# Patient Record
Sex: Male | Born: 1945
Health system: Southern US, Community
[De-identification: ages and names within clinical notes are randomized; demographics above are authoritative.]

## PROBLEM LIST (undated history)

## (undated) DIAGNOSIS — I10 Essential (primary) hypertension: Secondary | ICD-10-CM

## (undated) DIAGNOSIS — S32010A Wedge compression fracture of first lumbar vertebra, initial encounter for closed fracture: Secondary | ICD-10-CM

## (undated) DIAGNOSIS — L814 Other melanin hyperpigmentation: Secondary | ICD-10-CM

## (undated) DIAGNOSIS — T7840XA Allergy, unspecified, initial encounter: Secondary | ICD-10-CM

## (undated) DIAGNOSIS — L570 Actinic keratosis: Secondary | ICD-10-CM

## (undated) DIAGNOSIS — R569 Unspecified convulsions: Secondary | ICD-10-CM

## (undated) DIAGNOSIS — B019 Varicella without complication: Secondary | ICD-10-CM

## (undated) DIAGNOSIS — S22000A Wedge compression fracture of unspecified thoracic vertebra, initial encounter for closed fracture: Secondary | ICD-10-CM

## (undated) HISTORY — DX: Actinic keratosis: L57.0

## (undated) HISTORY — PX: COLONOSCOPY: SHX174

## (undated) HISTORY — DX: Unspecified convulsions: R56.9

## (undated) HISTORY — DX: Allergy, unspecified, initial encounter: T78.40XA

## (undated) HISTORY — DX: Other melanin hyperpigmentation: L81.4

## (undated) HISTORY — DX: Essential (primary) hypertension: I10

## (undated) HISTORY — DX: Wedge compression fracture of first lumbar vertebra, initial encounter for closed fracture: S32.010A

## (undated) HISTORY — PX: OTHER SURGICAL HISTORY: SHX169

## (undated) HISTORY — DX: Wedge compression fracture of unspecified thoracic vertebra, initial encounter for closed fracture: S22.000A

## (undated) HISTORY — DX: Varicella without complication: B01.9

---

## 2009-11-28 ENCOUNTER — Ambulatory Visit: Payer: Self-pay | Admitting: Gastroenterology

## 2012-02-03 ENCOUNTER — Emergency Department: Payer: Self-pay | Admitting: Emergency Medicine

## 2012-02-03 LAB — URINALYSIS, COMPLETE
Glucose,UR: NEGATIVE mg/dL (ref 0–75)
Ketone: NEGATIVE
Leukocyte Esterase: NEGATIVE
Nitrite: NEGATIVE
Ph: 5 (ref 4.5–8.0)
Protein: NEGATIVE
Specific Gravity: 1.015 (ref 1.003–1.030)
WBC UR: <1 /HPF (ref 0–5)

## 2012-02-03 LAB — COMPREHENSIVE METABOLIC PANEL
Albumin: 3.9 g/dL (ref 3.4–5.0)
Anion Gap: 7 (ref 7–16)
BUN: 18 mg/dL (ref 7–18)
Bilirubin,Total: 0.5 mg/dL (ref 0.2–1.0)
Chloride: 102 mmol/L (ref 98–107)
Co2: 27 mmol/L (ref 21–32)
Creatinine: 0.9 mg/dL (ref 0.60–1.30)
EGFR (African American): >60
Potassium: 3.8 mmol/L (ref 3.5–5.1)
SGPT (ALT): 31 U/L (ref 12–78)
Sodium: 136 mmol/L (ref 136–145)
Total Protein: 6.7 g/dL (ref 6.4–8.2)

## 2012-02-03 LAB — CBC
Platelet: 211 10*3/uL (ref 150–440)
RBC: 4.42 10*6/uL (ref 4.40–5.90)
RDW: 12.8 % (ref 11.5–14.5)
WBC: 5.7 10*3/uL (ref 3.8–10.6)

## 2015-09-16 ENCOUNTER — Observation Stay (HOSPITAL_COMMUNITY): Payer: Medicare Other

## 2015-09-16 ENCOUNTER — Observation Stay (HOSPITAL_COMMUNITY)
Admission: EM | Admit: 2015-09-16 | Discharge: 2015-09-16 | Disposition: A | Discharge status: Home / Self Care | Payer: Medicare Other | Attending: Emergency Medicine | Admitting: Emergency Medicine

## 2015-09-16 ENCOUNTER — Encounter (HOSPITAL_COMMUNITY): Payer: Self-pay | Admitting: *Deleted

## 2015-09-16 ENCOUNTER — Emergency Department (HOSPITAL_COMMUNITY): Payer: Medicare Other

## 2015-09-16 DIAGNOSIS — IMO0002 Reserved for concepts with insufficient information to code with codable children: Secondary | ICD-10-CM

## 2015-09-16 DIAGNOSIS — Z79899 Other long term (current) drug therapy: Secondary | ICD-10-CM | POA: Diagnosis not present

## 2015-09-16 DIAGNOSIS — R569 Unspecified convulsions: Principal | ICD-10-CM | POA: Insufficient documentation

## 2015-09-16 DIAGNOSIS — Z7982 Long term (current) use of aspirin: Secondary | ICD-10-CM | POA: Insufficient documentation

## 2015-09-16 DIAGNOSIS — G40901 Epilepsy, unspecified, not intractable, with status epilepticus: Secondary | ICD-10-CM | POA: Diagnosis present

## 2015-09-16 LAB — I-STAT CHEM 8, ED
BUN: 16 mg/dL (ref 6–20)
Calcium, Ion: 1.14 mmol/L (ref 1.12–1.23)
Chloride: 103 mmol/L (ref 101–111)
Creatinine, Ser: 1.2 mg/dL (ref 0.61–1.24)
GLUCOSE: 172 mg/dL — AB (ref 65–99)
HEMATOCRIT: 48 % (ref 39.0–52.0)
HEMOGLOBIN: 16.3 g/dL (ref 13.0–17.0)
POTASSIUM: 3.9 mmol/L (ref 3.5–5.1)
SODIUM: 139 mmol/L (ref 135–145)
TCO2: 11 mmol/L (ref 0–100)

## 2015-09-16 LAB — COMPREHENSIVE METABOLIC PANEL
ALT: 35 U/L (ref 17–63)
ANION GAP: 23 — AB (ref 5–15)
AST: 39 U/L (ref 15–41)
Albumin: 4.2 g/dL (ref 3.5–5.0)
Alkaline Phosphatase: 85 U/L (ref 38–126)
BUN: 16 mg/dL (ref 6–20)
CHLORIDE: 102 mmol/L (ref 101–111)
CO2: 9 mmol/L — AB (ref 22–32)
CREATININE: 1.34 mg/dL — AB (ref 0.61–1.24)
Calcium: 9 mg/dL (ref 8.9–10.3)
GFR calc non Af Amer: 52 mL/min — ABNORMAL LOW (ref >60)
GFR, EST AFRICAN AMERICAN: 60 mL/min — AB (ref >60)
Glucose, Bld: 174 mg/dL — ABNORMAL HIGH (ref 65–99)
Potassium: 3.8 mmol/L (ref 3.5–5.1)
SODIUM: 134 mmol/L — AB (ref 135–145)
Total Bilirubin: 0.9 mg/dL (ref 0.3–1.2)
Total Protein: 7 g/dL (ref 6.5–8.1)

## 2015-09-16 LAB — URINE MICROSCOPIC-ADD ON: Squamous Epithelial / LPF: NONE SEEN

## 2015-09-16 LAB — CBC WITH DIFFERENTIAL/PLATELET
Basophils Absolute: 0.1 10*3/uL (ref 0.0–0.1)
Basophils Relative: 1 %
Eosinophils Absolute: 0.4 10*3/uL (ref 0.0–0.7)
Eosinophils Relative: 4 %
HEMATOCRIT: 46.2 % (ref 39.0–52.0)
HEMOGLOBIN: 15.5 g/dL (ref 13.0–17.0)
LYMPHS ABS: 4.2 10*3/uL — AB (ref 0.7–4.0)
Lymphocytes Relative: 41 %
MCH: 32.8 pg (ref 26.0–34.0)
MCHC: 33.5 g/dL (ref 30.0–36.0)
MCV: 97.9 fL (ref 78.0–100.0)
MONOS PCT: 9 %
Monocytes Absolute: 0.9 10*3/uL (ref 0.1–1.0)
NEUTROS ABS: 4.8 10*3/uL (ref 1.7–7.7)
NEUTROS PCT: 45 %
Platelets: 266 10*3/uL (ref 150–400)
RBC: 4.72 MIL/uL (ref 4.22–5.81)
RDW: 12.8 % (ref 11.5–15.5)
WBC: 10.4 10*3/uL (ref 4.0–10.5)

## 2015-09-16 LAB — URINALYSIS, ROUTINE W REFLEX MICROSCOPIC
BILIRUBIN URINE: NEGATIVE
Glucose, UA: NEGATIVE mg/dL
Ketones, ur: NEGATIVE mg/dL
Leukocytes, UA: NEGATIVE
Nitrite: NEGATIVE
PROTEIN: 100 mg/dL — AB
Specific Gravity, Urine: 1.025 (ref 1.005–1.030)
pH: 5 (ref 5.0–8.0)

## 2015-09-16 LAB — RAPID URINE DRUG SCREEN, HOSP PERFORMED
Amphetamines: NOT DETECTED
BARBITURATES: NOT DETECTED
Benzodiazepines: POSITIVE — AB
Cocaine: NOT DETECTED
Opiates: NOT DETECTED
TETRAHYDROCANNABINOL: NOT DETECTED

## 2015-09-16 LAB — PROTIME-INR
INR: 1.2 (ref 0.00–1.49)
Prothrombin Time: 15.4 seconds — ABNORMAL HIGH (ref 11.6–15.2)

## 2015-09-16 LAB — CBG MONITORING, ED: GLUCOSE-CAPILLARY: 186 mg/dL — AB (ref 65–99)

## 2015-09-16 MED ORDER — NAPROXEN 500 MG PO TABS: 500.0000 mg | ORAL_TABLET | Freq: Two times a day (BID) | ORAL | Status: DC

## 2015-09-16 MED ORDER — HYDROCODONE-ACETAMINOPHEN 5-325 MG PO TABS: 1.0000 | ORAL_TABLET | Freq: Four times a day (QID) | ORAL | Status: DC | PRN

## 2015-09-16 MED ORDER — IOPAMIDOL (ISOVUE-370) INJECTION 76%
INTRAVENOUS | Status: AC
  Administered 2015-09-16: 50 mL
  Filled 2015-09-16: qty 50

## 2015-09-16 MED ORDER — ETOMIDATE 2 MG/ML IV SOLN
10.0000 mg | Freq: Once | INTRAVENOUS | Status: AC
Start: 2015-09-16 — End: 2015-09-16
  Administered 2015-09-16: 10 mg via INTRAVENOUS

## 2015-09-16 MED ORDER — LEVETIRACETAM 500 MG PO TABS: 500.0000 mg | ORAL_TABLET | Freq: Two times a day (BID) | ORAL | Status: DC

## 2015-09-16 MED ORDER — LORAZEPAM 2 MG/ML IJ SOLN
1.0000 mg | Freq: Once | INTRAMUSCULAR | Status: AC
  Administered 2015-09-16: 1 mg via INTRAVENOUS

## 2015-09-16 MED ORDER — FENTANYL CITRATE (PF) 100 MCG/2ML IJ SOLN
50.0000 ug | Freq: Once | INTRAMUSCULAR | Status: AC
  Administered 2015-09-16: 50 ug via INTRAVENOUS
  Filled 2015-09-16: qty 2

## 2015-09-16 MED ORDER — GADOBENATE DIMEGLUMINE 529 MG/ML IV SOLN
19.0000 mL | Freq: Once | INTRAVENOUS | Status: AC | PRN
  Administered 2015-09-16: 19 mL via INTRAVENOUS

## 2015-09-16 MED ORDER — SODIUM CHLORIDE 0.9 % IV SOLN
1000.0000 mg | Freq: Two times a day (BID) | INTRAVENOUS | Status: DC
  Administered 2015-09-16: 1000 mg via INTRAVENOUS
  Filled 2015-09-16 (×2): qty 10

## 2015-09-16 MED ORDER — LORAZEPAM 2 MG/ML IJ SOLN
INTRAMUSCULAR | Status: AC
  Filled 2015-09-16: qty 1

## 2015-09-16 NOTE — ED Provider Notes (Addendum)
CSN: [id number]     Arrival date & time 09/16/15  0906 History   First MD Initiated Contact with Patient 09/16/15 [phone]     Chief Complaint  Patient presents with  . Seizures     (Consider location/radiation/quality/duration/timing/severity/associated sxs/prior Treatment) HPI Comments: LEVEL 5 CAVEAT FOR ALTERED MENTAL STATUS / UNRESPONSIVE  Pt brought to the ER with cc of unresponsive. Per EMS, they were called to the scene after patient fell down and had a seizure like episode. When they arrived pt was verbally responsive, but altered. Per EMS, there was 1 min episode of generalized tonic clonic seizure which stopped spontaneously. Pt was given 2.5 mg versed.  Care everywhere shows primary dx of brain AN at [facility] as a diagnosis - but there is no note in regards to that diagnosis or an image we can confirm the diagnosis with.   Patient is a 70 y.o. male presenting with seizures. The history is limited by the condition of the patient.  Seizures Seizure activity on arrival: no     History reviewed. No pertinent past medical history. No past surgical history on file. No family history on file. Social History  Substance Use Topics  . Smoking status: None  . Smokeless tobacco: None  . Alcohol Use: None    Review of Systems  Unable to perform ROS: Patient unresponsive  Neurological: Positive for seizures.      Allergies  Other  Home Medications   Prior to Admission medications   Medication Sig Start Date End Date Taking? Authorizing Provider  amLODipine (NORVASC) 5 MG tablet Take 5 mg by mouth daily. 09/16/15  Yes Historical Provider, MD  aspirin EC 81 MG tablet Take 81 mg by mouth daily.   Yes Historical Provider, MD  Multiple Vitamin (MULTIVITAMIN WITH MINERALS) TABS tablet Take 1 tablet by mouth See admin instructions. Take 1 tablet by mouth every 2-3 days   Yes Historical Provider, MD   BP 142/91 mmHg  Pulse 97  Temp(Src) 97.9 F (36.6 C) (Rectal)  Resp 26  Wt 190  lb (86.183 kg)  SpO2 95% Physical Exam  Constitutional: He appears well-developed.  HENT:  Head: Atraumatic.  Eyes: Conjunctivae are normal.  Neck: Neck supple.  Cardiovascular:  tachycardia  Pulmonary/Chest: Effort normal.  Abdominal: Soft.  Neurological:  Not following commands GCS - (e/v/m = 1/2/5) = 7. Protecting airway - mumbling   Skin: Skin is warm.  Nursing note and vitals reviewed.   ED Course  .Critical Care Performed by: Varney Biles Authorized by: Varney Biles Total critical care time: 40 minutes Critical care time was exclusive of separately billable procedures and treating other patients. Critical care was necessary to treat or prevent imminent or life-threatening deterioration of the following conditions: CNS failure or compromise. Critical care was time spent personally by me on the following activities: discussions with consultants, development of treatment plan with patient or surrogate, evaluation of patient's response to treatment, examination of patient, obtaining history from patient or surrogate, ordering and performing treatments and interventions, ordering and review of laboratory studies, ordering and review of radiographic studies, pulse oximetry, re-evaluation of patient's condition and review of old charts.   (including critical care time) Labs Review Labs Reviewed  COMPREHENSIVE METABOLIC PANEL - Abnormal; Notable for the following:    Sodium 134 (*)    CO2 9 (*)    Glucose, Bld 174 (*)    Creatinine, Ser 1.34 (*)    GFR calc non Af Amer 52 (*)  GFR calc Af Amer 60 (*)    Anion gap 23 (*)    All other components within normal limits  CBC WITH DIFFERENTIAL/PLATELET - Abnormal; Notable for the following:    Lymphs Abs 4.2 (*)    All other components within normal limits  PROTIME-INR - Abnormal; Notable for the following:    Prothrombin Time 15.4 (*)    All other components within normal limits  I-STAT CHEM 8, ED - Abnormal; Notable  for the following:    Glucose, Bld 172 (*)    All other components within normal limits  CBG MONITORING, ED - Abnormal; Notable for the following:    Glucose-Capillary 186 (*)    All other components within normal limits  URINE RAPID DRUG SCREEN, HOSP PERFORMED  URINALYSIS, ROUTINE W REFLEX MICROSCOPIC (NOT AT [facility])    Imaging Review Ct Angio Head W/cm &/or Wo Cm  09/16/2015  CLINICAL DATA:  New onset seizure.  Altered mental status. EXAM: CT ANGIOGRAPHY HEAD TECHNIQUE: Multidetector CT imaging of the head was performed using the standard protocol during bolus administration of intravenous contrast. Multiplanar CT image reconstructions and MIPs were obtained to evaluate the vascular anatomy. CONTRAST:  50 mL Isovue 370 COMPARISON:  CT head without contrast from the same day. FINDINGS: CT HEAD Brain: An age indeterminate lacunar infarct is present in the right thalamus. No other acute cortical infarct is present. Mild periventricular white matter hypoattenuation is present bilaterally. Calvarium and skull base: The calvarium is within normal limits. Skullbase is unremarkable. Paranasal sinuses: Mild mucosal thickening is present in the maxillary sinuses, worse on the right. The remaining paranasal sinuses and the mastoid air cells are clear. Orbits: The globes and orbits are intact. CTA HEAD Anterior circulation: Minimal atherosclerotic calcifications are present within the cavernous internal carotid arteries bilaterally. There is no significant stenosis. No focal aneurysm is present. The ICA termini are within normal limits bilaterally. The A1 and M1 segments are normal. The anterior communicating artery is patent. The MCA bifurcations are within normal limits bilaterally. ACA and MCA branch vessels are unremarkable. Posterior circulation: The vertebral arteries are codominant. The PICA origins are visualized and normal. The basilar artery is within normal limits. Both posterior cerebral arteries  originate from the basilar tip. A prominent right posterior communicating artery is present as well. PCA branch vessels are within normal limits bilaterally. Venous sinuses: The dural sinuses are patent. The right transverse sinus is dominant. Straight sinus and deep cerebral veins are intact. Cortical veins are unremarkable. Anatomic variants: A prominent right posterior communicating artery contributes to the right PCA. Delayed phase:The postcontrast images demonstrate no pathologic enhancement. IMPRESSION: 1. Age indeterminate lacunar infarct of the right thalamus. 2. Stable mild age advanced white matter disease likely reflects the sequela of chronic microvascular ischemia. 3. Normal variant CTA circle of Willis without significant proximal stenosis, aneurysm, or branch vessel occlusion. Electronically Signed   By: San Morelle M.D.   On: 09/16/2015 11:30   Ct Head Wo Contrast  09/16/2015  CLINICAL DATA:  New onset seizure. EXAM: CT HEAD WITHOUT CONTRAST TECHNIQUE: Contiguous axial images were obtained from the base of the skull through the vertex without intravenous contrast. COMPARISON:  CT head without contrast 02/03/2012 and CTA head from the same day. FINDINGS: An age indeterminate lacunar infarct is present in the right thalamus. Mild periventricular white matter hypoattenuation is otherwise stable. The basal ganglia are otherwise intact. The insular ribbon is within normal limits bilaterally. No other focal cortical lesions are present.  No acute hemorrhage or mass lesion is present. Mild atrophy is within normal limits for age. The ventricles are proportionate to the degree of atrophy. No significant extra-axial fluid collection is present. A right nasal airway is in place. The calvarium is intact. The skullbase is otherwise normal. No significant extracranial soft tissue lesions are present. The globes and orbits are within normal limits. IMPRESSION: 1. Age indeterminate nonhemorrhagic infarct  of the right thalamus. 2. Stable mild age advanced white matter disease likely reflects the sequela of chronic microvascular ischemia. 3. No other acute intracranial abnormality. 4. Mild atrophy is within normal limits for age. Electronically Signed   By: San Morelle M.D.   On: 09/16/2015 11:32   I have personally reviewed and evaluated these images and lab results as part of my medical decision-making.   EKG Interpretation   Date/Time:  Saturday September 16 2015 09:11:41 EDT Ventricular Rate:  112 PR Interval:    QRS Duration: 109 QT Interval:  336 QTC Calculation: 459 R Axis:   -29 Text Interpretation:  Sinus tachycardia Multiform ventricular premature  complexes Borderline left axis deviation No acute changes No old tracing  to compare Confirmed by Glenroy Crossen, MD, Thelma Comp [phone]) on 09/16/2015 9:21:08  AM      MDM   Final diagnoses:  Status epilepticus ([place])    Pt unresponsive, witnessed seizure x 2 that happened post fall. Pt is status. ? Bleed or stroke. Brain AN listed in family hx for sure. We will get Ct angio head and reassess. Close monitoring for now.  @12 :00: CT -A is neg. Son and bedside and pt more awake. Looks like pt has had some partial seizure like symptoms in the past. Neurology recommends admission, and they will see the patient before making any recommendations.     Varney Biles, MD 09/16/15 [place], MD 09/16/15 1306

## 2015-09-16 NOTE — Discharge Instructions (Signed)
See the Neurologist as requested. EEG still needs to be completed. Take the keppra as prescribed. No driving for 6 months.  You also have compression fracture of the back. Please wear the brace. Please take the meds prescribed for pain. See your doctor, or back specialist (see referral) for follow up - contact your doctors office this Monday to discuss follow up plan with them.   MRI IMPRESSION: No acute intracranial process.  Mild to moderate white matter changes compatible chronic small vessel ischemic disease. 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        Epilepsy Epilepsy is a disorder in which a person has repeated seizures over time. A seizure is a release of abnormal electrical activity in the brain. Seizures can cause a change in attention, behavior, or the ability to remain awake and alert (altered mental status). Seizures often involve uncontrollable shaking (convulsions).  Most people with epilepsy lead normal lives. However, people with epilepsy are at an increased risk of falls, accidents, and injuries. Therefore, it is important to begin treatment right away. CAUSES  Epilepsy has many possible causes. Anything that disturbs the normal pattern of brain cell activity can lead to seizures. This may include:   Head injury.  Birth trauma.  High fever as a child.  Stroke.  Bleeding into or around the brain.  Certain drugs.  Prolonged low oxygen, such as what occurs after CPR efforts.  Abnormal brain development.  Certain illnesses, such as meningitis, encephalitis (brain infection), malaria, and other infections.  An imbalance of nerve signaling chemicals (neurotransmitters).  SIGNS AND SYMPTOMS  The symptoms of a seizure can vary greatly from one person to another. Right before a seizure, you may have a warning (aura) that a seizure is about to occur. An aura may include the following symptoms:  Fear or anxiety.  Nausea.  Feeling like  the room is spinning (vertigo).  Vision changes, such as seeing flashing lights or spots. Common symptoms during a seizure include:  Abnormal sensations, such as an abnormal smell or a bitter taste in the mouth.   Sudden, general body stiffness.   Convulsions that involve rhythmic jerking of the face, arm, or leg on one or both sides.   Sudden change in consciousness.   Appearing to be awake but not responding.   Appearing to be asleep but cannot be awakened.   Grimacing, chewing, lip smacking, drooling, tongue biting, or loss of bowel or bladder control. After a seizure, you may feel sleepy for a while. DIAGNOSIS  Your health care provider will ask about your symptoms and take a medical history. Descriptions from any witnesses to your seizures will be very helpful in the diagnosis. A physical exam, including a detailed neurological exam, is necessary. Various tests may be done, such as:   An electroencephalogram (EEG). This is a painless test of your brain waves. In this test, a diagram is created of your brain waves. These diagrams can be interpreted by a specialist.  An MRI of the brain.   A CT scan of the brain.   A spinal tap (lumbar puncture, LP).  Blood tests to check for signs of infection or abnormal blood chemistry. TREATMENT  There is no cure for epilepsy, but it is generally treatable. Once epilepsy is diagnosed, it is important to begin treatment as soon as possible. For most people with epilepsy, seizures can be controlled with medicines. The following may also be used:  A pacemaker for the brain (vagus nerve  stimulator) can be used for people with seizures that are not well controlled by medicine.  Surgery on the brain. For some people, epilepsy eventually goes away. HOME CARE INSTRUCTIONS   Follow your health care provider's recommendations on driving and safety in normal activities.  Get enough rest. Lack of sleep can cause seizures.  Only take  over-the-counter or prescription medicines as directed by your health care provider. Take any prescribed medicine exactly as directed.  Avoid any known triggers of your seizures.  Keep a seizure diary. Record what you recall about any seizure, especially any possible trigger.   Make sure the people you live and work with know that you are prone to seizures. They should receive instructions on how to help you. In general, a witness to a seizure should:   Cushion your head and body.   Turn you on your side.   Avoid unnecessarily restraining you.   Not place anything inside your mouth.   Call for emergency medical help if there is any question about what has occurred.   Follow up with your health care provider as directed. You may need regular blood tests to monitor the levels of your medicine.  SEEK MEDICAL CARE IF:   You develop signs of infection or other illness. This might increase the risk of a seizure.   You seem to be having more frequent seizures.   Your seizure pattern is changing.  SEEK IMMEDIATE MEDICAL CARE IF:   You have a seizure that does not stop after a few moments.   You have a seizure that causes any difficulty in breathing.   You have a seizure that results in a very severe headache.   You have a seizure that leaves you with the inability to speak or use a part of your body.    This information is not intended to replace advice given to you by your health care provider. Make sure you discuss any questions you have with your health care provider.   Document Released: 02/18/2005 Document Revised: 12/09/2012 Document Reviewed: 09/30/2012 Elsevier Interactive Patient Education 2016 [place]. Spinal Compression Fracture A spinal compression fracture is a collapse of the bones that form the spine (vertebrae). With this type of fracture, the vertebrae become squashed (compressed) into a wedge shape. Most compression fractures happen in the  middle or lower part of the spine. CAUSES This condition may be caused by:  Thinning and loss of density in the bones (osteoporosis). This is the most common cause.  A fall.  A car or motorcycle accident.  Cancer.  Trauma, such as a heavy, direct hit to the head. RISK FACTORS You may be at greater risk for a spinal compression fracture if you:  Are 32 years old or older.  Have osteoporosis.  Have certain types of cancer, including:  Multiple myeloma.  Lymphoma.  Prostate cancer.  Lung cancer.  Breast cancer. SYMPTOMS Symptoms of this condition include:  Severe pain.  Pain that gets worse over time.  Pain that is worse when you stand, walk, sit, or bend.  Sudden pain that is so bad that it is hard for you to move.  Bending or humping of the spine.  Gradual loss of height.  Numbness, tingling, or weakness in the back and legs.  Trouble walking. Your symptoms will depend on the cause of the fracture and how quickly it develops. For example, fractures that are caused by osteoporosis can cause few symptoms, no symptoms, or symptoms that develop slowly over  time. DIAGNOSIS This condition may be diagnosed based on symptoms, medical history, and a physical exam. During the physical exam, your health care provider may tap along the length of your spine to check for tenderness. Tests may be done to confirm the diagnosis. They may include:  A bone density test to check for osteoporosis.  Imaging tests, such as a spine X-ray, a CT scan, or MRI. TREATMENT Treatment for this condition depends on the cause and severity of the condition.Some fractures, such as those that are caused by osteoporosis, may heal on their own with supportive care. This may include:  Pain medicine.  Rest.  A back brace.  Physical therapy exercises.  Medicine that reduces bone pain.  Calcium and vitamin D supplements. Fractures that cause the back to become misshapen, cause nerve pain  or weakness, or do not respond to other treatment may be treated with a surgical procedure, such as:  Vertebroplasty. In this procedure, bone cement is injected into the collapsed vertebrae to stabilize them.  Balloon kyphoplasty. In this procedure, the collapsed vertebrae are expanded with a balloon and then bone cement is injected into them.  Spinal fusion. In this procedure, the collapsed vertebrae are connected (fused) to normal vertebrae. HOME CARE INSTRUCTIONS General Instructions  Take medicines only as directed by your health care provider.  Do not drive or operate heavy machinery while taking pain medicine.  If directed, apply ice to the injured area:  Put ice in a plastic bag.  Place a towel between your skin and the bag.  Leave the ice on for 30 minutes every two hours at first. Then apply the ice as needed.  Wear your neck brace or back brace as directed by your health care provider.  Do not drink alcohol. Alcohol can interfere with your treatment.  Keep all follow-up visits as directed by your health care provider. This is important. It can help to prevent permanent injury, disability, and long-lasting (chronic) pain. Activity  Stay in bed (on bed rest) only as directed by your health care provider. Being on bed rest for too long can make your condition worse.  Return to your normal activities as directed by your health care provider. Ask what activities are safe for you.  Do exercises to improve motion and strength in your back (physical therapy), as recommended by your health care provider.  Exercise regularly as directed by your health care provider. SEEK MEDICAL CARE IF:  You have a fever.  You develop a cough that makes your pain worse.  Your pain medicine is not helping.  Your pain does not get better over time.  You cannot return to your normal activities as planned or expected. SEEK IMMEDIATE MEDICAL CARE IF:  Your pain is very bad and it suddenly  gets worse.  You are unable to move any body part (paralysis) that is below the level of your injury.  You have numbness, tingling, or weakness in any body part that is below the level of your injury.  You cannot control your bladder or bowels.   This information is not intended to replace advice given to you by your health care provider. Make sure you discuss any questions you have with your health care provider.   Document Released: 02/18/2005 Document Revised: 07/05/2014 Document Reviewed: 02/22/2014 Elsevier Interactive Patient Education [employer].
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
## 2015-09-16 NOTE — ED Notes (Signed)
Pt assisted to side of bed, became nauseous and diaphoretic. Symptoms subsided after couple minutes. Declines need for nausea medication. Pt and family also report missing shoes and a shirt, security contacted for lost items report
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
## 2015-09-16 NOTE — ED Notes (Signed)
Family requesting an orthopedic or neurosugery referral for compression fractures; Spoke to Dr.Steinl who gave referral

## 2015-09-16 NOTE — ED Notes (Signed)
Neuro rounding in pt's room.

## 2015-09-20 ENCOUNTER — Telehealth: Payer: Self-pay | Admitting: Neurology

## 2015-09-20 NOTE — Telephone Encounter (Signed)
LFt vm for patients son that per Dr. Leonie Man he would have to contact his PCP for pain management. Pt was given 8 pills of norco on 09/16/2015. Pt has appt tomorrow at 0400pm for new pt appt.

## 2015-09-20 NOTE — Telephone Encounter (Signed)
Pt's son, Collene Leyden requesting refill on HYDROcodone-acetaminophen (NORCO/VICODIN) 5-325 MG tablet in order to make the drive in for appt tomorrow. Please call and advise

## 2015-09-21 ENCOUNTER — Encounter: Payer: Self-pay | Admitting: Neurology

## 2015-09-21 ENCOUNTER — Ambulatory Visit (INDEPENDENT_AMBULATORY_CARE_PROVIDER_SITE_OTHER): Payer: Medicare Other | Admitting: Neurology

## 2015-09-21 VITALS — BP 128/95 | HR 92 | Ht 73.0 in | Wt 205.6 lb

## 2015-09-21 DIAGNOSIS — G40209 Localization-related (focal) (partial) symptomatic epilepsy and epileptic syndromes with complex partial seizures, not intractable, without status epilepticus: Secondary | ICD-10-CM | POA: Diagnosis not present

## 2015-09-21 NOTE — Patient Instructions (Signed)
I had a long discussion with the patient and his son regarding his recent episodes of 2 generalized seizures, discussed risk for seizure recurrence and answered questions. I recommend he stay on Keppra 500 mg twice daily. If he has breakthrough complex partial seizures may need to consider increasing the dose. I have discussed possible side effects and advised him to call me if needed. We will follow EEG results from study done today. Patient was counseled not to drive for the next 3-6 months as per [facility] and to avoid seizure provoking stimuli like sleep deprivation, extremes of physical activity and changes in diet. He was advised to follow-up with the pain management doctor for his pain and return for follow-up in 3 months  Epilepsy Epilepsy is a disorder in which a person has repeated seizures over time. A seizure is a release of abnormal electrical activity in the brain. Seizures can cause a change in attention, behavior, or the ability to remain awake and alert (altered mental status). Seizures often involve uncontrollable shaking (convulsions).  Most people with epilepsy lead normal lives. However, people with epilepsy are at an increased risk of falls, accidents, and injuries. Therefore, it is important to begin treatment right away. CAUSES  Epilepsy has many possible causes. Anything that disturbs the normal pattern of brain cell activity can lead to seizures. This may include:   Head injury.  Birth trauma.  High fever as a child.  Stroke.  Bleeding into or around the brain.  Certain drugs.  Prolonged low oxygen, such as what occurs after CPR efforts.  Abnormal brain development.  Certain illnesses, such as meningitis, encephalitis (brain infection), malaria, and other infections.  An imbalance of nerve signaling chemicals (neurotransmitters).  SIGNS AND SYMPTOMS  The symptoms of a seizure can vary greatly from one person to another. Right before a seizure, you may  have a warning (aura) that a seizure is about to occur. An aura may include the following symptoms:  Fear or anxiety.  Nausea.  Feeling like the room is spinning (vertigo).  Vision changes, such as seeing flashing lights or spots. Common symptoms during a seizure include:  Abnormal sensations, such as an abnormal smell or a bitter taste in the mouth.   Sudden, general body stiffness.   Convulsions that involve rhythmic jerking of the face, arm, or leg on one or both sides.   Sudden change in consciousness.   Appearing to be awake but not responding.   Appearing to be asleep but cannot be awakened.   Grimacing, chewing, lip smacking, drooling, tongue biting, or loss of bowel or bladder control. After a seizure, you may feel sleepy for a while. DIAGNOSIS  Your health care provider will ask about your symptoms and take a medical history. Descriptions from any witnesses to your seizures will be very helpful in the diagnosis. A physical exam, including a detailed neurological exam, is necessary. Various tests may be done, such as:   An electroencephalogram (EEG). This is a painless test of your brain waves. In this test, a diagram is created of your brain waves. These diagrams can be interpreted by a specialist.  An MRI of the brain.   A CT scan of the brain.   A spinal tap (lumbar puncture, LP).  Blood tests to check for signs of infection or abnormal blood chemistry. TREATMENT  There is no cure for epilepsy, but it is generally treatable. Once epilepsy is diagnosed, it is important to begin treatment as soon as possible.  For most people with epilepsy, seizures can be controlled with medicines. The following may also be used:  A pacemaker for the brain (vagus nerve stimulator) can be used for people with seizures that are not well controlled by medicine.  Surgery on the brain. For some people, epilepsy eventually goes away. HOME CARE INSTRUCTIONS   Follow your  health care provider's recommendations on driving and safety in normal activities.  Get enough rest. Lack of sleep can cause seizures.  Only take over-the-counter or prescription medicines as directed by your health care provider. Take any prescribed medicine exactly as directed.  Avoid any known triggers of your seizures.  Keep a seizure diary. Record what you recall about any seizure, especially any possible trigger.   Make sure the people you live and work with know that you are prone to seizures. They should receive instructions on how to help you. In general, a witness to a seizure should:   Cushion your head and body.   Turn you on your side.   Avoid unnecessarily restraining you.   Not place anything inside your mouth.   Call for emergency medical help if there is any question about what has occurred.   Follow up with your health care provider as directed. You may need regular blood tests to monitor the levels of your medicine.  SEEK MEDICAL CARE IF:   You develop signs of infection or other illness. This might increase the risk of a seizure.   You seem to be having more frequent seizures.   Your seizure pattern is changing.  SEEK IMMEDIATE MEDICAL CARE IF:   You have a seizure that does not stop after a few moments.   You have a seizure that causes any difficulty in breathing.   You have a seizure that results in a very severe headache.   You have a seizure that leaves you with the inability to speak or use a part of your body.    This information is not intended to replace advice given to you by your health care provider. Make sure you discuss any questions you have with your health care provider.   Document Released: 02/18/2005 Document Revised: 12/09/2012 Document Reviewed: 09/30/2012 Elsevier Interactive Patient Education [employer].

## 2015-09-21 NOTE — Progress Notes (Signed)
[facility] [address] [place]. [place] [address] [phone]       OFFICE CONSULT NOTE  [name] Date of Birth:  November 05, 1945 Medical Record Number:  [id number]   Referring MD:  Varney Biles  Reason for Referral:  Seizures  HPI: Mr Samuel Mayo is a 70 year old Caucasian male who had 2 witnessed episodes of generalized tonic-clonic seizures on 09/16/15. Lives in [place] and was visiting his girlfriend in [place] when apparently he made a guttural sound and girlfriend who was cooking turnaround and found him to be slumping down in the chair. She tried to help him but he fell on the floor. He was unresponsive for several minutes. She called 911 and by the time that arrived he initially was confused and disoriented but had a second witnessed generalized tonic-clonic seizure in the ambulance. He was treated with medications and taken to [facility] emergency room where he gradually regained consciousness and returned back to his baseline over a couple of hours. Patient does not have a good recollection of the events of that day. He had CT scan of the head which was unremarkable. He also had MRI scan of the brain subsequently which showed no acute abnormality but old small right basal ganglia and left thalamic remote age lacunar infarcts. I personally reviewed the films. He was seen in consultation with neurologist Dr. Elson Clan who l started him on Keppra 500 mg twice daily. The patient's son who is accompanying him today states that he had noticed a previous records episode when he was staring briefly unresponsive for less than a minute but yet able to function. One of these episodes occurred while he was driving. Patient was never evaluated for these episodes in the past. He had an outpatient EEG performed in [place] today  at [facility] but I do not have the results. Patient has been able to tolerate Keppra well without significant side effects. He is  bothered by back pain from compression fracture is sustained with a seizure. He is currently wearing a back brace and plans to see her pain specialist for this soon. He denies any prior history of known seizures, significant head injury with loss of consciousness, febrile seizures, there is no family history of epilepsy. He has no known prior history of strokes TIAs seizures or head injury.  ROS:   14 system review of systems is positive for seizure, loss of consciousness, back pain, compression fracture and all other systems negative  PMH:  Past Medical History  Diagnosis Date  . Seizures ([place])     Social History:  Social History   Social History  . Marital Status: Married    Spouse Name: N/A  . Number of Children: N/A  . Years of Education: N/A   Occupational History  . Not on file.   Social History Main Topics  . Smoking status: Never Smoker   . Smokeless tobacco: Not on file  . Alcohol Use: 0.6 oz/week    1 Cans of beer per week     Comment: socially  . Drug Use: Not on file  . Sexual Activity: Not on file   Other Topics Concern  . Not on file   Social History Narrative    Medications:   Current Outpatient Prescriptions on File Prior to Visit  Medication Sig Dispense Refill  . amLODipine (NORVASC) 5 MG tablet Take 5 mg by mouth daily.    Marland Kitchen aspirin EC 81 MG tablet Take 81 mg by mouth daily.    Marland Kitchen  HYDROcodone-acetaminophen (NORCO/VICODIN) 5-325 MG tablet Take 1 tablet by mouth every 6 (six) hours as needed. 8 tablet 0  . levETIRAcetam (KEPPRA) 500 MG tablet Take 1 tablet (500 mg total) by mouth 2 (two) times daily. 60 tablet 0  . Multiple Vitamin (MULTIVITAMIN WITH MINERALS) TABS tablet Take 1 tablet by mouth See admin instructions. Take 1 tablet by mouth every 2-3 days    . naproxen (NAPROSYN) 500 MG tablet Take 1 tablet (500 mg total) by mouth 2 (two) times daily. 30 tablet 0   No current facility-administered medications on file prior to visit.    Allergies:     Allergies  Allergen Reactions  . Other Other (See Comments)    Neosporin Ophthalmic causes eyes to turn red    Physical Exam General: well developed, well nourished, seated, in no evident distress Head: head normocephalic and atraumatic.   Neck: supple with no carotid or supraclavicular bruits Cardiovascular: regular rate and rhythm, no murmurs Musculoskeletal: no deformity. Wearing back brace for compression fracture Skin:  no rash/petichiae Vascular:  Normal pulses all extremities  Neurologic Exam Mental Status: Awake and fully alert. Oriented to place and time. Recent and remote memory intact. Attention span, concentration and fund of knowledge appropriate. Mood and affect appropriate.  Cranial Nerves: Fundoscopic exam reveals sharp disc margins. Pupils equal, briskly reactive to light. Extraocular movements full without nystagmus. Visual fields full to confrontation. Hearing intact. Facial sensation intact. Face, tongue, palate moves normally and symmetrically.  Motor: Normal bulk and tone. Normal strength in all tested extremity muscles. Sensory.: intact to touch , pinprick , position and vibratory sensation.  Coordination: Rapid alternating movements normal in all extremities. Finger-to-nose and heel-to-shin performed accurately bilaterally. Gait and Station: Arises from chair without difficulty. Stance is normal. Gait demonstrates normal stride length and balance . Able to heel, toe and tandem walk without difficulty.  Reflexes: 1+ and symmetric. Toes downgoing.       ASSESSMENT: 25 year Caucasian male with 2 episodes of witnessed generalized tonic-clonic seizure on 09/16/15 likely due to complex partial seizures with secondary generalization given prior history of brief episodes of staring and unresponsiveness. Chronic compression fracture of the spine following fall related to seizure    PLAN: I had a long discussion with the patient and his son regarding his recent  episodes of 2 generalized seizures, discussed risk for seizure recurrence and answered questions. I recommend he stay on Keppra 500 mg twice daily. If he has breakthrough complex partial seizures may need to consider increasing the dose. I have discussed possible side effects and advised him to call me if needed. We will follow EEG results from study done today. Patient was counseled not to drive for the next 3-6 months as per [facility] and to avoid seizure provoking stimuli like sleep deprivation, extremes of physical activity and changes in diet. Greater than 50% time during this 45 minute consultation visit was spent on counseling and coordination of care about his seizures recurrence risk and treatment options He was advised to follow-up with the pain management doctor for his pain and return for follow-up in 3 months Antony Contras, MD  [facility] [address] [place], [place] [address]  Phone [phone] Fax [phone] Note: This document was prepared with digital dictation and possible smart phrase technology. Any transcriptional errors that result from this process are unintentional.

## 2015-11-27 ENCOUNTER — Other Ambulatory Visit: Payer: Self-pay | Admitting: Student

## 2015-11-27 DIAGNOSIS — S32010D Wedge compression fracture of first lumbar vertebra, subsequent encounter for fracture with routine healing: Secondary | ICD-10-CM

## 2015-11-27 DIAGNOSIS — S22080D Wedge compression fracture of T11-T12 vertebra, subsequent encounter for fracture with routine healing: Secondary | ICD-10-CM

## 2015-12-08 ENCOUNTER — Ambulatory Visit
Admission: RE | Admit: 2015-12-08 | Discharge: 2015-12-08 | Disposition: A | Discharge status: Home / Self Care | Payer: Medicare Other | Source: Ambulatory Visit | Attending: Student | Admitting: Student

## 2015-12-08 ENCOUNTER — Ambulatory Visit: Admission: RE | Admit: 2015-12-08 | Payer: Medicare Other | Source: Ambulatory Visit

## 2015-12-08 DIAGNOSIS — M47896 Other spondylosis, lumbar region: Secondary | ICD-10-CM | POA: Insufficient documentation

## 2015-12-08 DIAGNOSIS — M4854XD Collapsed vertebra, not elsewhere classified, thoracic region, subsequent encounter for fracture with routine healing: Secondary | ICD-10-CM | POA: Diagnosis not present

## 2015-12-08 DIAGNOSIS — S22080D Wedge compression fracture of T11-T12 vertebra, subsequent encounter for fracture with routine healing: Secondary | ICD-10-CM

## 2015-12-08 DIAGNOSIS — M5126 Other intervertebral disc displacement, lumbar region: Secondary | ICD-10-CM | POA: Insufficient documentation

## 2015-12-08 DIAGNOSIS — S32010D Wedge compression fracture of first lumbar vertebra, subsequent encounter for fracture with routine healing: Secondary | ICD-10-CM

## 2015-12-08 DIAGNOSIS — M4856XD Collapsed vertebra, not elsewhere classified, lumbar region, subsequent encounter for fracture with routine healing: Secondary | ICD-10-CM | POA: Diagnosis not present

## 2016-10-22 NOTE — Progress Notes (Signed)
HPI:   [name] is a 71 y.o. male, who is here today to establish care.  Former PCP: Samuel Samuel Mayo. Last preventive routine visit: 11/2015.  Chronic medical problems: Osteoporosis, HTN, HLD, "spells", and seizure disorder among some. He follows with ortho, closed compression vertebral fracture T11 and L1 (fall 09/16/15).  Tonic-clonic seizure first episode in 09/2015. He is currently on Keppra, recently increased because he was having some spells when he was "not there 100% ."These episodes resolved after Keppra was adjusted, 08/2016.    He is not driving. He lives with his girlfriend. He exercises regularly but has not been consistent with a healthy diet.  He follows with neuro, Samuel Samuel Mayo.  Hypertension:   Dx 2-3 years ago.  Currently on Amlodipine 5 mg daily.  S/P repair mitral valve, reporting "gentic" valve abnormality. One of his son also had same problem and corrected surgically.   He is taking medications daily, no side effects reported.  He has not noted unusual headache, visual changes, exertional chest pain, dyspnea,  focal weakness, or edema.  Osteoporosis:  Hx of closed compression fractures T11 and L1 (12/28/15) He takes Boniva 150 mg monthly. DEXA 11/2015, left hip. Tolerating medication well. No recent falls.  He also follows with dermatologists annually, Samuel Samuel Mayo. Hx of lentigines,AK, and inflamed SK.  Review of Systems  Constitutional: Negative for activity change, appetite change, fatigue, fever and unexpected weight change.  HENT: Negative for nosebleeds, sore throat and trouble swallowing.   Eyes: Negative for redness and visual disturbance.  Respiratory: Negative for cough, shortness of breath and wheezing.   Cardiovascular: Negative for chest pain, palpitations and leg swelling.  Gastrointestinal: Negative for abdominal pain, nausea and vomiting.  Genitourinary: Negative for decreased urine volume, dysuria and hematuria.    Musculoskeletal: Negative for gait problem and myalgias.  Skin: Negative for pallor and rash.  Neurological: Negative for seizures, syncope, weakness and headaches.  Psychiatric/Behavioral: Negative for confusion. The patient is not nervous/anxious.       Current Outpatient Prescriptions on File Prior to Visit  Medication Sig Dispense Refill  . aspirin EC 81 MG tablet Take 81 mg by mouth daily.    . Multiple Vitamin (MULTIVITAMIN WITH MINERALS) TABS tablet Take 1 tablet by mouth See admin instructions. Take 1 tablet by mouth every 2-3 days     No current facility-administered medications on file prior to visit.      Past Medical History:  Diagnosis Date  . AK (actinic keratosis)   . Allergy   . Chicken pox   . Compression fracture of L1 lumbar vertebra (HCC)   . Compression fracture of thoracic vertebra (HCC)   . Hypertension   . Lentigines    Follows with Samuel Mayo (derma)  . Seizures (HCC)    Allergies  Allergen Reactions  . Other Other (See Comments)    Neosporin Ophthalmic causes eyes to turn red    Family History  Problem Relation Age of Onset  . Heart disease Mother   . Cancer Father   . Heart disease Father   . Aneurysm Paternal Grandfather   . Heart disease Son        congenital mitral valve dz    Social History   Social History  . Marital status: Widowed    Spouse name: N/A  . Number of children: N/A  . Years of education: N/A   Social History Main Topics  . Smoking status: Never Smoker  . Smokeless tobacco: Never  Used  . Alcohol use 0.6 oz/week    1 Cans of beer per week     Comment: socially  . Drug use: No  . Sexual activity: Not Asked   Other Topics Concern  . None   Social History Narrative  . None    Vitals:   10/23/16 0950  BP: 122/80  Pulse: 95  Resp: 12  SpO2: 97%    Body mass index is 27.18 kg/m.   Physical Exam  Nursing note and vitals reviewed. Constitutional: He is oriented to person, place, and time. He  appears well-developed. No distress.  HENT:  Head: Normocephalic and atraumatic.  Mouth/Throat: Oropharynx is clear and moist and mucous membranes are normal.  Eyes: Pupils are equal, round, and reactive to light. Conjunctivae and EOM are normal.  Neck: No tracheal deviation present. No thyroid mass and no thyromegaly present.  Cardiovascular: Normal rate and regular rhythm.   No murmur heard. Pulses:      Dorsalis pedis pulses are 2+ on the right side, and 2+ on the left side.  Respiratory: Effort normal and breath sounds normal. No respiratory distress.  GI: Soft. He exhibits no mass. There is no hepatomegaly. There is no tenderness.  Musculoskeletal: He exhibits no edema.  Lymphadenopathy:    He has no cervical adenopathy.  Neurological: He is alert and oriented to person, place, and time. He has normal strength. Gait normal.  Skin: Skin is warm. No erythema.  Psychiatric: He has a normal mood and affect.  Well groomed, good eye contact.     ASSESSMENT AND PLAN:   [name] was seen today for establish care.  Diagnoses and all orders for this visit:  Localized osteoporosis without current pathological fracture  No changes in current management. Fall precautions. Continue Ca++ and Vit D supplementation. DEXA in 2019.  -     ibandronate (BONIVA) 150 MG tablet; Take 1 tablet (150 mg total) by mouth every 30 (thirty) days. Take in the morning with a full glass of water, on an empty stomach, and do not take anything else by mouth or lie down for the next 30 min.  Benign essential hypertension  Adequately controlled. No changes in current management. DASH diet recommended. Eye exam recommended annually. F/U in 6 months, before if needed.  -     amLODipine (NORVASC) 5 MG tablet; Take 1 tablet (5 mg total) by mouth daily.   Partial symptomatic epilepsy with complex partial seizures, not intractable, without status epilepticus ([place])  Well controlled with current dose  of Keppra. He Samuel continue following with Samuel Samuel Mayo. He should not drive until 6 months from last spell episode.    He is due for his CPE in 11/2016, so we Samuel hold on labs until next OV. He agrees with CPE in 01/2017.     Samuel Halladay G. [place], MD  [facility]. [place] office.

## 2016-10-23 ENCOUNTER — Ambulatory Visit (INDEPENDENT_AMBULATORY_CARE_PROVIDER_SITE_OTHER): Payer: Medicare Other | Admitting: Family Medicine

## 2016-10-23 ENCOUNTER — Encounter: Payer: Self-pay | Admitting: Family Medicine

## 2016-10-23 VITALS — BP 122/80 | HR 95 | Resp 12 | Ht 73.0 in | Wt 206.0 lb

## 2016-10-23 DIAGNOSIS — G40209 Localization-related (focal) (partial) symptomatic epilepsy and epileptic syndromes with complex partial seizures, not intractable, without status epilepticus: Secondary | ICD-10-CM | POA: Diagnosis not present

## 2016-10-23 DIAGNOSIS — M816 Localized osteoporosis [Lequesne]: Secondary | ICD-10-CM

## 2016-10-23 DIAGNOSIS — I10 Essential (primary) hypertension: Secondary | ICD-10-CM

## 2016-10-23 DIAGNOSIS — G40909 Epilepsy, unspecified, not intractable, without status epilepticus: Secondary | ICD-10-CM | POA: Diagnosis not present

## 2016-10-23 DIAGNOSIS — M810 Age-related osteoporosis without current pathological fracture: Secondary | ICD-10-CM | POA: Insufficient documentation

## 2016-10-23 MED ORDER — AMLODIPINE BESYLATE 5 MG PO TABS: 5.0000 mg | ORAL_TABLET | Freq: Every day | ORAL | 2 refills | Status: DC

## 2016-10-23 MED ORDER — IBANDRONATE SODIUM 150 MG PO TABS: 150.0000 mg | ORAL_TABLET | ORAL | 3 refills | Status: DC

## 2016-10-23 NOTE — Patient Instructions (Signed)
A few things to remember from today's visit:   Localized osteoporosis without current pathological fracture - Plan: ibandronate (BONIVA) 150 MG tablet  Benign essential hypertension - Plan: amLODipine (NORVASC) 5 MG tablet  Blood pressure goal for most people is less than 140/90.   Most recent cardiologists' recommendations recommend blood pressure at or less than 130/80.   Elevated blood pressure increases the risk of strokes, heart and kidney disease, and eye problems. Regular physical activity and a healthy diet (DASH diet) usually help. Low salt diet. Take medications as instructed.  Caution with some over the counter medications as cold medications, dietary products (for weight loss), and Ibuprofen or Aleve (frequent use);all these medications could cause elevation of blood pressure.   DASH Eating Plan DASH stands for "Dietary Approaches to Stop Hypertension." The DASH eating plan is a healthy eating plan that has been shown to reduce high blood pressure (hypertension). It may also reduce your risk for type 2 diabetes, heart disease, and stroke. The DASH eating plan may also help with weight loss. What are tips for following this plan? General guidelines  Avoid eating more than 2,300 mg (milligrams) of salt (sodium) a day. If you have hypertension, you may need to reduce your sodium intake to 1,500 mg a day.  Limit alcohol intake to no more than 1 drink a day for nonpregnant women and 2 drinks a day for men. One drink equals 12 oz of beer, 5 oz of wine, or 1 oz of hard liquor.  Work with your health care provider to maintain a healthy body weight or to lose weight. Ask what an ideal weight is for you.  Get at least 30 minutes of exercise that causes your heart to beat faster (aerobic exercise) most days of the week. Activities may include walking, swimming, or biking.  Work with your health care provider or diet and nutrition specialist (dietitian) to adjust your eating plan  to your individual calorie needs. Reading food labels  Check food labels for the amount of sodium per serving. Choose foods with less than 5 percent of the Daily Value of sodium. Generally, foods with less than 300 mg of sodium per serving fit into this eating plan.  To find whole grains, look for the word "whole" as the first word in the ingredient list. Shopping  Buy products labeled as "low-sodium" or "no salt added."  Buy fresh foods. Avoid canned foods and premade or frozen meals. Cooking  Avoid adding salt when cooking. Use salt-free seasonings or herbs instead of table salt or sea salt. Check with your health care provider or pharmacist before using salt substitutes.  Do not fry foods. Cook foods using healthy methods such as baking, boiling, grilling, and broiling instead.  Cook with heart-healthy oils, such as olive, canola, soybean, or sunflower oil. Meal planning   Eat a balanced diet that includes: ? 5 or more servings of fruits and vegetables each day. At each meal, try to fill half of your plate with fruits and vegetables. ? Up to 6-8 servings of whole grains each day. ? Less than 6 oz of lean meat, poultry, or fish each day. A 3-oz serving of meat is about the same size as a deck of cards. One egg equals 1 oz. ? 2 servings of low-fat dairy each day. ? A serving of nuts, seeds, or beans 5 times each week. ? Heart-healthy fats. Healthy fats called Omega-3 fatty acids are found in foods such as flaxseeds and coldwater fish, like  sardines, salmon, and mackerel.  Limit how much you eat of the following: ? Canned or prepackaged foods. ? Food that is high in trans fat, such as fried foods. ? Food that is high in saturated fat, such as fatty meat. ? Sweets, desserts, sugary drinks, and other foods with added sugar. ? Full-fat dairy products.  Do not salt foods before eating.  Try to eat at least 2 vegetarian meals each week.  Eat more home-cooked food and less  restaurant, buffet, and fast food.  When eating at a restaurant, ask that your food be prepared with less salt or no salt, if possible. What foods are recommended? The items listed may not be a complete list. Talk with your dietitian about what dietary choices are best for you. Grains Whole-grain or whole-wheat bread. Whole-grain or whole-wheat pasta. Brown rice. [place]ena Morrow. Bulgur. Whole-grain and low-sodium cereals. Pita bread. Low-fat, low-sodium crackers. Whole-wheat flour tortillas. Vegetables Fresh or frozen vegetables (raw, steamed, roasted, or grilled). Low-sodium or reduced-sodium tomato and vegetable juice. Low-sodium or reduced-sodium tomato sauce and tomato paste. Low-sodium or reduced-sodium canned vegetables. Fruits All fresh, dried, or frozen fruit. Canned fruit in natural juice (without added sugar). Meat and other protein foods Skinless chicken or [place]. Ground chicken or [place]. Pork with fat trimmed off. Fish and seafood. Egg whites. Dried beans, peas, or lentils. Unsalted nuts, nut butters, and seeds. Unsalted canned beans. Lean cuts of beef with fat trimmed off. Low-sodium, lean deli meat. Dairy Low-fat (1%) or fat-free (skim) milk. Fat-free, low-fat, or reduced-fat cheeses. Nonfat, low-sodium ricotta or cottage cheese. Low-fat or nonfat yogurt. Low-fat, low-sodium cheese. Fats and oils Soft margarine without trans fats. Vegetable oil. Low-fat, reduced-fat, or light mayonnaise and salad dressings (reduced-sodium). Canola, safflower, olive, soybean, and sunflower oils. Avocado. Seasoning and other foods Herbs. Spices. Seasoning mixes without salt. Unsalted popcorn and pretzels. Fat-free sweets. What foods are not recommended? The items listed may not be a complete list. Talk with your dietitian about what dietary choices are best for you. Grains Baked goods made with fat, such as croissants, muffins, or some breads. Dry pasta or rice meal packs. Vegetables Creamed or  fried vegetables. Vegetables in a cheese sauce. Regular canned vegetables (not low-sodium or reduced-sodium). Regular canned tomato sauce and paste (not low-sodium or reduced-sodium). Regular tomato and vegetable juice (not low-sodium or reduced-sodium). Angie Fava. Olives. Fruits Canned fruit in a light or heavy syrup. Fried fruit. Fruit in cream or butter sauce. Meat and other protein foods Fatty cuts of meat. Ribs. Fried meat. Berniece Salines. Sausage. Bologna and other processed lunch meats. Salami. Fatback. Hotdogs. Bratwurst. Salted nuts and seeds. Canned beans with added salt. Canned or smoked fish. Whole eggs or egg yolks. Chicken or [place] with skin. Dairy Whole or 2% milk, cream, and half-and-half. Whole or full-fat cream cheese. Whole-fat or sweetened yogurt. Full-fat cheese. Nondairy creamers. Whipped toppings. Processed cheese and cheese spreads. Fats and oils Butter. Stick margarine. Lard. Shortening. Ghee. Bacon fat. Tropical oils, such as coconut, palm kernel, or palm oil. Seasoning and other foods Salted popcorn and pretzels. Onion salt, garlic salt, seasoned salt, table salt, and sea salt. Worcestershire sauce. Tartar sauce. Barbecue sauce. Teriyaki sauce. Soy sauce, including reduced-sodium. Steak sauce. Canned and packaged gravies. Fish sauce. Oyster sauce. Cocktail sauce. Horseradish that you find on the shelf. Ketchup. Mustard. Meat flavorings and tenderizers. Bouillon cubes. Hot sauce and Tabasco sauce. Premade or packaged marinades. Premade or packaged taco seasonings. Relishes. Regular salad dressings. Where to find more information:  National Heart, Lung, and Blood Institute: https://wilson-eaton.com/  American Heart Association: www.heart.org Summary  The DASH eating plan is a healthy eating plan that has been shown to reduce high blood pressure (hypertension). It may also reduce your risk for type 2 diabetes, heart disease, and stroke.  With the DASH eating plan, you should limit salt  (sodium) intake to 2,300 mg a day. If you have hypertension, you may need to reduce your sodium intake to 1,500 mg a day.  When on the DASH eating plan, aim to eat more fresh fruits and vegetables, whole grains, lean proteins, low-fat dairy, and heart-healthy fats.  Work with your health care provider or diet and nutrition specialist (dietitian) to adjust your eating plan to your individual calorie needs. This information is not intended to replace advice given to you by your health care provider. Make sure you discuss any questions you have with your health care provider. Document Released: 02/07/2011 Document Revised: 02/12/2016 Document Reviewed: 02/12/2016 Elsevier Interactive Patient Education  2017 [employer].  Please be sure medication list is accurate. If a new problem present, please set up appointment sooner than planned today.

## 2016-10-26 ENCOUNTER — Encounter: Payer: Self-pay | Admitting: Family Medicine

## 2016-11-12 ENCOUNTER — Encounter: Payer: Medicare Other | Admitting: Family Medicine

## 2017-01-30 DIAGNOSIS — E559 Vitamin D deficiency, unspecified: Secondary | ICD-10-CM | POA: Insufficient documentation

## 2017-01-30 NOTE — Progress Notes (Signed)
HPI:  Samuel Mayo is a 71 y.o.male here today for his routine physical examination. He is also due for Medicare preventive visit, he doe snot recall having one before.  Last CPE: 11/08/15  Regular exercise 3 or more times per week: Not consistently. Following a healthy diet: Yes.   Chronic medical problems: HTN,osteoporosis,seizure disorder  He follows with neurologist annually. DEXA 11/2015 osteoporosis, he is on Boniva. He takes Vit D, not on Ca++ supplementation.  HTN: On Amlodipine 5 mg daily.   He lives with girlfriend. Independent ADL's and IADL's. No falls in the past year and denies depression symptoms.  Functional Status Survey: Is the patient deaf or have difficulty hearing?: No Does the patient have difficulty seeing, even when wearing glasses/contacts?: No Does the patient have difficulty concentrating, remembering, or making decisions?: No Does the patient have difficulty walking or climbing stairs?: No Does the patient have difficulty dressing or bathing?: No Does the patient have difficulty doing errands alone such as visiting a doctor's office or shopping?: No  Fall Risk  01/31/2017 09/21/2015  Falls in the past year? Yes Yes  Number falls in past yr: - 1  Injury with Fall? - Yes  Comment - compression fracture     Providers he sees regularly:  Eye care provider: Dr Matilde Sprang. Dr Melrose Nakayama (neuro) Dr Will Bonnet (derma)  Depression screen [facility] 2/9 01/31/2017  Decreased Interest 0  Down, Depressed, Hopeless 0  PHQ - 2 Score 0    Mini-Cog - 01/31/17 0820    Normal clock drawing test?  yes    How many words correct?  3         Hearing Screening   125Hz  250Hz  500Hz  1000Hz  2000Hz  3000Hz  4000Hz  6000Hz  8000Hz   Right ear:   Pass Pass Pass  Pass    Left ear:   Pass Pass Pass  Pass      Visual Acuity Screening   Right eye Left eye Both eyes  Without correction:     With correction: 20/13 20/13 20/13      Immunization History  Administered  Date(s) Administered  . Influenza-Unspecified 12/18/2016  . Pneumococcal Conjugate-13 01/31/2017      -Hep C screening: 11/10/2014 negative. Lipid panel 11/2015  Cholesterol, Total 147 100 - 200 mg/dL [facility]  Triglyceride 53 35 - 199 mg/dL [facility]  HDL (High Density Lipoprotein) Cholesterol 70.1 29.0 - 71.0 mg/dL [place] [facility]  LDL (Low Density Lipoprotien), Calculated 66       Last colon cancer screening: Per pt report, he had colonoscopy 3-4 years ago and it was "fine." Last prostate ca screening: PSA 11/2014 was 1.84. Denies dysuria,increased urinary frequency, gross hematuria,or decreased urine output. Nocturia if he drinks fluids before bedtime, 2-3 times.  No urine dribbling or incontinence.  Planning on updating his POA and living will.  -Denies high alcohol intake, hx of tobacco use, or Hx of illicit drug use.     Review of Systems  Constitutional: Negative for activity change, appetite change, fatigue and fever.  HENT: Negative for dental problem, nosebleeds, sore throat, trouble swallowing and voice change.   Eyes: Negative for redness and visual disturbance.  Respiratory: Negative for cough, shortness of breath and wheezing.   Cardiovascular: Negative for chest pain, palpitations and leg swelling.  Gastrointestinal: Negative for abdominal pain, blood in stool, nausea and vomiting.  Endocrine: Negative for cold intolerance, heat intolerance, polydipsia, polyphagia and polyuria.  Genitourinary: Negative for  decreased urine volume, dysuria, genital sores, hematuria and testicular pain.  Musculoskeletal: Negative for arthralgias, back pain, joint swelling and myalgias.  Skin: Negative for color change and rash.  Neurological: Negative for dizziness, seizures, syncope, weakness and headaches.  Hematological: Negative for adenopathy. Does not bruise/bleed easily.  Psychiatric/Behavioral: Negative for confusion and sleep  disturbance. The patient is not nervous/anxious.      Current Outpatient Medications on File Prior to Visit  Medication Sig Dispense Refill  . amLODipine (NORVASC) 5 MG tablet Take 1 tablet (5 mg total) by mouth daily. 90 tablet 2  . aspirin EC 81 MG tablet Take 81 mg by mouth daily.    . Cholecalciferol (VITAMIN D3) 10000 units TABS Take 1 tablet by mouth daily.    Marland Kitchen ibandronate (BONIVA) 150 MG tablet Take 1 tablet (150 mg total) by mouth every 30 (thirty) days. Take in the morning with a full glass of water, on an empty stomach, and do not take anything else by mouth or lie down for the next 30 min. 4 tablet 3  . levETIRAcetam (KEPPRA) 750 MG tablet Take 750 mg by mouth 2 (two) times daily.    . Multiple Vitamin (MULTIVITAMIN WITH MINERALS) TABS tablet Take 1 tablet by mouth See admin instructions. Take 1 tablet by mouth every 2-3 days     No current facility-administered medications on file prior to visit.      Past Medical History:  Diagnosis Date  . AK (actinic keratosis)   . Allergy   . Chicken pox   . Compression fracture of L1 lumbar vertebra (HCC)   . Compression fracture of thoracic vertebra (HCC)   . Hypertension   . Lentigines    Follows with DR Culton (derma)  . Seizures ([place])     Past Surgical History:  Procedure Laterality Date  . mitral vavle     Congetinal mitral valve disease.    Allergies  Allergen Reactions  . Other Other (See Comments)    Neosporin Ophthalmic causes eyes to turn red    Family History  Problem Relation Age of Onset  . Heart disease Mother   . Cancer Father   . Heart disease Father   . Aneurysm Paternal Grandfather   . Heart disease Son        congenital mitral valve dz    Social History   Socioeconomic History  . Marital status: Widowed    Spouse name: None  . Number of children: None  . Years of education: None  . Highest education level: None  Social Needs  . Financial resource strain: None  . Food insecurity -  worry: None  . Food insecurity - inability: None  . Transportation needs - medical: None  . Transportation needs - non-medical: None  Occupational History  . None  Tobacco Use  . Smoking status: Never Smoker  . Smokeless tobacco: Never Used  Substance and Sexual Activity  . Alcohol use: Yes    Alcohol/week: 0.6 oz    Types: 1 Cans of beer per week    Comment: socially  . Drug use: No  . Sexual activity: None  Other Topics Concern  . None  Social History Narrative  . None     Vitals:   01/31/17 0751  BP: 126/77  Pulse: 79  Resp: 12  Temp: 97.6 F (36.4 C)  SpO2: 98%   Body mass index is 26.68 kg/m.   Wt Readings from Last 3 Encounters:  01/31/17 202 lb 4 oz (91.7  kg)  10/23/16 206 lb (93.4 kg)  09/21/15 205 lb 9.6 oz (93.3 kg)    Physical Exam  Nursing note and vitals reviewed. Constitutional: He is oriented to person, place, and time. He appears well-developed and well-nourished. No distress.  HENT:  Head: Normocephalic and atraumatic.  Right Ear: Hearing, tympanic membrane, external ear and ear canal normal.  Left Ear: Hearing, tympanic membrane, external ear and ear canal normal.  Mouth/Throat: Oropharynx is clear and moist and mucous membranes are normal.  Eyes: Conjunctivae and EOM are normal. Pupils are equal, round, and reactive to light.  Neck: Normal range of motion. No tracheal deviation present. No thyromegaly present.  Cardiovascular: Normal rate and regular rhythm.  No murmur heard. Pulses:      Dorsalis pedis pulses are 2+ on the right side, and 2+ on the left side.  Respiratory: Effort normal and breath sounds normal. No respiratory distress.  GI: Soft. He exhibits no mass. There is no tenderness.  Genitourinary:  Genitourinary Comments: Refused,no concerns.  Musculoskeletal: He exhibits no edema or tenderness.  No major deformities appreciated and no signs of synovitis.  Lymphadenopathy:    He has no cervical adenopathy.       Right: No  supraclavicular adenopathy present.       Left: No supraclavicular adenopathy present.  Neurological: He is alert and oriented to person, place, and time. He has normal strength. No cranial nerve deficit or sensory deficit. Coordination and gait normal.  Reflex Scores:      Bicep reflexes are 2+ on the right side and 2+ on the left side.      Patellar reflexes are 2+ on the right side and 2+ on the left side. Skin: Skin is warm. No rash noted. No erythema.  Psychiatric: He has a normal mood and affect.     ASSESSMENT AND PLAN:   Samuel Mayo was seen today for annual exam.  Diagnoses and all orders for this visit:  Lab Results  Component Value Date   ALT 18 01/31/2017   AST 21 01/31/2017   ALKPHOS 45 01/31/2017   BILITOT 0.9 01/31/2017   Lab Results  Component Value Date   CREATININE 1.03 01/31/2017   BUN 19 01/31/2017   NA 135 01/31/2017   K 4.3 01/31/2017   CL 100 01/31/2017   CO2 26 01/31/2017    Routine general medical examination at a health care facility  Preventive guidelines reviewed. Vaccination updated. Aspirin to continue, side effects discussed. Ca++ and vit D supplementation recommended. Next CPE in a year.  He will continue following with neurologist.  Initial Medicare annual wellness visit  We discussed the importance of staying active, physically and mentally, as well as the benefits of a healthy/balnace diet. Low impact exercise that involve stretching and strengthing are ideal.  We discussed preventive screening for the next 5-10 years, summery of recommendations given in AVS: Recto sigmoidoscopy in 2006, colonoscopy in 2011(due in 2021). Pneumovax 07/2010. Tdap 10/2013. Zoster in 07/2009.  Eye exam and glaucoma screening annually. Diabetes and lipid screening. Fall prevention.  Advance directives and end of life discussed, planning on updating POA and living will.    Vitamin D deficiency, unspecified  No changes in current management,  will follow labs done today and will give further recommendations accordingly.  -     VITAMIN D 25 Hydroxy (Vit-D Deficiency, Fractures) -     Comprehensive metabolic panel  BPH associated with nocturia  Mild symptoms. Limit fluid intake within 3 hours before  bedtime.  -     Urinalysis, Routine w reflex microscopic -     PSA(Must document that pt has been informed of limitations of PSA testing.)  Benign essential hypertension  Adequately controlled. No changes in current management. DASH-low diet recommended. Eye exam recommended annually. Monitor BP at home. F/U in 12 months, before if needed.  Need for pneumococcal vaccination -     Pneumococcal conjugate vaccine 13-valent IM      Return in 1 year (on 01/31/2018) for cpe.    [name]. Martinique, MD  [facility]. [place] office.

## 2017-01-31 ENCOUNTER — Encounter: Payer: Self-pay | Admitting: Family Medicine

## 2017-01-31 ENCOUNTER — Ambulatory Visit (INDEPENDENT_AMBULATORY_CARE_PROVIDER_SITE_OTHER): Payer: Medicare Other | Admitting: Family Medicine

## 2017-01-31 VITALS — BP 126/77 | HR 79 | Temp 97.6°F | Resp 12 | Ht 73.0 in | Wt 202.2 lb

## 2017-01-31 DIAGNOSIS — I10 Essential (primary) hypertension: Secondary | ICD-10-CM

## 2017-01-31 DIAGNOSIS — N401 Enlarged prostate with lower urinary tract symptoms: Secondary | ICD-10-CM | POA: Diagnosis not present

## 2017-01-31 DIAGNOSIS — Z23 Encounter for immunization: Secondary | ICD-10-CM | POA: Diagnosis not present

## 2017-01-31 DIAGNOSIS — R351 Nocturia: Secondary | ICD-10-CM

## 2017-01-31 DIAGNOSIS — Z0000 Encounter for general adult medical examination without abnormal findings: Secondary | ICD-10-CM | POA: Diagnosis not present

## 2017-01-31 DIAGNOSIS — E559 Vitamin D deficiency, unspecified: Secondary | ICD-10-CM

## 2017-01-31 LAB — PSA: PSA: 1.94 ng/mL (ref 0.10–4.00)

## 2017-01-31 LAB — URINALYSIS, ROUTINE W REFLEX MICROSCOPIC
BILIRUBIN URINE: NEGATIVE
HGB URINE DIPSTICK: NEGATIVE
LEUKOCYTES UA: NEGATIVE
NITRITE: NEGATIVE
RBC / HPF: NONE SEEN (ref 0–?)
Specific Gravity, Urine: 1.015 (ref 1.000–1.030)
TOTAL PROTEIN, URINE-UPE24: NEGATIVE
UROBILINOGEN UA: 0.2 (ref 0.0–1.0)
Urine Glucose: NEGATIVE
WBC UA: NONE SEEN (ref 0–?)
pH: 7 (ref 5.0–8.0)

## 2017-01-31 LAB — COMPREHENSIVE METABOLIC PANEL
ALT: 18 U/L (ref 0–53)
AST: 21 U/L (ref 0–37)
Albumin: 4.3 g/dL (ref 3.5–5.2)
Alkaline Phosphatase: 45 U/L (ref 39–117)
BILIRUBIN TOTAL: 0.9 mg/dL (ref 0.2–1.2)
BUN: 19 mg/dL (ref 6–23)
CO2: 26 meq/L (ref 19–32)
Calcium: 9 mg/dL (ref 8.4–10.5)
Chloride: 100 mEq/L (ref 96–112)
Creatinine, Ser: 1.03 mg/dL (ref 0.40–1.50)
GFR: 75.25 mL/min (ref >60.00)
GLUCOSE: 98 mg/dL (ref 70–99)
Potassium: 4.3 mEq/L (ref 3.5–5.1)
SODIUM: 135 meq/L (ref 135–145)
Total Protein: 6.7 g/dL (ref 6.0–8.3)

## 2017-01-31 LAB — VITAMIN D 25 HYDROXY (VIT D DEFICIENCY, FRACTURES): VITD: 42.81 ng/mL (ref 30.00–100.00)

## 2017-01-31 NOTE — Patient Instructions (Addendum)
A few things to remember from today's visit:   BPH associated with nocturia - Plan: Urinalysis, Routine w reflex microscopic, PSA(Must document that pt has been informed of limitations of PSA testing.)  Vitamin D deficiency, unspecified - Plan: VITAMIN D 25 Hydroxy (Vit-D Deficiency, Fractures), Comprehensive metabolic panel  Initial Medicare annual wellness visit  Routine general medical examination at a health care facility   A few tips:  -As we age balance is not as good as it was, so there is a higher risks for falls. Please remove small rugs and furniture that is "in your way" and could increase the risk of falls. Stretching exercises may help with fall prevention: Yoga and Tai Chi are some examples. Low impact exercise is better, so you are not very achy the next day.  -Sun screen and avoidance of direct sun light recommended. Caution with dehydration, if working outdoors be sure to drink enough fluids.  - Some medications are not safe as we age, increases the risk of side effects and can potentially interact with other medication you are also taken;  including some of over the counter medications. Be sure to let me know when you start a new medication even if it is a dietary/vitamin supplement.   -Healthy diet low in red meet/animal fat and sugar + regular physical activity is recommended.       Screening schedule for the next 5-10 years:  Colonoscopy: We need copy of last one. Theoretically you are due in 6-7 years.  Glaucoma screening/eye exam every 1-2 years.  Flu vaccine annually.  Diabetes and cholesterol screening   Fall prevention  Calcium and vit D.  Prostate cancer screening as we discussed.  Abdominal aortic aneurysm screening if you ever smoke.    Advance directives:  Please see a lawyer and/or go to this website to help you with advanced directives and designating a health care power of attorney so that your wishes will be followed should you become  too ill to make your own medical decisions.  [email or web address]  Please be sure medication list is accurate. If a new problem present, please set up appointment sooner than planned today.

## 2017-06-10 ENCOUNTER — Ambulatory Visit (INDEPENDENT_AMBULATORY_CARE_PROVIDER_SITE_OTHER): Payer: Medicare Other | Admitting: Family Medicine

## 2017-06-10 ENCOUNTER — Encounter: Payer: Self-pay | Admitting: Family Medicine

## 2017-06-10 VITALS — BP 130/83 | HR 100 | Temp 98.1°F | Resp 12 | Ht 73.0 in | Wt 201.2 lb

## 2017-06-10 DIAGNOSIS — M7541 Impingement syndrome of right shoulder: Secondary | ICD-10-CM | POA: Diagnosis not present

## 2017-06-10 DIAGNOSIS — M25511 Pain in right shoulder: Secondary | ICD-10-CM | POA: Diagnosis not present

## 2017-06-10 MED ORDER — HYDROCODONE-ACETAMINOPHEN 5-325 MG PO TABS: 1.0000 | ORAL_TABLET | Freq: Every evening | ORAL | 0 refills | Status: DC | PRN

## 2017-06-10 MED ORDER — HYDROCODONE-ACETAMINOPHEN 5-325 MG PO TABS: 1.0000 | ORAL_TABLET | Freq: Every evening | ORAL | 0 refills | Status: AC | PRN

## 2017-06-10 NOTE — Progress Notes (Signed)
ACUTE VISIT   HPI:  Chief Complaint  Patient presents with  . Right shoulder pain    possible pulled muscle, started Sunday    SamuelSamuel Mayo is a 72 y.o. male, who is here today complaining of 2-3 days of gradual onset of right shoulder pain. It is exacerbated with certain movements, mainly when trying to reach across. Alleviated by rest. Mild limitation of ROM due to pain. No Hx of trauma/injury.  It is intermittent, moderate, worse when sleeping on right side, interfering with sleep. He cannot describe type of pain, "it hurts."  No prior Hx of shoulder pain and he has not tried OTC medications.  Getting worse.    Review of Systems  Constitutional: Positive for activity change. Negative for chills and fever.  Respiratory: Negative for cough, shortness of breath and wheezing.   Cardiovascular: Negative for chest pain.  Musculoskeletal: Positive for arthralgias. Negative for joint swelling and neck pain.  Skin: Negative for color change and rash.  Neurological: Negative for weakness and numbness.  Psychiatric/Behavioral: Positive for sleep disturbance. Negative for confusion.      Current Outpatient Medications on File Prior to Visit  Medication Sig Dispense Refill  . amLODipine (NORVASC) 5 MG tablet Take 1 tablet (5 mg total) by mouth daily. 90 tablet 2  . aspirin EC 81 MG tablet Take 81 mg by mouth daily.    . Cholecalciferol (VITAMIN D3) 10000 units TABS Take 1 tablet by mouth daily.    Marland Kitchen ibandronate (BONIVA) 150 MG tablet Take 1 tablet (150 mg total) by mouth every 30 (thirty) days. Take in the morning with a full glass of water, on an empty stomach, and do not take anything else by mouth or lie down for the next 30 min. 4 tablet 3  . levETIRAcetam (KEPPRA) 750 MG tablet Take 750 mg by mouth 2 (two) times daily.    . Multiple Vitamin (MULTIVITAMIN WITH MINERALS) TABS tablet Take 1 tablet by mouth See admin instructions. Take 1 tablet by mouth every 2-3  days     No current facility-administered medications on file prior to visit.      Past Medical History:  Diagnosis Date  . AK (actinic keratosis)   . Allergy   . Chicken pox   . Compression fracture of L1 lumbar vertebra (HCC)   . Compression fracture of thoracic vertebra (HCC)   . Hypertension   . Lentigines    Follows with DR Culton (derma)  . Seizures (HCC)    Allergies  Allergen Reactions  . Other Other (See Comments)    Neosporin Ophthalmic causes eyes to turn red    Social History   Socioeconomic History  . Marital status: Widowed    Spouse name: Not on file  . Number of children: Not on file  . Years of education: Not on file  . Highest education level: Not on file  Occupational History  . Not on file  Social Needs  . Financial resource strain: Not on file  . Food insecurity:    Worry: Not on file    Inability: Not on file  . Transportation needs:    Medical: Not on file    Non-medical: Not on file  Tobacco Use  . Smoking status: Never Smoker  . Smokeless tobacco: Never Used  Substance and Sexual Activity  . Alcohol use: Yes    Alcohol/week: 0.6 oz    Types: 1 Cans of beer per week    Comment: socially  .  Drug use: No  . Sexual activity: Not on file  Lifestyle  . Physical activity:    Days per week: Not on file    Minutes per session: Not on file  . Stress: Not on file  Relationships  . Social connections:    Talks on phone: Not on file    Gets together: Not on file    Attends religious service: Not on file    Active member of club or organization: Not on file    Attends meetings of clubs or organizations: Not on file    Relationship status: Not on file  Other Topics Concern  . Not on file  Social History Narrative  . Not on file    Vitals:   06/10/17 1107  BP: 130/83  Pulse: 100  Resp: 12  Temp: 98.1 F (36.7 C)  SpO2: 98%   Body mass index is 26.55 kg/m.   Physical Exam  Nursing note and vitals reviewed. Constitutional:  He is oriented to person, place, and time. He appears well-developed and well-nourished. No distress.  HENT:  Head: Normocephalic and atraumatic.  Eyes: Conjunctivae are normal.  Cardiovascular: Normal rate and regular rhythm.  Respiratory: Effort normal and breath sounds normal. No respiratory distress.  Musculoskeletal: He exhibits no edema.  Shoulder: No deformity, edema, or erythema appreciated.No muscle atrophy. [name] test pos, drop arm rotator cuff test neg, empty can supraspinatus test pos, cross body adduction test neg, lift-Off Subscapularis test pos. ROM mildly limited,it elicits pain.Marland Kitchen  Lymphadenopathy:    He has no cervical adenopathy.  Neurological: He is alert and oriented to person, place, and time. He has normal strength. Gait normal.  Skin: No rash noted. No erythema.  Psychiatric: He has a normal mood and affect.  Well groomed, good eye contact.     ASSESSMENT AND PLAN:   Samuel Mayo was seen today for right shoulder pain.  Diagnoses and all orders for this visit:  Acute pain of right shoulder.  Possible causes discussed. Became no hx of trauma I do not think imaging in needed at this time. Because Hx of HTN NSAID's are not recommended. Hx of seizure disorder,so Tramadol is not an option.  He has taken Hydrocodone before and has been well tolerated. Recommend taking it at bedtime mainly. Side effects discussed.   -     HYDROcodone-acetaminophen (NORCO/VICODIN) 5-325 MG tablet; Take 1 tablet by mouth at bedtime as needed for up to 7 days for moderate pain.  Impingement syndrome of right shoulder  Treatment options discussed, it seems mild ,so I do nit think steroid injection is needed. PT evaluation will be arranged. If not improvement ortho referral will be considered. F/U as needed.   -     Ambulatory referral to Physical Therapy  -     HYDROcodone-acetaminophen (NORCO/VICODIN) 5-325 MG tablet; Take 1 tablet by mouth at bedtime as needed for up  to 7 days for moderate pain.     -SamuelSamuel Mayo was advised to seek immediate medical attention if sudden worsening symptoms or to follow if they persist or if new concerns arise.       Ayanah Snader G. Martinique, MD  [facility]. [place] office.

## 2017-06-12 ENCOUNTER — Encounter: Payer: Self-pay | Admitting: Physical Therapy

## 2017-06-12 ENCOUNTER — Ambulatory Visit: Payer: Medicare Other | Attending: Family Medicine | Admitting: Physical Therapy

## 2017-06-12 DIAGNOSIS — R293 Abnormal posture: Secondary | ICD-10-CM | POA: Diagnosis present

## 2017-06-12 DIAGNOSIS — M25611 Stiffness of right shoulder, not elsewhere classified: Secondary | ICD-10-CM | POA: Insufficient documentation

## 2017-06-12 DIAGNOSIS — M25511 Pain in right shoulder: Secondary | ICD-10-CM | POA: Insufficient documentation

## 2017-06-12 NOTE — Patient Instructions (Signed)
Access Code: [email or web address]  URL: [email or web address]  Date: 06/12/2017  Prepared by: Faustino Congress   Exercises  Standing Shoulder Internal Rotation Stretch with Dowel - 3 reps - 1 sets - 30 sec hold - 2x daily - 7x weekly  Seated Scapular Retraction - 10 reps - 1 sets - 5 sec hold - 2x daily - 7x weekly  Standing Backward Shoulder Rolls - 10 reps - 1 sets - 2x daily - 7x weekly

## 2017-06-12 NOTE — Therapy (Signed)
[facility] [address] [place], [place], [address] Phone: [phone]   Fax:  [phone]  Physical Therapy Evaluation  Patient Details  Name: Samuel Mayo MRN: [id number] Date of Birth: June 09, 1945 Referring Provider: Martinique, [name], MD   Encounter Date: 06/12/2017  PT End of Session - 06/12/17 1324    Visit Number  1    Number of Visits  12    Date for PT Re-Evaluation  07/24/17    Authorization Type  [employer]HC Medicare $40 copay    PT Start Time  1230    PT Stop Time  1300    PT Time Calculation (min)  30 min    Activity Tolerance  Patient tolerated treatment well    Behavior During Therapy  [facility] for tasks assessed/performed       Past Medical History:  Diagnosis Date  . AK (actinic keratosis)   . Allergy   . Chicken pox   . Compression fracture of L1 lumbar vertebra (HCC)   . Compression fracture of thoracic vertebra (HCC)   . Hypertension   . Lentigines    Follows with DR Culton (derma)  . Seizures ([place])     Past Surgical History:  Procedure Laterality Date  . mitral vavle     Congetinal mitral valve disease.    There were no vitals filed for this visit.  Subjective Assessment - 06/12/17 1234    Subjective  Pt is a 72 y/o male who presents to OPPT with Rt shoulder pain which began 06/08/17.  Pt states he was doing some upper body weights and noticed some pain, thinking it was just a pulled muscle.  Pt went to MD when symptoms didn't improve and dx with Rt impingement syndrome.  Pt presents today with difficulty with cross body activities.      Diagnostic tests  none    Patient Stated Goals  be able to reach across body, wants to go kayaking and play tennis (hasn't started lessons)    Currently in Pain?  Yes    Pain Score  2  at worst: 6/10    Pain Location  Shoulder    Pain Orientation  Right    Pain Descriptors / Indicators  Tightness    Pain Type  Acute pain    Pain Onset  In the past 7 days     Pain Frequency  Intermittent    Aggravating Factors   reaching across body, reaching behind back    Pain Relieving Factors  avoiding provoking motions         [facility] PT Assessment - 06/12/17 1240      Assessment   Medical Diagnosis  M75.41 (ICD-10-CM) - Impingement syndrome of right shoulder    Referring Provider  Martinique, [name], MD    Onset Date/Surgical Date  06/08/17    Hand Dominance  Right    Next MD Visit  PRN    Prior Therapy  none recently      Precautions   Precautions  None      Restrictions   Weight Bearing Restrictions  No      Balance Screen   Has the patient fallen in the past 6 months  No    Has the patient had a decrease in activity level because of a fear of falling?   No    Is the patient reluctant to leave their home because of a fear of falling?   No  Home Environment   Living Environment  Private residence    Living Arrangements  Spouse/significant other      Prior Function   Level of Independence  Independent    Vocation  Retired    Biomedical scientist  retired from [employer]"    Leisure  kayaking, tennis, yardwork      Cognition   Overall Cognitive Status  Within [place] for tasks assessed needs redirection      Observation/Other Assessments   Focus on Therapeutic Outcomes (FOTO)   59 (41% limited; predicted 26% limited)      Posture/Postural Control   Posture/Postural Control  Postural limitations    Postural Limitations  Rounded Shoulders;Forward head      ROM / Strength   AROM / PROM / Strength  AROM;Strength      AROM   Overall AROM Comments  Rt shoulder WNL except: functional IR to L/34 and horizontal adduction to top of Lt shoulder      Strength   Strength Assessment Site  Shoulder;Elbow    Right/Left Shoulder  Right;Left    Right Shoulder Flexion  3/5    Right Shoulder ABduction  3/5    Right Shoulder Internal Rotation  3/5    Right Shoulder External Rotation  3/5    Left Shoulder Flexion  5/5    Left  Shoulder ABduction  5/5    Left Shoulder Internal Rotation  5/5    Left Shoulder External Rotation  4/5    Right/Left Elbow  Right;Left    Right Elbow Flexion  5/5    Right Elbow Extension  3+/5    Left Elbow Flexion  5/5    Left Elbow Extension  5/5      Palpation   Palpation comment  tenderness noted at supraspinatus insertion      Special Tests    Special Tests  Rotator Cuff Impingement    Rotator Cuff Impingment tests  Michel Bickers test      Hawkins-Kennedy test   Findings  Positive    Side  Right                   [facility] Adult PT Treatment/Exercise - 06/12/17 1240      Exercises   Exercises  Shoulder      Shoulder Exercises: Seated   Retraction  5 reps    Other Seated Exercises  shoulder rolls backward x 10 reps      Shoulder Exercises: Stretch   Internal Rotation Stretch  1 rep    Internal Rotation Stretch Limitations  30 sec with towel                  PT Long Term Goals - 06/12/17 1334      PT LONG TERM GOAL #1   Title  independent with HEP    Status  New    Target Date  07/24/17      PT LONG TERM GOAL #2   Title  improve Rt shoulder horizontal adduction to scapular spine on Lt for improved function    Status  New    Target Date  07/24/17      PT LONG TERM GOAL #3   Title  improve seated functional internal rotation to at least T10/11 for improved ADLs and function    Status  New    Target Date  07/24/17      PT LONG TERM GOAL #4   Title  demonstrate 4/5 Rt shoulder strength for  improved function    Status  New    Target Date  07/24/17      PT LONG TERM GOAL #5   Title  report pain < 3/10 with activity for decreased pain and improved function    Status  New    Target Date  07/24/17            Plan - 06/12/17 1300    Clinical Impression Statement  Pt is a 72 y/o male who presents to OPPT for Rt shoulder pain x 4 days.  Clinical findings consistent with RTC impingement including decrease ROM and strength, as well  as pain and postural abnormalities.  Pt will benefit from PT to address deficits listed.    History and Personal Factors relevant to plan of care:  seizures    Clinical Presentation  Stable    Clinical Decision Making  Low    Rehab Potential  Good    PT Frequency  2x / week    PT Duration  6 weeks    PT Treatment/Interventions  ADLs/Self Care Home Management;Cryotherapy;Ultrasound;Moist Heat;Iontophoresis 4mg /ml Dexamethasone;Functional mobility training;Therapeutic activities;Therapeutic exercise;Patient/family education;Manual techniques;Taping;Dry needling;Vasopneumatic Device    PT Next Visit Plan  review HEP and progress strengthening/scap stabilization exercises, ?ionto with hx 1 seizure a year ago; manual/heat/ice PRN    PT Home Exercise Plan  Access Code: [email or web address]    Consulted and Agree with Plan of Care  Patient       Patient will benefit from skilled therapeutic intervention in order to improve the following deficits and impairments:  Increased fascial restricitons, Increased muscle spasms, Pain, Postural dysfunction, Decreased range of motion, Decreased strength  Visit Diagnosis: Acute pain of right shoulder - Plan: PT plan of care cert/re-cert  Stiffness of right shoulder, not elsewhere classified - Plan: PT plan of care cert/re-cert  Abnormal posture - Plan: PT plan of care cert/re-cert     Problem List Patient Active Problem List   Diagnosis Date Noted  . Vitamin D deficiency, unspecified 01/30/2017  . Osteoporosis 10/23/2016  . Benign essential hypertension 10/23/2016  . Complex partial epilepsy ([place]) 09/21/2015  . Status epilepticus ([place]) 09/16/2015      Laureen Abrahams, PT, DPT 06/12/17 1:38 PM    [facility] [address] [place], [place], [address] Phone: [phone]   Fax:  [phone]  Name: Taysom Glymph MRN: [id number] Date of Birth: 17-Jun-1945

## 2017-06-16 ENCOUNTER — Ambulatory Visit: Payer: Medicare Other

## 2017-06-16 DIAGNOSIS — R293 Abnormal posture: Secondary | ICD-10-CM

## 2017-06-16 DIAGNOSIS — M25611 Stiffness of right shoulder, not elsewhere classified: Secondary | ICD-10-CM

## 2017-06-16 DIAGNOSIS — M25511 Pain in right shoulder: Secondary | ICD-10-CM | POA: Diagnosis not present

## 2017-06-16 NOTE — Patient Instructions (Addendum)
Access Code: [email or web address]  URL: [email or web address]  Date: 06/16/2017  Prepared by: Sigurd Sos   Exercises  Standing Shoulder Row with Anchored Resistance - 10 reps - 2 sets - 2x daily - 7x weekly  Shoulder Extension with Resistance - Palms Forward - 10 reps - 2 sets - 2x daily - 7x weekly  Supine Shoulder Horizontal Abduction with Resistance - 10 reps - 2 sets - 2x daily - 7x weekly  Supine Bilateral Shoulder External Rotation with Resistance - 10 reps - 2 sets - 2x daily - 7x weekly  Supine PNF D2 Flexion with Resistance - 10 reps - 2 sets - 2x daily - 7x weekly    [facility] [address] [place], [place] [address] Phone # [phone] Fax [phone]

## 2017-06-16 NOTE — Therapy (Signed)
[facility] [address] [place], [place], [address] Phone: [phone]   Fax:  [phone]  Physical Therapy Treatment  Patient Details  Name: Samuel Mayo MRN: [id number] Date of Birth: January 27, 1946 Referring Provider: Martinique, [name], MD   Encounter Date: 06/16/2017  PT End of Session - 06/16/17 1446    Visit Number  2    Number of Visits  12    Date for PT Re-Evaluation  07/24/17    Authorization Type  UHC Medicare $40 copay    PT Start Time  1400    PT Stop Time  1443    PT Time Calculation (min)  43 min    Activity Tolerance  Patient tolerated treatment well    Behavior During Therapy  [facility] for tasks assessed/performed       Past Medical History:  Diagnosis Date  . [place] (actinic keratosis)   . Allergy   . Chicken pox   . Compression fracture of L1 lumbar vertebra (HCC)   . Compression fracture of thoracic vertebra (HCC)   . Hypertension   . Lentigines    Follows with DR Culton (derma)  . Seizures ([place])     Past Surgical History:  Procedure Laterality Date  . mitral vavle     Congetinal mitral valve disease.    There were no vitals filed for this visit.  Subjective Assessment - 06/16/17 1403    Subjective  I was sore after the evaluation.  I was able to do the exercises a day or 2 after.      Currently in Pain?  No/denies    Pain Location  Shoulder    Pain Orientation  Right    Pain Descriptors / Indicators  Tightness                       [facility] Adult PT Treatment/Exercise - 06/16/17 0001      Shoulder Exercises: Supine   Horizontal ABduction  Strengthening;Both;20 reps;Theraband    Theraband Level (Shoulder Horizontal ABduction)  Level 3 (Green)    External Rotation  Strengthening;Both;20 reps;Theraband    Theraband Level (Shoulder External Rotation)  Level 3 (Green)    Diagonals  Strengthening;Both;20 reps;Theraband    Theraband Level (Shoulder Diagonals)  Level 3 (Green)       Shoulder Exercises: Standing   Extension  Strengthening;Both;20 reps;Theraband    Theraband Level (Shoulder Extension)  Level 3 (Green)    Row  Strengthening;Both;20 reps;Theraband      Shoulder Exercises: Pulleys   Flexion  3 minutes      Shoulder Exercises: ROM/Strengthening   UBE (Upper Arm Bike)  Level 2x 6 minutes in reverse       Shoulder Exercises: Stretch   Internal Rotation Stretch Limitations  30 sec with towel             PT Education - 06/16/17 1445    Education provided  Yes    Education Details  supine and standing HEP with theraband  Access Code: [email or web address]     Person(s) Educated  Patient    Methods  Explanation;Demonstration    Comprehension  Verbalized understanding;Returned demonstration          PT Long Term Goals - 06/12/17 1334      PT LONG TERM GOAL #1   Title  independent with HEP    Status  New    Target Date  07/24/17      PT LONG TERM GOAL #  2   Title  improve Rt shoulder horizontal adduction to scapular spine on Lt for improved function    Status  New    Target Date  07/24/17      PT LONG TERM GOAL #3   Title  improve seated functional internal rotation to at least T10/11 for improved ADLs and function    Status  New    Target Date  07/24/17      PT LONG TERM GOAL #4   Title  demonstrate 4/5 Rt shoulder strength for improved function    Status  New    Target Date  07/24/17      PT LONG TERM GOAL #5   Title  report pain < 3/10 with activity for decreased pain and improved function    Status  New    Target Date  07/24/17            Plan - 06/16/17 1405    Clinical Impression Statement  Pt with only 1 session after evaluation.  PT focused on scapular strength and flexibility to reduce symptoms of impingement.  Pt reports that he had pain after last session and had to rest a day or 2 before being able to do his exercises.  Pt required minor verbal cues for scapular position with exercises. PT issuced HEP for scapular  strength.  Pt will continue to benefit from skilled PT for Rt shoulder strength, flexibility and postural corrections.      Rehab Potential  Good    PT Frequency  2x / week    PT Duration  6 weeks    PT Treatment/Interventions  ADLs/Self Care Home Management;Cryotherapy;Ultrasound;Moist Heat;Iontophoresis 4mg /ml Dexamethasone;Functional mobility training;Therapeutic activities;Therapeutic exercise;Patient/family education;Manual techniques;Taping;Dry needling;Vasopneumatic Device    PT Next Visit Plan  review HEP, strength, flexibility of Rt shoulder     PT Home Exercise Plan  Access Code: [email or web address]    Consulted and Agree with Plan of Care  Patient       Patient will benefit from skilled therapeutic intervention in order to improve the following deficits and impairments:  Increased fascial restricitons, Increased muscle spasms, Pain, Postural dysfunction, Decreased range of motion, Decreased strength  Visit Diagnosis: Acute pain of right shoulder  Stiffness of right shoulder, not elsewhere classified  Abnormal posture     Problem List Patient Active Problem List   Diagnosis Date Noted  . Vitamin D deficiency, unspecified 01/30/2017  . Osteoporosis 10/23/2016  . Benign essential hypertension 10/23/2016  . Complex partial epilepsy ([place]) 09/21/2015  . Status epilepticus ([place]) 09/16/2015     Sigurd Sos, PT 06/16/17 2:49 PM  [facility] [address] [place], [place], [address] Phone: [phone]   Fax:  [phone]  Name: Samuel Mayo MRN: [id number] Date of Birth: June 22, 1945

## 2017-06-18 ENCOUNTER — Ambulatory Visit: Payer: Medicare Other

## 2017-06-18 DIAGNOSIS — M25611 Stiffness of right shoulder, not elsewhere classified: Secondary | ICD-10-CM

## 2017-06-18 DIAGNOSIS — R293 Abnormal posture: Secondary | ICD-10-CM

## 2017-06-18 DIAGNOSIS — M25511 Pain in right shoulder: Secondary | ICD-10-CM

## 2017-06-18 NOTE — Therapy (Signed)
[facility] [address], [place], [place], [address] Phone: [phone]   Fax:  [phone]  Physical Therapy Treatment  Patient Details  Name: Samuel Mayo MRN: [id number] Date of Birth: [birth year] Referring Provider: [place]ique, [name], MD   Encounter Date: 06/18/2017  PT End of Session - 06/18/17 1521    Visit Number  3    Date for PT Re-Evaluation  07/24/17    Authorization Type  UHC Medicare $40 copay    PT Start Time  1444    PT Stop Time  1524    PT Time Calculation (min)  40 min    Activity Tolerance  Patient tolerated treatment well    Behavior During Therapy  [facility] for tasks assessed/performed       Past Medical History:  Diagnosis Date  . [place] (actinic keratosis)   . Allergy   . Chicken pox   . Compression fracture of L1 lumbar vertebra (HCC)   . Compression fracture of thoracic vertebra (HCC)   . Hypertension   . Lentigines    Follows with DR Culton (derma)  . Seizures ([place])     Past Surgical History:  Procedure Laterality Date  . mitral vavle     Congetinal mitral valve disease.    There were no vitals filed for this visit.  Subjective Assessment - 06/18/17 1448    Subjective  I was doing yardwork yesterday.  I didn't have a lot of time to do my exercises.      Patient Stated Goals  be able to reach across body, wants to go kayaking and play tennis (hasn't started lessons)    Currently in Pain?  No/denies                       [facility] Adult PT Treatment/Exercise - 06/18/17 0001      Shoulder Exercises: Supine   Horizontal ABduction  Strengthening;Both;20 reps;Theraband    Theraband Level (Shoulder Horizontal ABduction)  Level 3 (Green)    External Rotation  Strengthening;Both;20 reps;Theraband    Theraband Level (Shoulder External Rotation)  Level 3 (Green)    Diagonals  Strengthening;Both;20 reps;Theraband    Theraband Level (Shoulder Diagonals)  Level 3 (Green)      Shoulder Exercises: Sidelying   External Rotation  Strengthening;Right;20 reps;Weights    External Rotation Weight (lbs)  3      Shoulder Exercises: Standing   Extension  Strengthening;Both;20 reps;Theraband    Theraband Level (Shoulder Extension)  Level 3 (Green)    Row  Strengthening;Both;20 reps;Theraband    Other Standing Exercises  snow angels on wall x 10    Other Standing Exercises  wall push ups 2x10      Shoulder Exercises: Pulleys   Other Pulley Exercises  IR x 2 minutes      Shoulder Exercises: ROM/Strengthening   UBE (Upper Arm Bike)  Level 2x 6 minutes in reverse                   PT Long Term Goals - 06/12/17 1334      PT LONG TERM GOAL #1   Title  independent with HEP    Status  New    Target Date  07/24/17      PT LONG TERM GOAL #2   Title  improve Rt shoulder horizontal adduction to scapular spine on Lt for improved function    Status  New    Target Date  07/24/17  PT LONG TERM GOAL #3   Title  improve seated functional internal rotation to at least T10/11 for improved ADLs and function    Status  New    Target Date  07/24/17      PT LONG TERM GOAL #4   Title  demonstrate 4/5 Rt shoulder strength for improved function    Status  New    Target Date  07/24/17      PT LONG TERM GOAL #5   Title  report pain < 3/10 with activity for decreased pain and improved function    Status  New    Target Date  07/24/17            Plan - 06/18/17 1451    Clinical Impression Statement  Pt is able to reach across the body with his Rt shoulder to put on his deodorant.  Pt hasn't been doing exercises regularly due to being busy at home.  Pt requires verbal and tactile cues for technique with theraband exercises.  Pt will continue to benefit from skilled PT for scapular strength, shoulder flexibility and pain management as needed.      Rehab Potential  Good    PT Frequency  2x / week    PT Duration  6 weeks    PT Treatment/Interventions  ADLs/Self  Care Home Management;Cryotherapy;Ultrasound;Moist Heat;Iontophoresis 4mg /ml Dexamethasone;Functional mobility training;Therapeutic activities;Therapeutic exercise;Patient/family education;Manual techniques;Taping;Dry needling;Vasopneumatic Device    PT Next Visit Plan  review HEP, strength, flexibility of Rt shoulder     PT Home Exercise Plan  Access Code: [email or web address]    Consulted and Agree with Plan of Care  Patient       Patient will benefit from skilled therapeutic intervention in order to improve the following deficits and impairments:  Increased fascial restricitons, Increased muscle spasms, Pain, Postural dysfunction, Decreased range of motion, Decreased strength  Visit Diagnosis: Acute pain of right shoulder  Stiffness of right shoulder, not elsewhere classified  Abnormal posture     Problem List Patient Active Problem List   Diagnosis Date Noted  . Vitamin D deficiency, unspecified 01/30/2017  . Osteoporosis 10/23/2016  . Benign essential hypertension 10/23/2016  . Complex partial epilepsy ([place]) 09/21/2015  . Status epilepticus ([place]) 09/16/2015     Sigurd Sos, PT 06/18/17 3:28 PM  [facility] [address] [place], [place], [address] Phone: [phone]   Fax:  [phone]  Name: Merlon Alcorta MRN: [id number] Date of Birth: [birth year]

## 2017-06-24 ENCOUNTER — Ambulatory Visit: Payer: Medicare Other

## 2017-06-24 DIAGNOSIS — M25511 Pain in right shoulder: Secondary | ICD-10-CM

## 2017-06-24 DIAGNOSIS — M25611 Stiffness of right shoulder, not elsewhere classified: Secondary | ICD-10-CM

## 2017-06-24 DIAGNOSIS — R293 Abnormal posture: Secondary | ICD-10-CM

## 2017-06-24 NOTE — Therapy (Signed)
[facility] [address] [place], [place], [address] Phone: [phone]   Fax:  [phone]  Physical Therapy Treatment  Patient Details  Name: Samuel Mayo MRN: [id number] Date of Birth: 22-Aug-1945 Referring Provider: Martinique, [name], MD   Encounter Date: 06/24/2017  PT End of Session - 06/24/17 1048    Visit Number  4    Authorization Type  [employer] Medicare $40 copay    PT Start Time  1016    PT Stop Time  1048    PT Time Calculation (min)  32 min    Activity Tolerance  Patient tolerated treatment well    Behavior During Therapy  [facility] for tasks assessed/performed       Past Medical History:  Diagnosis Date  . AK (actinic keratosis)   . Allergy   . Chicken pox   . Compression fracture of L1 lumbar vertebra (HCC)   . Compression fracture of thoracic vertebra (HCC)   . Hypertension   . Lentigines    Follows with DR Culton (derma)  . Seizures ([place])     Past Surgical History:  Procedure Laterality Date  . mitral vavle     Congetinal mitral valve disease.    There were no vitals filed for this visit.  Subjective Assessment - 06/24/17 1021    Subjective  I am ready to D/C.  I am very busy with putting my house on the market and travel.      Patient Stated Goals  be able to reach across body, wants to go kayaking and play tennis (hasn't started lessons)    Currently in Pain?  No/denies         [facility] PT Assessment - 06/24/17 0001      Assessment   Medical Diagnosis  M75.41 (ICD-10-CM) - Impingement syndrome of right shoulder      Cognition   Overall Cognitive Status  Within Functional Limits for tasks assessed needs redirection      Observation/Other Assessments   Focus on Therapeutic Outcomes (FOTO)   8% limitation      Posture/Postural Control   Posture/Postural Control  Postural limitations    Postural Limitations  Rounded Shoulders;Forward head      AROM   Overall AROM Comments  IR to T5      Strength   Right Shoulder Flexion  4+/5    Right Shoulder ABduction  4/5    Right Shoulder Internal Rotation  4+/5    Right Shoulder External Rotation  4/5                   [facility] Adult PT Treatment/Exercise - 06/24/17 0001      Shoulder Exercises: Supine   Horizontal ABduction  Strengthening;Both;20 reps;Theraband    Theraband Level (Shoulder Horizontal ABduction)  Level 3 (Green)    External Rotation  Strengthening;Both;20 reps;Theraband    Theraband Level (Shoulder External Rotation)  Level 3 (Green)    Diagonals  Strengthening;Both;20 reps;Theraband    Theraband Level (Shoulder Diagonals)  Level 3 (Green)      Shoulder Exercises: Pulleys   Other Pulley Exercises  IR x 2 minutes      Shoulder Exercises: ROM/Strengthening   UBE (Upper Arm Bike)  Level 2x 6 minutes in reverse                   PT Long Term Goals - 06/24/17 1022      PT LONG TERM GOAL #1   Title  independent with HEP    Status  Achieved      PT LONG TERM GOAL #2   Title  improve Rt shoulder horizontal adduction to scapular spine on Lt for improved function    Status  Achieved      PT LONG TERM GOAL #3   Title  improve seated functional internal rotation to at least T10/11 for improved ADLs and function    Baseline  T5    Status  Achieved      PT LONG TERM GOAL #4   Title  demonstrate 4/5 Rt shoulder strength for improved function    Status  Achieved      PT LONG TERM GOAL #5   Title  report pain < 3/10 with activity for decreased pain and improved function    Status  Achieved            Plan - 06/24/17 1036    Clinical Impression Statement  Pt requested to D/C to HEP and has met all goals.  Pt is now able to reach across his body to put on deodorant.  IR is improved to T5 and FOTO is 8% limitation.  Pt has HEP in place for postural strength progression and flexibility.      PT Next Visit Plan  D/C PT to HEP    PT Home Exercise Plan  Access Code: [email or web address]     Recommended Other Services  initial order is signed    Consulted and Agree with Plan of Care  Patient       Patient will benefit from skilled therapeutic intervention in order to improve the following deficits and impairments:     Visit Diagnosis: Acute pain of right shoulder  Stiffness of right shoulder, not elsewhere classified  Abnormal posture     Problem List Patient Active Problem List   Diagnosis Date Noted  . Vitamin D deficiency, unspecified 01/30/2017  . Osteoporosis 10/23/2016  . Benign essential hypertension 10/23/2016  . Complex partial epilepsy ([place]) 09/21/2015  . Status epilepticus ([place]) 09/16/2015   PHYSICAL THERAPY DISCHARGE SUMMARY  Visits from Start of Care: 4  Current functional level related to goals / functional outcomes: Pt has met all goals and will be discharged at this time to HEP.   Remaining deficits: No functional deficits remain.    Education / Equipment: HEP Plan: Patient agrees to discharge.  Patient goals were met. Patient is being discharged due to meeting the stated rehab goals.  ?????         Sigurd Sos, PT 06/24/17 10:49 AM  [facility] [address], [place], [place], [address] Phone: [phone]   Fax:  [phone]  Name: Fin Hupp MRN: [id number] Date of Birth: 02/09/1946

## 2017-06-26 ENCOUNTER — Encounter: Payer: Medicare Other | Admitting: Physical Therapy

## 2017-07-03 ENCOUNTER — Encounter: Payer: Medicare Other | Admitting: Physical Therapy

## 2017-09-04 ENCOUNTER — Other Ambulatory Visit: Payer: Self-pay | Admitting: Family Medicine

## 2017-09-04 DIAGNOSIS — I10 Essential (primary) hypertension: Secondary | ICD-10-CM

## 2017-10-11 ENCOUNTER — Other Ambulatory Visit: Payer: Self-pay | Admitting: Family Medicine

## 2017-10-11 DIAGNOSIS — I10 Essential (primary) hypertension: Secondary | ICD-10-CM

## 2017-10-20 ENCOUNTER — Telehealth: Payer: Self-pay | Admitting: Family Medicine

## 2017-10-20 NOTE — Telephone Encounter (Signed)
Patient states he used to get 90 day supplies of his medication (Amlodipine), however in July and then today he got refills for 30 day supplies with today not having a refill.  He wants to start getting 90 day supplies of his medication again.  He said if a prescription for 90 days could be called in to [facility] at [address] he would just pick it up in 30 days when the medication he picked up today runs out.

## 2017-10-21 ENCOUNTER — Other Ambulatory Visit: Payer: Self-pay | Admitting: *Deleted

## 2017-10-21 DIAGNOSIS — M816 Localized osteoporosis [Lequesne]: Secondary | ICD-10-CM

## 2017-10-21 DIAGNOSIS — I10 Essential (primary) hypertension: Secondary | ICD-10-CM

## 2017-10-21 MED ORDER — IBANDRONATE SODIUM 150 MG PO TABS: 150.0000 mg | ORAL_TABLET | ORAL | 3 refills | Status: DC

## 2017-10-21 MED ORDER — AMLODIPINE BESYLATE 5 MG PO TABS: 5.0000 mg | ORAL_TABLET | Freq: Every day | ORAL | 2 refills | Status: DC

## 2017-10-21 NOTE — Telephone Encounter (Signed)
Left message for patient to return call concerning medication.

## 2017-10-21 NOTE — Telephone Encounter (Signed)
90 day supply of medication sent to pharmacy as requested. Spoke with patient and he stated that he was told to return in year for physical, confirmed with last OV notes.

## 2017-10-21 NOTE — Telephone Encounter (Signed)
Patient is requesting a call back in regards to his medication .please advise

## 2017-10-24 ENCOUNTER — Other Ambulatory Visit: Payer: Self-pay | Admitting: Family Medicine

## 2017-10-24 DIAGNOSIS — M816 Localized osteoporosis [Lequesne]: Secondary | ICD-10-CM

## 2017-11-13 ENCOUNTER — Encounter: Payer: Self-pay | Admitting: Family Medicine

## 2017-11-13 ENCOUNTER — Ambulatory Visit (INDEPENDENT_AMBULATORY_CARE_PROVIDER_SITE_OTHER): Payer: Medicare Other | Admitting: Family Medicine

## 2017-11-13 VITALS — BP 120/88 | HR 90 | Temp 98.5°F | Ht 73.0 in | Wt 205.2 lb

## 2017-11-13 DIAGNOSIS — M79672 Pain in left foot: Secondary | ICD-10-CM

## 2017-11-13 MED ORDER — NAPROXEN 500 MG PO TABS: 500.0000 mg | ORAL_TABLET | Freq: Two times a day (BID) | ORAL | 0 refills | Status: DC

## 2017-11-13 NOTE — Progress Notes (Signed)
HPI:  Using dictation device. Unfortunately this device frequently misinterprets words/phrases.  Acute visit for L foot pain: -started about 4 days ago acutely, now improving some -symptoms include pain and some swelling in R 1st MTP jt region -no fevers, malaise, known injury, prior occurrence  ROS: See pertinent positives and negatives per HPI.  Past Medical History:  Diagnosis Date  . AK (actinic keratosis)   . Allergy   . Chicken pox   . Compression fracture of L1 lumbar vertebra (HCC)   . Compression fracture of thoracic vertebra (HCC)   . Hypertension   . Lentigines    Follows with DR Culton (derma)  . Seizures ([place])     Past Surgical History:  Procedure Laterality Date  . mitral vavle     Congetinal mitral valve disease.    Family History  Problem Relation Age of Onset  . Heart disease Mother   . Cancer Father   . Heart disease Father   . Aneurysm Paternal Grandfather   . Heart disease Son        congenital mitral valve dz    SOCIAL HX: see hpi   Current Outpatient Medications:  .  amLODipine (NORVASC) 5 MG tablet, Take 1 tablet (5 mg total) by mouth daily., Disp: 90 tablet, Rfl: 2 .  aspirin EC 81 MG tablet, Take 81 mg by mouth daily., Disp: , Rfl:  .  Cholecalciferol (VITAMIN D3) 10000 units TABS, Take 1 tablet by mouth daily., Disp: , Rfl:  .  ibandronate (BONIVA) 150 MG tablet, TAKE 1 TAB BY MOUTH EVERY 30 DAYS IN THE MORNING W/FULL GLASS OF H2O *DONT TAKE/LIE DOWN X30 MINUTES, Disp: 3 tablet, Rfl: 3 .  levETIRAcetam (KEPPRA) 750 MG tablet, Take 750 mg by mouth 2 (two) times daily., Disp: , Rfl:  .  Multiple Vitamin (MULTIVITAMIN WITH MINERALS) TABS tablet, Take 1 tablet by mouth See admin instructions. Take 1 tablet by mouth every 2-3 days, Disp: , Rfl:  .  naproxen (NAPROSYN) 500 MG tablet, Take 1 tablet (500 mg total) by mouth 2 (two) times daily with a meal., Disp: 20 tablet, Rfl: 0  EXAM:  Vitals:   11/13/17 1055  BP: 120/88  Pulse: 90   Temp: 98.5 F (36.9 C)    Body mass index is 27.07 kg/m.  GENERAL: vitals reviewed and listed above, alert, oriented, appears well hydrated and in no acute distress  HEENT: atraumatic, conjunttiva clear, no obvious abnormalities on inspection of external nose and ears  NECK: no obvious masses on inspection  LUNGS: clear to auscultation bilaterally, no wheezes, rales or rhonchi, good air movement  CV: HRRR, no peripheral edema  MS: moves all extremities without noticeable abnormality, has mild swelling and TTP L MTP joint region, normal cap refill, normal strength throughout in bilat feet, NV intact  PSYCH: pleasant and cooperative, no obvious depression or anxiety  ASSESSMENT AND PLAN:  Discussed the following assessment and plan:  Left foot pain  -we discussed possible serious and likely etiologies, workup and treatment, treatment risks and return precautions - suspect OA vs gout most likely -after this discussion, Durante opted for short course naproxen, modification of activities, follow up in 2-3 weeks and check uric acid then, also need BP recheck then - her reports is has always been elevated. Better on recheck. Discusse potential for possible increased bleeding risk with asa and nsaids- reports he decided to take the asa and does not take for any medical issue so he plans to hold while  on the nsaids. -of course, we advised Zalan  to return or notify a doctor immediately if symptoms worsen or persist or new concerns arise.  Patient Instructions  BEFORE YOU LEAVE: -follow up: 2 weeks  Take the naproxen as instructed for 5-7 days or until symptoms have subsided for 2 days.  Go gentle on the walking until feeling better.  I hope you are feeling better soon! Follow up sooner if your symptoms worsen or new concern arise.      Lucretia Kern, DO

## 2017-11-13 NOTE — Patient Instructions (Signed)
BEFORE YOU LEAVE: -follow up: 2 weeks  Take the naproxen as instructed for 5-7 days or until symptoms have subsided for 2 days.  Go gentle on the walking until feeling better.  I hope you are feeling better soon! Follow up sooner if your symptoms worsen or new concern arise.

## 2017-11-26 ENCOUNTER — Encounter: Payer: Self-pay | Admitting: Family Medicine

## 2017-11-26 ENCOUNTER — Ambulatory Visit (INDEPENDENT_AMBULATORY_CARE_PROVIDER_SITE_OTHER): Payer: Medicare Other | Admitting: Family Medicine

## 2017-11-26 DIAGNOSIS — M109 Gout, unspecified: Secondary | ICD-10-CM | POA: Insufficient documentation

## 2017-11-26 MED ORDER — COLCHICINE 0.6 MG PO TABS: ORAL_TABLET | ORAL | 0 refills | Status: DC

## 2017-11-26 NOTE — Assessment & Plan Note (Signed)
Problem has resolved but history suggest episode of acute gout.  Educated about diagnosis and treatment options. Low purine diet recommended. At this time, given the fact he is asymptomatic I do not think  We need to check uric acid today but we will add it to his next lab work in 02/2018 or before if needed. If he has recurrent episodes despite low purine diet we need to consider prophylactic treatment with allopurinol.

## 2017-11-26 NOTE — Progress Notes (Signed)
HPI:   Samuel Mayo is a 72 y.o. male, who is here today to follow on recent OV.   He was seen for acute visit on 11/13/2017, complaining of left foot pain. Naproxen 500 mg twice daily were recommended. According to patient, he woke up around 1 am with sudden onset of constant pain in first left MTP joint pain,edema,and erythema.  She describes pain as "enough to wake me up." No history of trauma. No fever, chills, numbness, or cyanosis.  No history of gout. Symptoms improved greatly 2 to 3 days after starting naproxen and now have resolved completely.  Occasionally he drinks beer but has not done more than usual.   Review of Systems  Constitutional: Negative for chills, fatigue and fever.  Respiratory: Negative for shortness of breath and wheezing.   Cardiovascular: Negative for chest pain and leg swelling.  Genitourinary: Negative for decreased urine volume, dysuria and hematuria.  Musculoskeletal: Positive for arthralgias and joint swelling. Negative for back pain.  Neurological: Negative for weakness and numbness.      Current Outpatient Medications on File Prior to Visit  Medication Sig Dispense Refill  . amLODipine (NORVASC) 5 MG tablet Take 1 tablet (5 mg total) by mouth daily. 90 tablet 2  . aspirin EC 81 MG tablet Take 81 mg by mouth daily.    . Cholecalciferol (VITAMIN D3) 10000 units TABS Take 1 tablet by mouth daily.    Marland Kitchen ibandronate (BONIVA) 150 MG tablet TAKE 1 TAB BY MOUTH EVERY 30 DAYS IN THE MORNING W/FULL GLASS OF H2O *DONT TAKE/LIE DOWN X30 MINUTES 3 tablet 3  . levETIRAcetam (KEPPRA) 750 MG tablet Take 750 mg by mouth 2 (two) times daily.    . Multiple Vitamin (MULTIVITAMIN WITH MINERALS) TABS tablet Take 1 tablet by mouth See admin instructions. Take 1 tablet by mouth every 2-3 days    . naproxen (NAPROSYN) 500 MG tablet Take 1 tablet (500 mg total) by mouth 2 (two) times daily with a meal. 20 tablet 0  . Protein (NUTRA/PRO VANILLA) PACK Take by  mouth.    . triamcinolone ointment (KENALOG) 0.1 % Apply twice daily to involved areas     No current facility-administered medications on file prior to visit.      Past Medical History:  Diagnosis Date  . AK (actinic keratosis)   . Allergy   . Chicken pox   . Compression fracture of L1 lumbar vertebra (HCC)   . Compression fracture of thoracic vertebra (HCC)   . Hypertension   . Lentigines    Follows with DR Culton (derma)  . Seizures (HCC)    Allergies  Allergen Reactions  . Other Other (See Comments)    Neosporin Ophthalmic causes eyes to turn red    Social History   Socioeconomic History  . Marital status: Widowed    Spouse name: Not on file  . Number of children: Not on file  . Years of education: Not on file  . Highest education level: Not on file  Occupational History  . Not on file  Social Needs  . Financial resource strain: Not on file  . Food insecurity:    Worry: Not on file    Inability: Not on file  . Transportation needs:    Medical: Not on file    Non-medical: Not on file  Tobacco Use  . Smoking status: Never Smoker  . Smokeless tobacco: Never Used  Substance and Sexual Activity  . Alcohol use: Yes  Alcohol/week: 1.0 standard drinks    Types: 1 Cans of beer per week    Comment: socially  . Drug use: No  . Sexual activity: Not on file  Lifestyle  . Physical activity:    Days per week: Not on file    Minutes per session: Not on file  . Stress: Not on file  Relationships  . Social connections:    Talks on phone: Not on file    Gets together: Not on file    Attends religious service: Not on file    Active member of club or organization: Not on file    Attends meetings of clubs or organizations: Not on file    Relationship status: Not on file  Other Topics Concern  . Not on file  Social History Narrative  . Not on file    Vitals:   11/26/17 1431  BP: 124/80  Pulse: 95  Resp: 12  Temp: 97.9 F (36.6 C)  SpO2: 98%   Body mass  index is 27.21 kg/m.   Physical Exam  Constitutional: He is oriented to person, place, and time. He appears well-developed. No distress.  HENT:  Head: Normocephalic and atraumatic.  Mouth/Throat: Oropharynx is clear and moist and mucous membranes are normal.  Eyes: Conjunctivae and EOM are normal.  Cardiovascular: Normal rate and regular rhythm.  Pulses:      Dorsalis pedis pulses are 2+ on the right side.       Posterior tibial pulses are 2+ on the right side.  Respiratory: Effort normal. No respiratory distress.  GI: There is no hepatomegaly.  Musculoskeletal: He exhibits no edema or tenderness.       Left foot: There is normal range of motion, no tenderness, no swelling and normal capillary refill.  Neurological: He is alert and oriented to person, place, and time. He has normal strength. Gait normal.  Skin: Skin is warm. No rash noted. No erythema.  Psychiatric: He has a normal mood and affect.  Well groomed,good eye contact.    ASSESSMENT AND PLAN:  Samuel Mayo was seen today for follow-up.  Diagnoses and all orders for this visit:  Gout, arthritis- R MTP joint. Problem has resolved but history suggest episode of acute gout.  Educated about diagnosis and treatment options. Low purine diet recommended. At this time, given the fact he is asymptomatic I do not think  We need to check uric acid today but we will add it to his next lab work in 02/2018 or before if needed. If he has recurrent episodes despite low purine diet we need to consider prophylactic treatment with allopurinol.       Trenity Pha G. Martinique, MD  [facility]. [place] office.

## 2018-02-02 ENCOUNTER — Encounter: Payer: Self-pay | Admitting: Family Medicine

## 2018-02-02 ENCOUNTER — Ambulatory Visit (INDEPENDENT_AMBULATORY_CARE_PROVIDER_SITE_OTHER): Payer: Medicare Other | Admitting: Family Medicine

## 2018-02-02 VITALS — BP 130/80 | HR 88 | Temp 97.8°F | Resp 12 | Ht 73.0 in | Wt 209.0 lb

## 2018-02-02 DIAGNOSIS — I10 Essential (primary) hypertension: Secondary | ICD-10-CM

## 2018-02-02 DIAGNOSIS — E559 Vitamin D deficiency, unspecified: Secondary | ICD-10-CM

## 2018-02-02 DIAGNOSIS — Z23 Encounter for immunization: Secondary | ICD-10-CM | POA: Diagnosis not present

## 2018-02-02 DIAGNOSIS — Z0000 Encounter for general adult medical examination without abnormal findings: Secondary | ICD-10-CM | POA: Diagnosis not present

## 2018-02-02 DIAGNOSIS — N401 Enlarged prostate with lower urinary tract symptoms: Secondary | ICD-10-CM

## 2018-02-02 DIAGNOSIS — R739 Hyperglycemia, unspecified: Secondary | ICD-10-CM

## 2018-02-02 DIAGNOSIS — R351 Nocturia: Secondary | ICD-10-CM | POA: Diagnosis not present

## 2018-02-02 DIAGNOSIS — M816 Localized osteoporosis [Lequesne]: Secondary | ICD-10-CM | POA: Diagnosis not present

## 2018-02-02 LAB — COMPREHENSIVE METABOLIC PANEL
ALK PHOS: 46 U/L (ref 39–117)
ALT: 18 U/L (ref 0–53)
AST: 19 U/L (ref 0–37)
Albumin: 4.3 g/dL (ref 3.5–5.2)
BILIRUBIN TOTAL: 0.6 mg/dL (ref 0.2–1.2)
BUN: 21 mg/dL (ref 6–23)
CO2: 28 mEq/L (ref 19–32)
CREATININE: 1.02 mg/dL (ref 0.40–1.50)
Calcium: 9 mg/dL (ref 8.4–10.5)
Chloride: 100 mEq/L (ref 96–112)
GFR: 75.89 mL/min (ref >60.00)
Glucose, Bld: 97 mg/dL (ref 70–99)
POTASSIUM: 4.3 meq/L (ref 3.5–5.1)
SODIUM: 137 meq/L (ref 135–145)
TOTAL PROTEIN: 6.7 g/dL (ref 6.0–8.3)

## 2018-02-02 LAB — PSA: PSA: 2.15 ng/mL (ref 0.10–4.00)

## 2018-02-02 LAB — VITAMIN D 25 HYDROXY (VIT D DEFICIENCY, FRACTURES): VITD: 42.97 ng/mL (ref 30.00–100.00)

## 2018-02-02 LAB — HEMOGLOBIN A1C: HEMOGLOBIN A1C: 5.4 % (ref 4.6–6.5)

## 2018-02-02 MED ORDER — AMLODIPINE BESYLATE 5 MG PO TABS: 5.0000 mg | ORAL_TABLET | Freq: Every day | ORAL | 3 refills | Status: DC

## 2018-02-02 MED ORDER — IBANDRONATE SODIUM 150 MG PO TABS: ORAL_TABLET | ORAL | 3 refills | Status: DC

## 2018-02-02 NOTE — Progress Notes (Signed)
HPI:  [name] is a 72 y.o.male here today for his routine physical examination and AWV.  Last CPE: 01/31/17 He lives with his wife.  Regular exercise 3 or more times per week: Yes. He plays tennis,walks,and hikes, exercises 6 times per week. Following a healthy diet: yes.   Chronic medical problems: Gout,HTN,epilepsy,osteoporosis,and vit D deficiency among some.  Osteoporosis, he is on Boniva 150 mg monthly. Tolerating medication well. He takes vit D and Ca++ supplementation,1000 U daily.  HTN, he is on Amlodipine 5 mg daily. Denies severe/frequent headache, visual changes, chest pain, dyspnea, palpitation, claudication, focal weakness, or edema.  No DM II but he has had some elevated glucose in the past, 172.  Immunization History  Administered Date(s) Administered  . Influenza, High Dose Seasonal PF 12/18/2016, 11/10/2017  . Influenza-Unspecified 12/18/2016  . Pneumococcal Conjugate-13 01/31/2017  . Pneumococcal Polysaccharide-23 02/02/2018  . Tdap 10/05/2013    -Hep C screening: 11/10/14 NR  Last colon cancer screening: Colonoscopy 07/02/09 Last prostate ca screening: 1.94 in 01/2017.  -Denies high alcohol intake, tobacco use, or Hx of illicit drug use.  Independent ADL's and IADL's.   Functional Status Survey: Is the patient deaf or have difficulty hearing?: No Does the patient have difficulty seeing, even when wearing glasses/contacts?: No Does the patient have difficulty concentrating, remembering, or making decisions?: No Does the patient have difficulty walking or climbing stairs?: No Does the patient have difficulty dressing or bathing?: No Does the patient have difficulty doing errands alone such as visiting a doctor's office or shopping?: No  Fall Risk  02/02/2018 01/31/2017 09/21/2015  Falls in the past year? 0 Yes Yes  Number falls in past yr: 0 - 1  Injury with Fall? 0 - Yes  Comment - - compression fracture  Follow up Education provided  - -     Providers he sees regularly:  Eye care provider: In [place], optometrists.Follows periodically due to macular degeneration. Neuro,Dr Starleen Blue. Last visit 11/2017. [facility] dermatologist.,last visit 08/2017  Depression screen PHQ 2/9 02/02/2018  Decreased Interest 0  Down, Depressed, Hopeless 0  PHQ - 2 Score 0    Mini-Cog - 02/02/18 1302    Normal clock drawing test?  yes    How many words correct?  3        Visual Acuity Screening   Right eye Left eye Both eyes  Without correction:     With correction: 20/20 20/20 20/20      Review of Systems  Constitutional: Negative for activity change, appetite change, fatigue and fever.  HENT: Negative for dental problem, nosebleeds, sore throat, trouble swallowing and voice change.   Eyes: Negative for redness and visual disturbance.  Respiratory: Negative for cough, shortness of breath and wheezing.   Cardiovascular: Negative for chest pain, palpitations and leg swelling.  Gastrointestinal: Negative for abdominal pain, blood in stool, nausea and vomiting.  Endocrine: Negative for cold intolerance, heat intolerance, polydipsia, polyphagia and polyuria.  Genitourinary: Negative for decreased urine volume, dysuria, genital sores, hematuria and testicular pain.  Musculoskeletal: Negative for gait problem and myalgias.  Skin: Negative for color change and rash.  Allergic/Immunologic: Positive for environmental allergies.  Neurological: Negative for seizures, syncope, weakness and headaches.  Hematological: Negative for adenopathy. Does not bruise/bleed easily.  Psychiatric/Behavioral: Negative for confusion and sleep disturbance. The patient is not nervous/anxious.      Current Outpatient Medications on File Prior to Visit  Medication Sig Dispense Refill  . aspirin EC 81 MG tablet Take  81 mg by mouth daily.    . Cholecalciferol (VITAMIN D3) 10000 units TABS Take 1 tablet by mouth daily.    . colchicine 0.6 MG tablet Take 2 tabs  upon acute onset and once daily until symptoms resolve. 30 tablet 0  . levETIRAcetam (KEPPRA) 750 MG tablet Take 750 mg by mouth 2 (two) times daily.    . Multiple Vitamin (MULTIVITAMIN WITH MINERALS) TABS tablet Take 1 tablet by mouth See admin instructions. Take 1 tablet by mouth every 2-3 days    . naproxen (NAPROSYN) 500 MG tablet Take 1 tablet (500 mg total) by mouth 2 (two) times daily with a meal. 20 tablet 0  . Protein (NUTRA/PRO VANILLA) PACK Take by mouth.    . triamcinolone ointment (KENALOG) 0.1 % Apply twice daily to involved areas     No current facility-administered medications on file prior to visit.      Past Medical History:  Diagnosis Date  . AK (actinic keratosis)   . Allergy   . Chicken pox   . Compression fracture of L1 lumbar vertebra (HCC)   . Compression fracture of thoracic vertebra (HCC)   . Hypertension   . Lentigines    Follows with DR Culton (derma)  . Seizures ([place])     Past Surgical History:  Procedure Laterality Date  . mitral vavle     Congetinal mitral valve disease.    Allergies  Allergen Reactions  . Other Other (See Comments)    Neosporin Ophthalmic causes eyes to turn red    Family History  Problem Relation Age of Onset  . Heart disease Mother   . Cancer Father   . Heart disease Father   . Aneurysm Paternal Grandfather   . Heart disease Son        congenital mitral valve dz    Social History   Socioeconomic History  . Marital status: Widowed    Spouse name: Not on file  . Number of children: Not on file  . Years of education: Not on file  . Highest education level: Not on file  Occupational History  . Not on file  Social Needs  . Financial resource strain: Not on file  . Food insecurity:    Worry: Not on file    Inability: Not on file  . Transportation needs:    Medical: Not on file    Non-medical: Not on file  Tobacco Use  . Smoking status: Never Smoker  . Smokeless tobacco: Never Used  Substance and Sexual  Activity  . Alcohol use: Yes    Alcohol/week: 1.0 standard drinks    Types: 1 Cans of beer per week    Comment: socially  . Drug use: No  . Sexual activity: Not on file  Lifestyle  . Physical activity:    Days per week: Not on file    Minutes per session: Not on file  . Stress: Not on file  Relationships  . Social connections:    Talks on phone: Not on file    Gets together: Not on file    Attends religious service: Not on file    Active member of club or organization: Not on file    Attends meetings of clubs or organizations: Not on file    Relationship status: Not on file  Other Topics Concern  . Not on file  Social History Narrative  . Not on file     Vitals:   02/02/18 0752  BP: 130/80  Pulse: 88  Resp: 12  Temp: 97.8 F (36.6 C)  SpO2: 98%   Body mass index is 27.57 kg/m.   Wt Readings from Last 3 Encounters:  02/02/18 209 lb (94.8 kg)  11/26/17 206 lb 4 oz (93.6 kg)  11/13/17 205 lb 3.2 oz (93.1 kg)      Physical Exam  Nursing note and vitals reviewed. Constitutional: He is oriented to person, place, and time. He appears well-developed and well-nourished. No distress.  HENT:  Head: Normocephalic and atraumatic.  Right Ear: Tympanic membrane, external ear and ear canal normal.  Left Ear: Tympanic membrane, external ear and ear canal normal.  Mouth/Throat: Oropharynx is clear and moist and mucous membranes are normal.  Eyes: Pupils are equal, round, and reactive to light. Conjunctivae and EOM are normal.  Neck: Normal range of motion. No tracheal deviation present.  Cardiovascular: Normal rate and regular rhythm.  No murmur heard. Pulses:      Dorsalis pedis pulses are 2+ on the right side, and 2+ on the left side.  Respiratory: Effort normal and breath sounds normal. No respiratory distress.  GI: Soft. He exhibits no mass. There is no hepatomegaly. There is no tenderness.  Genitourinary:  Genitourinary Comments: No concerns.  Musculoskeletal: He  exhibits no edema or tenderness.  No signs of synovitis.  Lymphadenopathy:    He has no cervical adenopathy.       Right: No supraclavicular adenopathy present.       Left: No supraclavicular adenopathy present.  Neurological: He is alert and oriented to person, place, and time. He has normal strength. No cranial nerve deficit or sensory deficit. Coordination and gait normal.  Reflex Scores:      Bicep reflexes are 2+ on the right side and 2+ on the left side.      Patellar reflexes are 2+ on the right side and 2+ on the left side. Skin: Skin is warm. No erythema.  Psychiatric: He has a normal mood and affect. Cognition and memory are normal.  Well groomed,good eye contact.     ASSESSMENT AND PLAN:   Mr. Thatcher was seen today for annual exam and AWV.  Orders Placed This Encounter  Procedures  . Pneumococcal polysaccharide vaccine 23-valent greater than or equal to 2yo subcutaneous/IM  . Hemoglobin A1c  . Comprehensive metabolic panel  . PSA(Must document that pt has been informed of limitations of PSA testing.)  . VITAMIN D 25 Hydroxy (Vit-D Deficiency, Fractures)   Lab Results  Component Value Date   PSA 2.15 02/02/2018   Lab Results  Component Value Date   ALT 18 02/02/2018   AST 19 02/02/2018   ALKPHOS 46 02/02/2018   BILITOT 0.6 02/02/2018   Lab Results  Component Value Date   CREATININE 1.02 02/02/2018   BUN 21 02/02/2018   NA 137 02/02/2018   K 4.3 02/02/2018   CL 100 02/02/2018   CO2 28 02/02/2018   Lab Results  Component Value Date   HGBA1C 5.4 02/02/2018     Routine general medical examination at a health care facility We discussed the importance of regular physical activity and healthy diet for prevention of chronic illness and/or complications. Preventive guidelines reviewed.  Next CPE in a year.  Medicare annual wellness visit, subsequent We discussed the importance of staying active, physically and mentally, as well as the benefits of a  healthy/balance diet. Low impact exercise that involve stretching and strengthing are ideal. Vaccines up to date. We discussed preventive screening for the next 5-10 years,  summery of recommendations given in AVS:   Colonoscopy at 72 years old. Continue periodic eye exam and glaucoma screening. Fall prevention.  Advance directives and end of life discussed, he has both.    Localized osteoporosis without current pathological fracture No changes in current management. Planning on completing 5-6 years, 2022-2023. Last DEXA 12/2015. Fall prevention discussed.  -     ibandronate (BONIVA) 150 MG tablet; TAKE 1 TAB BY MOUTH EVERY 30 DAYS IN THE MORNING W/FULL GLASS OF H2O *DONT TAKE/LIE DOWN X30 MINUTES  Benign essential hypertension Adequately controlled. No changes in current management. Low salt diet to continue. F/U in 12 months, before if needed.  -     Comprehensive metabolic panel -     amLODipine (NORVASC) 5 MG tablet; Take 1 tablet (5 mg total) by mouth daily.  Vitamin D deficiency, unspecified No changes in current management, will follow labs done today and will give further recommendations accordingly.  -     VITAMIN D 25 Hydroxy (Vit-D Deficiency, Fractures)  High blood sugar -     Hemoglobin A1c  BPH associated with nocturia -     PSA(Must document that pt has been informed of limitations of PSA testing.)  Need for vaccination against Streptococcus pneumoniae -     Pneumococcal polysaccharide vaccine 23-valent greater than or equal to 2yo subcutaneous/IM     Return in 1 year (on 02/03/2019) for CPE, medicare.    Daniyah Fohl G. [place], MD  [facility]. [place] office.

## 2018-02-02 NOTE — Patient Instructions (Addendum)
A few things to remember from today's visit:   Routine general medical examination at a health care facility  Localized osteoporosis without current pathological fracture - Plan: ibandronate (BONIVA) 150 MG tablet  Benign essential hypertension - Plan: Comprehensive metabolic panel, amLODipine (NORVASC) 5 MG tablet  Vitamin D deficiency, unspecified - Plan: VITAMIN D 25 Hydroxy (Vit-D Deficiency, Fractures)  High blood sugar - Plan: Hemoglobin A1c  BPH associated with nocturia - Plan: PSA(Must document that pt has been informed of limitations of PSA testing.)   A few tips:  -As we age balance is not as good as it was, so there is a higher risks for falls. Please remove small rugs and furniture that is "in your way" and could increase the risk of falls. Stretching exercises may help with fall prevention: Yoga and Tai Chi are some examples. Low impact exercise is better, so you are not very achy the next day.  -Sun screen and avoidance of direct sun light recommended. Caution with dehydration, if working outdoors be sure to drink enough fluids.  - Some medications are not safe as we age, increases the risk of side effects and can potentially interact with other medication you are also taken;  including some of over the counter medications. Be sure to let me know when you start a new medication even if it is a dietary/vitamin supplement.   -Healthy diet low in red meet/animal fat and sugar + regular physical activity is recommended.       Screening schedule for the next 5-10 years:  Colonoscopy until 73.  Glaucoma screening/eye exam every year.  Flu vaccine annually.  Diabetes and cholesterol screening every 3 years.  Fall prevention

## 2018-03-27 ENCOUNTER — Encounter: Payer: Self-pay | Admitting: Internal Medicine

## 2018-03-27 ENCOUNTER — Ambulatory Visit (INDEPENDENT_AMBULATORY_CARE_PROVIDER_SITE_OTHER): Payer: Medicare Other | Admitting: Internal Medicine

## 2018-03-27 VITALS — BP 140/90 | HR 99 | Temp 97.9°F | Wt 208.8 lb

## 2018-03-27 DIAGNOSIS — R197 Diarrhea, unspecified: Secondary | ICD-10-CM | POA: Insufficient documentation

## 2018-03-27 DIAGNOSIS — K529 Noninfective gastroenteritis and colitis, unspecified: Secondary | ICD-10-CM

## 2018-03-27 NOTE — Patient Instructions (Addendum)
Great meeting you today.  Instructions: -Advoid gluten to see if that seems to effect your bowel habits. -Try eating activia yogurt or OTC probiotics on a daily basis.   -Take Imodium OTC as directed for severe diarrhea; more than 6 stools daily. -Try BRAT diet. -Increase water intake to advoid getting dehydrated. -Follow up in 2 weeks if symptoms do not improve  Bland Diet A bland diet consists of foods that are often soft and do not have a lot of fat, fiber, or extra seasonings. Foods without fat, fiber, or seasoning are easier for the body to digest. They are also less likely to irritate your mouth, throat, stomach, and other parts of your digestive system. A bland diet is sometimes called a BRAT diet. What is my plan? Your health care provider or food and nutrition specialist (dietitian) may recommend specific changes to your diet to prevent symptoms or to treat your symptoms. These changes may include:  Eating small meals often.  Cooking food until it is soft enough to chew easily.  Chewing your food well.  Drinking fluids slowly.  Not eating foods that are very spicy, sour, or fatty.  Not eating citrus fruits, such as oranges and grapefruit. What do I need to know about this diet?  Eat a variety of foods from the bland diet food list.  Do not follow a bland diet longer than needed.  Ask your health care provider whether you should take vitamins or supplements. What foods can I eat? Grains  Hot cereals, such as cream of wheat. Rice. Bread, crackers, or tortillas made from refined white flour. Vegetables Canned or cooked vegetables. Mashed or boiled potatoes. Fruits  Bananas. Applesauce. Other types of cooked or canned fruit with the skin and seeds removed, such as canned peaches or pears. Meats and other proteins  Scrambled eggs. Creamy peanut butter or other nut butters. Lean, well-cooked meats, such as chicken or fish. Tofu. Soups or broths. Dairy Low-fat dairy  products, such as milk, cottage cheese, or yogurt. Beverages  Water. Herbal tea. Apple juice. Fats and oils Mild salad dressings. Canola or olive oil. Sweets and desserts Pudding. Custard. Fruit gelatin. Ice cream. The items listed above may not be a complete list of recommended foods and beverages. Contact a dietitian for more options. What foods are not recommended? Grains Whole grain breads and cereals. Vegetables Raw vegetables. Fruits Raw fruits, especially citrus, berries, or dried fruits. Dairy Whole fat dairy foods. Beverages Caffeinated drinks. Alcohol. Seasonings and condiments Strongly flavored seasonings or condiments. Hot sauce. Salsa. Other foods Spicy foods. Fried foods. Sour foods, such as pickled or fermented foods. Foods with high sugar content. Foods high in fiber. The items listed above may not be a complete list of foods and beverages to avoid. Contact a dietitian for more information. Summary  A bland diet consists of foods that are often soft and do not have a lot of fat, fiber, or extra seasonings.  Foods without fat, fiber, or seasoning are easier for the body to digest.  Check with your health care provider to see how long you should follow this diet plan. It is not meant to be followed for long periods. This information is not intended to replace advice given to you by your health care provider. Make sure you discuss any questions you have with your health care provider. Document Released: 06/12/2015 Document Revised: 03/19/2017 Document Reviewed: 03/19/2017 Elsevier Interactive Patient Education  2019 [employer].

## 2018-03-27 NOTE — Progress Notes (Signed)
Established Patient Office Visit     CC/Reason for Visit: diarrhea  HPI: Samuel Mayo is a 73 y.o. male who is coming in today for the above mentioned reason.  Patient sts hes been having diarrhea x3 weeks.  He denies vomting, fever or blood in his stool.  He sts it seemed to be slowing down some until he ate [place] food for dinner a few nights ago, and it seemed to flare things up again.  He is concerned her may be allergic to gluten.     Past Medical/Surgical History: Past Medical History:  Diagnosis Date  . AK (actinic keratosis)   . Allergy   . Chicken pox   . Compression fracture of L1 lumbar vertebra (HCC)   . Compression fracture of thoracic vertebra (HCC)   . Hypertension   . Lentigines    Follows with DR Culton (derma)  . Seizures ([place])     Past Surgical History:  Procedure Laterality Date  . mitral vavle     Congetinal mitral valve disease.    Social History:  reports that he has never smoked. He has never used smokeless tobacco. He reports current alcohol use of about 1.0 standard drinks of alcohol per week. He reports that he does not use drugs.  Allergies: Allergies  Allergen Reactions  . Other Other (See Comments)    Neosporin Ophthalmic causes eyes to turn red    Family History:  Family History  Problem Relation Age of Onset  . Heart disease Mother   . Cancer Father   . Heart disease Father   . Aneurysm Paternal Grandfather   . Heart disease Son        congenital mitral valve dz     Current Outpatient Medications:  .  amLODipine (NORVASC) 5 MG tablet, Take 1 tablet (5 mg total) by mouth daily., Disp: 90 tablet, Rfl: 3 .  aspirin EC 81 MG tablet, Take 81 mg by mouth daily., Disp: , Rfl:  .  Cholecalciferol (VITAMIN D3) 10000 units TABS, Take 1 tablet by mouth daily., Disp: , Rfl:  .  colchicine 0.6 MG tablet, Take 2 tabs upon acute onset and once daily until symptoms resolve., Disp: 30 tablet, Rfl: 0 .  ibandronate (BONIVA) 150 MG tablet,  TAKE 1 TAB BY MOUTH EVERY 30 DAYS IN THE MORNING W/FULL GLASS OF H2O *DONT TAKE/LIE DOWN X30 MINUTES, Disp: 3 tablet, Rfl: 3 .  levETIRAcetam (KEPPRA) 750 MG tablet, Take 750 mg by mouth 2 (two) times daily., Disp: , Rfl:  .  Multiple Vitamin (MULTIVITAMIN WITH MINERALS) TABS tablet, Take 1 tablet by mouth See admin instructions. Take 1 tablet by mouth every 2-3 days, Disp: , Rfl:  .  naproxen (NAPROSYN) 500 MG tablet, Take 1 tablet (500 mg total) by mouth 2 (two) times daily with a meal., Disp: 20 tablet, Rfl: 0 .  Protein (NUTRA/PRO VANILLA) PACK, Take by mouth., Disp: , Rfl:  .  triamcinolone ointment (KENALOG) 0.1 %, Apply twice daily to involved areas, Disp: , Rfl:   Review of Systems: Constitutional: Denies fever, chills, diaphoresis, appetite change and fatigue.    Respiratory: Denies SOB, DOE, cough, chest tightness,  and wheezing.   Cardiovascular: Denies chest pain, palpitations and leg swelling.  Gastrointestinal: Denies nausea, vomiting, abdominal pain, constipation, blood in stool and abdominal distention. C/o diarrhea x3 weeks Genitourinary: Denies dysuria, urgency, frequency, hematuria, flank pain and difficulty urinating.    Physical Exam: Vitals:   03/27/18 0802  BP: 140/90  Pulse: 99  Temp: 97.9 F (36.6 C)  TempSrc: Oral  SpO2: 98%  Weight: 208 lb 12.8 oz (94.7 kg)    Body mass index is 27.55 kg/m.   Constitutional: NAD, calm, comfortable Eyes: PERRL, lids and conjunctivae normal ENMT: Mucous membranes are moist. Posterior pharynx clear of any exudate or lesions.  Respiratory: clear to auscultation bilaterally, no wheezing, no crackles. Normal respiratory effort. No accessory muscle use.  Cardiovascular: Regular rate and rhythm, no murmurs / rubs / gallops. No extremity edema. 2+ pedal pulses.  Abdomen: no tenderness, no masses palpated. Bowel sounds positive.     Impression and Plan:  Gastroenteritis -add probiotics to your daily intake -take Imodium  OTC as directed for severe diarrhea; more than 6 stools a day -avoid gluten to see if your symptoms quickly resolve -intake plenty of water during this process so you do not get dehydrated -try a BRAT diet   Patient Instructions  Great meeting you today.  Instructions: -Advoid gluten to see if that seems to effect your bowel habits. -Try eating activia yogurt or OTC probiotics on a daily basis.   -Take Imodium OTC as directed for severe diarrhea; more than 6 stools daily. -Try BRAT diet. Bland Diet A bland diet consists of foods that are often soft and do not have a lot of fat, fiber, or extra seasonings. Foods without fat, fiber, or seasoning are easier for the body to digest. They are also less likely to irritate your mouth, throat, stomach, and other parts of your digestive system. A bland diet is sometimes called a BRAT diet. What is my plan? Your health care provider or food and nutrition specialist (dietitian) may recommend specific changes to your diet to prevent symptoms or to treat your symptoms. These changes may include:  Eating small meals often.  Cooking food until it is soft enough to chew easily.  Chewing your food well.  Drinking fluids slowly.  Not eating foods that are very spicy, sour, or fatty.  Not eating citrus fruits, such as oranges and grapefruit. What do I need to know about this diet?  Eat a variety of foods from the bland diet food list.  Do not follow a bland diet longer than needed.  Ask your health care provider whether you should take vitamins or supplements. What foods can I eat? Grains  Hot cereals, such as cream of wheat. Rice. Bread, crackers, or tortillas made from refined white flour. Vegetables Canned or cooked vegetables. Mashed or boiled potatoes. Fruits  Bananas. Applesauce. Other types of cooked or canned fruit with the skin and seeds removed, such as canned peaches or pears. Meats and other proteins  Scrambled eggs. Creamy  peanut butter or other nut butters. Lean, well-cooked meats, such as chicken or fish. Tofu. Soups or broths. Dairy Low-fat dairy products, such as milk, cottage cheese, or yogurt. Beverages  Water. Herbal tea. Apple juice. Fats and oils Mild salad dressings. Canola or olive oil. Sweets and desserts Pudding. Custard. Fruit gelatin. Ice cream. The items listed above may not be a complete list of recommended foods and beverages. Contact a dietitian for more options. What foods are not recommended? Grains Whole grain breads and cereals. Vegetables Raw vegetables. Fruits Raw fruits, especially citrus, berries, or dried fruits. Dairy Whole fat dairy foods. Beverages Caffeinated drinks. Alcohol. Seasonings and condiments Strongly flavored seasonings or condiments. Hot sauce. Salsa. Other foods Spicy foods. Fried foods. Sour foods, such as pickled or fermented foods. Foods with high sugar content. Foods  high in fiber. The items listed above may not be a complete list of foods and beverages to avoid. Contact a dietitian for more information. Summary  A bland diet consists of foods that are often soft and do not have a lot of fat, fiber, or extra seasonings.  Foods without fat, fiber, or seasoning are easier for the body to digest.  Check with your health care provider to see how long you should follow this diet plan. It is not meant to be followed for long periods. This information is not intended to replace advice given to you by your health care provider. Make sure you discuss any questions you have with your health care provider. Document Released: 06/12/2015 Document Revised: 03/19/2017 Document Reviewed: 03/19/2017 Elsevier Interactive Patient Education  2019 [place], RN DNP student [facility] [employer]

## 2018-08-13 DIAGNOSIS — L570 Actinic keratosis: Secondary | ICD-10-CM | POA: Diagnosis not present

## 2018-08-24 DIAGNOSIS — Z8249 Family history of ischemic heart disease and other diseases of the circulatory system: Secondary | ICD-10-CM | POA: Diagnosis not present

## 2018-09-14 DIAGNOSIS — L570 Actinic keratosis: Secondary | ICD-10-CM | POA: Diagnosis not present

## 2018-09-18 DIAGNOSIS — R569 Unspecified convulsions: Secondary | ICD-10-CM | POA: Diagnosis not present

## 2018-10-28 ENCOUNTER — Other Ambulatory Visit: Payer: Self-pay | Admitting: Family Medicine

## 2018-10-28 DIAGNOSIS — M816 Localized osteoporosis [Lequesne]: Secondary | ICD-10-CM

## 2018-11-24 DIAGNOSIS — R569 Unspecified convulsions: Secondary | ICD-10-CM | POA: Diagnosis not present

## 2018-11-24 DIAGNOSIS — Z8249 Family history of ischemic heart disease and other diseases of the circulatory system: Secondary | ICD-10-CM | POA: Diagnosis not present

## 2019-01-07 DIAGNOSIS — H5213 Myopia, bilateral: Secondary | ICD-10-CM | POA: Diagnosis not present

## 2019-01-07 DIAGNOSIS — H2513 Age-related nuclear cataract, bilateral: Secondary | ICD-10-CM | POA: Diagnosis not present

## 2019-01-07 DIAGNOSIS — H524 Presbyopia: Secondary | ICD-10-CM | POA: Diagnosis not present

## 2019-01-07 DIAGNOSIS — H43813 Vitreous degeneration, bilateral: Secondary | ICD-10-CM | POA: Diagnosis not present

## 2019-01-07 DIAGNOSIS — H31103 Choroidal degeneration, unspecified, bilateral: Secondary | ICD-10-CM | POA: Diagnosis not present

## 2019-02-13 ENCOUNTER — Other Ambulatory Visit: Payer: Self-pay | Admitting: Family Medicine

## 2019-02-13 DIAGNOSIS — I10 Essential (primary) hypertension: Secondary | ICD-10-CM

## 2019-02-15 ENCOUNTER — Other Ambulatory Visit: Payer: Self-pay

## 2019-02-16 ENCOUNTER — Ambulatory Visit (INDEPENDENT_AMBULATORY_CARE_PROVIDER_SITE_OTHER): Payer: Medicare Other | Admitting: Family Medicine

## 2019-02-16 ENCOUNTER — Encounter: Payer: Self-pay | Admitting: Family Medicine

## 2019-02-16 VITALS — BP 130/80 | HR 93 | Temp 95.4°F | Ht 73.0 in | Wt 198.0 lb

## 2019-02-16 DIAGNOSIS — N401 Enlarged prostate with lower urinary tract symptoms: Secondary | ICD-10-CM

## 2019-02-16 DIAGNOSIS — M816 Localized osteoporosis [Lequesne]: Secondary | ICD-10-CM

## 2019-02-16 DIAGNOSIS — Z0000 Encounter for general adult medical examination without abnormal findings: Secondary | ICD-10-CM

## 2019-02-16 DIAGNOSIS — R351 Nocturia: Secondary | ICD-10-CM | POA: Diagnosis not present

## 2019-02-16 DIAGNOSIS — E559 Vitamin D deficiency, unspecified: Secondary | ICD-10-CM

## 2019-02-16 DIAGNOSIS — I10 Essential (primary) hypertension: Secondary | ICD-10-CM

## 2019-02-16 DIAGNOSIS — G40909 Epilepsy, unspecified, not intractable, without status epilepticus: Secondary | ICD-10-CM

## 2019-02-16 LAB — COMPREHENSIVE METABOLIC PANEL
ALT: 17 U/L (ref 0–53)
AST: 20 U/L (ref 0–37)
Albumin: 4.3 g/dL (ref 3.5–5.2)
Alkaline Phosphatase: 51 U/L (ref 39–117)
BUN: 20 mg/dL (ref 6–23)
CO2: 28 mEq/L (ref 19–32)
Calcium: 9 mg/dL (ref 8.4–10.5)
Chloride: 97 mEq/L (ref 96–112)
Creatinine, Ser: 1.01 mg/dL (ref 0.40–1.50)
GFR: 72.02 mL/min (ref >60.00)
Glucose, Bld: 95 mg/dL (ref 70–99)
Potassium: 3.7 mEq/L (ref 3.5–5.1)
Sodium: 133 mEq/L — ABNORMAL LOW (ref 135–145)
Total Bilirubin: 0.7 mg/dL (ref 0.2–1.2)
Total Protein: 6.5 g/dL (ref 6.0–8.3)

## 2019-02-16 LAB — LIPID PANEL
Cholesterol: 163 mg/dL (ref 0–200)
HDL: 79.1 mg/dL (ref >39.00)
LDL Cholesterol: 74 mg/dL (ref 0–99)
NonHDL: 83.77
Total CHOL/HDL Ratio: 2
Triglycerides: 48 mg/dL (ref 0.0–149.0)
VLDL: 9.6 mg/dL (ref 0.0–40.0)

## 2019-02-16 LAB — VITAMIN D 25 HYDROXY (VIT D DEFICIENCY, FRACTURES): VITD: 54.37 ng/mL (ref 30.00–100.00)

## 2019-02-16 LAB — PSA: PSA: 2.21 ng/mL (ref 0.10–4.00)

## 2019-02-16 NOTE — Patient Instructions (Signed)
A few things to remember from today's visit:   Benign essential hypertension - Plan: CMP, Lipid panel  Vitamin D deficiency, unspecified - Plan: VITAMIN D 25 Hydroxy (Vit-D Deficiency, Fractures)  Localized osteoporosis without current pathological fracture  Routine general medical examination at a health care facility  BPH associated with nocturia - Plan: PSA   At least 150 minutes of moderate exercise per week, daily brisk walking for 15-30 min is a good exercise option. Healthy diet low in saturated (animal) fats and sweets and consisting of fresh fruits and vegetables, lean meats such as fish and white chicken and whole grains.  - Vaccines:  Tdap vaccine every 10 years.  Shingles vaccine recommended at age 57, could be given after 75 years of age but not sure about insurance coverage.  Pneumonia vaccines:  Pneumovax at 19.   -Screening recommendations for low/normal risk males:  Screening for diabetes at age 53 and every 3 years. Earlier screening if cardiovascular risk factors.   Lipid screening at 35 and every 3 years. Screening starts in younger males with cardiovascular risk factors.  Colon cancer screening at age 62 and until age 80.  Prostate cancer screening: some controversy, starts usually at 98: Rectal exam and PSA.  Aortic Abdominal Aneurism once between 74 and 9 years old if ever smoker.  Also recommended:  1. Dental visit- Brush and floss your teeth twice daily; visit your dentist twice a year. 2. Eye doctor- Get an eye exam at least every 2 years. 3. Helmet use- Always wear a helmet when riding a bicycle, motorcycle, rollerblading or skateboarding. 4. Safe sex- If you may be exposed to sexually transmitted infections, use a condom. 5. Seat belts- Seat belts can save your live; always wear one. 6. Smoke/Carbon Monoxide detectors- These detectors need to be installed on the appropriate level of your home. Replace batteries at least once a year. 7. Skin  cancer- When out in the sun please cover up and use sunscreen 15 SPF or higher. 8. Violence- If anyone is threatening or hurting you, please tell your healthcare provider.  9. Drink alcohol in moderation- Limit alcohol intake to one drink or less per day. Never drink and drive.  Please be sure medication list is accurate. If a new problem present, please set up appointment sooner than planned today.

## 2019-02-16 NOTE — Progress Notes (Signed)
HPI:  [name] is a 73 y.o.male here today for his routine physical examination.  Last CPE: 02/02/18 He lives with his male companion.  Regular exercise 3 or more times per week: He walks daily 3-4 miles per day.  Following a healthy diet: Yes, homemade meals.  Chronic medical problems: HTN,epilepsy,osteoporosis,gout,and vit D deficiency among some. Follows with neurologist, Dr Samuel Mayo.  He is on Boniva 150 mg monthly and has taken it for about 2 years.  Immunization History  Administered Date(s) Administered  . Influenza, High Dose Seasonal PF 12/18/2016, 11/10/2017, 10/30/2018  . Influenza-Unspecified 12/18/2016  . Pneumococcal Conjugate-13 01/31/2017  . Pneumococcal Polysaccharide-23 02/02/2018  . Tdap 10/05/2013   -Hep C screening: 11/10/14 NR  Last colon cancer screening: 07/2009. Last prostate ca screening: PSA 2.1 in 02/2018. Nocturia x 2-3 if he drinks fluids.  -Denies high alcohol intake, tobacco use, or Hx of illicit drug use. Drinking more wine, 2-3 glasses.  -Concerns and/or follow up today:   Gout: Last gout attack a while ago.  HTN: He is on Amlodipine 5 mg daily. BP's at home have been mildly elevated  BP readings: 140's/90's most, some 120's-130's/80's  Review of Systems  Constitutional: Negative for activity change, appetite change, fatigue and fever.  HENT: Negative for dental problem, nosebleeds, sore throat and trouble swallowing.   Eyes: Negative for redness and visual disturbance.  Respiratory: Negative for cough, shortness of breath and wheezing.   Cardiovascular: Negative for chest pain, palpitations and leg swelling.  Gastrointestinal: Negative for abdominal pain, blood in stool, nausea and vomiting.  Endocrine: Negative for cold intolerance, heat intolerance, polydipsia, polyphagia and polyuria.  Genitourinary: Negative for decreased urine volume, discharge, dysuria, genital sores, hematuria and testicular pain.  Musculoskeletal:  Negative for gait problem and myalgias.  Skin: Negative for color change and rash.  Allergic/Immunologic: Positive for environmental allergies.  Neurological: Negative for syncope, weakness and headaches.  Hematological: Negative for adenopathy. Does not bruise/bleed easily.  Psychiatric/Behavioral: Negative for confusion. The patient is not nervous/anxious.   All other systems reviewed and are negative.  Current Outpatient Medications on File Prior to Visit  Medication Sig Dispense Refill  . amLODipine (NORVASC) 5 MG tablet TAKE 1 TABLET BY MOUTH EVERY DAY 90 tablet 3  . aspirin EC 81 MG tablet Take 81 mg by mouth daily.    . Cholecalciferol (VITAMIN D3) 10000 units TABS Take 1 tablet by mouth daily.    . colchicine 0.6 MG tablet Take 2 tabs upon acute onset and once daily until symptoms resolve. 30 tablet 0  . ibandronate (BONIVA) 150 MG tablet TAKE 1 TABLET BY MOUTH EVERY 30 DAYS. TAKE IN AM WITH FULL GLASS OF WATER ON EMPTY STOMACH AND DON'T TAKE ANYTHING ELSE BY MOUTH OR LIE DOWN FOR THE NEXT 30 MINUTES 3 tablet 5  . levETIRAcetam (KEPPRA) 750 MG tablet Take 750 mg by mouth 2 (two) times daily.    . Multiple Vitamin (MULTIVITAMIN WITH MINERALS) TABS tablet Take 1 tablet by mouth See admin instructions. Take 1 tablet by mouth every 2-3 days    . Protein (NUTRA/PRO VANILLA) PACK Take by mouth.    . triamcinolone ointment (KENALOG) 0.1 % Apply twice daily to involved areas     No current facility-administered medications on file prior to visit.   Past Medical History:  Diagnosis Date  . AK (actinic keratosis)   . Allergy   . Chicken pox   . Compression fracture of L1 lumbar vertebra (HCC)   . Compression  fracture of thoracic vertebra ([place])   . Hypertension   . Lentigines    Follows with DR Samuel Mayo (derma)  . Seizures ([place])    Past Surgical History:  Procedure Laterality Date  . mitral vavle     Congetinal mitral valve disease.   Allergies  Allergen Reactions  . Other Other  (See Comments)    Neosporin Ophthalmic causes eyes to turn red    Family History  Problem Relation Age of Onset  . Heart disease Mother   . Cancer Father   . Heart disease Father   . Aneurysm Paternal Grandfather   . Heart disease Son        congenital mitral valve dz   Social History   Socioeconomic History  . Marital status: Widowed    Spouse name: Not on file  . Number of children: Not on file  . Years of education: Not on file  . Highest education level: Not on file  Occupational History  . Not on file  Tobacco Use  . Smoking status: Never Smoker  . Smokeless tobacco: Never Used  Substance and Sexual Activity  . Alcohol use: Yes    Alcohol/week: 1.0 standard drinks    Types: 1 Cans of beer per week    Comment: socially  . Drug use: No  . Sexual activity: Not on file  Other Topics Concern  . Not on file  Social History Narrative  . Not on file   Social Determinants of Health   Financial Resource Strain:   . Difficulty of Paying Living Expenses: Not on file  Food Insecurity:   . Worried About Charity fundraiser in the Last Year: Not on file  . Ran Out of Food in the Last Year: Not on file  Transportation Needs:   . Lack of Transportation (Medical): Not on file  . Lack of Transportation (Non-Medical): Not on file  Physical Activity:   . Days of Exercise per Week: Not on file  . Minutes of Exercise per Session: Not on file  Stress:   . Feeling of Stress : Not on file  Social Connections:   . Frequency of Communication with Friends and Family: Not on file  . Frequency of Social Gatherings with Friends and Family: Not on file  . Attends Religious Services: Not on file  . Active Member of Clubs or Organizations: Not on file  . Attends Archivist Meetings: Not on file  . Marital Status: Not on file    Today's Vitals   02/16/19 0958 02/16/19 1036  BP: 120/78 130/80  Pulse: 93   Temp: (!) 95.4 F (35.2 C)   TempSrc: Tympanic   SpO2: 99%    Weight: 198 lb (89.8 kg)   Height: 6\' 1"  (1.854 m)    Body mass index is 26.12 kg/m.  Wt Readings from Last 3 Encounters:  02/16/19 198 lb (89.8 kg)  03/27/18 208 lb 12.8 oz (94.7 kg)  02/02/18 209 lb (94.8 kg)    Physical Exam  Nursing note and vitals reviewed. Constitutional: He is oriented to person, place, and time. He appears well-developed and well-nourished. No distress.  HENT:  Head: Normocephalic and atraumatic.  Right Ear: Tympanic membrane, external ear and ear canal normal.  Left Ear: Tympanic membrane, external ear and ear canal normal.  Mouth/Throat: Oropharynx is clear and moist and mucous membranes are normal.  Eyes: Pupils are equal, round, and reactive to light. Conjunctivae and EOM are normal.  Neck: Normal range of  motion. No tracheal deviation present. No thyromegaly present.  Cardiovascular: Normal rate and regular rhythm.  No murmur heard. Pulses:      Dorsalis pedis pulses are 2+ on the right side and 2+ on the left side.  Respiratory: Effort normal and breath sounds normal. No respiratory distress.  GI: Soft. He exhibits no mass. There is no hepatomegaly. There is no abdominal tenderness.  Genitourinary:    Genitourinary Comments: No concerns.   Musculoskeletal:        General: No tenderness or edema.     Comments: No major deformities appreciated and no signs of synovitis.  Lymphadenopathy:    He has no cervical adenopathy.       Right: No supraclavicular adenopathy present.       Left: No supraclavicular adenopathy present.  Neurological: He is alert and oriented to person, place, and time. He has normal strength. No cranial nerve deficit or sensory deficit. Coordination and gait normal.  Reflex Scores:      Bicep reflexes are 2+ on the right side and 2+ on the left side.      Patellar reflexes are 2+ on the right side and 2+ on the left side. Skin: Skin is warm. No erythema.  Psychiatric: He has a normal mood and affect. Cognition and memory are  normal.  Well groomed,good eye contact.   ASSESSMENT AND PLAN:  [name] was here today annual physical examination.  Orders Placed This Encounter  Procedures  . CMP  . Lipid panel  . PSA  . VITAMIN D 25 Hydroxy (Vit-D Deficiency, Fractures)   Lab Results  Component Value Date   PSA 2.21 02/16/2019   Lab Results  Component Value Date   CHOL 163 02/16/2019   HDL 79.10 02/16/2019   LDLCALC 74 02/16/2019   TRIG 48.0 02/16/2019   CHOLHDL 2 02/16/2019   Lab Results  Component Value Date   CREATININE 1.01 02/16/2019   BUN 20 02/16/2019   NA 133 (L) 02/16/2019   K 3.7 02/16/2019   CL 97 02/16/2019   CO2 28 02/16/2019   Lab Results  Component Value Date   ALT 17 02/16/2019   AST 20 02/16/2019   ALKPHOS 51 02/16/2019   BILITOT 0.7 02/16/2019   Routine general medical examination at a health care facility We discussed the importance of regular physical activity and healthy diet for prevention of chronic illness and/or complications. Preventive guidelines reviewed. Vaccination up to date.  Next CPE in a year.  Benign essential hypertension BP adequately controlled. We need to be sure BP monitor is accurate,bring BP monitor next OV. Continue low salt diet.   -     CMP -     Lipid panel  Vitamin D deficiency, unspecified No changes in current management, will follow labs done today and will give further recommendations accordingly.  -     VITAMIN D 25 Hydroxy (Vit-D Deficiency, Fractures)  Localized osteoporosis without current pathological fracture Fall precautions. Complete 5 years of Boniva.  BPH associated with nocturia Not interested in pharmacologic treatment. Try to decrease fluid intake around 3-4 hours before bedtime.  -     PSA  Seizure disorder (HCC) Well controlled with Keppra 750 mg bid. Following with neurologist.  Return in 1 year (on 02/16/2020) for Medicare visit needed. CPE and f/u in a year..    Brodyn Depuy G. [place],  MD  [facility]. [place] office.

## 2019-02-18 ENCOUNTER — Encounter: Payer: Self-pay | Admitting: Family Medicine

## 2019-03-18 ENCOUNTER — Ambulatory Visit: Payer: Medicare Other

## 2019-04-02 ENCOUNTER — Ambulatory Visit: Payer: Medicare Other

## 2019-04-11 ENCOUNTER — Ambulatory Visit: Payer: Medicare Other | Attending: Internal Medicine

## 2019-04-11 DIAGNOSIS — Z23 Encounter for immunization: Secondary | ICD-10-CM | POA: Insufficient documentation

## 2019-04-11 NOTE — Progress Notes (Signed)
   Covid-19 Vaccination Clinic  Name:  Samuel Mayo    MRN: [id number] DOB: [birth year]  04/11/2019  [name] was observed post Covid-19 immunization for 15 minutes without incidence. He was provided with Vaccine Information Sheet and instruction to access the V-Safe system.   [name] was instructed to call 911 with any severe reactions post vaccine: Marland Kitchen Difficulty breathing  . Swelling of your face and throat  . A fast heartbeat  . A bad rash all over your body  . Dizziness and weakness    Immunizations Administered    Name Date Dose VIS Date Route   Pfizer COVID-19 Vaccine 04/11/2019  5:09 PM 0.3 mL 02/12/2019 Intramuscular   Manufacturer: [place]   Lot: [id number]   [place]: [id number]

## 2019-04-20 DIAGNOSIS — R569 Unspecified convulsions: Secondary | ICD-10-CM | POA: Diagnosis not present

## 2019-04-29 ENCOUNTER — Ambulatory Visit: Payer: Medicare Other

## 2019-05-06 ENCOUNTER — Ambulatory Visit: Payer: Medicare Other | Attending: Internal Medicine

## 2019-05-06 DIAGNOSIS — Z23 Encounter for immunization: Secondary | ICD-10-CM

## 2019-05-06 NOTE — Progress Notes (Signed)
   Covid-19 Vaccination Clinic  Name:  Samuel Mayo    MRN: [id number] DOB: 01/06/1946  05/06/2019  [name] was observed post Covid-19 immunization for 15 minutes without incident. He was provided with Vaccine Information Sheet and instruction to access the V-Safe system.   [name] was instructed to call 911 with any severe reactions post vaccine: Marland Kitchen Difficulty breathing  . Swelling of face and throat  . A fast heartbeat  . A bad rash all over body  . Dizziness and weakness   Immunizations Administered    Name Date Dose VIS Date Route   Pfizer COVID-19 Vaccine 05/06/2019  9:59 AM 0.3 mL 02/12/2019 Intramuscular   Manufacturer: [place]   Lot: [id number]   [place]: [id number]

## 2019-05-20 DIAGNOSIS — R569 Unspecified convulsions: Secondary | ICD-10-CM | POA: Diagnosis not present

## 2019-06-08 DIAGNOSIS — R569 Unspecified convulsions: Secondary | ICD-10-CM | POA: Diagnosis not present

## 2019-06-15 DIAGNOSIS — D485 Neoplasm of uncertain behavior of skin: Secondary | ICD-10-CM | POA: Diagnosis not present

## 2019-06-15 DIAGNOSIS — L570 Actinic keratosis: Secondary | ICD-10-CM | POA: Diagnosis not present

## 2019-06-15 DIAGNOSIS — L821 Other seborrheic keratosis: Secondary | ICD-10-CM | POA: Diagnosis not present

## 2019-06-15 DIAGNOSIS — D2262 Melanocytic nevi of left upper limb, including shoulder: Secondary | ICD-10-CM | POA: Diagnosis not present

## 2019-06-25 DIAGNOSIS — R569 Unspecified convulsions: Secondary | ICD-10-CM | POA: Diagnosis not present

## 2019-06-26 DIAGNOSIS — R569 Unspecified convulsions: Secondary | ICD-10-CM | POA: Diagnosis not present

## 2019-06-27 DIAGNOSIS — R569 Unspecified convulsions: Secondary | ICD-10-CM | POA: Diagnosis not present

## 2019-07-09 DIAGNOSIS — R569 Unspecified convulsions: Secondary | ICD-10-CM | POA: Diagnosis not present

## 2019-07-09 DIAGNOSIS — Z87898 Personal history of other specified conditions: Secondary | ICD-10-CM | POA: Diagnosis not present

## 2019-07-22 ENCOUNTER — Telehealth: Payer: Self-pay | Admitting: Family Medicine

## 2019-07-22 DIAGNOSIS — Z1211 Encounter for screening for malignant neoplasm of colon: Secondary | ICD-10-CM

## 2019-07-22 NOTE — Telephone Encounter (Signed)
Pt keeps getting calls from our office regarding it's time to schedule a coloscopy. Pt would like to schedule an appt. Pt is aware that you are out of the office today and will return on 07/23/19. Thanks

## 2019-07-22 NOTE — Telephone Encounter (Signed)
Ok to put order in for GI?

## 2019-07-23 NOTE — Telephone Encounter (Signed)
If he is due for one, referral can be placed. Thanks, BJ

## 2019-07-26 NOTE — Addendum Note (Signed)
Addended by: Gwenyth Ober R on: 07/26/2019 09:04 AM   Modules accepted: Orders

## 2019-07-26 NOTE — Telephone Encounter (Signed)
Patient notified of update  and verbalized understanding. 

## 2019-08-25 ENCOUNTER — Encounter: Payer: Self-pay | Admitting: Gastroenterology

## 2019-08-25 ENCOUNTER — Telehealth: Payer: Self-pay | Admitting: Family Medicine

## 2019-08-25 NOTE — Telephone Encounter (Signed)
Pt is calling to check the status of his GI referral pt was given the number to [facility] [phone] so that he can check the status of the referral.  (Information was given by Neoma Laming to give to the pt)

## 2019-09-10 ENCOUNTER — Telehealth: Payer: Self-pay

## 2019-09-10 NOTE — Telephone Encounter (Signed)
Patient returned call to the office- patient reports he has not had a seizure in the last 3 years and has been seen by neurology recently; patient reports he is stable on his medication for this-Keppra- Patient aware of upcoming pre visit appt for procedure

## 2019-09-10 NOTE — Telephone Encounter (Signed)
Left message for patient to call back to the office regarding medication and seizure hx;

## 2019-09-23 ENCOUNTER — Other Ambulatory Visit: Payer: Self-pay

## 2019-09-23 ENCOUNTER — Ambulatory Visit (AMBULATORY_SURGERY_CENTER): Payer: Self-pay | Admitting: *Deleted

## 2019-09-23 VITALS — Ht 73.0 in | Wt 195.0 lb

## 2019-09-23 DIAGNOSIS — Z1211 Encounter for screening for malignant neoplasm of colon: Secondary | ICD-10-CM

## 2019-09-23 NOTE — Progress Notes (Signed)
No egg or soy allergy known to patient  No issues with past sedation with any surgeries or procedures no intubation problems in the past  No diet pills per patient No home 02 use per patient  No blood thinners per patient  Pt denies issues with constipation  No A fib or A flutter  EMMI video to pt or MyChart  COVID 19 guidelines implemented in [facility] today   Coupon given to pt in [facility] today   Due to the COVID-19 pandemic we are asking patients to follow these guidelines. Please only bring one care partner. Please be aware that your care partner may wait in the car in the parking lot or if they feel like they will be too hot to wait in the car, they may wait in the lobby on the 4th floor. All care partners are required to wear a mask the entire time (we do not have any that we can provide them), they need to practice social distancing, and we will do a Covid check for all patient's and care partners when you arrive. Also we will check their temperature and your temperature. If the care partner waits in their car they need to stay in the parking lot the entire time and we will call them on their cell phone when the patient is ready for discharge so they can bring the car to the front of the building. Also all patient's will need to wear a mask into building.

## 2019-09-24 ENCOUNTER — Encounter: Payer: Self-pay | Admitting: Gastroenterology

## 2019-10-07 ENCOUNTER — Encounter (HOSPITAL_COMMUNITY): Payer: Self-pay | Admitting: *Deleted

## 2019-10-07 ENCOUNTER — Encounter: Payer: Self-pay | Admitting: Gastroenterology

## 2019-10-07 ENCOUNTER — Other Ambulatory Visit: Payer: Self-pay

## 2019-10-07 ENCOUNTER — Ambulatory Visit: Payer: Medicare Other | Admitting: Gastroenterology

## 2019-10-07 ENCOUNTER — Emergency Department (HOSPITAL_COMMUNITY)
Admission: EM | Admit: 2019-10-07 | Discharge: 2019-10-07 | Disposition: A | Discharge status: Home / Self Care | Payer: Medicare Other | Attending: Emergency Medicine | Admitting: Emergency Medicine

## 2019-10-07 VITALS — BP 157/104 | HR 82 | Temp 97.1°F | Ht 73.0 in | Wt 195.0 lb

## 2019-10-07 DIAGNOSIS — Z7982 Long term (current) use of aspirin: Secondary | ICD-10-CM | POA: Insufficient documentation

## 2019-10-07 DIAGNOSIS — R569 Unspecified convulsions: Secondary | ICD-10-CM | POA: Insufficient documentation

## 2019-10-07 DIAGNOSIS — R000 Tachycardia, unspecified: Secondary | ICD-10-CM | POA: Diagnosis not present

## 2019-10-07 DIAGNOSIS — Z743 Need for continuous supervision: Secondary | ICD-10-CM | POA: Diagnosis not present

## 2019-10-07 DIAGNOSIS — R6889 Other general symptoms and signs: Secondary | ICD-10-CM | POA: Diagnosis not present

## 2019-10-07 DIAGNOSIS — I10 Essential (primary) hypertension: Secondary | ICD-10-CM | POA: Insufficient documentation

## 2019-10-07 DIAGNOSIS — I499 Cardiac arrhythmia, unspecified: Secondary | ICD-10-CM | POA: Diagnosis not present

## 2019-10-07 DIAGNOSIS — Z79899 Other long term (current) drug therapy: Secondary | ICD-10-CM | POA: Diagnosis not present

## 2019-10-07 DIAGNOSIS — Z1211 Encounter for screening for malignant neoplasm of colon: Secondary | ICD-10-CM

## 2019-10-07 LAB — BASIC METABOLIC PANEL
Anion gap: 10 (ref 5–15)
BUN: 14 mg/dL (ref 8–23)
CO2: 22 mmol/L (ref 22–32)
Calcium: 8.4 mg/dL — ABNORMAL LOW (ref 8.9–10.3)
Chloride: 101 mmol/L (ref 98–111)
Creatinine, Ser: 1.03 mg/dL (ref 0.61–1.24)
GFR calc Af Amer: >60 mL/min (ref >60)
GFR calc non Af Amer: >60 mL/min (ref >60)
Glucose, Bld: 120 mg/dL — ABNORMAL HIGH (ref 70–99)
Potassium: 3.9 mmol/L (ref 3.5–5.1)
Sodium: 133 mmol/L — ABNORMAL LOW (ref 135–145)

## 2019-10-07 LAB — CBC WITH DIFFERENTIAL/PLATELET
Abs Immature Granulocytes: 0.02 10*3/uL (ref 0.00–0.07)
Basophils Absolute: 0 10*3/uL (ref 0.0–0.1)
Basophils Relative: 0 %
Eosinophils Absolute: 0 10*3/uL (ref 0.0–0.5)
Eosinophils Relative: 0 %
HCT: 44.8 % (ref 39.0–52.0)
Hemoglobin: 15.3 g/dL (ref 13.0–17.0)
Immature Granulocytes: 0 %
Lymphocytes Relative: 6 %
Lymphs Abs: 0.6 10*3/uL — ABNORMAL LOW (ref 0.7–4.0)
MCH: 32.2 pg (ref 26.0–34.0)
MCHC: 34.2 g/dL (ref 30.0–36.0)
MCV: 94.3 fL (ref 80.0–100.0)
Monocytes Absolute: 0.8 10*3/uL (ref 0.1–1.0)
Monocytes Relative: 8 %
Neutro Abs: 8 10*3/uL — ABNORMAL HIGH (ref 1.7–7.7)
Neutrophils Relative %: 86 %
Platelets: 205 10*3/uL (ref 150–400)
RBC: 4.75 MIL/uL (ref 4.22–5.81)
RDW: 12.4 % (ref 11.5–15.5)
WBC: 9.4 10*3/uL (ref 4.0–10.5)
nRBC: 0 % (ref 0.0–0.2)

## 2019-10-07 LAB — CBG MONITORING, ED: Glucose-Capillary: 129 mg/dL — ABNORMAL HIGH (ref 70–99)

## 2019-10-07 MED ORDER — LEVETIRACETAM IN NACL 1000 MG/100ML IV SOLN
1000.0000 mg | Freq: Once | INTRAVENOUS | Status: AC
  Administered 2019-10-07: 1000 mg via INTRAVENOUS
  Filled 2019-10-07: qty 100

## 2019-10-07 MED ORDER — SODIUM CHLORIDE 0.9 % IV SOLN: 500.0000 mL | Freq: Once | INTRAVENOUS | Status: DC

## 2019-10-07 NOTE — Progress Notes (Deleted)
PT taken to PACU. Monitors in place. VSS. Report given to RN. 

## 2019-10-07 NOTE — Progress Notes (Signed)
10:30-patient experienced a witnessed seizure while in the admitting area of [place] which lasted approximately 30 seconds long.  Nursing staff, MD, and CRNA present.  10:38-VS= HR 119, SpO2=97% on 2L O2 via [place]  10:40-manual BP 140/96 on Left arm while lying in bed.    10:42-Patient's first non verbal response occurred  1043:  Patient verbally responds, is confused  10:46-BP 155/97  10:49-VS HR 108, SpO2: 97% on 2L O2 via [place], BP 159/103  10:51-Patient verbally responds A&O to self and DOB  10:53-VS HR 110, SpO2: 97% on 2L via [place], BP 164/90  11:14-EMS present and transporting [facility] for further evaluation

## 2019-10-07 NOTE — Discharge Instructions (Addendum)
1.  Resume your Keppra this evening per usual. 2.  Take precautions for any recurrence of seizure, no driving, no activities that result in injury if you were to have a repeat seizure, stay with someone to observe you for the next 24 hours. 3.  Follow-up with your doctor regarding any planning to reschedule for your screening colonoscopy.  Follow-up with your neurologist to discuss your breakthrough seizure and medications. 4.  Return to the emergency department if you have any concerning or new symptoms.

## 2019-10-07 NOTE — Progress Notes (Addendum)
Vital signs checked by:CW  The patient states no changes in medical or surgical history since pre-visit screening on 09/23/19.   Addendum: Called to patient bedside in the preoperative unit for witnessed seizure-like activity.  By report, patient yelled out, then was noted to be tensing upper extremities with blank stare.  Patient not verbally responsive during episode.  When I arrived at patient bedside, was breathing on his own but not verbally responsive.  Was still tensing upper extremities.  Checked manual BP which was 140s/90s.  Good pulse ox and supplemental O2 applied.  Lungs clear to auscultation, regular rate and rhythm.  Very small abrasion on lower lip.  Symptoms started to abate without medical intervention.  Seizure activity was in the preoperative unit.  No preoperative medications were given and no procedure attempted/performed.  Patient has a history of seizures, which had been well controlled with Keppra.  No recent changes in dose, but not exactly clear if he took his medications today/yesterday.  I discussed today's events with his care partner, and she describes a very similar presentation on his index seizure 3 years ago.  Had been seizure-free recently.  After updating his care partner, I performed repeat evaluation.  Patient awake and eyes open.  Oriented to person and birthday, but does not know current location, date, situation.  Is not able to identify his care partner by name.  Vital signs stable and breathing unlabored.  Certainly seems consistent with seizure.  Will transport patient to [facility], ER via EMS for further evaluation.  Today's procedure  canceled.  I discussed this plan with his care partner who is in full agreement.  Gerrit Heck, DO, [facility]

## 2019-10-07 NOTE — ED Provider Notes (Signed)
[facility] EMERGENCY DEPARTMENT Provider Note   CSN: [id number] Arrival date & time: 10/07/19  1147     History Chief Complaint  Patient presents with  . Seizures    Samuel Mayo is a 74 y.o. male.  HPI Patient has known history of seizure disorder.  He is routinely on Keppra.  Patient has had 2 days of prep for a scheduled, routine colonoscopy.  He reports that his understanding was he was not supposed to take his medications.  If he has not had Keppra for 2 days.  He reports aside from the discomfort and inconvenience of doing the bowel prep, he has been feeling well.  Patient is not having any problems with chest pain, shortness of breath or abdominal pain.  Patient has seizure activity well in the preop area for his endoscopy.  Reportedly he yelled out and then had a episode of tensing the upper extremities and blank stare.  During episode, patient was not verbally responsive.  The symptoms spontaneously resolved without administration of medications.    Past Medical History:  Diagnosis Date  . AK (actinic keratosis)   . Allergy   . Chicken pox   . Compression fracture of L1 lumbar vertebra (HCC)   . Compression fracture of thoracic vertebra (HCC)   . Hypertension   . Lentigines    Follows with DR Culton (derma)  . Seizures [facility])     Patient Active Problem List   Diagnosis Date Noted  . Diarrhea in adult patient 03/27/2018  . Gout, arthritis- R MTP joint. 11/26/2017  . Vitamin D deficiency, unspecified 01/30/2017  . Osteoporosis 10/23/2016  . Benign essential hypertension 10/23/2016  . Complex partial epilepsy ([place]) 09/21/2015  . Status epilepticus ([place]) 09/16/2015    Past Surgical History:  Procedure Laterality Date  . COLONOSCOPY    . mitral vavle     Congetinal mitral valve disease.       Family History  Problem Relation Age of Onset  . Heart disease Mother   . Cancer Father   . Heart disease Father   . Aneurysm Paternal Grandfather    . Heart disease Son        congenital mitral valve dz  . Colon cancer Neg Hx   . Esophageal cancer Neg Hx   . Stomach cancer Neg Hx   . Rectal cancer Neg Hx     Social History   Tobacco Use  . Smoking status: Never Smoker  . Smokeless tobacco: Never Used  Substance Use Topics  . Alcohol use: Yes    Alcohol/week: 1.0 standard drink    Types: 1 Cans of beer per week    Comment: socially  . Drug use: No    Home Medications Prior to Admission medications   Medication Sig Start Date End Date Taking? Authorizing Provider  amLODipine (NORVASC) 5 MG tablet TAKE 1 TABLET BY MOUTH EVERY DAY 02/15/19   Martinique, [name], MD  aspirin EC 81 MG tablet Take 81 mg by mouth daily.    [provider]  Cholecalciferol (VITAMIN D3) 10000 units TABS Take 1 tablet by mouth daily.    [provider]  ibandronate (BONIVA) 150 MG tablet TAKE 1 TABLET BY MOUTH EVERY 30 DAYS. TAKE IN AM WITH FULL GLASS OF WATER ON EMPTY STOMACH AND DON'T TAKE ANYTHING ELSE BY MOUTH OR LIE DOWN FOR THE NEXT 30 MINUTES 10/28/18   Martinique, [name], MD  levETIRAcetam (KEPPRA) 750 MG tablet Take 750 mg by mouth  2 (two) times daily.    [provider]  Multiple Vitamin (MULTIVITAMIN WITH MINERALS) TABS tablet Take 1 tablet by mouth See admin instructions. Take 1 tablet by mouth every 2-3 days    [provider]  Protein (NUTRA/PRO VANILLA) PACK Take by mouth.    [provider]    Allergies    Other  Review of Systems   Review of Systems 10 systems reviewed and negative except as per HPI Physical Exam Updated Vital Signs BP (!) 153/106 (BP Location: Right Arm)   Pulse 95   Temp 98.6 F (37 C) (Oral)   Resp 16   Ht 6' (1.829 m)   Wt 88.5 kg   SpO2 100%   BMI 26.45 kg/m   Physical Exam Constitutional:      Appearance: He is well-developed.  HENT:     Head: Normocephalic and atraumatic.     Mouth/Throat:     Mouth: Mucous membranes are moist.     Pharynx: Oropharynx  is clear.  Eyes:     Extraocular Movements: Extraocular movements intact.     Pupils: Pupils are equal, round, and reactive to light.  Cardiovascular:     Rate and Rhythm: Normal rate and regular rhythm.     Heart sounds: Normal heart sounds.  Pulmonary:     Effort: Pulmonary effort is normal.     Breath sounds: Normal breath sounds.  Abdominal:     General: Bowel sounds are normal. There is no distension.     Palpations: Abdomen is soft.     Tenderness: There is no abdominal tenderness.  Musculoskeletal:        General: Normal range of motion.     Cervical back: Neck supple.  Skin:    General: Skin is warm and dry.  Neurological:     General: No focal deficit present.     Mental Status: He is alert and oriented to person, place, and time.     GCS: GCS eye subscore is 4. GCS verbal subscore is 5. GCS motor subscore is 6.     Cranial Nerves: No cranial nerve deficit.     Motor: No weakness.     Coordination: Coordination normal.     ED Results / Procedures / Treatments   Labs (all labs ordered are listed, but only abnormal results are displayed) Labs Reviewed  CBG MONITORING, ED - Abnormal; Notable for the following components:      Result Value   Glucose-Capillary 129 (*)    All other components within normal limits  BASIC METABOLIC PANEL  CBC WITH DIFFERENTIAL/PLATELET    EKG EKG Interpretation  Date/Time:  Thursday October 07 2019 12:02:19 EDT Ventricular Rate:  101 PR Interval:    QRS Duration: 114 QT Interval:  374 QTC Calculation: 485 R Axis:   -52 Text Interpretation: Sinus tachycardia Multiple ventricular premature complexes LAD, consider left anterior fascicular block no acute ischemic appearance, n sig change from old Confirmed by Charlesetta Shanks [phone]) on 10/07/2019 1:33:12 PM   Radiology No results found.  Procedures Procedures (including critical care time)  Medications Ordered in ED Medications  levETIRAcetam (KEPPRA) IVPB 1000 mg/100 mL premix  (1,000 mg Intravenous New Bag/Given 10/07/19 1337)    ED Course  I have reviewed the triage vital signs and the nursing notes.  Pertinent labs & imaging results that were available during my care of the patient were reviewed by me and considered in my medical decision making (see chart for details).  MDM Rules/Calculators/A&P                          Patient has self-limited seizure activity in the preop holding area for a scheduled colonoscopy.  Patient had held his Gaffney for 2 days for bowel prep reasons.  At this time, patient is back to normal without any residual symptoms.  Findings consistent with breakthrough seizure after missed doses of medications.  Due to patient being 2 days behind on Keppra, will load with 1000 mg load in the emergency department.  Anticipate discharge home with resuming Keppra evening dosing. Final Clinical Impression(s) / ED Diagnoses Final diagnoses:  Seizure [facility])    Rx / Juda Orders ED Discharge Orders    None       Charlesetta Shanks, MD 10/07/19 1342

## 2019-10-07 NOTE — ED Triage Notes (Signed)
Patient presents to ed via [facility] from GI office states he was being prepped for colonoscopy  And patient had a seizure lasting approx. 30 sec. Patient was posticle upon ems arrival , states patient stopped his Keppra on tues per orders of MD. Last seizure was 3 years ago. Denies any oral trauma. Upon arrival to ed  Patient is alert oriented.

## 2019-10-11 ENCOUNTER — Telehealth: Payer: Self-pay | Admitting: Family Medicine

## 2019-10-11 NOTE — Telephone Encounter (Signed)
Pt had a seizure while waiting for the colonoscopy to begin. They canceled the procedure. Pt had to hold his Keppra for 2 days prior to the procedure.

## 2019-10-11 NOTE — Telephone Encounter (Signed)
Pt is calling to get an opinion of what he needs to do after he has had his colonoscopy which was done last week.  Pt would like to have a call back.

## 2019-10-13 NOTE — Telephone Encounter (Signed)
For now I recommend resuming Keppra 750 mg twice daily and arrange appointment with his neurologist. Avoid driving until he sees neurologist. We will discuss other options for colon cancer screening during next visit. Thanks, BJ

## 2019-10-13 NOTE — Telephone Encounter (Signed)
Pt called back to see if Dr. Martinique could give some kind of information.  Pt is aware that the previous msg is on the providers desk and they ask for 3-5 business days for a reply.

## 2019-10-13 NOTE — Telephone Encounter (Signed)
I called and spoke with pt. We went over the information below & he verbalized understanding. He will contact his neurologist.

## 2019-11-30 ENCOUNTER — Encounter: Payer: Self-pay | Admitting: Gastroenterology

## 2019-12-06 ENCOUNTER — Telehealth: Payer: Self-pay | Admitting: *Deleted

## 2019-12-06 NOTE — Telephone Encounter (Signed)
dR cIRIGLIANO,  THIS PT IS SCHEDULED FOR A DIRECT SCREENING COLON WITH YOU 12-29-2019. HE WAS LAST SEEN 10-07-2019, IN THE [facility] ADMITTING- HE HAD A SEIZURE IN ADMITTING AWAITING HIS COLON- HE WAS SENT TO THE ED- HE HAD HELD HIS KEPPRA 2 DAYS ACCORDING TO HIS ED NOTE- NO [facility] GI HX- NO PREVIOUS COLON. HE IS ON KEPPRA 2 X A DAY AND NO SEIZURES PRIOR FOR MAYBE 2-3 YRS UNTIL THE EP[IOSODE 10-07-2019.  CAN HE PROCEED ON KEPPRA OR DO YOU WANT HIM TO HAVE AN OV?  PLEASE ADVISE, THANKS, MARIE PV

## 2019-12-08 NOTE — Telephone Encounter (Signed)
CAlled pt- voice mail- LM to return my call- marie pv

## 2019-12-08 NOTE — Telephone Encounter (Signed)
I recommend that he follows up with his Neurologist for preoperative assessment/clearance to ensure that he is okay to proceed.  Can schedule OV with me prior to colonoscopy but after seen by his Neurologist.  Thank you.

## 2019-12-08 NOTE — Telephone Encounter (Signed)
I spoke with Samuel Mayo and explained recommendations per Dr Bryan Lemma- Pt will call [facility] and schedule an OV ,  He will have Neurologist document if he has clearance for colon in the Office note, after he sees neuro he will call and scheule OV with Dr Bryan Lemma- pt aware I canceled the 10-12 V and the 10-27 Colon-  Explained after he sees Dr Bryan Lemma in the office, his colon will get RS if he gets the neurology clearance - pt verbalized understanding of all these instructions

## 2019-12-10 DIAGNOSIS — L538 Other specified erythematous conditions: Secondary | ICD-10-CM | POA: Diagnosis not present

## 2019-12-10 DIAGNOSIS — S41119A Laceration without foreign body of unspecified upper arm, initial encounter: Secondary | ICD-10-CM | POA: Diagnosis not present

## 2019-12-10 DIAGNOSIS — S61219A Laceration without foreign body of unspecified finger without damage to nail, initial encounter: Secondary | ICD-10-CM | POA: Diagnosis not present

## 2019-12-10 DIAGNOSIS — Z23 Encounter for immunization: Secondary | ICD-10-CM | POA: Diagnosis not present

## 2019-12-16 DIAGNOSIS — R569 Unspecified convulsions: Secondary | ICD-10-CM | POA: Diagnosis not present

## 2019-12-16 DIAGNOSIS — Z87898 Personal history of other specified conditions: Secondary | ICD-10-CM | POA: Diagnosis not present

## 2019-12-18 ENCOUNTER — Ambulatory Visit: Payer: Medicare Other | Attending: Internal Medicine

## 2019-12-18 DIAGNOSIS — Z23 Encounter for immunization: Secondary | ICD-10-CM

## 2019-12-18 NOTE — Progress Notes (Signed)
   Covid-19 Vaccination Clinic  Name:  Samuel Mayo    MRN: [id number] DOB: 1945/09/16  12/18/2019  Samuel Mayo was observed post Covid-19 immunization for 15 minutes without incident. He was provided with Vaccine Information Sheet and instruction to access the V-Safe system.   Samuel Mayo was instructed to call 911 with any severe reactions post vaccine: Marland Kitchen Difficulty breathing  . Swelling of face and throat  . A fast heartbeat  . A bad rash all over body  . Dizziness and weakness

## 2019-12-22 DIAGNOSIS — S41119A Laceration without foreign body of unspecified upper arm, initial encounter: Secondary | ICD-10-CM | POA: Diagnosis not present

## 2019-12-22 DIAGNOSIS — L539 Erythematous condition, unspecified: Secondary | ICD-10-CM | POA: Diagnosis not present

## 2019-12-29 ENCOUNTER — Encounter: Payer: Medicare Other | Admitting: Gastroenterology

## 2020-01-26 ENCOUNTER — Other Ambulatory Visit: Payer: Self-pay | Admitting: Family Medicine

## 2020-01-26 DIAGNOSIS — I10 Essential (primary) hypertension: Secondary | ICD-10-CM

## 2020-01-29 ENCOUNTER — Other Ambulatory Visit: Payer: Self-pay | Admitting: Family Medicine

## 2020-01-29 DIAGNOSIS — M816 Localized osteoporosis [Lequesne]: Secondary | ICD-10-CM

## 2020-02-01 DIAGNOSIS — R350 Frequency of micturition: Secondary | ICD-10-CM | POA: Diagnosis not present

## 2020-02-01 DIAGNOSIS — R569 Unspecified convulsions: Secondary | ICD-10-CM | POA: Diagnosis not present

## 2020-02-01 DIAGNOSIS — Z79899 Other long term (current) drug therapy: Secondary | ICD-10-CM | POA: Diagnosis not present

## 2020-02-01 DIAGNOSIS — E559 Vitamin D deficiency, unspecified: Secondary | ICD-10-CM | POA: Diagnosis not present

## 2020-02-18 ENCOUNTER — Other Ambulatory Visit: Payer: Self-pay

## 2020-02-18 ENCOUNTER — Encounter: Payer: Self-pay | Admitting: Family Medicine

## 2020-02-18 ENCOUNTER — Ambulatory Visit (INDEPENDENT_AMBULATORY_CARE_PROVIDER_SITE_OTHER): Payer: Medicare Other | Admitting: Family Medicine

## 2020-02-18 VITALS — BP 132/80 | HR 84 | Temp 98.2°F | Resp 16 | Ht 72.0 in | Wt 205.0 lb

## 2020-02-18 DIAGNOSIS — R7309 Other abnormal glucose: Secondary | ICD-10-CM

## 2020-02-18 DIAGNOSIS — I10 Essential (primary) hypertension: Secondary | ICD-10-CM | POA: Diagnosis not present

## 2020-02-18 DIAGNOSIS — G40909 Epilepsy, unspecified, not intractable, without status epilepticus: Secondary | ICD-10-CM

## 2020-02-18 DIAGNOSIS — Z0000 Encounter for general adult medical examination without abnormal findings: Secondary | ICD-10-CM

## 2020-02-18 LAB — POCT GLYCOSYLATED HEMOGLOBIN (HGB A1C): Hemoglobin A1C: 5.2 % (ref 4.0–5.6)

## 2020-02-18 MED ORDER — AMLODIPINE BESYLATE 5 MG PO TABS: 5.0000 mg | ORAL_TABLET | Freq: Every day | ORAL | 3 refills | Status: DC

## 2020-02-18 NOTE — Progress Notes (Signed)
HPI:  Samuel Mayo is a 74 y.o.male here today for his routine physical examination.  Last CPE: 02/16/19. Since his last time he has followed with his neurologist more often, medications have been adjusted.  He lives with his wife.  Regular exercise 3 or more times per week: Almost 10,000 steps daily, walks his dog in the mornings. He is also active with yard work. Following a healthy diet: Hs wife cooks, he loves it but he thinks he has generous portions. Also his wife loves to cook cakes from scratch a few times per month.  Chronic medical problems: Seizure disorder, hypertension, osteoporosis, OSA, vitamin D deficiency, and vitamin B12 deficiency among some. Chriropractor and massages regularly for apparent lower back pain, still helping. B12 deficiency: He started a multivitamin with B12 1 to 2 weeks ago. 02/01/20: B12 295.  Since his last visit he has been evaluated in the ER due to episode of seizure, 10/22/2019.  Event happened same day he was supposed to have a colonoscopy, he held [place] during prep. He has been following with a neurologist more frequently, treatment is being adjusted. According the patient, the plan is to wean off Keppra. Divalproex 250 mg bid has been added.  Gabapentin 300 mg at bedtime to help with RLS, medication is also helping with his sleep. Currently he is not driving.  Immunization History  Administered Date(s) Administered  . Influenza, High Dose Seasonal PF 12/18/2016, 11/10/2017, 10/30/2018  . Influenza-Unspecified 12/18/2016  . PFIZER SARS-COV-2 Vaccination 04/11/2019, 05/06/2019, 12/18/2019  . Pneumococcal Conjugate-13 01/31/2017  . Pneumococcal Polysaccharide-23 02/02/2018  . Tdap 10/05/2013   -Hep C screening: 11/10/2014 NR. Last colon cancer screening: 07/02/2009. Last prostate ca screening: PSA 2.47 in 01/2020. Nocturia x 3. Pending urology evaluation.  Negative for high alcohol intake or tobacco use.  -Concerns and/or follow  up today:   Glucose mildly elevated during last ER evaluation, 120. No history of diabetes. Negative for abdominal pain, nausea,vomiting, polydipsia,polyuria, or polyphagia.  HTN: Currently he is on amlodipine 5 mg daily. Tolerated medication well.  On 02/01/2020 he had blood work done. CBC, CMP, valproic acid level, TSH, B12, 25 OH vitamin D, and PSA.   Comprehensive Metabolic Panel (CMP) Specimen:  Blood  Ref Range & Units 2 wk ago  Glucose 70 - 110 mg/dL 86   Sodium 136 - 145 mmol/L 137   Potassium 3.6 - 5.1 mmol/L 4.7   Chloride 97 - 109 mmol/L 99   Carbon Dioxide (CO2) 22.0 - 32.0 mmol/L 30.6   Urea Nitrogen (BUN) 7 - 25 mg/dL 24   Creatinine 0.7 - 1.3 mg/dL 1.1   Glomerular Filtration Rate (eGFR), MDRD Estimate >60 mL/min/1.73sq m 65   Calcium 8.7 - 10.3 mg/dL 9.4   AST  8 - 39 U/L 24   ALT  6 - 57 U/L 21   Alk Phos (alkaline Phosphatase) 34 - 104 U/L 69   Albumin 3.5 - 4.8 g/dL 4.5   Bilirubin, Total 0.3 - 1.2 mg/dL 0.6   Protein, Total 6.1 - 7.9 g/dL 6.8   A/G Ratio 1.0 - 5.0 gm/dL 2.0   Resulting Agency      Review of Systems  Constitutional: Negative for activity change, appetite change, fatigue and fever.  HENT: Negative for mouth sores, nosebleeds and sore throat.   Eyes: Negative for redness and visual disturbance.  Respiratory: Negative for cough, shortness of breath and wheezing.   Cardiovascular: Negative for chest pain, palpitations and leg swelling.  Gastrointestinal: Negative for  blood in stool.       No changes in bowel habits.  Endocrine: Negative for cold intolerance and heat intolerance.  Genitourinary: Negative for decreased urine volume, dysuria and hematuria.  Musculoskeletal: Positive for back pain. Negative for gait problem.  Allergic/Immunologic: Positive for environmental allergies.  Neurological: Negative for syncope, weakness and headaches.  Psychiatric/Behavioral: Negative for behavioral problems and confusion.  All other systems  reviewed and are negative.  Current Outpatient Medications on File Prior to Visit  Medication Sig Dispense Refill  . Cholecalciferol (VITAMIN D3) 10000 units TABS Take 1 tablet by mouth daily.    Marland Kitchen ibandronate (BONIVA) 150 MG tablet TAKE 1 TABLET BY MOUTH EVERY 30 DAYS. TAKE IN AM WITH FULL GLASS OF WATER ON EMPTY STOMACH AND DON'T TAKE ANYTHING ELSE BY MOUTH OR LIE DOWN FOR THE NEXT 30 MINUTES 3 tablet 5  . levETIRAcetam (KEPPRA) 750 MG tablet Take 750 mg by mouth at bedtime.    . Multiple Vitamin (MULTIVITAMIN WITH MINERALS) TABS tablet Take 1 tablet by mouth See admin instructions. Take 1 tablet by mouth every 2-3 days    . Protein (NUTRA/PRO VANILLA) PACK Take by mouth.     Current Facility-Administered Medications on File Prior to Visit  Medication Dose Route Frequency Provider Last Rate Last Admin  . 0.9 %  sodium chloride infusion  500 mL Intravenous Once Cirigliano, Dominic Pea, DO        Past Medical History:  Diagnosis Date  . AK (actinic keratosis)   . Allergy   . Chicken pox   . Compression fracture of L1 lumbar vertebra (HCC)   . Compression fracture of thoracic vertebra (HCC)   . Hypertension   . Lentigines    Follows with DR Culton (derma)  . Seizures ([place])     Past Surgical History:  Procedure Laterality Date  . COLONOSCOPY    . mitral vavle     Congetinal mitral valve disease.    Allergies  Allergen Reactions  . Other Other (See Comments)    Neosporin Ophthalmic causes eyes to turn red    Family History  Problem Relation Age of Onset  . Heart disease Mother   . Cancer Father   . Heart disease Father   . Aneurysm Paternal Grandfather   . Heart disease Son        congenital mitral valve dz  . Colon cancer Neg Hx   . Esophageal cancer Neg Hx   . Stomach cancer Neg Hx   . Rectal cancer Neg Hx     Social History   Socioeconomic History  . Marital status: Widowed    Spouse name: Not on file  . Number of children: Not on file  . Years of education:  Not on file  . Highest education level: Not on file  Occupational History  . Not on file  Tobacco Use  . Smoking status: Never Smoker  . Smokeless tobacco: Never Used  Substance and Sexual Activity  . Alcohol use: Yes    Alcohol/week: 1.0 standard drink    Types: 1 Cans of beer per week    Comment: socially  . Drug use: No  . Sexual activity: Not on file  Other Topics Concern  . Not on file  Social History Narrative  . Not on file   Social Determinants of Health   Financial Resource Strain: Not on file  Food Insecurity: Not on file  Transportation Needs: Not on file  Physical Activity: Not on file  Stress:  Not on file  Social Connections: Not on file   Vitals:   02/18/20 1014  BP: 132/80  Pulse: 84  Resp: 16  Temp: 98.2 F (36.8 C)  SpO2: 97%   Body mass index is 27.8 kg/m.  Wt Readings from Last 3 Encounters:  02/18/20 205 lb (93 kg)  10/07/19 195 lb (88.5 kg)  10/07/19 195 lb (88.5 kg)   Physical Exam Vitals and nursing note reviewed.  Constitutional:      General: He is not in acute distress.    Appearance: He is well-developed and well-groomed.  HENT:     Head: Normocephalic and atraumatic.     Right Ear: Tympanic membrane, ear canal and external ear normal.     Left Ear: Tympanic membrane, ear canal and external ear normal.     Mouth/Throat:     Mouth: Oropharynx is clear and moist and mucous membranes are normal. Mucous membranes are moist.     Pharynx: Oropharynx is clear.  Eyes:     Extraocular Movements: Extraocular movements intact and EOM normal.     Conjunctiva/sclera: Conjunctivae normal.     Pupils: Pupils are equal, round, and reactive to light.  Neck:     Thyroid: No thyromegaly.     Trachea: No tracheal deviation.  Cardiovascular:     Rate and Rhythm: Normal rate and regular rhythm. Occasional extrasystoles are present.    Pulses:          Dorsalis pedis pulses are 2+ on the right side and 2+ on the left side.     Heart sounds: No  murmur heard.   Pulmonary:     Effort: Pulmonary effort is normal. No respiratory distress.     Breath sounds: Normal breath sounds.  Chest:  Breasts:     Right: No supraclavicular adenopathy.     Left: No supraclavicular adenopathy.    Abdominal:     Palpations: Abdomen is soft. There is no hepatomegaly or mass.     Tenderness: There is no abdominal tenderness.  Genitourinary:    Comments: Deferred to urologist. Musculoskeletal:        General: No tenderness or edema.     Cervical back: Normal range of motion.     Comments: No major deformities appreciated and no signs of synovitis.  Lymphadenopathy:     Cervical: No cervical adenopathy.     Upper Body:     Right upper body: No supraclavicular adenopathy.     Left upper body: No supraclavicular adenopathy.  Skin:    General: Skin is warm.     Findings: No erythema.  Neurological:     General: No focal deficit present.     Mental Status: He is alert and oriented to person, place, and time.     Cranial Nerves: No cranial nerve deficit.     Sensory: No sensory deficit.     Coordination: Coordination normal.     Gait: Gait normal.     Deep Tendon Reflexes: Strength normal.     Reflex Scores:      Bicep reflexes are 2+ on the right side and 2+ on the left side.      Patellar reflexes are 2+ on the right side and 2+ on the left side. Psychiatric:        Mood and Affect: Mood and affect normal. Mood is not anxious or depressed.        Cognition and Memory: Cognition and memory normal.    ASSESSMENT AND PLAN:  Samuel Mayo was seen today for annual exam.  Diagnoses and all orders for this visit: Orders Placed This Encounter  Procedures  . POC HgB A1c   Lab Results  Component Value Date   HGBA1C 5.2 02/18/2020   Routine general medical examination at a health care facility We discussed the importance of regular physical activity and healthy diet for prevention of chronic illness and/or complications. Preventive  guidelines reviewed. Vaccination up-to-date. In regard to colon cancer screening, we discussed options.  He still would like to go ahead with colonoscopy, recommend waiting until next year.  He is supposed to meet with GI to discuss options. Next CPE in a year.  Benign essential hypertension BP adequately controlled. Continue amlodipine 5 mg daily. Low-salt diet.  -     amLODipine (NORVASC) 5 MG tablet; Take 1 tablet (5 mg total) by mouth daily.  Elevated glucose level Healthy lifestyle recommended for primary prevention of diabetes. A1c today normal range.  Seizure disorder ([place]) Most likely caused by stopping Keppra. Continue to follow with neurologist.  Return in 1 year (on 02/17/2021) for cpe.   Lula Michaux G. Martinique, MD  [facility]. [place] office.  A few things to remember from today's visit:   Routine general medical examination at a health care facility  Benign essential hypertension  Elevated glucose level  No changes today. I will see you back in a year, before if needed.  If you need refills please call your pharmacy. Do not use My Chart to request refills or for acute issues that need immediate attention.   A few tips:  -As we age balance is not as good as it was, so there is a higher risks for falls. Please remove small rugs and furniture that is "in your way" and could increase the risk of falls. Stretching exercises may help with fall prevention: Yoga and Tai Chi are some examples. Low impact exercise is better, so you are not very achy the next day.  -Sun screen and avoidance of direct sun light recommended. Caution with dehydration, if working outdoors be sure to drink enough fluids.  - Some medications are not safe as we age, increases the risk of side effects and can potentially interact with other medication you are also taken;  including some of over the counter medications. Be sure to let me know when you start a new medication even if it  is a dietary/vitamin supplement.   -Healthy diet low in red meet/animal fat and sugar + regular physical activity is recommended.    Please be sure medication list is accurate. If a new problem present, please set up appointment sooner than planned today.

## 2020-02-18 NOTE — Patient Instructions (Addendum)
A few things to remember from today's visit:   Routine general medical examination at a health care facility  Benign essential hypertension  Elevated glucose level  No changes today. I will see you back in a year, before if needed.  If you need refills please call your pharmacy. Do not use My Chart to request refills or for acute issues that need immediate attention.   A few tips:  -As we age balance is not as good as it was, so there is a higher risks for falls. Please remove small rugs and furniture that is "in your way" and could increase the risk of falls. Stretching exercises may help with fall prevention: Yoga and Tai Chi are some examples. Low impact exercise is better, so you are not very achy the next day.  -Sun screen and avoidance of direct sun light recommended. Caution with dehydration, if working outdoors be sure to drink enough fluids.  - Some medications are not safe as we age, increases the risk of side effects and can potentially interact with other medication you are also taken;  including some of over the counter medications. Be sure to let me know when you start a new medication even if it is a dietary/vitamin supplement.   -Healthy diet low in red meet/animal fat and sugar + regular physical activity is recommended.    Please be sure medication list is accurate. If a new problem present, please set up appointment sooner than planned today.

## 2020-03-06 ENCOUNTER — Ambulatory Visit: Payer: Self-pay | Admitting: Urology

## 2020-03-27 ENCOUNTER — Encounter: Payer: Self-pay | Admitting: Urology

## 2020-03-27 ENCOUNTER — Ambulatory Visit: Payer: Medicare Other | Admitting: Urology

## 2020-03-27 ENCOUNTER — Other Ambulatory Visit: Payer: Self-pay

## 2020-03-27 VITALS — BP 157/92 | HR 105 | Ht 72.0 in | Wt 205.0 lb

## 2020-03-27 DIAGNOSIS — R350 Frequency of micturition: Secondary | ICD-10-CM | POA: Diagnosis not present

## 2020-03-27 DIAGNOSIS — R351 Nocturia: Secondary | ICD-10-CM

## 2020-03-27 NOTE — Progress Notes (Signed)
03/27/2020 2:04 PM   Samuel Mayo 19-Feb-1946 [id number]  Referring provider: Anabel Bene, MD [facility] [place],  [place] [address]  Chief Complaint  Patient presents with  . Urinary Frequency    HPI: I was consulted to assist the patient's frequency worse at night.  She he has restless leg syndrome and I think since this is being treated he now goes about every 2 and half hours and it used to be every 2 hours.  He goes every 2 hours during the day depending on fluid intake.  He likes coffee.  He is having some sleeping issues.  No ankle edema.  Sometimes he has a lot of urgency.  No incontinence.  No history of kidney stones bladder surgery or urinary tract infections     PMH: Past Medical History:  Diagnosis Date  . AK (actinic keratosis)   . Allergy   . Chicken pox   . Compression fracture of L1 lumbar vertebra (HCC)   . Compression fracture of thoracic vertebra (HCC)   . Hypertension   . Lentigines    Follows with DR Culton (derma)  . Seizures [facility])     Surgical History: Past Surgical History:  Procedure Laterality Date  . COLONOSCOPY    . mitral vavle     Congetinal mitral valve disease.    Home Medications:  Allergies as of 03/27/2020      Reactions   Other Other (See Comments)   Neosporin Ophthalmic causes eyes to turn red      Medication List       Accurate as of March 27, 2020  2:04 PM. If you have any questions, ask your nurse or doctor.        amLODipine 5 MG tablet Commonly known as: NORVASC Take 1 tablet (5 mg total) by mouth daily.   ibandronate 150 MG tablet Commonly known as: BONIVA TAKE 1 TABLET BY MOUTH EVERY 30 DAYS. TAKE IN AM WITH FULL GLASS OF WATER ON EMPTY STOMACH AND DON'T TAKE ANYTHING ELSE BY MOUTH OR LIE DOWN FOR THE NEXT 30 MINUTES   levETIRAcetam 750 MG tablet Commonly known as: KEPPRA Take 750 mg by mouth at bedtime.   multivitamin with minerals Tabs tablet Take 1  tablet by mouth See admin instructions. Take 1 tablet by mouth every 2-3 days   Nutra/Pro Vanilla Pack Take by mouth.   Vitamin D3 250 MCG (10000 UT) Tabs Take 1 tablet by mouth daily.       Allergies:  Allergies  Allergen Reactions  . Other Other (See Comments)    Neosporin Ophthalmic causes eyes to turn red    Family History: Family History  Problem Relation Age of Onset  . Heart disease Mother   . Cancer Father   . Heart disease Father   . Aneurysm Paternal Grandfather   . Heart disease Son        congenital mitral valve dz  . Colon cancer Neg Hx   . Esophageal cancer Neg Hx   . Stomach cancer Neg Hx   . Rectal cancer Neg Hx     Social History:  reports that he has never smoked. He has never used smokeless tobacco. He reports current alcohol use of about 1.0 standard drink of alcohol per week. He reports that he does not use drugs.  ROS:  Physical Exam: There were no vitals taken for this visit.  Constitutional:  Alert and oriented, No acute distress. HEENT: [place] AT, moist mucus membranes.  Trachea midline, no masses. Cardiovascular: No clubbing, cyanosis, or edema. Respiratory: Normal respiratory effort, no increased work of breathing. GI: Abdomen is soft, nontender, nondistended, no abdominal masses GU: No CVA tenderness.  50 g or larger prostate benign Skin: No rashes, bruises or suspicious lesions. Lymph: No cervical or inguinal adenopathy. Neurologic: Grossly intact, no focal deficits, moving all 4 extremities. Psychiatric: Normal mood and affect.  Laboratory Data: Lab Results  Component Value Date   WBC 9.4 10/07/2019   HGB 15.3 10/07/2019   HCT 44.8 10/07/2019   MCV 94.3 10/07/2019   PLT 205 10/07/2019    Lab Results  Component Value Date   CREATININE 1.03 10/07/2019    Lab Results  Component Value Date   PSA 2.21 02/16/2019   PSA 2.15 02/02/2018   PSA 1.94 01/31/2017    No  results found for: TESTOSTERONE  Lab Results  Component Value Date   HGBA1C 5.2 02/18/2020    Urinalysis    Component Value Date/Time   COLORURINE YELLOW 01/31/2017 0849   APPEARANCEUR CLEAR 01/31/2017 0849   APPEARANCEUR Clear 02/03/2012 1915   LABSPEC 1.015 01/31/2017 0849   LABSPEC 1.015 02/03/2012 1915   PHURINE 7.0 01/31/2017 0849   GLUCOSEU NEGATIVE 01/31/2017 0849   HGBUR NEGATIVE 01/31/2017 0849   BILIRUBINUR NEGATIVE 01/31/2017 0849   BILIRUBINUR Negative 02/03/2012 1915   KETONESUR TRACE (A) 01/31/2017 0849   PROTEINUR 100 (A) 09/16/2015 1149   UROBILINOGEN 0.2 01/31/2017 0849   NITRITE NEGATIVE 01/31/2017 0849   LEUKOCYTESUR NEGATIVE 01/31/2017 0849   LEUKOCYTESUR Negative 02/03/2012 1915    Pertinent Imaging: Chart reviewed.  Urine reviewed.  Urine sent for culture  Assessment & Plan: Pathophysiology of frequency and nighttime frequency discussed.  Role of medication discussed.  Watchful waiting chosen and I will see him as needed  There are no diagnoses linked to this encounter.  No follow-ups on file.  Reece Packer, MD  [facility] [address], [place], [place] [address] [phone]

## 2020-03-28 LAB — MICROSCOPIC EXAMINATION: Bacteria, UA: NONE SEEN

## 2020-03-28 LAB — URINALYSIS, COMPLETE
Bilirubin, UA: NEGATIVE
Glucose, UA: NEGATIVE
Leukocytes,UA: NEGATIVE
Nitrite, UA: NEGATIVE
RBC, UA: NEGATIVE
Specific Gravity, UA: 1.025 (ref 1.005–1.030)
Urobilinogen, Ur: 0.2 mg/dL (ref 0.2–1.0)
pH, UA: 6 (ref 5.0–7.5)

## 2020-04-05 DIAGNOSIS — R350 Frequency of micturition: Secondary | ICD-10-CM | POA: Diagnosis not present

## 2020-04-05 DIAGNOSIS — R569 Unspecified convulsions: Secondary | ICD-10-CM | POA: Diagnosis not present

## 2020-04-05 DIAGNOSIS — G2581 Restless legs syndrome: Secondary | ICD-10-CM | POA: Diagnosis not present

## 2020-04-14 ENCOUNTER — Encounter (HOSPITAL_COMMUNITY): Payer: Self-pay

## 2020-04-14 ENCOUNTER — Emergency Department (HOSPITAL_COMMUNITY): Payer: Medicare Other

## 2020-04-14 ENCOUNTER — Emergency Department (HOSPITAL_COMMUNITY)
Admission: EM | Admit: 2020-04-14 | Discharge: 2020-04-14 | Disposition: A | Discharge status: Home / Self Care | Payer: Medicare Other | Attending: Emergency Medicine | Admitting: Emergency Medicine

## 2020-04-14 ENCOUNTER — Other Ambulatory Visit: Payer: Self-pay

## 2020-04-14 DIAGNOSIS — Z043 Encounter for examination and observation following other accident: Secondary | ICD-10-CM | POA: Diagnosis not present

## 2020-04-14 DIAGNOSIS — S0990XA Unspecified injury of head, initial encounter: Secondary | ICD-10-CM | POA: Diagnosis present

## 2020-04-14 DIAGNOSIS — Z79899 Other long term (current) drug therapy: Secondary | ICD-10-CM | POA: Insufficient documentation

## 2020-04-14 DIAGNOSIS — Z743 Need for continuous supervision: Secondary | ICD-10-CM | POA: Diagnosis not present

## 2020-04-14 DIAGNOSIS — S0081XA Abrasion of other part of head, initial encounter: Secondary | ICD-10-CM | POA: Insufficient documentation

## 2020-04-14 DIAGNOSIS — R000 Tachycardia, unspecified: Secondary | ICD-10-CM | POA: Diagnosis not present

## 2020-04-14 DIAGNOSIS — R569 Unspecified convulsions: Secondary | ICD-10-CM | POA: Diagnosis not present

## 2020-04-14 DIAGNOSIS — I499 Cardiac arrhythmia, unspecified: Secondary | ICD-10-CM | POA: Diagnosis not present

## 2020-04-14 DIAGNOSIS — R6889 Other general symptoms and signs: Secondary | ICD-10-CM | POA: Diagnosis not present

## 2020-04-14 DIAGNOSIS — W19XXXA Unspecified fall, initial encounter: Secondary | ICD-10-CM | POA: Diagnosis not present

## 2020-04-14 DIAGNOSIS — S00512A Abrasion of oral cavity, initial encounter: Secondary | ICD-10-CM | POA: Diagnosis not present

## 2020-04-14 DIAGNOSIS — S60511A Abrasion of right hand, initial encounter: Secondary | ICD-10-CM | POA: Insufficient documentation

## 2020-04-14 DIAGNOSIS — S60512A Abrasion of left hand, initial encounter: Secondary | ICD-10-CM | POA: Insufficient documentation

## 2020-04-14 DIAGNOSIS — S60519A Abrasion of unspecified hand, initial encounter: Secondary | ICD-10-CM

## 2020-04-14 DIAGNOSIS — I10 Essential (primary) hypertension: Secondary | ICD-10-CM | POA: Diagnosis not present

## 2020-04-14 DIAGNOSIS — R0902 Hypoxemia: Secondary | ICD-10-CM | POA: Diagnosis not present

## 2020-04-14 DIAGNOSIS — S0091XA Abrasion of unspecified part of head, initial encounter: Secondary | ICD-10-CM

## 2020-04-14 DIAGNOSIS — R220 Localized swelling, mass and lump, head: Secondary | ICD-10-CM | POA: Diagnosis not present

## 2020-04-14 DIAGNOSIS — R404 Transient alteration of awareness: Secondary | ICD-10-CM | POA: Diagnosis not present

## 2020-04-14 LAB — CBC
HCT: 42.1 % (ref 39.0–52.0)
Hemoglobin: 14.2 g/dL (ref 13.0–17.0)
MCH: 32.7 pg (ref 26.0–34.0)
MCHC: 33.7 g/dL (ref 30.0–36.0)
MCV: 97 fL (ref 80.0–100.0)
Platelets: 203 10*3/uL (ref 150–400)
RBC: 4.34 MIL/uL (ref 4.22–5.81)
RDW: 13.1 % (ref 11.5–15.5)
WBC: 10.5 10*3/uL (ref 4.0–10.5)
nRBC: 0 % (ref 0.0–0.2)

## 2020-04-14 LAB — COMPREHENSIVE METABOLIC PANEL
ALT: 38 U/L (ref 0–44)
AST: 37 U/L (ref 15–41)
Albumin: 3.6 g/dL (ref 3.5–5.0)
Alkaline Phosphatase: 46 U/L (ref 38–126)
Anion gap: 10 (ref 5–15)
BUN: 25 mg/dL — ABNORMAL HIGH (ref 8–23)
CO2: 24 mmol/L (ref 22–32)
Calcium: 8.8 mg/dL — ABNORMAL LOW (ref 8.9–10.3)
Chloride: 103 mmol/L (ref 98–111)
Creatinine, Ser: 1.23 mg/dL (ref 0.61–1.24)
GFR, Estimated: >60 mL/min (ref >60)
Glucose, Bld: 143 mg/dL — ABNORMAL HIGH (ref 70–99)
Potassium: 4.1 mmol/L (ref 3.5–5.1)
Sodium: 137 mmol/L (ref 135–145)
Total Bilirubin: 0.7 mg/dL (ref 0.3–1.2)
Total Protein: 6.2 g/dL — ABNORMAL LOW (ref 6.5–8.1)

## 2020-04-14 LAB — VALPROIC ACID LEVEL: Valproic Acid Lvl: 32 ug/mL — ABNORMAL LOW (ref 50.0–100.0)

## 2020-04-14 MED ORDER — LEVETIRACETAM 750 MG PO TABS: 750.0000 mg | ORAL_TABLET | Freq: Two times a day (BID) | ORAL | 1 refills | Status: DC

## 2020-04-14 MED ORDER — LEVETIRACETAM IN NACL 1000 MG/100ML IV SOLN
1000.0000 mg | Freq: Once | INTRAVENOUS | Status: AC
  Administered 2020-04-14: 1000 mg via INTRAVENOUS
  Filled 2020-04-14: qty 100

## 2020-04-14 NOTE — ED Triage Notes (Signed)
Pt BIB [facility] from home c/o unwitnessed fall. Pt was found down in his driveway. Pt does not recall the fall at all so possibly had a syncopal episode. Pt denies any pain at this time but does have an abrasion to the back of the head. Pt denies the use of blood thinners. Per EMS pt was initially on A&Ox1. Pt is now A&Ox4.

## 2020-04-14 NOTE — ED Provider Notes (Signed)
Pt seen by Dr Jeanell Sparrow, please see her note.  Pt with probable recurrent seizure.  Now back to baseline.  Plan to follow up labs and scans.  If negative, dc back on keppra.  Will need an rx.  Labs reviewed.  CBC unremarkable.  Metabolic panel unremarkable.  Valproic acid level low.  Head CT without acute findings.  CT of C-spine without cervical spine injury but questionable first rib injury.  Pt does not have any tenderness left first rib on exam.  Suspect incidental finding.  Discussed findings.  Stable for discharge.   Dorie Rank, MD 04/14/20 [phone]

## 2020-04-14 NOTE — Discharge Instructions (Addendum)
Start taking the [place] again.  Continue your Depakote.  Follow-up with Dr. Melrose Nakayama to discuss your medication regimen considering the recurrent seizure you had today.

## 2020-04-14 NOTE — ED Provider Notes (Signed)
[facility]ER[facility]ENC[facility] DEPARTMENT Provider Note   CSN: [id number] Arrival date & time: 04/14/20  1357     History Chief Complaint  Patient presents with  . Fall    Samuel Mayo is a 75 y.o. male. Level 5 caveat secondary to amnestic for event HPI 75 yo male history sz disorder, recently changed from keppra to , was found down in the driveway.  Decreased responsiveness when found, a friend, [name], states that he was laying in the driveway with abrasions to his head, eyes closed and did not speak.  EMS was called.  She reports that by the time EMS was pleading with him he was following commands.  He reports to me that he thinks he had a seizure.  Reports recent change from keppra to unknown medicine- Review of neurology clinic note from 04/05/20 reports that he was to stop keppra and call office if anything changed    Girlfriend [name] contact number [phone] Past Medical History:  Diagnosis Date  . AK (actinic keratosis)   . Allergy   . Chicken pox   . Compression fracture of L1 lumbar vertebra (HCC)   . Compression fracture of thoracic vertebra (HCC)   . Hypertension   . Lentigines    Follows with DR Culton (derma)  . Seizures [facility])     Patient Active Problem List   Diagnosis Date Noted  . Diarrhea in adult patient 03/27/2018  . Gout, arthritis- R MTP joint. 11/26/2017  . Vitamin D deficiency, unspecified 01/30/2017  . Osteoporosis 10/23/2016  . Benign essential hypertension 10/23/2016  . Complex partial epilepsy ([place]) 09/21/2015  . Status epilepticus ([place]) 09/16/2015    Past Surgical History:  Procedure Laterality Date  . COLONOSCOPY    . mitral vavle     Congetinal mitral valve disease.       Family History  Problem Relation Age of Onset  . Heart disease Mother   . Cancer Father   . Heart disease Father   . Prostate cancer Father   . Aneurysm Paternal Grandfather   . Heart disease Son        congenital mitral valve dz  .  Colon cancer Neg Hx   . Esophageal cancer Neg Hx   . Stomach cancer Neg Hx   . Rectal cancer Neg Hx     Social History   Tobacco Use  . Smoking status: Never Smoker  . Smokeless tobacco: Never Used  Substance Use Topics  . Alcohol use: Yes    Alcohol/week: 1.0 standard drink    Types: 1 Cans of beer per week    Comment: socially  . Drug use: No    Home Medications Prior to Admission medications   Medication Sig Start Date End Date Taking? Authorizing Provider  amLODipine (NORVASC) 5 MG tablet Take 1 tablet (5 mg total) by mouth daily. 02/18/20   Martinique, [name], MD  Cholecalciferol (VITAMIN D3) 10000 units TABS Take 1 tablet by mouth daily.    [provider]  divalproex (DEPAKOTE) 250 MG DR tablet Take 250 mg by mouth 2 (two) times daily. 03/03/20   [provider]  gabapentin (NEURONTIN) 300 MG capsule Take 300 mg by mouth daily. 03/07/20   [provider]  ibandronate (BONIVA) 150 MG tablet TAKE 1 TABLET BY MOUTH EVERY 30 DAYS. TAKE IN AM WITH FULL GLASS OF WATER ON EMPTY STOMACH AND DON'T TAKE ANYTHING ELSE BY MOUTH OR LIE DOWN FOR THE NEXT 30 MINUTES 01/31/20   Martinique,  Malka So, MD  levETIRAcetam (KEPPRA) 750 MG tablet Take 750 mg by mouth at bedtime.    [provider]  Multiple Vitamin (MULTIVITAMIN WITH MINERALS) TABS tablet Take 1 tablet by mouth See admin instructions. Take 1 tablet by mouth every 2-3 days    [provider]  Protein (NUTRA/PRO VANILLA) PACK Take by mouth.    [provider]    Allergies    Other  Review of Systems   Review of Systems  All other systems reviewed and are negative.   Physical Exam Updated Vital Signs BP (!) 154/112 (BP Location: Right Arm)   Pulse (!) 108   Temp 98.7 F (37.1 C) (Oral)   Resp (!) 21   Ht 1.854 m (6\' 1" )   Wt 90.7 kg   SpO2 95%   BMI 26.39 kg/m   Physical Exam Vitals and nursing note reviewed.  Constitutional:      Appearance: Normal appearance.  HENT:      Head: Normocephalic.     Comments: Abrasion to forehead and to occiput    Nose: Nose normal.     Mouth/Throat:     Mouth: Mucous membranes are moist.     Comments: Abrasion to left lateral tongue Eyes:     Extraocular Movements: Extraocular movements intact.     Pupils: Pupils are equal, round, and reactive to light.  Neck:     Comments: Cervical collar in place No signs of obvious external trauma No point tenderness over cervical spine Cardiovascular:     Rate and Rhythm: Normal rate and regular rhythm.     Pulses: Normal pulses.     Heart sounds: Normal heart sounds.  Pulmonary:     Effort: Pulmonary effort is normal.     Breath sounds: Normal breath sounds.  Abdominal:     General: Abdomen is flat.     Palpations: Abdomen is soft.  Musculoskeletal:        General: Normal range of motion.     Comments: Abrasions over dorsum bilateral fingers No point bony tenderness Full active range of motion of fingers and wrist  Skin:    General: Skin is warm and dry.     Capillary Refill: Capillary refill takes less than 2 seconds.  Neurological:     General: No focal deficit present.     Mental Status: He is alert.  Psychiatric:        Mood and Affect: Mood normal.        Behavior: Behavior normal.     ED Results / Procedures / Treatments   Labs (all labs ordered are listed, but only abnormal results are displayed) Labs Reviewed - No data to display  EKG None  Radiology No results found.  Procedures Procedures   Medications Ordered in ED Medications - No data to display  ED Course  I have reviewed the triage vital signs and the nursing notes.  Pertinent labs & imaging results that were available during my care of the patient were reviewed by me and considered in my medical decision making (see chart for details).    MDM Rules/Calculators/A&P                          Patient fell in the driveway after falling to ground.  No witnessed seizure.  However,  given the patient has just had his Keppra stopped, description of postictal phase, and abrasion to tongue consistent with biting are all highly suggestive  that this was a seizure.  Will restart Keppra Patient care discussed with Dr. Tomi Bamberger who  will review findings of tests and disposition as appropriate Final Clinical Impression(s) / ED Diagnoses Final diagnoses:  Seizure ([place])  Abrasion of head, initial encounter  Abrasion of hand, unspecified laterality, initial encounter    Rx / DC Orders ED Discharge Orders    None       Pattricia Boss, MD 04/14/20 1604

## 2020-05-19 ENCOUNTER — Telehealth: Payer: Self-pay | Admitting: Neurology

## 2020-05-19 NOTE — Telephone Encounter (Signed)
Left message for patient to call back and schedule Medicare Annual Wellness Visit (AWV) either virtually or in office. No detailed message left   Last AWV 02/02/18  please schedule at anytime with [facility] Nurse Health Advisor 1 or 2   This should be a 45 minute visit.

## 2020-06-08 ENCOUNTER — Ambulatory Visit: Payer: Medicare Other

## 2020-06-19 DIAGNOSIS — L821 Other seborrheic keratosis: Secondary | ICD-10-CM | POA: Diagnosis not present

## 2020-06-19 DIAGNOSIS — Z85828 Personal history of other malignant neoplasm of skin: Secondary | ICD-10-CM | POA: Diagnosis not present

## 2020-06-19 DIAGNOSIS — D1801 Hemangioma of skin and subcutaneous tissue: Secondary | ICD-10-CM | POA: Diagnosis not present

## 2020-06-19 DIAGNOSIS — L814 Other melanin hyperpigmentation: Secondary | ICD-10-CM | POA: Diagnosis not present

## 2020-06-19 DIAGNOSIS — L570 Actinic keratosis: Secondary | ICD-10-CM | POA: Diagnosis not present

## 2020-06-19 DIAGNOSIS — L218 Other seborrheic dermatitis: Secondary | ICD-10-CM | POA: Diagnosis not present

## 2020-06-19 DIAGNOSIS — C4441 Basal cell carcinoma of skin of scalp and neck: Secondary | ICD-10-CM | POA: Diagnosis not present

## 2020-06-19 DIAGNOSIS — C44212 Basal cell carcinoma of skin of right ear and external auricular canal: Secondary | ICD-10-CM | POA: Diagnosis not present

## 2020-06-19 DIAGNOSIS — L820 Inflamed seborrheic keratosis: Secondary | ICD-10-CM | POA: Diagnosis not present

## 2020-06-19 DIAGNOSIS — D485 Neoplasm of uncertain behavior of skin: Secondary | ICD-10-CM | POA: Diagnosis not present

## 2020-07-04 DIAGNOSIS — G2581 Restless legs syndrome: Secondary | ICD-10-CM | POA: Diagnosis not present

## 2020-07-04 DIAGNOSIS — R350 Frequency of micturition: Secondary | ICD-10-CM | POA: Diagnosis not present

## 2020-07-04 DIAGNOSIS — R569 Unspecified convulsions: Secondary | ICD-10-CM | POA: Diagnosis not present

## 2020-10-11 DIAGNOSIS — R569 Unspecified convulsions: Secondary | ICD-10-CM | POA: Diagnosis not present

## 2020-10-11 DIAGNOSIS — G2581 Restless legs syndrome: Secondary | ICD-10-CM | POA: Diagnosis not present

## 2020-10-11 DIAGNOSIS — G479 Sleep disorder, unspecified: Secondary | ICD-10-CM | POA: Diagnosis not present

## 2020-10-30 DIAGNOSIS — H2513 Age-related nuclear cataract, bilateral: Secondary | ICD-10-CM | POA: Diagnosis not present

## 2020-10-30 DIAGNOSIS — H43813 Vitreous degeneration, bilateral: Secondary | ICD-10-CM | POA: Diagnosis not present

## 2020-10-30 DIAGNOSIS — H4423 Degenerative myopia, bilateral: Secondary | ICD-10-CM | POA: Diagnosis not present

## 2020-11-02 DIAGNOSIS — L814 Other melanin hyperpigmentation: Secondary | ICD-10-CM | POA: Diagnosis not present

## 2020-11-02 DIAGNOSIS — C44212 Basal cell carcinoma of skin of right ear and external auricular canal: Secondary | ICD-10-CM | POA: Diagnosis not present

## 2020-11-02 DIAGNOSIS — L570 Actinic keratosis: Secondary | ICD-10-CM | POA: Diagnosis not present

## 2020-11-02 DIAGNOSIS — L821 Other seborrheic keratosis: Secondary | ICD-10-CM | POA: Diagnosis not present

## 2020-11-02 DIAGNOSIS — Z85828 Personal history of other malignant neoplasm of skin: Secondary | ICD-10-CM | POA: Diagnosis not present

## 2020-12-11 DIAGNOSIS — G2581 Restless legs syndrome: Secondary | ICD-10-CM | POA: Diagnosis not present

## 2020-12-11 DIAGNOSIS — Z79899 Other long term (current) drug therapy: Secondary | ICD-10-CM | POA: Diagnosis not present

## 2020-12-11 DIAGNOSIS — E538 Deficiency of other specified B group vitamins: Secondary | ICD-10-CM | POA: Diagnosis not present

## 2020-12-11 DIAGNOSIS — R569 Unspecified convulsions: Secondary | ICD-10-CM | POA: Diagnosis not present

## 2020-12-11 DIAGNOSIS — G479 Sleep disorder, unspecified: Secondary | ICD-10-CM | POA: Diagnosis not present

## 2020-12-11 DIAGNOSIS — E559 Vitamin D deficiency, unspecified: Secondary | ICD-10-CM | POA: Diagnosis not present

## 2021-01-15 DIAGNOSIS — G479 Sleep disorder, unspecified: Secondary | ICD-10-CM | POA: Diagnosis not present

## 2021-01-15 DIAGNOSIS — R569 Unspecified convulsions: Secondary | ICD-10-CM | POA: Diagnosis not present

## 2021-01-15 DIAGNOSIS — G2581 Restless legs syndrome: Secondary | ICD-10-CM | POA: Diagnosis not present

## 2021-02-20 NOTE — Progress Notes (Signed)
HPI: [name] is a 75 y.o.male here today for his routine physical examination.  Last CPE: 02/18/20 He lives with his wife.   Regular exercise 3 or more times per week: He walks his dog in the mornings , 3 miles almost daily. He is also active with yard work.  Following a healthy diet: Hs wife cooks, has decreased sweets intake.  Chronic medical problems: Seizure disorder, hypertension, osteoporosis, OSA, vitamin D deficiency, and vitamin B12 deficiency among some. Next appt with neuro 05/2021, planning on transferring to [facility] if every thing is stable. Last seizure episode in 04/2020, evaluated in the ED. He is not driving.  Head CT 04/14/20:Mild right frontal scalp soft tissue swelling without evidence of an acute fracture or acute intracranial abnormality.  Immunization History  Administered Date(s) Administered   Influenza, High Dose Seasonal PF 12/18/2016, 11/10/2017, 10/30/2018   Influenza-Unspecified 12/18/2016   PFIZER(Purple Top)SARS-COV-2 Vaccination 04/11/2019, 05/06/2019, 12/18/2019   Pneumococcal Conjugate-13 01/31/2017   Pneumococcal Polysaccharide-23 02/02/2018   Tdap 10/05/2013    Health Maintenance  Topic Date Due   INFLUENZA VACCINE  10/02/2020   COVID-19 Vaccine (4 - Booster for [place] series) 03/09/2021 (Originally 02/12/2020)   Zoster Vaccines- Shingrix (1 of 2) 03/09/2026 (Originally 02/21/1965)   TETANUS/TDAP  10/06/2023   Pneumonia Vaccine 37+ Years old  Completed   Hepatitis C Screening  Completed   HPV VACCINES  Aged Out   COLONOSCOPY (Pts 45-60yrs Insurance coverage will need to be confirmed)  Discontinued   -Negative for high alcohol intake or tobacco use.  -Concerns and/or follow up today:   Hx of mitral valve repaired , has not followed with cardio in years. He has had mildly irregular HR in the past but today it is more noticeable. He has not noted palpitations,dizziness,or diaphoresis.  HTN on Amlodipine 5 mg daily. He does not  check BP regularly.  Review of Systems  Constitutional:  Negative for activity change, appetite change and fever.  HENT:  Negative for nosebleeds, sore throat, trouble swallowing and voice change.   Eyes:  Negative for redness and visual disturbance.  Respiratory:  Negative for cough, shortness of breath and wheezing.   Cardiovascular:  Negative for chest pain, palpitations and leg swelling.  Gastrointestinal:  Negative for abdominal pain, blood in stool, nausea and vomiting.  Endocrine: Negative for cold intolerance, heat intolerance, polydipsia, polyphagia and polyuria.  Genitourinary:  Negative for decreased urine volume, dysuria, genital sores, hematuria and testicular pain.  Musculoskeletal:  Negative for gait problem and myalgias.  Skin:  Negative for color change and rash.  Allergic/Immunologic: Positive for environmental allergies.  Neurological:  Negative for syncope, weakness and headaches.  Hematological:  Negative for adenopathy. Does not bruise/bleed easily.  Psychiatric/Behavioral:  Negative for confusion. The patient is not nervous/anxious.   All other systems reviewed and are negative.  Current Outpatient Medications on File Prior to Visit  Medication Sig Dispense Refill   amLODipine (NORVASC) 5 MG tablet Take 1 tablet (5 mg total) by mouth daily. 90 tablet 3   Cholecalciferol (VITAMIN D3) 10000 units TABS Take 1 tablet by mouth daily.     divalproex (DEPAKOTE) 250 MG DR tablet Take 250 mg by mouth 3 (three) times daily.     gabapentin (NEURONTIN) 300 MG capsule Take 300 mg by mouth daily.     ibandronate (BONIVA) 150 MG tablet TAKE 1 TABLET BY MOUTH EVERY 30 DAYS. TAKE IN AM WITH FULL GLASS OF WATER ON EMPTY STOMACH AND DON'T TAKE ANYTHING ELSE  BY MOUTH OR LIE DOWN FOR THE NEXT 30 MINUTES 3 tablet 5   levETIRAcetam (KEPPRA) 750 MG tablet Take 1 tablet (750 mg total) by mouth 2 (two) times daily. 20 tablet 1   Multiple Vitamin (MULTIVITAMIN WITH MINERALS) TABS tablet Take 1  tablet by mouth See admin instructions. Take 1 tablet by mouth every 2-3 days     Protein (NUTRA/PRO VANILLA) PACK Take by mouth.     Current Facility-Administered Medications on File Prior to Visit  Medication Dose Route Frequency Provider Last Rate Last Admin   0.9 %  sodium chloride infusion  500 mL Intravenous Once Cirigliano, Vito V, DO       Past Medical History:  Diagnosis Date   AK (actinic keratosis)    Allergy    Chicken pox    Compression fracture of L1 lumbar vertebra (HCC)    Compression fracture of thoracic vertebra (HCC)    Hypertension    Lentigines    Follows with DR Culton (derma)   Seizures (HCC)     Past Surgical History:  Procedure Laterality Date   COLONOSCOPY     mitral vavle     Congetinal mitral valve disease.    Allergies  Allergen Reactions   Other Other (See Comments)    Neosporin Ophthalmic causes eyes to turn red    Family History  Problem Relation Age of Onset   Heart disease Mother    Cancer Father    Heart disease Father    Prostate cancer Father    Aneurysm Paternal Grandfather    Heart disease Son        congenital mitral valve dz   Colon cancer Neg Hx    Esophageal cancer Neg Hx    Stomach cancer Neg Hx    Rectal cancer Neg Hx     Social History   Socioeconomic History   Marital status: Widowed    Spouse name: Not on file   Number of children: Not on file   Years of education: Not on file   Highest education level: Not on file  Occupational History   Not on file  Tobacco Use   Smoking status: Never   Smokeless tobacco: Never  Substance and Sexual Activity   Alcohol use: Yes    Alcohol/week: 1.0 standard drink    Types: 1 Cans of beer per week    Comment: socially   Drug use: No   Sexual activity: Not on file  Other Topics Concern   Not on file  Social History Narrative   Not on file   Social Determinants of Health   Financial Resource Strain: Not on file  Food Insecurity: Not on file  Transportation  Needs: Not on file  Physical Activity: Not on file  Stress: Not on file  Social Connections: Not on file     Vitals:   02/21/21 0927  BP: 128/80  Pulse: 78  Resp: 16  SpO2: 98%   Body mass index is 26.78 kg/m.   Wt Readings from Last 3 Encounters:  02/21/21 203 lb (92.1 kg)  04/14/20 200 lb (90.7 kg)  03/27/20 205 lb (93 kg)    Physical Exam Vitals and nursing note reviewed.  Constitutional:      General: He is not in acute distress.    Appearance: He is well-developed.  HENT:     Head: Normocephalic and atraumatic.     Right Ear: Tympanic membrane, ear canal and external ear normal.     Left Ear:  Tympanic membrane, ear canal and external ear normal.     Mouth/Throat:     Mouth: Mucous membranes are moist.     Pharynx: Oropharynx is clear.  Eyes:     Extraocular Movements: Extraocular movements intact.     Conjunctiva/sclera: Conjunctivae normal.     Pupils: Pupils are equal, round, and reactive to light.  Neck:     Thyroid: No thyromegaly.     Trachea: No tracheal deviation.  Cardiovascular:     Rate and Rhythm: Normal rate. Rhythm irregular.     Pulses:          Dorsalis pedis pulses are 2+ on the right side and 2+ on the left side.     Heart sounds: No murmur heard.    Comments: Trace pitting LE edema, bilateral. Pulmonary:     Effort: Pulmonary effort is normal. No respiratory distress.     Breath sounds: Normal breath sounds.  Abdominal:     Palpations: Abdomen is soft. There is no hepatomegaly or mass.     Tenderness: There is no abdominal tenderness.  Musculoskeletal:        General: No tenderness.     Cervical back: Normal range of motion.     Comments: No signs of synovitis.  Lymphadenopathy:     Cervical: No cervical adenopathy.     Upper Body:     Right upper body: No supraclavicular adenopathy.     Left upper body: No supraclavicular adenopathy.  Skin:    General: Skin is warm.     Findings: No erythema.  Neurological:     General: No  focal deficit present.     Mental Status: He is alert and oriented to person, place, and time.     Cranial Nerves: No cranial nerve deficit.     Gait: Gait normal.     Deep Tendon Reflexes:     Reflex Scores:      Bicep reflexes are 2+ on the right side and 2+ on the left side.      Patellar reflexes are 2+ on the right side and 2+ on the left side. Psychiatric:        Mood and Affect: Mood and affect normal.    ASSESSMENT AND PLAN:  Mr.Rowin was seen today for annual exam.  Diagnoses and all orders for this visit: Orders Placed This Encounter  Procedures   Basic metabolic panel   TSH   CBC   Ambulatory referral to Cardiology   POC HgB A1c   EKG 12-Lead   Lab Results  Component Value Date   WBC 5.2 02/21/2021   HGB 13.6 02/21/2021   HCT 40.9 02/21/2021   MCV 98.4 02/21/2021   PLT 197.0 02/21/2021   Lab Results  Component Value Date   TSH 2.47 02/21/2021   Lab Results  Component Value Date   CREATININE 1.09 02/21/2021   BUN 24 (H) 02/21/2021   NA 137 02/21/2021   K 4.3 02/21/2021   CL 102 02/21/2021   CO2 30 02/21/2021   Lab Results  Component Value Date   HGBA1C 5.2 02/21/2021   Routine general medical examination at a health care facility We discussed the importance of regular physical activity and healthy diet for prevention of chronic illness and/or complications. Preventive guidelines reviewed. Vaccination up-to-date. Next CPE in a year.  Benign essential hypertension BP adequately controlled. Continue amlodipine 5 mg daily.  Elevated glucose level Consistency with a healthy lifestyle is encouraged for diabetes prevention.  Irregular heart  rate Asymptomatic. In the past occasional extra systoles but today on auscultation it seemed to be worse. Prior EKG with PVC's.  Atrial flutter, unspecified type ([place]) New problem. Discussed diagnosis, prognosis, and treatment options. Explained that anticoagulation is recommended, the risk of embolic  complication may not be as high as with atrial fibrillation. We discussed some side effects of oral anticoagulation. He reports taken aspirin before and had forearm bruising.  CHA(2)DS(2)-VASc score 3 (for hypertension and age). Because his history of seizure disorder and risk of head trauma, I am concerned about risk of bleeding. I have recommended starting Aspirin 81 mg daily.  Explained that I will call him later today after lab results and will make final recommendations in regard to oral anticoagulation.  We discussed some side effects. I placed a cardiology referral as urgent. He was clearly instructed about warning signs.  Return in 1 year (on 02/21/2022) for CPE.  Aziyah Provencal G. Martinique, MD  [facility]. [place] office.

## 2021-02-21 ENCOUNTER — Encounter: Payer: Self-pay | Admitting: Family Medicine

## 2021-02-21 ENCOUNTER — Ambulatory Visit (INDEPENDENT_AMBULATORY_CARE_PROVIDER_SITE_OTHER): Payer: Medicare Other | Admitting: Family Medicine

## 2021-02-21 VITALS — BP 128/80 | HR 78 | Resp 16 | Ht 73.0 in | Wt 203.0 lb

## 2021-02-21 DIAGNOSIS — I10 Essential (primary) hypertension: Secondary | ICD-10-CM | POA: Diagnosis not present

## 2021-02-21 DIAGNOSIS — I4892 Unspecified atrial flutter: Secondary | ICD-10-CM

## 2021-02-21 DIAGNOSIS — R7309 Other abnormal glucose: Secondary | ICD-10-CM

## 2021-02-21 DIAGNOSIS — Z0000 Encounter for general adult medical examination without abnormal findings: Secondary | ICD-10-CM

## 2021-02-21 DIAGNOSIS — I499 Cardiac arrhythmia, unspecified: Secondary | ICD-10-CM

## 2021-02-21 LAB — BASIC METABOLIC PANEL
BUN: 24 mg/dL — ABNORMAL HIGH (ref 6–23)
CO2: 30 mEq/L (ref 19–32)
Calcium: 8.8 mg/dL (ref 8.4–10.5)
Chloride: 102 mEq/L (ref 96–112)
Creatinine, Ser: 1.09 mg/dL (ref 0.40–1.50)
GFR: 65.7 mL/min (ref >60.00)
Glucose, Bld: 98 mg/dL (ref 70–99)
Potassium: 4.3 mEq/L (ref 3.5–5.1)
Sodium: 137 mEq/L (ref 135–145)

## 2021-02-21 LAB — CBC
HCT: 40.9 % (ref 39.0–52.0)
Hemoglobin: 13.6 g/dL (ref 13.0–17.0)
MCHC: 33.2 g/dL (ref 30.0–36.0)
MCV: 98.4 fl (ref 78.0–100.0)
Platelets: 197 10*3/uL (ref 150.0–400.0)
RBC: 4.16 Mil/uL — ABNORMAL LOW (ref 4.22–5.81)
RDW: 12.8 % (ref 11.5–15.5)
WBC: 5.2 10*3/uL (ref 4.0–10.5)

## 2021-02-21 LAB — POCT GLYCOSYLATED HEMOGLOBIN (HGB A1C): Hemoglobin A1C: 5.2 % (ref 4.0–5.6)

## 2021-02-21 LAB — TSH: TSH: 2.47 u[IU]/mL (ref 0.35–5.50)

## 2021-02-21 MED ORDER — AMLODIPINE BESYLATE 5 MG PO TABS: 5.0000 mg | ORAL_TABLET | Freq: Every day | ORAL | 3 refills | Status: DC

## 2021-02-21 NOTE — Patient Instructions (Addendum)
A few things to remember from today's visit:  Routine general medical examination at a health care facility  Benign essential hypertension  Elevated glucose level - Plan: POC HgB A1c  Irregular heart rate - Plan: EKG 12-Lead, CBC  Atrial flutter, unspecified type ([place]) - Plan: Ambulatory referral to Cardiology, Basic metabolic panel, TSH  Today new findings on EKG, atrial flutter, I have placed a referral to cardio. Anticoagulation is indicated, for now Aspirin 81 mg daily.I will let you know today if we need Eliquis or another medication.  Do not use My Chart to request refills or for acute issues that need immediate attention. Please be sure medication list is accurate. If a new problem present, please set up appointment sooner than planned today. Preventive Care 23 Years and Older, Male Preventive care refers to lifestyle choices and visits with your health care provider that can promote health and wellness. Preventive care visits are also called wellness exams. What can I expect for my preventive care visit? Counseling During your preventive care visit, your health care provider may ask about your: Medical history, including: Past medical problems. Family medical history. History of falls. Current health, including: Emotional well-being. Home life and relationship well-being. Sexual activity. Memory and ability to understand (cognition). Lifestyle, including: Alcohol, nicotine or tobacco, and drug use. Access to firearms. Diet, exercise, and sleep habits. Work and work Statistician. Sunscreen use. Safety issues such as seatbelt and bike helmet use. Physical exam Your health care provider will check your: Height and weight. These may be used to calculate your BMI (body mass index). BMI is a measurement that tells if you are at a healthy weight. Waist circumference. This measures the distance around your waistline. This measurement also tells if you are at a healthy weight  and may help predict your risk of certain diseases, such as type 2 diabetes and high blood pressure. Heart rate and blood pressure. Body temperature. Skin for abnormal spots. What immunizations do I need? Vaccines are usually given at various ages, according to a schedule. Your health care provider will recommend vaccines for you based on your age, medical history, and lifestyle or other factors, such as travel or where you work. What tests do I need? Screening Your health care provider may recommend screening tests for certain conditions. This may include: Lipid and cholesterol levels. Diabetes screening. This is done by checking your blood sugar (glucose) after you have not eaten for a while (fasting). Hepatitis C test. Hepatitis B test. HIV (human immunodeficiency virus) test. STI (sexually transmitted infection) testing, if you are at risk. Lung cancer screening. Colorectal cancer screening. Prostate cancer screening. Abdominal aortic aneurysm (AAA) screening. You may need this if you are a current or former smoker. Talk with your health care provider about your test results, treatment options, and if necessary, the need for more tests. Follow these instructions at home: Eating and drinking  Eat a diet that includes fresh fruits and vegetables, whole grains, lean protein, and low-fat dairy products. Limit your intake of foods with high amounts of sugar, saturated fats, and salt. Take vitamin and mineral supplements as recommended by your health care provider. Do not drink alcohol if your health care provider tells you not to drink. If you drink alcohol: Limit how much you have to 0-2 drinks a day. Know how much alcohol is in your drink. In the [place] one drink equals one 12 oz bottle of beer (355 mL), one 5 oz glass of wine (148 mL), or one  1 oz glass of hard liquor (44 mL). Lifestyle Brush your teeth every morning and night with fluoride toothpaste. Floss one time each  day. Exercise for at least 30 minutes 5 or more days each week. Do not use any products that contain nicotine or tobacco. These products include cigarettes, chewing tobacco, and vaping devices, such as e-cigarettes. If you need help quitting, ask your health care provider. Do not use drugs. If you are sexually active, practice safe sex. Use a condom or other form of protection to prevent STIs. Take aspirin only as told by your health care provider. Make sure that you understand how much to take and what form to take. Work with your health care provider to find out whether it is safe and beneficial for you to take aspirin daily. Ask your health care provider if you need to take a cholesterol-lowering medicine (statin). Find healthy ways to manage stress, such as: Meditation, yoga, or listening to music. Journaling. Talking to a trusted person. Spending time with friends and family. Safety Always wear your seat belt while driving or riding in a vehicle. Do not drive: If you have been drinking alcohol. Do not ride with someone who has been drinking. When you are tired or distracted. While texting. If you have been using any mind-altering substances or drugs. Wear a helmet and other protective equipment during sports activities. If you have firearms in your house, make sure you follow all gun safety procedures. Minimize exposure to UV radiation to reduce your risk of skin cancer. What's next? Visit your health care provider once a year for an annual wellness visit. Ask your health care provider how often you should have your eyes and teeth checked. Stay up to date on all vaccines. This information is not intended to replace advice given to you by your health care provider. Make sure you discuss any questions you have with your health care provider. Document Revised: 08/16/2020 Document Reviewed: 08/16/2020 Elsevier Patient Education  [place].

## 2021-03-03 IMAGING — CT CT CERVICAL SPINE W/O CM
3 of 4 series · 13 of 33 positions shown, 16 images · non-contrast
Comparison: None.

CLINICAL DATA: Unwitnessed fall.

EXAM:
CT CERVICAL SPINE WITHOUT CONTRAST
TECHNIQUE: Multidetector CT imaging of the cervical spine was performed without
intravenous contrast. Multiplanar CT image reconstructions were also
generated.

[Series 4: c_spine 2.0 st · axial · 0.48mm/px · z∈[-260,-134]mm · 5 of 95 slices shown, 7 images]
[im 16/95  soft-tissue]
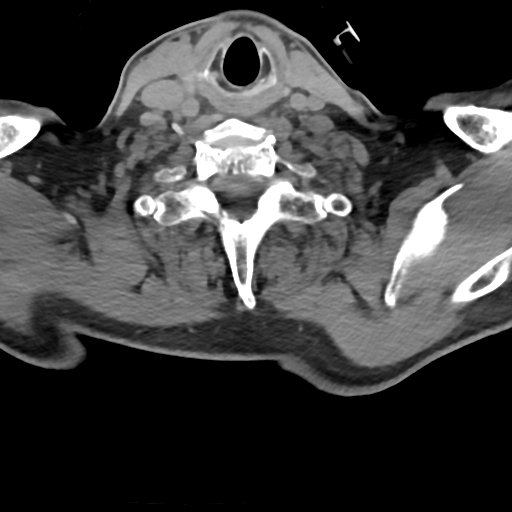
[im 16/95  bone]
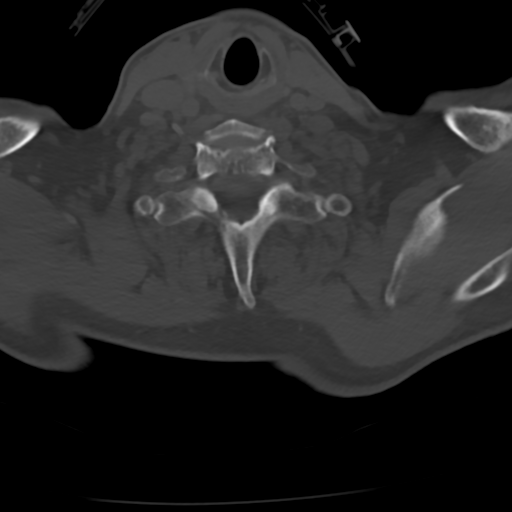
[im 32/95  bone]
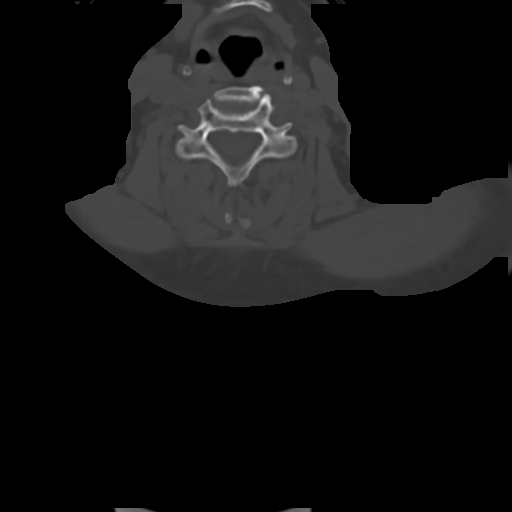
[im 48/95  bone]
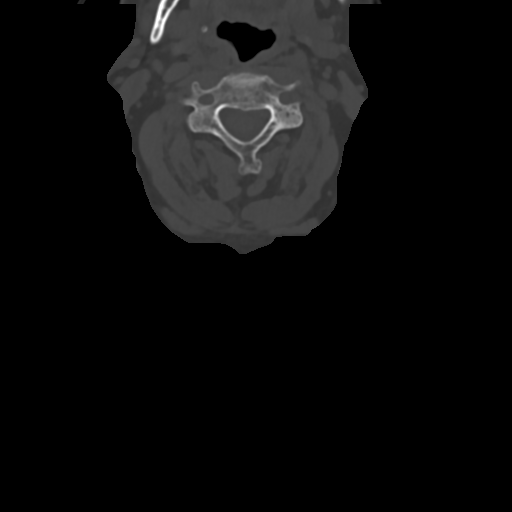
[im 63/95  bone]
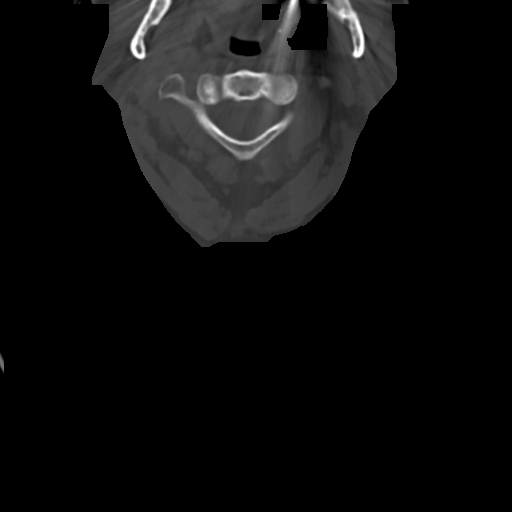
[im 79/95  soft-tissue]
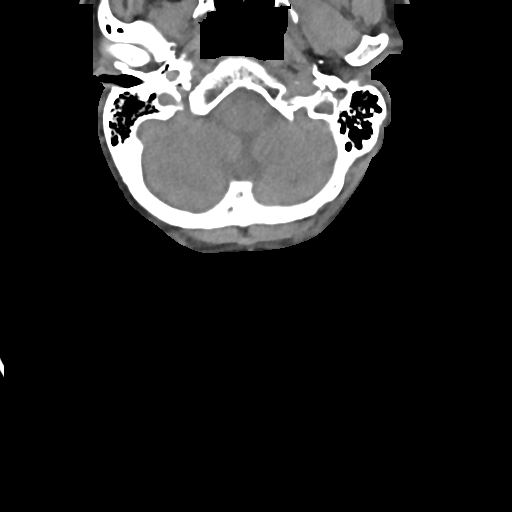
[im 79/95  bone]
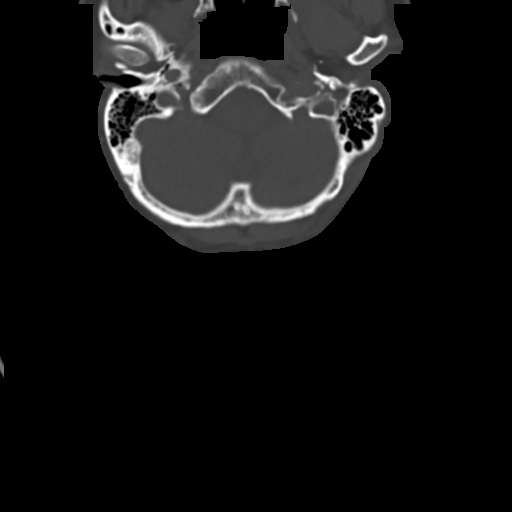

[Series 6: c_spine 2.0 sag bone · sagittal · 0.37mm/px · 5 of 61 slices shown, 6 images]
[im 21/61  bone]
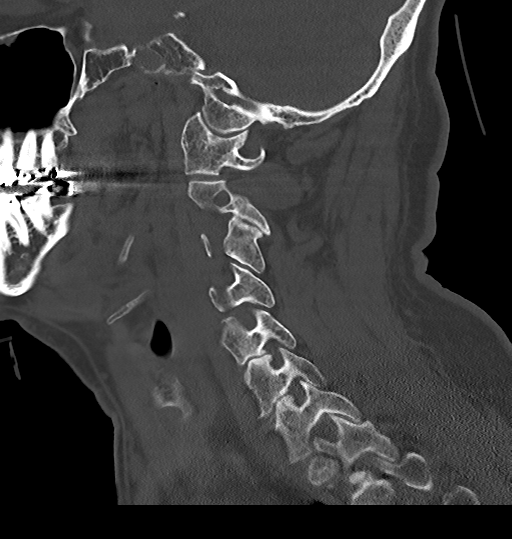
[im 26/61  bone]
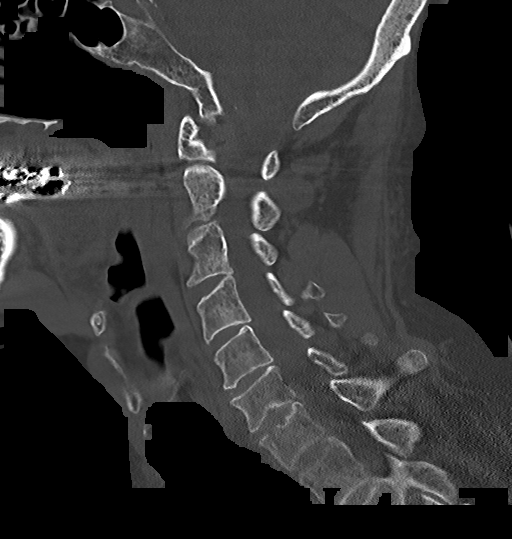
[im 31/61  soft-tissue]
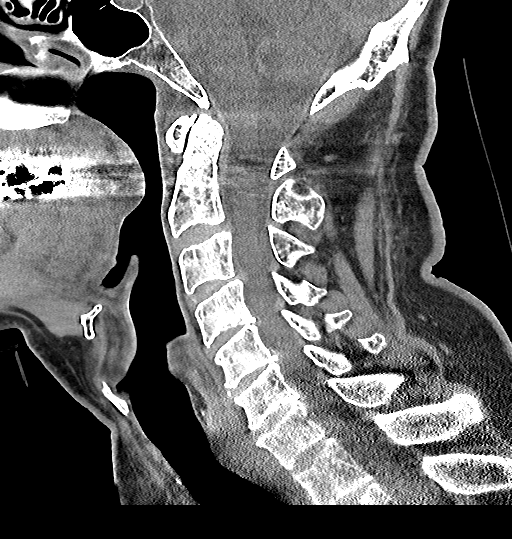
[im 31/61  bone]
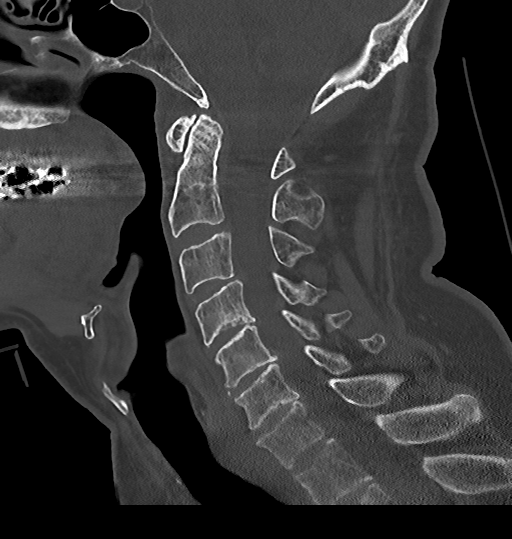
[im 36/61  bone]
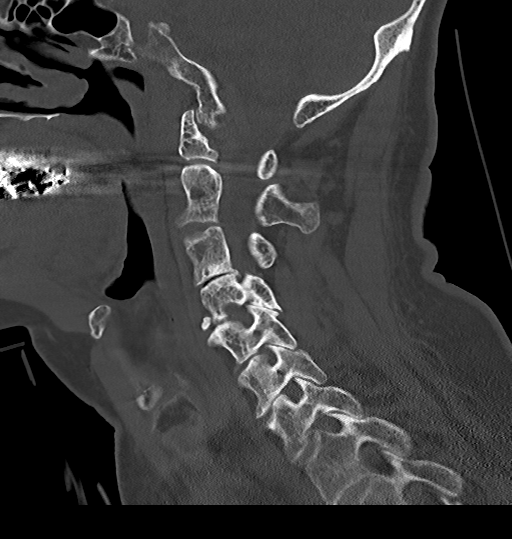
[im 41/61  bone]
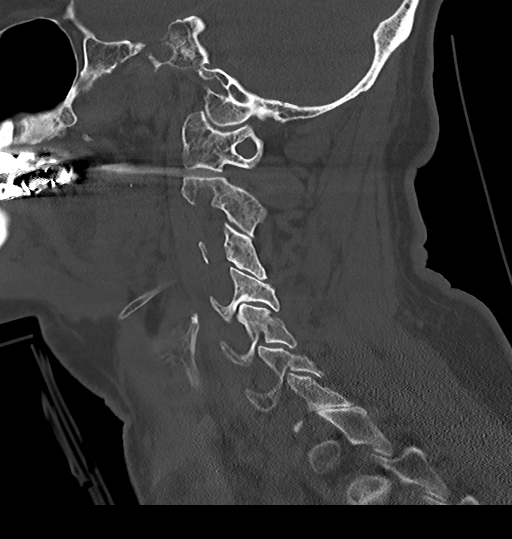

[Series 7: c_spine 2.0 cor bone · coronal · 0.28mm/px · 3 of 61 slices shown]
[im 13/61  bone]
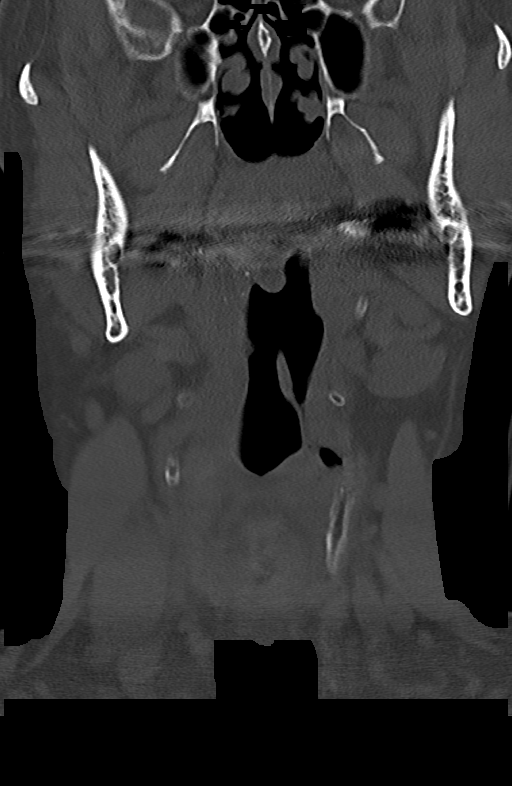
[im 25/61  bone]
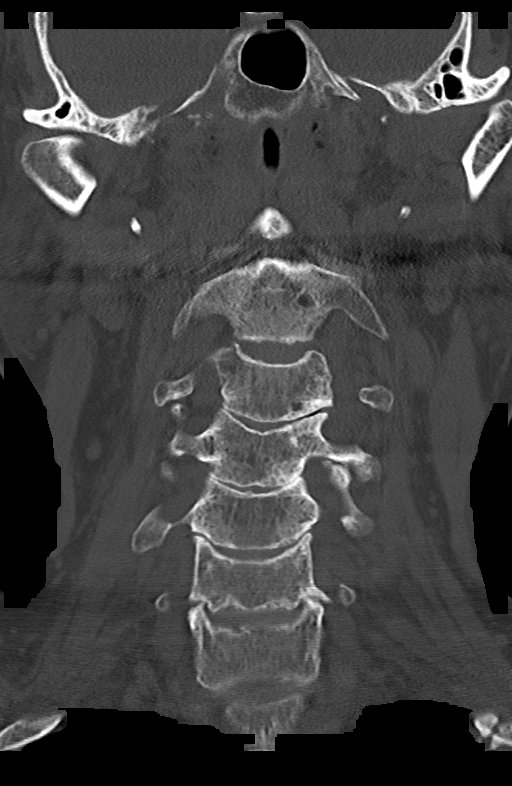
[im 37/61  bone]
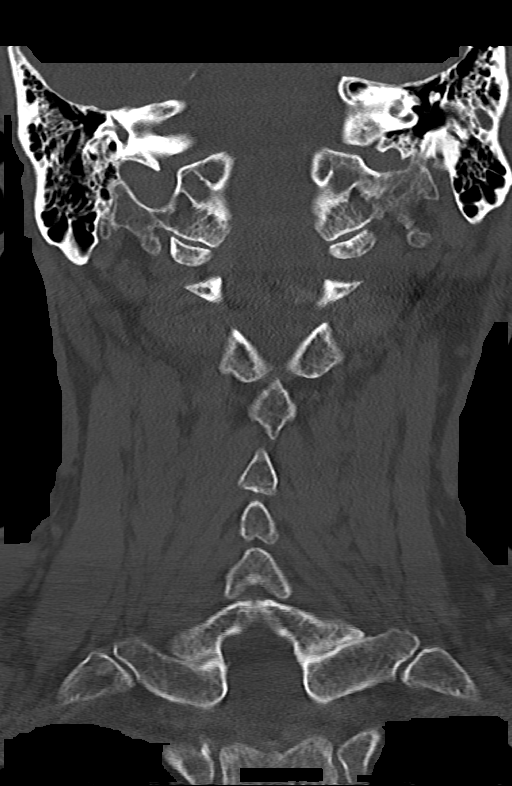

[13 of 33 positions shown; findings below may reference images not displayed]

FINDINGS: Alignment: Normal.

Skull base and vertebrae: No acute fracture. No primary bone lesion
or focal pathologic process.

Soft tissues and spinal canal: No prevertebral fluid or swelling. No
visible canal hematoma.

Disc levels: Mild multilevel endplate sclerosis is seen from the
level of through C7.

Mild to moderate severity intervertebral disc space narrowing is
noted at the levels of C5-C6 and C6-C7, with mild intervertebral
disc space narrowing noted throughout the remainder of the cervical
spine.

Mild to moderate severity bilateral multilevel facet joint
hypertrophy is noted.

Upper chest: A nondisplaced fracture deformity of indeterminate age
is seen involving the first left rib.

Other: None.
IMPRESSION: 1. No acute fracture or subluxation of the cervical spine.
2. Moderate severity degenerative changes of the cervical spine, as
described above.
3. Nondisplaced fracture deformity of indeterminate age involving
the first left rib. Correlation with point tenderness is
recommended.

## 2021-03-19 ENCOUNTER — Encounter: Payer: Self-pay | Admitting: Internal Medicine

## 2021-03-19 ENCOUNTER — Ambulatory Visit: Payer: Medicare Other | Admitting: Internal Medicine

## 2021-03-19 ENCOUNTER — Other Ambulatory Visit: Payer: Self-pay

## 2021-03-19 VITALS — BP 148/102 | HR 112 | Ht 72.0 in | Wt 203.4 lb

## 2021-03-19 DIAGNOSIS — I4892 Unspecified atrial flutter: Secondary | ICD-10-CM

## 2021-03-19 MED ORDER — METOPROLOL SUCCINATE ER 25 MG PO TB24: 25.0000 mg | ORAL_TABLET | Freq: Every day | ORAL | 3 refills | Status: DC

## 2021-03-19 MED ORDER — APIXABAN 5 MG PO TABS: 5.0000 mg | ORAL_TABLET | Freq: Two times a day (BID) | ORAL | 3 refills | Status: DC

## 2021-03-19 NOTE — Patient Instructions (Signed)
Medication Instructions:   START: ELIQUIS 5mg  TWICE DAILY  START: METOPROLOL SUCCINATE 25mg  ONCE DAILY   *If you need a refill on your cardiac medications before your next appointment, please call your pharmacy*  Testing/Procedures: Your physician has requested that you have an echocardiogram. Echocardiography is a painless test that uses sound waves to create images of your heart. It provides your doctor with information about the size and shape of your heart and how well your hearts chambers and valves are working. You may receive an ultrasound enhancing agent through an IV if needed to better visualize your heart during the echo.This procedure takes approximately one hour. There are no restrictions for this procedure. This will take place at the [address], [address].   Follow-Up: At [facility], you and your health needs are our priority.  As part of our continuing mission to provide you with exceptional heart care, we have created designated Provider Care Teams.  These Care Teams include your primary Cardiologist (physician) and Advanced Practice Providers (APPs -  Physician Assistants and Nurse Practitioners) who all work together to provide you with the care you need, when you need it.  Your next appointment:   3 month(s)  The format for your next appointment:   In Person  Provider:   Janina Mayo, MD    Other Instructions REFERRAL TO [place]

## 2021-03-19 NOTE — Progress Notes (Signed)
Cardiology Office Note:    Date:  03/19/2021   ID:  Samuel Mayo Jul 05, 1945, MRN [id number]  PCP:  Martinique, [name], MD   [facility] Prov[facility] Cardiologist:  Janina Mayo, MD     Referring MD: Martinique, [name], MD   No chief complaint on file. Atrial Flutter  History of Present Illness:    Samuel Mayo is a 76 y.o. male with a hx of  HTN, seizures referral for atrial flutter  He had mitral valve surgery 20 years ago in [place], [place]. He said he had no problems afterwards and followed with cardiology. He moved here to [facility]  He was diagnosed with atrial flutter with his PCP at the end of December.  He denies hx of  stress test or LHC. LDL 74 mg/dL. He's never been on metoprolol. He prefers less medications. He has no bleeding history. He is asymptomatic.   Stroke- No Sleep apnea hx- No TSH- 2.4  Blood pressure mildly elevated in the system 140s-150s SBP; at home in the 130s.  EKG 02/21/2021- atrial flutter with variable A-V block  Past Medical History:  Diagnosis Date   AK (actinic keratosis)    Allergy    Chicken pox    Compression fracture of L1 lumbar vertebra (HCC)    Compression fracture of thoracic vertebra (HCC)    Hypertension    Lentigines    Follows with DR Culton (derma)   Seizures (HCC)     Past Surgical History:  Procedure Laterality Date   COLONOSCOPY     mitral vavle     Congetinal mitral valve disease.    Current Medications: Current Meds  Medication Sig   amLODipine (NORVASC) 5 MG tablet Take 1 tablet (5 mg total) by mouth daily.   apixaban (ELIQUIS) 5 MG TABS tablet Take 1 tablet (5 mg total) by mouth 2 (two) times daily.   Cholecalciferol (VITAMIN D3) 10000 units TABS Take 1 tablet by mouth daily.   divalproex (DEPAKOTE) 250 MG DR tablet Take 250 mg by mouth 3 (three) times daily.   gabapentin (NEURONTIN) 300 MG capsule Take 300 mg by mouth daily.   ibandronate (BONIVA) 150 MG tablet TAKE 1 TABLET BY MOUTH EVERY  30 DAYS. TAKE IN AM WITH FULL GLASS OF WATER ON EMPTY STOMACH AND DON'T TAKE ANYTHING ELSE BY MOUTH OR LIE DOWN FOR THE NEXT 30 MINUTES   metoprolol succinate (TOPROL-XL) 25 MG 24 hr tablet Take 1 tablet (25 mg total) by mouth daily.   Multiple Vitamin (MULTIVITAMIN WITH MINERALS) TABS tablet Take 1 tablet by mouth See admin instructions. Take 1 tablet by mouth every 2-3 days   Protein (NUTRA/PRO VANILLA) PACK Take by mouth.   Current Facility-Administered Medications for the 03/19/21 encounter (Office Visit) with Janina Mayo, MD  Medication   0.9 %  sodium chloride infusion     Allergies:   Other   Social History   Socioeconomic History   Marital status: Widowed    Spouse name: Not on file   Number of children: Not on file   Years of education: Not on file   Highest education level: Not on file  Occupational History   Not on file  Tobacco Use   Smoking status: Never   Smokeless tobacco: Never  Substance and Sexual Activity   Alcohol use: Yes    Alcohol/week: 1.0 standard drink    Types: 1 Cans of beer per week    Comment: socially   Drug use: No  Sexual activity: Not on file  Other Topics Concern   Not on file  Social History Narrative   Not on file   Social Determinants of Health   Financial Resource Strain: Not on file  Food Insecurity: Not on file  Transportation Needs: Not on file  Physical Activity: Not on file  Stress: Not on file  Social Connections: Not on file     Family History: The patient's family history includes Aneurysm in his paternal grandfather; Cancer in his father; Heart disease in his father, mother, and son; Prostate cancer in his father. There is no history of Colon cancer, Esophageal cancer, Stomach cancer, or Rectal cancer.  ROS:   Please see the history of present illness.     All other systems reviewed and are negative.  EKGs/Labs/Other Studies Reviewed:    The following studies were reviewed today:   EKG:  EKG is  ordered  today.  The ekg ordered today demonstrates   Atrial flutter with variable block Flutter waves read as STE  Recent Labs: 04/14/2020: ALT 38 02/21/2021: BUN 24; Creatinine, Ser 1.09; Hemoglobin 13.6; Platelets 197.0; Potassium 4.3; Sodium 137; TSH 2.47  Recent Lipid Panel    Component Value Date/Time   CHOL 163 02/16/2019 1051   TRIG 48.0 02/16/2019 1051   HDL 79.10 02/16/2019 1051   CHOLHDL 2 02/16/2019 1051   VLDL 9.6 02/16/2019 1051   LDLCALC 74 02/16/2019 1051     Risk Assessment/Calculations:    CHA2DS2-VASc Score = 3   This indicates a 3.2% annual risk of stroke. The patient's score is based upon: CHF History: 0 HTN History: 1 Diabetes History: 0 Stroke History: 0 Vascular Disease History: 0 Age Score: 2 Gender Score: 0          Physical Exam:    VS:    Vitals:   03/19/21 1559  BP: (!) 148/102  Pulse: (!) 112  SpO2: 97%     Wt Readings from Last 3 Encounters:  03/19/21 203 lb 6.4 oz (92.3 kg)  02/21/21 203 lb (92.1 kg)  04/14/20 200 lb (90.7 kg)     GEN:  Well nourished, well developed in no acute distress HEENT: Normal NECK: No JVD; No carotid bruits LYMPHATICS: No lymphadenopathy CARDIAC: RRR, no murmurs, rubs, gallops RESPIRATORY:  Clear to auscultation without rales, wheezing or rhonchi  ABDOMEN: Soft, non-tender, non-distended MUSCULOSKELETAL:  No edema; No deformity  SKIN: Warm and dry NEUROLOGIC:  Alert and oriented x 3 PSYCHIATRIC:  Normal affect   ASSESSMENT:    #Typical Atrial flutter: Chads2Vasc 3. He presents today with flutter with variable block. He is asymptomatic. Will start with TTE and start AC and BB. Further, will send referral to EP for flutter ablation. -start eliquis 5 mg BID -start metoprolol XL 25 mg daily   -TTE -referral to EP for CTI ablation   PLAN:    In order of problems listed above:  Start eliquis 5 mg BID Start metoprolol XL 25 mg daily  TTE referral to EP for CTI ablation Follow up 3 months          Medication Adjustments/Labs and Tests Ordered: Current medicines are reviewed at length with the patient today.  Concerns regarding medicines are outlined above.  Orders Placed This Encounter  Procedures   Ambulatory referral to Cardiac Electrophysiology   EKG 12-Lead   ECHOCARDIOGRAM COMPLETE   Meds ordered this encounter  Medications   apixaban (ELIQUIS) 5 MG TABS tablet    Sig: Take 1 tablet (5  mg total) by mouth 2 (two) times daily.    Dispense:  60 tablet    Refill:  3   metoprolol succinate (TOPROL-XL) 25 MG 24 hr tablet    Sig: Take 1 tablet (25 mg total) by mouth daily.    Dispense:  30 tablet    Refill:  3    Patient Instructions  Medication Instructions:   START: GYKZLDJ 5mg  TWICE DAILY  START: METOPROLOL SUCCINATE 25mg  ONCE DAILY   *If you need a refill on your cardiac medications before your next appointment, please call your pharmacy*  Testing/Procedures: Your physician has requested that you have an echocardiogram. Echocardiography is a painless test that uses sound waves to create images of your heart. It provides your doctor with information about the size and shape of your heart and how well your hearts chambers and valves are working. You may receive an ultrasound enhancing agent through an IV if needed to better visualize your heart during the echo.This procedure takes approximately one hour. There are no restrictions for this procedure. This will take place at the [address], [address].   Follow-Up: At [facility], you and your health needs are our priority.  As part of our continuing mission to provide you with exceptional heart care, we have created designated Provider Care Teams.  These Care Teams include your primary Cardiologist (physician) and Advanced Practice Providers (APPs -  Physician Assistants and Nurse Practitioners) who all work together to provide you with the care you need, when you need it.  Your next appointment:   3  month(s)  The format for your next appointment:   In Person  Provider:   Janina Mayo, MD    Other Instructions [place] THIS     Signed, Janina Mayo, MD  03/19/2021 4:31 PM    [place]

## 2021-04-03 ENCOUNTER — Ambulatory Visit (HOSPITAL_COMMUNITY): Payer: Medicare Other | Attending: Internal Medicine

## 2021-04-03 ENCOUNTER — Other Ambulatory Visit: Payer: Self-pay

## 2021-04-03 DIAGNOSIS — I4892 Unspecified atrial flutter: Secondary | ICD-10-CM | POA: Diagnosis not present

## 2021-04-03 LAB — ECHOCARDIOGRAM COMPLETE
Area-P 1/2: 2.22 cm2
MV VTI: 2.42 cm2
S' Lateral: 3.3 cm

## 2021-04-23 ENCOUNTER — Ambulatory Visit: Payer: Medicare Other | Admitting: Internal Medicine

## 2021-04-23 ENCOUNTER — Telehealth: Payer: Self-pay | Admitting: Family Medicine

## 2021-04-23 ENCOUNTER — Encounter: Payer: Self-pay | Admitting: Internal Medicine

## 2021-04-23 ENCOUNTER — Other Ambulatory Visit: Payer: Self-pay

## 2021-04-23 VITALS — BP 150/90 | HR 88 | Ht 72.0 in | Wt 204.4 lb

## 2021-04-23 DIAGNOSIS — I4892 Unspecified atrial flutter: Secondary | ICD-10-CM | POA: Diagnosis not present

## 2021-04-23 NOTE — Progress Notes (Signed)
ELECTROPHYSIOLOGY CONSULT NOTE  Patient ID: Samuel Mayo, MRN: [id number], DOB/AGE: 1945/08/22 76 y.o. Admit date: (Not on file) Date of Consult: 04/23/2021  Primary Physician: Martinique, [name], MD Primary Cardiologist: MBr     Samuel Mayo is a 76 y.o. male who is being seen today for the evaluation of atrial flutter at the request of MBr.    HPI Samuel Mayo is a 76 y.o. male referred for consideration of catheter ablation for atrial flutter.  This was identified serendipitously in December.  The patient denies palpitations exercise intolerance lightheadedness.  No transient neurological events.  He was then seen again in January in cardiac consultation where again atrial flutter-like rhythm was identified.  An echocardiogram was undertaken demonstrating normal LV function and modest left atrial enlargement.  He was started on anticoagulation with Eliquis.  No significant interval bleeding.  Mitral valve repair about 10 years ago.  Thromboembolic risk factors ( age -71, HTN-1) for a CHADSVASc Score of >=3   DATE TEST EF   1//23 Echo   50-55 % LAE        Date Cr K Hgb  12/22 1.09 4.3 13.6         12/22 ECG Atrial flutter typical ACL250 mxec 1/23 ECG Atrial flutter/fibrillation    Past Medical History:  Diagnosis Date   AK (actinic keratosis)    Allergy    Chicken pox    Compression fracture of L1 lumbar vertebra (HCC)    Compression fracture of thoracic vertebra (HCC)    Hypertension    Lentigines    Follows with DR Culton (derma)   Seizures (HCC)       Surgical History:  Past Surgical History:  Procedure Laterality Date   COLONOSCOPY     mitral vavle     Congetinal mitral valve disease.     Home Meds: Current Meds  Medication Sig   amLODipine (NORVASC) 5 MG tablet Take 1 tablet (5 mg total) by mouth daily.   apixaban (ELIQUIS) 5 MG TABS tablet Take 1 tablet (5 mg total) by mouth 2 (two) times daily.   Cholecalciferol (VITAMIN D3) 10000 units TABS  Take 1 tablet by mouth daily.   divalproex (DEPAKOTE) 250 MG DR tablet Take 250 mg by mouth 3 (three) times daily.   gabapentin (NEURONTIN) 300 MG capsule Take 300 mg by mouth daily.   ibandronate (BONIVA) 150 MG tablet TAKE 1 TABLET BY MOUTH EVERY 30 DAYS. TAKE IN AM WITH FULL GLASS OF WATER ON EMPTY STOMACH AND DON'T TAKE ANYTHING ELSE BY MOUTH OR LIE DOWN FOR THE NEXT 30 MINUTES   metoprolol succinate (TOPROL-XL) 25 MG 24 hr tablet Take 1 tablet (25 mg total) by mouth daily.   Multiple Vitamin (MULTIVITAMIN WITH MINERALS) TABS tablet Take 1 tablet by mouth See admin instructions. Take 1 tablet by mouth every 2-3 days   Protein (NUTRA/PRO VANILLA) PACK Take by mouth.   Current Facility-Administered Medications for the 04/23/21 encounter (Office Visit) with Deboraha Sprang, MD  Medication   0.9 %  sodium chloride infusion    Allergies:  Allergies  Allergen Reactions   Other Other (See Comments)    Neosporin Ophthalmic causes eyes to turn red    Social History   Socioeconomic History   Marital status: Widowed    Spouse name: Not on file   Number of children: Not on file   Years of education: Not on file   Highest education level: Not on file  Occupational  History   Not on file  Tobacco Use   Smoking status: Never   Smokeless tobacco: Never  Substance and Sexual Activity   Alcohol use: Yes    Alcohol/week: 1.0 standard drink    Types: 1 Cans of beer per week    Comment: socially   Drug use: No   Sexual activity: Not on file  Other Topics Concern   Not on file  Social History Narrative   Not on file   Social Determinants of Health   Financial Resource Strain: Not on file  Food Insecurity: Not on file  Transportation Needs: Not on file  Physical Activity: Not on file  Stress: Not on file  Social Connections: Not on file  Intimate Partner Violence: Not on file     Family History  Problem Relation Age of Onset   Heart disease Mother    Cancer Father    Heart  disease Father    Prostate cancer Father    Aneurysm Paternal Grandfather    Heart disease Son        congenital mitral valve dz   Colon cancer Neg Hx    Esophageal cancer Neg Hx    Stomach cancer Neg Hx    Rectal cancer Neg Hx      ROS:  Please see the history of present illness.     All other systems reviewed and negative.    Physical Exam:  Blood pressure (!) 150/90, pulse 88, height 6' (1.829 m), weight 204 lb 6.4 oz (92.7 kg), SpO2 97 %. General: Well developed, well nourished male in no acute distress. Head: Normocephalic, atraumatic, sclera non-icteric, no xanthomas, nares are without discharge. EENT: normal  Lymph Nodes:  none Neck: Negative for carotid bruits. JVD not elevated. Back:without scoliosis kyphosis  Lungs: Clear bilaterally to auscultation without wheezes, rales, or rhonchi. Breathing is unlabored. Heart: Irregularly Irregular rate and rhythm with slow controlled  ventricular response with S1 S2. No  murmur . No rubs, or gallops appreciated. Abdomen: Soft, non-tender, non-distended with normoactive bowel sounds. No hepatomegaly. No rebound/guarding. No obvious abdominal masses. Msk:  Strength and tone appear normal for age. Extremities: No clubbing or cyanosis. No  edema.  Distal pedal pulses are 2+ and equal bilaterally. Skin: Warm and Dry Neuro: Alert and oriented X 3. CN III-XII intact Grossly normal sensory and motor function . Psych:  Responds to questions appropriately with a normal affect.        EKG: Atrial flutter-typical with negative flutter waves in lead II and positive flutter waves in lead V1 As noted above 03/19/2021 ECG rhythm strip shows biphasic flutter waves in lead V1 with then negative flutter legs in lead V1   Assessment and Plan:  Atrial flutter-variable (different flutter wave morphologies)  Prior mitral valve surgery  Hypertension   His atrial flutter morphologies are variable.  Hence, cavotricuspid isthmus ablation is not  likely going to have a major beneficial impact for him, the issue of his symptoms is discussed as below the other issue would be anticoagulation.  In the absence of some high likelihood of certainty that the rhythm is cured, anticoagulation would not be appropriately discontinued.  Hence, given the multiple flutters that are evident, we will not pursue catheter ablation at this time  Has atrial flutter with few if any symptoms.  Sometimes the insidious nature of symptoms can make attributable symptoms difficult to discern but in the context of cardioversion and restoration of AV synchrony improvement in symptoms can then be  quite clarifying.  We will undertake cardioversion  If it turns out that his symptoms are significant and improved with cardioversion then consideration could be given to a more extensive ablation procedure with electroanatomical mapping   Anticoagulated with apixaban.  We will continue this at 5 mg twice daily  Blood pressure is poorly controlled; he says this happens commonly here but at home it is much better.  We will continue to follow and hold it for now we will continue him on amlodipine 5 and metoprolol 25.    Virl Axe

## 2021-04-23 NOTE — H&P (View-Only) (Signed)
ELECTROPHYSIOLOGY CONSULT NOTE  Patient ID: Samuel Mayo, MRN: [id number], DOB/AGE: 1946-02-14 76 y.o. Admit date: (Not on file) Date of Consult: 04/23/2021  Primary Physician: [place], [name], MD Primary Cardiologist: MBr     Samuel Mayo is a 76 y.o. male who is being seen today for the evaluation of atrial flutter at the request of MBr.    HPI Samuel Mayo is a 76 y.o. male referred for consideration of catheter ablation for atrial flutter.  This was identified serendipitously in December.  The patient denies palpitations exercise intolerance lightheadedness.  No transient neurological events.  He was then seen again in January in cardiac consultation where again atrial flutter-like rhythm was identified.  An echocardiogram was undertaken demonstrating normal LV function and modest left atrial enlargement.  He was started on anticoagulation with Eliquis.  No significant interval bleeding.  Mitral valve repair about 10 years ago.  Thromboembolic risk factors ( age -20, HTN-1) for a CHADSVASc Score of >=3   DATE TEST EF   1//23 Echo   50-55 % LAE        Date Cr K Hgb  12/22 1.09 4.3 13.6         12/22 ECG Atrial flutter typical ACL250 mxec 1/23 ECG Atrial flutter/fibrillation    Past Medical History:  Diagnosis Date   AK (actinic keratosis)    Allergy    Chicken pox    Compression fracture of L1 lumbar vertebra (HCC)    Compression fracture of thoracic vertebra (HCC)    Hypertension    Lentigines    Follows with DR Culton (derma)   Seizures (HCC)       Surgical History:  Past Surgical History:  Procedure Laterality Date   COLONOSCOPY     mitral vavle     Congetinal mitral valve disease.     Home Meds: Current Meds  Medication Sig   amLODipine (NORVASC) 5 MG tablet Take 1 tablet (5 mg total) by mouth daily.   apixaban (ELIQUIS) 5 MG TABS tablet Take 1 tablet (5 mg total) by mouth 2 (two) times daily.   Cholecalciferol (VITAMIN D3) 10000 units TABS  Take 1 tablet by mouth daily.   divalproex (DEPAKOTE) 250 MG DR tablet Take 250 mg by mouth 3 (three) times daily.   gabapentin (NEURONTIN) 300 MG capsule Take 300 mg by mouth daily.   ibandronate (BONIVA) 150 MG tablet TAKE 1 TABLET BY MOUTH EVERY 30 DAYS. TAKE IN AM WITH FULL GLASS OF WATER ON EMPTY STOMACH AND DON'T TAKE ANYTHING ELSE BY MOUTH OR LIE DOWN FOR THE NEXT 30 MINUTES   metoprolol succinate (TOPROL-XL) 25 MG 24 hr tablet Take 1 tablet (25 mg total) by mouth daily.   Multiple Vitamin (MULTIVITAMIN WITH MINERALS) TABS tablet Take 1 tablet by mouth See admin instructions. Take 1 tablet by mouth every 2-3 days   Protein (NUTRA/PRO VANILLA) PACK Take by mouth.   Current Facility-Administered Medications for the 04/23/21 encounter (Office Visit) with Deboraha Sprang, MD  Medication   0.9 %  sodium chloride infusion    Allergies:  Allergies  Allergen Reactions   Other Other (See Comments)    Neosporin Ophthalmic causes eyes to turn red    Social History   Socioeconomic History   Marital status: Widowed    Spouse name: Not on file   Number of children: Not on file   Years of education: Not on file   Highest education level: Not on file  Occupational  History   Not on file  Tobacco Use   Smoking status: Never   Smokeless tobacco: Never  Substance and Sexual Activity   Alcohol use: Yes    Alcohol/week: 1.0 standard drink    Types: 1 Cans of beer per week    Comment: socially   Drug use: No   Sexual activity: Not on file  Other Topics Concern   Not on file  Social History Narrative   Not on file   Social Determinants of Health   Financial Resource Strain: Not on file  Food Insecurity: Not on file  Transportation Needs: Not on file  Physical Activity: Not on file  Stress: Not on file  Social Connections: Not on file  Intimate Partner Violence: Not on file     Family History  Problem Relation Age of Onset   Heart disease Mother    Cancer Father    Heart  disease Father    Prostate cancer Father    Aneurysm Paternal Grandfather    Heart disease Son        congenital mitral valve dz   Colon cancer Neg Hx    Esophageal cancer Neg Hx    Stomach cancer Neg Hx    Rectal cancer Neg Hx      ROS:  Please see the history of present illness.     All other systems reviewed and negative.    Physical Exam:  Blood pressure (!) 150/90, pulse 88, height 6' (1.829 m), weight 204 lb 6.4 oz (92.7 kg), SpO2 97 %. General: Well developed, well nourished male in no acute distress. Head: Normocephalic, atraumatic, sclera non-icteric, no xanthomas, nares are without discharge. EENT: normal  Lymph Nodes:  none Neck: Negative for carotid bruits. JVD not elevated. Back:without scoliosis kyphosis  Lungs: Clear bilaterally to auscultation without wheezes, rales, or rhonchi. Breathing is unlabored. Heart: Irregularly Irregular rate and rhythm with slow controlled  ventricular response with S1 S2. No  murmur . No rubs, or gallops appreciated. Abdomen: Soft, non-tender, non-distended with normoactive bowel sounds. No hepatomegaly. No rebound/guarding. No obvious abdominal masses. Msk:  Strength and tone appear normal for age. Extremities: No clubbing or cyanosis. No  edema.  Distal pedal pulses are 2+ and equal bilaterally. Skin: Warm and Dry Neuro: Alert and oriented X 3. CN III-XII intact Grossly normal sensory and motor function . Psych:  Responds to questions appropriately with a normal affect.        EKG: Atrial flutter-typical with negative flutter waves in lead II and positive flutter waves in lead V1 As noted above 03/19/2021 ECG rhythm strip shows biphasic flutter waves in lead V1 with then negative flutter legs in lead V1   Assessment and Plan:  Atrial flutter-variable (different flutter wave morphologies)  Prior mitral valve surgery  Hypertension   His atrial flutter morphologies are variable.  Hence, cavotricuspid isthmus ablation is not  likely going to have a major beneficial impact for him, the issue of his symptoms is discussed as below the other issue would be anticoagulation.  In the absence of some high likelihood of certainty that the rhythm is cured, anticoagulation would not be appropriately discontinued.  Hence, given the multiple flutters that are evident, we will not pursue catheter ablation at this time  Has atrial flutter with few if any symptoms.  Sometimes the insidious nature of symptoms can make attributable symptoms difficult to discern but in the context of cardioversion and restoration of AV synchrony improvement in symptoms can then be  quite clarifying.  We will undertake cardioversion  If it turns out that his symptoms are significant and improved with cardioversion then consideration could be given to a more extensive ablation procedure with electroanatomical mapping   Anticoagulated with apixaban.  We will continue this at 5 mg twice daily  Blood pressure is poorly controlled; he says this happens commonly here but at home it is much better.  We will continue to follow and hold it for now we will continue him on amlodipine 5 and metoprolol 25.    Virl Axe

## 2021-04-23 NOTE — Telephone Encounter (Signed)
Spoke with patient to schedule Medicare Annual Wellness Visit (AWV) either virtually or in office.  Pt stated he would call back to schedule    Last AWV 02/02/18 ; please schedule at anytime with [facility] Nurse Health Advisor 1 or 2   This should be a 45 minute visit.

## 2021-04-23 NOTE — Patient Instructions (Addendum)
Medication Instructions:  Your physician recommends that you continue on your current medications as directed. Please refer to the Current Medication list given to you today.  *If you need a refill on your cardiac medications before your next appointment, please call your pharmacy*   Lab Work: None ordered.  If you have labs (blood work) drawn today and your tests are completely normal, you will receive your results only by: [place] (if you have MyChart) OR A paper copy in the mail If you have any lab test that is abnormal or we need to change your treatment, we will call you to review the results.   Testing/Procedures: Your physician has recommended that you have a Cardioversion (DCCV). Electrical Cardioversion uses a jolt of electricity to your heart either through paddles or wired patches attached to your chest. This is a controlled, usually prescheduled, procedure. Defibrillation is done under light anesthesia in the hospital, and you usually go home the day of the procedure. This is done to get your heart back into a normal rhythm. You are not awake for the procedure. Please see the instruction sheet given to you today.     Follow-Up: At [facility], you and your health needs are our priority.  As part of our continuing mission to provide you with exceptional heart care, we have created designated Provider Care Teams.  These Care Teams include your primary Cardiologist (physician) and Advanced Practice Providers (APPs -  Physician Assistants and Nurse Practitioners) who all work together to provide you with the care you need, when you need it.  We recommend signing up for the patient portal called "MyChart".  Sign up information is provided on this After Visit Summary.  MyChart is used to connect with patients for Virtual Visits (Telemedicine).  Patients are able to view lab/test results, encounter notes, upcoming appointments, etc.  Non-urgent messages can be sent to your  provider as well.   To learn more about what you can do with MyChart, go to [email or web address].    Your next appointment:   Follow up with Dr Caryl Comes as needed     You are scheduled for a Cardioversion on Monday,05/14/2021       with Dr. Gardiner Rhyme. Please arrive at the [facility] (Main Entrance A) at [facility]: [place], [place] [address] at 730am   DIET: Nothing to eat or drink after midnight except a sip of water with medications (see medication instructions below)  FYI: For your safety, and to allow [place] to monitor your vital signs accurately during the surgery/procedure we request that   if you have artificial nails, gel coating, SNS etc. Please have those removed prior to your surgery/procedure. Not having the nail coverings /polish removed may result in cancellation or delay of your surgery/procedure.   Medication Instructions: You may take your morning medications with enough water to get them down safely.  Continue your anticoagulant: Eliquis You will need to continue your anticoagulant after your procedure until you  are told by your Provider that it is safe to stop   Labs:You will complete labs at the hospital on the day of your procedure.   You must have a responsible person to drive you home and stay in the waiting area during your procedure. Failure to do so could result in cancellation.  Bring your insurance cards.  *Special Note: Every effort is made to have your procedure done on time. Occasionally there are emergencies that occur at the hospital that may  cause delays. Please be patient if a delay does occur.

## 2021-04-24 ENCOUNTER — Telehealth: Payer: Self-pay

## 2021-04-24 NOTE — Telephone Encounter (Signed)
Attempted phone call and left voicemail message re: need to discuss date and time of DCCV.  Pt advised will attempt call tomorrow 04/25/2021.  Updated DCCV Instructions sent through pt's MyChart.

## 2021-04-25 NOTE — Telephone Encounter (Signed)
Spoke with pt and reviewed updated DCCV instructions.  Pt advised updated instructions sent to his MyChart. Pt verbalizes understanding and thanked Therapist, sports for the phone call.

## 2021-04-26 ENCOUNTER — Other Ambulatory Visit: Payer: Self-pay | Admitting: Family Medicine

## 2021-04-26 DIAGNOSIS — M816 Localized osteoporosis [Lequesne]: Secondary | ICD-10-CM

## 2021-05-04 ENCOUNTER — Encounter (HOSPITAL_COMMUNITY): Payer: Self-pay | Admitting: Cardiology

## 2021-05-13 NOTE — Anesthesia Preprocedure Evaluation (Addendum)
Anesthesia Evaluation  ?Patient identified by MRN, date of birth, ID band ?Patient awake ? ? ? ?Reviewed: ?Allergy & Precautions, NPO status , Patient's Chart, lab work & pertinent test results ? ?History of Anesthesia Complications ?(+) history of anesthetic complications ? ?Airway ?Mallampati: II ? ?TM Distance: >3 FB ?Neck ROM: Full ? ? ? Dental ?no notable dental hx. ?(+) Dental Advisory Given ?  ?Pulmonary ?neg pulmonary ROS,  ?  ?Pulmonary exam normal ? ? ? ? ? ? ? Cardiovascular ?hypertension, Pt. on home beta blockers ?+ dysrhythmias Atrial Fibrillation  ?Rhythm:Irregular Rate:Tachycardia ? ?IMPRESSIONS  ? ? ??1. There is a prosthetic annuloplasty ring present in the mitral  ?position. Procedure Date: 2003. Location: [place], [place]  ??2. Right ventricular systolic function is low normal. The right  ?ventricular size is moderately enlarged. There is normal pulmonary artery  ?systolic pressure.  ??3. Left ventricular ejection fraction, by estimation, is 50 to 55%. The  ?left ventricle has low normal function. The left ventricle has no regional  ?wall motion abnormalities. Left ventricular diastolic function could not  ?be evaluated.  ??4. Aortic dilatation noted. There is mild dilatation of the ascending  ?aorta, measuring 41 mm.  ??5. The aortic valve is tricuspid. There is mild calcification of the  ?aortic valve. There is mild thickening of the aortic valve. Aortic valve  ?regurgitation is not visualized. Aortic valve sclerosis is present, with  ?no evidence of aortic valve stenosis.  ??6. Tricuspid valve regurgitation is moderate.  ??7. Left atrial size was mild to moderately dilated.  ??8. Right atrial size was severely dilated.  ??9. The inferior vena cava is dilated in size with <50% respiratory  ?variability, suggesting right atrial pressure of 15 mmHg.  ?  ?Neuro/Psych ?Seizures -,    ? GI/Hepatic ?negative GI ROS, Neg liver ROS,   ?Endo/Other  ?negative endocrine ROS ?  Renal/GU ?negative Renal ROS  ? ?  ?Musculoskeletal ?negative musculoskeletal ROS ?(+)  ? Abdominal ?  ?Peds ? Hematology ?negative hematology ROS ?(+)   ?Anesthesia Other Findings ? ? Reproductive/Obstetrics ? ?  ? ? ? ? ? ? ? ? ? ? ? ? ? ?  ?  ? ? ? ? ? ? ? ?Anesthesia Physical ?Anesthesia Plan ? ?ASA: 3 ? ?Anesthesia Plan: General  ? ?Post-op Pain Management: Minimal or no pain anticipated  ? ?Induction: Intravenous ? ?PONV Risk Score and Plan: Propofol infusion and TIVA ? ?Airway Management Planned: Mask ? ?Additional Equipment:  ? ?Intra-op Plan:  ? ?Post-operative Plan:  ? ?Informed Consent: I have reviewed the patients History and Physical, chart, labs and discussed the procedure including the risks, benefits and alternatives for the proposed anesthesia with the patient or authorized representative who has indicated his/her understanding and acceptance.  ? ? ? ?Dental advisory given ? ?Plan Discussed with: Anesthesiologist and CRNA ? ?Anesthesia Plan Comments:   ? ? ? ? ? ?Anesthesia Quick Evaluation ? ?

## 2021-05-14 ENCOUNTER — Ambulatory Visit (HOSPITAL_COMMUNITY)
Admission: RE | Admit: 2021-05-14 | Discharge: 2021-05-14 | Disposition: A | Discharge status: Home / Self Care | Payer: Medicare Other | Source: Ambulatory Visit | Attending: Cardiology | Admitting: Cardiology

## 2021-05-14 ENCOUNTER — Other Ambulatory Visit: Payer: Self-pay

## 2021-05-14 ENCOUNTER — Ambulatory Visit (HOSPITAL_COMMUNITY): Payer: Medicare Other | Admitting: Anesthesiology

## 2021-05-14 ENCOUNTER — Ambulatory Visit (HOSPITAL_BASED_OUTPATIENT_CLINIC_OR_DEPARTMENT_OTHER): Payer: Medicare Other | Admitting: Anesthesiology

## 2021-05-14 ENCOUNTER — Encounter (HOSPITAL_COMMUNITY)
Admission: RE | Disposition: A | Discharge status: Home / Self Care | Payer: Self-pay | Source: Ambulatory Visit | Attending: Cardiology

## 2021-05-14 DIAGNOSIS — I4892 Unspecified atrial flutter: Secondary | ICD-10-CM

## 2021-05-14 DIAGNOSIS — Z7901 Long term (current) use of anticoagulants: Secondary | ICD-10-CM | POA: Insufficient documentation

## 2021-05-14 DIAGNOSIS — Z79899 Other long term (current) drug therapy: Secondary | ICD-10-CM | POA: Insufficient documentation

## 2021-05-14 DIAGNOSIS — I119 Hypertensive heart disease without heart failure: Secondary | ICD-10-CM | POA: Insufficient documentation

## 2021-05-14 DIAGNOSIS — G40909 Epilepsy, unspecified, not intractable, without status epilepticus: Secondary | ICD-10-CM | POA: Insufficient documentation

## 2021-05-14 DIAGNOSIS — I10 Essential (primary) hypertension: Secondary | ICD-10-CM

## 2021-05-14 DIAGNOSIS — R569 Unspecified convulsions: Secondary | ICD-10-CM | POA: Diagnosis not present

## 2021-05-14 DIAGNOSIS — I4891 Unspecified atrial fibrillation: Secondary | ICD-10-CM | POA: Diagnosis not present

## 2021-05-14 DIAGNOSIS — I071 Rheumatic tricuspid insufficiency: Secondary | ICD-10-CM | POA: Diagnosis not present

## 2021-05-14 SURGERY — CARDIOVERSION
Anesthesia: General

## 2021-05-14 MED ORDER — SODIUM CHLORIDE 0.9 % IV SOLN: INTRAVENOUS | Status: DC

## 2021-05-14 MED ORDER — PROPOFOL 10 MG/ML IV BOLUS
INTRAVENOUS | Status: DC | PRN
  Administered 2021-05-14: 70 mg via INTRAVENOUS

## 2021-05-14 MED ORDER — LIDOCAINE 2% (20 MG/ML) 5 ML SYRINGE
INTRAMUSCULAR | Status: DC | PRN
  Administered 2021-05-14: 100 mg via INTRAVENOUS

## 2021-05-14 NOTE — Transfer of Care (Signed)
Immediate Anesthesia Transfer of Care Note ? ?Patient: Samuel Mayo ? ?Procedure(s) Performed: CARDIOVERSION ? ?Patient Location: Endoscopy Unit ? ?Anesthesia Type:General ? ?Level of Consciousness: awake, alert  and oriented ? ?Airway & Oxygen Therapy: Patient Spontanous Breathing ? ?Post-op Assessment: Report given to RN and Post -op Vital signs reviewed and stable ? ?Post vital signs: Reviewed and stable ? ?Last Vitals:  ?Vitals Value Taken Time  ?BP 134/82   ?Temp    ?Pulse 80   ?Resp 16   ?SpO2 96   ? ? ?Last Pain:  ?Vitals:  ? 05/14/21 0746  ?TempSrc: Temporal  ?PainSc: 0-No pain  ?   ? ?  ? ?Complications: No notable events documented. ?

## 2021-05-14 NOTE — CV Procedure (Signed)
Procedure:   DCCV ? ?Indication:  Symptomatic atrial flutter ? ?Procedure Note:  The patient signed informed consent.  They have had had therapeutic anticoagulation with Eliquis greater than 3 weeks.  Anesthesia was administered by Dr. Lissa Hoard and Viann Fish, CRNA.  Adequate airway was maintained throughout and vital followed per protocol.  They were cardioverted x 1 with 100J of biphasic synchronized energy.  They converted to NSR with rate 70-80s.  There were no apparent complications.  The patient had normal neuro status and respiratory status post procedure with vitals stable as recorded elsewhere.   ? ?Follow up:  They will continue on current medical therapy and follow up with cardiology as scheduled. ? ?Oswaldo Milian, MD ?05/14/2021 ?9:19 AM  ? ?

## 2021-05-14 NOTE — Interval H&P Note (Signed)
History and Physical Interval Note: ? ?05/14/2021 ?8:52 AM ? ?Samuel Mayo  has presented today for surgery, with the diagnosis of AFLUTTER.  The various methods of treatment have been discussed with the patient and family. After consideration of risks, benefits and other options for treatment, the patient has consented to  Procedure(s): ?CARDIOVERSION (N/A) as a surgical intervention.  The patient's history has been reviewed, patient examined, no change in status, stable for surgery.  I have reviewed the patient's chart and labs.  Questions were answered to the patient's satisfaction.   ? ? ?Donato Heinz ? ? ?

## 2021-05-14 NOTE — Discharge Instructions (Signed)
Electrical Cardioversion  Electrical cardioversion is the delivery of a jolt of electricity to restore a normal rhythm to the heart. A rhythm that is too fast or is not regular keeps the heart from pumping well. In this procedure, sticky patches or metal paddles are placed on the chest to deliver electricity to the heart from a device.  If this information does not answer your questions, please call [facility] office at [phone] to clarify.   Follow these instructions at home: You may have some redness on the skin where the shocks were given.  You may apply over-the-counter hydrocortisone cream or aloe vera to alleviate skin irritation. YOU SHOULD NOT DRIVE, use power tools, machinery or perform tasks that involve climbing or major physical exertion for 24 hours (because of the sedation medicines used during the test).  Take over-the-counter and prescription medicines only as told by your health care provider. Ask your health care provider how to check your pulse. Check it often. Rest for 48 hours after the procedure or as told by your health care provider. Avoid or limit your caffeine use as told by your health care provider. Keep all follow-up visits as told by your health care provider. This is important.  FOLLOW UP:  Please also call with any specific questions about appointments or follow up tests. Electrical Cardioversion  Electrical cardioversion is the delivery of a jolt of electricity to restore a normal rhythm to the heart. A rhythm that is too fast or is not regular keeps the heart from pumping well. In this procedure, sticky patches or metal paddles are placed on the chest to deliver electricity to the heart from a device.  If this information does not answer your questions, please call [facility] office at [phone] to clarify.   Follow these instructions at home: You may have some redness on the skin where the shocks were  given.  You may apply over-the-counter hydrocortisone cream or aloe vera to alleviate skin irritation. YOU SHOULD NOT DRIVE, use power tools, machinery or perform tasks that involve climbing or major physical exertion for 24 hours (because of the sedation medicines used during the test).  Take over-the-counter and prescription medicines only as told by your health care provider. Ask your health care provider how to check your pulse. Check it often. Rest for 48 hours after the procedure or as told by your health care provider. Avoid or limit your caffeine use as told by your health care provider. Keep all follow-up visits as told by your health care provider. This is important.  FOLLOW UP:  Please also call with any specific questions about appointments or follow up tests.  

## 2021-05-14 NOTE — Anesthesia Procedure Notes (Signed)
Procedure Name: General with mask airway ?Date/Time: 05/14/2021 9:00 AM ?Performed by: Griffin Dakin, CRNA ?Pre-anesthesia Checklist: Patient identified, Emergency Drugs available, Suction available, Patient being monitored and Timeout performed ?Patient Re-evaluated:Patient Re-evaluated prior to induction ?Oxygen Delivery Method: Ambu bag ?Preoxygenation: Pre-oxygenation with 100% oxygen ?Induction Type: IV induction ?Ventilation: Mask ventilation without difficulty ?Placement Confirmation: positive ETCO2 and breath sounds checked- equal and bilateral ?Dental Injury: Teeth and Oropharynx as per pre-operative assessment  ? ? ? ? ?

## 2021-05-15 ENCOUNTER — Encounter (HOSPITAL_COMMUNITY): Payer: Self-pay | Admitting: Cardiology

## 2021-05-15 NOTE — Anesthesia Postprocedure Evaluation (Signed)
Anesthesia Post Note ? ?Patient: Samuel Mayo ? ?Procedure(s) Performed: CARDIOVERSION ? ?  ? ?Patient location during evaluation: Endoscopy ?Anesthesia Type: General ?Level of consciousness: sedated and patient cooperative ?Pain management: pain level controlled ?Vital Signs Assessment: post-procedure vital signs reviewed and stable ?Respiratory status: spontaneous breathing ?Cardiovascular status: stable ?Anesthetic complications: no ? ? ?No notable events documented. ? ?Last Vitals:  ?Vitals:  ? 05/14/21 0920 05/14/21 0930  ?BP: (!) 142/84 132/78  ?Pulse: 78 78  ?Resp: 15 15  ?Temp:    ?SpO2: 98% 98%  ?  ?Last Pain:  ?Vitals:  ? 05/14/21 0930  ?TempSrc:   ?PainSc: 0-No pain  ? ? ?  ?  ?  ?  ?  ?  ? ?Nolon Nations ? ? ? ? ?

## 2021-05-16 DIAGNOSIS — R569 Unspecified convulsions: Secondary | ICD-10-CM | POA: Diagnosis not present

## 2021-05-16 DIAGNOSIS — G2581 Restless legs syndrome: Secondary | ICD-10-CM | POA: Diagnosis not present

## 2021-05-16 DIAGNOSIS — G479 Sleep disorder, unspecified: Secondary | ICD-10-CM | POA: Diagnosis not present

## 2021-05-17 ENCOUNTER — Telehealth: Payer: Self-pay

## 2021-05-17 DIAGNOSIS — R569 Unspecified convulsions: Secondary | ICD-10-CM

## 2021-05-17 DIAGNOSIS — G40209 Localization-related (focal) (partial) symptomatic epilepsy and epileptic syndromes with complex partial seizures, not intractable, without status epilepticus: Secondary | ICD-10-CM

## 2021-05-17 NOTE — Telephone Encounter (Signed)
Patient called asking for referral to  neurologist  ? ?

## 2021-05-18 NOTE — Addendum Note (Signed)
Addended by: Rodrigo Ran on: 05/18/2021 06:53 AM ? ? Modules accepted: Orders ? ?

## 2021-05-18 NOTE — Telephone Encounter (Signed)
Referral entered  

## 2021-05-21 ENCOUNTER — Ambulatory Visit (INDEPENDENT_AMBULATORY_CARE_PROVIDER_SITE_OTHER): Payer: Medicare Other | Admitting: Family Medicine

## 2021-05-21 ENCOUNTER — Encounter: Payer: Self-pay | Admitting: Family Medicine

## 2021-05-21 VITALS — BP 124/82 | HR 68 | Temp 98.0°F | Wt 200.0 lb

## 2021-05-21 DIAGNOSIS — R197 Diarrhea, unspecified: Secondary | ICD-10-CM | POA: Diagnosis not present

## 2021-05-21 MED ORDER — DIPHENOXYLATE-ATROPINE 2.5-0.025 MG PO TABS: 2.0000 | ORAL_TABLET | Freq: Four times a day (QID) | ORAL | 0 refills | Status: DC | PRN

## 2021-05-21 NOTE — Progress Notes (Signed)
? ?  Subjective:  ? ? Patient ID: Samuel Mayo, male    DOB: Jan 24, 1946, 76 y.o.   MRN: [id number] ? ?HPI ?Here for 6 days of diarrhea that started suddenly. He has no other symptoms and he feels fine in general. No fever or cramps, or abdominal pain. No nausea or vomiting. His appetite is normal. No recent antibiotic use or travel. No recent medication changes. This started the day after he had a successful cardioversion. His partner has been doing well. He is using Imodium with poor results.  ? ? ?Review of Systems  ?Constitutional: Negative.   ?Respiratory: Negative.    ?Cardiovascular: Negative.   ?Gastrointestinal:  Positive for diarrhea. Negative for abdominal distention, abdominal pain, blood in stool, constipation, nausea and vomiting.  ?Genitourinary: Negative.   ? ?   ?Objective:  ? Physical Exam ?Constitutional:   ?   Appearance: Normal appearance. He is not ill-appearing.  ?Eyes:  ?   General: No scleral icterus. ?Cardiovascular:  ?   Rate and Rhythm: Normal rate and regular rhythm.  ?   Pulses: Normal pulses.  ?   Heart sounds: Normal heart sounds.  ?Pulmonary:  ?   Effort: Pulmonary effort is normal.  ?   Breath sounds: Normal breath sounds.  ?Abdominal:  ?   General: Abdomen is flat. Bowel sounds are normal. There is no distension.  ?   Palpations: Abdomen is soft. There is no mass.  ?   Tenderness: There is no abdominal tenderness. There is no guarding or rebound.  ?   Hernia: No hernia is present.  ?Neurological:  ?   Mental Status: He is alert.  ? ? ? ? ? ?   ?Assessment & Plan:  ?Diarrhea, likely viral. He will try Lomotil every 6 hours as needed. Drink plenty of fluids. He will follow up with [place] if he is not better in 2 days. ?Alysia Penna, MD ? ? ?

## 2021-06-05 ENCOUNTER — Ambulatory Visit: Payer: Medicare Other | Admitting: Internal Medicine

## 2021-06-05 ENCOUNTER — Encounter: Payer: Self-pay | Admitting: Internal Medicine

## 2021-06-05 VITALS — BP 148/96 | HR 88 | Ht 72.0 in | Wt 198.8 lb

## 2021-06-05 DIAGNOSIS — I4892 Unspecified atrial flutter: Secondary | ICD-10-CM | POA: Diagnosis not present

## 2021-06-05 NOTE — Progress Notes (Signed)
?Cardiology Office Note:   ? ?Date:  06/05/2021  ? ?IDEdger Mayo, DOB 1945/06/26, MRN [id number] ? ?PCP:  Martinique, [name], MD ?  ?[facility] Providers ?Cardiologist:  Janina Mayo, MD    ? ?Referring MD: Martinique, [name], MD  ? ?No chief complaint on file. ?Atrial Flutter ? ?History of Present Illness:   ? ?Samuel Mayo is a 76 y.o. male with a hx of  HTN, seizures, hx of mitral valve prosthetic annuloplasty ring , referral for atrial flutter ? ?He had mitral valve surgery 20 years ago in [place], [place]. He said he had no problems afterwards and followed with cardiology. For ?mitral regurgitation. Not rheumatic heart disease. Unknown etiology. He moved here to [facility].  He was diagnosed with atrial flutter with his PCP at the end of December. ? ?He denies hx of  stress test or LHC. LDL 74 mg/dL. He's never been on metoprolol. He prefers less medications. He has no bleeding history. He is asymptomatic.  ? ?Stroke- No ?Sleep apnea hx- No ?TSH- 2.4  ?Blood pressure mildly elevated in the system 140s-150s SBP; at home in the 130s. ? ?EKG 02/21/2021- atrial flutter with variable A-V block ? ?Interim Hx ?Saw EP. Flutter morphologies variable and CTI was not felt to be helpful. Scheduled DCCV which he completed 3/13. He had restoration of sinus rhythm.  Typically BPs are in good control in the ambulatory setting, he showed me on his phone. ? ?Echo: Low normal LV/RV fxn. Prosthetic annuloplasty ring present in the mitral  position. Procedure Date: 2003. Mild mitral valve stenosis. MV peak gradient, 12.5 mmHg. The mean mitral valve gradient is 5.7 mmHg with average heart rate of 77 bpm. Mild dilation of the ascending aorta 41 mm ? ?Past Medical History:  ?Diagnosis Date  ? AK (actinic keratosis)   ? Allergy   ? Chicken pox   ? Compression fracture of L1 lumbar vertebra (HCC)   ? Compression fracture of thoracic vertebra (HCC)   ? Hypertension   ? Lentigines   ? Follows with DR Culton (derma)  ?  Seizures ([place])   ? ? ?Past Surgical History:  ?Procedure Laterality Date  ? CARDIOVERSION N/A 05/14/2021  ? Procedure: CARDIOVERSION;  Surgeon: Donato Heinz, MD;  Location: [facility] ENDOSCOPY;  Service: Cardiovascular;  Laterality: N/A;  ? COLONOSCOPY    ? mitral vavle    ? Congetinal mitral valve disease.  ? ? ?Current Medications: ?Current Meds  ?Medication Sig  ? amLODipine (NORVASC) 5 MG tablet Take 1 tablet (5 mg total) by mouth daily.  ? apixaban (ELIQUIS) 5 MG TABS tablet Take 1 tablet (5 mg total) by mouth 2 (two) times daily.  ? beta carotene w/minerals (OCUVITE) tablet Take 1 tablet by mouth daily.  ? Cholecalciferol (VITAMIN D3) 10000 units TABS Take 1 tablet by mouth every evening.  ? divalproex (DEPAKOTE) 250 MG DR tablet Take 250 mg by mouth 3 (three) times daily.  ? gabapentin (NEURONTIN) 300 MG capsule Take 300 mg by mouth at bedtime.  ? ibandronate (BONIVA) 150 MG tablet TAKE 1 TABLET BY MOUTH EVERY 30 DAYS. TAKE IN AM WITH FULL GLASS OF WATER ON EMPTY STOMACH AND DON'T TAKE ANYTHING ELSE BY MOUTH OR LIE DOWN FOR THE NEXT 30 MINUTES  ? metoprolol succinate (TOPROL-XL) 25 MG 24 hr tablet Take 1 tablet (25 mg total) by mouth daily. (Patient taking differently: Take 25 mg by mouth daily after supper.)  ? Multiple Vitamin (CALCIUM COMPLEX PO) Take  1 tablet by mouth in the morning, at noon, and at bedtime. Bone Up [place] Formulas  ? Multiple Vitamin (MULTIVITAMIN WITH MINERALS) TABS tablet Take 1 tablet by mouth in the morning, at noon, and at bedtime. Vitamin Code Whole Food Multivitamin  ? risperiDONE (RISPERDAL) 0.25 MG tablet Take 0.25 mg by mouth at bedtime.  ? ?Current Facility-Administered Medications for the 06/05/21 encounter (Office Visit) with Janina Mayo, MD  ?Medication  ? 0.9 %  sodium chloride infusion  ?  ? ?Allergies:   Other  ? ?Social History  ? ?Socioeconomic History  ? Marital status: Widowed  ?  Spouse name: Not on file  ? Number of children: Not on file  ? Years of education:  Not on file  ? Highest education level: Not on file  ?Occupational History  ? Not on file  ?Tobacco Use  ? Smoking status: Never  ? Smokeless tobacco: Never  ?Substance and Sexual Activity  ? Alcohol use: Yes  ?  Alcohol/week: 1.0 standard drink  ?  Types: 1 Cans of beer per week  ?  Comment: socially  ? Drug use: No  ? Sexual activity: Not on file  ?Other Topics Concern  ? Not on file  ?Social History Narrative  ? Not on file  ? ?Social Determinants of Health  ? ?Financial Resource Strain: Not on file  ?Food Insecurity: Not on file  ?Transportation Needs: Not on file  ?Physical Activity: Not on file  ?Stress: Not on file  ?Social Connections: Not on file  ?  ? ?Family History: ?The patient's family history includes Aneurysm in his paternal grandfather; Cancer in his father; Heart disease in his father, mother, and son; Prostate cancer in his father. There is no history of Colon cancer, Esophageal cancer, Stomach cancer, or Rectal cancer. ? ?ROS:   ?Please see the history of present illness.    ? All other systems reviewed and are negative. ? ?EKGs/Labs/Other Studies Reviewed:   ? ?The following studies were reviewed today: ? ? ?EKG:  EKG is  ordered today.  The ekg ordered today demonstrates  ? ?EKG 03/19/2021-Atrial flutter with variable block ?Flutter waves read as STE ? ?EKG 06/05/2021: Sinus rhythm with 1st degree AV block, LAD ? ?Recent Labs: ?02/21/2021: BUN 24; Creatinine, Ser 1.09; Hemoglobin 13.6; Platelets 197.0; Potassium 4.3; Sodium 137; TSH 2.47  ?Recent Lipid Panel ?   ?Component Value Date/Time  ? CHOL 163 02/16/2019 1051  ? TRIG 48.0 02/16/2019 1051  ? HDL 79.10 02/16/2019 1051  ? CHOLHDL 2 02/16/2019 1051  ? VLDL 9.6 02/16/2019 1051  ? [place] 74 02/16/2019 1051  ? ? ? ?Risk Assessment/Calculations:   ? ?CHA2DS2-VASc Score = 3  ? This indicates a 3.2% annual risk of stroke. ?The patient's score is based upon: ?CHF History: 0 ?HTN History: 1 ?Diabetes History: 0 ?Stroke History: 0 ?Vascular Disease  History: 0 ?Age Score: 2 ?Gender Score: 0 ?  ? ? ?    ? ?Physical Exam:   ? ?VS:   ? ?Vitals:  ? 06/05/21 1015  ?BP: (!) 148/96  ?Pulse: 88  ?SpO2: 99%  ? ? ? ?Wt Readings from Last 3 Encounters:  ?06/05/21 198 lb 12.8 oz (90.2 kg)  ?05/21/21 200 lb (90.7 kg)  ?05/14/21 200 lb (90.7 kg)  ?  ? ?GEN:  Well nourished, well developed in no acute distress ?HEENT: Normal ?NECK: No JVD; No carotid bruits ?LYMPHATICS: No lymphadenopathy ?CARDIAC: RRR, no murmurs, rubs, gallops ?RESPIRATORY:  Clear to auscultation  without rales, wheezing or rhonchi  ?ABDOMEN: Soft, non-tender, non-distended ?MUSCULOSKELETAL:  No edema; No deformity  ?SKIN: Warm and dry ?NEUROLOGIC:  Alert and oriented x 3 ?PSYCHIATRIC:  Normal affect  ? ?ASSESSMENT:   ? ?#Typical/Atypical Atrial flutter: Chads2Vasc 3. He presents today with flutter with variable block. He is asymptomatic. He was seen by EP, with variable flutter morphologies, CTI would not be beneficial. Underwent DCCV with restoration of sinus rhythm. Contingency plan will be to consider dofetilide/sotalol  (Qtc 462 ms) load if not maintaining sinus rhythm. We discussed this. Would send back to Dr. Caryl Comes; otherwise can follow up here. He was apprehensive about going to Afib clinic since he already had follow up here, which is very reasonable. Can reschedule if need be in the future. ?-continue  eliquis 5 mg BID ?-continue metoprolol XL 25 mg daily  ? ?#Mitral Valve Annuloplasty Ring: no significant gradient only trivial regurgitation. Will continue to monitor. ? ?#HTN: ambulatory pressures well controlled. ? ?PLAN:   ? ?In order of problems listed above: ? ?Follow up in 6 months ? ?   ? ?  ?Medication Adjustments/Labs and Tests Ordered: ?Current medicines are reviewed at length with the patient today.  Concerns regarding medicines are outlined above.  ?Orders Placed This Encounter  ?Procedures  ? EKG 12-Lead  ? ?No orders of the defined types were placed in this encounter. ? ? ?Patient  Instructions  ?Medication Instructions:  ?No Changes In Medications at this time.  ?*If you need a refill on your cardiac medications before your next appointment, please call your pharmacy* ? ?Follow-Up: ?At [facility]

## 2021-06-05 NOTE — Patient Instructions (Signed)
Medication Instructions:  ?No Changes In Medications at this time.  ?*If you need a refill on your cardiac medications before your next appointment, please call your pharmacy* ? ?Follow-Up: ?At [facility], you and your health needs are our priority.  As part of our continuing mission to provide you with exceptional heart care, we have created designated Provider Care Teams.  These Care Teams include your primary Cardiologist (physician) and Advanced Practice Providers (APPs -  Physician Assistants and Nurse Practitioners) who all work together to provide you with the care you need, when you need it. ? ?Your next appointment:   ?6 month(s) ? ?The format for your next appointment:   ?In Person ? ?Provider:   ?Janina Mayo, MD   ?

## 2021-06-12 ENCOUNTER — Ambulatory Visit (HOSPITAL_COMMUNITY): Payer: Medicare Other | Admitting: Physician Assistant

## 2021-06-21 ENCOUNTER — Ambulatory Visit (INDEPENDENT_AMBULATORY_CARE_PROVIDER_SITE_OTHER): Payer: Medicare Other

## 2021-06-21 VITALS — BP 122/62 | HR 79 | Temp 99.0°F | Ht 72.0 in | Wt 195.3 lb

## 2021-06-21 DIAGNOSIS — Z0000 Encounter for general adult medical examination without abnormal findings: Secondary | ICD-10-CM | POA: Diagnosis not present

## 2021-06-21 NOTE — Progress Notes (Signed)
? ?Subjective:  ? Samuel Mayo is a 76 y.o. male who presents for Medicare Annual/Subsequent preventive examination. ? ?Review of Systems    ?   ?Objective:  ?  ?Today's Vitals  ? 06/21/21 0958  ?BP: 122/62  ?Pulse: 79  ?Temp: 99 ?F (37.2 ?C)  ?TempSrc: Oral  ?SpO2: 99%  ?Weight: 195 lb 4.8 oz (88.6 kg)  ?Height: 6' (1.829 m)  ? ?Body mass index is 26.49 kg/m?. ? ? ?  06/21/2021  ? 10:11 AM 05/14/2021  ?  7:41 AM 10/07/2019  ?  1:03 PM 06/12/2017  ? 12:33 PM  ?Advanced Directives  ?Does Patient Have a Medical Advance Directive? Yes Yes No Yes  ?Type of Paramedic of [place];Living will Healthcare Power of [place]  ?Does patient want to make changes to medical advance directive? No - Patient declined     ?Copy of [place] in Chart? No - copy requested     ? ? ?Current Medications (verified) ?Outpatient Encounter Medications as of 06/21/2021  ?Medication Sig  ? amLODipine (NORVASC) 5 MG tablet Take 1 tablet (5 mg total) by mouth daily.  ? apixaban (ELIQUIS) 5 MG TABS tablet Take 1 tablet (5 mg total) by mouth 2 (two) times daily.  ? beta carotene w/minerals (OCUVITE) tablet Take 1 tablet by mouth daily.  ? Cholecalciferol (VITAMIN D3) 10000 units TABS Take 1 tablet by mouth every evening.  ? divalproex (DEPAKOTE) 250 MG DR tablet Take 250 mg by mouth 3 (three) times daily.  ? gabapentin (NEURONTIN) 300 MG capsule Take 300 mg by mouth at bedtime.  ? ibandronate (BONIVA) 150 MG tablet TAKE 1 TABLET BY MOUTH EVERY 30 DAYS. TAKE IN AM WITH FULL GLASS OF WATER ON EMPTY STOMACH AND DON'T TAKE ANYTHING ELSE BY MOUTH OR LIE DOWN FOR THE NEXT 30 MINUTES  ? metoprolol succinate (TOPROL-XL) 25 MG 24 hr tablet Take 1 tablet (25 mg total) by mouth daily. (Patient taking differently: Take 25 mg by mouth daily after supper.)  ? Multiple Vitamin (CALCIUM COMPLEX PO) Take 1 tablet by mouth in the morning, at noon, and at bedtime. Bone Up [place] Formulas  ?  Multiple Vitamin (MULTIVITAMIN WITH MINERALS) TABS tablet Take 1 tablet by mouth in the morning, at noon, and at bedtime. Vitamin Code Whole Food Multivitamin  ? risperiDONE (RISPERDAL) 0.25 MG tablet Take 0.25 mg by mouth at bedtime.  ? ?Facility-Administered Encounter Medications as of 06/21/2021  ?Medication  ? 0.9 %  sodium chloride infusion  ? ? ?Allergies (verified) ?Other  ? ?History: ?Past Medical History:  ?Diagnosis Date  ? AK (actinic keratosis)   ? Allergy   ? Chicken pox   ? Compression fracture of L1 lumbar vertebra (HCC)   ? Compression fracture of thoracic vertebra (HCC)   ? Hypertension   ? Lentigines   ? Follows with DR Culton (derma)  ? Seizures ([place])   ? ?Past Surgical History:  ?Procedure Laterality Date  ? CARDIOVERSION N/A 05/14/2021  ? Procedure: CARDIOVERSION;  Surgeon: Donato Heinz, MD;  Location: [facility] ENDOSCOPY;  Service: Cardiovascular;  Laterality: N/A;  ? COLONOSCOPY    ? mitral vavle    ? Congetinal mitral valve disease.  ? ?Family History  ?Problem Relation Age of Onset  ? Heart disease Mother   ? Cancer Father   ? Heart disease Father   ? Prostate cancer Father   ? Aneurysm Paternal Grandfather   ? Heart disease Son   ?  congenital mitral valve dz  ? Colon cancer Neg Hx   ? Esophageal cancer Neg Hx   ? Stomach cancer Neg Hx   ? Rectal cancer Neg Hx   ? ?Social History  ? ?Socioeconomic History  ? Marital status: Widowed  ?  Spouse name: Not on file  ? Number of children: Not on file  ? Years of education: Not on file  ? Highest education level: Not on file  ?Occupational History  ? Not on file  ?Tobacco Use  ? Smoking status: Never  ? Smokeless tobacco: Never  ?Substance and Sexual Activity  ? Alcohol use: Yes  ?  Alcohol/week: 1.0 standard drink  ?  Types: 1 Cans of beer per week  ?  Comment: socially  ? Drug use: No  ? Sexual activity: Not on file  ?Other Topics Concern  ? Not on file  ?Social History Narrative  ? Not on file  ? ?Social Determinants of Health   ? ?Financial Resource Strain: Low Risk   ? Difficulty of Paying Living Expenses: Not hard at all  ?Food Insecurity: No Food Insecurity  ? Worried About Charity fundraiser in the Last Year: Never true  ? Ran Out of Food in the Last Year: Never true  ?Transportation Needs: No Transportation Needs  ? Lack of Transportation (Medical): No  ? Lack of Transportation (Non-Medical): No  ?Physical Activity: Sufficiently Active  ? Days of Exercise per Week: 4 days  ? Minutes of Exercise per Session: 60 min  ?Stress: No Stress Concern Present  ? Feeling of Stress : Only a little  ?Social Connections: Moderately Isolated  ? Frequency of Communication with Friends and Family: Once a week  ? Frequency of Social Gatherings with Friends and Family: Once a week  ? Attends Religious Services: Never  ? Active Member of Clubs or Organizations: Yes  ? Attends Archivist Meetings: More than 4 times per year  ? Marital Status: Living with partner  ? ? ? ?Clinical Intake: ? ?Pre-visit preparation completed: Yes ?How often do you need to have someone help you when you read instructions, pamphlets, or other written materials from your doctor or pharmacy?: 1 - Never ? ?Diabetic?  No ? ?Interpreter Needed?: No ?Activities of Daily Living ? ?  06/21/2021  ? 10:09 AM 06/20/2021  ?  4:52 PM  ?In your present state of health, do you have any difficulty performing the following activities:  ?Hearing? 0 0  ?Vision? 0 0  ?Difficulty concentrating or making decisions? 0 0  ?Walking or climbing stairs? 0 0  ?Dressing or bathing? 0 0  ?Doing errands, shopping? 0 0  ?Preparing Food and eating ? N N  ?Using the Toilet? N N  ?In the past six months, have you accidently leaked urine? N N  ?Do you have problems with loss of bowel control? N N  ?Managing your Medications? N N  ?Managing your Finances? N N  ?Housekeeping or managing your Housekeeping? N N  ? ? ?Patient Care Team: ?Martinique, [name], MD as PCP - General (Family Medicine) ?Janina Mayo,  MD as PCP - Cardiology (Cardiology) ?Anabel Bene, MD as Referring Physician (Neurology) ? ?Indicate any recent Medical Services you may have received from other than [facility] providers in the past year (date may be approximate). ? ?   ?Assessment:  ? This is a routine wellness examination for [facility]. ? ?Hearing/Vision screen ?Hearing Screening - Comments:: No hearing difficulty ?Vision Screening -  Comments:: Wears glasses. Followed by Dr Mariea Clonts ? ?Dietary issues and exercise activities discussed: ?Exercise limited by: None identified ? ? Goals Addressed   ? ?  ?  ?  ?  ?  ? This Visit's Progress  ?   Maintain current lifestyle (pt-stated)     ?   Travel More. ?  ? ?  ? ?Depression Screen ? ?  06/21/2021  ? 10:06 AM 05/21/2021  ? 11:32 AM 02/21/2021  ?  9:32 AM 02/18/2020  ?  2:11 PM 02/18/2019  ?  8:38 PM 02/07/2018  ?  8:28 PM 01/31/2017  ?  8:19 AM  ?PHQ 2/9 Scores  ?PHQ - 2 Score 0 0 0 0 0 0 0  ?PHQ- 9 Score 0 0       ?  ?Fall Risk ? ?  06/21/2021  ? 10:09 AM 06/20/2021  ?  4:52 PM 05/21/2021  ? 11:32 AM 02/21/2021  ?  9:32 AM 02/18/2020  ?  2:10 PM  ?Fall Risk   ?Falls in the past year? 0 0 0 0 0  ?Number falls in past yr: 0 0 0  0  ?Injury with Fall? 0 0 0  0  ?Risk for fall due to : No Fall Risks  No Fall Risks    ?Follow up     Education provided  ? ? ?FALL RISK PREVENTION PERTAINING TO THE HOME: ? ?Any stairs in or around the home? Yes  ?If so, are there any without handrails? No  ?Home free of loose throw rugs in walkways, pet beds, electrical cords, etc? Yes  ?Adequate lighting in your home to reduce risk of falls? Yes  ? ?ASSISTIVE DEVICES UTILIZED TO PREVENT FALLS: ? ?Life alert? No  ?Use of a cane, walker or w/c? No  ?Grab bars in the bathroom? Yes  ?Shower chair or bench in shower? No  ?Elevated toilet seat or a handicapped toilet? No  ? ?TIMED UP AND GO: ? ?Was the test performed? Yes .  ?Length of time to ambulate 10 feet: 5 sec.  ? ?Gait steady and fast without use of assistive device ? ?Cognitive  Function: ?  ?  ? ?  06/21/2021  ? 10:11 AM  ?6CIT Screen  ?What Year? 0 points  ?What month? 0 points  ?What time? 0 points  ?Count back from 20 0 points  ?Months in reverse 0 points  ?Repeat phrase 0 points  ?Total Score

## 2021-06-21 NOTE — Patient Instructions (Addendum)
?Mr. Meulemans , ?Thank you for taking time to come for your Medicare Wellness Visit. I appreciate your ongoing commitment to your health goals. Please review the following plan we discussed and let me know if I can assist you in the future.  ? ?These are the goals we discussed: ? Goals   ? ?   Maintain current lifestyle (pt-stated)   ?   Travel More. ?  ? ?  ?  ?This is a list of the screening recommended for you and due dates:  ?Health Maintenance  ?Topic Date Due  ? COVID-19 Vaccine (4 - Booster for Pfizer series) 07/07/2021*  ? Zoster (Shingles) Vaccine (1 of 2) 03/09/2026*  ? Flu Shot  10/02/2021  ? Tetanus Vaccine  10/06/2023  ? Pneumonia Vaccine  Completed  ? Hepatitis C Screening: USPSTF Recommendation to screen - Ages 41-79 yo.  Completed  ? HPV Vaccine  Aged Out  ? Colon Cancer Screening  Discontinued  ?*Topic was postponed. The date shown is not the original due date.  ?  ?Advanced directives: Yes Patient will bring copy  ? ?Conditions/risks identified: None ? ?Next appointment: Follow up in one year for your annual wellness visit.  ? ?Preventive Care 36 Years and Older, Male ?Preventive care refers to lifestyle choices and visits with your health care provider that can promote health and wellness. ?What does preventive care include? ?A yearly physical exam. This is also called an annual well check. ?Dental exams once or twice a year. ?Routine eye exams. Ask your health care provider how often you should have your eyes checked. ?Personal lifestyle choices, including: ?Daily care of your teeth and gums. ?Regular physical activity. ?Eating a healthy diet. ?Avoiding tobacco and drug use. ?Limiting alcohol use. ?Practicing safe sex. ?Taking low doses of aspirin every day. ?Taking vitamin and mineral supplements as recommended by your health care provider. ?What happens during an annual well check? ?The services and screenings done by your health care provider during your annual well check will depend on your age,  overall health, lifestyle risk factors, and family history of disease. ?Counseling  ?Your health care provider may ask you questions about your: ?Alcohol use. ?Tobacco use. ?Drug use. ?Emotional well-being. ?Home and relationship well-being. ?Sexual activity. ?Eating habits. ?History of falls. ?Memory and ability to understand (cognition). ?Work and work Statistician. ?Screening  ?You may have the following tests or measurements: ?Height, weight, and BMI. ?Blood pressure. ?Lipid and cholesterol levels. These may be checked every 5 years, or more frequently if you are over 24 years old. ?Skin check. ?Lung cancer screening. You may have this screening every year starting at age 32 if you have a 30-pack-year history of smoking and currently smoke or have quit within the past 15 years. ?Fecal occult blood test (FOBT) of the stool. You may have this test every year starting at age 36. ?Flexible sigmoidoscopy or colonoscopy. You may have a sigmoidoscopy every 5 years or a colonoscopy every 10 years starting at age 71. ?Prostate cancer screening. Recommendations will vary depending on your family history and other risks. ?Hepatitis C blood test. ?Hepatitis B blood test. ?Sexually transmitted disease (STD) testing. ?Diabetes screening. This is done by checking your blood sugar (glucose) after you have not eaten for a while (fasting). You may have this done every 1-3 years. ?Abdominal aortic aneurysm (AAA) screening. You may need this if you are a current or former smoker. ?Osteoporosis. You may be screened starting at age 69 if you are at high risk. ?  Talk with your health care provider about your test results, treatment options, and if necessary, the need for more tests. ?Vaccines  ?Your health care provider may recommend certain vaccines, such as: ?Influenza vaccine. This is recommended every year. ?Tetanus, diphtheria, and acellular pertussis (Tdap, Td) vaccine. You may need a Td booster every 10 years. ?Zoster vaccine.  You may need this after age 78. ?Pneumococcal 13-valent conjugate (PCV13) vaccine. One dose is recommended after age 16. ?Pneumococcal polysaccharide (PPSV23) vaccine. One dose is recommended after age 57. ?Talk to your health care provider about which screenings and vaccines you need and how often you need them. ?This information is not intended to replace advice given to you by your health care provider. Make sure you discuss any questions you have with your health care provider. ?Document Released: 03/17/2015 Document Revised: 11/08/2015 Document Reviewed: 12/20/2014 ?Elsevier Interactive Patient Education ? 2017 [place]. ? ?Fall Prevention in the Home ?Falls can cause injuries. They can happen to people of all ages. There are many things you can do to make your home safe and to help prevent falls. ?What can I do on the outside of my home? ?Regularly fix the edges of walkways and driveways and fix any cracks. ?Remove anything that might make you trip as you walk through a door, such as a raised step or threshold. ?Trim any bushes or trees on the path to your home. ?Use bright outdoor lighting. ?Clear any walking paths of anything that might make someone trip, such as rocks or tools. ?Regularly check to see if handrails are loose or broken. Make sure that both sides of any steps have handrails. ?Any raised decks and porches should have guardrails on the edges. ?Have any leaves, snow, or ice cleared regularly. ?Use sand or salt on walking paths during winter. ?Clean up any spills in your garage right away. This includes oil or grease spills. ?What can I do in the bathroom? ?Use night lights. ?Install grab bars by the toilet and in the tub and shower. Do not use towel bars as grab bars. ?Use non-skid mats or decals in the tub or shower. ?If you need to sit down in the shower, use a plastic, non-slip stool. ?Keep the floor dry. Clean up any water that spills on the floor as soon as it happens. ?Remove soap  buildup in the tub or shower regularly. ?Attach bath mats securely with double-sided non-slip rug tape. ?Do not have throw rugs and other things on the floor that can make you trip. ?What can I do in the bedroom? ?Use night lights. ?Make sure that you have a light by your bed that is easy to reach. ?Do not use any sheets or blankets that are too big for your bed. They should not hang down onto the floor. ?Have a firm chair that has side arms. You can use this for support while you get dressed. ?Do not have throw rugs and other things on the floor that can make you trip. ?What can I do in the kitchen? ?Clean up any spills right away. ?Avoid walking on wet floors. ?Keep items that you use a lot in easy-to-reach places. ?If you need to reach something above you, use a strong step stool that has a grab bar. ?Keep electrical cords out of the way. ?Do not use floor polish or wax that makes floors slippery. If you must use wax, use non-skid floor wax. ?Do not have throw rugs and other things on the floor that can make  you trip. ?What can I do with my stairs? ?Do not leave any items on the stairs. ?Make sure that there are handrails on both sides of the stairs and use them. Fix handrails that are broken or loose. Make sure that handrails are as long as the stairways. ?Check any carpeting to make sure that it is firmly attached to the stairs. Fix any carpet that is loose or worn. ?Avoid having throw rugs at the top or bottom of the stairs. If you do have throw rugs, attach them to the floor with carpet tape. ?Make sure that you have a light switch at the top of the stairs and the bottom of the stairs. If you do not have them, ask someone to add them for you. ?What else can I do to help prevent falls? ?Wear shoes that: ?Do not have high heels. ?Have rubber bottoms. ?Are comfortable and fit you well. ?Are closed at the toe. Do not wear sandals. ?If you use a stepladder: ?Make sure that it is fully opened. Do not climb a closed  stepladder. ?Make sure that both sides of the stepladder are locked into place. ?Ask someone to hold it for you, if possible. ?Clearly mark and make sure that you can see: ?Any grab bars or handrails. ?Mayer Masker

## 2021-07-09 ENCOUNTER — Telehealth: Payer: Self-pay

## 2021-07-09 NOTE — Telephone Encounter (Signed)
error 

## 2021-07-17 ENCOUNTER — Other Ambulatory Visit: Payer: Self-pay

## 2021-07-17 ENCOUNTER — Ambulatory Visit: Payer: Self-pay | Admitting: Neurology

## 2021-07-17 MED ORDER — APIXABAN 5 MG PO TABS: 5.0000 mg | ORAL_TABLET | Freq: Two times a day (BID) | ORAL | 3 refills | Status: DC

## 2021-07-17 MED ORDER — METOPROLOL SUCCINATE ER 25 MG PO TB24: 25.0000 mg | ORAL_TABLET | Freq: Every day | ORAL | 3 refills | Status: DC

## 2021-07-19 ENCOUNTER — Encounter: Payer: Self-pay | Admitting: Neurology

## 2021-07-19 ENCOUNTER — Ambulatory Visit: Payer: Medicare Other | Admitting: Neurology

## 2021-07-19 VITALS — BP 141/87 | HR 78 | Ht 70.0 in | Wt 198.2 lb

## 2021-07-19 DIAGNOSIS — G40209 Localization-related (focal) (partial) symptomatic epilepsy and epileptic syndromes with complex partial seizures, not intractable, without status epilepticus: Secondary | ICD-10-CM

## 2021-07-19 MED ORDER — DIVALPROEX SODIUM 500 MG PO DR TAB
500.0000 mg | DELAYED_RELEASE_TABLET | Freq: Every day | ORAL | 2 refills | Status: DC
Start: 2021-07-19 — End: 2021-07-23

## 2021-07-19 NOTE — Progress Notes (Signed)
[facility] [address] [place]. [place] [address] [id number]       OFFICE CONSULT NOTE  Samuel Mayo Date of Birth:  1945-08-17 Medical Record Number:  [id number]   Referring MD:   Betty Martinique  Reason for Referral: Seizure  HPI: Samuel Mayo is a pleasant 76 year old male seen today for consultation for seizures.  He is accompanied by his wife [name].  History is obtained from them and review of electronic medical records and I personally reviewed pertinent available imaging films in PACS.  He has past medical history of hypertension seizures.  He had 2 episodes of witnessed generalized tonic-clonic seizures in July 2017 which were witnessed by his girlfriend at that time.  He was admitted and had EEG which was normal but patient reported that he had previous episodes of transient confusion and altered sensorium going back at least 4 years prior.  He was diagnosed with complex partial seizures with secondary generalization and started on Keppra 500 mg twice daily.  CT scan of the head and MRI scan were both done which were unremarkable.  EEG done twice was normal.  He was seen by me at that time and subsequently lost to follow-up.  Patient states that he moved to [facility] area and was seeing Dr. Melrose Nakayama neurologist there.  He started having some personality changes on Keppra and hence he was switched to Depakote about 2 years ago.  He is currently taking Depakote DR to 50 mg 3 times daily.  It worked quite well and for 6 months he had no episodes but is now having recurrent episodes of the frequency which is variable but about once every 2 to 3 months.  The episode consisted of a 6 stare and the patient also has involuntary chronic bending of his right elbow and arm.  This last 1 to 2 minutes and during this time he may be transiently unresponsive will not answer questions.  After this he quickly returns back to normal.  The last episode was on 06/27/2021 when he was driving  he suddenly stopped responding when his wife was speaking to him but he was able to follow her instructions and pulled over and she took over driving.  Fortunately did not have an accident.  Patient denies any specific trigger for these episodes but they occur at available frequency.  He has recently moved back to [place] area and wants to transfer neurological care to me.  Review of care everywhere shows that his last office visit visit with Dr. Melrose Nakayama was on 05/16/2021 and he was doing fine and no medication changes were made.  Valproic acid level on 02/01/2020 was 41 mg percent and on 04/14/2020 was 32 mg percent.  He also had a 72-hour ambulatory EEG with video on 06/25/2019 which was normal.  ROS:   14 system review of systems is positive for seizures, altered awareness, staring, and involuntary hand movement and all other systems negative  PMH:  Past Medical History:  Diagnosis Date   AK (actinic keratosis)    Allergy    Chicken pox    Compression fracture of L1 lumbar vertebra (HCC)    Compression fracture of thoracic vertebra (HCC)    Hypertension    Lentigines    Follows with DR Culton (derma)   Seizures (HCC)     Social History:  Social History   Socioeconomic History   Marital status: Married    Spouse name: [name]   Number of children: Not on file  Years of education: Not on file   Highest education level: Bachelor's degree (e.g., BA, AB, BS)  Occupational History   Not on file  Tobacco Use   Smoking status: Never   Smokeless tobacco: Never  Substance and Sexual Activity   Alcohol use: Yes    Alcohol/week: 1.0 standard drink    Types: 1 Cans of beer per week    Comment: socially   Drug use: No   Sexual activity: Not on file  Other Topics Concern   Not on file  Social History Narrative   Lives with wife [name]   R handed   Caffeine: 3 C of coffee AM   Social Determinants of Health   Financial Resource Strain: Low Risk    Difficulty of Paying Living  Expenses: Not hard at all  Food Insecurity: No Food Insecurity   Worried About Charity fundraiser in the Last Year: Never true   Ran Out of Food in the Last Year: Never true  Transportation Needs: No Transportation Needs   Lack of Transportation (Medical): No   Lack of Transportation (Non-Medical): No  Physical Activity: Sufficiently Active   Days of Exercise per Week: 4 days   Minutes of Exercise per Session: 60 min  Stress: No Stress Concern Present   Feeling of Stress : Only a little  Social Connections: Moderately Isolated   Frequency of Communication with Friends and Family: Once a week   Frequency of Social Gatherings with Friends and Family: Once a week   Attends Religious Services: Never   Marine scientist or Organizations: Yes   Attends Music therapist: More than 4 times per year   Marital Status: Living with partner  Intimate Partner Violence: Not At Risk   Fear of Current or Ex-Partner: No   Emotionally Abused: No   Physically Abused: No   Sexually Abused: No    Medications:   Current Outpatient Medications on File Prior to Visit  Medication Sig Dispense Refill   amLODipine (NORVASC) 5 MG tablet Take 1 tablet (5 mg total) by mouth daily. 90 tablet 3   apixaban (ELIQUIS) 5 MG TABS tablet Take 1 tablet (5 mg total) by mouth 2 (two) times daily. 60 tablet 3   beta carotene w/minerals (OCUVITE) tablet Take 1 tablet by mouth daily.     Cholecalciferol (VITAMIN D3) 10000 units TABS Take 1 tablet by mouth every evening.     gabapentin (NEURONTIN) 300 MG capsule Take 300 mg by mouth at bedtime.     ibandronate (BONIVA) 150 MG tablet TAKE 1 TABLET BY MOUTH EVERY 30 DAYS. TAKE IN AM WITH FULL GLASS OF WATER ON EMPTY STOMACH AND DON'T TAKE ANYTHING ELSE BY MOUTH OR LIE DOWN FOR THE NEXT 30 MINUTES 3 tablet 5   metoprolol succinate (TOPROL-XL) 25 MG 24 hr tablet Take 1 tablet (25 mg total) by mouth daily. 30 tablet 3   Multiple Vitamin (CALCIUM COMPLEX PO) Take  1 tablet by mouth in the morning, at noon, and at bedtime. Bone Up [place] Formulas     Multiple Vitamin (MULTIVITAMIN WITH MINERALS) TABS tablet Take 1 tablet by mouth in the morning, at noon, and at bedtime. Vitamin Code Whole Food Multivitamin     risperiDONE (RISPERDAL) 0.25 MG tablet Take 0.25 mg by mouth at bedtime.     Current Facility-Administered Medications on File Prior to Visit  Medication Dose Route Frequency Provider Last Rate Last Admin   0.9 %  sodium chloride infusion  500 mL Intravenous Once Cirigliano, Vito V, DO        Allergies:   Allergies  Allergen Reactions   Other Other (See Comments)    Neosporin Ophthalmic causes eyes to turn red    Physical Exam General: well developed, well nourished, seated, in no evident distress Head: head normocephalic and atraumatic.   Neck: supple with no carotid or supraclavicular bruits Cardiovascular: regular rate and rhythm, no murmurs Musculoskeletal: no deformity Skin:  no rash/petichiae Vascular:  Normal pulses all extremities  Neurologic Exam Mental Status: Awake and fully alert. Oriented to place and time. Recent and remote memory intact. Attention span, concentration and fund of knowledge appropriate. Mood and affect appropriate.  Cranial Nerves: Fundoscopic exam reveals sharp disc margins. Pupils equal, briskly reactive to light. Extraocular movements full without nystagmus. Visual fields full to confrontation. Hearing intact. Facial sensation intact. Face, tongue, palate moves normally and symmetrically.  Motor: Normal bulk and tone. Normal strength in all tested extremity muscles. Sensory.: intact to touch , pinprick , position and vibratory sensation.  Coordination: Rapid alternating movements normal in all extremities. Finger-to-nose and heel-to-shin performed accurately bilaterally. Gait and Station: Arises from chair without difficulty. Stance is normal. Gait demonstrates normal stride length and balance . Able to  heel, toe and tandem walk without difficulty.  Reflexes: 1+ and symmetric. Toes downgoing.         ASSESSMENT: 76 year old Caucasian male with recurrent stereotypical transient episodes of staring with altered awareness and tonic posturing of the right upper extremity likely partial complex seizures with some secondary generalization.  These episodes have been occurring since at least 2017 and possibly earlier.  He was doing well on Keppra but was switched to Depakote due to perceived side effects but seems to be having breakthrough episodes on the current dose     PLAN:I had a long discussion with the patient and his wife regarding his brief episodes of staring and altered awareness which sound like breakthrough complex partial seizures despite being on Depakote DR to 250 mg 3 times daily.  I recommend we check valproic acid levels , hepatic function panel and ammonia level today and increase the dose to Depakote DR 500 mg twice daily.  He was advised to avoid seizure provoking getting stimuli like sleep deprivation, medication noncompliance, stimulants and extremes of exertion and eating or drinking.  He was counseled not to drive till he has been seizure-free for 6 months as per [facility].  He will return for follow-up in the future in 3 months or call earlier if necessary.  Greater than 50% time during this 45-minute consultation visit for spent in counseling and coordination of care about his episodes of partial complex seizures and discussion about seizure prevention and treatment and answering questions. Antony Contras, MD  Note: This document was prepared with digital dictation and possible smart phrase technology. Any transcriptional errors that result from this process are unintentional.

## 2021-07-19 NOTE — Patient Instructions (Addendum)
I had a long discussion with the patient and his wife regarding his brief episodes of staring and altered awareness which sound like breakthrough complex partial seizures despite being on Depakote DR to 250 mg 3 times daily.  I recommend we check valproic acid levels , hepatic function panel and ammonia level today and increase the dose to Depakote DR 500 mg twice daily.  He was advised to avoid seizure provoking getting stimuli like sleep deprivation, medication noncompliance, stimulants and extremes of exertion and eating or drinking.  He was counseled not to drive till he has been seizure-free for 6 months as per [facility].  He will return for follow-up in the future in 3 months or call earlier if necessary.

## 2021-07-20 LAB — HEPATIC FUNCTION PANEL
ALT: 24 IU/L (ref 0–44)
AST: 30 IU/L (ref 0–40)
Albumin: 4.6 g/dL (ref 3.7–4.7)
Alkaline Phosphatase: 48 IU/L (ref 44–121)
Bilirubin Total: 0.5 mg/dL (ref 0.0–1.2)
Bilirubin, Direct: 0.17 mg/dL (ref 0.00–0.40)
Total Protein: 6.4 g/dL (ref 6.0–8.5)

## 2021-07-20 LAB — AMMONIA: Ammonia: 27 ug/dL — ABNORMAL LOW (ref 31–169)

## 2021-07-20 LAB — VALPROIC ACID LEVEL: Valproic Acid Lvl: 51 ug/mL (ref 50–100)

## 2021-07-22 ENCOUNTER — Encounter: Payer: Self-pay | Admitting: Neurology

## 2021-07-23 ENCOUNTER — Other Ambulatory Visit: Payer: Self-pay | Admitting: *Deleted

## 2021-07-23 MED ORDER — DIVALPROEX SODIUM 500 MG PO DR TAB: 500.0000 mg | DELAYED_RELEASE_TABLET | Freq: Two times a day (BID) | ORAL | 2 refills | Status: DC

## 2021-08-20 ENCOUNTER — Telehealth: Payer: Self-pay | Admitting: Neurology

## 2021-08-20 NOTE — Telephone Encounter (Signed)
Rescheduled 08/24 appt with pt over the phone- Dr. Leonie Man out.

## 2021-08-30 ENCOUNTER — Ambulatory Visit: Payer: Self-pay | Admitting: Neurology

## 2021-09-11 ENCOUNTER — Telehealth: Payer: Self-pay | Admitting: Internal Medicine

## 2021-09-11 NOTE — Telephone Encounter (Signed)
Informed patient that Dr. Harl Bowie will see him on f/u on 7/27. Also recommended to patient that if he has light-headedness, dizziness, chest pain or sob, he needs to go to the ED. He voiced understanding.

## 2021-09-11 NOTE — Telephone Encounter (Signed)
Patient c/o Palpitations:  High priority if patient c/o lightheadedness, shortness of breath, or chest pain  How long have you had palpitations/irregular HR/ Afib? Are you having the symptoms now? 3 days with afib  Are you currently experiencing lightheadedness, SOB or CP? No  Do you have a history of afib (atrial fibrillation) or irregular heart rhythm? Yes.   Have you checked your BP or HR? (document readings if available): Yes. He states BP is "in good shape"  Are you experiencing any other symptoms? No  The cardia mobile he is wearing states he has been in afib the last 3 days and he would like to discuss this with someone.

## 2021-09-11 NOTE — Telephone Encounter (Signed)
Patient stated his Samuel Mayo has shown afib for the past 3 days. He is asymptomatic and just walked 1.5 mile with his dog. Current BP 137/87, P 67. He was cardioverted 05/14/21. Taking all meds as prescribed.

## 2021-09-27 ENCOUNTER — Encounter: Payer: Self-pay | Admitting: Internal Medicine

## 2021-09-27 ENCOUNTER — Ambulatory Visit: Payer: Medicare Other | Admitting: Internal Medicine

## 2021-09-27 VITALS — BP 128/72 | HR 68 | Ht 72.0 in | Wt 202.2 lb

## 2021-09-27 DIAGNOSIS — Z01818 Encounter for other preprocedural examination: Secondary | ICD-10-CM

## 2021-09-27 DIAGNOSIS — I4892 Unspecified atrial flutter: Secondary | ICD-10-CM | POA: Diagnosis not present

## 2021-09-27 DIAGNOSIS — I4891 Unspecified atrial fibrillation: Secondary | ICD-10-CM

## 2021-09-27 NOTE — H&P (View-Only) (Signed)
Cardiology Office Note:    Date:  09/27/2021   ID:  [name], [name] 1945-03-27, MRN [id number]  PCP:  Martinique, [name], MD   [facility] Cardiologist:  Janina Mayo, MD     Referring MD: Martinique, [name], MD   No chief complaint on file. Atrial Flutter  History of Present Illness:    Samuel Mayo is a 76 y.o. male with a hx of  HTN, seizures, hx of mitral valve prosthetic annuloplasty ring , referral for atrial flutter  He had mitral valve surgery 20 years ago in [place], [place]. He said he had no problems afterwards and followed with cardiology. For ?mitral regurgitation. Not rheumatic heart disease. Unknown etiology. He moved here to [facility].  He was diagnosed with atrial flutter with his PCP at the end of December.  He denies hx of  stress test or LHC. LDL 74 mg/dL. He's never been on metoprolol. He prefers less medications. He has no bleeding history. He is asymptomatic.   Stroke- No Sleep apnea hx- No TSH- 2.4  Blood pressure mildly elevated in the system 140s-150s SBP; at home in the 130s.  EKG 02/21/2021- atrial flutter with variable A-V block  Interim Hx 06/05/2020 Saw EP. Flutter morphologies variable and CTI was not felt to be helpful. Scheduled DCCV which he completed 3/13. He had restoration of sinus rhythm.  Typically BPs are in good control in the ambulatory setting, he showed me on his phone.  Echo: Low normal LV/RV fxn. Prosthetic annuloplasty ring present in the mitral  position. Procedure Date: 2003. Mild mitral valve stenosis. MV peak gradient, 12.5 mmHg. The mean mitral valve gradient is 5.7 mmHg with average heart rate of 77 bpm. Mild dilation of the ascending aorta 41 mm  Interim Hx 09/27/2020 He returns because his kardia mobile which showed atrial fibrillation. He is asymptomatic. He was able to do his walks without symptoms.He denies significant SOB and LH.  Past Medical History:  Diagnosis Date   AK (actinic keratosis)     Allergy    Chicken pox    Compression fracture of L1 lumbar vertebra (HCC)    Compression fracture of thoracic vertebra (HCC)    Hypertension    Lentigines    Follows with DR Culton (derma)   Seizures ([place])     Past Surgical History:  Procedure Laterality Date   CARDIOVERSION N/A 05/14/2021   Procedure: CARDIOVERSION;  Surgeon: Donato Heinz, MD;  Location: [facility] ENDOSCOPY;  Service: Cardiovascular;  Laterality: N/A;   COLONOSCOPY     mitral vavle     Congetinal mitral valve disease.    Current Medications: Current Meds  Medication Sig   amLODipine (NORVASC) 5 MG tablet Take 1 tablet (5 mg total) by mouth daily.   apixaban (ELIQUIS) 5 MG TABS tablet Take 1 tablet (5 mg total) by mouth 2 (two) times daily.   beta carotene w/minerals (OCUVITE) tablet Take 1 tablet by mouth daily.   Cholecalciferol (VITAMIN D3) 10000 units TABS Take 1 tablet by mouth every evening.   divalproex (DEPAKOTE) 500 MG DR tablet Take 1 tablet (500 mg total) by mouth 2 (two) times daily.   gabapentin (NEURONTIN) 300 MG capsule Take 300 mg by mouth at bedtime.   ibandronate (BONIVA) 150 MG tablet TAKE 1 TABLET BY MOUTH EVERY 30 DAYS. TAKE IN AM WITH FULL GLASS OF WATER ON EMPTY STOMACH AND DON'T TAKE ANYTHING ELSE BY MOUTH OR LIE DOWN FOR THE NEXT 30 MINUTES   metoprolol  succinate (TOPROL-XL) 25 MG 24 hr tablet Take 1 tablet (25 mg total) by mouth daily.   Multiple Vitamin (CALCIUM COMPLEX PO) Take 1 tablet by mouth in the morning, at noon, and at bedtime. Bone Up [place] Formulas   Multiple Vitamin (MULTIVITAMIN WITH MINERALS) TABS tablet Take 1 tablet by mouth in the morning, at noon, and at bedtime. Vitamin Code Whole Food Multivitamin   risperiDONE (RISPERDAL) 0.25 MG tablet Take 0.25 mg by mouth at bedtime.   Current Facility-Administered Medications for the 09/27/21 encounter (Office Visit) with Janina Mayo, MD  Medication   0.9 %  sodium chloride infusion     Allergies:   Other   Social  History   Socioeconomic History   Marital status: Married    Spouse name: [name]   Number of children: Not on file   Years of education: Not on file   Highest education level: Bachelor's degree (e.g., BA, AB, BS)  Occupational History   Not on file  Tobacco Use   Smoking status: Never   Smokeless tobacco: Never  Substance and Sexual Activity   Alcohol use: Yes    Alcohol/week: 1.0 standard drink of alcohol    Types: 1 Cans of beer per week    Comment: socially   Drug use: No   Sexual activity: Not on file  Other Topics Concern   Not on file  Social History Narrative   Lives with wife [name]   R handed   Caffeine: 3 C of coffee AM   Social Determinants of Health   Financial Resource Strain: Low Risk  (06/21/2021)   Overall Financial Resource Strain (CARDIA)    Difficulty of Paying Living Expenses: Not hard at all  Food Insecurity: No Food Insecurity (06/21/2021)   Hunger Vital Sign    Worried About Running Out of Food in the Last Year: Never true    Ran Out of Food in the Last Year: Never true  Transportation Needs: No Transportation Needs (06/21/2021)   PRAPARE - Hydrologist (Medical): No    Lack of Transportation (Non-Medical): No  Physical Activity: Sufficiently Active (06/21/2021)   Exercise Vital Sign    Days of Exercise per Week: 4 days    Minutes of Exercise per Session: 60 min  Stress: No Stress Concern Present (06/21/2021)   [place]    Feeling of Stress : Only a little  Social Connections: Moderately Isolated (06/21/2021)   Social Connection and Isolation Panel [NHANES]    Frequency of Communication with Friends and Family: Once a week    Frequency of Social Gatherings with Friends and Family: Once a week    Attends Religious Services: Never    Marine scientist or Organizations: Yes    Attends Music therapist: More than 4 times per year    Marital  Status: Living with partner     Family History: The patient's family history includes Aneurysm in his paternal grandfather; Cancer in his father; Heart disease in his father, mother, and son; Prostate cancer in his father. There is no history of Colon cancer, Esophageal cancer, Stomach cancer, or Rectal cancer.  ROS:   Please see the history of present illness.     All other systems reviewed and are negative.  EKGs/Labs/Other Studies Reviewed:    The following studies were reviewed today:   EKG:  EKG is  ordered today.  The ekg ordered today demonstrates  EKG 03/19/2021-Atrial flutter with variable block Flutter waves read as STE  EKG 06/05/2021: Sinus rhythm with 1st degree AV block, LAD  EKG 09/27/2021: afib rate controlled 68 bpm  Recent Labs: 02/21/2021: BUN 24; Creatinine, Ser 1.09; Hemoglobin 13.6; Platelets 197.0; Potassium 4.3; Sodium 137; TSH 2.47 07/19/2021: ALT 24  Recent Lipid Panel    Component Value Date/Time   CHOL 163 02/16/2019 1051   TRIG 48.0 02/16/2019 1051   HDL 79.10 02/16/2019 1051   CHOLHDL 2 02/16/2019 1051   VLDL 9.6 02/16/2019 1051   LDLCALC 74 02/16/2019 1051     Risk Assessment/Calculations:    CHA2DS2-VASc Score = 3   This indicates a 3.2% annual risk of stroke. The patient's score is based upon: CHF History: 0 HTN History: 1 Diabetes History: 0 Stroke History: 0 Vascular Disease History: 0 Age Score: 2 Gender Score: 0          Physical Exam:    VS:    Vitals:   09/27/21 1359  BP: 128/72  Pulse: 68  SpO2: 96%      Wt Readings from Last 3 Encounters:  09/27/21 202 lb 3.2 oz (91.7 kg)  07/19/21 198 lb 3.2 oz (89.9 kg)  06/21/21 195 lb 4.8 oz (88.6 kg)     GEN:  Well nourished, well developed in no acute distress HEENT: Normal NECK: No JVD; No carotid bruits LYMPHATICS: No lymphadenopathy CARDIAC: irregular irregular rhythm, no murmurs, rubs, gallops RESPIRATORY:  Clear to auscultation without rales, wheezing or  rhonchi  ABDOMEN: Soft, non-tender, non-distended MUSCULOSKELETAL:  No edema; No deformity  SKIN: Warm and dry NEUROLOGIC:  Alert and oriented x 3 PSYCHIATRIC:  Normal affect   ASSESSMENT:    #Typical/Atypical Atrial flutter/Paroxysmal Atrial Fibrillation: Chads2Vasc 3. He presents today with flutter with variable block. He is asymptomatic. He was seen by EP, with variable flutter morphologies, CTI would not be beneficial. Underwent DCCV with restoration of sinus rhythm. However he is in atrial fibrillation today. He can be considered for dofetilide/sotalol  (Qtc 462 ms) load if not maintaining sinus rhythm. We discussed this. Would send back to Dr. Caryl Comes; otherwise can follow up here. We discussed flutter may recur; as long as he is asymptomatic and this is not persistent, no changes are recommended at this time. -continue  eliquis 5 mg BID -continue metoprolol XL 25 mg daily  - will send to afib clinic/ Dr. Caryl Comes to consider AAD  #Mitral Valve Annuloplasty Ring: no significant gradient only trivial regurgitation. Will continue to monitor. Requires dental prophylaxis  #HTN: ambulatory pressures well controlled.  PLAN:    In order of problems listed above:  DCCV F/U with afib clinic/Dr. Caryl Comes to consider AAD      Shared Decision Making/Informed Consent The risks (stroke, cardiac arrhythmias rarely resulting in the need for a temporary or permanent pacemaker, skin irritation or burns and complications associated with conscious sedation including aspiration, arrhythmia, respiratory failure and death), benefits (restoration of normal sinus rhythm) and alternatives of a direct current cardioversion were explained in detail to Mr. Ybarra and he agrees to proceed.      Medication Adjustments/Labs and Tests Ordered: Current medicines are reviewed at length with the patient today.  Concerns regarding medicines are outlined above.  Orders Placed This Encounter  Procedures   Basic metabolic  panel   CBC   EKG 12-Lead   No orders of the defined types were placed in this encounter.   Patient Instructions  Medication Instructions:  Your physician recommends that  you continue on your current medications as directed. Please refer to the Current Medication list given to you today.  *If you need a refill on your cardiac medications before your next appointment, please call your pharmacy*  Lab Work: Your physician recommends that you return for lab work 3 days prior to Cardioversion:  BMP CBC  If you have labs (blood work) drawn today and your tests are completely normal, you will receive your results only by: MyChart Message (if you have MyChart) OR A paper copy in the mail If you have any lab test that is abnormal or we need to change your treatment, we will call you to review the results.  Testing/Procedures: Your physician has recommended that you have a Cardioversion (DCCV). Electrical Cardioversion uses a jolt of electricity to your heart either through paddles or wired patches attached to your chest. This is a controlled, usually prescheduled, procedure. Defibrillation is done under light anesthesia in the hospital, and you usually go home the day of the procedure. This is done to get your heart back into a normal rhythm. You are not awake for the procedure. Please see the instruction sheet given to you today.   Follow-Up: At [facility], you and your health needs are our priority.  As part of our continuing mission to provide you with exceptional heart care, we have created designated Provider Care Teams.  These Care Teams include your primary Cardiologist (physician) and Advanced Practice Providers (APPs -  Physician Assistants and Nurse Practitioners) who all work together to provide you with the care you need, when you need it.  We recommend signing up for the patient portal called "MyChart".  Sign up information is provided on this After Visit Summary.  MyChart is  used to connect with patients for Virtual Visits (Telemedicine).  Patients are able to view lab/test results, encounter notes, upcoming appointments, etc.  Non-urgent messages can be sent to your provider as well.   To learn more about what you can do with MyChart, go to [email or web address].    Your next appointment:   First available    The format for your next appointment:   In Person  Provider:   Virl Axe, MD    Other Instructions Dear Lucy Antigua,  You are scheduled for a Cardioversion on 10/08/21 with Dr. Gasper Sells.  Please arrive at the [facility] (Main Entrance A) at [facility]: [address] [place], [place] [address] at 9:00 AM.  DIET: Nothing to eat or drink after midnight except a sip of water with medications (see medication instructions below)  FYI: For your safety, and to allow [place] to monitor your vital signs accurately during the surgery/procedure we request that   if you have artificial nails, gel coating, SNS etc. Please have those removed prior to your surgery/procedure. Not having the nail coverings /polish removed may result in cancellation or delay of your surgery/procedure.   Medication Instructions:   Continue your anticoagulant: Eliquis  You will need to continue your anticoagulant after your procedure until you  are told by your  Provider that it is safe to stop   Labs: If patient is on Coumadin, patient needs pt/INR, CBC, BMET within 3 days (No pt/INR needed for patients taking Xarelto, Eliquis, Pradaxa) For patients receiving anesthesia for TEE and all Cardioversion patients: BMET, CBC within 1 week  Come to the lab at [address] between the hours of 8:00 am and 4:30 pm. You do not have to be  fasting.   You must have a responsible person to drive you home and stay in the waiting area during your procedure. Failure to do so could result in cancellation.  Bring your insurance cards.  *Special Note: Every  effort is made to have your procedure done on time. Occasionally there are emergencies that occur at the hospital that may cause delays. Please be patient if a delay does occur.    Important Information About Sugar         Signed, Janina Mayo, MD  09/27/2021 2:31 PM    [facility]

## 2021-09-27 NOTE — Progress Notes (Signed)
Cardiology Office Note:    Date:  09/27/2021   ID:  Samuel, Mayo 01-05-1946, MRN [id number]  PCP:  Martinique, [name], MD   [place] [facility] Cardiologist:  Janina Mayo, MD     Referring MD: Martinique, [name], MD   No chief complaint on file. Atrial Flutter  History of Present Illness:    Samuel Mayo is a 76 y.o. male with a hx of  HTN, seizures, hx of mitral valve prosthetic annuloplasty ring , referral for atrial flutter  He had mitral valve surgery 20 years ago in [place], [place]. He said he had no problems afterwards and followed with cardiology. For ?mitral regurgitation. Not rheumatic heart disease. Unknown etiology. He moved here to [facility].  He was diagnosed with atrial flutter with his PCP at the end of December.  He denies hx of  stress test or LHC. LDL 74 mg/dL. He's never been on metoprolol. He prefers less medications. He has no bleeding history. He is asymptomatic.   Stroke- No Sleep apnea hx- No TSH- 2.4  Blood pressure mildly elevated in the system 140s-150s SBP; at home in the 130s.  EKG 02/21/2021- atrial flutter with variable A-V block  Interim Hx 06/05/2020 Saw EP. Flutter morphologies variable and CTI was not felt to be helpful. Scheduled DCCV which he completed 3/13. He had restoration of sinus rhythm.  Typically BPs are in good control in the ambulatory setting, he showed me on his phone.  Echo: Low normal LV/RV fxn. Prosthetic annuloplasty ring present in the mitral  position. Procedure Date: 2003. Mild mitral valve stenosis. MV peak gradient, 12.5 mmHg. The mean mitral valve gradient is 5.7 mmHg with average heart rate of 77 bpm. Mild dilation of the ascending aorta 41 mm  Interim Hx 09/27/2020 He returns because his kardia mobile which showed atrial fibrillation. He is asymptomatic. He was able to do his walks without symptoms.He denies significant SOB and LH.  Past Medical History:  Diagnosis Date   AK (actinic keratosis)     Allergy    Chicken pox    Compression fracture of L1 lumbar vertebra (HCC)    Compression fracture of thoracic vertebra (HCC)    Hypertension    Lentigines    Follows with DR Culton (derma)   Seizures ([place])     Past Surgical History:  Procedure Laterality Date   CARDIOVERSION N/A 05/14/2021   Procedure: CARDIOVERSION;  Surgeon: Donato Heinz, MD;  Location: [facility] ENDOSCOPY;  Service: Cardiovascular;  Laterality: N/A;   COLONOSCOPY     mitral vavle     Congetinal mitral valve disease.    Current Medications: Current Meds  Medication Sig   amLODipine (NORVASC) 5 MG tablet Take 1 tablet (5 mg total) by mouth daily.   apixaban (ELIQUIS) 5 MG TABS tablet Take 1 tablet (5 mg total) by mouth 2 (two) times daily.   beta carotene w/minerals (OCUVITE) tablet Take 1 tablet by mouth daily.   Cholecalciferol (VITAMIN D3) 10000 units TABS Take 1 tablet by mouth every evening.   divalproex (DEPAKOTE) 500 MG DR tablet Take 1 tablet (500 mg total) by mouth 2 (two) times daily.   gabapentin (NEURONTIN) 300 MG capsule Take 300 mg by mouth at bedtime.   ibandronate (BONIVA) 150 MG tablet TAKE 1 TABLET BY MOUTH EVERY 30 DAYS. TAKE IN AM WITH FULL GLASS OF WATER ON EMPTY STOMACH AND DON'T TAKE ANYTHING ELSE BY MOUTH OR LIE DOWN FOR THE NEXT 30 MINUTES   metoprolol  succinate (TOPROL-XL) 25 MG 24 hr tablet Take 1 tablet (25 mg total) by mouth daily.   Multiple Vitamin (CALCIUM COMPLEX PO) Take 1 tablet by mouth in the morning, at noon, and at bedtime. Bone Up [place] Formulas   Multiple Vitamin (MULTIVITAMIN WITH MINERALS) TABS tablet Take 1 tablet by mouth in the morning, at noon, and at bedtime. Vitamin Code Whole Food Multivitamin   risperiDONE (RISPERDAL) 0.25 MG tablet Take 0.25 mg by mouth at bedtime.   Current Facility-Administered Medications for the 09/27/21 encounter (Office Visit) with Janina Mayo, MD  Medication   0.9 %  sodium chloride infusion     Allergies:   Other   Social  History   Socioeconomic History   Marital status: Married    Spouse name: [name]   Number of children: Not on file   Years of education: Not on file   Highest education level: Bachelor's degree (e.g., BA, AB, BS)  Occupational History   Not on file  Tobacco Use   Smoking status: Never   Smokeless tobacco: Never  Substance and Sexual Activity   Alcohol use: Yes    Alcohol/week: 1.0 standard drink of alcohol    Types: 1 Cans of beer per week    Comment: socially   Drug use: No   Sexual activity: Not on file  Other Topics Concern   Not on file  Social History Narrative   Lives with wife [name]   R handed   Caffeine: 3 C of coffee AM   Social Determinants of Health   Financial Resource Strain: Low Risk  (06/21/2021)   Overall Financial Resource Strain (CARDIA)    Difficulty of Paying Living Expenses: Not hard at all  Food Insecurity: No Food Insecurity (06/21/2021)   Hunger Vital Sign    Worried About Running Out of Food in the Last Year: Never true    Ran Out of Food in the Last Year: Never true  Transportation Needs: No Transportation Needs (06/21/2021)   PRAPARE - Hydrologist (Medical): No    Lack of Transportation (Non-Medical): No  Physical Activity: Sufficiently Active (06/21/2021)   Exercise Vital Sign    Days of Exercise per Week: 4 days    Minutes of Exercise per Session: 60 min  Stress: No Stress Concern Present (06/21/2021)   [place]    Feeling of Stress : Only a little  Social Connections: Moderately Isolated (06/21/2021)   Social Connection and Isolation Panel [NHANES]    Frequency of Communication with Friends and Family: Once a week    Frequency of Social Gatherings with Friends and Family: Once a week    Attends Religious Services: Never    Marine scientist or Organizations: Yes    Attends Music therapist: More than 4 times per year    Marital  Status: Living with partner     Family History: The patient's family history includes Aneurysm in his paternal grandfather; Cancer in his father; Heart disease in his father, mother, and son; Prostate cancer in his father. There is no history of Colon cancer, Esophageal cancer, Stomach cancer, or Rectal cancer.  ROS:   Please see the history of present illness.     All other systems reviewed and are negative.  EKGs/Labs/Other Studies Reviewed:    The following studies were reviewed today:   EKG:  EKG is  ordered today.  The ekg ordered today demonstrates  EKG 03/19/2021-Atrial flutter with variable block Flutter waves read as STE  EKG 06/05/2021: Sinus rhythm with 1st degree AV block, LAD  EKG 09/27/2021: afib rate controlled 68 bpm  Recent Labs: 02/21/2021: BUN 24; Creatinine, Ser 1.09; Hemoglobin 13.6; Platelets 197.0; Potassium 4.3; Sodium 137; TSH 2.47 07/19/2021: ALT 24  Recent Lipid Panel    Component Value Date/Time   CHOL 163 02/16/2019 1051   TRIG 48.0 02/16/2019 1051   HDL 79.10 02/16/2019 1051   CHOLHDL 2 02/16/2019 1051   VLDL 9.6 02/16/2019 1051   LDLCALC 74 02/16/2019 1051     Risk Assessment/Calculations:    CHA2DS2-VASc Score = 3   This indicates a 3.2% annual risk of stroke. The patient's score is based upon: CHF History: 0 HTN History: 1 Diabetes History: 0 Stroke History: 0 Vascular Disease History: 0 Age Score: 2 Gender Score: 0          Physical Exam:    VS:    Vitals:   09/27/21 1359  BP: 128/72  Pulse: 68  SpO2: 96%      Wt Readings from Last 3 Encounters:  09/27/21 202 lb 3.2 oz (91.7 kg)  07/19/21 198 lb 3.2 oz (89.9 kg)  06/21/21 195 lb 4.8 oz (88.6 kg)     GEN:  Well nourished, well developed in no acute distress HEENT: Normal NECK: No JVD; No carotid bruits LYMPHATICS: No lymphadenopathy CARDIAC: irregular irregular rhythm, no murmurs, rubs, gallops RESPIRATORY:  Clear to auscultation without rales, wheezing or  rhonchi  ABDOMEN: Soft, non-tender, non-distended MUSCULOSKELETAL:  No edema; No deformity  SKIN: Warm and dry NEUROLOGIC:  Alert and oriented x 3 PSYCHIATRIC:  Normal affect   ASSESSMENT:    #Typical/Atypical Atrial flutter/Paroxysmal Atrial Fibrillation: Chads2Vasc 3. He presents today with flutter with variable block. He is asymptomatic. He was seen by EP, with variable flutter morphologies, CTI would not be beneficial. Underwent DCCV with restoration of sinus rhythm. However he is in atrial fibrillation today. He can be considered for dofetilide/sotalol  (Qtc 462 ms) load if not maintaining sinus rhythm. We discussed this. Would send back to Dr. Caryl Comes; otherwise can follow up here. We discussed flutter may recur; as long as he is asymptomatic and this is not persistent, no changes are recommended at this time. -continue  eliquis 5 mg BID -continue metoprolol XL 25 mg daily  - will send to afib clinic/ Dr. Caryl Comes to consider AAD  #Mitral Valve Annuloplasty Ring: no significant gradient only trivial regurgitation. Will continue to monitor. Requires dental prophylaxis  #HTN: ambulatory pressures well controlled.  PLAN:    In order of problems listed above:  DCCV F/U with afib clinic/Dr. Caryl Comes to consider AAD      Shared Decision Making/Informed Consent The risks (stroke, cardiac arrhythmias rarely resulting in the need for a temporary or permanent pacemaker, skin irritation or burns and complications associated with conscious sedation including aspiration, arrhythmia, respiratory failure and death), benefits (restoration of normal sinus rhythm) and alternatives of a direct current cardioversion were explained in detail to Mr. Hartog and he agrees to proceed.      Medication Adjustments/Labs and Tests Ordered: Current medicines are reviewed at length with the patient today.  Concerns regarding medicines are outlined above.  Orders Placed This Encounter  Procedures   Basic metabolic  panel   CBC   EKG 12-Lead   No orders of the defined types were placed in this encounter.   Patient Instructions  Medication Instructions:  Your physician recommends that  you continue on your current medications as directed. Please refer to the Current Medication list given to you today.  *If you need a refill on your cardiac medications before your next appointment, please call your pharmacy*  Lab Work: Your physician recommends that you return for lab work 3 days prior to Cardioversion:  BMP CBC  If you have labs (blood work) drawn today and your tests are completely normal, you will receive your results only by: MyChart Message (if you have MyChart) OR A paper copy in the mail If you have any lab test that is abnormal or we need to change your treatment, we will call you to review the results.  Testing/Procedures: Your physician has recommended that you have a Cardioversion (DCCV). Electrical Cardioversion uses a jolt of electricity to your heart either through paddles or wired patches attached to your chest. This is a controlled, usually prescheduled, procedure. Defibrillation is done under light anesthesia in the hospital, and you usually go home the day of the procedure. This is done to get your heart back into a normal rhythm. You are not awake for the procedure. Please see the instruction sheet given to you today.   Follow-Up: At [facility], you and your health needs are our priority.  As part of our continuing mission to provide you with exceptional heart care, we have created designated Provider Care Teams.  These Care Teams include your primary Cardiologist (physician) and Advanced Practice Providers (APPs -  Physician Assistants and Nurse Practitioners) who all work together to provide you with the care you need, when you need it.  We recommend signing up for the patient portal called "MyChart".  Sign up information is provided on this After Visit Summary.  MyChart is  used to connect with patients for Virtual Visits (Telemedicine).  Patients are able to view lab/test results, encounter notes, upcoming appointments, etc.  Non-urgent messages can be sent to your provider as well.   To learn more about what you can do with MyChart, go to [email or web address].    Your next appointment:   First available    The format for your next appointment:   In Person  Provider:   Virl Axe, MD    Other Instructions Dear Lucy Antigua,  You are scheduled for a Cardioversion on 10/08/21 with Dr. Gasper Sells.  Please arrive at the [facility] (Main Entrance A) at [facility]: [address] [place], [place] [address] at 9:00 AM.  DIET: Nothing to eat or drink after midnight except a sip of water with medications (see medication instructions below)  FYI: For your safety, and to allow [place] to monitor your vital signs accurately during the surgery/procedure we request that   if you have artificial nails, gel coating, SNS etc. Please have those removed prior to your surgery/procedure. Not having the nail coverings /polish removed may result in cancellation or delay of your surgery/procedure.   Medication Instructions:   Continue your anticoagulant: Eliquis  You will need to continue your anticoagulant after your procedure until you  are told by your  Provider that it is safe to stop   Labs: If patient is on Coumadin, patient needs pt/INR, CBC, BMET within 3 days (No pt/INR needed for patients taking Xarelto, Eliquis, Pradaxa) For patients receiving anesthesia for TEE and all Cardioversion patients: BMET, CBC within 1 week  Come to the lab at [place] [address] between the hours of 8:00 am and 4:30 pm. You do not have to be  fasting.   You must have a responsible person to drive you home and stay in the waiting area during your procedure. Failure to do so could result in cancellation.  Bring your insurance cards.  *Special Note: Every  effort is made to have your procedure done on time. Occasionally there are emergencies that occur at the hospital that may cause delays. Please be patient if a delay does occur.    Important Information About Sugar         Signed, Janina Mayo, MD  09/27/2021 2:31 PM    [facility]

## 2021-09-27 NOTE — Patient Instructions (Addendum)
Medication Instructions:  Your physician recommends that you continue on your current medications as directed. Please refer to the Current Medication list given to you today.  *If you need a refill on your cardiac medications before your next appointment, please call your pharmacy*  Lab Work: Your physician recommends that you return for lab work 3 days prior to Cardioversion:  BMP CBC  If you have labs (blood work) drawn today and your tests are completely normal, you will receive your results only by: MyChart Message (if you have MyChart) OR A paper copy in the mail If you have any lab test that is abnormal or we need to change your treatment, we will call you to review the results.  Testing/Procedures: Your physician has recommended that you have a Cardioversion (DCCV). Electrical Cardioversion uses a jolt of electricity to your heart either through paddles or wired patches attached to your chest. This is a controlled, usually prescheduled, procedure. Defibrillation is done under light anesthesia in the hospital, and you usually go home the day of the procedure. This is done to get your heart back into a normal rhythm. You are not awake for the procedure. Please see the instruction sheet given to you today.  You have been referred to Afib clinic  Follow-Up: At [facility], you and your health needs are our priority.  As part of our continuing mission to provide you with exceptional heart care, we have created designated Provider Care Teams.  These Care Teams include your primary Cardiologist (physician) and Advanced Practice Providers (APPs -  Physician Assistants and Nurse Practitioners) who all work together to provide you with the care you need, when you need it.  We recommend signing up for the patient portal called "MyChart".  Sign up information is provided on this After Visit Summary.  MyChart is used to connect with patients for Virtual Visits (Telemedicine).  Patients are able  to view lab/test results, encounter notes, upcoming appointments, etc.  Non-urgent messages can be sent to your provider as well.   To learn more about what you can do with MyChart, go to [email or web address].    Your next appointment:   First available    The format for your next appointment:   In Person  Provider:   Virl Axe, MD    Other Instructions Dear Samuel Mayo,  You are scheduled for a Cardioversion on 10/08/21 with Dr. Gasper Sells.  Please arrive at the [facility] (Main Entrance A) at [facility]: [address] [place], [place] [address] at 9:00 AM.  DIET: Nothing to eat or drink after midnight except a sip of water with medications (see medication instructions below)  FYI: For your safety, and to allow [place] to monitor your vital signs accurately during the surgery/procedure we request that   if you have artificial nails, gel coating, SNS etc. Please have those removed prior to your surgery/procedure. Not having the nail coverings /polish removed may result in cancellation or delay of your surgery/procedure.   Medication Instructions:   Continue your anticoagulant: Eliquis  You will need to continue your anticoagulant after your procedure until you  are told by your  Provider that it is safe to stop   Labs: If patient is on Coumadin, patient needs pt/INR, CBC, BMET within 3 days (No pt/INR needed for patients taking Xarelto, Eliquis, Pradaxa) For patients receiving anesthesia for TEE and all Cardioversion patients: BMET, CBC within 1 week  Come to the lab at [place] [address] between  the hours of 8:00 am and 4:30 pm. You do not have to be fasting.   You must have a responsible person to drive you home and stay in the waiting area during your procedure. Failure to do so could result in cancellation.  Bring your insurance cards.  *Special Note: Every effort is made to have your procedure done on time. Occasionally there are emergencies  that occur at the hospital that may cause delays. Please be patient if a delay does occur.    Important Information About Sugar

## 2021-10-01 ENCOUNTER — Encounter (HOSPITAL_COMMUNITY): Payer: Self-pay | Admitting: Internal Medicine

## 2021-10-05 ENCOUNTER — Other Ambulatory Visit: Payer: Self-pay | Admitting: Internal Medicine

## 2021-10-05 DIAGNOSIS — C44629 Squamous cell carcinoma of skin of left upper limb, including shoulder: Secondary | ICD-10-CM | POA: Diagnosis not present

## 2021-10-05 DIAGNOSIS — L578 Other skin changes due to chronic exposure to nonionizing radiation: Secondary | ICD-10-CM | POA: Diagnosis not present

## 2021-10-05 DIAGNOSIS — L570 Actinic keratosis: Secondary | ICD-10-CM | POA: Diagnosis not present

## 2021-10-05 DIAGNOSIS — I4891 Unspecified atrial fibrillation: Secondary | ICD-10-CM

## 2021-10-05 DIAGNOSIS — Z01818 Encounter for other preprocedural examination: Secondary | ICD-10-CM | POA: Diagnosis not present

## 2021-10-05 DIAGNOSIS — D225 Melanocytic nevi of trunk: Secondary | ICD-10-CM | POA: Diagnosis not present

## 2021-10-05 DIAGNOSIS — L821 Other seborrheic keratosis: Secondary | ICD-10-CM | POA: Diagnosis not present

## 2021-10-06 LAB — CBC
Hematocrit: 38.2 % (ref 37.5–51.0)
Hemoglobin: 13.5 g/dL (ref 13.0–17.7)
MCH: 33.7 pg — ABNORMAL HIGH (ref 26.6–33.0)
MCHC: 35.3 g/dL (ref 31.5–35.7)
MCV: 95 fL (ref 79–97)
Platelets: 208 10*3/uL (ref 150–450)
RBC: 4.01 x10E6/uL — ABNORMAL LOW (ref 4.14–5.80)
RDW: 11.8 % (ref 11.6–15.4)
WBC: 4.8 10*3/uL (ref 3.4–10.8)

## 2021-10-06 LAB — BASIC METABOLIC PANEL
BUN/Creatinine Ratio: 19 (ref 10–24)
BUN: 23 mg/dL (ref 8–27)
CO2: 24 mmol/L (ref 20–29)
Calcium: 9.5 mg/dL (ref 8.6–10.2)
Chloride: 100 mmol/L (ref 96–106)
Creatinine, Ser: 1.19 mg/dL (ref 0.76–1.27)
Glucose: 93 mg/dL (ref 70–99)
Potassium: 4.7 mmol/L (ref 3.5–5.2)
Sodium: 137 mmol/L (ref 134–144)
eGFR: 63 mL/min/{1.73_m2} (ref >59)

## 2021-10-08 ENCOUNTER — Ambulatory Visit (HOSPITAL_BASED_OUTPATIENT_CLINIC_OR_DEPARTMENT_OTHER): Payer: Medicare Other | Admitting: Anesthesiology

## 2021-10-08 ENCOUNTER — Encounter (HOSPITAL_COMMUNITY): Payer: Self-pay | Admitting: Internal Medicine

## 2021-10-08 ENCOUNTER — Encounter (HOSPITAL_COMMUNITY)
Admission: RE | Disposition: A | Discharge status: Home / Self Care | Payer: Self-pay | Source: Ambulatory Visit | Attending: Internal Medicine

## 2021-10-08 ENCOUNTER — Ambulatory Visit (HOSPITAL_COMMUNITY)
Admission: RE | Admit: 2021-10-08 | Discharge: 2021-10-08 | Disposition: A | Discharge status: Home / Self Care | Payer: Medicare Other | Source: Ambulatory Visit | Attending: Internal Medicine | Admitting: Internal Medicine

## 2021-10-08 ENCOUNTER — Ambulatory Visit (HOSPITAL_COMMUNITY): Payer: Medicare Other | Admitting: Anesthesiology

## 2021-10-08 ENCOUNTER — Other Ambulatory Visit: Payer: Self-pay

## 2021-10-08 DIAGNOSIS — I4891 Unspecified atrial fibrillation: Secondary | ICD-10-CM

## 2021-10-08 DIAGNOSIS — I480 Paroxysmal atrial fibrillation: Secondary | ICD-10-CM | POA: Diagnosis not present

## 2021-10-08 DIAGNOSIS — Z79899 Other long term (current) drug therapy: Secondary | ICD-10-CM | POA: Diagnosis not present

## 2021-10-08 DIAGNOSIS — I10 Essential (primary) hypertension: Secondary | ICD-10-CM

## 2021-10-08 DIAGNOSIS — Z7901 Long term (current) use of anticoagulants: Secondary | ICD-10-CM | POA: Diagnosis not present

## 2021-10-08 DIAGNOSIS — M1990 Unspecified osteoarthritis, unspecified site: Secondary | ICD-10-CM | POA: Diagnosis not present

## 2021-10-08 DIAGNOSIS — I482 Chronic atrial fibrillation, unspecified: Secondary | ICD-10-CM

## 2021-10-08 DIAGNOSIS — R569 Unspecified convulsions: Secondary | ICD-10-CM | POA: Insufficient documentation

## 2021-10-08 DIAGNOSIS — I4892 Unspecified atrial flutter: Secondary | ICD-10-CM | POA: Diagnosis not present

## 2021-10-08 HISTORY — PX: CARDIOVERSION: SHX1299

## 2021-10-08 SURGERY — CARDIOVERSION
Anesthesia: General

## 2021-10-08 MED ORDER — SODIUM CHLORIDE 0.9 % IV SOLN: INTRAVENOUS | Status: DC

## 2021-10-08 MED ORDER — PROPOFOL 10 MG/ML IV BOLUS
INTRAVENOUS | Status: DC | PRN
  Administered 2021-10-08: 60 mg via INTRAVENOUS

## 2021-10-08 MED ORDER — LIDOCAINE HCL (CARDIAC) PF 100 MG/5ML IV SOSY
PREFILLED_SYRINGE | INTRAVENOUS | Status: DC | PRN
  Administered 2021-10-08: 40 mg via INTRAVENOUS

## 2021-10-08 NOTE — CV Procedure (Signed)
   Electrical Cardioversion Procedure Note Samuel Mayo [id number] 04/08/1945  Procedure: Electrical Cardioversion Indications:  Atrial Fibrillation  Time Out: Verified patient identification, verified procedure,medications/allergies/relevent history reviewed, required imaging and test results available.  Performed  Procedure Details  The patient was NPO after midnight. Anesthesia was administered at the beside  by Dr.Hollis  with 40 mg of lidocaine and 60 mg propofol.  Cardioversion was done with synchronized biphasic defibrillation with AP pads with 200 Joules.  The patient converted to normal sinus rhythm. The patient tolerated the procedure well   IMPRESSION:  Successful cardioversion of atrial fibrillation    Samuel Mayo Samuel Mayo 10/08/2021, 9:33 AM

## 2021-10-08 NOTE — Transfer of Care (Signed)
Immediate Anesthesia Transfer of Care Note  Patient: Samuel Mayo  Procedure(s) Performed: CARDIOVERSION  Patient Location: PACU  Anesthesia Type:General  Level of Consciousness: drowsy  Airway & Oxygen Therapy: Patient Spontanous Breathing  Post-op Assessment: Report given to RN and Post -op Vital signs reviewed and stable  Post vital signs: Reviewed and stable  Last Vitals:  Vitals Value Taken Time  BP    Temp    Pulse    Resp    SpO2      Last Pain:  Vitals:   10/08/21 0907  TempSrc: Temporal  PainSc: 0-No pain         Complications: No notable events documented.

## 2021-10-08 NOTE — Anesthesia Preprocedure Evaluation (Addendum)
Anesthesia Evaluation  Patient identified by MRN, date of birth, ID band Patient awake    Reviewed: Allergy & Precautions, NPO status , Patient's Chart, lab work & pertinent test results  Airway Mallampati: II       Dental  (+) Teeth Intact, Dental Advisory Given   Pulmonary    breath sounds clear to auscultation       Cardiovascular hypertension, + dysrhythmias Atrial Fibrillation  Rhythm:Irregular Rate:Normal  Echo: 1. There is a prosthetic annuloplasty ring present in the mitral  position. Procedure Date: 2003. Location: [place], [place]  2. Right ventricular systolic function is low normal. The right  ventricular size is moderately enlarged. There is normal pulmonary artery  systolic pressure.  3. Left ventricular ejection fraction, by estimation, is 50 to 55%. The  left ventricle has low normal function. The left ventricle has no regional  wall motion abnormalities. Left ventricular diastolic function could not  be evaluated.  4. Aortic dilatation noted. There is mild dilatation of the ascending  aorta, measuring 41 mm.  5. The aortic valve is tricuspid. There is mild calcification of the  aortic valve. There is mild thickening of the aortic valve. Aortic valve  regurgitation is not visualized. Aortic valve sclerosis is present, with  no evidence of aortic valve stenosis.  6. Tricuspid valve regurgitation is moderate.  7. Left atrial size was mild to moderately dilated.  8. Right atrial size was severely dilated.  9. The inferior vena cava is dilated in size with <50% respiratory  variability, suggesting right atrial pressure of 15 mmHg.    Neuro/Psych Seizures -,     GI/Hepatic negative GI ROS, Neg liver ROS,   Endo/Other    Renal/GU      Musculoskeletal  (+) Arthritis ,   Abdominal Normal abdominal exam  (+)   Peds  Hematology   Anesthesia Other Findings   Reproductive/Obstetrics                             Anesthesia Physical Anesthesia Plan  ASA: 3  Anesthesia Plan: General   Post-op Pain Management:    Induction: Intravenous  PONV Risk Score and Plan: 0  Airway Management Planned: Natural Airway and Simple Face Mask  Additional Equipment: None  Intra-op Plan:   Post-operative Plan:   Informed Consent: I have reviewed the patients History and Physical, chart, labs and discussed the procedure including the risks, benefits and alternatives for the proposed anesthesia with the patient or authorized representative who has indicated his/her understanding and acceptance.       Plan Discussed with: CRNA  Anesthesia Plan Comments:        Anesthesia Quick Evaluation

## 2021-10-08 NOTE — Interval H&P Note (Signed)
History and Physical Interval Note:  10/08/2021 9:25 AM  Samuel Mayo  has presented today for surgery, with the diagnosis of AFIB.  The various methods of treatment have been discussed with the patient and family. After consideration of risks, benefits and other options for treatment, the patient has consented to  Procedure(s): CARDIOVERSION (N/A) as a surgical intervention.  The patient's history has been reviewed, patient examined, no change in status, stable for surgery.  I have reviewed the patient's chart and labs.  Questions were answered to the patient's satisfaction.     Samuel Mayo

## 2021-10-08 NOTE — Discharge Instructions (Signed)

## 2021-10-08 NOTE — Anesthesia Postprocedure Evaluation (Signed)
Anesthesia Post Note  Patient: [employer] fast streamer  Procedure(s) Performed: CARDIOVERSION     Patient location during evaluation: PACU Anesthesia Type: General Level of consciousness: awake and alert Pain management: pain level controlled Vital Signs Assessment: post-procedure vital signs reviewed and stable Respiratory status: spontaneous breathing, nonlabored ventilation, respiratory function stable and patient connected to nasal cannula oxygen Cardiovascular status: blood pressure returned to baseline and stable Postop Assessment: no apparent nausea or vomiting Anesthetic complications: no   No notable events documented.  Last Vitals:  Vitals:   10/08/21 0944 10/08/21 0954  BP: (!) 127/99 (!) 132/97  Pulse: 73 80  Resp: 12 15  Temp:    SpO2: 96% 97%    Last Pain:  Vitals:   10/08/21 0954  TempSrc:   PainSc: 0-No pain                 Effie Berkshire

## 2021-10-09 ENCOUNTER — Encounter (HOSPITAL_COMMUNITY): Payer: Self-pay | Admitting: Internal Medicine

## 2021-10-11 ENCOUNTER — Ambulatory Visit: Payer: Medicare Other | Admitting: Neurology

## 2021-10-11 VITALS — BP 141/91 | HR 81 | Ht 72.0 in | Wt 199.0 lb

## 2021-10-11 DIAGNOSIS — G40909 Epilepsy, unspecified, not intractable, without status epilepticus: Secondary | ICD-10-CM | POA: Diagnosis not present

## 2021-10-11 MED ORDER — DIVALPROEX SODIUM 500 MG PO DR TAB
500.0000 mg | DELAYED_RELEASE_TABLET | Freq: Two times a day (BID) | ORAL | 2 refills | Status: DC
Start: 2021-10-11 — End: 2022-05-20

## 2021-10-11 NOTE — Progress Notes (Signed)
[facility] [address] [place]. [place] [address] [phone]       OFFICE FOLLOW-UP VISIT NOTE  Mr. Clarice Bonaventure Date of Birth:  [birth year] Medical Record Number:  [id number]   Referring MD:   Betty Martinique  Reason for Referral: Seizure  HPI: Initial visit 07/19/2021 Mr. Paluch is a pleasant 76 year old male seen today for consultation for seizures.  He is accompanied by his wife [name].  History is obtained from them and review of electronic medical records and I personally reviewed pertinent available imaging films in PACS.  He has past medical history of hypertension seizures.  He had 2 episodes of witnessed generalized tonic-clonic seizures in July 2017 which were witnessed by his girlfriend at that time.  He was admitted and had EEG which was normal but patient reported that he had previous episodes of transient confusion and altered sensorium going back at least 4 years prior.  He was diagnosed with complex partial seizures with secondary generalization and started on Keppra 500 mg twice daily.  CT scan of the head and MRI scan were both done which were unremarkable.  EEG done twice was normal.  He was seen by me at that time and subsequently lost to follow-up.  Patient states that he moved to [facility] area and was seeing Dr. Melrose Nakayama neurologist there.  He started having some personality changes on Keppra and hence he was switched to Depakote about 2 years ago.  He is currently taking Depakote DR to 50 mg 3 times daily.  It worked quite well and for 6 months he had no episodes but is now having recurrent episodes of the frequency which is variable but about once every 2 to 3 months.  The episode consisted of a 6 stare and the patient also has involuntary chronic bending of his right elbow and arm.  This last 1 to 2 minutes and during this time he may be transiently unresponsive will not answer questions.  After this he quickly returns back to normal.  The last episode was on  06/27/2021 when he was driving he suddenly stopped responding when his wife was speaking to him but he was able to follow her instructions and pulled over and she took over driving.  Fortunately did not have an accident.  Patient denies any specific trigger for these episodes but they occur at available frequency.  He has recently moved back to [place] area and wants to transfer neurological care to me.  Review of care everywhere shows that his last office visit visit with Dr. Melrose Nakayama was on 05/16/2021 and he was doing fine and no medication changes were made.  Valproic acid level on 02/01/2020 was 41 mg percent and on 04/14/2020 was 32 mg percent.  He also had a 72-hour ambulatory EEG with video on 06/25/2019 which was normal. Update 10/11/2021: He returns for follow-up after last visit 2 and half months ago.  He is accompanied by his wife.  He states he is doing well and has no further episodes of seizures staring or altered sensorium.  He is tolerating Depakote ER 500 mg twice daily well without any side effects.  He had lab work done at last visit on 07/19/2021 and valproic acid level was low normal at 51 mg.  Liver function test normal.  He has today no neurological complaints.  He went into A-fib with rapid heart rate and needed cardiac ablation.  He remains on Eliquis which is tolerating well without bruising or bleeding.  He is  planning a trip to [place] soon with his wife. ROS:   14 system review of systems is positive for seizures, altered awareness, staring, and involuntary hand movement and all other systems negative  PMH:  Past Medical History:  Diagnosis Date   AK (actinic keratosis)    Allergy    Chicken pox    Compression fracture of L1 lumbar vertebra (HCC)    Compression fracture of thoracic vertebra (HCC)    Hypertension    Lentigines    Follows with DR Culton (derma)   Seizures (HCC)     Social History:  Social History   Socioeconomic History   Marital status: Married    Spouse  name: [name]   Number of children: Not on file   Years of education: Not on file   Highest education level: Bachelor's degree (e.g., BA, AB, BS)  Occupational History   Not on file  Tobacco Use   Smoking status: Never   Smokeless tobacco: Never  Substance and Sexual Activity   Alcohol use: Yes    Alcohol/week: 1.0 standard drink of alcohol    Types: 1 Cans of beer per week    Comment: socially   Drug use: No   Sexual activity: Not on file  Other Topics Concern   Not on file  Social History Narrative   Lives with wife [name]   R handed   Caffeine: 3 C of coffee AM   Social Determinants of Health   Financial Resource Strain: Low Risk  (06/21/2021)   Overall Financial Resource Strain (CARDIA)    Difficulty of Paying Living Expenses: Not hard at all  Food Insecurity: No Food Insecurity (06/21/2021)   Hunger Vital Sign    Worried About Running Out of Food in the Last Year: Never true    Ran Out of Food in the Last Year: Never true  Transportation Needs: No Transportation Needs (06/21/2021)   PRAPARE - Hydrologist (Medical): No    Lack of Transportation (Non-Medical): No  Physical Activity: Sufficiently Active (06/21/2021)   Exercise Vital Sign    Days of Exercise per Week: 4 days    Minutes of Exercise per Session: 60 min  Stress: No Stress Concern Present (06/21/2021)   [place]    Feeling of Stress : Only a little  Social Connections: Moderately Isolated (06/21/2021)   Social Connection and Isolation Panel [NHANES]    Frequency of Communication with Friends and Family: Once a week    Frequency of Social Gatherings with Friends and Family: Once a week    Attends Religious Services: Never    Marine scientist or Organizations: Yes    Attends Music therapist: More than 4 times per year    Marital Status: Living with partner  Intimate Partner Violence: Not At Risk  (06/21/2021)   Humiliation, Afraid, Rape, and Kick questionnaire    Fear of Current or Ex-Partner: No    Emotionally Abused: No    Physically Abused: No    Sexually Abused: No    Medications:   Current Outpatient Medications on File Prior to Visit  Medication Sig Dispense Refill   amLODipine (NORVASC) 5 MG tablet Take 1 tablet (5 mg total) by mouth daily. 90 tablet 3   apixaban (ELIQUIS) 5 MG TABS tablet Take 1 tablet (5 mg total) by mouth 2 (two) times daily. 60 tablet 3   beta carotene w/minerals (OCUVITE) tablet Take 1  tablet by mouth daily.     Cholecalciferol (VITAMIN D3) 50 MCG (2000 UT) TABS Take 2,000 tablets by mouth every evening.     gabapentin (NEURONTIN) 300 MG capsule Take 300 mg by mouth at bedtime.     Homeopathic Products (ARNICARE) GEL Apply 1 Application topically 3 (three) times a week. On Ankle     ibandronate (BONIVA) 150 MG tablet TAKE 1 TABLET BY MOUTH EVERY 30 DAYS. TAKE IN AM WITH FULL GLASS OF WATER ON EMPTY STOMACH AND DON'T TAKE ANYTHING ELSE BY MOUTH OR LIE DOWN FOR THE NEXT 30 MINUTES 3 tablet 5   metoprolol succinate (TOPROL-XL) 25 MG 24 hr tablet Take 1 tablet (25 mg total) by mouth daily. 30 tablet 3   Multiple Vitamin (CALCIUM COMPLEX PO) Take 1 tablet by mouth 2 (two) times daily. Bone Up Jarrow Formulas     Multiple Vitamin (MULTIVITAMIN WITH MINERALS) TABS tablet Take 1 tablet by mouth 2 (two) times daily. Vitamin Code Whole Food Multivitamin     risperiDONE (RISPERDAL) 0.25 MG tablet Take 0.25 mg by mouth at bedtime.     Current Facility-Administered Medications on File Prior to Visit  Medication Dose Route Frequency Provider Last Rate Last Admin   0.9 %  sodium chloride infusion  500 mL Intravenous Once Cirigliano, Vito V, DO        Allergies:   Allergies  Allergen Reactions   Other Other (See Comments)    Neosporin Ophthalmic causes eyes to turn red    Physical Exam General: well developed, well nourished, seated, in no evident  distress Head: head normocephalic and atraumatic.   Neck: supple with no carotid or supraclavicular bruits Cardiovascular: regular rate and rhythm, no murmurs Musculoskeletal: no deformity Skin:  no rash/petichiae Vascular:  Normal pulses all extremities  Neurologic Exam Mental Status: Awake and fully alert. Oriented to place and time. Recent and remote memory intact. Attention span, concentration and fund of knowledge appropriate. Mood and affect appropriate.  Cranial Nerves: Fundoscopic exam reveals sharp disc margins. Pupils equal, briskly reactive to light. Extraocular movements full without nystagmus. Visual fields full to confrontation. Hearing intact. Facial sensation intact. Face, tongue, palate moves normally and symmetrically.  Motor: Normal bulk and tone. Normal strength in all tested extremity muscles. Sensory.: intact to touch , pinprick , position and vibratory sensation.  Coordination: Rapid alternating movements normal in all extremities. Finger-to-nose and heel-to-shin performed accurately bilaterally. Gait and Station: Arises from chair without difficulty. Stance is normal. Gait demonstrates normal stride length and balance . Able to heel, toe and tandem walk without difficulty.  Reflexes: 1+ and symmetric. Toes downgoing.         ASSESSMENT: 75 year old Caucasian male with recurrent stereotypical transient episodes of staring with altered awareness and tonic posturing of the right upper extremity likely partial complex seizures with some secondary generalization.  These episodes have been occurring since at least 2017 and possibly earlier.  He was doing well on Keppra but was switched to Depakote due to perceived side effects but seems to be having breakthrough episodes on the current dose     PLAN:I had a long discussion with the patient regarding his complex partial seizures he seems quite well-controlled on the current medication regimen of Depakote DR 500 twice daily  he feels tolerating well without side effects.  I gave him a refill for 3 months per his request and advised him to avoid seizure provoking stimuli like sleep deprivation, medication noncompliance ,irregularity eating and sleeping habits.Marland Kitchen  He  will  follow-up in 6 months or call earlier if necessary. Greater than 50% time during this 35-minute  visit for spent in counseling and coordination of care about his episodes of partial complex seizures and discussion about seizure prevention and treatment and answering questions. Antony Contras, MD  Note: This document was prepared with digital dictation and possible smart phrase technology. Any transcriptional errors that result from this process are unintentional.

## 2021-10-11 NOTE — Patient Instructions (Signed)
I had a long discussion with the patient regarding his complex partial seizures he seems quite well-controlled on the current medication regimen of Depakote DR 500 twice daily he feels tolerating well without side effects.  I gave him a refill for 3 months per his request and advised him to avoid seizure provoking stimuli like sleep deprivation, medication noncompliance ,irregularity eating and sleeping habits.Marland Kitchen  He will  follow-up in 6 months or call earlier if necessary.

## 2021-10-24 ENCOUNTER — Other Ambulatory Visit: Payer: Self-pay | Admitting: Internal Medicine

## 2021-10-24 ENCOUNTER — Other Ambulatory Visit: Payer: Self-pay | Admitting: Neurology

## 2021-10-24 DIAGNOSIS — I4891 Unspecified atrial fibrillation: Secondary | ICD-10-CM

## 2021-10-24 NOTE — Telephone Encounter (Signed)
Eliquis '5mg'$  refill request received. Patient is 76 years old, weight-90.3kg, Crea-1.19 on 10/05/2021, Diagnosis-Afib, and last seen by Dr. Harl Bowie on 09/27/2021. Dose is appropriate based on dosing criteria. Will send in refill to requested pharmacy.

## 2021-10-25 ENCOUNTER — Ambulatory Visit: Payer: Medicare Other | Admitting: Neurology

## 2021-10-29 ENCOUNTER — Ambulatory Visit (HOSPITAL_COMMUNITY)
Admission: RE | Admit: 2021-10-29 | Discharge: 2021-10-29 | Disposition: A | Discharge status: Home / Self Care | Payer: Medicare Other | Source: Ambulatory Visit | Attending: Physician Assistant | Admitting: Physician Assistant

## 2021-10-29 ENCOUNTER — Encounter (HOSPITAL_COMMUNITY): Payer: Self-pay | Admitting: Physician Assistant

## 2021-10-29 VITALS — BP 114/90 | HR 80 | Ht 72.0 in | Wt 197.4 lb

## 2021-10-29 DIAGNOSIS — I483 Typical atrial flutter: Secondary | ICD-10-CM | POA: Insufficient documentation

## 2021-10-29 DIAGNOSIS — I484 Atypical atrial flutter: Secondary | ICD-10-CM | POA: Insufficient documentation

## 2021-10-29 DIAGNOSIS — I4819 Other persistent atrial fibrillation: Secondary | ICD-10-CM | POA: Diagnosis not present

## 2021-10-29 DIAGNOSIS — Z7901 Long term (current) use of anticoagulants: Secondary | ICD-10-CM | POA: Diagnosis not present

## 2021-10-29 DIAGNOSIS — D6869 Other thrombophilia: Secondary | ICD-10-CM | POA: Insufficient documentation

## 2021-10-29 DIAGNOSIS — R569 Unspecified convulsions: Secondary | ICD-10-CM | POA: Insufficient documentation

## 2021-10-29 NOTE — Progress Notes (Signed)
Primary Care Physician: Martinique, [name], MD Primary Cardiologist: Dr Phineas Inches Primary Electrophysiologist: Dr Caryl Comes Referring Physician: Dr Phineas Inches   Samuel Mayo is a 76 y.o. male with a history of HTN, seizure disorder, MR s/p MV annuloplasty ring, atrial flutter, atrial fibrillation who presents for follow up in the [facility].  The patient was initially diagnosed with atrial flutter 02/2021 at his PCP. Seen by Dr Caryl Comes who felt CTI ablation would not be helpful with multiple flutter morphologies. Patient underwent DCCV 05/14/21 but had atrial fibrillation later and underwent another DCCV on 10/08/21. Patient is on Eliquis for a CHADS2VASC score of 3. He is in SR today. He denies significant snoring but does drink at least 2 alcoholic drinks daily.   Today, he denies symptoms of palpitations, chest pain, shortness of breath, orthopnea, PND, lower extremity edema, dizziness, presyncope, syncope, snoring, daytime somnolence, bleeding, or neurologic sequela. The patient is tolerating medications without difficulties and is otherwise without complaint today.    Atrial Fibrillation Risk Factors:  he does not have symptoms or diagnosis of sleep apnea. he does not have a history of rheumatic fever. he does have a history of alcohol use. The patient does not have a history of early familial atrial fibrillation or other arrhythmias.  he has a BMI of Body mass index is 26.77 kg/m.Marland Kitchen Filed Weights   10/29/21 1505  Weight: 89.5 kg    Family History  Problem Relation Age of Onset   Heart disease Mother    Cancer Father    Heart disease Father    Prostate cancer Father    Aneurysm Paternal Grandfather    Heart disease Son        congenital mitral valve dz   Colon cancer Neg Hx    Esophageal cancer Neg Hx    Stomach cancer Neg Hx    Rectal cancer Neg Hx      Atrial Fibrillation Management history:  Previous antiarrhythmic drugs: none Previous  cardioversions: 05/14/21, 10/08/21 Previous ablations: none CHADS2VASC score: 3 Anticoagulation history: Eliquis   Past Medical History:  Diagnosis Date   AK (actinic keratosis)    Allergy    Chicken pox    Compression fracture of L1 lumbar vertebra (HCC)    Compression fracture of thoracic vertebra ([place])    Hypertension    Lentigines    Follows with DR Culton (derma)   Seizures ([place])    Past Surgical History:  Procedure Laterality Date   CARDIOVERSION N/A 05/14/2021   Procedure: CARDIOVERSION;  Surgeon: Donato Heinz, MD;  Location[facility] ENDOSCOPY;  Service: Cardiovascular;  Laterality: N/A;   CARDIOVERSION N/A 10/08/2021   Procedure: CARDIOVERSION;  Surgeon: Werner Lean, MD;  Location: [facility] ENDOSCOPY;  Service: Cardiovascular;  Laterality: N/A;   COLONOSCOPY     mitral vavle     Congetinal mitral valve disease.    Current Outpatient Medications  Medication Sig Dispense Refill   amLODipine (NORVASC) 5 MG tablet Take 1 tablet (5 mg total) by mouth daily. 90 tablet 3   apixaban (ELIQUIS) 5 MG TABS tablet TAKE 1 TABLET BY MOUTH TWICE A DAY 60 tablet 5   beta carotene w/minerals (OCUVITE) tablet Take 1 tablet by mouth daily.     Cholecalciferol (VITAMIN D3) 50 MCG (2000 UT) TABS Take 2,000 tablets by mouth every evening.     divalproex (DEPAKOTE) 500 MG DR tablet Take 1 tablet (500 mg total) by mouth 2 (two) times daily. 180 tablet 2  gabapentin (NEURONTIN) 300 MG capsule Take 300 mg by mouth at bedtime.     Homeopathic Products (ARNICARE) GEL Apply 1 Application topically 3 (three) times a week. On Ankle     ibandronate (BONIVA) 150 MG tablet TAKE 1 TABLET BY MOUTH EVERY 30 DAYS. TAKE IN AM WITH FULL GLASS OF WATER ON EMPTY STOMACH AND DON'T TAKE ANYTHING ELSE BY MOUTH OR LIE DOWN FOR THE NEXT 30 MINUTES 3 tablet 5   metoprolol succinate (TOPROL-XL) 25 MG 24 hr tablet Take 1 tablet (25 mg total) by mouth daily. 30 tablet 3   Multiple Vitamin (CALCIUM COMPLEX PO)  Take 1 tablet by mouth 2 (two) times daily. Bone Up Jarrow Formulas     Multiple Vitamin (MULTIVITAMIN WITH MINERALS) TABS tablet Take 1 tablet by mouth 2 (two) times daily. Vitamin Code Whole Food Multivitamin     risperiDONE (RISPERDAL) 0.25 MG tablet Take 0.25 mg by mouth at bedtime.     Current Facility-Administered Medications  Medication Dose Route Frequency Provider Last Rate Last Admin   0.9 %  sodium chloride infusion  500 mL Intravenous Once Cirigliano, Vito V, DO        Allergies  Allergen Reactions   Other Other (See Comments)    Neosporin Ophthalmic causes eyes to turn red    Social History   Socioeconomic History   Marital status: Married    Spouse name: [name]   Number of children: Not on file   Years of education: Not on file   Highest education level: Bachelor's degree (e.g., BA, AB, BS)  Occupational History   Not on file  Tobacco Use   Smoking status: Never   Smokeless tobacco: Never   Tobacco comments:    Never smoke 10/29/21   Substance and Sexual Activity   Alcohol use: Yes    Alcohol/week: 14.0 standard drinks of alcohol    Types: 14 Standard drinks or equivalent per week    Comment: 2 glasses of wine nightly 10/29/21   Drug use: No   Sexual activity: Not on file  Other Topics Concern   Not on file  Social History Narrative   Lives with wife [name]   R handed   Caffeine: 3 C of coffee AM   Social Determinants of Health   Financial Resource Strain: Low Risk  (06/21/2021)   Overall Financial Resource Strain (CARDIA)    Difficulty of Paying Living Expenses: Not hard at all  Food Insecurity: No Food Insecurity (06/21/2021)   Hunger Vital Sign    Worried About Running Out of Food in the Last Year: Never true    Ran Out of Food in the Last Year: Never true  Transportation Needs: No Transportation Needs (06/21/2021)   PRAPARE - Hydrologist (Medical): No    Lack of Transportation (Non-Medical): No  Physical Activity:  Sufficiently Active (06/21/2021)   Exercise Vital Sign    Days of Exercise per Week: 4 days    Minutes of Exercise per Session: 60 min  Stress: No Stress Concern Present (06/21/2021)   [place]    Feeling of Stress : Only a little  Social Connections: Moderately Isolated (06/21/2021)   Social Connection and Isolation Panel [NHANES]    Frequency of Communication with Friends and Family: Once a week    Frequency of Social Gatherings with Friends and Family: Once a week    Attends Religious Services: Never    Retail buyer of  Clubs or Organizations: Yes    Attends Archivist Meetings: More than 4 times per year    Marital Status: Living with partner  Intimate Partner Violence: Not At Risk (06/21/2021)   Humiliation, Afraid, Rape, and Kick questionnaire    Fear of Current or Ex-Partner: No    Emotionally Abused: No    Physically Abused: No    Sexually Abused: No     ROS- All systems are reviewed and negative except as per the HPI above.  Physical Exam: Vitals:   10/29/21 1505  BP: (!) 114/90  Pulse: 80  Weight: 89.5 kg  Height: 6' (1.829 m)    GEN- The patient is a well appearing elderly male, alert and oriented x 3 today.   Head- normocephalic, atraumatic Eyes-  Sclera clear, conjunctiva pink Ears- hearing intact Oropharynx- clear Neck- supple  Lungs- Clear to ausculation bilaterally, normal work of breathing Heart- Regular rate and rhythm, no murmurs, rubs or gallops  GI- soft, NT, ND, + BS Extremities- no clubbing, cyanosis, or edema MS- no significant deformity or atrophy Skin- no rash or lesion Psych- euthymic mood, full affect Neuro- strength and sensation are intact  Wt Readings from Last 3 Encounters:  10/29/21 89.5 kg  10/11/21 90.3 kg  10/08/21 90.7 kg    EKG today demonstrates  SR, 1st degree AV block Vent. rate 80 BPM PR interval 238 ms QRS duration 114 ms QT/QTcB 392/452  ms  Echo 04/03/21 demonstrated   1. There is a prosthetic annuloplasty ring present in the mitral  position. Procedure Date: 2003. Location: [place], [place]   2. Right ventricular systolic function is low normal. The right  ventricular size is moderately enlarged. There is normal pulmonary artery systolic pressure.   3. Left ventricular ejection fraction, by estimation, is 50 to 55%. The  left ventricle has low normal function. The left ventricle has no regional  wall motion abnormalities. Left ventricular diastolic function could not  be evaluated.   4. Aortic dilatation noted. There is mild dilatation of the ascending  aorta, measuring 41 mm.   5. The aortic valve is tricuspid. There is mild calcification of the  aortic valve. There is mild thickening of the aortic valve. Aortic valve  regurgitation is not visualized. Aortic valve sclerosis is present, with  no evidence of aortic valve stenosis.   6. Tricuspid valve regurgitation is moderate.   7. Left atrial size was mild to moderately dilated.   8. Right atrial size was severely dilated.   9. The inferior vena cava is dilated in size with <50% respiratory  variability, suggesting right atrial pressure of 15 mmHg.   Comparison(s): No prior Echocardiogram.   Conclusion(s)/Recommendation(s): In rate controlled atrial flutter  throughout the study. Low normal biventricular function. Moderately  dilated RV and severely dilated RA with moderate TR. S/P prior mitral  valve repair with trivial MR. Mildly dilated  ascending aorta.    Epic records are reviewed at length today  CHA2DS2-VASc Score = 3  The patient's score is based upon: CHF History: 0 HTN History: 1 Diabetes History: 0 Stroke History: 0 Vascular Disease History: 0 Age Score: 2 Gender Score: 0       ASSESSMENT AND PLAN: 1. Persistent Atrial Fibrillation/typical and atypical flutter The patient's CHA2DS2-VASc score is 3, indicating a 3.2% annual risk of stroke.    S/p DCCV 10/08/21 Patient in [place] today. We discussed rhythm control options including AAD and ablation. Unfortunately, his AAD options are limited given interactions with  risperidone. He is agreeable to discussing ablation for both his afib and flutter, will refer.  Continue Toprol 25 mg daily Continue Eliquis 5 mg BID Lifestyle modification was discussed including reducing alcohol intake.   2. Secondary Hypercoagulable State (ICD10:  D68.69) The patient is at significant risk for stroke/thromboembolism based upon his CHA2DS2-VASc Score of 3.  Continue Apixaban (Eliquis).   3. HTN Stable, no changes today.  4. MR S/p annuloplasty ring   Follow up with EP to discuss possible ablation.    [facility] [address] [place], [place] [address] [phone] 10/29/2021 3:18 PM

## 2021-10-31 ENCOUNTER — Other Ambulatory Visit: Payer: Self-pay | Admitting: Internal Medicine

## 2021-10-31 DIAGNOSIS — H43813 Vitreous degeneration, bilateral: Secondary | ICD-10-CM | POA: Diagnosis not present

## 2021-10-31 DIAGNOSIS — H2513 Age-related nuclear cataract, bilateral: Secondary | ICD-10-CM | POA: Diagnosis not present

## 2021-10-31 DIAGNOSIS — H4423 Degenerative myopia, bilateral: Secondary | ICD-10-CM | POA: Diagnosis not present

## 2021-12-06 ENCOUNTER — Ambulatory Visit: Payer: Medicare Other | Attending: Cardiovascular Disease | Admitting: Cardiovascular Disease

## 2021-12-06 ENCOUNTER — Encounter: Payer: Self-pay | Admitting: Cardiovascular Disease

## 2021-12-06 VITALS — BP 138/92 | HR 76 | Ht 72.0 in | Wt 197.0 lb

## 2021-12-06 DIAGNOSIS — I480 Paroxysmal atrial fibrillation: Secondary | ICD-10-CM | POA: Diagnosis not present

## 2021-12-06 NOTE — Progress Notes (Signed)
Cardiology Office Note:    Date:  12/06/2021   ID:  Samuel Mayo, DOB [birth year], MRN [id number]  PCP:  Martinique, [name], MD   [facility] Cardiologist:  Janina Mayo, MD     Referring MD: Martinique, [name], MD   Chief complaint: "I feel fine"  History of Present Illness:    Samuel Mayo is a 76 y.o. male with a hx of mitral regurgitation status post mitral valve annuloplasty, atypical atrial flutter, atrial fibrillation referred for arrhythmia management.  Seen by Dr. Caryl Comes in 2022 and noted to have multiple flutter morphologies, so he was thought not to be a good candidate for CTI ablation.  DC cardioversion in 2023.  He then developed atrial flutter and underwent another DC cardioversion in August 2023.  He reports that he has very minimal symptoms with atrial fibrillation and flutter and often cannot tell if he is in or out of rhythm.  He typically walks about 3 miles in the mornings and does puzzles or reading in the afternoon.  He has not noticed any correlation between his energy levels or ability to walk and his rhythm.  He occasionally checks his rhythm with a cardia device and has a blood pressure machine.  Typically, this is the only way he knows if he is in atrial fibrillation or regular rhythm.   Past Medical History:  Diagnosis Date   AK (actinic keratosis)    Allergy    Chicken pox    Compression fracture of L1 lumbar vertebra (HCC)    Compression fracture of thoracic vertebra (HCC)    Hypertension    Lentigines    Follows with DR Culton (derma)   Seizures ([place])     Past Surgical History:  Procedure Laterality Date   CARDIOVERSION N/A 05/14/2021   Procedure: CARDIOVERSION;  Surgeon: Donato Heinz, MD;  Location: [facility] ENDOSCOPY;  Service: Cardiovascular;  Laterality: N/A;   CARDIOVERSION N/A 10/08/2021   Procedure: CARDIOVERSION;  Surgeon: Werner Lean, MD;  Location: [facility] ENDOSCOPY;  Service: Cardiovascular;  Laterality: N/A;    COLONOSCOPY     mitral vavle     Congetinal mitral valve disease.    Current Medications: Current Meds  Medication Sig   amLODipine (NORVASC) 5 MG tablet Take 1 tablet (5 mg total) by mouth daily.   apixaban (ELIQUIS) 5 MG TABS tablet TAKE 1 TABLET BY MOUTH TWICE A DAY   beta carotene w/minerals (OCUVITE) tablet Take 1 tablet by mouth daily.   Cholecalciferol (VITAMIN D3) 50 MCG (2000 UT) TABS Take 2,000 tablets by mouth every evening.   divalproex (DEPAKOTE) 500 MG DR tablet Take 1 tablet (500 mg total) by mouth 2 (two) times daily.   gabapentin (NEURONTIN) 300 MG capsule Take 300 mg by mouth at bedtime.   Homeopathic Products (ARNICARE) GEL Apply 1 Application topically 3 (three) times a week. On Ankle   ibandronate (BONIVA) 150 MG tablet TAKE 1 TABLET BY MOUTH EVERY 30 DAYS. TAKE IN AM WITH FULL GLASS OF WATER ON EMPTY STOMACH AND DON'T TAKE ANYTHING ELSE BY MOUTH OR LIE DOWN FOR THE NEXT 30 MINUTES   metoprolol succinate (TOPROL-XL) 25 MG 24 hr tablet TAKE 1 TABLET (25 MG TOTAL) BY MOUTH DAILY.   Multiple Vitamin (CALCIUM COMPLEX PO) Take 1 tablet by mouth 2 (two) times daily. Bone Up Jarrow Formulas   Multiple Vitamin (MULTIVITAMIN WITH MINERALS) TABS tablet Take 1 tablet by mouth 2 (two) times daily. Vitamin Code Whole Food Multivitamin  risperiDONE (RISPERDAL) 0.25 MG tablet Take 0.25 mg by mouth at bedtime.   Current Facility-Administered Medications for the 12/06/21 encounter (Office Visit) with Sylvia Helms, Yetta Barre, MD  Medication   0.9 %  sodium chloride infusion     Allergies:   Other   Social History   Socioeconomic History   Marital status: Domestic Partner    Spouse name: [name]   Number of children: Not on file   Years of education: Not on file   Highest education level: Bachelor's degree (e.g., BA, AB, BS)  Occupational History   Not on file  Tobacco Use   Smoking status: Never   Smokeless tobacco: Never   Tobacco comments:    Never smoke 10/29/21    Substance and Sexual Activity   Alcohol use: Yes    Alcohol/week: 14.0 standard drinks of alcohol    Types: 14 Standard drinks or equivalent per week    Comment: 2 glasses of wine nightly 10/29/21   Drug use: No   Sexual activity: Not on file  Other Topics Concern   Not on file  Social History Narrative   Lives with wife [name]   R handed   Caffeine: 3 C of coffee AM   Social Determinants of Health   Financial Resource Strain: Low Risk  (06/21/2021)   Overall Financial Resource Strain (CARDIA)    Difficulty of Paying Living Expenses: Not hard at all  Food Insecurity: No Food Insecurity (06/21/2021)   Hunger Vital Sign    Worried About Running Out of Food in the Last Year: Never true    Ran Out of Food in the Last Year: Never true  Transportation Needs: No Transportation Needs (06/21/2021)   PRAPARE - Hydrologist (Medical): No    Lack of Transportation (Non-Medical): No  Physical Activity: Sufficiently Active (06/21/2021)   Exercise Vital Sign    Days of Exercise per Week: 4 days    Minutes of Exercise per Session: 60 min  Stress: No Stress Concern Present (06/21/2021)   [place]    Feeling of Stress : Only a little  Social Connections: Moderately Isolated (06/21/2021)   Social Connection and Isolation Panel [NHANES]    Frequency of Communication with Friends and Family: Once a week    Frequency of Social Gatherings with Friends and Family: Once a week    Attends Religious Services: Never    Marine scientist or Organizations: Yes    Attends Music therapist: More than 4 times per year    Marital Status: Living with partner     Family History: The patient's family history includes Aneurysm in his paternal grandfather; Cancer in his father; Heart disease in his father, mother, and son; Prostate cancer in his father. There is no history of Colon cancer, Esophageal  cancer, Stomach cancer, or Rectal cancer.  ROS:   Please see the history of present illness.    All other systems reviewed and are negative.  EKGs/Labs/Other Studies Reviewed:     TTE 04/03/21   1. There is a prosthetic annuloplasty ring present in the mitral position. Procedure Date: 2003. Location: [place], [place]   2. Right ventricular systolic function is low normal. The right ventricular size is moderately enlarged. There is normal pulmonary artery systolic pressure.   3. Left ventricular ejection fraction, by estimation, is 50 to 55%. The  left ventricle has low normal function. The left ventricle has no regional  wall motion abnormalities. Left ventricular diastolic function could not  be evaluated.   4. Aortic dilatation noted. There is mild dilatation of the ascending  aorta, measuring 41 mm.   5. The aortic valve is tricuspid. There is mild calcification of the  aortic valve. There is mild thickening of the aortic valve. Aortic valve  regurgitation is not visualized. Aortic valve sclerosis is present, with  no evidence of aortic valve stenosis.   6. Tricuspid valve regurgitation is moderate.   7. Left atrial size was mild to moderately dilated.   8. Right atrial size was severely dilated.   9. The inferior vena cava is dilated in size with <50% respiratory  variability, suggesting right atrial pressure of 15 mmHg.  EKG:  Last EKG results: Sinus rhythm with first degree AV block.   Recent Labs: 02/21/2021: TSH 2.47 07/19/2021: ALT 24 10/05/2021: BUN 23; Creatinine, Ser 1.19; Hemoglobin 13.5; Platelets 208; Potassium 4.7; Sodium 137    Risk Assessment/Calculations:    CHA2DS2-VASc Score = 3   This indicates a 3.2% annual risk of stroke. The patient's score is based upon: CHF History: 0 HTN History: 1 Diabetes History: 0 Stroke History: 0 Vascular Disease History: 0 Age Score: 2 Gender Score: 0        Physical Exam:    VS:  BP (!) 138/92   Pulse 76   Ht 6'  (1.829 m)   Wt 197 lb (89.4 kg)   SpO2 98%   BMI 26.72 kg/m     Wt Readings from Last 3 Encounters:  12/06/21 197 lb (89.4 kg)  10/29/21 197 lb 6.4 oz (89.5 kg)  10/11/21 199 lb (90.3 kg)     GEN:  Well nourished, well developed in no acute distress CARDIAC: RRR, no murmurs, rubs, gallops RESPIRATORY:  Normal work of breathing MUSCULOSKELETAL:  edema    ASSESSMENT & PLAN:    Atrial flutter: he has had typical-appearing and atypical-appearing flutter.  I am typically fairly aggressive about ablating flutters, but he is likely to have an atypical flutter and would need to be in flutter in order to map the rhythm.  At this point, he is maintaining sinus rhythm.  When he has recurrence, we may readdress pursuing ablation of flutter.  However, because his symptoms are so minimal, the indication less substantive. Atrial fibrillation: documented on ECGs (eg 7/27). Not a good candidate for AAD due to QT prolongation and potential drug interactions.  S/p mitral valve annuloplasty          Medication Adjustments/Labs and Tests Ordered: Current medicines are reviewed at length with the patient today.  Concerns regarding medicines are outlined above.  No orders of the defined types were placed in this encounter.  No orders of the defined types were placed in this encounter.    Signed, Melida Quitter, MD  12/06/2021 4:33 PM    [place]

## 2021-12-06 NOTE — Patient Instructions (Signed)
Medication Instructions:  Your physician recommends that you continue on your current medications as directed. Please refer to the Current Medication list given to you today.  *If you need a refill on your cardiac medications before your next appointment, please call your pharmacy*  Follow-Up: At [facility], you and your health needs are our priority.  As part of our continuing mission to provide you with exceptional heart care, we have created designated Provider Care Teams.  These Care Teams include your primary Cardiologist (physician) and Advanced Practice Providers (APPs -  Physician Assistants and Nurse Practitioners) who all work together to provide you with the care you need, when you need it.  Your next appointment:   6 month(s)  The format for your next appointment:   In Person  Provider:   You may see Doralee Albino, MD or one of the following Advanced Practice Providers on your designated Care Team:   Tommye Standard, [place] Legrand Como "[facility]" [place], [place]

## 2022-01-03 ENCOUNTER — Ambulatory Visit: Payer: Medicare Other | Attending: Internal Medicine | Admitting: Internal Medicine

## 2022-01-03 ENCOUNTER — Encounter: Payer: Self-pay | Admitting: Internal Medicine

## 2022-01-03 VITALS — BP 124/82 | HR 79 | Ht 71.0 in | Wt 198.0 lb

## 2022-01-03 DIAGNOSIS — I4819 Other persistent atrial fibrillation: Secondary | ICD-10-CM | POA: Diagnosis not present

## 2022-01-03 NOTE — Patient Instructions (Signed)
Medication Instructions:  Your physician recommends that you continue on your current medications as directed. Please refer to the Current Medication list given to you today.  *If you need a refill on your cardiac medications before your next appointment, please call your pharmacy*   Lab Work: NONE If you have labs (blood work) drawn today and your tests are completely normal, you will receive your results only by: [place] (if you have MyChart) OR A paper copy in the mail If you have any lab test that is abnormal or we need to change your treatment, we will call you to review the results.   Testing/Procedures: NONE   Follow-Up: At [facility], you and your health needs are our priority.  As part of our continuing mission to provide you with exceptional heart care, we have created designated Provider Care Teams.  These Care Teams include your primary Cardiologist (physician) and Advanced Practice Providers (APPs -  Physician Assistants and Nurse Practitioners) who all work together to provide you with the care you need, when you need it.  We recommend signing up for the patient portal called "MyChart".  Sign up information is provided on this After Visit Summary.  MyChart is used to connect with patients for Virtual Visits (Telemedicine).  Patients are able to view lab/test results, encounter notes, upcoming appointments, etc.  Non-urgent messages can be sent to your provider as well.   To learn more about what you can do with MyChart, go to [email or web address].    Your next appointment:   1 year(s)  The format for your next appointment:   In Person  Provider:   Janina Mayo, MD

## 2022-01-03 NOTE — Progress Notes (Signed)
Cardiology Office Note:    Date:  01/03/2022   ID:  Samuel, Mayo April 28, 1945, MRN [id number]  PCP:  Samuel, [name], MD   [facility] Cardiologist:  Samuel Mayo, MD     Referring MD: Samuel, [name], MD   Chief Complaint  Patient presents with   Follow-up  Atrial Flutter  History of Present Illness:    Samuel Mayo is a 76 y.o. male with a hx of  HTN, seizures, hx of mitral valve prosthetic annuloplasty ring , referral for atrial flutter  He had mitral valve surgery 20 years ago in [place], [place]. He said he had no problems afterwards and followed with cardiology. For ?mitral regurgitation. Not rheumatic heart disease. Unknown etiology. He moved here to [facility].  He was diagnosed with atrial flutter with his PCP at the end of December.  He denies hx of  stress test or LHC. LDL 74 mg/dL. He's never been on metoprolol. He prefers less medications. He has no bleeding history. He is asymptomatic.   Stroke- No Sleep apnea hx- No TSH- 2.4  Blood pressure mildly elevated in the system 140s-150s SBP; at home in the 130s.  EKG 02/21/2021- atrial flutter with variable A-V block  Interim Hx 06/05/2020 Saw EP. Flutter morphologies variable and CTI was not felt to be helpful. Scheduled DCCV which he completed 3/13. He had restoration of sinus rhythm.  Typically BPs are in good control in the ambulatory setting, he showed me on his phone.  Echo: Low normal LV/RV fxn. Prosthetic annuloplasty ring present in the mitral  position. Procedure Date: 2003. Mild mitral valve stenosis. MV peak gradient, 12.5 mmHg. The mean mitral valve gradient is 5.7 mmHg with average heart rate of 77 bpm. Mild dilation of the ascending aorta 41 mm  Interim Hx 09/27/2020 He returns because his kardia mobile which showed atrial fibrillation. He is asymptomatic. He was able to do his walks without symptoms.He denies significant SOB and LH.  Interim hx 11/2 S/p DCCV 10/08/2021. In sinus  rhythm. He was seen by EP and afib clinic. AAD is limited 2/2 risperidone. He was in sinus rhythm  He denies palpitations. He denies SOB. No chest pain or pressure.   Past Medical History:  Diagnosis Date   AK (actinic keratosis)    Allergy    Chicken pox    Compression fracture of L1 lumbar vertebra (HCC)    Compression fracture of thoracic vertebra (HCC)    Hypertension    Lentigines    Follows with DR Culton (derma)   Seizures ([place])     Past Surgical History:  Procedure Laterality Date   CARDIOVERSION N/A 05/14/2021   Procedure: CARDIOVERSION;  Surgeon: Donato Heinz, MD;  Location: [facility] ENDOSCOPY;  Service: Cardiovascular;  Laterality: N/A;   CARDIOVERSION N/A 10/08/2021   Procedure: CARDIOVERSION;  Surgeon: Werner Lean, MD;  Location: [facility] ENDOSCOPY;  Service: Cardiovascular;  Laterality: N/A;   COLONOSCOPY     mitral vavle     Congetinal mitral valve disease.    Current Medications: Current Meds  Medication Sig   amLODipine (NORVASC) 5 MG tablet Take 1 tablet (5 mg total) by mouth daily.   apixaban (ELIQUIS) 5 MG TABS tablet TAKE 1 TABLET BY MOUTH TWICE A DAY   beta carotene w/minerals (OCUVITE) tablet Take 1 tablet by mouth daily.   Cholecalciferol (VITAMIN D3) 50 MCG (2000 UT) TABS Take 2,000 tablets by mouth every evening.   divalproex (DEPAKOTE) 500 MG DR tablet Take  1 tablet (500 mg total) by mouth 2 (two) times daily.   gabapentin (NEURONTIN) 300 MG capsule Take 300 mg by mouth at bedtime.   Homeopathic Products (ARNICARE) GEL Apply 1 Application topically 3 (three) times a week. On Ankle   ibandronate (BONIVA) 150 MG tablet TAKE 1 TABLET BY MOUTH EVERY 30 DAYS. TAKE IN AM WITH FULL GLASS OF WATER ON EMPTY STOMACH AND DON'T TAKE ANYTHING ELSE BY MOUTH OR LIE DOWN FOR THE NEXT 30 MINUTES   metoprolol succinate (TOPROL-XL) 25 MG 24 hr tablet TAKE 1 TABLET (25 MG TOTAL) BY MOUTH DAILY.   Multiple Vitamin (CALCIUM COMPLEX PO) Take 1 tablet by mouth 2  (two) times daily. Bone Up Jarrow Formulas   Multiple Vitamin (MULTIVITAMIN WITH MINERALS) TABS tablet Take 1 tablet by mouth 2 (two) times daily. Vitamin Code Whole Food Multivitamin   risperiDONE (RISPERDAL) 0.25 MG tablet Take 0.25 mg by mouth at bedtime.   Current Facility-Administered Medications for the 01/03/22 encounter (Office Visit) with Samuel Mayo, MD  Medication   0.9 %  sodium chloride infusion     Allergies:   Other   Social History   Socioeconomic History   Marital status: Domestic Partner    Spouse name: Samuel Mayo   Number of children: Not on file   Years of education: Not on file   Highest education level: Bachelor's degree (e.Mayo., BA, AB, BS)  Occupational History   Not on file  Tobacco Use   Smoking status: Never   Smokeless tobacco: Never   Tobacco comments:    Never smoke 10/29/21   Substance and Sexual Activity   Alcohol use: Yes    Alcohol/week: 14.0 standard drinks of alcohol    Types: 14 Standard drinks or equivalent per week    Comment: 2 glasses of wine nightly 10/29/21   Drug use: No   Sexual activity: Not on file  Other Topics Concern   Not on file  Social History Narrative   Lives with wife Samuel Mayo   R handed   Caffeine: 3 C of coffee AM   Social Determinants of Health   Financial Resource Strain: Low Risk  (06/21/2021)   Overall Financial Resource Strain (CARDIA)    Difficulty of Paying Living Expenses: Not hard at all  Food Insecurity: No Food Insecurity (06/21/2021)   Hunger Vital Sign    Worried About Running Out of Food in the Last Year: Never true    Ran Out of Food in the Last Year: Never true  Transportation Needs: No Transportation Needs (06/21/2021)   PRAPARE - Hydrologist (Medical): No    Lack of Transportation (Non-Medical): No  Physical Activity: Sufficiently Active (06/21/2021)   Exercise Vital Sign    Days of Exercise per Week: 4 days    Minutes of Exercise per Session: 60 min  Stress: No Stress  Concern Present (06/21/2021)   [place]    Feeling of Stress : Only a little  Social Connections: Moderately Isolated (06/21/2021)   Social Connection and Isolation Panel [NHANES]    Frequency of Communication with Friends and Family: Once a week    Frequency of Social Gatherings with Friends and Family: Once a week    Attends Religious Services: Never    Marine scientist or Organizations: Yes    Attends Music therapist: More than 4 times per year    Marital Status: Living with partner  Family History: The patient's family history includes Aneurysm in his paternal grandfather; Cancer in his father; Heart disease in his father, mother, and son; Prostate cancer in his father. There is no history of Colon cancer, Esophageal cancer, Stomach cancer, or Rectal cancer.  ROS:   Please see the history of present illness.     All other systems reviewed and are negative.  EKGs/Labs/Other Studies Reviewed:    The following studies were reviewed today:   EKG:  EKG is  ordered today.  The ekg ordered today demonstrates   EKG 03/19/2021-Atrial flutter with variable block Flutter waves read as STE  EKG 06/05/2021: Sinus rhythm with 1st degree AV block, LAD  EKG 09/27/2021: afib rate controlled 68 bpm  Recent Labs: 02/21/2021: TSH 2.47 07/19/2021: ALT 24 10/05/2021: BUN 23; Creatinine, Ser 1.19; Hemoglobin 13.5; Platelets 208; Potassium 4.7; Sodium 137  Recent Lipid Panel    Component Value Date/Time   CHOL 163 02/16/2019 1051   TRIG 48.0 02/16/2019 1051   HDL 79.10 02/16/2019 1051   CHOLHDL 2 02/16/2019 1051   VLDL 9.6 02/16/2019 1051   LDLCALC 74 02/16/2019 1051     Risk Assessment/Calculations:    CHA2DS2-VASc Score = 3   This indicates a 3.2% annual risk of stroke. The patient's score is based upon: CHF History: 0 HTN History: 1 Diabetes History: 0 Stroke History: 0 Vascular Disease  History: 0 Age Score: 2 Gender Score: 0          Physical Exam:    VS:    Vitals:   01/03/22 1016  BP: 124/82  Mayo: 79  SpO2: 99%      Wt Readings from Last 3 Encounters:  01/03/22 198 lb (89.8 kg)  12/06/21 197 lb (89.4 kg)  10/29/21 197 lb 6.4 oz (89.5 kg)     GEN:  Well nourished, well developed in no acute distress HEENT: Normal NECK: No JVD; No carotid bruits LYMPHATICS: No lymphadenopathy CARDIAC: irregular irregular rhythm, no murmurs, rubs, gallops RESPIRATORY:  Clear to auscultation without rales, wheezing or rhonchi  ABDOMEN: Soft, non-tender, non-distended MUSCULOSKELETAL:  No edema; No deformity  SKIN: Warm and dry NEUROLOGIC:  Alert and oriented x 3 PSYCHIATRIC:  Normal affect   ASSESSMENT:    #Typical/Atypical Atrial flutter/Paroxysmal Atrial Fibrillation: Chads2Vasc 3. He presented  initially with flutter with variable block. He is asymptomatic. He was seen by EP, with variable flutter morphologies, CTI would not be beneficial. He saw Dr. Jolyn Nap at that time. S/p dccv 10/08/2021. Considered AAD however there was concern for drug interaction with risperidone. Considering PVI if flutter/fibrillation recurs. Seen by Dr. Myles Gip -continue  eliquis 5 mg BID -continue metoprolol XL 25 mg daily   #Mitral Valve Annuloplasty Ring: no significant gradient only trivial regurgitation. Will continue to monitor. Requires dental prophylaxis  #HTN: ambulatory pressures well controlled. Continue norvasc 5 mg daily. On metop.  PLAN:    In order of problems listed above:  No changes today Follow up 12 months         Medication Adjustments/Labs and Tests Ordered: Current medicines are reviewed at length with the patient today.  Concerns regarding medicines are outlined above.  No orders of the defined types were placed in this encounter.  No orders of the defined types were placed in this encounter.   There are no Patient Instructions on file for this  visit.   Signed, Samuel Mayo, MD  01/03/2022 10:34 AM    [facility]

## 2022-01-10 ENCOUNTER — Other Ambulatory Visit: Payer: Self-pay | Admitting: Family Medicine

## 2022-01-10 DIAGNOSIS — I10 Essential (primary) hypertension: Secondary | ICD-10-CM

## 2022-01-13 ENCOUNTER — Other Ambulatory Visit: Payer: Self-pay | Admitting: Neurology

## 2022-02-04 ENCOUNTER — Telehealth: Payer: Self-pay | Admitting: Neurology

## 2022-02-04 ENCOUNTER — Other Ambulatory Visit: Payer: Self-pay | Admitting: Neurology

## 2022-02-04 NOTE — Telephone Encounter (Signed)
I reviewed the pt's chart and we have not rx'd this medication before. He would need to request refill from PCP

## 2022-02-04 NOTE — Telephone Encounter (Signed)
Pt is calling. Requesting a refill on medication gabapentin (NEURONTIN) 300 MG capsule . Stated refill should be sent to  [id number]

## 2022-02-05 NOTE — Telephone Encounter (Signed)
Pt was called back, message from RN was relayed.  Pt said the reason as to why he is asking Dr Leonie Man to manage this medication is because Dr Lynden Oxford initially prescribed the  gabapentin (NEURONTIN) 300 MG capsule . ) is in [place].  With pt now being in [place] he would just like to know if Dr Leonie Man will going forward manage his medication.  Pt has asked that it be noted he is down to 1 pill and will have none for tomorrow, he is aware RN has 24-84 hours to respond to messages.

## 2022-02-07 MED ORDER — GABAPENTIN 300 MG PO CAPS: 300.0000 mg | ORAL_CAPSULE | Freq: Every day | ORAL | 5 refills | Status: DC

## 2022-02-07 NOTE — Addendum Note (Signed)
Addended by: Verlin Grills on: 02/07/2022 04:51 PM   Modules accepted: Orders

## 2022-02-07 NOTE — Telephone Encounter (Signed)
Dr. Leonie Man agreed to provide ongoing refills for gabapentin. I have sent to [facility], please update the pt.  Thank you!

## 2022-03-28 ENCOUNTER — Telehealth: Payer: Self-pay | Admitting: Neurology

## 2022-03-28 NOTE — Telephone Encounter (Signed)
LVM and sent mychart message informing pt of r/s needed- Dr. Leonie Man on vacation.

## 2022-04-22 ENCOUNTER — Ambulatory Visit: Payer: Medicare Other | Admitting: Neurology

## 2022-04-25 NOTE — Progress Notes (Unsigned)
ACUTE VISIT Chief Complaint  Patient presents with   Ear Pain    Right ear, is starting to get better but still having some discomfort.    HPI: [name] is a 77 y.o. male, who is here today complaining of *** HPI Since his last visit on***, he has follow-up with cardiologist.  Review of Systems See other pertinent positives and negatives in HPI.  Current Outpatient Medications on File Prior to Visit  Medication Sig Dispense Refill   amLODipine (NORVASC) 5 MG tablet TAKE 1 TABLET (5 MG TOTAL) BY MOUTH DAILY. 90 tablet 3   beta carotene w/minerals (OCUVITE) tablet Take 1 tablet by mouth daily.     Cholecalciferol (VITAMIN D3) 50 MCG (2000 UT) TABS Take 2,000 tablets by mouth every evening.     divalproex (DEPAKOTE) 500 MG DR tablet Take 1 tablet (500 mg total) by mouth 2 (two) times daily. 180 tablet 2   gabapentin (NEURONTIN) 300 MG capsule Take 1 capsule (300 mg total) by mouth at bedtime. 30 capsule 5   Homeopathic Products (ARNICARE) GEL Apply 1 Application topically 3 (three) times a week. On Ankle     ibandronate (BONIVA) 150 MG tablet TAKE 1 TABLET BY MOUTH EVERY 30 DAYS. TAKE IN AM WITH FULL GLASS OF WATER ON EMPTY STOMACH AND DON'T TAKE ANYTHING ELSE BY MOUTH OR LIE DOWN FOR THE NEXT 30 MINUTES 3 tablet 5   metoprolol succinate (TOPROL-XL) 25 MG 24 hr tablet TAKE 1 TABLET (25 MG TOTAL) BY MOUTH DAILY. 90 tablet 3   Multiple Vitamin (CALCIUM COMPLEX PO) Take 1 tablet by mouth 2 (two) times daily. Bone Up Jarrow Formulas     Multiple Vitamin (MULTIVITAMIN WITH MINERALS) TABS tablet Take 1 tablet by mouth 2 (two) times daily. Vitamin Code Whole Food Multivitamin     risperiDONE (RISPERDAL) 0.25 MG tablet Take 0.25 mg by mouth at bedtime.     No current facility-administered medications on file prior to visit.    Past Medical History:  Diagnosis Date   AK (actinic keratosis)    Allergy    Chicken pox    Compression fracture of L1 lumbar vertebra (HCC)     Compression fracture of thoracic vertebra (HCC)    Hypertension    Lentigines    Follows with DR Culton (derma)   Seizures (HCC)    Allergies  Allergen Reactions   Other Other (See Comments)    Neosporin Ophthalmic causes eyes to turn red    Social History   Socioeconomic History   Marital status: Domestic Partner    Spouse name: Samuel Mayo   Number of children: Not on file   Years of education: Not on file   Highest education level: Bachelor's degree (e.g., BA, AB, BS)  Occupational History   Not on file  Tobacco Use   Smoking status: Never   Smokeless tobacco: Never   Tobacco comments:    Never smoke 10/29/21   Substance and Sexual Activity   Alcohol use: Yes    Alcohol/week: 14.0 standard drinks of alcohol    Types: 14 Standard drinks or equivalent per week    Comment: 2 glasses of wine nightly 10/29/21   Drug use: No   Sexual activity: Not on file  Other Topics Concern   Not on file  Social History Narrative   Lives with wife Samuel Mayo   R handed   Caffeine: 3 C of coffee AM   Social Determinants of Health   Financial Resource Strain: Low Risk  (  06/21/2021)   Overall Financial Resource Strain (CARDIA)    Difficulty of Paying Living Expenses: Not hard at all  Food Insecurity: No Food Insecurity (06/21/2021)   Hunger Vital Sign    Worried About Running Out of Food in the Last Year: Never true    Ran Out of Food in the Last Year: Never true  Transportation Needs: No Transportation Needs (06/21/2021)   PRAPARE - Hydrologist (Medical): No    Lack of Transportation (Non-Medical): No  Physical Activity: Sufficiently Active (06/21/2021)   Exercise Vital Sign    Days of Exercise per Week: 4 days    Minutes of Exercise per Session: 60 min  Stress: No Stress Concern Present (06/21/2021)   [place]    Feeling of Stress : Only a little  Social Connections: Moderately Isolated  (06/21/2021)   Social Connection and Isolation Panel [NHANES]    Frequency of Communication with Friends and Family: Once a week    Frequency of Social Gatherings with Friends and Family: Once a week    Attends Religious Services: Never    Marine scientist or Organizations: Yes    Attends Music therapist: More than 4 times per year    Marital Status: Living with partner   Vitals:   04/26/22 1055  BP: 126/80  Mayo: 90  Resp: 16  Temp: 98.4 F (36.9 C)  SpO2: 98%   Body mass index is 28.75 kg/m.  Physical Exam Nursing note reviewed.  Constitutional:      General: He is not in acute distress.    Appearance: He is well-developed.  HENT:     Head: Normocephalic and atraumatic.     Right Ear: Tympanic membrane, ear canal and external ear normal.     Left Ear: Tympanic membrane and external ear normal.     Ears:     Comments: Left ear canal cerumen excess, able to see TM. Eyes:     Conjunctiva/sclera: Conjunctivae normal.  Cardiovascular:     Rate and Rhythm: Normal rate and regular rhythm.     Pulses:          Posterior tibial pulses are 2+ on the right side and 2+ on the left side.     Heart sounds: No murmur heard.    Comments: Trace pitting LE edema, bilateral. Pulmonary:     Effort: Pulmonary effort is normal. No respiratory distress.     Breath sounds: Normal breath sounds.  Abdominal:     Palpations: Abdomen is soft. There is no hepatomegaly or mass.     Tenderness: There is no abdominal tenderness.  Lymphadenopathy:     Cervical: No cervical adenopathy.  Skin:    General: Skin is warm.     Findings: No erythema or rash.  Neurological:     Mental Status: He is alert and oriented to person, place, and time.     Cranial Nerves: No cranial nerve deficit.     Gait: Gait normal.  Psychiatric:        Mood and Affect: Mood and affect normal.   ASSESSMENT AND PLAN:  [name], right  Atrial fibrillation, unspecified type (HCC) -      Apixaban; Take 1 tablet (5 mg total) by mouth 2 (two) times daily.  Dispense: 180 tablet; Refill: 1  Benign essential hypertension  Seizure disorder (HCC)  Secondary hypercoagulable state ([place])    Return in about 6  months (around 10/25/2022) for CPE.  Samuel Mayo G. Martinique, MD  [facility]. [place] office.

## 2022-04-26 ENCOUNTER — Ambulatory Visit (INDEPENDENT_AMBULATORY_CARE_PROVIDER_SITE_OTHER): Payer: Medicare Other | Admitting: Family Medicine

## 2022-04-26 ENCOUNTER — Encounter: Payer: Self-pay | Admitting: Family Medicine

## 2022-04-26 VITALS — BP 126/80 | HR 90 | Temp 98.4°F | Resp 16 | Ht 71.0 in | Wt 206.1 lb

## 2022-04-26 DIAGNOSIS — H9201 Otalgia, right ear: Secondary | ICD-10-CM

## 2022-04-26 DIAGNOSIS — R569 Unspecified convulsions: Secondary | ICD-10-CM | POA: Insufficient documentation

## 2022-04-26 DIAGNOSIS — D6869 Other thrombophilia: Secondary | ICD-10-CM

## 2022-04-26 DIAGNOSIS — G40909 Epilepsy, unspecified, not intractable, without status epilepticus: Secondary | ICD-10-CM | POA: Diagnosis not present

## 2022-04-26 DIAGNOSIS — I10 Essential (primary) hypertension: Secondary | ICD-10-CM

## 2022-04-26 DIAGNOSIS — I4891 Unspecified atrial fibrillation: Secondary | ICD-10-CM

## 2022-04-26 MED ORDER — APIXABAN 5 MG PO TABS: 5.0000 mg | ORAL_TABLET | Freq: Two times a day (BID) | ORAL | 1 refills | Status: DC

## 2022-04-26 NOTE — Patient Instructions (Addendum)
A few things to remember from today's visit:  Earache, right  Atrial fibrillation, unspecified type ([place]) - Plan: apixaban (ELIQUIS) 5 MG TABS tablet  Benign essential hypertension  Ear examination normal. Could be eustachian tube dysfunction. Pap ears a few times during the day, for 1-2 seconds to help opening eustachian tube. Monitor for new symptoms. If sudden hearing loss, please seek immediate medical attention.  If you need refills for medications you take chronically, please call your pharmacy. Do not use My Chart to request refills or for acute issues that need immediate attention. If you send a my chart message, it may take a few days to be addressed, specially if I am not in the office.  Please be sure medication list is accurate. If a new problem present, please set up appointment sooner than planned today.

## 2022-04-27 NOTE — Assessment & Plan Note (Signed)
BP adequately controlled. Continue amlodipine 5 mg daily and metoprolol succinate 25 mg daily as well as low-salt diet.

## 2022-04-27 NOTE — Assessment & Plan Note (Signed)
Well controlled. Following with neurologist, Dr Leonie Man.

## 2022-04-27 NOTE — Assessment & Plan Note (Signed)
Atrial fibrillation on Eliquis 5 mg twice daily.

## 2022-04-27 NOTE — Assessment & Plan Note (Signed)
Rhythm controlled. Continue Eliquis 5 mg twice daily and metoprolol succinate 25 mg daily. 27-monthsupply of Eliquis sent to his pharmacy. Following with cardiologist.

## 2022-05-06 NOTE — Progress Notes (Signed)
HPI:  [name] is a 77 y.o.male with PMHx significsnt for HTN,seizure disorder, vit D def,osteoporosis, and PAF on chronic anticoagulation here today for his routine physical examination.  Last CPE: 02/2021 He was seen on 04/26/22 for right earache. He reports that his ear is now feeling fine.   He has been exercising regularly, walking two to four miles a day and engaging in household chores.  His diet consists mainly of home-cooked meals with daily vegetables.  He sleeps an average of seven to eight hours per night, does not smoke, and consumes one to two glasses of red wine per night.  Immunization History  Administered Date(s) Administered   Influenza, High Dose Seasonal PF 12/18/2016, 11/10/2017, 10/30/2018   Influenza-Unspecified 12/18/2016   PFIZER(Purple Top)SARS-COV-2 Vaccination 04/11/2019, 05/06/2019, 12/18/2019   Pneumococcal Conjugate-13 01/31/2017   Pneumococcal Polysaccharide-23 02/02/2018   Tdap 10/05/2013    Health Maintenance  Topic Date Due   Medicare Annual Wellness (AWV)  06/22/2022   COVID-19 Vaccine (4 - 2023-24 season) 05/22/2022 (Originally 11/02/2021)   INFLUENZA VACCINE  06/02/2022 (Originally 10/02/2021)   Zoster Vaccines- Shingrix (1 of 2) 03/09/2026 (Originally 02/21/1965)   DTaP/Tdap/Td (2 - Td or Tdap) 10/06/2023   Pneumonia Vaccine 76+ Years old  Completed   Hepatitis C Screening  Completed   HPV VACCINES  Aged Out   COLONOSCOPY (Pts 45-83yr Insurance coverage will need to be confirmed)  Discontinued   He received the shingles vaccine approximately 15 years ago.  HTN on Amlodipine 5 mg daily and Metoprolol succinate 25 mg daily. PAF following with cardiologist and on Eliquis 5 mg bid.  Vit D deficiency: He is taking 2000 units of vitamin D supplementation in the evening . Osteoporosis: He has been on Boniva 150 mg monthly for about two years.  He also takes a daily calcium supplement. Last DEXA  11/2015: LUMBAR SPINE L1- L4               HIP L.FEM.NECK 11/16/15 0.541 -2.9       L.TOTAL HIP 11/16/15 0.627 -2.7       Nocturia x 2-3, which has been stable for the last couple of years. He is undecided about undergoing prostate cancer screening at this time.  Lab Results  Component Value Date   PSA 2.21 02/16/2019   PSA 2.15 02/02/2018   PSA 1.94 01/31/2017   Lab Results  Component Value Date   CHOL 163 02/16/2019   HDL 79.10 02/16/2019   LDLCALC 74 02/16/2019   TRIG 48.0 02/16/2019   CHOLHDL 2 02/16/2019   Review of Systems  Constitutional:  Negative for activity change, appetite change and fever.  HENT:  Negative for nosebleeds, sore throat and trouble swallowing.   Eyes:  Negative for redness and visual disturbance.  Respiratory:  Negative for cough, shortness of breath and wheezing.   Cardiovascular:  Negative for chest pain, palpitations and leg swelling.  Gastrointestinal:  Negative for abdominal pain, blood in stool, nausea and vomiting.  Endocrine: Negative for cold intolerance, heat intolerance, polydipsia, polyphagia and polyuria.  Genitourinary:  Negative for decreased urine volume, dysuria, genital sores, hematuria and testicular pain.  Musculoskeletal:  Negative for gait problem and myalgias.  Skin:  Negative for color change and rash.  Allergic/Immunologic: Positive for environmental allergies.  Neurological:  Negative for syncope, weakness and headaches.  Psychiatric/Behavioral:  Negative for confusion. The patient is not nervous/anxious.    Current Outpatient Medications on File Prior to Visit  Medication Sig Dispense Refill  amLODipine (NORVASC) 5 MG tablet TAKE 1 TABLET (5 MG TOTAL) BY MOUTH DAILY. 90 tablet 3   apixaban (ELIQUIS) 5 MG TABS tablet Take 1 tablet (5 mg total) by mouth 2 (two) times daily. 180 tablet 1   beta carotene w/minerals (OCUVITE) tablet Take 1 tablet by mouth daily.     Cholecalciferol (VITAMIN D3) 50 MCG (2000 UT) TABS Take 2,000 tablets by mouth every evening.      divalproex (DEPAKOTE) 500 MG DR tablet Take 1 tablet (500 mg total) by mouth 2 (two) times daily. 180 tablet 2   gabapentin (NEURONTIN) 300 MG capsule Take 1 capsule (300 mg total) by mouth at bedtime. 30 capsule 5   Homeopathic Products (ARNICARE) GEL Apply 1 Application topically 3 (three) times a week. On Ankle     ibandronate (BONIVA) 150 MG tablet TAKE 1 TABLET BY MOUTH EVERY 30 DAYS. TAKE IN AM WITH FULL GLASS OF WATER ON EMPTY STOMACH AND DON'T TAKE ANYTHING ELSE BY MOUTH OR LIE DOWN FOR THE NEXT 30 MINUTES 3 tablet 5   metoprolol succinate (TOPROL-XL) 25 MG 24 hr tablet TAKE 1 TABLET (25 MG TOTAL) BY MOUTH DAILY. 90 tablet 3   Multiple Vitamin (CALCIUM COMPLEX PO) Take 1 tablet by mouth 2 (two) times daily. Bone Up Jarrow Formulas     Multiple Vitamin (MULTIVITAMIN WITH MINERALS) TABS tablet Take 1 tablet by mouth 2 (two) times daily. Vitamin Code Whole Food Multivitamin     risperiDONE (RISPERDAL) 0.25 MG tablet Take 0.25 mg by mouth at bedtime.     No current facility-administered medications on file prior to visit.    Past Medical History:  Diagnosis Date   AK (actinic keratosis)    Allergy    Chicken pox    Compression fracture of L1 lumbar vertebra (HCC)    Compression fracture of thoracic vertebra (HCC)    Hypertension    Lentigines    Follows with DR Culton (derma)   Seizures ([place])     Past Surgical History:  Procedure Laterality Date   CARDIOVERSION N/A 05/14/2021   Procedure: CARDIOVERSION;  Surgeon: Donato Heinz, MD;  Location: [facility] ENDOSCOPY;  Service: Cardiovascular;  Laterality: N/A;   CARDIOVERSION N/A 10/08/2021   Procedure: CARDIOVERSION;  Surgeon: Werner Lean, MD;  Location: [facility] ENDOSCOPY;  Service: Cardiovascular;  Laterality: N/A;   COLONOSCOPY     mitral vavle     Congetinal mitral valve disease.   Allergies  Allergen Reactions   Other Other (See Comments)    Neosporin Ophthalmic causes eyes to turn red    Family History  Problem  Relation Age of Onset   Heart disease Mother    Cancer Father    Heart disease Father    Prostate cancer Father    Aneurysm Paternal Grandfather    Heart disease Son        congenital mitral valve dz   Colon cancer Neg Hx    Esophageal cancer Neg Hx    Stomach cancer Neg Hx    Rectal cancer Neg Hx    Social History   Socioeconomic History   Marital status: Domestic Partner    Spouse name: [name]   Number of children: Not on file   Years of education: Not on file   Highest education level: Bachelor's degree (e.g., BA, AB, BS)  Occupational History   Not on file  Tobacco Use   Smoking status: Never   Smokeless tobacco: Never   Tobacco comments:    Never  smoke 10/29/21   Substance and Sexual Activity   Alcohol use: Yes    Alcohol/week: 14.0 standard drinks of alcohol    Types: 14 Standard drinks or equivalent per week    Comment: 2 glasses of wine nightly 10/29/21   Drug use: No   Sexual activity: Not on file  Other Topics Concern   Not on file  Social History Narrative   Lives with wife [name]   R handed   Caffeine: 3 C of coffee AM   Social Determinants of Health   Financial Resource Strain: Low Risk  (06/21/2021)   Overall Financial Resource Strain (CARDIA)    Difficulty of Paying Living Expenses: Not hard at all  Food Insecurity: No Food Insecurity (06/21/2021)   Hunger Vital Sign    Worried About Running Out of Food in the Last Year: Never true    Ran Out of Food in the Last Year: Never true  Transportation Needs: No Transportation Needs (06/21/2021)   PRAPARE - Hydrologist (Medical): No    Lack of Transportation (Non-Medical): No  Physical Activity: Sufficiently Active (06/21/2021)   Exercise Vital Sign    Days of Exercise per Week: 4 days    Minutes of Exercise per Session: 60 min  Stress: No Stress Concern Present (06/21/2021)   [place]    Feeling of Stress :  Only a little  Social Connections: Moderately Isolated (06/21/2021)   Social Connection and Isolation Panel [NHANES]    Frequency of Communication with Friends and Family: Once a week    Frequency of Social Gatherings with Friends and Family: Once a week    Attends Religious Services: Never    Marine scientist or Organizations: Yes    Attends Music therapist: More than 4 times per year    Marital Status: Living with partner   Today's Vitals   05/07/22 1027  BP: 128/70  Pulse: 80  Resp: 16  SpO2: 98%  Weight: 202 lb (91.6 kg)  Height: '5\' 11"'$  (1.803 m)   Body mass index is 28.17 kg/m. Wt Readings from Last 3 Encounters:  05/07/22 202 lb (91.6 kg)  04/26/22 206 lb 2 oz (93.5 kg)  01/03/22 198 lb (89.8 kg)   Physical Exam Vitals and nursing note reviewed.  Constitutional:      General: He is not in acute distress.    Appearance: He is well-developed.  HENT:     Head: Normocephalic and atraumatic.     Right Ear: Tympanic membrane, ear canal and external ear normal.     Left Ear: Tympanic membrane, ear canal and external ear normal.     Mouth/Throat:     Mouth: Mucous membranes are moist.     Pharynx: Oropharynx is clear.  Eyes:     Conjunctiva/sclera: Conjunctivae normal.     Pupils: Pupils are equal, round, and reactive to light.  Neck:     Thyroid: No thyromegaly.     Trachea: No tracheal deviation.  Cardiovascular:     Rate and Rhythm: Normal rate and regular rhythm.     Pulses:          Dorsalis pedis pulses are 2+ on the right side and 2+ on the left side.     Heart sounds: No murmur heard. Pulmonary:     Effort: Pulmonary effort is normal. No respiratory distress.     Breath sounds: Normal breath sounds.  Abdominal:  Palpations: Abdomen is soft. There is no hepatomegaly or mass.     Tenderness: There is no abdominal tenderness.  Genitourinary:    Comments: No concerns. Musculoskeletal:        General: No tenderness.     Cervical back:  Normal range of motion.     Comments: No major signs of synovitis.  Lymphadenopathy:     Cervical: No cervical adenopathy.  Skin:    General: Skin is warm.     Findings: No erythema.  Neurological:     General: No focal deficit present.     Mental Status: He is alert and oriented to person, place, and time.     Cranial Nerves: No cranial nerve deficit.     Sensory: No sensory deficit.     Gait: Gait normal.     Deep Tendon Reflexes:     Reflex Scores:      Bicep reflexes are 2+ on the right side and 2+ on the left side.      Patellar reflexes are 2+ on the right side and 2+ on the left side. Psychiatric:        Mood and Affect: Mood and affect normal.   ASSESSMENT AND PLAN:  Mr.Takota was seen today for annual exam.  Diagnoses and all orders for this visit:  Orders Placed This Encounter  Procedures   DG Bone Density   Comprehensive metabolic panel   VITAMIN D 25 Hydroxy (Vit-D Deficiency, Fractures)   Lab Results  Component Value Date   CREATININE 1.03 05/07/2022   BUN 25 (H) 05/07/2022   NA 138 05/07/2022   K 4.1 05/07/2022   CL 100 05/07/2022   CO2 29 05/07/2022   Lab Results  Component Value Date   ALT 19 05/07/2022   AST 23 05/07/2022   ALKPHOS 61 05/07/2022   BILITOT 0.6 05/07/2022   Routine general medical examination at a health care facility Assessment & Plan: We discussed the importance of regular physical activity and healthy diet for prevention of chronic illness and/or complications. Preventive guidelines reviewed. Vaccination up to date. Prostate cancer screening guidelines discussed , we decided not to order PSA. Next CPE in a year.   Benign essential hypertension Assessment & Plan: BP adequately controlled. Continue amlodipine 5 mg daily and metoprolol succinate 25 mg daily as well as low-salt diet.  Orders: -     Comprehensive metabolic panel; Future  Vitamin D deficiency, unspecified Assessment & Plan: Continue Vit D 2000 U  daily. Further recommendations according to 25 [place] vit D result.  Orders: -     VITAMIN D 25 Hydroxy (Vit-D Deficiency, Fractures); Future  Localized osteoporosis without current pathological fracture Assessment & Plan: Continue Boniva. DEXA ordered.  Orders: -     DG Bone Density; Future  Return in 1 year (on 05/07/2023) for CPE.  Saran Laviolette G. Martinique, MD  [facility]. [place] office.

## 2022-05-07 ENCOUNTER — Ambulatory Visit (INDEPENDENT_AMBULATORY_CARE_PROVIDER_SITE_OTHER): Payer: Medicare Other | Admitting: Family Medicine

## 2022-05-07 ENCOUNTER — Encounter: Payer: Self-pay | Admitting: Family Medicine

## 2022-05-07 VITALS — BP 128/70 | HR 80 | Resp 16 | Ht 71.0 in | Wt 202.0 lb

## 2022-05-07 DIAGNOSIS — M816 Localized osteoporosis [Lequesne]: Secondary | ICD-10-CM

## 2022-05-07 DIAGNOSIS — I10 Essential (primary) hypertension: Secondary | ICD-10-CM | POA: Diagnosis not present

## 2022-05-07 DIAGNOSIS — Z0000 Encounter for general adult medical examination without abnormal findings: Secondary | ICD-10-CM

## 2022-05-07 DIAGNOSIS — E559 Vitamin D deficiency, unspecified: Secondary | ICD-10-CM

## 2022-05-07 DIAGNOSIS — I4891 Unspecified atrial fibrillation: Secondary | ICD-10-CM

## 2022-05-07 LAB — COMPREHENSIVE METABOLIC PANEL
ALT: 19 U/L (ref 0–53)
AST: 23 U/L (ref 0–37)
Albumin: 4 g/dL (ref 3.5–5.2)
Alkaline Phosphatase: 61 U/L (ref 39–117)
BUN: 25 mg/dL — ABNORMAL HIGH (ref 6–23)
CO2: 29 mEq/L (ref 19–32)
Calcium: 9.1 mg/dL (ref 8.4–10.5)
Chloride: 100 mEq/L (ref 96–112)
Creatinine, Ser: 1.03 mg/dL (ref 0.40–1.50)
GFR: 69.72 mL/min (ref >60.00)
Glucose, Bld: 91 mg/dL (ref 70–99)
Potassium: 4.1 mEq/L (ref 3.5–5.1)
Sodium: 138 mEq/L (ref 135–145)
Total Bilirubin: 0.6 mg/dL (ref 0.2–1.2)
Total Protein: 6.7 g/dL (ref 6.0–8.3)

## 2022-05-07 LAB — VITAMIN D 25 HYDROXY (VIT D DEFICIENCY, FRACTURES): VITD: 79.56 ng/mL (ref 30.00–100.00)

## 2022-05-07 NOTE — Assessment & Plan Note (Signed)
BP adequately controlled. Continue amlodipine 5 mg daily and metoprolol succinate 25 mg daily as well as low-salt diet.

## 2022-05-07 NOTE — Patient Instructions (Addendum)
A few things to remember from today's visit:  Routine general medical examination at a health care facility  Benign essential hypertension - Plan: Comprehensive metabolic panel  Atrial fibrillation, unspecified type ([place])  Vitamin D deficiency, unspecified - Plan: VITAMIN D 25 Hydroxy (Vit-D Deficiency, Fractures)  Localized osteoporosis without current pathological fracture - Plan: DG Bone Density  If you need refills for medications you take chronically, please call your pharmacy. Do not use My Chart to request refills or for acute issues that need immediate attention. If you send a my chart message, it may take a few days to be addressed, specially if I am not in the office.  Please be sure medication list is accurate. If a new problem present, please set up appointment sooner than planned today.

## 2022-05-07 NOTE — Assessment & Plan Note (Signed)
Continue Boniva. DEXA ordered.

## 2022-05-07 NOTE — Assessment & Plan Note (Signed)
We discussed the importance of regular physical activity and healthy diet for prevention of chronic illness and/or complications. Preventive guidelines reviewed. Vaccination up to date. Prostate cancer screening guidelines discussed , we decided not to order PSA. Next CPE in a year.

## 2022-05-07 NOTE — Assessment & Plan Note (Signed)
Continue Vit D 2000 U daily. Further recommendations according to 25 [place] vit D result.

## 2022-05-10 ENCOUNTER — Ambulatory Visit (INDEPENDENT_AMBULATORY_CARE_PROVIDER_SITE_OTHER)
Admission: RE | Admit: 2022-05-10 | Discharge: 2022-05-10 | Disposition: A | Discharge status: Home / Self Care | Payer: Medicare Other | Source: Ambulatory Visit | Attending: Family Medicine | Admitting: Family Medicine

## 2022-05-10 DIAGNOSIS — M816 Localized osteoporosis [Lequesne]: Secondary | ICD-10-CM | POA: Diagnosis not present

## 2022-05-20 ENCOUNTER — Encounter: Payer: Self-pay | Admitting: Neurology

## 2022-05-20 ENCOUNTER — Ambulatory Visit: Payer: Medicare Other | Admitting: Neurology

## 2022-05-20 VITALS — BP 120/75 | HR 85 | Ht 71.0 in | Wt 204.0 lb

## 2022-05-20 DIAGNOSIS — Z8669 Personal history of other diseases of the nervous system and sense organs: Secondary | ICD-10-CM | POA: Diagnosis not present

## 2022-05-20 MED ORDER — DIVALPROEX SODIUM 500 MG PO DR TAB: 500.0000 mg | DELAYED_RELEASE_TABLET | Freq: Two times a day (BID) | ORAL | 2 refills | Status: DC

## 2022-05-20 MED ORDER — DIVALPROEX SODIUM 250 MG PO DR TAB: 500.0000 mg | DELAYED_RELEASE_TABLET | Freq: Two times a day (BID) | ORAL | 5 refills | Status: DC

## 2022-05-20 NOTE — Progress Notes (Signed)
[facility] [address] [place]. [place] [address] [phone]       OFFICE FOLLOW-UP VISIT NOTE  Mr. Clarice Bonaventure Date of Birth:  [birth year] Medical Record Number:  [id number]   Referring MD:   Betty Martinique  Reason for Referral: Seizure  HPI: Initial visit 07/19/2021 Mr. Paluch is a pleasant 77 year old male seen today for consultation for seizures.  He is accompanied by his wife [name].  History is obtained from them and review of electronic medical records and I personally reviewed pertinent available imaging films in PACS.  He has past medical history of hypertension seizures.  He had 2 episodes of witnessed generalized tonic-clonic seizures in July 2017 which were witnessed by his girlfriend at that time.  He was admitted and had EEG which was normal but patient reported that he had previous episodes of transient confusion and altered sensorium going back at least 4 years prior.  He was diagnosed with complex partial seizures with secondary generalization and started on Keppra 500 mg twice daily.  CT scan of the head and MRI scan were both done which were unremarkable.  EEG done twice was normal.  He was seen by me at that time and subsequently lost to follow-up.  Patient states that he moved to [facility] area and was seeing Dr. Melrose Nakayama neurologist there.  He started having some personality changes on Keppra and hence he was switched to Depakote about 2 years ago.  He is currently taking Depakote DR to 50 mg 3 times daily.  It worked quite well and for 6 months he had no episodes but is now having recurrent episodes of the frequency which is variable but about once every 2 to 3 months.  The episode consisted of a 6 stare and the patient also has involuntary chronic bending of his right elbow and arm.  This last 1 to 2 minutes and during this time he may be transiently unresponsive will not answer questions.  After this he quickly returns back to normal.  The last episode was on  06/27/2021 when he was driving he suddenly stopped responding when his wife was speaking to him but he was able to follow her instructions and pulled over and she took over driving.  Fortunately did not have an accident.  Patient denies any specific trigger for these episodes but they occur at available frequency.  He has recently moved back to [place] area and wants to transfer neurological care to me.  Review of care everywhere shows that his last office visit visit with Dr. Melrose Nakayama was on 05/16/2021 and he was doing fine and no medication changes were made.  Valproic acid level on 02/01/2020 was 41 mg percent and on 04/14/2020 was 32 mg percent.  He also had a 72-hour ambulatory EEG with video on 06/25/2019 which was normal. Update 10/11/2021: He returns for follow-up after last visit 2 and half months ago.  He is accompanied by his wife.  He states he is doing well and has no further episodes of seizures staring or altered sensorium.  He is tolerating Depakote ER 500 mg twice daily well without any side effects.  He had lab work done at last visit on 07/19/2021 and valproic acid level was low normal at 51 mg.  Liver function test normal.  He has today no neurological complaints.  He went into A-fib with rapid heart rate and needed cardiac ablation.  He remains on Eliquis which is tolerating well without bruising or bleeding.  He is  planning a trip to [place] soon with his wife. Update 05/20/2022 : He returns for follow-up after last visit 6 months ago.  He is accompanied by significant other.  Patient was doing well in December but since then has been having 1 episode per month for the last 4 months.  Episodes are still difficult with the patient described as being with above legs look on his face.  Unresponsive.  Unable to make appropriate responses.  Mumbling.  His last only few minutes.  He was found to be staring during this.  He is able to follow some instructions at that time.  Patient is currently on Depakote  DR 500 mg twice daily which she seems to be tolerating well without side effects.  He has not had any recent lab work or valproic acid levels checked.  He states he is quite compliant with medications and has not missed any doses.  He is sleeping adequately and denies any other obvious seizure provoking stimuli.  He remains on Eliquis for his A-fib and is tolerating it well without bruising or bleeding and has no problems affording the co-pay. ROS:   14 system review of systems is positive for seizures, altered awareness, staring, and involuntary hand movement and all other systems negative  PMH:  Past Medical History:  Diagnosis Date   AK (actinic keratosis)    Allergy    Chicken pox    Compression fracture of L1 lumbar vertebra (HCC)    Compression fracture of thoracic vertebra (HCC)    Hypertension    Lentigines    Follows with DR Culton (derma)   Seizures (HCC)     Social History:  Social History   Socioeconomic History   Marital status: Domestic Partner    Spouse name: [name]   Number of children: Not on file   Years of education: Not on file   Highest education level: Bachelor's degree (e.g., BA, AB, BS)  Occupational History   Not on file  Tobacco Use   Smoking status: Never   Smokeless tobacco: Never   Tobacco comments:    Never smoke 10/29/21   Substance and Sexual Activity   Alcohol use: Yes    Alcohol/week: 14.0 standard drinks of alcohol    Types: 14 Standard drinks or equivalent per week    Comment: 2 glasses of wine nightly 10/29/21   Drug use: No   Sexual activity: Not on file  Other Topics Concern   Not on file  Social History Narrative   Lives with wife [name]   R handed   Caffeine: 3 C of coffee AM   Social Determinants of Health   Financial Resource Strain: Low Risk  (06/21/2021)   Overall Financial Resource Strain (CARDIA)    Difficulty of Paying Living Expenses: Not hard at all  Food Insecurity: No Food Insecurity (06/21/2021)   Hunger Vital Sign     Worried About Running Out of Food in the Last Year: Never true    Ran Out of Food in the Last Year: Never true  Transportation Needs: No Transportation Needs (06/21/2021)   PRAPARE - Hydrologist (Medical): No    Lack of Transportation (Non-Medical): No  Physical Activity: Sufficiently Active (06/21/2021)   Exercise Vital Sign    Days of Exercise per Week: 4 days    Minutes of Exercise per Session: 60 min  Stress: No Stress Concern Present (06/21/2021)   Spicer  Feeling of Stress : Only a little  Social Connections: Moderately Isolated (06/21/2021)   Social Connection and Isolation Panel [NHANES]    Frequency of Communication with Friends and Family: Once a week    Frequency of Social Gatherings with Friends and Family: Once a week    Attends Religious Services: Never    Marine scientist or Organizations: Yes    Attends Music therapist: More than 4 times per year    Marital Status: Living with partner  Intimate Partner Violence: Not At Risk (06/21/2021)   Humiliation, Afraid, Rape, and Kick questionnaire    Fear of Current or Ex-Partner: No    Emotionally Abused: No    Physically Abused: No    Sexually Abused: No    Medications:   Current Outpatient Medications on File Prior to Visit  Medication Sig Dispense Refill   amLODipine (NORVASC) 5 MG tablet TAKE 1 TABLET (5 MG TOTAL) BY MOUTH DAILY. 90 tablet 3   apixaban (ELIQUIS) 5 MG TABS tablet Take 1 tablet (5 mg total) by mouth 2 (two) times daily. 180 tablet 1   beta carotene w/minerals (OCUVITE) tablet Take 1 tablet by mouth daily.     Cholecalciferol (VITAMIN D3) 50 MCG (2000 UT) TABS Take 2,000 tablets by mouth every evening.     divalproex (DEPAKOTE) 500 MG DR tablet Take 1 tablet (500 mg total) by mouth 2 (two) times daily. 180 tablet 2   gabapentin (NEURONTIN) 300 MG capsule Take 1 capsule (300 mg total) by  mouth at bedtime. 30 capsule 5   Homeopathic Products (ARNICARE) GEL Apply 1 Application topically 3 (three) times a week. On Ankle     ibandronate (BONIVA) 150 MG tablet TAKE 1 TABLET BY MOUTH EVERY 30 DAYS. TAKE IN AM WITH FULL GLASS OF WATER ON EMPTY STOMACH AND DON'T TAKE ANYTHING ELSE BY MOUTH OR LIE DOWN FOR THE NEXT 30 MINUTES 3 tablet 5   metoprolol succinate (TOPROL-XL) 25 MG 24 hr tablet TAKE 1 TABLET (25 MG TOTAL) BY MOUTH DAILY. 90 tablet 3   Multiple Vitamin (CALCIUM COMPLEX PO) Take 1 tablet by mouth 2 (two) times daily. Bone Up [place] Formulas     Multiple Vitamin (MULTIVITAMIN WITH MINERALS) TABS tablet Take 1 tablet by mouth 2 (two) times daily. Vitamin Code Whole Food Multivitamin     risperiDONE (RISPERDAL) 0.25 MG tablet Take 0.25 mg by mouth at bedtime.     No current facility-administered medications on file prior to visit.    Allergies:   Allergies  Allergen Reactions   Other Other (See Comments)    Neosporin Ophthalmic causes eyes to turn red    Physical Exam General: well developed, well nourished, seated, in no evident distress Head: head normocephalic and atraumatic.   Neck: supple with no carotid or supraclavicular bruits Cardiovascular: regular rate and rhythm, no murmurs Musculoskeletal: no deformity Skin:  no rash/petichiae Vascular:  Normal pulses all extremities  Neurologic Exam Mental Status: Awake and fully alert. Oriented to place and time. Recent and remote memory intact. Attention span, concentration and fund of knowledge appropriate. Mood and affect appropriate.  Cranial Nerves: Fundoscopic exam reveals sharp disc margins. Pupils equal, briskly reactive to light. Extraocular movements full without nystagmus. Visual fields full to confrontation. Hearing intact. Facial sensation intact. Face, tongue, palate moves normally and symmetrically.  Motor: Normal bulk and tone. Normal strength in all tested extremity muscles. Sensory.: intact to touch ,  pinprick , position and vibratory sensation.  Coordination: Rapid  alternating movements normal in all extremities. Finger-to-nose and heel-to-shin performed accurately bilaterally. Gait and Station: Arises from chair without difficulty. Stance is normal. Gait demonstrates normal stride length and balance . Able to heel, toe and tandem walk without difficulty.  Reflexes: 1+ and symmetric. Toes downgoing.         ASSESSMENT: 77 year old Caucasian male with recurrent stereotypical transient episodes of staring with altered awareness and tonic posturing of the right upper extremity likely partial complex seizures with some secondary generalization.  These episodes have been occurring since at least 2017 and possibly earlier.  He was doing well on Keppra but was switched to Depakote due to perceived side effects but seems to be having breakthrough episodes on the current dose     PLAN:I had a long discussion with the patient and his significant other regarding his complex partial seizures he seems not  well-controlled on the current medication regimen of Depakote DR 500 twice daily he feels tolerating well without side effects.  Recommending the dose to Depakote ER 750 mg twice daily I gave him a refill for 5 months per his request and advised him to avoid seizure provoking stimuli like sleep deprivation, medication noncompliance ,irregularity eating and sleeping habits..  Check valproic acid level BMP and CBC today.  He will  follow-up in 6 months or call earlier if necessary.  Greater than 50% time during this 35-minute  visit for spent in counseling and coordination of care about his episodes of partial complex seizures and discussion about seizure prevention and treatment and answering questions. Antony Contras, MD  Note: This document was prepared with digital dictation and possible smart phrase technology. Any transcriptional errors that result from this process are unintentional.

## 2022-05-20 NOTE — Patient Instructions (Addendum)
I had a long discussion with the patient and his significant other regarding his complex partial seizures he seems not  well-controlled on the current medication regimen of Depakote DR 500 twice daily he feels tolerating well without side effects.  Recommending the dose to Depakote ER 750 mg twice daily I gave him a refill for 5 months per his request and advised him to avoid seizure provoking stimuli like sleep deprivation, medication noncompliance ,irregularity eating and sleeping habits.Samuel Mayo  He will  follow-up in 6 months or call earlier if necessary.

## 2022-05-22 LAB — COMPREHENSIVE METABOLIC PANEL
ALT: 20 IU/L (ref 0–44)
AST: 26 IU/L (ref 0–40)
Albumin/Globulin Ratio: 2 (ref 1.2–2.2)
Albumin: 3.9 g/dL (ref 3.8–4.8)
Alkaline Phosphatase: 62 IU/L (ref 44–121)
BUN/Creatinine Ratio: 20 (ref 10–24)
BUN: 24 mg/dL (ref 8–27)
Bilirubin Total: 0.4 mg/dL (ref 0.0–1.2)
CO2: 25 mmol/L (ref 20–29)
Calcium: 8.9 mg/dL (ref 8.6–10.2)
Chloride: 101 mmol/L (ref 96–106)
Creatinine, Ser: 1.22 mg/dL (ref 0.76–1.27)
Globulin, Total: 2 g/dL (ref 1.5–4.5)
Glucose: 107 mg/dL — ABNORMAL HIGH (ref 70–99)
Potassium: 4.5 mmol/L (ref 3.5–5.2)
Sodium: 141 mmol/L (ref 134–144)
Total Protein: 5.9 g/dL — ABNORMAL LOW (ref 6.0–8.5)
eGFR: 61 mL/min/{1.73_m2} (ref >59)

## 2022-05-22 LAB — CBC
Hematocrit: 39.3 % (ref 37.5–51.0)
Hemoglobin: 13.6 g/dL (ref 13.0–17.7)
MCH: 33.9 pg — ABNORMAL HIGH (ref 26.6–33.0)
MCHC: 34.6 g/dL (ref 31.5–35.7)
MCV: 98 fL — ABNORMAL HIGH (ref 79–97)
Platelets: 201 10*3/uL (ref 150–450)
RBC: 4.01 x10E6/uL — ABNORMAL LOW (ref 4.14–5.80)
RDW: 11.9 % (ref 11.6–15.4)
WBC: 3.9 10*3/uL (ref 3.4–10.8)

## 2022-05-22 LAB — VALPROIC ACID LEVEL: Valproic Acid Lvl: 46 ug/mL — ABNORMAL LOW (ref 50–100)

## 2022-05-24 NOTE — Progress Notes (Signed)
Kindly inform the patient that valproic acid level was slightly on the lower side.  CBC and competence of metabolic panel labs were satisfactory

## 2022-05-27 ENCOUNTER — Telehealth: Payer: Self-pay

## 2022-05-27 NOTE — Telephone Encounter (Signed)
Left msg for pt to call to obtain results

## 2022-05-27 NOTE — Telephone Encounter (Signed)
-----   Message from Garvin Fila, MD sent at 05/24/2022  8:27 AM EDT ----- Mitchell Heir inform the patient that valproic acid level was slightly on the lower side.  CBC and competence of metabolic panel labs were satisfactory

## 2022-05-29 NOTE — Telephone Encounter (Signed)
Pt called in so I kindly informed him of his result and voiced understanding

## 2022-06-11 DIAGNOSIS — L820 Inflamed seborrheic keratosis: Secondary | ICD-10-CM | POA: Diagnosis not present

## 2022-07-10 ENCOUNTER — Telehealth: Payer: Self-pay | Admitting: Family Medicine

## 2022-07-10 NOTE — Telephone Encounter (Signed)
Contacted Samuel Mayo to schedule their annual wellness visit. Appointment made for 07/17/22. Samuel Mayo AWV direct phone # [phone]

## 2022-07-17 ENCOUNTER — Ambulatory Visit (INDEPENDENT_AMBULATORY_CARE_PROVIDER_SITE_OTHER): Payer: Medicare Other

## 2022-07-17 VITALS — BP 120/60 | HR 69 | Temp 97.8°F | Ht 71.0 in | Wt 205.5 lb

## 2022-07-17 DIAGNOSIS — Z0000 Encounter for general adult medical examination without abnormal findings: Secondary | ICD-10-CM

## 2022-07-17 NOTE — Patient Instructions (Addendum)
Samuel Mayo , Thank you for taking time to come for your Medicare Wellness Visit. I appreciate your ongoing commitment to your health goals. Please review the following plan we discussed and let me know if I can assist you in the future.   These are the goals we discussed:  Goals       Maintain current lifestyle (pt-stated)      Travel More.      Travel More (pt-stated)        This is a list of the screening recommended for you and due dates:  Health Maintenance  Topic Date Due   COVID-19 Vaccine (4 - 2023-24 season) 08/02/2022*   Zoster (Shingles) Vaccine (1 of 2) 03/09/2026*   Flu Shot  10/03/2022   Medicare Annual Wellness Visit  07/17/2023   DTaP/Tdap/Td vaccine (2 - Td or Tdap) 10/06/2023   Pneumonia Vaccine  Completed   Hepatitis C Screening: USPSTF Recommendation to screen - Ages 64-79 yo.  Completed   HPV Vaccine  Aged Out   Colon Cancer Screening  Discontinued  *Topic was postponed. The date shown is not the original due date.    Advanced directives: Please bring a copy of your health care power of attorney and living will to the office to be added to your chart at your convenience.   Conditions/risks identified: None  Next appointment: Follow up in one year for your annual wellness visit.    Preventive Care 65 Years and Older, Male  Preventive care refers to lifestyle choices and visits with your health care provider that can promote health and wellness. What does preventive care include? A yearly physical exam. This is also called an annual well check. Dental exams once or twice a year. Routine eye exams. Ask your health care provider how often you should have your eyes checked. Personal lifestyle choices, including: Daily care of your teeth and gums. Regular physical activity. Eating a healthy diet. Avoiding tobacco and drug use. Limiting alcohol use. Practicing safe sex. Taking low doses of aspirin every day. Taking vitamin and mineral supplements as  recommended by your health care provider. What happens during an annual well check? The services and screenings done by your health care provider during your annual well check will depend on your age, overall health, lifestyle risk factors, and family history of disease. Counseling  Your health care provider may ask you questions about your: Alcohol use. Tobacco use. Drug use. Emotional well-being. Home and relationship well-being. Sexual activity. Eating habits. History of falls. Memory and ability to understand (cognition). Work and work Astronomer. Screening  You may have the following tests or measurements: Height, weight, and BMI. Blood pressure. Lipid and cholesterol levels. These may be checked every 5 years, or more frequently if you are over 18 years old. Skin check. Lung cancer screening. You may have this screening every year starting at age 9 if you have a 30-pack-year history of smoking and currently smoke or have quit within the past 15 years. Fecal occult blood test (FOBT) of the stool. You may have this test every year starting at age 70. Flexible sigmoidoscopy or colonoscopy. You may have a sigmoidoscopy every 5 years or a colonoscopy every 10 years starting at age 84. Prostate cancer screening. Recommendations will vary depending on your family history and other risks. Hepatitis C blood test. Hepatitis B blood test. Sexually transmitted disease (STD) testing. Diabetes screening. This is done by checking your blood sugar (glucose) after you have not eaten for a while (  fasting). You may have this done every 1-3 years. Abdominal aortic aneurysm (AAA) screening. You may need this if you are a current or former smoker. Osteoporosis. You may be screened starting at age 20 if you are at high risk. Talk with your health care provider about your test results, treatment options, and if necessary, the need for more tests. Vaccines  Your health care provider may recommend  certain vaccines, such as: Influenza vaccine. This is recommended every year. Tetanus, diphtheria, and acellular pertussis (Tdap, Td) vaccine. You may need a Td booster every 10 years. Zoster vaccine. You may need this after age 28. Pneumococcal 13-valent conjugate (PCV13) vaccine. One dose is recommended after age 68. Pneumococcal polysaccharide (PPSV23) vaccine. One dose is recommended after age 54. Talk to your health care provider about which screenings and vaccines you need and how often you need them. This information is not intended to replace advice given to you by your health care provider. Make sure you discuss any questions you have with your health care provider. Document Released: 03/17/2015 Document Revised: 11/08/2015 Document Reviewed: 12/20/2014 Elsevier Interactive Patient Education  2017 [place] Prevention in the Home Falls can cause injuries. They can happen to people of all ages. There are many things you can do to make your home safe and to help prevent falls. What can I do on the outside of my home? Regularly fix the edges of walkways and driveways and fix any cracks. Remove anything that might make you trip as you walk through a door, such as a raised step or threshold. Trim any bushes or trees on the path to your home. Use bright outdoor lighting. Clear any walking paths of anything that might make someone trip, such as rocks or tools. Regularly check to see if handrails are loose or broken. Make sure that both sides of any steps have handrails. Any raised decks and porches should have guardrails on the edges. Have any leaves, snow, or ice cleared regularly. Use sand or salt on walking paths during winter. Clean up any spills in your garage right away. This includes oil or grease spills. What can I do in the bathroom? Use night lights. Install grab bars by the toilet and in the tub and shower. Do not use towel bars as grab bars. Use non-skid mats or  decals in the tub or shower. If you need to sit down in the shower, use a plastic, non-slip stool. Keep the floor dry. Clean up any water that spills on the floor as soon as it happens. Remove soap buildup in the tub or shower regularly. Attach bath mats securely with double-sided non-slip rug tape. Do not have throw rugs and other things on the floor that can make you trip. What can I do in the bedroom? Use night lights. Make sure that you have a light by your bed that is easy to reach. Do not use any sheets or blankets that are too big for your bed. They should not hang down onto the floor. Have a firm chair that has side arms. You can use this for support while you get dressed. Do not have throw rugs and other things on the floor that can make you trip. What can I do in the kitchen? Clean up any spills right away. Avoid walking on wet floors. Keep items that you use a lot in easy-to-reach places. If you need to reach something above you, use a strong step stool that has a grab bar.  Keep electrical cords out of the way. Do not use floor polish or wax that makes floors slippery. If you must use wax, use non-skid floor wax. Do not have throw rugs and other things on the floor that can make you trip. What can I do with my stairs? Do not leave any items on the stairs. Make sure that there are handrails on both sides of the stairs and use them. Fix handrails that are broken or loose. Make sure that handrails are as long as the stairways. Check any carpeting to make sure that it is firmly attached to the stairs. Fix any carpet that is loose or worn. Avoid having throw rugs at the top or bottom of the stairs. If you do have throw rugs, attach them to the floor with carpet tape. Make sure that you have a light switch at the top of the stairs and the bottom of the stairs. If you do not have them, ask someone to add them for you. What else can I do to help prevent falls? Wear shoes that: Do not  have high heels. Have rubber bottoms. Are comfortable and fit you well. Are closed at the toe. Do not wear sandals. If you use a stepladder: Make sure that it is fully opened. Do not climb a closed stepladder. Make sure that both sides of the stepladder are locked into place. Ask someone to hold it for you, if possible. Clearly mark and make sure that you can see: Any grab bars or handrails. First and last steps. Where the edge of each step is. Use tools that help you move around (mobility aids) if they are needed. These include: Canes. Walkers. Scooters. Crutches. Turn on the lights when you go into a dark area. Replace any light bulbs as soon as they burn out. Set up your furniture so you have a clear path. Avoid moving your furniture around. If any of your floors are uneven, fix them. If there are any pets around you, be aware of where they are. Review your medicines with your doctor. Some medicines can make you feel dizzy. This can increase your chance of falling. Ask your doctor what other things that you can do to help prevent falls. This information is not intended to replace advice given to you by your health care provider. Make sure you discuss any questions you have with your health care provider. Document Released: 12/15/2008 Document Revised: 07/27/2015 Document Reviewed: 03/25/2014 Elsevier Interactive Patient Education  2017 [employer].

## 2022-07-17 NOTE — Progress Notes (Signed)
Subjective:   Samuel Mayo is a 77 y.o. male who presents for Medicare Annual/Subsequent preventive examination.  Review of Systems     Cardiac Risk Factors include: advanced age (>95men, >38 women);male gender;hypertension     Objective:    Today's Vitals   07/17/22 1553  BP: 120/60  Pulse: 69  Temp: 97.8 F (36.6 C)  TempSrc: Oral  SpO2: 98%  Weight: 205 lb 8 oz (93.2 kg)  Height: 5\' 11"  (1.803 m)   Body mass index is 28.66 kg/m.     07/17/2022    4:02 PM 10/08/2021    9:06 AM 06/21/2021   10:11 AM 05/14/2021    7:41 AM 10/07/2019    1:03 PM 06/12/2017   12:33 PM  Advanced Directives  Does Patient Have a Medical Advance Directive? Yes Yes Yes Yes No Yes  Type of Estate agent of [place];Living will Healthcare Power of [place];Living will Healthcare Power of [place];Living will Healthcare Power of [employer] Power of Attorney  Does patient want to make changes to medical advance directive?   No - Patient declined     Copy of Healthcare Power of Attorney in Chart? No - copy requested No - copy requested No - copy requested       Current Medications (verified) Outpatient Encounter Medications as of 07/17/2022  Medication Sig   amLODipine (NORVASC) 5 MG tablet TAKE 1 TABLET (5 MG TOTAL) BY MOUTH DAILY.   apixaban (ELIQUIS) 5 MG TABS tablet Take 1 tablet (5 mg total) by mouth 2 (two) times daily.   beta carotene w/minerals (OCUVITE) tablet Take 1 tablet by mouth daily.   Cholecalciferol (VITAMIN D3) 50 MCG (2000 UT) TABS Take 2,000 tablets by mouth every evening.   divalproex (DEPAKOTE) 250 MG DR tablet Take 2 tablets (500 mg total) by mouth 2 (two) times daily.   divalproex (DEPAKOTE) 500 MG DR tablet Take 1 tablet (500 mg total) by mouth 2 (two) times daily.   gabapentin (NEURONTIN) 300 MG capsule Take 1 capsule (300 mg total) by mouth at bedtime.   Homeopathic Products (ARNICARE) GEL Apply 1 Application topically 3 (three) times a week.  On Ankle   ibandronate (BONIVA) 150 MG tablet TAKE 1 TABLET BY MOUTH EVERY 30 DAYS. TAKE IN AM WITH FULL GLASS OF WATER ON EMPTY STOMACH AND DON'T TAKE ANYTHING ELSE BY MOUTH OR LIE DOWN FOR THE NEXT 30 MINUTES   metoprolol succinate (TOPROL-XL) 25 MG 24 hr tablet TAKE 1 TABLET (25 MG TOTAL) BY MOUTH DAILY.   Multiple Vitamin (CALCIUM COMPLEX PO) Take 1 tablet by mouth 2 (two) times daily. Bone Up Jarrow Formulas   Multiple Vitamin (MULTIVITAMIN WITH MINERALS) TABS tablet Take 1 tablet by mouth 2 (two) times daily. Vitamin Code Whole Food Multivitamin   risperiDONE (RISPERDAL) 0.25 MG tablet Take 0.25 mg by mouth at bedtime.   No facility-administered encounter medications on file as of 07/17/2022.    Allergies (verified) Other   History: Past Medical History:  Diagnosis Date   AK (actinic keratosis)    Allergy    Chicken pox    Compression fracture of L1 lumbar vertebra (HCC)    Compression fracture of thoracic vertebra (HCC)    Hypertension    Lentigines    Follows with DR Culton (derma)   Seizures (HCC)    Past Surgical History:  Procedure Laterality Date   CARDIOVERSION N/A 05/14/2021   Procedure: CARDIOVERSION;  Surgeon: Little Ishikawa, MD;  Location: [facility] ENDOSCOPY;  Service: Cardiovascular;  Laterality: N/A;   CARDIOVERSION N/A 10/08/2021   Procedure: CARDIOVERSION;  Surgeon: Christell Constant, MD;  Location: [facility] ENDOSCOPY;  Service: Cardiovascular;  Laterality: N/A;   COLONOSCOPY     mitral vavle     Congetinal mitral valve disease.   Family History  Problem Relation Age of Onset   Heart disease Mother    Cancer Father    Heart disease Father    Prostate cancer Father    Aneurysm Paternal Grandfather    Heart disease Son        congenital mitral valve dz   Colon cancer Neg Hx    Esophageal cancer Neg Hx    Stomach cancer Neg Hx    Rectal cancer Neg Hx    Social History   Socioeconomic History   Marital status: Domestic Partner    Spouse name:  [name]   Number of children: Not on file   Years of education: Not on file   Highest education level: Bachelor's degree (e.g., BA, AB, BS)  Occupational History   Not on file  Tobacco Use   Smoking status: Never   Smokeless tobacco: Never   Tobacco comments:    Never smoke 10/29/21   Substance and Sexual Activity   Alcohol use: Yes    Alcohol/week: 14.0 standard drinks of alcohol    Types: 14 Standard drinks or equivalent per week    Comment: 2 glasses of wine nightly 10/29/21   Drug use: No   Sexual activity: Not on file  Other Topics Concern   Not on file  Social History Narrative   Lives with wife [name]   R handed   Caffeine: 3 C of coffee AM   Social Determinants of Health   Financial Resource Strain: Low Risk  (07/17/2022)   Overall Financial Resource Strain (CARDIA)    Difficulty of Paying Living Expenses: Not hard at all  Food Insecurity: No Food Insecurity (07/17/2022)   Hunger Vital Sign    Worried About Running Out of Food in the Last Year: Never true    Ran Out of Food in the Last Year: Never true  Transportation Needs: No Transportation Needs (07/17/2022)   PRAPARE - Administrator, Civil Service (Medical): No    Lack of Transportation (Non-Medical): No  Physical Activity: Sufficiently Active (07/17/2022)   Exercise Vital Sign    Days of Exercise per Week: 7 days    Minutes of Exercise per Session: 30 min  Stress: No Stress Concern Present (07/17/2022)   [employer] of Occupational Health - Occupational Stress Questionnaire    Feeling of Stress : Not at all  Social Connections: Socially Integrated (07/17/2022)   Social Connection and Isolation Panel [NHANES]    Frequency of Communication with Friends and Family: More than three times a week    Frequency of Social Gatherings with Friends and Family: More than three times a week    Attends Religious Services: More than 4 times per year    Active Member of [employer] or Organizations: Yes    Attends  Engineer, structural: More than 4 times per year    Marital Status: Married    Tobacco Counseling Counseling given: Not Answered Tobacco comments: Never smoke 10/29/21    Clinical Intake:  Pre-visit preparation completed: Yes  Pain : No/denies pain     BMI - recorded: 28.66 Nutritional Status: BMI 25 -29 Overweight Nutritional Risks: None Diabetes: No  How often do you need to have  someone help you when you read instructions, pamphlets, or other written materials from your doctor or pharmacy?: 1 - Never  Diabetic? No  Interpreter Needed?: No  Information entered by :: Theresa Mulligan LPN   Activities of Daily Living    07/17/2022    4:01 PM 07/17/2022   10:11 AM  In your present state of health, do you have any difficulty performing the following activities:  Hearing? 0 0  Vision? 0 0  Difficulty concentrating or making decisions? 0 0  Walking or climbing stairs? 0 0  Dressing or bathing? 0 0  Doing errands, shopping? 0 0  Preparing Food and eating ? N N  Using the Toilet? N N  In the past six months, have you accidently leaked urine? N N  Do you have problems with loss of bowel control? N N  Managing your Medications? N N  Managing your Finances? N N  Housekeeping or managing your Housekeeping? N N    Patient Care Team: [name], [name], MD as PCP - General (Family Medicine) Wyline Mood Alben Spittle, MD as PCP - Cardiology (Cardiology) Morene Crocker, MD as Referring Physician (Neurology)  Indicate any recent Medical Services you may have received from other than [facility] providers in the past year (date may be approximate).     Assessment:   This is a routine wellness examination for Al[place].  Hearing/Vision screen Hearing Screening - Comments:: Denies hearing difficulties   Vision Screening - Comments:: Wears rx glasses - up to date with routine eye exams with  Deferred  Dietary issues and exercise activities discussed: Current Exercise Habits: Home  exercise routine, Type of exercise: walking, Time (Minutes): 30, Frequency (Times/Week): 7, Weekly Exercise (Minutes/Week): 210, Exercise limited by: None identified   Goals Addressed               This Visit's Progress     Travel More (pt-stated)         Depression Screen    07/17/2022    3:58 PM 05/07/2022   10:37 AM 04/26/2022   11:06 AM 06/21/2021   10:06 AM 05/21/2021   11:32 AM 02/21/2021    9:32 AM 02/18/2020    2:11 PM  PHQ 2/9 Scores  PHQ - 2 Score 0 0 0 0 0 0 0  PHQ- 9 Score    0 0      Fall Risk    07/17/2022    4:02 PM 07/17/2022   10:11 AM 05/07/2022   10:37 AM 04/26/2022   11:06 AM 06/21/2021   10:09 AM  Fall Risk   Falls in the past year? 0 0 0 0 0  Number falls in past yr: 0 0 0 0 0  Injury with Fall? 0 0 0 0 0  Risk for fall due to : No Fall Risks  Other (Comment) Other (Comment) No Fall Risks  Follow up Falls prevention discussed  Falls evaluation completed Falls evaluation completed     FALL RISK PREVENTION PERTAINING TO THE HOME:  Any stairs in or around the home? Yes If so, are there any without handrails? No  Home free of loose throw rugs in walkways, pet beds, electrical cords, etc? Yes  Adequate lighting in your home to reduce risk of falls? No   ASSISTIVE DEVICES UTILIZED TO PREVENT FALLS:  Life alert? No  Use of a cane, walker or w/c? No  Grab bars in the bathroom? Yes Shower chair or bench in shower? Yes Elevated toilet seat or a  handicapped toilet? No   TIMED UP AND GO:  Was the test performed? Yes .  Length of time to ambulate 10 feet: 10 sec.   Gait steady and fast without use of assistive device  Cognitive Function:        07/17/2022    4:03 PM 06/21/2021   10:11 AM  6CIT Screen  What Year? 0 points 0 points  What month? 0 points 0 points  What time? 0 points 0 points  Count back from 20 0 points 0 points  Months in reverse 0 points 0 points  Repeat phrase 0 points 0 points  Total Score 0 points 0 points     Immunizations Immunization History  Administered Date(s) Administered   Influenza, High Dose Seasonal PF 12/18/2016, 11/10/2017, 10/30/2018   Influenza-Unspecified 12/18/2016   PFIZER(Purple Top)SARS-COV-2 Vaccination 04/11/2019, 05/06/2019, 12/18/2019   Pneumococcal Conjugate-13 01/31/2017   Pneumococcal Polysaccharide-23 02/02/2018   Tdap 10/05/2013    TDAP status: Up to date  Flu Vaccine status: Up to date  Pneumococcal vaccine status: Up to date  Covid-19 vaccine status: Completed vaccines  Qualifies for Shingles Vaccine? Yes   Zostavax completed No   Shingrix Completed?: No.    Education has been provided regarding the importance of this vaccine. Patient has been advised to call insurance company to determine out of pocket expense if they have not yet received this vaccine. Advised may also receive vaccine at local pharmacy or Health Dept. Verbalized acceptance and understanding.  Screening Tests Health Maintenance  Topic Date Due   COVID-19 Vaccine (4 - 2023-24 season) 08/02/2022 (Originally 11/02/2021)   Zoster Vaccines- Shingrix (1 of 2) 03/09/2026 (Originally 02/21/1965)   INFLUENZA VACCINE  10/03/2022   Medicare Annual Wellness (AWV)  07/17/2023   DTaP/Tdap/Td (2 - Td or Tdap) 10/06/2023   Pneumonia Vaccine 38+ Years old  Completed   Hepatitis C Screening  Completed   HPV VACCINES  Aged Out   COLONOSCOPY (Pts 45-46yrs Insurance coverage will need to be confirmed)  Discontinued    Health Maintenance  There are no preventive care reminders to display for this patient.   Colorectal cancer screening: No longer required.   Lung Cancer Screening: (Low Dose CT Chest recommended if Age 17-80 years, 30 pack-year currently smoking OR have quit w/in 15years.) does not qualify.     Additional Screening:  Hepatitis C Screening: does qualify; Completed 11/10/14  Vision Screening: Recommended annual ophthalmology exams for early detection of glaucoma and other  disorders of the eye. Is the patient up to date with their annual eye exam?  Yes  Who is the provider or what is the name of the office in which the patient attends annual eye exams? Deferred If pt is not established with a provider, would they like to be referred to a provider to establish care? No .   Dental Screening: Recommended annual dental exams for proper oral hygiene  Community Resource Referral / Chronic Care Management:  CRR required this visit?  No   CCM required this visit?  No      Plan:     I have personally reviewed and noted the following in the patient's chart:   Medical and social history Use of alcohol, tobacco or illicit drugs  Current medications and supplements including opioid prescriptions. Patient is not currently taking opioid prescriptions. Functional ability and status Nutritional status Physical activity Advanced directives List of other physicians Hospitalizations, surgeries, and ER visits in previous 12 months Vitals Screenings to include cognitive, depression,  and falls Referrals and appointments  In addition, I have reviewed and discussed with patient certain preventive protocols, quality metrics, and best practice recommendations. A written personalized care plan for preventive services as well as general preventive health recommendations were provided to patient.     Tillie Rung, LPN   9/60/4540   Nurse Notes: None

## 2022-07-21 ENCOUNTER — Other Ambulatory Visit: Payer: Self-pay | Admitting: Family Medicine

## 2022-07-21 DIAGNOSIS — M816 Localized osteoporosis [Lequesne]: Secondary | ICD-10-CM

## 2022-07-22 ENCOUNTER — Ambulatory Visit: Payer: Medicare Other | Attending: Cardiovascular Disease | Admitting: Cardiovascular Disease

## 2022-07-22 ENCOUNTER — Encounter: Payer: Self-pay | Admitting: Cardiovascular Disease

## 2022-07-22 VITALS — BP 132/76 | HR 79 | Ht 71.0 in | Wt 203.0 lb

## 2022-07-22 DIAGNOSIS — I4819 Other persistent atrial fibrillation: Secondary | ICD-10-CM

## 2022-07-22 DIAGNOSIS — I4892 Unspecified atrial flutter: Secondary | ICD-10-CM | POA: Diagnosis not present

## 2022-07-22 NOTE — Patient Instructions (Signed)
Medication Instructions:  Your physician recommends that you continue on your current medications as directed. Please refer to the Current Medication list given to you today. *If you need a refill on your cardiac medications before your next appointment, please call your pharmacy*   Follow-Up: At [facility], you and your health needs are our priority.  As part of our continuing mission to provide you with exceptional heart care, we have created designated Provider Care Teams.  These Care Teams include your primary Cardiologist (physician) and Advanced Practice Providers (APPs -  Physician Assistants and Nurse Practitioners) who all work together to provide you with the care you need, when you need it.  We recommend signing up for the patient portal called "MyChart".  Sign up information is provided on this After Visit Summary.  MyChart is used to connect with patients for Virtual Visits (Telemedicine).  Patients are able to view lab/test results, encounter notes, upcoming appointments, etc.  Non-urgent messages can be sent to your provider as well.   To learn more about what you can do with MyChart, go to [email or web address].    Your next appointment:   1 year   Provider:   York Pellant, MD

## 2022-07-22 NOTE — Progress Notes (Signed)
Cardiology Office Note:    Date:  07/22/2022   ID:  Samuel Mayo, Samuel Mayo 1945-08-24, MRN [id number]  PCP:  [name], [name], MD   [facility] Providers Cardiologist:  [name], MD     Referring MD: [name], [name], MD   Chief complaint: "I feel fine"  History of Present Illness:    Samuel Mayo is a 77 y.o. male with a hx of mitral regurgitation status post mitral valve annuloplasty, atypical atrial flutter, atrial fibrillation referred for arrhythmia management.  Seen by Dr. Graciela Husbands in 2022 and noted to have multiple flutter morphologies, so he was thought not to be a good candidate for CTI ablation.  DC cardioversion in 2023.  He then developed atrial flutter and underwent another DC cardioversion in August 2023.  He reports that he has very minimal symptoms with atrial fibrillation and flutter and often cannot tell if he is in or out of rhythm.  He typically walks about 3 miles in the mornings and does puzzles or reading in the afternoon.  He has not noticed any correlation between his energy levels or ability to walk and his rhythm.  He occasionally checks his rhythm with a cardia device and has a blood pressure machine.  Typically, this is the only way he knows if he is in atrial fibrillation or regular rhythm.  He reports that he has been doing very well since our last visit. He has not had arrhythmia notifications from his Kardia device, no symptoms of fibrillation or flutter.   Past Medical History:  Diagnosis Date   AK (actinic keratosis)    Allergy    Chicken pox    Compression fracture of L1 lumbar vertebra (HCC)    Compression fracture of thoracic vertebra (HCC)    Hypertension    Lentigines    Follows with DR Culton (derma)   Seizures (HCC)     Past Surgical History:  Procedure Laterality Date   CARDIOVERSION N/A 05/14/2021   Procedure: CARDIOVERSION;  Surgeon: Little Ishikawa, MD;  Location: [facility] ENDOSCOPY;  Service: Cardiovascular;   Laterality: N/A;   CARDIOVERSION N/A 10/08/2021   Procedure: CARDIOVERSION;  Surgeon: Christell Constant, MD;  Location: [facility] ENDOSCOPY;  Service: Cardiovascular;  Laterality: N/A;   COLONOSCOPY     mitral vavle     Congetinal mitral valve disease.    Current Medications: Current Meds  Medication Sig   amLODipine (NORVASC) 5 MG tablet TAKE 1 TABLET (5 MG TOTAL) BY MOUTH DAILY.   apixaban (ELIQUIS) 5 MG TABS tablet Take 1 tablet (5 mg total) by mouth 2 (two) times daily.   beta carotene w/minerals (OCUVITE) tablet Take 1 tablet by mouth daily.   Cholecalciferol (VITAMIN D3) 50 MCG (2000 UT) TABS Take 2,000 tablets by mouth every evening.   divalproex (DEPAKOTE) 250 MG DR tablet Take 2 tablets (500 mg total) by mouth 2 (two) times daily.   divalproex (DEPAKOTE) 500 MG DR tablet Take 1 tablet (500 mg total) by mouth 2 (two) times daily.   gabapentin (NEURONTIN) 300 MG capsule Take 1 capsule (300 mg total) by mouth at bedtime.   Homeopathic Products (ARNICARE) GEL Apply 1 Application topically 3 (three) times a week. On Ankle   ibandronate (BONIVA) 150 MG tablet TAKE 1 TABLET BY MOUTH EVERY 30 DAYS. TAKE IN AM WITH FULL GLASS OF WATER ON EMPTY STOMACH AND DON'T TAKE ANYTHING ELSE BY MOUTH OR LIE DOWN FOR THE NEXT 30 MINUTES   metoprolol succinate (TOPROL-XL) 25 MG  24 hr tablet TAKE 1 TABLET (25 MG TOTAL) BY MOUTH DAILY.   Multiple Vitamin (CALCIUM COMPLEX PO) Take 1 tablet by mouth 2 (two) times daily. Bone Up [place]arrow Formulas   Multiple Vitamin (MULTIVITAMIN WITH MINERALS) TABS tablet Take 1 tablet by mouth 2 (two) times daily. Vitamin Code Whole Food Multivitamin   risperiDONE (RISPERDAL) 0.25 MG tablet Take 0.25 mg by mouth at bedtime.     Allergies:   Other   Social History   Socioeconomic History   Marital status: Domestic Partner    Spouse name: [name]   Number of children: Not on file   Years of education: Not on file   Highest education level: Bachelor's degree (e.g., BA, AB,  BS)  Occupational History   Not on file  Tobacco Use   Smoking status: Never   Smokeless tobacco: Never   Tobacco comments:    Never smoke 10/29/21   Substance and Sexual Activity   Alcohol use: Yes    Alcohol/week: 14.0 standard drinks of alcohol    Types: 14 Standard drinks or equivalent per week    Comment: 2 glasses of wine nightly 10/29/21   Drug use: No   Sexual activity: Not on file  Other Topics Concern   Not on file  Social History Narrative   Lives with wife [name]   R handed   Caffeine: 3 C of coffee AM   Social Determinants of Health   Financial Resource Strain: Low Risk  (07/17/2022)   Overall Financial Resource Strain (CARDIA)    Difficulty of Paying Living Expenses: Not hard at all  Food Insecurity: No Food Insecurity (07/17/2022)   Hunger Vital Sign    Worried About Running Out of Food in the Last Year: Never true    Ran Out of Food in the Last Year: Never true  Transportation Needs: No Transportation Needs (07/17/2022)   PRAPARE - Administrator, Civil Service (Medical): No    Lack of Transportation (Non-Medical): No  Physical Activity: Sufficiently Active (07/17/2022)   Exercise Vital Sign    Days of Exercise per Week: 7 days    Minutes of Exercise per Session: 30 min  Stress: No Stress Concern Present (07/17/2022)   [employer] of Occupational Health - Occupational Stress Questionnaire    Feeling of Stress : Not at all  Social Connections: Socially Integrated (07/17/2022)   Social Connection and Isolation Panel [NHANES]    Frequency of Communication with Friends and Family: More than three times a week    Frequency of Social Gatherings with Friends and Family: More than three times a week    Attends Religious Services: More than 4 times per year    Active Member of [employer] or Organizations: Yes    Attends Engineer, structural: More than 4 times per year    Marital Status: Married     Family History: The patient's family history  includes Aneurysm in his paternal grandfather; Cancer in his father; Heart disease in his father, mother, and son; Prostate cancer in his father. There is no history of Colon cancer, Esophageal cancer, Stomach cancer, or Rectal cancer.  ROS:   Please see the history of present illness.    All other systems reviewed and are negative.  EKGs/Labs/Other Studies Reviewed:     TTE 04/03/21   1. There is a prosthetic annuloplasty ring present in the mitral position. Procedure Date: 2003. Location: [place], [place]   2. Right ventricular systolic function is low normal. The  right ventricular size is moderately enlarged. There is normal pulmonary artery systolic pressure.   3. Left ventricular ejection fraction, by estimation, is 50 to 55%. The  left ventricle has low normal function. The left ventricle has no regional  wall motion abnormalities. Left ventricular diastolic function could not  be evaluated.   4. Aortic dilatation noted. There is mild dilatation of the ascending  aorta, measuring 41 mm.   5. The aortic valve is tricuspid. There is mild calcification of the  aortic valve. There is mild thickening of the aortic valve. Aortic valve  regurgitation is not visualized. Aortic valve sclerosis is present, with  no evidence of aortic valve stenosis.   6. Tricuspid valve regurgitation is moderate.   7. Left atrial size was mild to moderately dilated.   8. Right atrial size was severely dilated.   9. The inferior vena cava is dilated in size with <50% respiratory  variability, suggesting right atrial pressure of 15 mmHg.  EKG:  Last EKG results: Sinus rhythm with first degree AV block, HR 79 bpm.   Recent Labs: 05/20/2022: ALT 20; BUN 24; Creatinine, Ser 1.22; Hemoglobin 13.6; Platelets 201; Potassium 4.5; Sodium 141    Risk Assessment/Calculations:    CHA2DS2-VASc Score =     This indicates a  % annual risk of stroke. The patient's score is based upon:          Physical Exam:     VS:  BP 132/76   Pulse 79   Ht 5\' 11"  (1.803 m)   Wt 203 lb (92.1 kg)   SpO2 97%   BMI 28.31 kg/m     Wt Readings from Last 3 Encounters:  07/22/22 203 lb (92.1 kg)  07/17/22 205 lb 8 oz (93.2 kg)  05/20/22 204 lb (92.5 kg)     GEN:  Well nourished, well developed in no acute distress CARDIAC: RRR, no murmurs, rubs, gallops RESPIRATORY:  Normal work of breathing MUSCULOSKELETAL:  edema    ASSESSMENT & PLAN:    Atrial flutter: he has had typical-appearing and atypical-appearing flutter.  I am usually fairly aggressive about ablating flutters, but he is likely to have an atypical flutter and would need to be in flutter in order to map the rhythm.  At this point, he is maintaining sinus rhythm.  Because his symptoms are so minimal, and he is prone to having atypical flutters, the indication for ablation is less substantive. Atrial fibrillation: documented on ECGs (eg 7/27). Not a good candidate for AAD due to QT prolongation and potential drug interactions.  S/p mitral valve annuloplasty          Medication Adjustments/Labs and Tests Ordered: Current medicines are reviewed at length with the patient today.  Concerns regarding medicines are outlined above.  No orders of the defined types were placed in this encounter.  No orders of the defined types were placed in this encounter.    Signed, Maurice Small, MD  07/22/2022 10:32 AM    [facility]

## 2022-08-07 ENCOUNTER — Other Ambulatory Visit: Payer: Self-pay | Admitting: Internal Medicine

## 2022-08-07 DIAGNOSIS — I4891 Unspecified atrial fibrillation: Secondary | ICD-10-CM

## 2022-08-07 NOTE — Telephone Encounter (Signed)
Prescription refill request for Eliquis received. Indication: Afib  Last office visit: 07/22/22 ([place])  Scr: 1.22 (05/20/22)  Age: 77 Weight: 92.1kg  Appropriate dose. Refill sent.

## 2022-08-16 ENCOUNTER — Other Ambulatory Visit: Payer: Self-pay | Admitting: Neurology

## 2022-08-22 ENCOUNTER — Ambulatory Visit (INDEPENDENT_AMBULATORY_CARE_PROVIDER_SITE_OTHER): Payer: Medicare Other | Admitting: Family Medicine

## 2022-08-22 ENCOUNTER — Encounter: Payer: Self-pay | Admitting: Family Medicine

## 2022-08-22 VITALS — BP 130/80 | HR 95 | Temp 98.6°F | Wt 197.5 lb

## 2022-08-22 DIAGNOSIS — J069 Acute upper respiratory infection, unspecified: Secondary | ICD-10-CM

## 2022-08-22 LAB — POC COVID19 BINAXNOW: SARS Coronavirus 2 Ag: NEGATIVE

## 2022-08-22 NOTE — Progress Notes (Signed)
   Subjective:    Patient ID: Samuel Mayo, male    DOB: 1945-06-08, 77 y.o.   MRN: [id number]  HPI Here for one week of runny nose and a dry cough. No sinus pressure or ST or fever. Using Robitussin DM with good results. This started after he returned from a [place].    Review of Systems  Constitutional: Negative.   HENT:  Positive for postnasal drip and rhinorrhea. Negative for congestion, ear pain, sinus pressure and sore throat.   Eyes: Negative.   Respiratory:  Positive for cough. Negative for shortness of breath and wheezing.        Objective:   Physical Exam Constitutional:      Appearance: Normal appearance. He is not ill-appearing.  HENT:     Right Ear: Tympanic membrane, ear canal and external ear normal.     Left Ear: Tympanic membrane, ear canal and external ear normal.     Nose: Nose normal.     Mouth/Throat:     Pharynx: Oropharynx is clear.  Eyes:     Conjunctiva/sclera: Conjunctivae normal.  Pulmonary:     Effort: Pulmonary effort is normal.     Breath sounds: Normal breath sounds.  Lymphadenopathy:     Cervical: No cervical adenopathy.  Neurological:     Mental Status: He is alert.           Assessment & Plan:  Viral URI. This should resolve soon on its own. Recheck as needed.  Samuel Crane, MD

## 2022-08-22 NOTE — Addendum Note (Signed)
Addended by: Carola Rhine on: 08/22/2022 10:27 AM   Modules accepted: Orders

## 2022-08-26 ENCOUNTER — Telehealth: Payer: Self-pay | Admitting: Neurology

## 2022-08-26 MED ORDER — GABAPENTIN 300 MG PO CAPS: 300.0000 mg | ORAL_CAPSULE | Freq: Every day | ORAL | 5 refills | Status: DC

## 2022-08-26 MED ORDER — DIVALPROEX SODIUM 250 MG PO DR TAB: 250.0000 mg | DELAYED_RELEASE_TABLET | Freq: Two times a day (BID) | ORAL | 5 refills | Status: DC

## 2022-08-26 NOTE — Telephone Encounter (Addendum)
Pt called wanting to discuss his divalproex (DEPAKOTE) with the RN Pt states that on his mychart it states one way to take the 250 and 500 and per provider instructions it states another way. Pt would like clarification. He is also needing  a refill on his  gabapentin (NEURONTIN) 300 MG capsule and is needing the request sent to the [facility] on [address]. Please advise.

## 2022-08-26 NOTE — Telephone Encounter (Signed)
Per the last ov the patient was recommended to increase to 750 mg BID. This is one 500 mg and 1 250 mg tablet twice a day to equal that since the medication doesn't come in the 750 mg strength.

## 2022-08-26 NOTE — Telephone Encounter (Signed)
Pt returned phone. Would like a call back 

## 2022-08-27 NOTE — Telephone Encounter (Signed)
Pt called back. I informed him of message nurse [facility] sent. Pt said okay I got it this time.

## 2022-09-13 ENCOUNTER — Encounter: Payer: Self-pay | Admitting: Neurology

## 2022-09-16 ENCOUNTER — Other Ambulatory Visit: Payer: Self-pay | Admitting: Anesthesiology

## 2022-09-16 MED ORDER — GABAPENTIN 300 MG PO CAPS: 300.0000 mg | ORAL_CAPSULE | Freq: Every day | ORAL | 1 refills | Status: DC

## 2022-09-21 ENCOUNTER — Other Ambulatory Visit: Payer: Self-pay | Admitting: Internal Medicine

## 2022-10-18 DIAGNOSIS — L570 Actinic keratosis: Secondary | ICD-10-CM | POA: Diagnosis not present

## 2022-10-18 DIAGNOSIS — L820 Inflamed seborrheic keratosis: Secondary | ICD-10-CM | POA: Diagnosis not present

## 2022-10-18 DIAGNOSIS — Z85828 Personal history of other malignant neoplasm of skin: Secondary | ICD-10-CM | POA: Diagnosis not present

## 2022-10-18 DIAGNOSIS — L821 Other seborrheic keratosis: Secondary | ICD-10-CM | POA: Diagnosis not present

## 2022-10-18 DIAGNOSIS — D2262 Melanocytic nevi of left upper limb, including shoulder: Secondary | ICD-10-CM | POA: Diagnosis not present

## 2022-10-18 DIAGNOSIS — D1801 Hemangioma of skin and subcutaneous tissue: Secondary | ICD-10-CM | POA: Diagnosis not present

## 2022-10-18 DIAGNOSIS — D225 Melanocytic nevi of trunk: Secondary | ICD-10-CM | POA: Diagnosis not present

## 2022-10-21 ENCOUNTER — Ambulatory Visit (INDEPENDENT_AMBULATORY_CARE_PROVIDER_SITE_OTHER): Payer: Medicare Other | Admitting: Family Medicine

## 2022-10-21 VITALS — BP 106/60 | HR 82 | Temp 97.9°F | Ht 71.0 in | Wt 198.9 lb

## 2022-10-21 DIAGNOSIS — R17 Unspecified jaundice: Secondary | ICD-10-CM

## 2022-10-21 NOTE — Progress Notes (Signed)
Established Patient Office Visit  Subjective   Patient ID: Samuel Mayo, male    DOB: Dec 25, 1945  Age: 77 y.o. MRN: [id number]  Chief Complaint  Patient presents with   Jaundice    HPI   Mr Wavra is seen upon recommendation of his dermatologist to get checked out for possible jaundice.  He was seen there recently for skin check and they had concerns that he may have some early jaundice.  He has not noted any abnormalities with his urine in terms of dark tea colored urine or lighter colored stools.  Denies any abdominal pain.  No nausea or vomiting.  Appetite is good and weight stable.  No new medications.  His regular medications include amlodipine, Depakote for seizure disorder, Eliquis for history of atrial fibrillation, gabapentin, Toprol-XL.  No recent travels.  No history of gallbladder issues.  Past Medical History:  Diagnosis Date   AK (actinic keratosis)    Allergy    Chicken pox    Compression fracture of L1 lumbar vertebra (HCC)    Compression fracture of thoracic vertebra (HCC)    Hypertension    Lentigines    Follows with DR Culton (derma)   Seizures (HCC)    Past Surgical History:  Procedure Laterality Date   CARDIOVERSION N/A 05/14/2021   Procedure: CARDIOVERSION;  Surgeon: Little Ishikawa, MD;  Location: [facility] ENDOSCOPY;  Service: Cardiovascular;  Laterality: N/A;   CARDIOVERSION N/A 10/08/2021   Procedure: CARDIOVERSION;  Surgeon: Christell Constant, MD;  Location: [facility] ENDOSCOPY;  Service: Cardiovascular;  Laterality: N/A;   COLONOSCOPY     mitral vavle     Congetinal mitral valve disease.    reports that he has never smoked. He has never used smokeless tobacco. He reports current alcohol use of about 14.0 standard drinks of alcohol per week. He reports that he does not use drugs. family history includes Aneurysm in his paternal grandfather; Cancer in his father; Heart disease in his father, mother, and son; Prostate cancer in his father. Allergies   Allergen Reactions   Other Other (See Comments)    Neosporin Ophthalmic causes eyes to turn red    Review of Systems  Constitutional:  Negative for chills, fever and weight loss.  Respiratory:  Negative for cough and shortness of breath.   Cardiovascular:  Negative for chest pain.  Gastrointestinal:  Negative for abdominal pain, blood in stool, constipation, diarrhea, nausea and vomiting.  Genitourinary:  Negative for hematuria.      Objective:     BP 106/60 (BP Location: Left Arm, Patient Position: Sitting, Cuff Size: Normal)   Pulse 82   Temp 97.9 F (36.6 C) (Oral)   Ht 5\' 11"  (1.803 m)   Wt 198 lb 14.4 oz (90.2 kg)   SpO2 98%   BMI 27.74 kg/m  BP Readings from Last 3 Encounters:  10/21/22 106/60  08/22/22 130/80  07/22/22 132/76   Wt Readings from Last 3 Encounters:  10/21/22 198 lb 14.4 oz (90.2 kg)  08/22/22 197 lb 8 oz (89.6 kg)  07/22/22 203 lb (92.1 kg)      Physical Exam Vitals reviewed.  Constitutional:      General: He is not in acute distress.    Appearance: Normal appearance. He is not ill-appearing.  Eyes:     Comments: No obvious scleral icterus.  Cardiovascular:     Rate and Rhythm: Normal rate and regular rhythm.  Pulmonary:     Effort: Pulmonary effort is normal.  Breath sounds: Normal breath sounds.  Abdominal:     Palpations: Abdomen is soft.     Tenderness: There is no abdominal tenderness. There is no guarding or rebound.     Comments: No hepatomegaly or splenomegaly noted.  Abdomen is nontender.  Skin:    Comments: His skin does have a bit of yellow tent especially involving the head neck and upper trunk but not involving his lower extremities.  Neurological:     Mental Status: He is alert.      No results found for any visits on 10/21/22.  Last CBC Lab Results  Component Value Date   WBC 3.9 05/20/2022   HGB 13.6 05/20/2022   HCT 39.3 05/20/2022   MCV 98 (H) 05/20/2022   MCH 33.9 (H) 05/20/2022   RDW 11.9 05/20/2022    PLT 201 05/20/2022   Last metabolic panel Lab Results  Component Value Date   GLUCOSE 107 (H) 05/20/2022   NA 141 05/20/2022   K 4.5 05/20/2022   CL 101 05/20/2022   CO2 25 05/20/2022   BUN 24 05/20/2022   CREATININE 1.22 05/20/2022   EGFR 61 05/20/2022   CALCIUM 8.9 05/20/2022   PROT 5.9 (L) 05/20/2022   ALBUMIN 3.9 05/20/2022   LABGLOB 2.0 05/20/2022   AGRATIO 2.0 05/20/2022   BILITOT 0.4 05/20/2022   ALKPHOS 62 05/20/2022   AST 26 05/20/2022   ALT 20 05/20/2022   ANIONGAP 10 04/14/2020      The ASCVD Risk score (Arnett DK, et al., 2019) failed to calculate for the following reasons:   Cannot find a previous HDL lab   Cannot find a previous total cholesterol lab    Assessment & Plan:   Suspected relatively mild jaundice.  This was noted clinically by his astute dermatologist.  Fortunately, he does not have any worrisome symptoms such as fever, abdominal pain, nausea, vomiting, etc.  -Start with labs with CBC and CMP. -If bilirubin elevated may need further evaluation such as ultrasound -Follow-up immediately for any abdominal pain, fever, or other concerns   No follow-ups on file.    Evelena Peat, MD

## 2022-10-22 LAB — CBC WITH DIFFERENTIAL/PLATELET
Basophils Absolute: 0 10*3/uL (ref 0.0–0.1)
Basophils Relative: 0.8 % (ref 0.0–3.0)
Eosinophils Absolute: 0.1 10*3/uL (ref 0.0–0.7)
Eosinophils Relative: 2.3 % (ref 0.0–5.0)
HCT: 39.2 % (ref 39.0–52.0)
Hemoglobin: 13.1 g/dL (ref 13.0–17.0)
Lymphocytes Relative: 32.5 % (ref 12.0–46.0)
Lymphs Abs: 1.6 10*3/uL (ref 0.7–4.0)
MCHC: 33.5 g/dL (ref 30.0–36.0)
MCV: 97.4 fl (ref 78.0–100.0)
Monocytes Absolute: 0.7 10*3/uL (ref 0.1–1.0)
Monocytes Relative: 15.6 % — ABNORMAL HIGH (ref 3.0–12.0)
Neutro Abs: 2.3 10*3/uL (ref 1.4–7.7)
Neutrophils Relative %: 48.8 % (ref 43.0–77.0)
Platelets: 191 10*3/uL (ref 150.0–400.0)
RBC: 4.03 Mil/uL — ABNORMAL LOW (ref 4.22–5.81)
RDW: 13.8 % (ref 11.5–15.5)
WBC: 4.8 10*3/uL (ref 4.0–10.5)

## 2022-10-22 LAB — COMPREHENSIVE METABOLIC PANEL
ALT: 27 U/L (ref 0–53)
AST: 34 U/L (ref 0–37)
Albumin: 3.9 g/dL (ref 3.5–5.2)
Alkaline Phosphatase: 53 U/L (ref 39–117)
BUN: 24 mg/dL — ABNORMAL HIGH (ref 6–23)
CO2: 26 meq/L (ref 19–32)
Calcium: 8.9 mg/dL (ref 8.4–10.5)
Chloride: 96 meq/L (ref 96–112)
Creatinine, Ser: 1.09 mg/dL (ref 0.40–1.50)
GFR: 64.93 mL/min (ref >60.00)
Glucose, Bld: 89 mg/dL (ref 70–99)
Potassium: 4.5 meq/L (ref 3.5–5.1)
Sodium: 130 meq/L — ABNORMAL LOW (ref 135–145)
Total Bilirubin: 0.6 mg/dL (ref 0.2–1.2)
Total Protein: 7 g/dL (ref 6.0–8.3)

## 2022-11-07 ENCOUNTER — Telehealth: Payer: Self-pay | Admitting: Neurology

## 2022-11-07 ENCOUNTER — Ambulatory Visit: Payer: Medicare Other | Admitting: Neurology

## 2022-11-07 NOTE — Telephone Encounter (Signed)
Returned pt's call to request more information regarding his prescription for Risperdal 0.25 mg. Pt stated that his previous Neurologist Dr Malvin Johns from [facility] use to prescribe this to help with his seizures. Pt has run out of medication and is now questioning if he still needs to continue taking it. States Dr Pearlean Brownie has him now on Depakote 750 mg BID for seizures. Pt would like to know if he needs to continue the Risperdal 0.25 mg or stop it completely. If he needs to continue he would need a refill.  Please advise.

## 2022-11-07 NOTE — Telephone Encounter (Signed)
Pt has called and r/s his appointment that was set for today , and is on wait list.  Pt states during today's appointment he was going to ask Dr Pearlean Brownie about managing his  risperiDONE (RISPERDAL) 0.25 MG tablet, pt would like a call to discuss this request.  Pt states this is a medication he will soon run out of and would like Dr Pearlean Brownie to manage it for him going forward.

## 2022-11-21 NOTE — Telephone Encounter (Signed)
Left message for pt to call back  °

## 2022-12-23 ENCOUNTER — Ambulatory Visit (INDEPENDENT_AMBULATORY_CARE_PROVIDER_SITE_OTHER): Payer: Medicare Other | Admitting: Family Medicine

## 2022-12-23 ENCOUNTER — Encounter: Payer: Self-pay | Admitting: Family Medicine

## 2022-12-23 VITALS — BP 118/70 | HR 100 | Temp 99.4°F | Resp 16 | Ht 71.0 in | Wt 197.1 lb

## 2022-12-23 DIAGNOSIS — R17 Unspecified jaundice: Secondary | ICD-10-CM

## 2022-12-23 DIAGNOSIS — R509 Fever, unspecified: Secondary | ICD-10-CM | POA: Diagnosis not present

## 2022-12-23 DIAGNOSIS — Z5181 Encounter for therapeutic drug level monitoring: Secondary | ICD-10-CM | POA: Diagnosis not present

## 2022-12-23 DIAGNOSIS — R0789 Other chest pain: Secondary | ICD-10-CM

## 2022-12-23 LAB — BASIC METABOLIC PANEL
BUN: 23 mg/dL (ref 6–23)
CO2: 27 meq/L (ref 19–32)
Calcium: 9.1 mg/dL (ref 8.4–10.5)
Chloride: 95 meq/L — ABNORMAL LOW (ref 96–112)
Creatinine, Ser: 1.12 mg/dL (ref 0.40–1.50)
GFR: 62.78 mL/min (ref >60.00)
Glucose, Bld: 111 mg/dL — ABNORMAL HIGH (ref 70–99)
Potassium: 4.4 meq/L (ref 3.5–5.1)
Sodium: 131 meq/L — ABNORMAL LOW (ref 135–145)

## 2022-12-23 LAB — URINALYSIS, ROUTINE W REFLEX MICROSCOPIC
Hgb urine dipstick: NEGATIVE
Ketones, ur: 15 — AB
Leukocytes,Ua: NEGATIVE
Nitrite: NEGATIVE
RBC / HPF: NONE SEEN (ref 0–?)
Specific Gravity, Urine: 1.02 (ref 1.000–1.030)
Urine Glucose: NEGATIVE
Urobilinogen, UA: 2 — AB (ref 0.0–1.0)
pH: 6 (ref 5.0–8.0)

## 2022-12-23 LAB — HEPATIC FUNCTION PANEL
ALT: 108 U/L — ABNORMAL HIGH (ref 0–53)
AST: 49 U/L — ABNORMAL HIGH (ref 0–37)
Albumin: 3.6 g/dL (ref 3.5–5.2)
Alkaline Phosphatase: 303 U/L — ABNORMAL HIGH (ref 39–117)
Bilirubin, Direct: 0.3 mg/dL (ref 0.0–0.3)
Total Bilirubin: 0.7 mg/dL (ref 0.2–1.2)
Total Protein: 6.6 g/dL (ref 6.0–8.3)

## 2022-12-23 LAB — CBC WITH DIFFERENTIAL/PLATELET
Basophils Absolute: 0 10*3/uL (ref 0.0–0.1)
Basophils Relative: 0.4 % (ref 0.0–3.0)
Eosinophils Absolute: 0 10*3/uL (ref 0.0–0.7)
Eosinophils Relative: 0.4 % (ref 0.0–5.0)
HCT: 39.5 % (ref 39.0–52.0)
Hemoglobin: 13 g/dL (ref 13.0–17.0)
Lymphocytes Relative: 11.1 % — ABNORMAL LOW (ref 12.0–46.0)
Lymphs Abs: 0.7 10*3/uL (ref 0.7–4.0)
MCHC: 33 g/dL (ref 30.0–36.0)
MCV: 99.7 fL (ref 78.0–100.0)
Monocytes Absolute: 1.5 10*3/uL — ABNORMAL HIGH (ref 0.1–1.0)
Monocytes Relative: 23.4 % — ABNORMAL HIGH (ref 3.0–12.0)
Neutro Abs: 4.3 10*3/uL (ref 1.4–7.7)
Neutrophils Relative %: 64.7 % (ref 43.0–77.0)
Platelets: 209 10*3/uL (ref 150.0–400.0)
RBC: 3.96 Mil/uL — ABNORMAL LOW (ref 4.22–5.81)
RDW: 14.2 % (ref 11.5–15.5)
WBC: 6.6 10*3/uL (ref 4.0–10.5)

## 2022-12-23 LAB — AMYLASE: Amylase: 64 U/L (ref 27–131)

## 2022-12-23 LAB — POC COVID19 BINAXNOW: SARS Coronavirus 2 Ag: NEGATIVE

## 2022-12-23 LAB — C-REACTIVE PROTEIN: CRP: 10.9 mg/dL (ref 0.5–20.0)

## 2022-12-23 LAB — LIPASE: Lipase: 45 U/L (ref 11.0–59.0)

## 2022-12-23 NOTE — Patient Instructions (Addendum)
A few things to remember from today's visit:  Fever, unspecified - Plan: POC COVID-19, Urinalysis, Routine w reflex microscopic, C-reactive protein  Jaundice - Plan: Basic metabolic panel, Urinalysis, Routine w reflex microscopic, CBC with Differential/Platelet, Hepatic function panel, Valproic Acid Level, US ABDOMEN LIMITED RUQ (LIVER/GB), Lipase, Amylase, C-reactive protein  Encounter for medication monitoring - Plan: Valproic Acid Level  Other chest pain - Plan: DG Chest 2 View, Lipase  If you need refills for medications you take chronically, please call your pharmacy. Do not use My Chart to request refills or for acute issues that need immediate attention. If you send a my chart message, it may take a few days to be addressed, specially if I am not in the office.  Please be sure medication list is accurate. If a new problem present, please set up appointment sooner than planned today.

## 2022-12-23 NOTE — Progress Notes (Signed)
ACUTE VISIT Chief Complaint  Patient presents with   Follow-up   HPI: [name] is a 77 y.o. male with a PMHx significant for HTN, seizure disorder, vitamin D deficiency, osteoporosis, and PAF on chronic anticoagulation, who is here today following up from a visit on 8/19 for jaundice.   He was seen by Dr. Caryl Mayo on 10/21/2022 for jaundice after a visit to his dermatologist. He states that he has noted yellowish skin coloration but he does not have a problem with his appearance.In general he is feeling fine but mentions feeling more fatigue for the past few weeks.   He sees his dermatologist once per year.  No recent travel.  He states he has been experiencing fatigue recently. He says he has not been able to walk his usual 3 miles per day.  He mentions he had a fever of 101 for a few days recently, and believes this may have contributed to his fatigue.   He has not been tested for Covid.  He says his sleep is not very consistent. He occasionally naps after lunch since the start of this fatigue.  He mentions his urine has been slightly darker lately.  He denies any headache, sore throat, cough, exertional chest pain, SOB, palpitations, abdominal pain, nausea, heartburn, or abnormal weight loss.  Negative for heartburn but reports occasional retrosternal CP, not radiated and with no associated symptoms. He has not identified exacerbating or alleviating factors.  No sick contact. Recently received his last COVID 19 vaccine.  Seizure disorder on Depakote 500 mg bid and Gabapentin 300 mg daily at bedtime. He follows with neurologist regularly. He does not drive.  Hypertension: He is currently taking amlodipine 5 mg daily.  BP readings at home: He checks his BP at home. His normal readings are 110s-120s/70s.  Side effects: none  Lab Results  Component Value Date   NA 130 (L) 10/21/2022   CL 96 10/21/2022   K 4.5 10/21/2022   CO2 26 10/21/2022   BUN 24 (H) 10/21/2022    CREATININE 1.09 10/21/2022   GFR 64.93 10/21/2022   CALCIUM 8.9 10/21/2022   ALBUMIN 3.9 10/21/2022   GLUCOSE 89 10/21/2022   Lab Results  Component Value Date   ALT 27 10/21/2022   AST 34 10/21/2022   ALKPHOS 53 10/21/2022   BILITOT 0.6 10/21/2022   Lab Results  Component Value Date   WBC 4.8 10/21/2022   HGB 13.1 10/21/2022   HCT 39.2 10/21/2022   MCV 97.4 10/21/2022   PLT 191.0 10/21/2022   Review of Systems  Constitutional:  Positive for activity change and fatigue. Negative for appetite change, chills, fever and unexpected weight change.  HENT:  Negative for mouth sores, nosebleeds and trouble swallowing.   Respiratory:  Negative for wheezing.   Cardiovascular:  Negative for leg swelling.  Gastrointestinal:  Negative for vomiting.  Endocrine: Negative for cold intolerance and heat intolerance.  Genitourinary:  Negative for decreased urine volume, dysuria and hematuria.  Skin:  Negative for rash.  Neurological:  Negative for seizures, facial asymmetry and weakness.  Psychiatric/Behavioral:  Negative for confusion and hallucinations.   See other pertinent positives and negatives in HPI.  Current Outpatient Medications on File Prior to Visit  Medication Sig Dispense Refill   amLODipine (NORVASC) 5 MG tablet TAKE 1 TABLET (5 MG TOTAL) BY MOUTH DAILY. 90 tablet 3   beta carotene w/minerals (OCUVITE) tablet Take 1 tablet by mouth daily.     Cholecalciferol (VITAMIN D3) 50 MCG (2000  UT) TABS Take 2,000 tablets by mouth every evening.     divalproex (DEPAKOTE) 250 MG DR tablet Take 1 tablet (250 mg total) by mouth 2 (two) times daily. Take in addition to 500 mg twice a day (pt should have a total of 750 mg twice a day) 60 tablet 5   divalproex (DEPAKOTE) 500 MG DR tablet Take 1 tablet (500 mg total) by mouth 2 (two) times daily. 180 tablet 2   ELIQUIS 5 MG TABS tablet TAKE 1 TABLET BY MOUTH TWICE A DAY 60 tablet 5   gabapentin (NEURONTIN) 300 MG capsule Take 1 capsule (300 mg  total) by mouth at bedtime. 90 capsule 1   Homeopathic Products (ARNICARE) GEL Apply 1 Application topically 3 (three) times a week. On Ankle     ibandronate (BONIVA) 150 MG tablet TAKE 1 TABLET BY MOUTH EVERY 30 DAYS. TAKE IN AM WITH FULL GLASS OF WATER ON EMPTY STOMACH AND DON'T TAKE ANYTHING ELSE BY MOUTH OR LIE DOWN FOR THE NEXT 30 MINUTES 9 tablet 3   metoprolol succinate (TOPROL-XL) 25 MG 24 hr tablet TAKE 1 TABLET (25 MG TOTAL) BY MOUTH DAILY. 90 tablet 3   Multiple Vitamin (CALCIUM COMPLEX PO) Take 1 tablet by mouth 2 (two) times daily. Bone Up [place] Formulas     Multiple Vitamin (MULTIVITAMIN WITH MINERALS) TABS tablet Take 1 tablet by mouth 2 (two) times daily. Vitamin Code Whole Food Multivitamin     No current facility-administered medications on file prior to visit.   Past Medical History:  Diagnosis Date   AK (actinic keratosis)    Allergy    Chicken pox    Compression fracture of L1 lumbar vertebra (HCC)    Compression fracture of thoracic vertebra (HCC)    Hypertension    Lentigines    Follows with DR Samuel Mayo (derma)   Seizures (HCC)    Allergies  Allergen Reactions   Other Other (See Comments)    Neosporin Ophthalmic causes eyes to turn red   Social History   Socioeconomic History   Marital status: Domestic Partner    Spouse name: Samuel Mayo   Number of children: Not on file   Years of education: Not on file   Highest education level: Bachelor's degree (e.g., BA, AB, BS)  Occupational History   Not on file  Tobacco Use   Smoking status: Mayo   Smokeless tobacco: Mayo   Tobacco comments:    Mayo smoke 10/29/21   Substance and Sexual Activity   Alcohol use: Yes    Alcohol/week: 14.0 standard drinks of alcohol    Types: 14 Standard drinks or equivalent per week    Comment: 2 glasses of wine nightly 10/29/21   Drug use: No   Sexual activity: Not on file  Other Topics Concern   Not on file  Social History Narrative   Lives with wife Samuel Mayo   R handed    Caffeine: 3 C of coffee AM   Social Determinants of Health   Financial Resource Strain: Low Risk  (10/21/2022)   Overall Financial Resource Strain (CARDIA)    Difficulty of Paying Living Expenses: Not hard at all  Food Insecurity: No Food Insecurity (10/21/2022)   Hunger Vital Sign    Worried About Running Out of Food in the Last Year: Mayo true    Ran Out of Food in the Last Year: Mayo true  Transportation Needs: Unknown (10/21/2022)   PRAPARE - Administrator, Civil Service (Medical): No  Lack of Transportation (Non-Medical): Patient declined  Physical Activity: Insufficiently Active (10/21/2022)   Exercise Vital Sign    Days of Exercise per Week: 3 days    Minutes of Exercise per Session: 40 min  Stress: Stress Concern Present (10/21/2022)   [employer] of Occupational Health - Occupational Stress Questionnaire    Feeling of Stress : Rather much  Social Connections: Moderately Integrated (10/21/2022)   Social Connection and Isolation Panel [NHANES]    Frequency of Communication with Friends and Family: Three times a week    Frequency of Social Gatherings with Friends and Family: Once a week    Attends Religious Services: Mayo    Database administrator or Organizations: No    Attends Engineer, structural: More than 4 times per year    Marital Status: Living with partner    Today's Vitals   12/23/22 0944  BP: 118/70  Pulse: 100  Resp: 16  Temp: 99.4 F (37.4 C)  TempSrc: Oral  SpO2: 98%  Weight: 197 lb 2 oz (89.4 kg)  Height: 5\' 11"  (1.803 m)   Body mass index is 27.49 kg/m.  Physical Exam Vitals and nursing note reviewed.  Constitutional:      General: He is not in acute distress.    Appearance: He is well-developed.  HENT:     Head: Normocephalic and atraumatic.     Mouth/Throat:     Lips: No lesions.     Mouth: Mucous membranes are moist.  Eyes:     General: Scleral icterus present.     Conjunctiva/sclera: Conjunctivae normal.   Neck:     Thyroid: No thyroid mass.  Cardiovascular:     Rate and Rhythm: Normal rate and regular rhythm.     Heart sounds: No murmur heard. Pulmonary:     Effort: Pulmonary effort is normal. No respiratory distress.     Breath sounds: Normal breath sounds.  Abdominal:     Palpations: Abdomen is soft. There is no hepatomegaly or mass.     Tenderness: There is no abdominal tenderness.  Musculoskeletal:     Right lower leg: No edema.     Left lower leg: No edema.  Lymphadenopathy:     Cervical: No cervical adenopathy.  Skin:    General: Skin is warm.     Coloration: Skin is jaundiced.     Findings: No erythema or rash.  Neurological:     Mental Status: He is alert and oriented to person, place, and time.     Cranial Nerves: No cranial nerve deficit.     Gait: Gait normal.     Comments: Otherwise stable gait.  Psychiatric:        Mood and Affect: Mood and affect normal.    ASSESSMENT AND PLAN:  Mr. Lohse was seen today for follow up for jaundice.  Lab Results  Component Value Date   ALT 108 (H) 12/23/2022   AST 49 (H) 12/23/2022   ALKPHOS 303 (H) 12/23/2022   BILITOT 0.7 12/23/2022   Lab Results  Component Value Date   NA 131 (L) 12/23/2022   CL 95 (L) 12/23/2022   K 4.4 12/23/2022   CO2 27 12/23/2022   BUN 23 12/23/2022   CREATININE 1.12 12/23/2022   GFR 62.78 12/23/2022   CALCIUM 9.1 12/23/2022   ALBUMIN 3.6 12/23/2022   GLUCOSE 111 (H) 12/23/2022   Lab Results  Component Value Date   WBC 6.6 12/23/2022   HGB 13.0 12/23/2022   HCT 39.5  12/23/2022   MCV 99.7 12/23/2022   PLT 209.0 12/23/2022   Lab Results  Component Value Date   LIPASE 45.0 12/23/2022   Lab Results  Component Value Date   AMYLASE 64 12/23/2022   Lab Results  Component Value Date   CRP 10.9 12/23/2022   Fever, unspecified A couple days ago and resolved. Monitor for new symptoms. COVID 19 test negative.  -     POC COVID-19 BinaxNow -     Urinalysis, Routine w reflex  microscopic; Future -     C-reactive protein; Future  Jaundice Noted earlier 10/2022, no significant abnormalities on CMP and CBC. We discussed possible etiologies. Will repeat LFT's and cbc today plus others to evaluate for possible causes. Depakote can caused hepatotoxicity and pancreatitis, does has not been changed recently.  RUQ [place] will be arranged. Instructed about warning signs.  -     Basic metabolic panel; Future -     Urinalysis, Routine w reflex microscopic; Future -     CBC with Differential/Platelet; Future -     Hepatic function panel; Future -     Valproic acid level; Future -     US ABDOMEN LIMITED RUQ (LIVER/GB); Future -     Lipase; Future -     Amylase; Future -     C-reactive protein; Future  Other chest pain Seldom. Hx does not suggest a serious process. Lung auscultation negative. Because fever a couple days ago,fatigue, and jaundice on examination today CXR ordered.  -     DG Chest 2 View; Future -     Lipase; Future  Encounter for medication monitoring -     Valproic acid level; Future  Return if symptoms worsen or fail to improve, for keep next appointment.  I, Suanne Marker, acting as a scribe for Glynna Failla [name], MD., have documented all relevant documentation on the behalf of Samuel Dacy [name], MD, as directed by  Samuel Tusing [name], MD while in the presence of Samuel Pauli [name], MD.   I, Suanne Marker, have reviewed all documentation for this visit. The documentation on 12/23/22 for the exam, diagnosis, procedures, and orders are all accurate and complete.  Tyronza Happe G. [name], MD  [facility]. [place] office.

## 2022-12-24 LAB — VALPROIC ACID LEVEL: Valproic Acid Lvl: 64.8 mg/L (ref 50.0–100.0)

## 2022-12-26 ENCOUNTER — Ambulatory Visit (HOSPITAL_BASED_OUTPATIENT_CLINIC_OR_DEPARTMENT_OTHER)
Admission: RE | Admit: 2022-12-26 | Discharge: 2022-12-26 | Disposition: A | Discharge status: Home / Self Care | Payer: Medicare Other | Source: Ambulatory Visit | Attending: Family Medicine | Admitting: Family Medicine

## 2022-12-26 ENCOUNTER — Telehealth: Payer: Self-pay | Admitting: Family Medicine

## 2022-12-26 ENCOUNTER — Other Ambulatory Visit: Payer: Self-pay | Admitting: Family Medicine

## 2022-12-26 DIAGNOSIS — K828 Other specified diseases of gallbladder: Secondary | ICD-10-CM | POA: Diagnosis not present

## 2022-12-26 DIAGNOSIS — R17 Unspecified jaundice: Secondary | ICD-10-CM | POA: Diagnosis not present

## 2022-12-26 DIAGNOSIS — R079 Chest pain, unspecified: Secondary | ICD-10-CM | POA: Diagnosis not present

## 2022-12-26 DIAGNOSIS — R0789 Other chest pain: Secondary | ICD-10-CM

## 2022-12-26 DIAGNOSIS — I700 Atherosclerosis of aorta: Secondary | ICD-10-CM | POA: Diagnosis not present

## 2022-12-26 DIAGNOSIS — K838 Other specified diseases of biliary tract: Secondary | ICD-10-CM

## 2022-12-26 DIAGNOSIS — R7989 Other specified abnormal findings of blood chemistry: Secondary | ICD-10-CM

## 2022-12-26 DIAGNOSIS — K810 Acute cholecystitis: Secondary | ICD-10-CM | POA: Diagnosis not present

## 2022-12-26 NOTE — Telephone Encounter (Signed)
Tiffany with radiology called and would like care team to know ultrasound results for pt are available in Epic. She states no call back is needed.

## 2022-12-27 ENCOUNTER — Other Ambulatory Visit (INDEPENDENT_AMBULATORY_CARE_PROVIDER_SITE_OTHER): Payer: Medicare Other

## 2022-12-27 DIAGNOSIS — K838 Other specified diseases of biliary tract: Secondary | ICD-10-CM | POA: Diagnosis not present

## 2022-12-27 DIAGNOSIS — R7989 Other specified abnormal findings of blood chemistry: Secondary | ICD-10-CM

## 2022-12-27 LAB — CBC WITH DIFFERENTIAL/PLATELET
Basophils Absolute: 0 10*3/uL (ref 0.0–0.1)
Basophils Relative: 0.3 % (ref 0.0–3.0)
Eosinophils Absolute: 0 10*3/uL (ref 0.0–0.7)
Eosinophils Relative: 0.1 % (ref 0.0–5.0)
HCT: 39.3 % (ref 39.0–52.0)
Hemoglobin: 12.7 g/dL — ABNORMAL LOW (ref 13.0–17.0)
Lymphocytes Relative: 11.5 % — ABNORMAL LOW (ref 12.0–46.0)
Lymphs Abs: 1.3 10*3/uL (ref 0.7–4.0)
MCHC: 32.3 g/dL (ref 30.0–36.0)
MCV: 101.3 fL — ABNORMAL HIGH (ref 78.0–100.0)
Monocytes Absolute: 2.3 10*3/uL — ABNORMAL HIGH (ref 0.1–1.0)
Monocytes Relative: 20.3 % — ABNORMAL HIGH (ref 3.0–12.0)
Neutro Abs: 7.8 10*3/uL — ABNORMAL HIGH (ref 1.4–7.7)
Neutrophils Relative %: 67.8 % (ref 43.0–77.0)
Platelets: 296 10*3/uL (ref 150.0–400.0)
RBC: 3.88 Mil/uL — ABNORMAL LOW (ref 4.22–5.81)
RDW: 14.1 % (ref 11.5–15.5)
WBC: 11.5 10*3/uL — ABNORMAL HIGH (ref 4.0–10.5)

## 2022-12-27 LAB — BASIC METABOLIC PANEL
BUN: 31 mg/dL — ABNORMAL HIGH (ref 6–23)
CO2: 29 meq/L (ref 19–32)
Calcium: 9 mg/dL (ref 8.4–10.5)
Chloride: 95 meq/L — ABNORMAL LOW (ref 96–112)
Creatinine, Ser: 1.08 mg/dL (ref 0.40–1.50)
GFR: 65.57 mL/min (ref >60.00)
Glucose, Bld: 116 mg/dL — ABNORMAL HIGH (ref 70–99)
Potassium: 4.4 meq/L (ref 3.5–5.1)
Sodium: 132 meq/L — ABNORMAL LOW (ref 135–145)

## 2022-12-27 LAB — HEPATIC FUNCTION PANEL
ALT: 50 U/L (ref 0–53)
AST: 28 U/L (ref 0–37)
Albumin: 3.5 g/dL (ref 3.5–5.2)
Alkaline Phosphatase: 362 U/L — ABNORMAL HIGH (ref 39–117)
Bilirubin, Direct: 0.3 mg/dL (ref 0.0–0.3)
Total Bilirubin: 1 mg/dL (ref 0.2–1.2)
Total Protein: 6.8 g/dL (ref 6.0–8.3)

## 2022-12-29 ENCOUNTER — Ambulatory Visit (HOSPITAL_COMMUNITY)
Admission: RE | Admit: 2022-12-29 | Discharge: 2022-12-29 | Disposition: A | Discharge status: Home / Self Care | Payer: Medicare Other | Source: Ambulatory Visit | Attending: Family Medicine | Admitting: Family Medicine

## 2022-12-29 DIAGNOSIS — Z888 Allergy status to other drugs, medicaments and biological substances status: Secondary | ICD-10-CM | POA: Diagnosis not present

## 2022-12-29 DIAGNOSIS — R7989 Other specified abnormal findings of blood chemistry: Secondary | ICD-10-CM | POA: Insufficient documentation

## 2022-12-29 DIAGNOSIS — I10 Essential (primary) hypertension: Secondary | ICD-10-CM | POA: Diagnosis not present

## 2022-12-29 DIAGNOSIS — M4854XA Collapsed vertebra, not elsewhere classified, thoracic region, initial encounter for fracture: Secondary | ICD-10-CM | POA: Diagnosis not present

## 2022-12-29 DIAGNOSIS — K838 Other specified diseases of biliary tract: Secondary | ICD-10-CM | POA: Insufficient documentation

## 2022-12-29 DIAGNOSIS — I491 Atrial premature depolarization: Secondary | ICD-10-CM | POA: Diagnosis not present

## 2022-12-29 DIAGNOSIS — K810 Acute cholecystitis: Secondary | ICD-10-CM | POA: Diagnosis not present

## 2022-12-29 DIAGNOSIS — Z79899 Other long term (current) drug therapy: Secondary | ICD-10-CM | POA: Diagnosis not present

## 2022-12-29 DIAGNOSIS — Z8249 Family history of ischemic heart disease and other diseases of the circulatory system: Secondary | ICD-10-CM | POA: Diagnosis not present

## 2022-12-29 DIAGNOSIS — K831 Obstruction of bile duct: Secondary | ICD-10-CM | POA: Diagnosis not present

## 2022-12-29 DIAGNOSIS — R17 Unspecified jaundice: Secondary | ICD-10-CM | POA: Diagnosis not present

## 2022-12-29 DIAGNOSIS — Z952 Presence of prosthetic heart valve: Secondary | ICD-10-CM | POA: Diagnosis not present

## 2022-12-29 DIAGNOSIS — K630 Abscess of intestine: Secondary | ICD-10-CM | POA: Diagnosis not present

## 2022-12-29 DIAGNOSIS — I340 Nonrheumatic mitral (valve) insufficiency: Secondary | ICD-10-CM | POA: Diagnosis not present

## 2022-12-29 DIAGNOSIS — I482 Chronic atrial fibrillation, unspecified: Secondary | ICD-10-CM | POA: Diagnosis not present

## 2022-12-29 DIAGNOSIS — M4856XA Collapsed vertebra, not elsewhere classified, lumbar region, initial encounter for fracture: Secondary | ICD-10-CM | POA: Diagnosis not present

## 2022-12-29 DIAGNOSIS — I4892 Unspecified atrial flutter: Secondary | ICD-10-CM | POA: Diagnosis not present

## 2022-12-29 DIAGNOSIS — Z7901 Long term (current) use of anticoagulants: Secondary | ICD-10-CM | POA: Diagnosis not present

## 2022-12-29 DIAGNOSIS — R932 Abnormal findings on diagnostic imaging of liver and biliary tract: Secondary | ICD-10-CM | POA: Diagnosis not present

## 2022-12-29 MED ORDER — GADOBUTROL 1 MMOL/ML IV SOLN
9.0000 mL | Freq: Once | INTRAVENOUS | Status: AC | PRN
Start: 2022-12-29 — End: 2022-12-29
  Administered 2022-12-29: 9 mL via INTRAVENOUS

## 2022-12-30 ENCOUNTER — Encounter (HOSPITAL_BASED_OUTPATIENT_CLINIC_OR_DEPARTMENT_OTHER): Payer: Self-pay

## 2022-12-30 ENCOUNTER — Inpatient Hospital Stay (HOSPITAL_BASED_OUTPATIENT_CLINIC_OR_DEPARTMENT_OTHER)
Admission: EM | Admit: 2022-12-30 | Discharge: 2023-01-03 | DRG: 444 | Disposition: A | Discharge status: Home / Self Care | Payer: Medicare Other | Attending: Internal Medicine | Admitting: Internal Medicine

## 2022-12-30 ENCOUNTER — Other Ambulatory Visit: Payer: Self-pay

## 2022-12-30 DIAGNOSIS — Z952 Presence of prosthetic heart valve: Secondary | ICD-10-CM | POA: Diagnosis not present

## 2022-12-30 DIAGNOSIS — M4854XA Collapsed vertebra, not elsewhere classified, thoracic region, initial encounter for fracture: Secondary | ICD-10-CM | POA: Diagnosis present

## 2022-12-30 DIAGNOSIS — Z888 Allergy status to other drugs, medicaments and biological substances status: Secondary | ICD-10-CM | POA: Diagnosis not present

## 2022-12-30 DIAGNOSIS — I482 Chronic atrial fibrillation, unspecified: Secondary | ICD-10-CM | POA: Diagnosis present

## 2022-12-30 DIAGNOSIS — K630 Abscess of intestine: Secondary | ICD-10-CM | POA: Diagnosis present

## 2022-12-30 DIAGNOSIS — Z8249 Family history of ischemic heart disease and other diseases of the circulatory system: Secondary | ICD-10-CM

## 2022-12-30 DIAGNOSIS — Z79899 Other long term (current) drug therapy: Secondary | ICD-10-CM

## 2022-12-30 DIAGNOSIS — Z7901 Long term (current) use of anticoagulants: Secondary | ICD-10-CM

## 2022-12-30 DIAGNOSIS — M4856XA Collapsed vertebra, not elsewhere classified, lumbar region, initial encounter for fracture: Secondary | ICD-10-CM | POA: Diagnosis present

## 2022-12-30 DIAGNOSIS — I340 Nonrheumatic mitral (valve) insufficiency: Secondary | ICD-10-CM | POA: Diagnosis present

## 2022-12-30 DIAGNOSIS — K831 Obstruction of bile duct: Secondary | ICD-10-CM | POA: Diagnosis present

## 2022-12-30 DIAGNOSIS — R7401 Elevation of levels of liver transaminase levels: Secondary | ICD-10-CM | POA: Diagnosis not present

## 2022-12-30 DIAGNOSIS — R569 Unspecified convulsions: Secondary | ICD-10-CM

## 2022-12-30 DIAGNOSIS — I10 Essential (primary) hypertension: Secondary | ICD-10-CM | POA: Diagnosis not present

## 2022-12-30 DIAGNOSIS — R7989 Other specified abnormal findings of blood chemistry: Secondary | ICD-10-CM

## 2022-12-30 DIAGNOSIS — I4892 Unspecified atrial flutter: Secondary | ICD-10-CM | POA: Diagnosis present

## 2022-12-30 DIAGNOSIS — K810 Acute cholecystitis: Principal | ICD-10-CM | POA: Diagnosis present

## 2022-12-30 DIAGNOSIS — G40909 Epilepsy, unspecified, not intractable, without status epilepticus: Secondary | ICD-10-CM | POA: Diagnosis present

## 2022-12-30 DIAGNOSIS — I491 Atrial premature depolarization: Secondary | ICD-10-CM | POA: Diagnosis present

## 2022-12-30 DIAGNOSIS — K838 Other specified diseases of biliary tract: Secondary | ICD-10-CM

## 2022-12-30 LAB — COMPREHENSIVE METABOLIC PANEL
ALT: 90 U/L — ABNORMAL HIGH (ref 0–44)
AST: 87 U/L — ABNORMAL HIGH (ref 15–41)
Albumin: 3.5 g/dL (ref 3.5–5.0)
Alkaline Phosphatase: 511 U/L — ABNORMAL HIGH (ref 38–126)
Anion gap: 9 (ref 5–15)
BUN: 29 mg/dL — ABNORMAL HIGH (ref 8–23)
CO2: 26 mmol/L (ref 22–32)
Calcium: 9.3 mg/dL (ref 8.9–10.3)
Chloride: 99 mmol/L (ref 98–111)
Creatinine, Ser: 0.87 mg/dL (ref 0.61–1.24)
GFR, Estimated: >60 mL/min (ref >60)
Glucose, Bld: 131 mg/dL — ABNORMAL HIGH (ref 70–99)
Potassium: 4.3 mmol/L (ref 3.5–5.1)
Sodium: 134 mmol/L — ABNORMAL LOW (ref 135–145)
Total Bilirubin: 0.8 mg/dL (ref 0.3–1.2)
Total Protein: 6.5 g/dL (ref 6.5–8.1)

## 2022-12-30 LAB — URINALYSIS, ROUTINE W REFLEX MICROSCOPIC
Bacteria, UA: NONE SEEN
Bilirubin Urine: NEGATIVE
Glucose, UA: NEGATIVE mg/dL
Hgb urine dipstick: NEGATIVE
Leukocytes,Ua: NEGATIVE
Nitrite: NEGATIVE
Protein, ur: 30 mg/dL — AB
Specific Gravity, Urine: 1.042 — ABNORMAL HIGH (ref 1.005–1.030)
pH: 6 (ref 5.0–8.0)

## 2022-12-30 LAB — CBC
HCT: 36.6 % — ABNORMAL LOW (ref 39.0–52.0)
Hemoglobin: 12.2 g/dL — ABNORMAL LOW (ref 13.0–17.0)
MCH: 33 pg (ref 26.0–34.0)
MCHC: 33.3 g/dL (ref 30.0–36.0)
MCV: 98.9 fL (ref 80.0–100.0)
Platelets: 290 10*3/uL (ref 150–400)
RBC: 3.7 MIL/uL — ABNORMAL LOW (ref 4.22–5.81)
RDW: 13.4 % (ref 11.5–15.5)
WBC: 5.9 10*3/uL (ref 4.0–10.5)
nRBC: 0 % (ref 0.0–0.2)

## 2022-12-30 LAB — HEPARIN LEVEL (UNFRACTIONATED): Heparin Unfractionated: 0.96 [IU]/mL — ABNORMAL HIGH (ref 0.30–0.70)

## 2022-12-30 LAB — LIPASE, BLOOD: Lipase: 49 U/L (ref 11–51)

## 2022-12-30 LAB — APTT: aPTT: 36 s (ref 24–36)

## 2022-12-30 LAB — PROTIME-INR
INR: 1.3 — ABNORMAL HIGH (ref 0.8–1.2)
Prothrombin Time: 16.7 s — ABNORMAL HIGH (ref 11.4–15.2)

## 2022-12-30 MED ORDER — ACETAMINOPHEN 325 MG PO TABS: 650.0000 mg | ORAL_TABLET | Freq: Four times a day (QID) | ORAL | Status: DC | PRN

## 2022-12-30 MED ORDER — SODIUM CHLORIDE 0.9% FLUSH
3.0000 mL | INTRAVENOUS | Status: DC | PRN
  Administered 2023-01-01: 3 mL via INTRAVENOUS

## 2022-12-30 MED ORDER — ACETAMINOPHEN 650 MG RE SUPP: 650.0000 mg | Freq: Four times a day (QID) | RECTAL | Status: DC | PRN

## 2022-12-30 MED ORDER — PIPERACILLIN-TAZOBACTAM 3.375 G IVPB
3.3750 g | Freq: Three times a day (TID) | INTRAVENOUS | Status: DC
  Administered 2022-12-30 – 2023-01-03 (×11): 3.375 g via INTRAVENOUS
  Filled 2022-12-30 (×11): qty 50

## 2022-12-30 MED ORDER — SENNOSIDES-DOCUSATE SODIUM 8.6-50 MG PO TABS: 1.0000 | ORAL_TABLET | Freq: Every evening | ORAL | Status: DC | PRN

## 2022-12-30 MED ORDER — PIPERACILLIN-TAZOBACTAM 3.375 G IVPB: 3.3750 g | Freq: Three times a day (TID) | INTRAVENOUS | Status: DC

## 2022-12-30 MED ORDER — MORPHINE SULFATE (PF) 2 MG/ML IV SOLN: 1.0000 mg | INTRAVENOUS | Status: DC | PRN

## 2022-12-30 MED ORDER — METOPROLOL SUCCINATE ER 25 MG PO TB24
25.0000 mg | ORAL_TABLET | Freq: Every day | ORAL | Status: DC
  Administered 2022-12-30 – 2023-01-03 (×4): 25 mg via ORAL
  Filled 2022-12-30 (×5): qty 1

## 2022-12-30 MED ORDER — PIPERACILLIN-TAZOBACTAM 3.375 G IVPB 30 MIN
3.3750 g | Freq: Once | INTRAVENOUS | Status: AC
  Administered 2022-12-30: 3.375 g via INTRAVENOUS
  Filled 2022-12-30: qty 50

## 2022-12-30 MED ORDER — DEXTROSE IN LACTATED RINGERS 5 % IV SOLN: INTRAVENOUS | Status: AC

## 2022-12-30 MED ORDER — MORPHINE SULFATE (PF) 4 MG/ML IV SOLN
4.0000 mg | Freq: Once | INTRAVENOUS | Status: AC
  Administered 2022-12-30: 4 mg via INTRAVENOUS
  Filled 2022-12-30: qty 1

## 2022-12-30 MED ORDER — SODIUM CHLORIDE 0.9 % IV SOLN: 250.0000 mL | INTRAVENOUS | Status: AC | PRN

## 2022-12-30 MED ORDER — DIVALPROEX SODIUM 250 MG PO DR TAB
750.0000 mg | DELAYED_RELEASE_TABLET | Freq: Two times a day (BID) | ORAL | Status: DC
  Administered 2022-12-30 – 2023-01-03 (×8): 750 mg via ORAL
  Filled 2022-12-30 (×8): qty 3

## 2022-12-30 MED ORDER — SODIUM CHLORIDE 0.9% FLUSH
3.0000 mL | Freq: Two times a day (BID) | INTRAVENOUS | Status: DC
Start: 2022-12-30 — End: 2023-01-03
  Administered 2022-12-30 – 2023-01-03 (×7): 3 mL via INTRAVENOUS

## 2022-12-30 MED ORDER — METOPROLOL SUCCINATE ER 25 MG PO TB24: 25.0000 mg | ORAL_TABLET | Freq: Every day | ORAL | Status: DC

## 2022-12-30 MED ORDER — HEPARIN (PORCINE) 25000 UT/250ML-% IV SOLN
1600.0000 [IU]/h | INTRAVENOUS | Status: DC
  Administered 2022-12-30: 1100 [IU]/h via INTRAVENOUS
  Administered 2023-01-01: 1450 [IU]/h via INTRAVENOUS
  Filled 2022-12-30 (×3): qty 250

## 2022-12-30 MED ORDER — IBUPROFEN 400 MG PO TABS
200.0000 mg | ORAL_TABLET | ORAL | Status: DC | PRN
  Administered 2022-12-30: 200 mg via ORAL
  Filled 2022-12-30: qty 1

## 2022-12-30 MED ORDER — ONDANSETRON HCL 4 MG/2ML IJ SOLN: 4.0000 mg | Freq: Four times a day (QID) | INTRAMUSCULAR | Status: DC | PRN

## 2022-12-30 MED ORDER — LACTATED RINGERS IV BOLUS
1000.0000 mL | Freq: Once | INTRAVENOUS | Status: AC
Start: 2022-12-30 — End: 2022-12-30
  Administered 2022-12-30: 1000 mL via INTRAVENOUS

## 2022-12-30 MED ORDER — ONDANSETRON HCL 4 MG PO TABS: 4.0000 mg | ORAL_TABLET | Freq: Four times a day (QID) | ORAL | Status: DC | PRN

## 2022-12-30 MED ORDER — HYDROCODONE-ACETAMINOPHEN 5-325 MG PO TABS: 1.0000 | ORAL_TABLET | ORAL | Status: DC | PRN

## 2022-12-30 MED ORDER — GABAPENTIN 300 MG PO CAPS
300.0000 mg | ORAL_CAPSULE | Freq: Every day | ORAL | Status: DC
  Administered 2022-12-30 – 2023-01-02 (×4): 300 mg via ORAL
  Filled 2022-12-30 (×4): qty 1

## 2022-12-30 MED ORDER — HYDRALAZINE HCL 20 MG/ML IJ SOLN: 5.0000 mg | Freq: Four times a day (QID) | INTRAMUSCULAR | Status: DC | PRN

## 2022-12-30 MED ORDER — AMLODIPINE BESYLATE 5 MG PO TABS
5.0000 mg | ORAL_TABLET | Freq: Every day | ORAL | Status: DC
  Administered 2022-12-31 – 2023-01-03 (×3): 5 mg via ORAL
  Filled 2022-12-30 (×4): qty 1

## 2022-12-30 MED ORDER — DIVALPROEX SODIUM 250 MG PO DR TAB: 250.0000 mg | DELAYED_RELEASE_TABLET | Freq: Two times a day (BID) | ORAL | Status: DC

## 2022-12-30 NOTE — ED Notes (Signed)
ED TO INPATIENT HANDOFF REPORT  ED Nurse Name and Phone #:   S Name/Age/Gender Samuel Mayo 77 y.o. male Room/Bed: DB002/DB002  Code Status   Code Status: Not on file  Home/SNF/Other Home Patient oriented to: self, place, time, and situation Is this baseline? Yes   Triage Complete: Triage complete  Chief Complaint Acute cholecystitis [K81.0]  Triage Note Pt states he is having "gall bladder " pain. Worse for the last week. Denies N/V States he is having chills.    Allergies Allergies  Allergen Reactions   Other Other (See Comments)    Neosporin Ophthalmic causes eyes to turn red    Level of Care/Admitting Diagnosis ED Disposition     ED Disposition  Admit   Condition  --   Comment  Hospital Area: [facility] [[id number]]  Level of Care: Telemetry Medical [104]  May admit patient to [facility] or [facility] if equivalent level of care is available:: No  Interfacility transfer: Yes  Covid Evaluation: Asymptomatic - no recent exposure (last 10 days) testing not required  Diagnosis: Acute cholecystitis [575.0.ICD-9-CM]  Admitting Physician: Rodolph Bong [[id number]]  Attending Physician: Arletha Pili [[id number]]  Certification:: I certify this patient will need inpatient services for at least 2 midnights  Expected Medical Readiness: 01/04/2023          B Medical/Surgery History Past Medical History:  Diagnosis Date   AK (actinic keratosis)    Allergy    Chicken pox    Compression fracture of L1 lumbar vertebra (HCC)    Compression fracture of thoracic vertebra (HCC)    Hypertension    Lentigines    Follows with DR Culton (derma)   Seizures (HCC)    Past Surgical History:  Procedure Laterality Date   CARDIOVERSION N/A 05/14/2021   Procedure: CARDIOVERSION;  Surgeon: Little Ishikawa, MD;  Location: [facility] ENDOSCOPY;  Service: Cardiovascular;  Laterality: N/A;   CARDIOVERSION N/A 10/08/2021   Procedure: CARDIOVERSION;   Surgeon: Christell Constant, MD;  Location: [facility] ENDOSCOPY;  Service: Cardiovascular;  Laterality: N/A;   COLONOSCOPY     mitral vavle     Congetinal mitral valve disease.     A IV Location/Drains/Wounds Patient Lines/Drains/Airways Status     Active Line/Drains/Airways     Name Placement date Placement time Site Days   Peripheral IV 12/30/22 20 G 1" Right Antecubital 12/30/22  1603  Antecubital  less than 1            Intake/Output Last 24 hours No intake or output data in the 24 hours ending 12/30/22 1716  Labs/Imaging Results for orders placed or performed during the hospital encounter of 12/30/22 (from the past 48 hour(s))  Lipase, blood     Status: None   Collection Time: 12/30/22  4:00 PM  Result Value Ref Range   Lipase 49 11 - 51 U/L    Comment: Performed at [facility], [address] [place], [place] [address]  Comprehensive metabolic panel     Status: Abnormal   Collection Time: 12/30/22  4:00 PM  Result Value Ref Range   Sodium 134 (L) 135 - 145 mmol/L   Potassium 4.3 3.5 - 5.1 mmol/L   Chloride 99 98 - 111 mmol/L   CO2 26 22 - 32 mmol/L   Glucose, Bld 131 (H) 70 - 99 mg/dL    Comment: Glucose reference range applies only to samples taken after fasting for at least 8 hours.   BUN 29 (H)  8 - 23 mg/dL   Creatinine, Ser 4.13 0.61 - 1.24 mg/dL   Calcium 9.3 8.9 - 24.4 mg/dL   Total Protein 6.5 6.5 - 8.1 g/dL   Albumin 3.5 3.5 - 5.0 g/dL   AST 87 (H) 15 - 41 U/L   ALT 90 (H) 0 - 44 U/L   Alkaline Phosphatase 511 (H) 38 - 126 U/L   Total Bilirubin 0.8 0.3 - 1.2 mg/dL   GFR, Estimated >01 >02 mL/min    Comment: (NOTE) Calculated using the CKD-EPI Creatinine Equation (2021)    Anion gap 9 5 - 15    Comment: Performed at [facility], [address], [place], [place] [address]  CBC     Status: Abnormal   Collection Time: 12/30/22  4:00 PM  Result Value Ref Range   WBC 5.9 4.0 - 10.5 K/uL   RBC 3.70 (L) 4.22 -  5.81 MIL/uL   Hemoglobin 12.2 (L) 13.0 - 17.0 g/dL   HCT 64.4 (L) 03.4 - 74.2 %   MCV 98.9 80.0 - 100.0 fL   MCH 33.0 26.0 - 34.0 pg   MCHC 33.3 30.0 - 36.0 g/dL   RDW 59.5 63.8 - 75.6 %   Platelets 290 150 - 400 K/uL   nRBC 0.0 0.0 - 0.2 %    Comment: Performed at [facility], [address] [place], [place] [address]   MR Abdomen W Wo Contrast  Result Date: 12/30/2022 CLINICAL DATA:  Jaundice. Abnormal gallbladder and borderline biliary ductal dilatation on recent ultrasound. EXAM: MRI ABDOMEN WITHOUT AND WITH CONTRAST TECHNIQUE: Multiplanar multisequence MR imaging of the abdomen was performed both before and after the administration of intravenous contrast. CONTRAST:  9mL GADAVIST GADOBUTROL 1 MMOL/ML IV SOLN COMPARISON:  Ultrasound on 12/26/2022 FINDINGS: Lower chest: Mild bibasilar atelectasis and tiny right pleural effusion. Hepatobiliary: Tiny sub-cm cyst noted in segment 4 B of the left lobe. No evidence of hepatic mass. The gallbladder is distended and shows diffuse wall thickening and pericholecystic edema, consistent with acute cholecystitis. A small rim enhancing fluid collection with surrounding edema is seen adjacent to the gallbladder fundus and involving the inferior left hepatic lobe. This measures 3.7 x 1.7 cm on image 49/11, consistent with small pericholecystic abscess. Mild dilatation of the intrahepatic bile ducts is seen due to mild stricture of the common hepatic duct which appears to be secondary to adjacent cholecystitis. Distal common bile duct is nondilated and there is no evidence of choledocholithiasis. Pancreas:  No mass or inflammatory changes. Spleen:  Within normal limits in size and appearance. Adrenals/Urinary Tract: No suspicious masses identified. No evidence of hydronephrosis. Stomach/Bowel: Unremarkable. Vascular/Lymphatic: No pathologically enlarged lymph nodes identified. No acute vascular findings. Other:  None. Musculoskeletal:  No  suspicious bone lesions identified. IMPRESSION: Acute cholecystitis, with small pericholecystic abscess adjacent to the gallbladder fundus and involving the inferior left hepatic lobe. Mild diffuse intrahepatic biliary ductal dilatation due to stricture of the common hepatic duct, which appears to be secondary to adjacent acute cholecystitis. This is consistent with Mirizzi syndrome. No evidence of choledocholithiasis. Mild bibasilar atelectasis and tiny right pleural effusion. These results will be called to the ordering clinician or representative by the Radiologist Assistant, and communication documented in the PACS or [employer]. Electronically Signed   By: Danae Orleans M.D.   On: 12/30/2022 14:46    Pending Labs Unresulted Labs (From admission, onward)     Start     Ordered   12/30/22 1550  Urinalysis,  Routine w reflex microscopic -Urine, Clean Catch  Once,   URGENT       Question Answer Comment  Specimen Source Urine, Clean Catch   Release to patient Immediate      12/30/22 1549            Vitals/Pain Today's Vitals   12/30/22 1545 12/30/22 1546 12/30/22 1711  BP: (!) 142/94    Pulse: (!) 110    Resp: 20    Temp: 98.5 F (36.9 C)    TempSrc: Oral    SpO2: 96%    Weight:  195 lb (88.5 kg)   Height:  5\' 11"  (1.803 m)   PainSc:  6  4     Isolation Precautions No active isolations  Medications Medications  piperacillin-tazobactam (ZOSYN) IVPB 3.375 g (3.375 g Intravenous New Bag/Given 12/30/22 1628)  morphine (PF) 4 MG/ML injection 4 mg (4 mg Intravenous Given 12/30/22 1629)  lactated ringers bolus 1,000 mL (1,000 mLs Intravenous New Bag/Given 12/30/22 1626)    Mobility walks     Focused Assessments    R Recommendations: See Admitting Provider Note  Report given to:   Additional Notes:

## 2022-12-30 NOTE — Consult Note (Signed)
Rose Overbaugh 27-Sep-1945  [id number].    Requesting MD: Janalyn Shy Chief Complaint/Reason for Consult: Acute cholecystitis  HPI:  77 y/o M w/ a hx of MVR w/ annuloplasty, Afib on Eliquis (last dose 10/28), HTN, and a seizure disorder who presents with 1 month of abdominal pain and has imaging c/f acute cholecystitis w/ possible Mirizzi syndrome.    He reports that he developed a vague RUQ abdominal pain that failed to resolve.  He experienced general malaise but denies fevers, nausea, emesis, or diarrhea.  He was seen by his PCP 10/24 and had an [place] c/f acute cholecystitis.  His pain persisted and he ultimately presented to the ED.  MRCP was performed and showed cholecystitis w/ a small abscess adjacent to the fundus and hepatic ductal dilation.  He is AF and HDS.  ALT 90, AST 87, Tbili 0.8.  ROS: Review of Systems  Constitutional:  Positive for malaise/fatigue.  HENT: Negative.    Eyes: Negative.   Respiratory: Negative.    Cardiovascular: Negative.   Gastrointestinal:  Positive for abdominal pain.  Genitourinary: Negative.   Musculoskeletal: Negative.   Skin: Negative.   Neurological: Negative.   Endo/Heme/Allergies: Negative.   Psychiatric/Behavioral: Negative.      Family History  Problem Relation Age of Onset   Heart disease Mother    Cancer Father    Heart disease Father    Prostate cancer Father    Aneurysm Paternal Grandfather    Heart disease Son        congenital mitral valve dz   Colon cancer Neg Hx    Esophageal cancer Neg Hx    Stomach cancer Neg Hx    Rectal cancer Neg Hx     Past Medical History:  Diagnosis Date   AK (actinic keratosis)    Allergy    Chicken pox    Compression fracture of L1 lumbar vertebra (HCC)    Compression fracture of thoracic vertebra (HCC)    Hypertension    Lentigines    Follows with DR Culton (derma)   Seizures (HCC)     Past Surgical History:  Procedure Laterality Date   CARDIOVERSION N/A 05/14/2021   Procedure:  CARDIOVERSION;  Surgeon: Little Ishikawa, MD;  [facility]: [facility] ENDOSCOPY;  Service: Cardiovascular;  Laterality: N/A;   CARDIOVERSION N/A 10/08/2021   Procedure: CARDIOVERSION;  Surgeon: Christell Constant, MD;  Location: [facility] ENDOSCOPY;  Service: Cardiovascular;  Laterality: N/A;   COLONOSCOPY     mitral vavle     Congetinal mitral valve disease.    Social History:  reports that he has never smoked. He has never used smokeless tobacco. He reports current alcohol use of about 14.0 standard drinks of alcohol per week. He reports that he does not use drugs.  Allergies:  Allergies  Allergen Reactions   Other Other (See Comments)    Neosporin Ophthalmic causes eyes to turn red    Medications Prior to Admission  Medication Sig Dispense Refill   amLODipine (NORVASC) 5 MG tablet TAKE 1 TABLET (5 MG TOTAL) BY MOUTH DAILY. 90 tablet 3   beta carotene w/minerals (OCUVITE) tablet Take 1 tablet by mouth every evening.     Cholecalciferol (VITAMIN D3) 50 MCG (2000 UT) TABS Take 2,000 tablets by mouth every evening.     divalproex (DEPAKOTE) 250 MG DR tablet Take 1 tablet (250 mg total) by mouth 2 (two) times daily. Take in addition to 500 mg twice a day (pt should have a total of 750  mg twice a day) 60 tablet 5   divalproex (DEPAKOTE) 500 MG DR tablet Take 1 tablet (500 mg total) by mouth 2 (two) times daily. 180 tablet 2   ELIQUIS 5 MG TABS tablet TAKE 1 TABLET BY MOUTH TWICE A DAY 60 tablet 5   gabapentin (NEURONTIN) 300 MG capsule Take 1 capsule (300 mg total) by mouth at bedtime. 90 capsule 1   Homeopathic Products (ARNICARE) GEL Apply 1 Application topically daily as needed (For pain). On Ankle     ibandronate (BONIVA) 150 MG tablet TAKE 1 TABLET BY MOUTH EVERY 30 DAYS. TAKE IN AM WITH FULL GLASS OF WATER ON EMPTY STOMACH AND DON'T TAKE ANYTHING ELSE BY MOUTH OR LIE DOWN FOR THE NEXT 30 MINUTES 9 tablet 3   metoprolol succinate (TOPROL-XL) 25 MG 24 hr tablet TAKE 1 TABLET (25 MG TOTAL) BY  MOUTH DAILY. 90 tablet 3   Multiple Vitamin (MULTIVITAMIN WITH MINERALS) TABS tablet Take 1 tablet by mouth 2 (two) times daily. Vitamin Code Whole Food Multivitamin      Physical Exam: Blood pressure (!) 137/95, pulse 87, temperature 98.3 F (36.8 C), temperature source Oral, resp. rate 17, height 5\' 11"  (1.803 m), weight 88.5 kg, SpO2 98%. Gen: male resting in bed, NAD Abd: soft, non-distended, mild TTP in the RUQ, no rebound/guarding, no peritoneal signs  Results for orders placed or performed during the hospital encounter of 12/30/22 (from the past 48 hour(s))  Lipase, blood     Status: None   Collection Time: 12/30/22  4:00 PM  Result Value Ref Range   Lipase 49 11 - 51 U/L    Comment: Performed at [facility], [address], [place], [place] [address]  Comprehensive metabolic panel     Status: Abnormal   Collection Time: 12/30/22  4:00 PM  Result Value Ref Range   Sodium 134 (L) 135 - 145 mmol/L   Potassium 4.3 3.5 - 5.1 mmol/L   Chloride 99 98 - 111 mmol/L   CO2 26 22 - 32 mmol/L   Glucose, Bld 131 (H) 70 - 99 mg/dL    Comment: Glucose reference range applies only to samples taken after fasting for at least 8 hours.   BUN 29 (H) 8 - 23 mg/dL   Creatinine, Ser 5.64 0.61 - 1.24 mg/dL   Calcium 9.3 8.9 - 33.2 mg/dL   Total Protein 6.5 6.5 - 8.1 g/dL   Albumin 3.5 3.5 - 5.0 g/dL   AST 87 (H) 15 - 41 U/L   ALT 90 (H) 0 - 44 U/L   Alkaline Phosphatase 511 (H) 38 - 126 U/L   Total Bilirubin 0.8 0.3 - 1.2 mg/dL   GFR, Estimated >95 >18 mL/min    Comment: (NOTE) Calculated using the CKD-EPI Creatinine Equation (2021)    Anion gap 9 5 - 15    Comment: Performed at [facility], [address], [place], [place] [address]  CBC     Status: Abnormal   Collection Time: 12/30/22  4:00 PM  Result Value Ref Range   WBC 5.9 4.0 - 10.5 K/uL   RBC 3.70 (L) 4.22 - 5.81 MIL/uL   Hemoglobin 12.2 (L) 13.0 - 17.0 g/dL   HCT 06.3 (L) 01.6 - 01.0 %    MCV 98.9 80.0 - 100.0 fL   MCH 33.0 26.0 - 34.0 pg   MCHC 33.3 30.0 - 36.0 g/dL   RDW 93.2 35.5 - 73.2 %   Platelets 290 150 - 400 K/uL  nRBC 0.0 0.0 - 0.2 %    Comment: Performed at [facility], [address], [place], [place] [address]  Urinalysis, Routine w reflex microscopic -Urine, Clean Catch     Status: Abnormal   Collection Time: 12/30/22  4:00 PM  Result Value Ref Range   Color, Urine YELLOW YELLOW   APPearance CLEAR CLEAR   Specific Gravity, Urine 1.042 (H) 1.005 - 1.030   pH 6.0 5.0 - 8.0   Glucose, UA NEGATIVE NEGATIVE mg/dL   Hgb urine dipstick NEGATIVE NEGATIVE   Bilirubin Urine NEGATIVE NEGATIVE   Ketones, ur TRACE (A) NEGATIVE mg/dL   Protein, ur 30 (A) NEGATIVE mg/dL   Nitrite NEGATIVE NEGATIVE   Leukocytes,Ua NEGATIVE NEGATIVE   RBC / HPF 0-5 0 - 5 RBC/hpf   WBC, UA 0-5 0 - 5 WBC/hpf   Bacteria, UA NONE SEEN NONE SEEN   Squamous Epithelial / HPF 0-5 0 - 5 /HPF   Mucus PRESENT    Hyaline Casts, UA PRESENT     Comment: Performed at [facility], [address], [place], [place] [address]  APTT     Status: None   Collection Time: 12/30/22  9:51 PM  Result Value Ref Range   aPTT 36 24 - 36 seconds    Comment: Performed at [facility], [address], [place], [place] [address]  Protime-INR     Status: Abnormal   Collection Time: 12/30/22  9:51 PM  Result Value Ref Range   Prothrombin Time 16.7 (H) 11.4 - 15.2 seconds   INR 1.3 (H) 0.8 - 1.2    Comment: (NOTE) INR goal varies based on device and disease states. Performed at [facility], [address] [place], [place] [address]   Heparin level (unfractionated)     Status: Abnormal   Collection Time: 12/30/22  9:51 PM  Result Value Ref Range   Heparin Unfractionated 0.96 (H) 0.30 - 0.70 IU/mL    Comment: (NOTE) The clinical reportable range upper limit is being lowered to >1.10 to align with the FDA approved guidance for the current  laboratory assay.  If heparin results are below expected values, and patient dosage has  been confirmed, suggest follow up testing of antithrombin III levels. Performed at [facility], [address], [place], [place] [address]    MR Abdomen W Wo Contrast  Result Date: 12/30/2022 CLINICAL DATA:  Jaundice. Abnormal gallbladder and borderline biliary ductal dilatation on recent ultrasound. EXAM: MRI ABDOMEN WITHOUT AND WITH CONTRAST TECHNIQUE: Multiplanar multisequence MR imaging of the abdomen was performed both before and after the administration of intravenous contrast. CONTRAST:  9mL GADAVIST GADOBUTROL 1 MMOL/ML IV SOLN COMPARISON:  Ultrasound on 12/26/2022 FINDINGS: Lower chest: Mild bibasilar atelectasis and tiny right pleural effusion. Hepatobiliary: Tiny sub-cm cyst noted in segment 4 B of the left lobe. No evidence of hepatic mass. The gallbladder is distended and shows diffuse wall thickening and pericholecystic edema, consistent with acute cholecystitis. A small rim enhancing fluid collection with surrounding edema is seen adjacent to the gallbladder fundus and involving the inferior left hepatic lobe. This measures 3.7 x 1.7 cm on image 49/11, consistent with small pericholecystic abscess. Mild dilatation of the intrahepatic bile ducts is seen due to mild stricture of the common hepatic duct which appears to be secondary to adjacent cholecystitis. Distal common bile duct is nondilated and there is no evidence of choledocholithiasis. Pancreas:  No mass or inflammatory changes. Spleen:  Within normal limits  in size and appearance. Adrenals/Urinary Tract: No suspicious masses identified. No evidence of hydronephrosis. Stomach/Bowel: Unremarkable. Vascular/Lymphatic: No pathologically enlarged lymph nodes identified. No acute vascular findings. Other:  None. Musculoskeletal:  No suspicious bone lesions identified. IMPRESSION: Acute cholecystitis, with small pericholecystic abscess adjacent  to the gallbladder fundus and involving the inferior left hepatic lobe. Mild diffuse intrahepatic biliary ductal dilatation due to stricture of the common hepatic duct, which appears to be secondary to adjacent acute cholecystitis. This is consistent with Mirizzi syndrome. No evidence of choledocholithiasis. Mild bibasilar atelectasis and tiny right pleural effusion. These results will be called to the ordering clinician or representative by the Radiologist Assistant, and communication documented in the PACS or [employer]. Electronically Signed   By: Danae Orleans M.D.   On: 12/30/2022 14:46    Assessment/Plan 77 y/o M w/ a hx of MVR w/ annuloplasty, Afib on Eliquis (last dose 10/28), HTN, and a seizure disorder who presents with 1 month of abdominal pain imaging c/w acute cholecystitis w/ an adjacent abscess and possible Mirizzi syndrome  - No indication for urgent surgical intervention - Admit to medicine - Trend LFTs - IV abx - NPO at MN - Hold Eliquis - Will discuss plan for possible intervention in the morning.   I reviewed last 24 h vitals and pain scores, last 24 h labs and trends, and last 24 h imaging results.  T[facility] 12/30/2022, 11:46 PM Please see Amion for pager number during day hours 7:00am-4:30pm or 7:00am -11:30am on weekends

## 2022-12-30 NOTE — ED Notes (Signed)
[facility] called for pt bed ready assignment, will have transport soon

## 2022-12-30 NOTE — H&P (Addendum)
History and Physical    Samuel Mayo ZOX:[id number] DOB: [birth year] DOA: 12/30/2022  PCP: [name], [name], MD   Patient coming from: Home   Chief Complaint:  Chief Complaint  Patient presents with   Abdominal Pain   ED TRIAGE note:  HPI:  Patient is a pleasant 77 year old gentleman history of mitral regurgitation status post mitral valve annuloplasty, atypical atrial flutter and A-fib on chronic anticoagulation with Eliquis, hypertension and seizure disorder who presented to the ED with a 3-week history of right upper quadrant abdominal pain, anorexia with nausea significant improvement.  Patient noted to have been seen by PCP for jaundice abdominal ultrasound done 12/26/2022 concerning for acute cholecystitis and common bile duct dilatation.  Patient underwent MRI per PCP and while awaiting results due to ongoing abdominal pain presented to the ED via drawbridge.   EDP reviewed MRI abdomen findings which were consistent with acute cholecystitis with small pericholecystic abscess adjacent to the gallbladder fundus and involving the inferior left hepatic lobe.  Mild diffuse intrahepatic biliary ductal dilatation due to stricture of the common hepatic duct which appears to be secondary to hydration acute cholecystitis noted. Patient noted to be afebrile, tachycardia with a heart rate of 110.  Comprehensive metabolic profile with a sodium of 134, glucose of 131, BUN 29, phosphatase 511, AST 87, ALT 90 otherwise within normal limits.  CBC with a hemoglobin of 12.2 otherwise within normal limits.  Patient given IV fluids, IV Zosyn.  EDP discussed with general surgery at [facility], who recommended medical admission and general surgery will consult on patient to determine drain placement versus operative management and surgical team will coordinate with IR.    Review of Systems:  Review of Systems  Constitutional:  Negative for chills, fever, malaise/fatigue and weight loss.  Respiratory:   Negative for cough, sputum production and shortness of breath.   Cardiovascular:  Negative for chest pain, palpitations and leg swelling.  Gastrointestinal:  Negative for abdominal pain, constipation, diarrhea, heartburn, nausea and vomiting.  Musculoskeletal:  Negative for myalgias and neck pain.  Neurological:  Negative for dizziness and headaches.  Psychiatric/Behavioral:  The patient is not nervous/anxious.     Past Medical History:  Diagnosis Date   AK (actinic keratosis)    Allergy    Chicken pox    Compression fracture of L1 lumbar vertebra (HCC)    Compression fracture of thoracic vertebra (HCC)    Hypertension    Lentigines    Follows with DR Culton (derma)   Seizures (HCC)     Past Surgical History:  Procedure Laterality Date   CARDIOVERSION N/A 05/14/2021   Procedure: CARDIOVERSION;  Surgeon: Little Ishikawa, MD;  Location: [facility] ENDOSCOPY;  Service: Cardiovascular;  Laterality: N/A;   CARDIOVERSION N/A 10/08/2021   Procedure: CARDIOVERSION;  Surgeon: Christell Constant, MD;  Location: [facility] ENDOSCOPY;  Service: Cardiovascular;  Laterality: N/A;   COLONOSCOPY     mitral vavle     Congetinal mitral valve disease.     reports that he has never smoked. He has never used smokeless tobacco. He reports current alcohol use of about 14.0 standard drinks of alcohol per week. He reports that he does not use drugs.  Allergies  Allergen Reactions   Other Other (See Comments)    Neosporin Ophthalmic causes eyes to turn red    Family History  Problem Relation Age of Onset   Heart disease Mother    Cancer Father    Heart disease Father    Prostate cancer  Father    Aneurysm Paternal Grandfather    Heart disease Son        congenital mitral valve dz   Colon cancer Neg Hx    Esophageal cancer Neg Hx    Stomach cancer Neg Hx    Rectal cancer Neg Hx     Prior to Admission medications   Medication Sig Start Date End Date Taking? Authorizing Provider  amLODipine  (NORVASC) 5 MG tablet TAKE 1 TABLET (5 MG TOTAL) BY MOUTH DAILY. 01/11/22   [name], [name], MD  beta carotene w/minerals (OCUVITE) tablet Take 1 tablet by mouth daily.    [provider]  Cholecalciferol (VITAMIN D3) 50 MCG (2000 UT) TABS Take 2,000 tablets by mouth every evening.    [provider]  divalproex (DEPAKOTE) 250 MG DR tablet Take 1 tablet (250 mg total) by mouth 2 (two) times daily. Take in addition to 500 mg twice a day (pt should have a total of 750 mg twice a day) 08/26/22   Micki Riley, MD  divalproex (DEPAKOTE) 500 MG DR tablet Take 1 tablet (500 mg total) by mouth 2 (two) times daily. 05/20/22   Micki Riley, MD  ELIQUIS 5 MG TABS tablet TAKE 1 TABLET BY MOUTH TWICE A DAY 08/07/22   Mealor, Roberts Gaudy, MD  gabapentin (NEURONTIN) 300 MG capsule Take 1 capsule (300 mg total) by mouth at bedtime. 09/16/22   Micki Riley, MD  Homeopathic Products (ARNICARE) GEL Apply 1 Application topically 3 (three) times a week. On Ankle    [provider]  ibandronate (BONIVA) 150 MG tablet TAKE 1 TABLET BY MOUTH EVERY 30 DAYS. TAKE IN AM WITH FULL GLASS OF WATER ON EMPTY STOMACH AND DON'T TAKE ANYTHING ELSE BY MOUTH OR LIE DOWN FOR THE NEXT 30 MINUTES 07/22/22   [name], [name], MD  metoprolol succinate (TOPROL-XL) 25 MG 24 hr tablet TAKE 1 TABLET (25 MG TOTAL) BY MOUTH DAILY. 09/23/22   [name], MD  Multiple Vitamin (CALCIUM COMPLEX PO) Take 1 tablet by mouth 2 (two) times daily. Bone Up [place] Formulas    [provider]  Multiple Vitamin (MULTIVITAMIN WITH MINERALS) TABS tablet Take 1 tablet by mouth 2 (two) times daily. Vitamin Code Whole Food Multivitamin    [provider]     Physical Exam: Vitals:   12/30/22 1546 12/30/22 1715 12/30/22 1745 12/30/22 1927  BP:  122/73 120/78 (!) 137/95  Pulse:  88 87 87  Resp:  (!) 22 19 17   Temp:    98.3 F (36.8 C)  TempSrc:    Oral  SpO2:  95% 92% 98%  Weight: 88.5 kg     Height: 5\' 11"   (1.803 m)       Physical Exam Constitutional:      General: He is not in acute distress.    Appearance: He is not ill-appearing.  HENT:     Mouth/Throat:     Pharynx: Oropharynx is clear.  Cardiovascular:     Rate and Rhythm: Normal rate and regular rhythm.  Pulmonary:     Effort: Pulmonary effort is normal.     Breath sounds: Normal breath sounds.  Abdominal:     General: Bowel sounds are normal.     Palpations: Abdomen is soft. There is no shifting dullness, hepatomegaly or mass.     Tenderness: There is no abdominal tenderness. There is no guarding or rebound. Negative signs include Murphy's sign.  Skin:    Coloration:  Skin is not jaundiced.     Findings: No erythema or rash.  Neurological:     Mental Status: He is oriented to person, place, and time.  Psychiatric:        Mood and Affect: Mood normal. Mood is not anxious.        Behavior: Behavior normal.      Labs on Admission: I have personally reviewed following labs and imaging studies  CBC: Recent Labs  Lab 12/27/22 0946 12/30/22 1600  WBC 11.5* 5.9  NEUTROABS 7.8*  --   HGB 12.7* 12.2*  HCT 39.3 36.6*  MCV 101.3* 98.9  PLT 296.0 290   Basic Metabolic Panel: Recent Labs  Lab 12/27/22 0946 12/30/22 1600  NA 132* 134*  K 4.4 4.3  CL 95* 99  CO2 29 26  GLUCOSE 116* 131*  BUN 31* 29*  CREATININE 1.08 0.87  CALCIUM 9.0 9.3   GFR: Estimated Creatinine Clearance: 74.5 mL/min (by C-G formula based on SCr of 0.87 mg/dL). Liver Function Tests: Recent Labs  Lab 12/27/22 0946 12/30/22 1600  AST 28 87*  ALT 50 90*  ALKPHOS 362* 511*  BILITOT 1.0 0.8  PROT 6.8 6.5  ALBUMIN 3.5 3.5   Recent Labs  Lab 12/30/22 1600  LIPASE 49   No results for input(s): "AMMONIA" in the last 168 hours. Coagulation Profile: No results for input(s): "INR", "PROTIME" in the last 168 hours. Cardiac Enzymes: No results for input(s): "CKTOTAL", "CKMB", "CKMBINDEX", "TROPONINI", "TROPONINIHS" in the last 168  hours. BNP (last 3 results) No results for input(s): "BNP" in the last 8760 hours. HbA1C: No results for input(s): "HGBA1C" in the last 72 hours. CBG: No results for input(s): "GLUCAP" in the last 168 hours. Lipid Profile: No results for input(s): "CHOL", "HDL", "LDLCALC", "TRIG", "CHOLHDL", "LDLDIRECT" in the last 72 hours. Thyroid Function Tests: No results for input(s): "TSH", "T4TOTAL", "FREET4", "T3FREE", "THYROIDAB" in the last 72 hours. Anemia Panel: No results for input(s): "VITAMINB12", "FOLATE", "FERRITIN", "TIBC", "IRON", "RETICCTPCT" in the last 72 hours. Urine analysis:    Component Value Date/Time   COLORURINE YELLOW 12/30/2022 1600   APPEARANCEUR CLEAR 12/30/2022 1600   APPEARANCEUR Hazy (A) 03/27/2020 1413   LABSPEC 1.042 (H) 12/30/2022 1600   LABSPEC 1.015 02/03/2012 1915   PHURINE 6.0 12/30/2022 1600   GLUCOSEU NEGATIVE 12/30/2022 1600   GLUCOSEU NEGATIVE 12/23/2022 1055   HGBUR NEGATIVE 12/30/2022 1600   BILIRUBINUR NEGATIVE 12/30/2022 1600   BILIRUBINUR Negative 03/27/2020 1413   BILIRUBINUR Negative 02/03/2012 1915   KETONESUR TRACE (A) 12/30/2022 1600   PROTEINUR 30 (A) 12/30/2022 1600   UROBILINOGEN 2.0 (A) 12/23/2022 1055   NITRITE NEGATIVE 12/30/2022 1600   LEUKOCYTESUR NEGATIVE 12/30/2022 1600   LEUKOCYTESUR Negative 02/03/2012 1915    Radiological Exams on Admission: I have personally reviewed images MR Abdomen W Wo Contrast  Result Date: 12/30/2022 CLINICAL DATA:  Jaundice. Abnormal gallbladder and borderline biliary ductal dilatation on recent ultrasound. EXAM: MRI ABDOMEN WITHOUT AND WITH CONTRAST TECHNIQUE: Multiplanar multisequence MR imaging of the abdomen was performed both before and after the administration of intravenous contrast. CONTRAST:  9mL GADAVIST GADOBUTROL 1 MMOL/ML IV SOLN COMPARISON:  Ultrasound on 12/26/2022 FINDINGS: Lower chest: Mild bibasilar atelectasis and tiny right pleural effusion. Hepatobiliary: Tiny sub-cm cyst noted  in segment 4 B of the left lobe. No evidence of hepatic mass. The gallbladder is distended and shows diffuse wall thickening and pericholecystic edema, consistent with acute cholecystitis. A small rim enhancing fluid collection with surrounding edema is seen  adjacent to the gallbladder fundus and involving the inferior left hepatic lobe. This measures 3.7 x 1.7 cm on image 49/11, consistent with small pericholecystic abscess. Mild dilatation of the intrahepatic bile ducts is seen due to mild stricture of the common hepatic duct which appears to be secondary to adjacent cholecystitis. Distal common bile duct is nondilated and there is no evidence of choledocholithiasis. Pancreas:  No mass or inflammatory changes. Spleen:  Within normal limits in size and appearance. Adrenals/Urinary Tract: No suspicious masses identified. No evidence of hydronephrosis. Stomach/Bowel: Unremarkable. Vascular/Lymphatic: No pathologically enlarged lymph nodes identified. No acute vascular findings. Other:  None. Musculoskeletal:  No suspicious bone lesions identified. IMPRESSION: Acute cholecystitis, with small pericholecystic abscess adjacent to the gallbladder fundus and involving the inferior left hepatic lobe. Mild diffuse intrahepatic biliary ductal dilatation due to stricture of the common hepatic duct, which appears to be secondary to adjacent acute cholecystitis. This is consistent with Mirizzi syndrome. No evidence of choledocholithiasis. Mild bibasilar atelectasis and tiny right pleural effusion. These results will be called to the ordering clinician or representative by the Radiologist Assistant, and communication documented in the PACS or [employer]. Electronically Signed   By: Danae Orleans M.D.   On: 12/30/2022 14:46    EKG: My personal interpretation of EKG shows: Sinus rhythm heart rate 88.  There is no ST and T wave abnormality    Assessment/Plan: Principal Problem:   Acute cholecystitis Active Problems:    Atrial fibrillation, chronic (HCC)   Hypertension   Seizure (HCC)    Assessment and Plan: Acute cholecystitis Diffuse intrahepatic biliary duct dilation Stricture of the common bile duct Transaminitis secondary to acute cholecystitis -Patient presenting to the ED for evaluation for abdominal pain, poor appetite and nausea.   -Ultrasound from 10/24 showed  Gallbladder is distended with wall thickening and trace pericholecystic fluid. Small amount of sludge in the gallbladder lumen. Findings are equivocal for acute cholecystitis. Common bile duct is dilated measuring 6 mm. Given the multitude of findings, and jaundice, recommend further evaluation with MRI/MRCP to assess for causative etiology and exclude possible underlying mass. -MRI of the abdomen 10/28 Acute cholecystitis, with small pericholecystic abscess adjacent to the gallbladder fundus and involving the inferior left hepatic lobe.  Mild diffuse intrahepatic biliary ductal dilatation due to stricture of the common hepatic duct, which appears to be secondary to adjacent acute cholecystitis. This is consistent with Mirizzi syndrome.  No evidence of choledocholithiasis. -At presentation to ED patient is hemodynamically stable.  Afebrile.  CBC no evidence of leukocytosis. - CMP unremarkable except elevated AST 87, ALT 90 and alkaline phosphatase 511.  Bilirubin 0.08. - ED physician discussed case with general surgery at [facility] consult for an patient admission, and they will decide drain placement versus operative management and surgical team will coordinate with IR. - In the ED patient received morphine, 1 L of LR bolus and 1 dose of IV Zosyn. --Spoke with on-call general surgery Dr. Hillery Hunter, will evaluate patient tonight and no plan for any surgical intervention tonight but will decide either cholecystectomy versus drain placement tomorrow.  Need to keep patient n.p.o. midnight. -Obtaining blood cultures. - Plan to  continue broad-spectrum antibiotic coverage with IV Zosyn with pharmacy consult - Continue maintenance fluid D5 LR 75 cc/h for 1 day. - Holding Eliquis for anticipation of surgery tomorrow and by the meantime  will do IV heparin drip which needs be hold 4 to 6-hours before surgery. -Clearly with diet now and keep pt n.p.o. after  midnight - Avoid doing Tylenol in the setting of transaminitis.  Normal renal function starting ibuprofen for mild pain and morphine for severe and breakthrough pain. -Continue Zofran as needed.  Chronic atrial fibrillation and atrial flutter on Eliquis -At home patient is on Eliquis 5 mg twice daily and and Toprol-XL 25 mg daily.   -EKG showed sinus rhythm and premature atrial complex.  Heart rate 88. -Continue cardiac monitoring. -Continue Toprol-XL 25 mg daily. - Holding Eliquis and starting heparin drip with pharmacy consult by the meantime for anticipation of surgery in the morning. -Continue cardiac monitoring  Essential hypertension -Blood pressure within good control. - Continue amlodipine 5 mg daily. -Continue hydralazine 5 mg as needed for SBP above 160 or DBP above 110.  Hold if heart rate above 100.  History of seizure -Reported taking (250 mg +500 mg) twice daily. -Continue Depakote 750 mg twice daily.    DVT prophylaxis:  IV heparin gtts Code Status:  Full Code Diet: Clear liquid diet now.  N.p.o. midnight Family Communication: Patient's son, wife and other family member at the bedside at the time of interview. Opportunity was given to ask question and all questions were answered satisfactorily.  Permission was taken to patient to discuss medical condition in front of the patient's and he stated he would has no problem and family can contribute as well. Disposition Plan: Per general surgery possible surgical intervention tomorrow. Consults: General Surgery Admission status:   Inpatient, cardiac-Telemetry bed  Severity of Illness: The  appropriate patient status for this patient is INPATIENT. Inpatient status is judged to be reasonable and necessary in order to provide the required intensity of service to ensure the patient's safety. The patient's presenting symptoms, physical exam findings, and initial radiographic and laboratory data in the context of their chronic comorbidities is felt to place them at high risk for further clinical deterioration. Furthermore, it is not anticipated that the patient will be medically stable for discharge from the hospital within 2 midnights of admission.   * I certify that at the point of admission it is my clinical judgment that the patient will require inpatient hospital care spanning beyond 2 midnights from the point of admission due to high intensity of service, high risk for further deterioration and high frequency of surveillance required.Marland Kitchen    Tereasa Coop, MD [facility]  How to contact the [facility] Attending or Consulting provider 7A - 7P or covering provider during after hours 7P -7A, for this patient.  Check the care team in [facility] and look for a) attending/consulting [facility] provider listed and b) the [facility] team listed Log into [email or web address] and use [place]'s universal password to access. If you do not have the password, please contact the hospital operator. Locate the [facility] provider you are looking for under [facility] and page to a number that you can be directly reached. If you still have difficulty reaching the provider, please page the [facility] (Director on Call) for the Hospitalists listed on amion for assistance.  12/30/2022, 8:13 PM

## 2022-12-30 NOTE — Care Plan (Signed)
Plan of Care Note for accepted transfer   Patient: Samuel Mayo MRN: [id number]   DOA: 12/30/2022  Facility requesting transfer: [facility] Requesting Provider: Dr. Andria Meuse Reason for transfer: Acute cholecystitis Facility course:  Patient is a pleasant 77 year old gentleman history of A-fib on chronic anticoagulation with Eliquis, hypertension, seizure disorder who presented to the ED with a 3-week history of right upper quadrant abdominal pain, anorexia with nausea significant improvement.  Patient noted to have been seen by PCP for jaundice abdominal ultrasound done 12/26/2022 concerning for acute cholecystitis and common bile duct dilatation.  Patient underwent MRI per PCP and while awaiting results due to ongoing abdominal pain presented to the ED via drawbridge.  EDP reviewed MRI abdomen findings which were consistent with acute cholecystitis with small pericholecystic abscess adjacent to the gallbladder fundus and involving the inferior left hepatic lobe.  Mild diffuse intrahepatic biliary ductal dilatation due to stricture of the common hepatic duct which appears to be secondary to hydration acute cholecystitis noted. Patient noted to be afebrile, tachycardia with a heart rate of 110.  Comprehensive metabolic profile with a sodium of 134, glucose of 131, BUN 29, phosphatase 511, AST 87, ALT 90 otherwise within normal limits.  CBC with a hemoglobin of 12.2 otherwise within normal limits.  Patient given IV fluids, IV Zosyn.  EDP discussed with general surgery at [facility], who recommended medical admission and general surgery will consult on patient to determine drain placement versus operative management and surgical team will coordinate with IR.   Plan of care: The patient is accepted for admission to Telemetry unit, at [facility]..   Author: Ramiro Harvest, MD 12/30/2022  Check [email or web address] for on-call coverage.  Nursing staff, Please call [facility] Admits & Consults System-Wide number  on Amion as soon as patient's arrival, so appropriate admitting provider can evaluate the pt.

## 2022-12-30 NOTE — ED Provider Notes (Signed)
[place] EMERGENCY DEPARTMENT AT [facility] Provider Note   CSN: [id number] Arrival date & time: 12/30/22  1521     History  Chief Complaint  Patient presents with   Abdominal Pain    Samuel Mayo is a 77 y.o. male.  77 year old male here today for weeks of abdominal pain, anorexia.  Patient had imaging done by his PCP yesterday.  He had an MRI done, was not aware of the results.  His MRI is available in our system and shows acute cholecystitis.   Abdominal Pain      Home Medications Prior to Admission medications   Medication Sig Start Date End Date Taking? Authorizing Provider  amLODipine (NORVASC) 5 MG tablet TAKE 1 TABLET (5 MG TOTAL) BY MOUTH DAILY. 01/11/22   [name], [name], MD  beta carotene w/minerals (OCUVITE) tablet Take 1 tablet by mouth daily.    [provider]  Cholecalciferol (VITAMIN D3) 50 MCG (2000 UT) TABS Take 2,000 tablets by mouth every evening.    [provider]  divalproex (DEPAKOTE) 250 MG DR tablet Take 1 tablet (250 mg total) by mouth 2 (two) times daily. Take in addition to 500 mg twice a day (pt should have a total of 750 mg twice a day) 08/26/22   Micki Riley, MD  divalproex (DEPAKOTE) 500 MG DR tablet Take 1 tablet (500 mg total) by mouth 2 (two) times daily. 05/20/22   Micki Riley, MD  ELIQUIS 5 MG TABS tablet TAKE 1 TABLET BY MOUTH TWICE A DAY 08/07/22   Mealor, Roberts Gaudy, MD  gabapentin (NEURONTIN) 300 MG capsule Take 1 capsule (300 mg total) by mouth at bedtime. 09/16/22   Micki Riley, MD  Homeopathic Products (ARNICARE) GEL Apply 1 Application topically 3 (three) times a week. On Ankle    [provider]  ibandronate (BONIVA) 150 MG tablet TAKE 1 TABLET BY MOUTH EVERY 30 DAYS. TAKE IN AM WITH FULL GLASS OF WATER ON EMPTY STOMACH AND DON'T TAKE ANYTHING ELSE BY MOUTH OR LIE DOWN FOR THE NEXT 30 MINUTES 07/22/22   [name], [name], MD  metoprolol succinate (TOPROL-XL) 25 MG 24 hr tablet TAKE 1  TABLET (25 MG TOTAL) BY MOUTH DAILY. 09/23/22   [name], MD  Multiple Vitamin (CALCIUM COMPLEX PO) Take 1 tablet by mouth 2 (two) times daily. Bone Up [place] Formulas    [provider]  Multiple Vitamin (MULTIVITAMIN WITH MINERALS) TABS tablet Take 1 tablet by mouth 2 (two) times daily. Vitamin Code Whole Food Multivitamin    [provider]      Allergies    Other    Review of Systems   Review of Systems  Gastrointestinal:  Positive for abdominal pain.    Physical Exam Updated Vital Signs BP (!) 142/94 (BP Location: Right Arm)   Pulse (!) 110   Temp 98.5 F (36.9 C) (Oral)   Resp 20   Ht 5\' 11"  (1.803 m)   Wt 88.5 kg   SpO2 96%   BMI 27.20 kg/m  Physical Exam Vitals reviewed.  Abdominal:     Palpations: Abdomen is soft.     Tenderness: There is abdominal tenderness in the right upper quadrant. Positive signs include Murphy's sign.     ED Results / Procedures / Treatments   Labs (all labs ordered are listed, but only abnormal results are displayed) Labs Reviewed  CBC - Abnormal; Notable for the following components:      Result Value  RBC 3.70 (*)    Hemoglobin 12.2 (*)    HCT 36.6 (*)    All other components within normal limits  LIPASE, BLOOD  COMPREHENSIVE METABOLIC PANEL  URINALYSIS, ROUTINE W REFLEX MICROSCOPIC    EKG None  Radiology MR Abdomen W Wo Contrast  Result Date: 12/30/2022 CLINICAL DATA:  Jaundice. Abnormal gallbladder and borderline biliary ductal dilatation on recent ultrasound. EXAM: MRI ABDOMEN WITHOUT AND WITH CONTRAST TECHNIQUE: Multiplanar multisequence MR imaging of the abdomen was performed both before and after the administration of intravenous contrast. CONTRAST:  9mL GADAVIST GADOBUTROL 1 MMOL/ML IV SOLN COMPARISON:  Ultrasound on 12/26/2022 FINDINGS: Lower chest: Mild bibasilar atelectasis and tiny right pleural effusion. Hepatobiliary: Tiny sub-cm cyst noted in segment 4 B of the left lobe. No evidence of  hepatic mass. The gallbladder is distended and shows diffuse wall thickening and pericholecystic edema, consistent with acute cholecystitis. A small rim enhancing fluid collection with surrounding edema is seen adjacent to the gallbladder fundus and involving the inferior left hepatic lobe. This measures 3.7 x 1.7 cm on image 49/11, consistent with small pericholecystic abscess. Mild dilatation of the intrahepatic bile ducts is seen due to mild stricture of the common hepatic duct which appears to be secondary to adjacent cholecystitis. Distal common bile duct is nondilated and there is no evidence of choledocholithiasis. Pancreas:  No mass or inflammatory changes. Spleen:  Within normal limits in size and appearance. Adrenals/Urinary Tract: No suspicious masses identified. No evidence of hydronephrosis. Stomach/Bowel: Unremarkable. Vascular/Lymphatic: No pathologically enlarged lymph nodes identified. No acute vascular findings. Other:  None. Musculoskeletal:  No suspicious bone lesions identified. IMPRESSION: Acute cholecystitis, with small pericholecystic abscess adjacent to the gallbladder fundus and involving the inferior left hepatic lobe. Mild diffuse intrahepatic biliary ductal dilatation due to stricture of the common hepatic duct, which appears to be secondary to adjacent acute cholecystitis. This is consistent with Mirizzi syndrome. No evidence of choledocholithiasis. Mild bibasilar atelectasis and tiny right pleural effusion. These results will be called to the ordering clinician or representative by the Radiologist Assistant, and communication documented in the PACS or [employer]. Electronically Signed   By: Danae Orleans M.D.   On: 12/30/2022 14:46    Procedures Procedures    Medications Ordered in ED Medications  piperacillin-tazobactam (ZOSYN) IVPB 3.375 g (3.375 g Intravenous New Bag/Given 12/30/22 1628)  morphine (PF) 4 MG/ML injection 4 mg (4 mg Intravenous Given 12/30/22 1629)   lactated ringers bolus 1,000 mL (1,000 mLs Intravenous New Bag/Given 12/30/22 1626)    ED Course/ Medical Decision Making/ A&P                                 Medical Decision Making 77 year old male here today for right upper quadrant pain.  Differential diagnoses include acute cholecystitis.  Plan # patient had an MRI of his abdomen yesterday which does show an acute cholecystitis.  This is consistent with his exam.  Looks as though he also has some fluid surrounding his gallbladder, likely a paracolic abscess.  Spoke with surgery PA, Lanora Manis, who is writing the case by the attending surgeon.  Patient likely require IR drain prior to cholecystectomy.  Surgery team requested medical admission.  Surgery team will coordinate with IR to determine drain placement versus operative management.  Patient has received IV antibiotics here in the emergency department.  Will transfer to [facility].  Amount and/or Complexity of Data Reviewed Labs: ordered.  Risk  Prescription drug management.           Final Clinical Impression(s) / ED Diagnoses Final diagnoses:  Acute cholecystitis    Rx / DC Orders ED Discharge Orders     None         Arletha Pili, DO 12/30/22 1631

## 2022-12-30 NOTE — Progress Notes (Signed)
PHARMACY - ANTICOAGULATION CONSULT NOTE  Pharmacy Consult for Heparin while Eliquis on hold Indication: atrial fibrillation  Allergies  Allergen Reactions   Other Other (See Comments)    Neosporin Ophthalmic causes eyes to turn red    Patient Measurements: Height: 5\' 11"  (180.3 cm) Weight: 88.5 kg (195 lb) IBW/kg (Calculated) : 75.3 Heparin Dosing Weight: total weight  Vital Signs: Temp: 98.3 F (36.8 C) (10/28 1927) Temp Source: Oral (10/28 1927) BP: 137/95 (10/28 1927) Pulse Rate: 87 (10/28 1927)  Labs: Recent Labs    12/30/22 1600  HGB 12.2*  HCT 36.6*  PLT 290  CREATININE 0.87    Estimated Creatinine Clearance: 74.5 mL/min (by C-G formula based on SCr of 0.87 mg/dL).   Medical History: Past Medical History:  Diagnosis Date   AK (actinic keratosis)    Allergy    Chicken pox    Compression fracture of L1 lumbar vertebra (HCC)    Compression fracture of thoracic vertebra (HCC)    Hypertension    Lentigines    Follows with DR Culton (derma)   Seizures (HCC)     Medications:  Scheduled:   [START ON 12/31/2022] amLODipine  5 mg Oral Daily   divalproex  250 mg Oral BID   gabapentin  300 mg Oral QHS   [START ON 12/31/2022] metoprolol succinate  25 mg Oral Daily   sodium chloride flush  3 mL Intravenous Q12H   Infusions:   sodium chloride     dextrose 5% lactated ringers     piperacillin-tazobactam     PRN: sodium chloride, ibuprofen, morphine injection, ondansetron **OR** ondansetron (ZOFRAN) IV, senna-docusate, sodium chloride flush  Assessment: 77 yo male on chronic Eliquis for afib who presents with acute cholecystitis. Pharmacy consulted to dose IV heparin while Eliquis on hold for possible surgery. Last dose of Eliquis this AM, therefore, will utilize aPTT for heparin monitoring as heparin levels will be falsely elevated with recent DOAC use.  Goal of Therapy:  Heparin level 0.3-0.7 units/ml aPTT 66-102 seconds Monitor platelets by  anticoagulation protocol: Yes   Plan:  No heparin bolus At 2100, begin IV heparin infusion at 1100 units/hr Check aPTT and HL 8hrs after starting, once levels correlate, can monitor using HL only Check daily CBC, monitor for signs/symptoms of bleeding F/u if heparin needs to be held prior to surgery Zosyn 3.375gm IV q8h (4hr extended infusions)  Loralee Pacas, PharmD, BCPS 12/30/2022,7:56 PM  Please check AMION for all [facility] Pharmacy phone numbers After 10:00 PM, call Main Pharmacy [phone]

## 2022-12-30 NOTE — ED Triage Notes (Signed)
Pt states he is having "gall bladder " pain. Worse for the last week. Denies N/V States he is having chills.

## 2022-12-31 ENCOUNTER — Encounter (HOSPITAL_COMMUNITY): Payer: Self-pay | Admitting: Internal Medicine

## 2022-12-31 ENCOUNTER — Ambulatory Visit: Payer: Medicare Other | Admitting: Internal Medicine

## 2022-12-31 DIAGNOSIS — K810 Acute cholecystitis: Secondary | ICD-10-CM | POA: Diagnosis not present

## 2022-12-31 LAB — HEPARIN LEVEL (UNFRACTIONATED): Heparin Unfractionated: 0.76 [IU]/mL — ABNORMAL HIGH (ref 0.30–0.70)

## 2022-12-31 LAB — COMPREHENSIVE METABOLIC PANEL
ALT: 100 U/L — ABNORMAL HIGH (ref 0–44)
AST: 95 U/L — ABNORMAL HIGH (ref 15–41)
Albumin: 2.6 g/dL — ABNORMAL LOW (ref 3.5–5.0)
Alkaline Phosphatase: 488 U/L — ABNORMAL HIGH (ref 38–126)
Anion gap: 11 (ref 5–15)
BUN: 21 mg/dL (ref 8–23)
CO2: 24 mmol/L (ref 22–32)
Calcium: 8.9 mg/dL (ref 8.9–10.3)
Chloride: 98 mmol/L (ref 98–111)
Creatinine, Ser: 1 mg/dL (ref 0.61–1.24)
GFR, Estimated: >60 mL/min (ref >60)
Glucose, Bld: 102 mg/dL — ABNORMAL HIGH (ref 70–99)
Potassium: 4.2 mmol/L (ref 3.5–5.1)
Sodium: 133 mmol/L — ABNORMAL LOW (ref 135–145)
Total Bilirubin: 1.1 mg/dL (ref 0.3–1.2)
Total Protein: 6.6 g/dL (ref 6.5–8.1)

## 2022-12-31 LAB — CBC
HCT: 40.3 % (ref 39.0–52.0)
Hemoglobin: 13.3 g/dL (ref 13.0–17.0)
MCH: 32.7 pg (ref 26.0–34.0)
MCHC: 33 g/dL (ref 30.0–36.0)
MCV: 99 fL (ref 80.0–100.0)
Platelets: 334 10*3/uL (ref 150–400)
RBC: 4.07 MIL/uL — ABNORMAL LOW (ref 4.22–5.81)
RDW: 13.4 % (ref 11.5–15.5)
WBC: 9.5 10*3/uL (ref 4.0–10.5)
nRBC: 0 % (ref 0.0–0.2)

## 2022-12-31 LAB — SURGICAL PCR SCREEN
MRSA, PCR: NEGATIVE
Staphylococcus aureus: NEGATIVE

## 2022-12-31 LAB — APTT
aPTT: 34 s (ref 24–36)
aPTT: 61 s — ABNORMAL HIGH (ref 24–36)

## 2022-12-31 MED ORDER — TRAZODONE HCL 50 MG PO TABS
50.0000 mg | ORAL_TABLET | Freq: Every evening | ORAL | Status: DC | PRN
  Administered 2023-01-01: 50 mg via ORAL
  Filled 2022-12-31: qty 1

## 2022-12-31 MED ORDER — ACETAMINOPHEN 325 MG PO TABS: 650.0000 mg | ORAL_TABLET | Freq: Four times a day (QID) | ORAL | Status: DC | PRN

## 2022-12-31 MED ORDER — GUAIFENESIN 100 MG/5ML PO LIQD: 5.0000 mL | ORAL | Status: DC | PRN

## 2022-12-31 MED ORDER — SENNOSIDES-DOCUSATE SODIUM 8.6-50 MG PO TABS: 1.0000 | ORAL_TABLET | Freq: Every evening | ORAL | Status: DC | PRN

## 2022-12-31 MED ORDER — OXYCODONE HCL 5 MG PO TABS
2.5000 mg | ORAL_TABLET | ORAL | Status: DC | PRN
  Administered 2023-01-02: 5 mg via ORAL
  Filled 2022-12-31: qty 1

## 2022-12-31 MED ORDER — MORPHINE SULFATE (PF) 2 MG/ML IV SOLN: 2.0000 mg | INTRAVENOUS | Status: DC | PRN

## 2022-12-31 MED ORDER — METOPROLOL TARTRATE 5 MG/5ML IV SOLN: 5.0000 mg | INTRAVENOUS | Status: DC | PRN

## 2022-12-31 MED ORDER — HYDRALAZINE HCL 20 MG/ML IJ SOLN: 10.0000 mg | INTRAMUSCULAR | Status: DC | PRN

## 2022-12-31 NOTE — Progress Notes (Addendum)
PHARMACY - ANTICOAGULATION CONSULT NOTE  Pharmacy Consult for Heparin while Eliquis on hold Indication: atrial fibrillation  Allergies  Allergen Reactions   Other Other (See Comments)    Neosporin Ophthalmic causes eyes to turn red    Patient Measurements: Height: 5\' 11"  (180.3 cm) Weight: 88.5 kg (195 lb) IBW/kg (Calculated) : 75.3 Heparin Dosing Weight: 88kg  Vital Signs: Temp: 99.1 F (37.3 C) (10/29 0800) Temp Source: Oral (10/29 0800) BP: 144/94 (10/29 0800) Pulse Rate: 102 (10/29 0800)  Labs: Recent Labs    12/30/22 1600 12/30/22 2151 12/31/22 0705 12/31/22 1747  HGB 12.2*  --  13.3  --   HCT 36.6*  --  40.3  --   PLT 290  --  334  --   APTT  --  36 34 61*  LABPROT  --  16.7*  --   --   INR  --  1.3*  --   --   HEPARINUNFRC  --  0.96* 0.76*  --   CREATININE 0.87  --  1.00  --     Estimated Creatinine Clearance: 64.8 mL/min (by C-G formula based on SCr of 1 mg/dL).   Medical History: Past Medical History:  Diagnosis Date   AK (actinic keratosis)    Allergy    Chicken pox    Compression fracture of L1 lumbar vertebra (HCC)    Compression fracture of thoracic vertebra (HCC)    Hypertension    Lentigines    Follows with DR Culton (derma)   Seizures (HCC)       Assessment: 77 yo male on chronic Eliquis for afib who presents with acute cholecystitis. Pharmacy consulted to dose IV heparin while Eliquis on hold for possible surgery. Last dose of Eliquis 10/28 AM, therefore, will utilize aPTT for heparin monitoring as heparin levels will be falsely elevated with recent DOAC use.  aPTT is increasing at 61 after increasing to 1350 units/hr, but remains slightly subtherapeutic.  No issues with infusion or bleeding per RN.  *Plan noted for IR percutaneous cholecystostomy drainage on Thursday 10/31.   Goal of Therapy:  Heparin level 0.3-0.7 units/ml aPTT 66-102 seconds Monitor platelets by anticoagulation protocol: Yes   Plan:   Increase heparin  infusion to 1450 units/hr F/u aPTT until correlates with heparin level  Repeat aPTT in ~8 hours (OK with AM labs) Monitor daily aPTT, heparin level, CBC, signs/symptoms of bleeding  F/u if heparin needs to be held prior to surgery or IR  Link Snuffer, PharmD, BCPS, BCCCP Please check AMION for all [facility] Pharmacy phone numbers After 10:00 PM, call Main Pharmacy [phone]

## 2022-12-31 NOTE — Consult Note (Signed)
Chief Complaint: Patient was seen in consultation today for percutaneous cholecystostomy drain placement Chief Complaint  Patient presents with   Abdominal Pain   at the request of Dr Alfonzo Beers  Supervising Physician: Roanna Banning  Patient Status: [facility] - In-pt  History of Present Illness: Samuel Mayo is a 77 y.o. male   FULL Code status per pt Hx MVR with annuloplasty Afib on Eliquis--- LD 10/28 1 mo abd pain with acute cholecystitis - poss Mirrizzi syndrome  [place] 10/24:   Gallbladder is distended with wall thickening and trace pericholecystic fluid. Small amount of sludge in the gallbladder lumen. Findings are equivocal for acute cholecystitis. Common bile duct is dilated measuring 6 mm. Given the multitude of findings, and jaundice, recommend further evaluation with MRI/MRCP to assess for causative etiology and exclude possible underlying mass.  Request made for percutaneous cholecystostomy drain placement Dr Milford Cage has reviewed imaging and approves procedure  LD Eliquis 10/28--- plan for 10/30 or 10/31  Past Medical History:  Diagnosis Date   AK (actinic keratosis)    Allergy    Chicken pox    Compression fracture of L1 lumbar vertebra (HCC)    Compression fracture of thoracic vertebra (HCC)    Hypertension    Lentigines    Follows with DR Culton (derma)   Seizures (HCC)     Past Surgical History:  Procedure Laterality Date   CARDIOVERSION N/A 05/14/2021   Procedure: CARDIOVERSION;  Surgeon: Little Ishikawa, MD;  Location: [facility] ENDOSCOPY;  Service: Cardiovascular;  Laterality: N/A;   CARDIOVERSION N/A 10/08/2021   Procedure: CARDIOVERSION;  Surgeon: Christell Constant, MD;  Location: [facility] ENDOSCOPY;  Service: Cardiovascular;  Laterality: N/A;   COLONOSCOPY     mitral vavle     Congetinal mitral valve disease.    Allergies: Other  Medications: Prior to Admission medications   Medication Sig Start Date End Date Taking? Authorizing Provider   amLODipine (NORVASC) 5 MG tablet TAKE 1 TABLET (5 MG TOTAL) BY MOUTH DAILY. 01/11/22  Yes [name], [name], MD  beta carotene w/minerals (OCUVITE) tablet Take 1 tablet by mouth every evening.   Yes [provider]  Cholecalciferol (VITAMIN D3) 50 MCG (2000 UT) TABS Take 2,000 tablets by mouth every evening.   Yes [provider]  divalproex (DEPAKOTE) 250 MG DR tablet Take 1 tablet (250 mg total) by mouth 2 (two) times daily. Take in addition to 500 mg twice a day (pt should have a total of 750 mg twice a day) 08/26/22  Yes Micki Riley, MD  divalproex (DEPAKOTE) 500 MG DR tablet Take 1 tablet (500 mg total) by mouth 2 (two) times daily. 05/20/22  Yes Micki Riley, MD  ELIQUIS 5 MG TABS tablet TAKE 1 TABLET BY MOUTH TWICE A DAY 08/07/22  Yes Mealor, Roberts Gaudy, MD  gabapentin (NEURONTIN) 300 MG capsule Take 1 capsule (300 mg total) by mouth at bedtime. 09/16/22  Yes Micki Riley, MD  Homeopathic Products (ARNICARE) GEL Apply 1 Application topically daily as needed (For pain). On Ankle   Yes [provider]  ibandronate (BONIVA) 150 MG tablet TAKE 1 TABLET BY MOUTH EVERY 30 DAYS. TAKE IN AM WITH FULL GLASS OF WATER ON EMPTY STOMACH AND DON'T TAKE ANYTHING ELSE BY MOUTH OR LIE DOWN FOR THE NEXT 30 MINUTES 07/22/22  Yes [name], [name], MD  metoprolol succinate (TOPROL-XL) 25 MG 24 hr tablet TAKE 1 TABLET (25 MG TOTAL) BY MOUTH DAILY. 09/23/22  Yes [name],  MD  Multiple Vitamin (MULTIVITAMIN WITH MINERALS) TABS tablet Take 1 tablet by mouth 2 (two) times daily. Vitamin Code Whole Food Multivitamin   Yes [provider]     Family History  Problem Relation Age of Onset   Heart disease Mother    Cancer Father    Heart disease Father    Prostate cancer Father    Aneurysm Paternal Grandfather    Heart disease Son        congenital mitral valve dz   Colon cancer Neg Hx    Esophageal cancer Neg Hx    Stomach cancer Neg Hx    Rectal cancer Neg Hx      Social History   Socioeconomic History   Marital status: Domestic Partner    Spouse name: Samuel Mayo   Number of children: Not on file   Years of education: Not on file   Highest education level: Bachelor's degree (e.g., BA, AB, BS)  Occupational History   Not on file  Tobacco Use   Smoking status: Never   Smokeless tobacco: Never   Tobacco comments:    Never smoke 10/29/21   Vaping Use   Vaping status: Never Used  Substance and Sexual Activity   Alcohol use: Yes    Alcohol/week: 14.0 standard drinks of alcohol    Types: 14 Standard drinks or equivalent per week    Comment: 2 glasses of wine nightly 10/29/21   Drug use: No   Sexual activity: Not on file  Other Topics Concern   Not on file  Social History Narrative   Lives with wife Samuel Mayo   R handed   Caffeine: 3 C of coffee AM   Social Determinants of Health   Financial Resource Strain: Low Risk  (10/21/2022)   Overall Financial Resource Strain (CARDIA)    Difficulty of Paying Living Expenses: Not hard at all  Food Insecurity: No Food Insecurity (12/30/2022)   Hunger Vital Sign    Worried About Running Out of Food in the Last Year: Never true    Ran Out of Food in the Last Year: Never true  Transportation Needs: No Transportation Needs (12/30/2022)   PRAPARE - Administrator, Civil Service (Medical): No    Lack of Transportation (Non-Medical): No  Physical Activity: Insufficiently Active (10/21/2022)   Exercise Vital Sign    Days of Exercise per Week: 3 days    Minutes of Exercise per Session: 40 min  Stress: Stress Concern Present (10/21/2022)   [employer] of Occupational Health - Occupational Stress Questionnaire    Feeling of Stress : Rather much  Social Connections: Moderately Integrated (10/21/2022)   Social Connection and Isolation Panel [NHANES]    Frequency of Communication with Friends and Family: Three times a week    Frequency of Social Gatherings with Friends and Family: Once a week     Attends Religious Services: Never    Database administrator or Organizations: No    Attends Engineer, structural: More than 4 times per year    Marital Status: Living with partner    Review of Systems: A 12 point ROS discussed and pertinent positives are indicated in the HPI above.  All other systems are negative.  Review of Systems  Constitutional:  Positive for activity change. Negative for fatigue and fever.  Respiratory:  Negative for shortness of breath.   Cardiovascular:  Negative for chest pain.  Gastrointestinal:  Positive for abdominal pain and nausea.  Psychiatric/Behavioral:  Negative for  behavioral problems and confusion.     Vital Signs: BP (!) 144/94   Pulse (!) 102   Temp 99.1 F (37.3 C) (Oral)   Resp 17   Ht 5\' 11"  (1.803 m)   Wt 195 lb (88.5 kg)   SpO2 97%   BMI 27.20 kg/m   Advance Care Plan: The advanced care plan/surrogate decision maker was discussed at the time of visit and documented in the medical record.    Physical Exam Vitals reviewed.  HENT:     Mouth/Throat:     Mouth: Mucous membranes are moist.  Cardiovascular:     Rate and Rhythm: Normal rate and regular rhythm.     Heart sounds: Normal heart sounds.  Pulmonary:     Effort: Pulmonary effort is normal.     Breath sounds: Normal breath sounds. No wheezing.  Abdominal:     Palpations: Abdomen is soft.     Tenderness: There is abdominal tenderness.  Skin:    General: Skin is warm and dry.  Neurological:     Mental Status: He is alert and oriented to person, place, and time.  Psychiatric:        Behavior: Behavior normal.     Imaging: MR Abdomen W Wo Contrast  Result Date: 12/30/2022 CLINICAL DATA:  Jaundice. Abnormal gallbladder and borderline biliary ductal dilatation on recent ultrasound. EXAM: MRI ABDOMEN WITHOUT AND WITH CONTRAST TECHNIQUE: Multiplanar multisequence MR imaging of the abdomen was performed both before and after the administration of intravenous  contrast. CONTRAST:  9mL GADAVIST GADOBUTROL 1 MMOL/ML IV SOLN COMPARISON:  Ultrasound on 12/26/2022 FINDINGS: Lower chest: Mild bibasilar atelectasis and tiny right pleural effusion. Hepatobiliary: Tiny sub-cm cyst noted in segment 4 B of the left lobe. No evidence of hepatic mass. The gallbladder is distended and shows diffuse wall thickening and pericholecystic edema, consistent with acute cholecystitis. A small rim enhancing fluid collection with surrounding edema is seen adjacent to the gallbladder fundus and involving the inferior left hepatic lobe. This measures 3.7 x 1.7 cm on image 49/11, consistent with small pericholecystic abscess. Mild dilatation of the intrahepatic bile ducts is seen due to mild stricture of the common hepatic duct which appears to be secondary to adjacent cholecystitis. Distal common bile duct is nondilated and there is no evidence of choledocholithiasis. Pancreas:  No mass or inflammatory changes. Spleen:  Within normal limits in size and appearance. Adrenals/Urinary Tract: No suspicious masses identified. No evidence of hydronephrosis. Stomach/Bowel: Unremarkable. Vascular/Lymphatic: No pathologically enlarged lymph nodes identified. No acute vascular findings. Other:  None. Musculoskeletal:  No suspicious bone lesions identified. IMPRESSION: Acute cholecystitis, with small pericholecystic abscess adjacent to the gallbladder fundus and involving the inferior left hepatic lobe. Mild diffuse intrahepatic biliary ductal dilatation due to stricture of the common hepatic duct, which appears to be secondary to adjacent acute cholecystitis. This is consistent with Mirizzi syndrome. No evidence of choledocholithiasis. Mild bibasilar atelectasis and tiny right pleural effusion. These results will be called to the ordering clinician or representative by the Radiologist Assistant, and communication documented in the PACS or [employer]. Electronically Signed   By: Danae Orleans M.D.   On:  12/30/2022 14:46   US ABDOMEN LIMITED RUQ (LIVER/GB)  Result Date: 12/26/2022 CLINICAL DATA:  Jaundice EXAM: ULTRASOUND ABDOMEN LIMITED RIGHT UPPER QUADRANT COMPARISON:  None Available. FINDINGS: Gallbladder: Gallbladder is distended with wall thickening measuring up to 6 mm. Trace pericholecystic fluid. Small amount of sludge in the gallbladder lumen. Common bile duct: Diameter: 6 mm, dilated  Liver: Increased echogenicity. Mildly nodular contour. No focal lesion. Portal vein is patent on color Doppler imaging with normal direction of blood flow towards the liver. Other: None. IMPRESSION: 1. Gallbladder is distended with wall thickening and trace pericholecystic fluid. Small amount of sludge in the gallbladder lumen. Findings are equivocal for acute cholecystitis. Common bile duct is dilated measuring 6 mm. Given the multitude of findings, and jaundice, recommend further evaluation with MRI/MRCP to assess for causative etiology and exclude possible underlying mass. 2. These results will be called to the ordering clinician or representative by the Radiologist Assistant, and communication documented in the PACS or [employer]. Electronically Signed   By: Annia Belt M.D.   On: 12/26/2022 08:58    Labs:  CBC: Recent Labs    12/23/22 1055 12/27/22 0946 12/30/22 1600 12/31/22 0705  WBC 6.6 11.5* 5.9 9.5  HGB 13.0 12.7* 12.2* 13.3  HCT 39.5 39.3 36.6* 40.3  PLT 209.0 296.0 290 334    COAGS: Recent Labs    12/30/22 2151 12/31/22 0705  INR 1.3*  --   APTT 36 34    BMP: Recent Labs    12/23/22 1055 12/27/22 0946 12/30/22 1600 12/31/22 0705  NA 131* 132* 134* 133*  K 4.4 4.4 4.3 4.2  CL 95* 95* 99 98  CO2 27 29 26 24   GLUCOSE 111* 116* 131* 102*  BUN 23 31* 29* 21  CALCIUM 9.1 9.0 9.3 8.9  CREATININE 1.12 1.08 0.87 1.00  GFRNONAA  --   --  >60 >60    LIVER FUNCTION TESTS: Recent Labs    12/23/22 1055 12/27/22 0946 12/30/22 1600 12/31/22 0705  BILITOT 0.7 1.0 0.8 1.1   AST 49* 28 87* 95*  ALT 108* 50 90* 100*  ALKPHOS 303* 362* 511* 488*  PROT 6.6 6.8 6.5 6.6  ALBUMIN 3.6 3.5 3.5 2.6*    TUMOR MARKERS: No results for input(s): "AFPTM", "CEA", "CA199", "CHROMGRNA" in the last 8760 hours.  Assessment and Plan:  Scheduled for percutaneous cholecystostomy drain placement Risks and benefits discussed with the patient including, but not limited to bleeding, infection, gallbladder perforation, bile leak, sepsis or even death.  All of the patient's questions were answered, patient is agreeable to proceed. Consent signed and in chart.  Thank you for this interesting consult.  I greatly enjoyed meeting Coltrane Tirabassi and look forward to participating in their care.  A copy of this report was sent to the requesting provider on this date.  Electronically Signed: Robet Leu, PA-C 12/31/2022, 11:06 AM   I spent a total of 20 Minutes    in face to face in clinical consultation, greater than 50% of which was counseling/coordinating care for percutaneous cholecystostomy drain placement

## 2022-12-31 NOTE — Progress Notes (Signed)
   12/31/22 1240  Mobility  Activity Ambulated with assistance in hallway  Level of Assistance Contact guard assist, steadying assist  Assistive Device Other (Comment) (IV Pole)  Distance Ambulated (ft) 225 ft  Activity Response Tolerated well  Mobility Referral Yes  $Mobility charge 1 Mobility  Mobility Specialist Start Time (ACUTE ONLY) 1208  Mobility Specialist Stop Time (ACUTE ONLY) 1240  Mobility Specialist Time Calculation (min) (ACUTE ONLY) 32 min   Mobility Specialist: Progress Note  Pt agreeable to mobility session - received in bed. Required CG using IV Pole. Pt was asymptomatic throughout session with no complaints. Returned to chair with all needs met - call bell within reach. Family present.  Barnie Mort, BS Mobility Specialist Please contact via SecureChat or Rehab office at [phone].

## 2022-12-31 NOTE — Progress Notes (Signed)
General Surgery Follow Up Note  Subjective:    Overnight Issues:   Objective:  Vital signs for last 24 hours: Temp:  [98.2 F (36.8 C)-99.1 F (37.3 C)] 99.1 F (37.3 C) (10/29 0800) Pulse Rate:  [66-110] 102 (10/29 0800) Resp:  [17-22] 17 (10/29 0800) BP: (102-153)/(73-95) 144/94 (10/29 0800) SpO2:  [92 %-98 %] 97 % (10/29 0800) Weight:  [88.5 kg] 88.5 kg (10/28 1546)  Hemodynamic parameters for last 24 hours:    Intake/Output from previous day: 10/28 0701 - 10/29 0700 In: 1050 [IV Piggyback:1050] Out: -   Intake/Output this shift: No intake/output data recorded.  Vent settings for last 24 hours:    Physical Exam:  Gen: comfortable, no distress Neuro: follows commands, alert, communicative HEENT: PERRL Neck: supple CV: RRR Pulm: unlabored breathing on RA Abd: soft, RUQ TTP    GU: urine clear and yellow, +spontaneous void Extr: wwp, no edema  Results for orders placed or performed during the hospital encounter of 12/30/22 (from the past 24 hour(s))  Lipase, blood     Status: None   Collection Time: 12/30/22  4:00 PM  Result Value Ref Range   Lipase 49 11 - 51 U/L  Comprehensive metabolic panel     Status: Abnormal   Collection Time: 12/30/22  4:00 PM  Result Value Ref Range   Sodium 134 (L) 135 - 145 mmol/L   Potassium 4.3 3.5 - 5.1 mmol/L   Chloride 99 98 - 111 mmol/L   CO2 26 22 - 32 mmol/L   Glucose, Bld 131 (H) 70 - 99 mg/dL   BUN 29 (H) 8 - 23 mg/dL   Creatinine, Ser 1.61 0.61 - 1.24 mg/dL   Calcium 9.3 8.9 - 09.6 mg/dL   Total Protein 6.5 6.5 - 8.1 g/dL   Albumin 3.5 3.5 - 5.0 g/dL   AST 87 (H) 15 - 41 U/L   ALT 90 (H) 0 - 44 U/L   Alkaline Phosphatase 511 (H) 38 - 126 U/L   Total Bilirubin 0.8 0.3 - 1.2 mg/dL   GFR, Estimated >04 >54 mL/min   Anion gap 9 5 - 15  CBC     Status: Abnormal   Collection Time: 12/30/22  4:00 PM  Result Value Ref Range   WBC 5.9 4.0 - 10.5 K/uL   RBC 3.70 (L) 4.22 - 5.81 MIL/uL   Hemoglobin 12.2 (L) 13.0 -  17.0 g/dL   HCT 09.8 (L) 11.9 - 14.7 %   MCV 98.9 80.0 - 100.0 fL   MCH 33.0 26.0 - 34.0 pg   MCHC 33.3 30.0 - 36.0 g/dL   RDW 82.9 56.2 - 13.0 %   Platelets 290 150 - 400 K/uL   nRBC 0.0 0.0 - 0.2 %  Urinalysis, Routine w reflex microscopic -Urine, Clean Catch     Status: Abnormal   Collection Time: 12/30/22  4:00 PM  Result Value Ref Range   Color, Urine YELLOW YELLOW   APPearance CLEAR CLEAR   Specific Gravity, Urine 1.042 (H) 1.005 - 1.030   pH 6.0 5.0 - 8.0   Glucose, UA NEGATIVE NEGATIVE mg/dL   Hgb urine dipstick NEGATIVE NEGATIVE   Bilirubin Urine NEGATIVE NEGATIVE   Ketones, ur TRACE (A) NEGATIVE mg/dL   Protein, ur 30 (A) NEGATIVE mg/dL   Nitrite NEGATIVE NEGATIVE   Leukocytes,Ua NEGATIVE NEGATIVE   RBC / HPF 0-5 0 - 5 RBC/hpf   WBC, UA 0-5 0 - 5 WBC/hpf   Bacteria, UA NONE SEEN NONE  SEEN   Squamous Epithelial / HPF 0-5 0 - 5 /HPF   Mucus PRESENT    Hyaline Casts, UA PRESENT   APTT     Status: None   Collection Time: 12/30/22  9:51 PM  Result Value Ref Range   aPTT 36 24 - 36 seconds  Protime-INR     Status: Abnormal   Collection Time: 12/30/22  9:51 PM  Result Value Ref Range   Prothrombin Time 16.7 (H) 11.4 - 15.2 seconds   INR 1.3 (H) 0.8 - 1.2  Heparin level (unfractionated)     Status: Abnormal   Collection Time: 12/30/22  9:51 PM  Result Value Ref Range   Heparin Unfractionated 0.96 (H) 0.30 - 0.70 IU/mL  Surgical PCR screen     Status: None   Collection Time: 12/31/22  5:00 AM   Specimen: Nasal Mucosa; Nasal Swab  Result Value Ref Range   MRSA, PCR NEGATIVE NEGATIVE   Staphylococcus aureus NEGATIVE NEGATIVE  Heparin level (unfractionated)     Status: Abnormal   Collection Time: 12/31/22  7:05 AM  Result Value Ref Range   Heparin Unfractionated 0.76 (H) 0.30 - 0.70 IU/mL  APTT     Status: None   Collection Time: 12/31/22  7:05 AM  Result Value Ref Range   aPTT 34 24 - 36 seconds  CBC     Status: Abnormal   Collection Time: 12/31/22  7:05 AM   Result Value Ref Range   WBC 9.5 4.0 - 10.5 K/uL   RBC 4.07 (L) 4.22 - 5.81 MIL/uL   Hemoglobin 13.3 13.0 - 17.0 g/dL   HCT 09.8 11.9 - 14.7 %   MCV 99.0 80.0 - 100.0 fL   MCH 32.7 26.0 - 34.0 pg   MCHC 33.0 30.0 - 36.0 g/dL   RDW 82.9 56.2 - 13.0 %   Platelets 334 150 - 400 K/uL   nRBC 0.0 0.0 - 0.2 %  Comprehensive metabolic panel     Status: Abnormal   Collection Time: 12/31/22  7:05 AM  Result Value Ref Range   Sodium 133 (L) 135 - 145 mmol/L   Potassium 4.2 3.5 - 5.1 mmol/L   Chloride 98 98 - 111 mmol/L   CO2 24 22 - 32 mmol/L   Glucose, Bld 102 (H) 70 - 99 mg/dL   BUN 21 8 - 23 mg/dL   Creatinine, Ser 8.65 0.61 - 1.24 mg/dL   Calcium 8.9 8.9 - 78.4 mg/dL   Total Protein 6.6 6.5 - 8.1 g/dL   Albumin 2.6 (L) 3.5 - 5.0 g/dL   AST 95 (H) 15 - 41 U/L   ALT 100 (H) 0 - 44 U/L   Alkaline Phosphatase 488 (H) 38 - 126 U/L   Total Bilirubin 1.1 0.3 - 1.2 mg/dL   GFR, Estimated >69 >62 mL/min   Anion gap 11 5 - 15    Assessment & Plan: The plan of care was discussed with the bedside nurse for the day, who is in agreement with this plan and no additional concerns were raised.   Present on Admission:  Acute cholecystitis  Hypertension  Atrial fibrillation, chronic (HCC)    LOS: 1 day   Additional comments:I reviewed the patient's new clinical lab test results.   and I reviewed the patients new imaging test results.    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 If not, okay for CLD.  - Continue to hold Eliquis. If deemed indicated by primary team, okay for heparin drip and hold 6h  prior to cholecystostomy tube placement.    Diamantina Monks, MD Trauma & General Surgery Please use [email or web address] to contact on call provider  12/31/2022  *Care during the described time interval was provided by me. I have reviewed this patient's available data, including medical history, events of note, physical examination and test results as part of my evaluation.

## 2022-12-31 NOTE — Plan of Care (Signed)
  Problem: Pain Management: Goal: General experience of comfort will improve Outcome: Not Progressing   Problem: Education: Goal: Knowledge of General Education information will improve Description: Including pain rating scale, medication(s)/side effects and non-pharmacologic comfort measures Outcome: Progressing   Problem: Health Behavior/Discharge Planning: Goal: Ability to manage health-related needs will improve Outcome: Progressing   Problem: Clinical Measurements: Goal: Ability to maintain clinical measurements within normal limits will improve Outcome: Progressing   Problem: Activity: Goal: Risk for activity intolerance will decrease Outcome: Progressing   Problem: Nutrition: Goal: Adequate nutrition will be maintained Outcome: Progressing   Problem: Coping: Goal: Level of anxiety will decrease Outcome: Progressing

## 2022-12-31 NOTE — Progress Notes (Signed)
PROGRESS NOTE    Samuel Mayo  ZOX:[id number] DOB: Oct 25, 1945 DOA: 12/30/2022 PCP: [name], [name], MD   Brief Narrative:  77 year old with history of MR status post annuloplasty, A-flutter/A-fib on Eliquis, HTN, seizures comes to the ED with abdominal pain, nausea and anorexia.  Workup showed acute cholecystitis with CBD dilation.  Also noted to have transaminitis.  Patient started on broad-spectrum antibiotics, general surgery and IR were consulted.   Assessment & Plan:  Principal Problem:   Acute cholecystitis Active Problems:   Atrial fibrillation, chronic (HCC)   Hypertension   Seizure (HCC)   Acute cholecystitis with CBD dilation and transaminitis MRI of the abdomen is consistent with acute cholecystitis/Mirizzi syndrome.  LFTs are rising.  General surgery and IR has been consulted.  Timing of intervention per their service.  Continue IV antibiotics.  NPO.  Pain control.   History of chronic atrial fibrillation/flutter Continue home Toprol-XL.  Holding Eliquis, on IV heparin drip   Essential hypertension Norvasc 5 mg daily.  IV as needed   History of seizure Continue home Depakote   DVT prophylaxis:   heparin drip Code Status: Full code Family Communication: Wife present at bedside Disposition Plan: Further plans per general surgery.  Continue hospital stay.  Subjective:  Patient seen and examined at bedside.  Does not have any abdominal pain this morning.  Overall feeling okay. His last dose of Eliquis was yesterday morning  Examination:  General exam: Appears calm and comfortable  Respiratory system: Clear to auscultation. Respiratory effort normal. Cardiovascular system: S1 & S2 heard, RRR. No JVD, murmurs, rubs, gallops or clicks. No pedal edema. Gastrointestinal system: Abdomen is nondistended, soft and nontender. No organomegaly or masses felt. Normal bowel sounds heard. Central nervous system: Alert and oriented. No focal neurological  deficits. Extremities: Symmetric 5 x 5 power. Skin: No rashes, lesions or ulcers Psychiatry: Judgement and insight appear normal. Mood & affect appropriate.      Diet Orders (From admission, onward)     Start     Ordered   12/31/22 0001  Diet NPO time specified Except for: Ice Chips, Sips with Meds  Diet effective midnight       Question Answer Comment  Except for Ice Chips   Except for Sips with Meds      12/30/22 1947            Objective: Vitals:   12/30/22 2149 12/31/22 0108 12/31/22 0519 12/31/22 0800  BP: (!) 137/95 102/73 (!) 153/91 (!) 144/94  Pulse: 87 66 80 (!) 102  Resp:    17  Temp:  98.2 F (36.8 C) 98.4 F (36.9 C) 99.1 F (37.3 C)  TempSrc:  Oral Oral Oral  SpO2:  95% 96% 97%  Weight:      Height:        Intake/Output Summary (Last 24 hours) at 12/31/2022 0843 Last data filed at 12/30/2022 1740 Gross per 24 hour  Intake 1050 ml  Output --  Net 1050 ml   Filed Weights   12/30/22 1546  Weight: 88.5 kg    Scheduled Meds:  amLODipine  5 mg Oral Daily   divalproex  750 mg Oral BID   gabapentin  300 mg Oral QHS   metoprolol succinate  25 mg Oral Daily   sodium chloride flush  3 mL Intravenous Q12H   Continuous Infusions:  sodium chloride     dextrose 5% lactated ringers 75 mL/hr at 12/30/22 2037   heparin 1,350 Units/hr (12/31/22 0825)   piperacillin-tazobactam  3.375 g (12/31/22 0555)    Nutritional status     Body mass index is 27.2 kg/m.  Data Reviewed:   CBC: Recent Labs  Lab 12/27/22 0946 12/30/22 1600 12/31/22 0705  WBC 11.5* 5.9 9.5  NEUTROABS 7.8*  --   --   HGB 12.7* 12.2* 13.3  HCT 39.3 36.6* 40.3  MCV 101.3* 98.9 99.0  PLT 296.0 290 334   Basic Metabolic Panel: Recent Labs  Lab 12/27/22 0946 12/30/22 1600 12/31/22 0705  NA 132* 134* 133*  K 4.4 4.3 4.2  CL 95* 99 98  CO2 29 26 24   GLUCOSE 116* 131* 102*  BUN 31* 29* 21  CREATININE 1.08 0.87 1.00  CALCIUM 9.0 9.3 8.9   GFR: Estimated Creatinine  Clearance: 64.8 mL/min (by C-G formula based on SCr of 1 mg/dL). Liver Function Tests: Recent Labs  Lab 12/27/22 0946 12/30/22 1600 12/31/22 0705  AST 28 87* 95*  ALT 50 90* 100*  ALKPHOS 362* 511* 488*  BILITOT 1.0 0.8 1.1  PROT 6.8 6.5 6.6  ALBUMIN 3.5 3.5 2.6*   Recent Labs  Lab 12/30/22 1600  LIPASE 49   No results for input(s): "AMMONIA" in the last 168 hours. Coagulation Profile: Recent Labs  Lab 12/30/22 2151  INR 1.3*   Cardiac Enzymes: No results for input(s): "CKTOTAL", "CKMB", "CKMBINDEX", "TROPONINI" in the last 168 hours. BNP (last 3 results) No results for input(s): "PROBNP" in the last 8760 hours. HbA1C: No results for input(s): "HGBA1C" in the last 72 hours. CBG: No results for input(s): "GLUCAP" in the last 168 hours. Lipid Profile: No results for input(s): "CHOL", "HDL", "LDLCALC", "TRIG", "CHOLHDL", "LDLDIRECT" in the last 72 hours. Thyroid Function Tests: No results for input(s): "TSH", "T4TOTAL", "FREET4", "T3FREE", "THYROIDAB" in the last 72 hours. Anemia Panel: No results for input(s): "VITAMINB12", "FOLATE", "FERRITIN", "TIBC", "IRON", "RETICCTPCT" in the last 72 hours. Sepsis Labs: No results for input(s): "PROCALCITON", "LATICACIDVEN" in the last 168 hours.  Recent Results (from the past 240 hour(s))  Surgical PCR screen     Status: None   Collection Time: 12/31/22  5:00 AM   Specimen: Nasal Mucosa; Nasal Swab  Result Value Ref Range Status   MRSA, PCR NEGATIVE NEGATIVE Final   Staphylococcus aureus NEGATIVE NEGATIVE Final    Comment: (NOTE) The Xpert SA Assay (FDA approved for NASAL specimens in patients 43 years of age and older), is one component of a comprehensive surveillance program. It is not intended to diagnose infection nor to guide or monitor treatment. Performed at [facility], [address] [place], [place] [address]          Radiology Studies: MR Abdomen W Wo Contrast  Result Date: 12/30/2022 CLINICAL  DATA:  Jaundice. Abnormal gallbladder and borderline biliary ductal dilatation on recent ultrasound. EXAM: MRI ABDOMEN WITHOUT AND WITH CONTRAST TECHNIQUE: Multiplanar multisequence MR imaging of the abdomen was performed both before and after the administration of intravenous contrast. CONTRAST:  9mL GADAVIST GADOBUTROL 1 MMOL/ML IV SOLN COMPARISON:  Ultrasound on 12/26/2022 FINDINGS: Lower chest: Mild bibasilar atelectasis and tiny right pleural effusion. Hepatobiliary: Tiny sub-cm cyst noted in segment 4 B of the left lobe. No evidence of hepatic mass. The gallbladder is distended and shows diffuse wall thickening and pericholecystic edema, consistent with acute cholecystitis. A small rim enhancing fluid collection with surrounding edema is seen adjacent to the gallbladder fundus and involving the inferior left hepatic lobe. This measures 3.7 x 1.7 cm on image 49/11, consistent with small  pericholecystic abscess. Mild dilatation of the intrahepatic bile ducts is seen due to mild stricture of the common hepatic duct which appears to be secondary to adjacent cholecystitis. Distal common bile duct is nondilated and there is no evidence of choledocholithiasis. Pancreas:  No mass or inflammatory changes. Spleen:  Within normal limits in size and appearance. Adrenals/Urinary Tract: No suspicious masses identified. No evidence of hydronephrosis. Stomach/Bowel: Unremarkable. Vascular/Lymphatic: No pathologically enlarged lymph nodes identified. No acute vascular findings. Other:  None. Musculoskeletal:  No suspicious bone lesions identified. IMPRESSION: Acute cholecystitis, with small pericholecystic abscess adjacent to the gallbladder fundus and involving the inferior left hepatic lobe. Mild diffuse intrahepatic biliary ductal dilatation due to stricture of the common hepatic duct, which appears to be secondary to adjacent acute cholecystitis. This is consistent with Mirizzi syndrome. No evidence of choledocholithiasis.  Mild bibasilar atelectasis and tiny right pleural effusion. These results will be called to the ordering clinician or representative by the Radiologist Assistant, and communication documented in the PACS or [employer]. Electronically Signed   By: Danae Orleans M.D.   On: 12/30/2022 14:46           LOS: 1 day   Time spent= 35 mins    Miguel Rota, MD [facility]  If 7PM-7AM, please contact night-coverage  12/31/2022, 8:43 AM

## 2022-12-31 NOTE — Progress Notes (Signed)
PHARMACY - ANTICOAGULATION CONSULT NOTE  Pharmacy Consult for Heparin while Eliquis on hold Indication: atrial fibrillation  Allergies  Allergen Reactions   Other Other (See Comments)    Neosporin Ophthalmic causes eyes to turn red    Patient Measurements: Height: 5\' 11"  (180.3 cm) Weight: 88.5 kg (195 lb) IBW/kg (Calculated) : 75.3 Heparin Dosing Weight: 88kg  Vital Signs: Temp: 98.4 F (36.9 C) (10/29 0519) Temp Source: Oral (10/29 0519) BP: 153/91 (10/29 0519) Pulse Rate: 80 (10/29 0519)  Labs: Recent Labs    12/30/22 1600 12/30/22 2151 12/31/22 0705  HGB 12.2*  --  13.3  HCT 36.6*  --  40.3  PLT 290  --  334  APTT  --  36 34  LABPROT  --  16.7*  --   INR  --  1.3*  --   HEPARINUNFRC  --  0.96* 0.76*  CREATININE 0.87  --   --     Estimated Creatinine Clearance: 74.5 mL/min (by C-G formula based on SCr of 0.87 mg/dL).   Medical History: Past Medical History:  Diagnosis Date   AK (actinic keratosis)    Allergy    Chicken pox    Compression fracture of L1 lumbar vertebra (HCC)    Compression fracture of thoracic vertebra (HCC)    Hypertension    Lentigines    Follows with DR Culton (derma)   Seizures (HCC)       Assessment: 77 yo male on chronic Eliquis for afib who presents with acute cholecystitis. Pharmacy consulted to dose IV heparin while Eliquis on hold for possible surgery. Last dose of Eliquis 10/28 AM, therefore, will utilize aPTT for heparin monitoring as heparin levels will be falsely elevated with recent DOAC use.  aPTT 34 is subtherapeutic and not correlating with heparin level 0.76 yet on 1100 units/hr.  No issues with infusion or bleeding per RN.  Goal of Therapy:  Heparin level 0.3-0.7 units/ml aPTT 66-102 seconds Monitor platelets by anticoagulation protocol: Yes   Plan:   Increase heparin infusion to 1350 units/hr F/u aPTT until correlates with heparin level  Monitor daily aPTT, heparin level, CBC, signs/symptoms of bleeding   F/u if heparin needs to be held prior to surgery   Alphia Moh, PharmD, BCPS, BCCP Clinical Pharmacist  Please check AMION for all [facility] Pharmacy phone numbers After 10:00 PM, call Main Pharmacy [phone]

## 2023-01-01 DIAGNOSIS — K810 Acute cholecystitis: Secondary | ICD-10-CM | POA: Diagnosis not present

## 2023-01-01 LAB — COMPREHENSIVE METABOLIC PANEL
ALT: 51 U/L — ABNORMAL HIGH (ref 0–44)
AST: 43 U/L — ABNORMAL HIGH (ref 15–41)
Albumin: 2 g/dL — ABNORMAL LOW (ref 3.5–5.0)
Alkaline Phosphatase: 308 U/L — ABNORMAL HIGH (ref 38–126)
Anion gap: 14 (ref 5–15)
BUN: 14 mg/dL (ref 8–23)
CO2: 20 mmol/L — ABNORMAL LOW (ref 22–32)
Calcium: 7.8 mg/dL — ABNORMAL LOW (ref 8.9–10.3)
Chloride: 95 mmol/L — ABNORMAL LOW (ref 98–111)
Creatinine, Ser: 1 mg/dL (ref 0.61–1.24)
GFR, Estimated: >60 mL/min (ref >60)
Glucose, Bld: 94 mg/dL (ref 70–99)
Potassium: 3.6 mmol/L (ref 3.5–5.1)
Sodium: 129 mmol/L — ABNORMAL LOW (ref 135–145)
Total Bilirubin: 0.6 mg/dL (ref 0.3–1.2)
Total Protein: 5.2 g/dL — ABNORMAL LOW (ref 6.5–8.1)

## 2023-01-01 LAB — CBC
HCT: 33.9 % — ABNORMAL LOW (ref 39.0–52.0)
Hemoglobin: 11.9 g/dL — ABNORMAL LOW (ref 13.0–17.0)
MCH: 33.8 pg (ref 26.0–34.0)
MCHC: 35.1 g/dL (ref 30.0–36.0)
MCV: 96.3 fL (ref 80.0–100.0)
Platelets: 232 10*3/uL (ref 150–400)
RBC: 3.52 MIL/uL — ABNORMAL LOW (ref 4.22–5.81)
RDW: 13.4 % (ref 11.5–15.5)
WBC: 7.7 10*3/uL (ref 4.0–10.5)
nRBC: 0 % (ref 0.0–0.2)

## 2023-01-01 LAB — MAGNESIUM: Magnesium: 1.8 mg/dL (ref 1.7–2.4)

## 2023-01-01 LAB — HEPARIN LEVEL (UNFRACTIONATED)
Heparin Unfractionated: 0.2 [IU]/mL — ABNORMAL LOW (ref 0.30–0.70)
Heparin Unfractionated: 0.3 [IU]/mL (ref 0.30–0.70)

## 2023-01-01 LAB — APTT: aPTT: 43 s — ABNORMAL HIGH (ref 24–36)

## 2023-01-01 LAB — PHOSPHORUS: Phosphorus: 3.3 mg/dL (ref 2.5–4.6)

## 2023-01-01 MED ORDER — ENSURE ENLIVE PO LIQD
237.0000 mL | Freq: Two times a day (BID) | ORAL | Status: DC
  Administered 2023-01-01 – 2023-01-03 (×3): 237 mL via ORAL

## 2023-01-01 MED ORDER — HEPARIN (PORCINE) 25000 UT/250ML-% IV SOLN
1650.0000 [IU]/h | INTRAVENOUS | Status: DC
  Administered 2023-01-02: 1650 [IU]/h via INTRAVENOUS
  Filled 2023-01-01: qty 250

## 2023-01-01 MED ORDER — GADOBUTROL 1 MMOL/ML IV SOLN
9.0000 mL | Freq: Once | INTRAVENOUS | Status: AC | PRN
Start: 2023-01-01 — End: 2022-12-29
  Administered 2022-12-29: 9 mL via INTRAVENOUS

## 2023-01-01 MED ORDER — SODIUM CHLORIDE 0.9 % IV BOLUS
500.0000 mL | Freq: Once | INTRAVENOUS | Status: AC
  Administered 2023-01-01: 500 mL via INTRAVENOUS

## 2023-01-01 NOTE — Care Management Important Message (Signed)
Important Message  Patient Details  Name: Braxton Colandrea MRN: [id number] Date of Birth: 04-Jun-1945   Important Message Given:  Yes - Medicare IM     Sherilyn Banker 01/01/2023, 12:37 PM

## 2023-01-01 NOTE — Progress Notes (Signed)
PROGRESS NOTE    Samuel Mayo  ZOX:[id number] DOB: Feb 05, 1946 DOA: 12/30/2022 PCP: [name], [name], MD     Brief Narrative:  76 year old with history of MR status post annuloplasty, A-flutter/A-fib on Eliquis, HTN, seizures comes to the ED with abdominal pain, nausea and anorexia.  Workup showed acute cholecystitis with CBD dilation.  Also noted to have transaminitis.  Patient started on broad-spectrum antibiotics, general surgery and IR were consulted.  IR planning percutaneous drain on 10/31.     Assessment & Plan:  Principal Problem:   Acute cholecystitis Active Problems:   Atrial fibrillation, chronic (HCC)   Hypertension   Seizure (HCC)   Acute cholecystitis with CBD dilation and transaminitis MRI of the abdomen is consistent with acute cholecystitis/Mirizzi syndrome.  LFTs are rising.  General surgery following, continue IV antibiotics.  IR planning on percutaneous drain on 10/31.  Allowing Eliquis washout during this time   History of chronic atrial fibrillation/flutter Continue home Toprol-XL.  Holding Eliquis, on IV heparin drip   Essential hypertension Norvasc 5 mg daily.  IV as needed   History of seizure Continue home Depakote   DVT prophylaxis:   heparin drip Code Status: Full code Family Communication: Wife present at bedside Disposition Plan: Further plans per general surgery.  Continue hospital stay.   Subjective: No new complaints.  Was sitting in the chair this morning now back in bed.   Examination:   General exam: Appears calm and comfortable  Respiratory system: Clear to auscultation. Respiratory effort normal. Cardiovascular system: S1 & S2 heard, RRR. No JVD, murmurs, rubs, gallops or clicks. No pedal edema. Gastrointestinal system: Abdomen is nondistended, soft and nontender. No organomegaly or masses felt. Normal bowel sounds heard. Central nervous system: Alert and oriented. No focal neurological deficits. Extremities: Symmetric 5 x 5  power. Skin: No rashes, lesions or ulcers Psychiatry: Judgement and insight appear normal. Mood & affect appropriate.        Diet Orders (From admission, onward)     Start     Ordered   01/02/23 0001  Diet NPO time specified Except for: Sips with Meds  Diet effective midnight       Question:  Except for  Answer:  Clearance Coots with Meds   01/01/23 0523   12/31/22 1156  Diet clear liquid Room service appropriate? Yes; Fluid consistency: Thin  Diet effective now       Question Answer Comment  Room service appropriate? Yes   Fluid consistency: Thin      12/31/22 1156            Objective: Vitals:   01/01/23 0734 01/01/23 1000 01/01/23 1011 01/01/23 1047  BP: (!) 145/81 (!) 89/69 106/71 129/80  Pulse: 94 (!) 103 (!) 116 99  Resp: 16     Temp: 98.1 F (36.7 C)     TempSrc: Oral     SpO2: 93%     Weight:      Height:        Intake/Output Summary (Last 24 hours) at 01/01/2023 1145 Last data filed at 01/01/2023 0528 Gross per 24 hour  Intake 370 ml  Output --  Net 370 ml   Filed Weights   12/30/22 1546 01/01/23 0500  Weight: 88.5 kg 96.3 kg    Scheduled Meds:  amLODipine  5 mg Oral Daily   divalproex  750 mg Oral BID   gabapentin  300 mg Oral QHS   metoprolol succinate  25 mg Oral Daily   sodium chloride flush  3 mL Intravenous Q12H   Continuous Infusions:  heparin 1,450 Units/hr (12/31/22 1855)   piperacillin-tazobactam 3.375 g (01/01/23 0528)    Nutritional status     Body mass index is 29.61 kg/m.  Data Reviewed:   CBC: Recent Labs  Lab 12/27/22 0946 12/30/22 1600 12/31/22 0705  WBC 11.5* 5.9 9.5  NEUTROABS 7.8*  --   --   HGB 12.7* 12.2* 13.3  HCT 39.3 36.6* 40.3  MCV 101.3* 98.9 99.0  PLT 296.0 290 334   Basic Metabolic Panel: Recent Labs  Lab 12/27/22 0946 12/30/22 1600 12/31/22 0705  NA 132* 134* 133*  K 4.4 4.3 4.2  CL 95* 99 98  CO2 29 26 24   GLUCOSE 116* 131* 102*  BUN 31* 29* 21  CREATININE 1.08 0.87 1.00  CALCIUM 9.0 9.3 8.9    GFR: Estimated Creatinine Clearance: 72.1 mL/min (by C-G formula based on SCr of 1 mg/dL). Liver Function Tests: Recent Labs  Lab 12/27/22 0946 12/30/22 1600 12/31/22 0705  AST 28 87* 95*  ALT 50 90* 100*  ALKPHOS 362* 511* 488*  BILITOT 1.0 0.8 1.1  PROT 6.8 6.5 6.6  ALBUMIN 3.5 3.5 2.6*   Recent Labs  Lab 12/30/22 1600  LIPASE 49   No results for input(s): "AMMONIA" in the last 168 hours. Coagulation Profile: Recent Labs  Lab 12/30/22 2151  INR 1.3*   Cardiac Enzymes: No results for input(s): "CKTOTAL", "CKMB", "CKMBINDEX", "TROPONINI" in the last 168 hours. BNP (last 3 results) No results for input(s): "PROBNP" in the last 8760 hours. HbA1C: No results for input(s): "HGBA1C" in the last 72 hours. CBG: No results for input(s): "GLUCAP" in the last 168 hours. Lipid Profile: No results for input(s): "CHOL", "HDL", "LDLCALC", "TRIG", "CHOLHDL", "LDLDIRECT" in the last 72 hours. Thyroid Function Tests: No results for input(s): "TSH", "T4TOTAL", "FREET4", "T3FREE", "THYROIDAB" in the last 72 hours. Anemia Panel: No results for input(s): "VITAMINB12", "FOLATE", "FERRITIN", "TIBC", "IRON", "RETICCTPCT" in the last 72 hours. Sepsis Labs: No results for input(s): "PROCALCITON", "LATICACIDVEN" in the last 168 hours.  Recent Results (from the past 240 hour(s))  Culture, blood (Routine X 2) w Reflex to ID Panel     Status: None (Preliminary result)   Collection Time: 12/30/22  9:51 PM   Specimen: BLOOD LEFT ARM  Result Value Ref Range Status   Specimen Description BLOOD LEFT ARM  Final   Special Requests   Final    BOTTLES DRAWN AEROBIC AND ANAEROBIC Blood Culture results may not be optimal due to an excessive volume of blood received in culture bottles   Culture   Final    NO GROWTH 2 DAYS Performed at [facility], [address], [place], [place] [address]    Report Status PENDING  Incomplete  Culture, blood (Routine X 2) w Reflex to ID Panel     Status:  None (Preliminary result)   Collection Time: 12/30/22  9:51 PM   Specimen: BLOOD LEFT ARM  Result Value Ref Range Status   Specimen Description BLOOD LEFT ARM  Final   Special Requests   Final    BOTTLES DRAWN AEROBIC AND ANAEROBIC Blood Culture adequate volume   Culture   Final    NO GROWTH 2 DAYS Performed at [facility], [address], [place], [place] [address]    Report Status PENDING  Incomplete  Surgical PCR screen     Status: None   Collection Time: 12/31/22  5:00 AM   Specimen: Nasal  Mucosa; Nasal Swab  Result Value Ref Range Status   MRSA, PCR NEGATIVE NEGATIVE Final   Staphylococcus aureus NEGATIVE NEGATIVE Final    Comment: (NOTE) The Xpert SA Assay (FDA approved for NASAL specimens in patients 67 years of age and older), is one component of a comprehensive surveillance program. It is not intended to diagnose infection nor to guide or monitor treatment. Performed at [facility], [address], [place], [place] [address]          Radiology Studies: No results found.         LOS: 2 days   Time spent= 35 mins    Miguel Rota, MD [facility]  If 7PM-7AM, please contact night-coverage  01/01/2023, 11:45 AM

## 2023-01-01 NOTE — Hospital Course (Addendum)
  Brief Narrative:  77 year old with history of MR status post annuloplasty, A-flutter/A-fib on Eliquis, HTN, seizures comes to the ED with abdominal pain, nausea and anorexia.  Workup showed acute cholecystitis with CBD dilation.  Also noted to have transaminitis.  Patient started on broad-spectrum antibiotics, general surgery and IR were consulted.  IR planning percutaneous drain on 10/31.  Patient tolerated the procedure well, following day cleared for discharge on oral antibiotics.     Assessment & Plan:  Principal Problem:   Acute cholecystitis Active Problems:   Atrial fibrillation, chronic (HCC)   Hypertension   Seizure (HCC)   Acute cholecystitis with CBD dilation and transaminitis MRI of the abdomen is consistent with acute cholecystitis/Mirizzi syndrome.  LFTs are rising.  Seen by IR and general surgery, right upper quadrant drain placed on 10/21.  Will transition to 4 more days of oral Augmentin.  IR and general surgery to arrange for outpatient follow-up.   History of chronic atrial fibrillation/flutter Continue home Toprol-XL.  Ok to resume eliquis today per IR   Essential hypertension Norvasc 5 mg daily.    History of seizure Continue home Depakote   DVT prophylaxis:   Eliquis Code Status: Full code Family Communication: Wife present at bedside Disposition Plan: Hopefully dc later today   Subjective: Doing ok no complaints.  Tolerated his drain placement on 10/31   Examination:   General exam: Appears calm and comfortable  Respiratory system: Clear to auscultation. Respiratory effort normal. Cardiovascular system: S1 & S2 heard, RRR. No JVD, murmurs, rubs, gallops or clicks. No pedal edema. Gastrointestinal system: Abdomen is nondistended, soft and nontender. No organomegaly or masses felt. Normal bowel sounds heard. Central nervous system: Alert and oriented. No focal neurological deficits. Extremities: Symmetric 5 x 5 power. Skin: No rashes, lesions or  ulcers Psychiatry: Judgement and insight appear normal. Mood & affect appropriate.    RUQ drain in palce

## 2023-01-01 NOTE — Progress Notes (Signed)
PHARMACY - ANTICOAGULATION CONSULT NOTE  Pharmacy Consult for Heparin while Eliquis on hold Indication: atrial fibrillation  Allergies  Allergen Reactions   Other Other (See Comments)    Neosporin Ophthalmic causes eyes to turn red    Patient Measurements: Height: 5\' 11"  (180.3 cm) Weight: 96.3 kg (212 lb 4.9 oz) IBW/kg (Calculated) : 75.3 Heparin Dosing Weight: 88kg  Vital Signs: Temp: 99.2 F (37.3 C) (10/30 2006) Temp Source: Oral (10/30 1602) BP: 122/86 (10/30 2006) Pulse Rate: 93 (10/30 2006)  Labs: Recent Labs    12/30/22 1600 12/30/22 2151 12/30/22 2151 12/31/22 0705 12/31/22 1747 01/01/23 1154 01/01/23 2054  HGB 12.2*  --   --  13.3  --  11.9*  --   HCT 36.6*  --   --  40.3  --  33.9*  --   PLT 290  --   --  334  --  232  --   APTT  --  36   < > 34 61* 43*  --   LABPROT  --  16.7*  --   --   --   --   --   INR  --  1.3*  --   --   --   --   --   HEPARINUNFRC  --  0.96*   < > 0.76*  --  0.20* 0.30  CREATININE 0.87  --   --  1.00  --  1.00  --    < > = values in this interval not displayed.    Estimated Creatinine Clearance: 72.1 mL/min (by C-G formula based on SCr of 1 mg/dL).   Medical History: Past Medical History:  Diagnosis Date   AK (actinic keratosis)    Allergy    Chicken pox    Compression fracture of L1 lumbar vertebra (HCC)    Compression fracture of thoracic vertebra (HCC)    Hypertension    Lentigines    Follows with DR Culton (derma)   Seizures (HCC)       Assessment: 77 yo male on chronic Eliquis for afib who presents with acute cholecystitis. Pharmacy consulted to dose IV heparin while Eliquis on hold for possible surgery. Last dose of Eliquis 10/28 AM, therefore, will utilize aPTT for heparin monitoring as heparin levels will be falsely elevated with recent DOAC use.  10/30 PM: Heparin level 0.3, at low end of goal range on 1600 units/hr. No issues with infusion or bleeding per RN.  Goal of Therapy:  Heparin level 0.3-0.7  units/ml aPTT 66-102 seconds Monitor platelets by anticoagulation protocol: Yes   Plan:   Increase heparin infusion slightly to 1650 units/hr  Monitor daily heparin level, CBC, signs/symptoms of bleeding  *Plan noted for IR percutaneous cholecystostomy drainage on Thursday 10/31. RN aware to hold heparin 6 hrs before surgery, not posted on OR board yet.  Loralee Pacas, PharmD, BCPS 01/01/2023 9:37 PM  Please check AMION for all [facility] Pharmacy phone numbers After 10:00 PM, call Main Pharmacy [phone]

## 2023-01-01 NOTE — Progress Notes (Signed)
PHARMACY - ANTICOAGULATION CONSULT NOTE  Pharmacy Consult for Heparin while Eliquis on hold Indication: atrial fibrillation  Allergies  Allergen Reactions   Other Other (See Comments)    Neosporin Ophthalmic causes eyes to turn red    Patient Measurements: Height: 5\' 11"  (180.3 cm) Weight: 96.3 kg (212 lb 4.9 oz) IBW/kg (Calculated) : 75.3 Heparin Dosing Weight: 88kg  Vital Signs: Temp: 98.1 F (36.7 C) (10/30 0734) Temp Source: Oral (10/30 0734) BP: 114/74 (10/30 1230) Pulse Rate: 80 (10/30 1230)  Labs: Recent Labs    12/30/22 1600 12/30/22 1600 12/30/22 2151 12/31/22 0705 12/31/22 1747 01/01/23 1154  HGB 12.2*  --   --  13.3  --  11.9*  HCT 36.6*  --   --  40.3  --  33.9*  PLT 290  --   --  334  --  232  APTT  --    < > 36 34 61* 43*  LABPROT  --   --  16.7*  --   --   --   INR  --   --  1.3*  --   --   --   HEPARINUNFRC  --   --  0.96* 0.76*  --  0.20*  CREATININE 0.87  --   --  1.00  --  1.00   < > = values in this interval not displayed.    Estimated Creatinine Clearance: 72.1 mL/min (by C-G formula based on SCr of 1 mg/dL).   Medical History: Past Medical History:  Diagnosis Date   AK (actinic keratosis)    Allergy    Chicken pox    Compression fracture of L1 lumbar vertebra (HCC)    Compression fracture of thoracic vertebra (HCC)    Hypertension    Lentigines    Follows with DR Culton (derma)   Seizures (HCC)       Assessment: 77 yo male on chronic Eliquis for afib who presents with acute cholecystitis. Pharmacy consulted to dose IV heparin while Eliquis on hold for possible surgery. Last dose of Eliquis 10/28 AM, therefore, will utilize aPTT for heparin monitoring as heparin levels will be falsely elevated with recent DOAC use.  Labs delayed due to patient refusal this AM. Heparin level 0.2 is correlating with aPTT and are subtherapeutic on 1450 units/hr. Will follow heparin levels from now.  No issues with infusion or bleeding per RN.  *Plan  noted for IR percutaneous cholecystostomy drainage on Thursday 10/31. RN aware to hold heparin 6 hrs before surgery, not posted on OR board yet.   Goal of Therapy:  Heparin level 0.3-0.7 units/ml aPTT 66-102 seconds Monitor platelets by anticoagulation protocol: Yes   Plan:   Increase heparin infusion to 1600 units/hr  F/u 8hr heparin level  Monitor daily heparin level, CBC, signs/symptoms of bleeding  F/u if heparin needs to be held prior to surgery or IR  Alphia Moh, PharmD, BCPS, BCC[facility] Clinical Pharmacist  Please check AMION for all [facility] Pharmacy phone numbers After 10:00 PM, call Main Pharmacy [phone]

## 2023-01-01 NOTE — Plan of Care (Signed)
  Problem: Education: Goal: Knowledge of General Education information will improve Description: Including pain rating scale, medication(s)/side effects and non-pharmacologic comfort measures Outcome: Not Progressing   Problem: Activity: Goal: Risk for activity intolerance will decrease Outcome: Not Progressing   

## 2023-01-01 NOTE — Progress Notes (Signed)
[facility] Progress Note     Subjective: CC:  Denies pain currently. Eager to get his procedure so he can be one step closer to home.   Objective: Vital signs in last 24 hours: Temp:  [98.1 F (36.7 C)-101.1 F (38.4 C)] 98.1 F (36.7 C) (10/30 0734) Pulse Rate:  [94-116] 116 (10/30 1011) Resp:  [16-18] 16 (10/30 0734) BP: (89-145)/(69-82) 106/71 (10/30 1011) SpO2:  [93 %-95 %] 93 % (10/30 0734) Weight:  [96.3 kg] 96.3 kg (10/30 0500) Last BM Date : 12/29/22  Intake/Output from previous day: 10/29 0701 - 10/30 0700 In: 370 [P.O.:120; IV Piggyback:250] Out: 150 [Urine:150] Intake/Output this shift: No intake/output data recorded.  PE: Gen:  Alert, NAD, pleasant Card:  Regular rate and rhythm Pulm:  Normal effort, clear to auscultation bilaterally Abd: Soft, non-tender, non-distended Skin: warm and dry, no rashes  Psych: A&Ox3   Lab Results:  Recent Labs    12/30/22 1600 12/31/22 0705  WBC 5.9 9.5  HGB 12.2* 13.3  HCT 36.6* 40.3  PLT 290 334   BMET Recent Labs    12/30/22 1600 12/31/22 0705  NA 134* 133*  K 4.3 4.2  CL 99 98  CO2 26 24  GLUCOSE 131* 102*  BUN 29* 21  CREATININE 0.87 1.00  CALCIUM 9.3 8.9   PT/INR Recent Labs    12/30/22 2151  LABPROT 16.7*  INR 1.3*   CMP     Component Value Date/Time   NA 133 (L) 12/31/2022 0705   NA 141 05/20/2022 1026   NA 136 02/03/2012 1915   K 4.2 12/31/2022 0705   K 3.8 02/03/2012 1915   CL 98 12/31/2022 0705   CL 102 02/03/2012 1915   CO2 24 12/31/2022 0705   CO2 27 02/03/2012 1915   GLUCOSE 102 (H) 12/31/2022 0705   GLUCOSE 87 02/03/2012 1915   BUN 21 12/31/2022 0705   BUN 24 05/20/2022 1026   BUN 18 02/03/2012 1915   CREATININE 1.00 12/31/2022 0705   CREATININE 0.90 02/03/2012 1915   CALCIUM 8.9 12/31/2022 0705   CALCIUM 8.2 (L) 02/03/2012 1915   PROT 6.6 12/31/2022 0705   PROT 5.9 (L) 05/20/2022 1026   PROT 6.7 02/03/2012 1915   ALBUMIN 2.6 (L) 12/31/2022 0705   ALBUMIN  3.9 05/20/2022 1026   ALBUMIN 3.9 02/03/2012 1915   AST 95 (H) 12/31/2022 0705   AST 31 02/03/2012 1915   ALT 100 (H) 12/31/2022 0705   ALT 31 02/03/2012 1915   ALKPHOS 488 (H) 12/31/2022 0705   ALKPHOS 75 02/03/2012 1915   BILITOT 1.1 12/31/2022 0705   BILITOT 0.4 05/20/2022 1026   BILITOT 0.5 02/03/2012 1915   GFRNONAA >60 12/31/2022 0705   GFRNONAA >60 02/03/2012 1915   GFRAA >60 10/07/2019 1330   GFRAA >60 02/03/2012 1915   Lipase     Component Value Date/Time   LIPASE 49 12/30/2022 1600       Studies/Results: No results found.  Anti-infectives: Anti-infectives (From admission, onward)    Start     Dose/Rate Route Frequency Ordered Stop   12/30/22 2230  piperacillin-tazobactam (ZOSYN) IVPB 3.375 g  Status:  Discontinued        3.375 g 12.5 mL/hr over 240 Minutes Intravenous Every 8 hours 12/30/22 1955 12/30/22 2031   12/30/22 2230  piperacillin-tazobactam (ZOSYN) IVPB 3.375 g        3.375 g 12.5 mL/hr over 240 Minutes Intravenous Every 8 hours 12/30/22 2031 01/06/23 2159   12/30/22 1630  piperacillin-tazobactam (ZOSYN) IVPB 3.375 g        3.375 g 100 mL/hr over 30 Minutes Intravenous  Once 12/30/22 1615 12/30/22 1658        Assessment/Plan  77 y/o M w/ a hx of MVR w/ annuloplasty, Afib on Eliquis (last dose 10/28), HTN, and a seizure disorder who presents with 1 month of abdominal pain imaging c/w acute cholecystitis w/ an adjacent abscess and 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  This care required straight-forward level of medical decision making.   Hosie Spangle, PA-C [facility] Please see Amion for pager number during day hours 7:00am-4:30pm

## 2023-01-02 ENCOUNTER — Inpatient Hospital Stay (HOSPITAL_COMMUNITY): Payer: Medicare Other

## 2023-01-02 DIAGNOSIS — K810 Acute cholecystitis: Secondary | ICD-10-CM | POA: Diagnosis not present

## 2023-01-02 HISTORY — PX: IR PERC CHOLECYSTOSTOMY: IMG2326

## 2023-01-02 LAB — CBC
HCT: 35.2 % — ABNORMAL LOW (ref 39.0–52.0)
Hemoglobin: 11.9 g/dL — ABNORMAL LOW (ref 13.0–17.0)
MCH: 32 pg (ref 26.0–34.0)
MCHC: 33.8 g/dL (ref 30.0–36.0)
MCV: 94.6 fL (ref 80.0–100.0)
Platelets: 250 10*3/uL (ref 150–400)
RBC: 3.72 MIL/uL — ABNORMAL LOW (ref 4.22–5.81)
RDW: 13.2 % (ref 11.5–15.5)
WBC: 6.6 10*3/uL (ref 4.0–10.5)
nRBC: 0 % (ref 0.0–0.2)

## 2023-01-02 LAB — COMPREHENSIVE METABOLIC PANEL
ALT: 47 U/L — ABNORMAL HIGH (ref 0–44)
AST: 41 U/L (ref 15–41)
Albumin: 2 g/dL — ABNORMAL LOW (ref 3.5–5.0)
Alkaline Phosphatase: 285 U/L — ABNORMAL HIGH (ref 38–126)
Anion gap: 10 (ref 5–15)
BUN: 15 mg/dL (ref 8–23)
CO2: 24 mmol/L (ref 22–32)
Calcium: 8 mg/dL — ABNORMAL LOW (ref 8.9–10.3)
Chloride: 94 mmol/L — ABNORMAL LOW (ref 98–111)
Creatinine, Ser: 0.94 mg/dL (ref 0.61–1.24)
GFR, Estimated: >60 mL/min (ref >60)
Glucose, Bld: 98 mg/dL (ref 70–99)
Potassium: 3.7 mmol/L (ref 3.5–5.1)
Sodium: 128 mmol/L — ABNORMAL LOW (ref 135–145)
Total Bilirubin: 0.6 mg/dL (ref 0.3–1.2)
Total Protein: 5.6 g/dL — ABNORMAL LOW (ref 6.5–8.1)

## 2023-01-02 LAB — HEPARIN LEVEL (UNFRACTIONATED): Heparin Unfractionated: 0.18 [IU]/mL — ABNORMAL LOW (ref 0.30–0.70)

## 2023-01-02 LAB — MAGNESIUM: Magnesium: 1.9 mg/dL (ref 1.7–2.4)

## 2023-01-02 MED ORDER — LIDOCAINE HCL 1 % IJ SOLN
INTRAMUSCULAR | Status: AC
  Filled 2023-01-02: qty 20

## 2023-01-02 MED ORDER — MIDAZOLAM HCL 2 MG/2ML IJ SOLN
INTRAMUSCULAR | Status: AC
  Filled 2023-01-02: qty 2

## 2023-01-02 MED ORDER — SODIUM CHLORIDE 0.9% FLUSH
5.0000 mL | Freq: Three times a day (TID) | INTRAVENOUS | Status: DC
  Administered 2023-01-02 – 2023-01-03 (×3): 5 mL

## 2023-01-02 MED ORDER — FENTANYL CITRATE (PF) 100 MCG/2ML IJ SOLN
INTRAMUSCULAR | Status: AC | PRN
  Administered 2023-01-02: 50 ug via INTRAVENOUS

## 2023-01-02 MED ORDER — IOHEXOL 300 MG/ML  SOLN
50.0000 mL | Freq: Once | INTRAMUSCULAR | Status: AC | PRN
  Administered 2023-01-02: 7 mL

## 2023-01-02 MED ORDER — HEPARIN (PORCINE) 25000 UT/250ML-% IV SOLN
1900.0000 [IU]/h | INTRAVENOUS | Status: DC
  Administered 2023-01-02: 1750 [IU]/h via INTRAVENOUS
  Administered 2023-01-03: 1900 [IU]/h via INTRAVENOUS
  Filled 2023-01-02: qty 250

## 2023-01-02 MED ORDER — FENTANYL CITRATE (PF) 100 MCG/2ML IJ SOLN
INTRAMUSCULAR | Status: AC
  Filled 2023-01-02: qty 2

## 2023-01-02 MED ORDER — MIDAZOLAM HCL 2 MG/2ML IJ SOLN
INTRAMUSCULAR | Status: AC | PRN
  Administered 2023-01-02: 1 mg via INTRAVENOUS

## 2023-01-02 NOTE — Progress Notes (Signed)
PHARMACY - ANTICOAGULATION CONSULT NOTE  Pharmacy Consult for Heparin while Eliquis on hold Indication: atrial fibrillation  Allergies  Allergen Reactions   Other Other (See Comments)    Neosporin Ophthalmic causes eyes to turn red    Patient Measurements: Height: 5\' 11"  (180.3 cm) Weight: 88.7 kg (195 lb 9.6 oz) IBW/kg (Calculated) : 75.3 Heparin Dosing Weight: 88kg  Vital Signs: Temp: 97.9 F (36.6 C) (10/31 2021) Temp Source: Oral (10/31 1604) BP: 124/76 (10/31 2021) Pulse Rate: 84 (10/31 2021)  Labs: Recent Labs    12/31/22 0705 12/31/22 1747 01/01/23 1154 01/01/23 2054 01/02/23 1329 01/02/23 2134  HGB 13.3  --  11.9*  --  11.9*  --   HCT 40.3  --  33.9*  --  35.2*  --   PLT 334  --  232  --  250  --   APTT 34 61* 43*  --   --   --   HEPARINUNFRC 0.76*  --  0.20* 0.30  --  0.18*  CREATININE 1.00  --  1.00  --  0.94  --     Estimated Creatinine Clearance: 69 mL/min (by C-G formula based on SCr of 0.94 mg/dL).     Assessment: 77 yo male on chronic Eliquis for afib who presents with acute cholecystitis. Pharmacy consulted to dose IV heparin while Eliquis on hold for possible surgery. Last dose of Eliquis 10/28 AM, therefore, will utilize aPTT for heparin monitoring as heparin levels will be falsely elevated with recent DOAC use.   Last heparin level 0.3 at low end of goal on 1650 units/hr. Heparin held 10/31 @ 0700 for IR procedure. Per IR, ok to restart at 1400.   Heparin level subterapeutic (0.18) on infusion at 1750 units/hr. No issues with line or bleeding reported per RN.  Goal of Therapy:  Heparin level 0.3-0.7 units/ml  Monitor platelets by anticoagulation protocol: Yes   Plan:   Increase heparin infusion to 1900 units/hr  F/u 8hr heparin level   Christoper Fabian, PharmD, BCPS Please see amion for complete clinical pharmacist phone list 01/02/2023 10:41 PM

## 2023-01-02 NOTE — Progress Notes (Signed)
PROGRESS NOTE    Samuel Mayo  YNW:[id number] DOB: 01-12-1946 DOA: 12/30/2022 PCP: [name], [name], MD     Brief Narrative:  77 year old with history of MR status post annuloplasty, A-flutter/A-fib on Eliquis, HTN, seizures comes to the ED with abdominal pain, nausea and anorexia.  Workup showed acute cholecystitis with CBD dilation.  Also noted to have transaminitis.  Patient started on broad-spectrum antibiotics, general surgery and IR were consulted.  IR planning percutaneous drain on 10/31.     Assessment & Plan:  Principal Problem:   Acute cholecystitis Active Problems:   Atrial fibrillation, chronic (HCC)   Hypertension   Seizure (HCC)   Acute cholecystitis with CBD dilation and transaminitis MRI of the abdomen is consistent with acute cholecystitis/Mirizzi syndrome.  LFTs are rising.  General surgery following, continue IV antibiotics.  S/p drain placement 10/31   History of chronic atrial fibrillation/flutter Continue home Toprol-XL.  Ok to resume eliquis tomorrow per IR   Essential hypertension Norvasc 5 mg daily.  IV as needed   History of seizure Continue home Depakote   DVT prophylaxis:   heparin drip Code Status: Full code Family Communication: Wife present at bedside Disposition Plan: if doing well tomorrow, IR and gen sx ok with dc tomorrow.    Subjective: Doing ok no complaints.  Tolerated his drain placement this morning   Examination:   General exam: Appears calm and comfortable  Respiratory system: Clear to auscultation. Respiratory effort normal. Cardiovascular system: S1 & S2 heard, RRR. No JVD, murmurs, rubs, gallops or clicks. No pedal edema. Gastrointestinal system: Abdomen is nondistended, soft and nontender. No organomegaly or masses felt. Normal bowel sounds heard. Central nervous system: Alert and oriented. No focal neurological deficits. Extremities: Symmetric 5 x 5 power. Skin: No rashes, lesions or ulcers Psychiatry: Judgement and  insight appear normal. Mood & affect appropriate.    RUQ drain in palce          Diet Orders (From admission, onward)     Start     Ordered   01/02/23 1227  Diet Heart Room service appropriate? Yes; Fluid consistency: Thin  Diet effective now       Question Answer Comment  Room service appropriate? Yes   Fluid consistency: Thin      01/02/23 1226            Objective: Vitals:   01/02/23 1020 01/02/23 1025 01/02/23 1137 01/02/23 1238  BP: 123/72 119/87 131/88 131/88  Pulse: 86 91 76 76  Resp: 17 13 18    Temp:   98.9 F (37.2 C)   TempSrc:   Oral   SpO2: 94% 95% 95%   Weight:      Height:        Intake/Output Summary (Last 24 hours) at 01/02/2023 1305 Last data filed at 01/02/2023 0851 Gross per 24 hour  Intake 565.83 ml  Output 175 ml  Net 390.83 ml   Filed Weights   12/30/22 1546 01/01/23 0500 01/02/23 0350  Weight: 88.5 kg 96.3 kg 88.7 kg    Scheduled Meds:  amLODipine  5 mg Oral Daily   divalproex  750 mg Oral BID   feeding supplement  237 mL Oral BID BM   gabapentin  300 mg Oral QHS   metoprolol succinate  25 mg Oral Daily   sodium chloride flush  3 mL Intravenous Q12H   sodium chloride flush  5 mL Intracatheter Q8H   Continuous Infusions:  heparin     piperacillin-tazobactam 3.375 g (01/02/23  4696)    Nutritional status     Body mass index is 27.28 kg/m.  Data Reviewed:   CBC: Recent Labs  Lab 12/27/22 0946 12/30/22 1600 12/31/22 0705 01/01/23 1154  WBC 11.5* 5.9 9.5 7.7  NEUTROABS 7.8*  --   --   --   HGB 12.7* 12.2* 13.3 11.9*  HCT 39.3 36.6* 40.3 33.9*  MCV 101.3* 98.9 99.0 96.3  PLT 296.0 290 334 232   Basic Metabolic Panel: Recent Labs  Lab 12/27/22 0946 12/30/22 1600 12/31/22 0705 01/01/23 1154  NA 132* 134* 133* 129*  K 4.4 4.3 4.2 3.6  CL 95* 99 98 95*  CO2 29 26 24  20*  GLUCOSE 116* 131* 102* 94  BUN 31* 29* 21 14  CREATININE 1.08 0.87 1.00 1.00  CALCIUM 9.0 9.3 8.9 7.8*  MG  --   --   --  1.8  PHOS   --   --   --  3.3   GFR: Estimated Creatinine Clearance: 64.8 mL/min (by C-G formula based on SCr of 1 mg/dL). Liver Function Tests: Recent Labs  Lab 12/27/22 0946 12/30/22 1600 12/31/22 0705 01/01/23 1154  AST 28 87* 95* 43*  ALT 50 90* 100* 51*  ALKPHOS 362* 511* 488* 308*  BILITOT 1.0 0.8 1.1 0.6  PROT 6.8 6.5 6.6 5.2*  ALBUMIN 3.5 3.5 2.6* 2.0*   Recent Labs  Lab 12/30/22 1600  LIPASE 49   No results for input(s): "AMMONIA" in the last 168 hours. Coagulation Profile: Recent Labs  Lab 12/30/22 2151  INR 1.3*   Cardiac Enzymes: No results for input(s): "CKTOTAL", "CKMB", "CKMBINDEX", "TROPONINI" in the last 168 hours. BNP (last 3 results) No results for input(s): "PROBNP" in the last 8760 hours. HbA1C: No results for input(s): "HGBA1C" in the last 72 hours. CBG: No results for input(s): "GLUCAP" in the last 168 hours. Lipid Profile: No results for input(s): "CHOL", "HDL", "LDLCALC", "TRIG", "CHOLHDL", "LDLDIRECT" in the last 72 hours. Thyroid Function Tests: No results for input(s): "TSH", "T4TOTAL", "FREET4", "T3FREE", "THYROIDAB" in the last 72 hours. Anemia Panel: No results for input(s): "VITAMINB12", "FOLATE", "FERRITIN", "TIBC", "IRON", "RETICCTPCT" in the last 72 hours. Sepsis Labs: No results for input(s): "PROCALCITON", "LATICACIDVEN" in the last 168 hours.  Recent Results (from the past 240 hour(s))  Culture, blood (Routine X 2) w Reflex to ID Panel     Status: None (Preliminary result)   Collection Time: 12/30/22  9:51 PM   Specimen: BLOOD LEFT ARM  Result Value Ref Range Status   Specimen Description BLOOD LEFT ARM  Final   Special Requests   Final    BOTTLES DRAWN AEROBIC AND ANAEROBIC Blood Culture results may not be optimal due to an excessive volume of blood received in culture bottles   Culture   Final    NO GROWTH 3 DAYS Performed at [facility], [address] [place], [place] [address]    Report Status PENDING  Incomplete   Culture, blood (Routine X 2) w Reflex to ID Panel     Status: None (Preliminary result)   Collection Time: 12/30/22  9:51 PM   Specimen: BLOOD LEFT ARM  Result Value Ref Range Status   Specimen Description BLOOD LEFT ARM  Final   Special Requests   Final    BOTTLES DRAWN AEROBIC AND ANAEROBIC Blood Culture adequate volume   Culture   Final    NO GROWTH 3 DAYS Performed at [facility], [address], [place],  [place] [address]    Report Status PENDING  Incomplete  Surgical PCR screen     Status: None   Collection Time: 12/31/22  5:00 AM   Specimen: Nasal Mucosa; Nasal Swab  Result Value Ref Range Status   MRSA, PCR NEGATIVE NEGATIVE Final   Staphylococcus aureus NEGATIVE NEGATIVE Final    Comment: (NOTE) The Xpert SA Assay (FDA approved for NASAL specimens in patients 4 years of age and older), is one component of a comprehensive surveillance program. It is not intended to diagnose infection nor to guide or monitor treatment. Performed at [facility], [address] [place], [place] [address]          Radiology Studies: IR Perc Cholecystostomy  Result Date: 01/02/2023 CLINICAL DATA:  Acute cholecystitis and need for percutaneous cholecystostomy tube due to current operative risks. EXAM: PERCUTANEOUS CHOLECYSTOSTOMY TUBE PLACEMENT COMPARISON:  Ultrasound of the right upper quadrant on 12/26/2022 and MRI of the abdomen on 12/29/2022 ANESTHESIA/SEDATION: Moderate (conscious) sedation was employed during this procedure. A total of Versed 1.0 mg and Fentanyl 50 mcg was administered intravenously. Moderate Sedation Time: 16 minutes. The patient's level of consciousness and vital signs were monitored continuously by radiology nursing throughout the procedure under my direct supervision. CONTRAST:  7mL OMNIPAQUE IOHEXOL 300 MG/ML  SOLN MEDICATIONS: No additional medications. The patient is receiving IV antibiotics in the hospital. FLUOROSCOPY TIME:  54 seconds.  6.0 mGy.  PROCEDURE: The procedure, risks, benefits, and alternatives were explained to the patient. Questions regarding the procedure were encouraged and answered. The patient understands and consents to the procedure. A time-out was performed prior to initiating the procedure. The right abdominal wall was prepped with chlorhexidine in a sterile fashion, and a sterile drape was applied covering the operative field. A sterile gown and sterile gloves were used for the procedure. Local anesthesia was provided with 1% Lidocaine. Ultrasound image documentation was performed. Fluoroscopy during the procedure and fluoro spot radiograph confirms appropriate catheter position. Ultrasound was utilized to localize the gallbladder. Under direct ultrasound guidance, a 21 gauge needle was advanced via a transhepatic approach into the gallbladder lumen. Aspiration was performed and a bile sample sent for culture studies. A small amount of diluted contrast material was injected. A guide wire was then advanced into the gallbladder. A transitional dilator was placed. Percutaneous tract dilatation was then performed over a guide wire to 10-French. A 10-French pigtail drainage catheter was then advanced into the gallbladder lumen under fluoroscopy. Catheter was formed and injected with contrast material to confirm position. The catheter was flushed and connected to a gravity drainage bag. It was secured at the skin with a Prolene retention suture and Stat-Lock device. COMPLICATIONS: None FINDINGS: After needle puncture of the gallbladder, there was return of purulent bile. A bile sample was aspirated and sent for culture. The cholecystostomy tube was advanced into the gallbladder lumen and formed. It is now draining purulent fluid. This tube will be left to gravity drainage. IMPRESSION: Percutaneous cholecystostomy with placement of 10-French drainage catheter into the gallbladder lumen. This was left to gravity drainage. There is return of  purulent fluid from the gallbladder lumen. Electronically Signed   By: Irish Lack M.D.   On: 01/02/2023 11:19           LOS: 3 days   Time spent= 35 mins    Miguel Rota, MD [facility]  If 7PM-7AM, please contact night-coverage  01/02/2023, 1:05 PM

## 2023-01-02 NOTE — Procedures (Signed)
Interventional Radiology Procedure Note  Procedure: Percutaneous cholecystostomy tube placement  Complications: None  Estimated Blood Loss: None  Findings: Gallbladder contains purulent bile. Sample sent for culture. 10 Fr perc choly tube formed in GB and placed to gravity bag drainage.  [name]. Fredia Sorrow, M.D Pager:  [phone]

## 2023-01-02 NOTE — Progress Notes (Signed)
PHARMACY - ANTICOAGULATION CONSULT NOTE  Pharmacy Consult for Heparin while Eliquis on hold Indication: atrial fibrillation  Allergies  Allergen Reactions   Other Other (See Comments)    Neosporin Ophthalmic causes eyes to turn red    Patient Measurements: Height: 5\' 11"  (180.3 cm) Weight: 88.7 kg (195 lb 9.6 oz) IBW/kg (Calculated) : 75.3 Heparin Dosing Weight: 88kg  Vital Signs: Temp: 99.4 F (37.4 C) (10/31 0348) Temp Source: Oral (10/31 0348) BP: 129/88 (10/31 0348) Pulse Rate: 92 (10/31 0348)  Labs: Recent Labs    12/30/22 1600 12/30/22 2151 12/30/22 2151 12/31/22 0705 12/31/22 1747 01/01/23 1154 01/01/23 2054  HGB 12.2*  --   --  13.3  --  11.9*  --   HCT 36.6*  --   --  40.3  --  33.9*  --   PLT 290  --   --  334  --  232  --   APTT  --  36   < > 34 61* 43*  --   LABPROT  --  16.7*  --   --   --   --   --   INR  --  1.3*  --   --   --   --   --   HEPARINUNFRC  --  0.96*   < > 0.76*  --  0.20* 0.30  CREATININE 0.87  --   --  1.00  --  1.00  --    < > = values in this interval not displayed.    Estimated Creatinine Clearance: 64.8 mL/min (by C-G formula based on SCr of 1 mg/dL).   Medical History: Past Medical History:  Diagnosis Date   AK (actinic keratosis)    Allergy    Chicken pox    Compression fracture of L1 lumbar vertebra (HCC)    Compression fracture of thoracic vertebra (HCC)    Hypertension    Lentigines    Follows with DR Culton (derma)   Seizures (HCC)       Assessment: 77 yo male on chronic Eliquis for afib who presents with acute cholecystitis. Pharmacy consulted to dose IV heparin while Eliquis on hold for possible surgery. Last dose of Eliquis 10/28 AM, therefore, will utilize aPTT for heparin monitoring as heparin levels will be falsely elevated with recent DOAC use.   Last heparin level 0.3 at low end of goal on 1650 units/hr. Heparin held 10/31 @ 0700 for IR procedure. Per IR, ok to restart at 1400.   Goal of Therapy:   Heparin level 0.3-0.7 units/ml  Monitor platelets by anticoagulation protocol: Yes   Plan:   Restart heparin infusion at 1750 units/hr at 1400  F/u 8hr heparin level  Monitor daily heparin level, CBC, signs/symptoms of bleeding     Alphia Moh, PharmD, BCPS, [facility] Clinical Pharmacist  Please check AMION for all [facility] Pharmacy phone numbers After 10:00 PM, call Main Pharmacy [phone]

## 2023-01-02 NOTE — Plan of Care (Signed)

## 2023-01-02 NOTE — Plan of Care (Signed)
  Problem: Education: Goal: Knowledge of General Education information will improve Description: Including pain rating scale, medication(s)/side effects and non-pharmacologic comfort measures Outcome: Progressing   Problem: Health Behavior/Discharge Planning: Goal: Ability to manage health-related needs will improve Outcome: Progressing   Problem: Activity: Goal: Risk for activity intolerance will decrease Outcome: Progressing   Problem: Pain Management: Goal: General experience of comfort will improve Outcome: Progressing

## 2023-01-02 NOTE — Progress Notes (Signed)
   01/02/23 1428  Mobility  Activity Ambulated with assistance in hallway  Level of Assistance Contact guard assist, steadying assist  Assistive Device Front wheel walker  Distance Ambulated (ft) 225 ft  Activity Response Tolerated fair  Mobility Referral Yes  $Mobility charge 1 Mobility  Mobility Specialist Start Time (ACUTE ONLY) 1411  Mobility Specialist Stop Time (ACUTE ONLY) 1428  Mobility Specialist Time Calculation (min) (ACUTE ONLY) 17 min   Mobility Specialist: Progress Note  Post-Mobility: HR 93, SpO2 94  Pt agreeable to mobility session - received in chair. Required CG using RW (for coordination and stability). C/o feeling "clumsy and uncoordinated." Returned to chair with all needs met - call bell within reach.  Family present.   Barnie Mort, BS Mobility Specialist Please contact via SecureChat or Rehab office at [phone].

## 2023-01-03 ENCOUNTER — Other Ambulatory Visit (HOSPITAL_COMMUNITY): Payer: Self-pay

## 2023-01-03 DIAGNOSIS — K810 Acute cholecystitis: Secondary | ICD-10-CM | POA: Diagnosis not present

## 2023-01-03 LAB — COMPREHENSIVE METABOLIC PANEL
ALT: 43 U/L (ref 0–44)
AST: 44 U/L — ABNORMAL HIGH (ref 15–41)
Albumin: 1.8 g/dL — ABNORMAL LOW (ref 3.5–5.0)
Alkaline Phosphatase: 264 U/L — ABNORMAL HIGH (ref 38–126)
Anion gap: 8 (ref 5–15)
BUN: 17 mg/dL (ref 8–23)
CO2: 23 mmol/L (ref 22–32)
Calcium: 7.6 mg/dL — ABNORMAL LOW (ref 8.9–10.3)
Chloride: 98 mmol/L (ref 98–111)
Creatinine, Ser: 0.83 mg/dL (ref 0.61–1.24)
GFR, Estimated: >60 mL/min (ref >60)
Glucose, Bld: 97 mg/dL (ref 70–99)
Potassium: 3.7 mmol/L (ref 3.5–5.1)
Sodium: 129 mmol/L — ABNORMAL LOW (ref 135–145)
Total Bilirubin: 0.7 mg/dL (ref 0.3–1.2)
Total Protein: 4.9 g/dL — ABNORMAL LOW (ref 6.5–8.1)

## 2023-01-03 LAB — CBC
HCT: 32.4 % — ABNORMAL LOW (ref 39.0–52.0)
Hemoglobin: 11.2 g/dL — ABNORMAL LOW (ref 13.0–17.0)
MCH: 32.7 pg (ref 26.0–34.0)
MCHC: 34.6 g/dL (ref 30.0–36.0)
MCV: 94.7 fL (ref 80.0–100.0)
Platelets: 215 10*3/uL (ref 150–400)
RBC: 3.42 MIL/uL — ABNORMAL LOW (ref 4.22–5.81)
RDW: 13.2 % (ref 11.5–15.5)
WBC: 5.1 10*3/uL (ref 4.0–10.5)
nRBC: 0 % (ref 0.0–0.2)

## 2023-01-03 LAB — MAGNESIUM: Magnesium: 2 mg/dL (ref 1.7–2.4)

## 2023-01-03 LAB — HEPARIN LEVEL (UNFRACTIONATED): Heparin Unfractionated: 0.25 [IU]/mL — ABNORMAL LOW (ref 0.30–0.70)

## 2023-01-03 MED ORDER — AMOXICILLIN-POT CLAVULANATE 875-125 MG PO TABS
1.0000 | ORAL_TABLET | Freq: Two times a day (BID) | ORAL | 0 refills | Status: AC
  Filled 2023-01-03: qty 8, 4d supply, fill #0

## 2023-01-03 MED ORDER — APIXABAN 5 MG PO TABS
5.0000 mg | ORAL_TABLET | Freq: Two times a day (BID) | ORAL | Status: DC
  Administered 2023-01-03: 5 mg via ORAL
  Filled 2023-01-03: qty 1

## 2023-01-03 MED ORDER — VITAMIN D3 50 MCG (2000 UT) PO TABS: 2000.0000 [IU] | ORAL_TABLET | Freq: Every evening | ORAL | Status: DC

## 2023-01-03 MED ORDER — SODIUM CHLORIDE FLUSH 0.9 % IV SOLN
10.0000 mL | INTRAVENOUS | 3 refills | Status: DC | PRN
  Filled 2023-01-03: qty 300, 30d supply, fill #0

## 2023-01-03 NOTE — Progress Notes (Signed)
Jinger Neighbors to be D/C'd  per MD order.  Discussed with the patient and all questions fully answered. Significant other Tammy Sours has been taught to flush and emptying of the drain.  VSS, Skin clean, dry and intact without evidence of skin break down, no evidence of skin tears noted.  IV catheter discontinued intact. Site without signs and symptoms of complications. Dressing and pressure applied.  An After Visit Summary was printed and given to the patient. Patient received prescription from [facility] pharmacy.  D/c education completed with patient/family including follow up instructions, medication list, d/c activities limitations if indicated, with other d/c instructions as indicated by MD - patient able to verbalize understanding, all questions fully answered.   Patient instructed to return to ED, call 911, or call MD for any changes in condition.   Patient to be escorted via WC, and D/C home via private auto.

## 2023-01-03 NOTE — Progress Notes (Signed)
Referring Physician(s): Dr. Bedelia Person  Supervising Physician: Gilmer Mor  Patient Status:  [facility] - In-pt  Chief Complaint: Acute cholecystitis s/p percutaneous cholecystostomy in IR 01/02/23  Subjective: Patient with discharge orders. Patient sitting up in the chair fully dressed with his wife at the bedside. He denies pain or discomfort.   Allergies: Other  Medications: Prior to Admission medications   Medication Sig Start Date End Date Taking? Authorizing Provider  amLODipine (NORVASC) 5 MG tablet TAKE 1 TABLET (5 MG TOTAL) BY MOUTH DAILY. 01/11/22  Yes [name], [name], MD  amoxicillin-clavulanate (AUGMENTIN) 875-125 MG tablet Take 1 tablet by mouth 2 (two) times daily for 4 days. 01/03/23 01/07/23 Yes Amin, Ankit C, MD  beta carotene w/minerals (OCUVITE) tablet Take 1 tablet by mouth every evening.   Yes [provider]  divalproex (DEPAKOTE) 250 MG DR tablet Take 1 tablet (250 mg total) by mouth 2 (two) times daily. Take in addition to 500 mg twice a day (pt should have a total of 750 mg twice a day) 08/26/22  Yes Micki Riley, MD  divalproex (DEPAKOTE) 500 MG DR tablet Take 1 tablet (500 mg total) by mouth 2 (two) times daily. 05/20/22  Yes Micki Riley, MD  ELIQUIS 5 MG TABS tablet TAKE 1 TABLET BY MOUTH TWICE A DAY 08/07/22  Yes Mealor, Roberts Gaudy, MD  gabapentin (NEURONTIN) 300 MG capsule Take 1 capsule (300 mg total) by mouth at bedtime. 09/16/22  Yes Micki Riley, MD  Homeopathic Products (ARNICARE) GEL Apply 1 Application topically daily as needed (For pain). On Ankle   Yes [provider]/  ibandronate (BONIVA) 150 MG tablet TAKE 1 TABLET BY MOUTH EVERY 30 DAYS. TAKE IN AM WITH FULL GLASS OF WATER ON EMPTY STOMACH AND DON'T TAKE ANYTHING ELSE BY MOUTH OR LIE DOWN FOR THE NEXT 30 MINUTES 07/22/22  Yes [name], [name], MD  metoprolol succinate (TOPROL-XL) 25 MG 24 hr tablet TAKE 1 TABLET (25 MG TOTAL) BY MOUTH DAILY. 09/23/22  Yes BranchAlben Spittle, MD  Multiple  Vitamin (MULTIVITAMIN WITH MINERALS) TABS tablet Take 1 tablet by mouth 2 (two) times daily. Vitamin Code Whole Food Multivitamin   Yes [provider]  sodium chloride flush 0.9 % SOLN injection 10 mLs by Intracatheter route as needed. Flush percutaneous cholecystostomy drain with 10 ml normal saline once daily. 01/03/23 02/02/23 Yes Yomaris Palecek, Arman Filter, NP  Cholecalciferol (VITAMIN D3) 50 MCG (2000 UT) TABS Take 1 tablet (2,000 Units total) by mouth every evening. 01/03/23   Amin, Doreen Salvage, MD     Vital Signs: BP 117/75 (BP Location: Left Arm)   Pulse 90   Temp 98.1 F (36.7 C) (Oral)   Resp 18   Ht 5\' 11"  (1.803 m)   Wt 201 lb 4.8 oz (91.3 kg)   SpO2 99%   BMI 28.08 kg/m   Physical Exam Constitutional:      General: He is not in acute distress.    Appearance: He is not ill-appearing.  HENT:     Mouth/Throat:     Mouth: Mucous membranes are moist.     Pharynx: Oropharynx is clear.  Pulmonary:     Effort: Pulmonary effort is normal.  Abdominal:     Palpations: Abdomen is soft.     Tenderness: There is no abdominal tenderness.     Comments: RUQ drain to gravity. Approximately 25 ml of blood-tinged fluid in bag. Dressing is clean/dry. Suture and stat-lock in place. Drain easily flushed  with 5 ml normal saline.   Skin:    General: Skin is warm and dry.  Neurological:     Mental Status: He is alert and oriented to person, place, and time.     Imaging: IR Perc Cholecystostomy  Result Date: 01/02/2023 CLINICAL DATA:  Acute cholecystitis and need for percutaneous cholecystostomy tube due to current operative risks. EXAM: PERCUTANEOUS CHOLECYSTOSTOMY TUBE PLACEMENT COMPARISON:  Ultrasound of the right upper quadrant on 12/26/2022 and MRI of the abdomen on 12/29/2022 ANESTHESIA/SEDATION: Moderate (conscious) sedation was employed during this procedure. A total of Versed 1.0 mg and Fentanyl 50 mcg was administered intravenously. Moderate Sedation Time: 16 minutes. The patient's  level of consciousness and vital signs were monitored continuously by radiology nursing throughout the procedure under my direct supervision. CONTRAST:  7mL OMNIPAQUE IOHEXOL 300 MG/ML  SOLN MEDICATIONS: No additional medications. The patient is receiving IV antibiotics in the hospital. FLUOROSCOPY TIME:  54 seconds.  6.0 mGy. PROCEDURE: The procedure, risks, benefits, and alternatives were explained to the patient. Questions regarding the procedure were encouraged and answered. The patient understands and consents to the procedure. A time-out was performed prior to initiating the procedure. The right abdominal wall was prepped with chlorhexidine in a sterile fashion, and a sterile drape was applied covering the operative field. A sterile gown and sterile gloves were used for the procedure. Local anesthesia was provided with 1% Lidocaine. Ultrasound image documentation was performed. Fluoroscopy during the procedure and fluoro spot radiograph confirms appropriate catheter position. Ultrasound was utilized to localize the gallbladder. Under direct ultrasound guidance, a 21 gauge needle was advanced via a transhepatic approach into the gallbladder lumen. Aspiration was performed and a bile sample sent for culture studies. A small amount of diluted contrast material was injected. A guide wire was then advanced into the gallbladder. A transitional dilator was placed. Percutaneous tract dilatation was then performed over a guide wire to 10-French. A 10-French pigtail drainage catheter was then advanced into the gallbladder lumen under fluoroscopy. Catheter was formed and injected with contrast material to confirm position. The catheter was flushed and connected to a gravity drainage bag. It was secured at the skin with a Prolene retention suture and Stat-Lock device. COMPLICATIONS: None FINDINGS: After needle puncture of the gallbladder, there was return of purulent bile. A bile sample was aspirated and sent for culture.  The cholecystostomy tube was advanced into the gallbladder lumen and formed. It is now draining purulent fluid. This tube will be left to gravity drainage. IMPRESSION: Percutaneous cholecystostomy with placement of 10-French drainage catheter into the gallbladder lumen. This was left to gravity drainage. There is return of purulent fluid from the gallbladder lumen. Electronically Signed   By: Irish Lack M.D.   On: 01/02/2023 11:19    Labs:  CBC: Recent Labs    12/31/22 0705 01/01/23 1154 01/02/23 1329 01/03/23 0807  WBC 9.5 7.7 6.6 5.1  HGB 13.3 11.9* 11.9* 11.2*  HCT 40.3 33.9* 35.2* 32.4*  PLT 334 232 250 215    COAGS: Recent Labs    12/30/22 2151 12/31/22 0705 12/31/22 1747 01/01/23 1154  INR 1.3*  --   --   --   APTT 36 34 61* 43*    BMP: Recent Labs    12/31/22 0705 01/01/23 1154 01/02/23 1329 01/03/23 0807  NA 133* 129* 128* 129*  K 4.2 3.6 3.7 3.7  CL 98 95* 94* 98  CO2 24 20* 24 23  GLUCOSE 102* 94 98 97  BUN 21  14 15 17   CALCIUM 8.9 7.8* 8.0* 7.6*  CREATININE 1.00 1.00 0.94 0.83  GFRNONAA >60 >60 >60 >60    LIVER FUNCTION TESTS: Recent Labs    12/31/22 0705 01/01/23 1154 01/02/23 1329 01/03/23 0807  BILITOT 1.1 0.6 0.6 0.7  AST 95* 43* 41 44*  ALT 100* 51* 47* 43  ALKPHOS 488* 308* 285* 264*  PROT 6.6 5.2* 5.6* 4.9*  ALBUMIN 2.6* 2.0* 2.0* 1.8*    Assessment and Plan:  Acute cholecystitis s/p percutaneous cholecystostomy in IR 01/02/23  Patient is doing well this morning and has been discharged. He understands drain care and follow up instructions.   Drain Location: RUQ Size: Fr size: 10 Fr Date of placement: 01/02/23  Currently to: Drain collection device: gravity 24 hour output:  Output by Drain (mL) 01/01/23 0700 - 01/01/23 1459 01/01/23 1500 - 01/01/23 2259 01/01/23 2300 - 01/02/23 0659 01/02/23 0700 - 01/02/23 1459 01/02/23 1500 - 01/02/23 2259 01/02/23 2300 - 01/03/23 0659 01/03/23 0700 - 01/03/23 1258  Biliary Tube Cook  slip-coat 10.2 Fr. RUQ     200 100 50    Interval imaging/drain manipulation:  None  Current examination: Flushes/aspirates easily.  Insertion site unremarkable. Suture and stat lock in place. Dressed appropriately.   Plan: Continue TID flushes with 5 cc NS. Record output Q shift. Dressing changes QD or PRN if soiled.  Call IR APP or on call IR MD if difficulty flushing or sudden change in drain output.  Repeat imaging/possible drain injection once output < 10 mL/QD (excluding flush material). Consideration for drain removal if output is < 10 mL/QD (excluding flush material), pending discussion with the providing surgical service.  Discharge planning: Percutaneous cholecystostomy drain to remain in place at least 6 weeks. Recommend fluoroscopy with injection of the drain in IR to evaluate for patency of the cystic duct. An order has been placed for a scheduler from our office to call patient with a date/time of his appointment. A prescription for saline flushes has been sent to the [facility]. Drain care teaching performed at the bedside. Patient aware to flush the drain once daily, keep a daily log of the output and to change the dressing daily or as needed. Patient aware the drain site can't get wet and that is must be kept clean and dry to decrease the risk of infection. Patient knows he can shower but the site must be covered with an occlusive dressing.  If the duct is patent and general surgery feels patient is stable for cholecystectomy, the drain would be removed at time of surgery. If the duct is patent and general surgery feels patient is NEVER a candidate for cholecystectomy, drain can be capped for a trial. If symptoms recur, then place to gravity bag again. If trial is successful, discuss possible removal of the drain.  IR will continue to follow - please call with questions or concerns.   Electronically Signed: Alwyn Ren,  AGACNP-BC [phone] 01/03/2023, 12:58 PM   I spent a total of 15 Minutes at the the patient's bedside AND on the patient's hospital floor or unit, greater than 50% of which was counseling/coordinating care for percutaneous cholecystostomy

## 2023-01-03 NOTE — Progress Notes (Signed)
   01/03/23 1330  TOC Brief Assessment  Insurance and Status Reviewed  Patient has primary care physician Yes  Home environment has been reviewed yes  Prior level of function: independent  Prior/Current Home Services No current home services  Social Determinants of Health Reivew SDOH reviewed no interventions necessary  Readmission risk has been reviewed Yes  Transition of care needs no transition of care needs at this time   Nurse provided drain care education. No TOC needs identified

## 2023-01-03 NOTE — Progress Notes (Signed)
PHARMACY - ANTICOAGULATION CONSULT NOTE  Pharmacy Consult: Transition Heparin back to Eliquis Indication: atrial fibrillation  Allergies  Allergen Reactions   Other Other (See Comments)    Neosporin Ophthalmic causes eyes to turn red    Patient Measurements: Height: 5\' 11"  (180.3 cm) Weight: 91.3 kg (201 lb 4.8 oz) IBW/kg (Calculated) : 75.3  Vital Signs: Temp: 97.9 F (36.6 C) (11/01 0837) Temp Source: Oral (11/01 0837) BP: 120/88 (11/01 0837) Pulse Rate: 84 (11/01 0837)  Labs: Recent Labs    12/31/22 1747 01/01/23 1154 01/01/23 1154 01/01/23 2054 01/02/23 1329 01/02/23 2134 01/03/23 0807  HGB  --  11.9*   < >  --  11.9*  --  11.2*  HCT  --  33.9*  --   --  35.2*  --  32.4*  PLT  --  232  --   --  250  --  215  APTT 61* 43*  --   --   --   --   --   HEPARINUNFRC  --  0.20*   < > 0.30  --  0.18* 0.25*  CREATININE  --  1.00  --   --  0.94  --   --    < > = values in this interval not displayed.    Estimated Creatinine Clearance: 74.8 mL/min (by C-G formula based on SCr of 0.94 mg/dL).   Assessment: 77 yo male on chronic Eliquis for afib and history of MVR with annuloplasty who presented with acute cholecystitis and pharmacy was consulted to dose IV heparin while Eliquis was on hold for possible surgery. Last dose of Eliquis 10/28 AM. Patient had percutaneous drain placed in IR on 10/31. Heparin was resumed 10/31 PM. No indications for urgent surgery at this time per surgery team. Pharmacy consulted 11/1 to resume Apixaban.    Heparin level this AM was slightly low at 0.25. Hgb 11.2, Hct 32.4, Platelets 215. No bleeding noted. IR perc chole drain - output.   Goal of Therapy:  Monitor platelets by anticoagulation protocol: Yes   Plan:   Restart Apixaban 5mg  po BID.  Stop IV Heparin at the same time that the first dose of Apixaban is given.  Discontinue Heparin levels.  Monitor CBC and for signs/symptoms of bleeding.   Link Snuffer, PharmD, BCPS,  BCCCP Please refer to [facility] for [facility] numbers 01/03/2023 9:03 AM

## 2023-01-03 NOTE — Progress Notes (Signed)
Trauma/Critical Care Follow Up Note  Subjective:    Overnight Issues:   Objective:  Vital signs for last 24 hours: Temp:  [97.9 F (36.6 C)-99.2 F (37.3 C)] 97.9 F (36.6 C) (11/01 0837) Pulse Rate:  [76-91] 84 (11/01 0837) Resp:  [13-19] 19 (11/01 0837) BP: (112-131)/(71-88) 120/88 (11/01 0837) SpO2:  [93 %-96 %] 95 % (11/01 0837) Weight:  [91.3 kg] 91.3 kg (11/01 0452)  Hemodynamic parameters for last 24 hours:    Intake/Output from previous day: 10/31 0701 - 11/01 0700 In: 158.8 [I.V.:59.5; IV Piggyback:99.3] Out: 995 [Urine:470; Drains:300; Stool:225]  Intake/Output this shift: No intake/output data recorded.  Vent settings for last 24 hours:    Physical Exam:  Gen: comfortable, no distress Neuro: follows commands, alert, communicative HEENT: PERRL Neck: supple CV: RRR Pulm: unlabored breathing on RA Abd: soft, NT  , JP SS GU: urine clear and yellow, +spontaneous voids Extr: wwp, no edema  Results for orders placed or performed during the hospital encounter of 12/30/22 (from the past 24 hour(s))  Aerobic/Anaerobic Culture w Gram Stain (surgical/deep wound)     Status: None (Preliminary result)   Collection Time: 01/02/23 10:52 AM   Specimen: Gallbladder; Bile  Result Value Ref Range   Specimen Description GALL BLADDER    Special Requests BILIARY    Gram Stain      ABUNDANT WBC PRESENT,BOTH PMN AND MONONUCLEAR NO ORGANISMS SEEN    Culture      CULTURE REINCUBATED FOR BETTER GROWTH Performed at [facility], [address] [place], [place] [address]    Report Status PENDING   CBC     Status: Abnormal   Collection Time: 01/02/23  1:29 PM  Result Value Ref Range   WBC 6.6 4.0 - 10.5 K/uL   RBC 3.72 (L) 4.22 - 5.81 MIL/uL   Hemoglobin 11.9 (L) 13.0 - 17.0 g/dL   HCT 41.3 (L) 24.4 - 01.0 %   MCV 94.6 80.0 - 100.0 fL   MCH 32.0 26.0 - 34.0 pg   MCHC 33.8 30.0 - 36.0 g/dL   RDW 27.2 53.6 - 64.4 %   Platelets 250 150 - 400 K/uL   nRBC 0.0  0.0 - 0.2 %  Comprehensive metabolic panel     Status: Abnormal   Collection Time: 01/02/23  1:29 PM  Result Value Ref Range   Sodium 128 (L) 135 - 145 mmol/L   Potassium 3.7 3.5 - 5.1 mmol/L   Chloride 94 (L) 98 - 111 mmol/L   CO2 24 22 - 32 mmol/L   Glucose, Bld 98 70 - 99 mg/dL   BUN 15 8 - 23 mg/dL   Creatinine, Ser 0.34 0.61 - 1.24 mg/dL   Calcium 8.0 (L) 8.9 - 10.3 mg/dL   Total Protein 5.6 (L) 6.5 - 8.1 g/dL   Albumin 2.0 (L) 3.5 - 5.0 g/dL   AST 41 15 - 41 U/L   ALT 47 (H) 0 - 44 U/L   Alkaline Phosphatase 285 (H) 38 - 126 U/L   Total Bilirubin 0.6 0.3 - 1.2 mg/dL   GFR, Estimated >74 >25 mL/min   Anion gap 10 5 - 15  Magnesium     Status: None   Collection Time: 01/02/23  1:29 PM  Result Value Ref Range   Magnesium 1.9 1.7 - 2.4 mg/dL  Heparin level (unfractionated)     Status: Abnormal   Collection Time: 01/02/23  9:34 PM  Result Value Ref Range   Heparin Unfractionated  0.18 (L) 0.30 - 0.70 IU/mL  CBC     Status: Abnormal   Collection Time: 01/03/23  8:07 AM  Result Value Ref Range   WBC 5.1 4.0 - 10.5 K/uL   RBC 3.42 (L) 4.22 - 5.81 MIL/uL   Hemoglobin 11.2 (L) 13.0 - 17.0 g/dL   HCT 16.1 (L) 09.6 - 04.5 %   MCV 94.7 80.0 - 100.0 fL   MCH 32.7 26.0 - 34.0 pg   MCHC 34.6 30.0 - 36.0 g/dL   RDW 40.9 81.1 - 91.4 %   Platelets 215 150 - 400 K/uL   nRBC 0.0 0.0 - 0.2 %  Comprehensive metabolic panel     Status: Abnormal   Collection Time: 01/03/23  8:07 AM  Result Value Ref Range   Sodium 129 (L) 135 - 145 mmol/L   Potassium 3.7 3.5 - 5.1 mmol/L   Chloride 98 98 - 111 mmol/L   CO2 23 22 - 32 mmol/L   Glucose, Bld 97 70 - 99 mg/dL   BUN 17 8 - 23 mg/dL   Creatinine, Ser 7.82 0.61 - 1.24 mg/dL   Calcium 7.6 (L) 8.9 - 10.3 mg/dL   Total Protein 4.9 (L) 6.5 - 8.1 g/dL   Albumin 1.8 (L) 3.5 - 5.0 g/dL   AST 44 (H) 15 - 41 U/L   ALT 43 0 - 44 U/L   Alkaline Phosphatase 264 (H) 38 - 126 U/L   Total Bilirubin 0.7 0.3 - 1.2 mg/dL   GFR, Estimated >95 >62 mL/min    Anion gap 8 5 - 15  Magnesium     Status: None   Collection Time: 01/03/23  8:07 AM  Result Value Ref Range   Magnesium 2.0 1.7 - 2.4 mg/dL  Heparin level (unfractionated)     Status: Abnormal   Collection Time: 01/03/23  8:07 AM  Result Value Ref Range   Heparin Unfractionated 0.25 (L) 0.30 - 0.70 IU/mL    Assessment & Plan:  Present on Admission:  Acute cholecystitis  Hypertension  Atrial fibrillation, chronic (HCC)    LOS: 4 days   Additional comments:I reviewed the patient's new clinical lab test results.   and I reviewed the patients new imaging test results.    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 Okay to transition to augmentin at discharge - hep gtt, okay to transition back to DOAC per IR/IMS recs - medically stable for discharge from surgical standpoint   Diamantina Monks, MD Trauma & General Surgery Please use [email or web address] to contact on call provider  01/03/2023  *Care during the described time interval was provided by me. I have reviewed this patient's available data, including medical history, events of note, physical examination and test results as part of my evaluation.

## 2023-01-03 NOTE — Discharge Instructions (Signed)

## 2023-01-03 NOTE — Progress Notes (Signed)
Pt significant others has been taught by this nurse how to flush and milk the bilary drain. All questions has been answered and she feels more comfortable with the process.

## 2023-01-03 NOTE — Discharge Summary (Signed)
Physician Discharge Summary  Samuel Mayo QVZ:[id number] DOB: 1945-05-30 DOA: 12/30/2022  PCP: [name], [name], MD  Admit date: 12/30/2022 Discharge date: 01/03/2023  Admitted From: Home Disposition: Home  Recommendations for Outpatient Follow-up:  Follow up with PCP in 1-2 weeks Please obtain BMP/CBC in one week your next doctors visit.  4 days p.o. antibiotics-Augmentin Outpatient follow-up with interventional radiology and general surgery  Home Health: None Equipment/Devices: None Discharge Condition: Stable CODE STATUS: Full code Diet recommendation: Cardiac    Brief Narrative:  77 year old with history of MR status post annuloplasty, A-flutter/A-fib on Eliquis, HTN, seizures comes to the ED with abdominal pain, nausea and anorexia.  Workup showed acute cholecystitis with CBD dilation.  Also noted to have transaminitis.  Patient started on broad-spectrum antibiotics, general surgery and IR were consulted.  IR planning percutaneous drain on 10/31.  Patient tolerated the procedure well, following day cleared for discharge on oral antibiotics.     Assessment & Plan:  Principal Problem:   Acute cholecystitis Active Problems:   Atrial fibrillation, chronic (HCC)   Hypertension   Seizure (HCC)   Acute cholecystitis with CBD dilation and transaminitis MRI of the abdomen is consistent with acute cholecystitis/Mirizzi syndrome.  LFTs are rising.  Seen by IR and general surgery, right upper quadrant drain placed on 10/21.  Will transition to 4 more days of oral Augmentin.  IR and general surgery to arrange for outpatient follow-up.   History of chronic atrial fibrillation/flutter Continue home Toprol-XL.  Ok to resume eliquis today per IR   Essential hypertension Norvasc 5 mg daily.    History of seizure Continue home Depakote   DVT prophylaxis:   Eliquis Code Status: Full code Family Communication: Wife present at bedside Disposition Plan: Hopefully dc later today    Subjective: Doing ok no complaints.  Tolerated his drain placement on 10/31   Examination:   General exam: Appears calm and comfortable  Respiratory system: Clear to auscultation. Respiratory effort normal. Cardiovascular system: S1 & S2 heard, RRR. No JVD, murmurs, rubs, gallops or clicks. No pedal edema. Gastrointestinal system: Abdomen is nondistended, soft and nontender. No organomegaly or masses felt. Normal bowel sounds heard. Central nervous system: Alert and oriented. No focal neurological deficits. Extremities: Symmetric 5 x 5 power. Skin: No rashes, lesions or ulcers Psychiatry: Judgement and insight appear normal. Mood & affect appropriate.    RUQ drain in palce   Discharge Diagnoses:  Principal Problem:   Acute cholecystitis Active Problems:   Atrial fibrillation, chronic (HCC)   Hypertension   Seizure (HCC)       Discharge Exam: Vitals:   01/03/23 1012 01/03/23 1233  BP: 115/86 117/75  Pulse: 90 90  Resp:  18  Temp:  98.1 F (36.7 C)  SpO2:  99%   Vitals:   01/03/23 0452 01/03/23 0837 01/03/23 1012 01/03/23 1233  BP:  120/88 115/86 117/75  Pulse:  84 90 90  Resp:  19  18  Temp:  97.9 F (36.6 C)  98.1 F (36.7 C)  TempSrc:  Oral  Oral  SpO2:  95%  99%  Weight: 91.3 kg     Height:        General: Pt is alert, awake, not in acute distress Cardiovascular: RRR, S1/S2 +, no rubs, no gallops Respiratory: CTA bilaterally, no wheezing, no rhonchi Abdominal: Soft, NT, ND, bowel sounds + Extremities: no edema, no cyanosis  Discharge Instructions   Allergies as of 01/03/2023       Reactions   Other  Other (See Comments)   Neosporin Ophthalmic causes eyes to turn red        Medication List     TAKE these medications    amLODipine 5 MG tablet Commonly known as: NORVASC TAKE 1 TABLET (5 MG TOTAL) BY MOUTH DAILY.   amoxicillin-clavulanate 875-125 MG tablet Commonly known as: AUGMENTIN Take 1 tablet by mouth 2 (two) times daily for 4  days.   Arnicare Gel Apply 1 Application topically daily as needed (For pain). On Ankle   beta carotene w/minerals tablet Take 1 tablet by mouth every evening.   divalproex 500 MG DR tablet Commonly known as: DEPAKOTE Take 1 tablet (500 mg total) by mouth 2 (two) times daily.   divalproex 250 MG DR tablet Commonly known as: DEPAKOTE Take 1 tablet (250 mg total) by mouth 2 (two) times daily. Take in addition to 500 mg twice a day (pt should have a total of 750 mg twice a day)   Eliquis 5 MG Tabs tablet Generic drug: apixaban TAKE 1 TABLET BY MOUTH TWICE A DAY   gabapentin 300 MG capsule Commonly known as: NEURONTIN Take 1 capsule (300 mg total) by mouth at bedtime.   ibandronate 150 MG tablet Commonly known as: BONIVA TAKE 1 TABLET BY MOUTH EVERY 30 DAYS. TAKE IN AM WITH FULL GLASS OF WATER ON EMPTY STOMACH AND DON'T TAKE ANYTHING ELSE BY MOUTH OR LIE DOWN FOR THE NEXT 30 MINUTES   metoprolol succinate 25 MG 24 hr tablet Commonly known as: TOPROL-XL TAKE 1 TABLET (25 MG TOTAL) BY MOUTH DAILY.   multivitamin with minerals Tabs tablet Take 1 tablet by mouth 2 (two) times daily. Vitamin Code Whole Food Multivitamin   Vitamin D3 50 MCG (2000 UT) Tabs Take 1 tablet (2,000 Units total) by mouth every evening. What changed: how much to take        Follow-up Information     Diamantina Monks, MD Follow up in 6 week(s).   Specialty: Surgery Why: our office is scheduling your appointment, call to confirm appointment date/time. Contact information: [address] [place] [address] [facility] [place] [address] [phone]         Irish Lack, MD Follow up.   Specialties: Interventional Radiology, Radiology Contact information: [address] [place] [address] [place] [address] [phone]                Allergies  Allergen Reactions   Other Other (See Comments)    Neosporin Ophthalmic causes eyes to turn red    You were cared for  by a hospitalist during your hospital stay. If you have any questions about your discharge medications or the care you received while you were in the hospital after you are discharged, you can call the unit and asked to speak with the hospitalist on call if the hospitalist that took care of you is not available. Once you are discharged, your primary care physician will handle any further medical issues. Please note that no refills for any discharge medications will be authorized once you are discharged, as it is imperative that you return to your primary care physician (or establish a relationship with a primary care physician if you do not have one) for your aftercare needs so that they can reassess your need for medications and monitor your lab values.  You were cared for by a hospitalist during your hospital stay. If you have any questions about your discharge medications or the care you received while  you were in the hospital after you are discharged, you can call the unit and asked to speak with the hospitalist on call if the hospitalist that took care of you is not available. Once you are discharged, your primary care physician will handle any further medical issues. Please note that NO REFILLS for any discharge medications will be authorized once you are discharged, as it is imperative that you return to your primary care physician (or establish a relationship with a primary care physician if you do not have one) for your aftercare needs so that they can reassess your need for medications and monitor your lab values.  Please request your Prim.MD to go over all Hospital Tests and Procedure/Radiological results at the follow up, please get all Hospital records sent to your Prim MD by signing hospital release before you go home.  Get CBC, CMP, 2 view Chest X ray checked  by Primary MD during your next visit or SNF MD in 5-7 days ( we routinely change or add medications that can affect your baseline labs  and fluid status, therefore we recommend that you get the mentioned basic workup next visit with your PCP, your PCP may decide not to get them or add new tests based on their clinical decision)  On your next visit with your primary care physician please Get Medicines reviewed and adjusted.  If you experience worsening of your admission symptoms, develop shortness of breath, life threatening emergency, suicidal or homicidal thoughts you must seek medical attention immediately by calling 911 or calling your MD immediately  if symptoms less severe.  You Must read complete instructions/literature along with all the possible adverse reactions/side effects for all the Medicines you take and that have been prescribed to you. Take any new Medicines after you have completely understood and accpet all the possible adverse reactions/side effects.   Do not drive, operate heavy machinery, perform activities at heights, swimming or participation in water activities or provide baby sitting services if your were admitted for syncope or siezures until you have seen by Primary MD or a Neurologist and advised to do so again.  Do not drive when taking Pain medications.   Procedures/Studies: IR Perc Cholecystostomy  Result Date: 01/02/2023 CLINICAL DATA:  Acute cholecystitis and need for percutaneous cholecystostomy tube due to current operative risks. EXAM: PERCUTANEOUS CHOLECYSTOSTOMY TUBE PLACEMENT COMPARISON:  Ultrasound of the right upper quadrant on 12/26/2022 and MRI of the abdomen on 12/29/2022 ANESTHESIA/SEDATION: Moderate (conscious) sedation was employed during this procedure. A total of Versed 1.0 mg and Fentanyl 50 mcg was administered intravenously. Moderate Sedation Time: 16 minutes. The patient's level of consciousness and vital signs were monitored continuously by radiology nursing throughout the procedure under my direct supervision. CONTRAST:  7mL OMNIPAQUE IOHEXOL 300 MG/ML  SOLN MEDICATIONS: No  additional medications. The patient is receiving IV antibiotics in the hospital. FLUOROSCOPY TIME:  54 seconds.  6.0 mGy. PROCEDURE: The procedure, risks, benefits, and alternatives were explained to the patient. Questions regarding the procedure were encouraged and answered. The patient understands and consents to the procedure. A time-out was performed prior to initiating the procedure. The right abdominal wall was prepped with chlorhexidine in a sterile fashion, and a sterile drape was applied covering the operative field. A sterile gown and sterile gloves were used for the procedure. Local anesthesia was provided with 1% Lidocaine. Ultrasound image documentation was performed. Fluoroscopy during the procedure and fluoro spot radiograph confirms appropriate catheter position. Ultrasound was utilized to localize the gallbladder.  Under direct ultrasound guidance, a 21 gauge needle was advanced via a transhepatic approach into the gallbladder lumen. Aspiration was performed and a bile sample sent for culture studies. A small amount of diluted contrast material was injected. A guide wire was then advanced into the gallbladder. A transitional dilator was placed. Percutaneous tract dilatation was then performed over a guide wire to 10-French. A 10-French pigtail drainage catheter was then advanced into the gallbladder lumen under fluoroscopy. Catheter was formed and injected with contrast material to confirm position. The catheter was flushed and connected to a gravity drainage bag. It was secured at the skin with a Prolene retention suture and Stat-Lock device. COMPLICATIONS: None FINDINGS: After needle puncture of the gallbladder, there was return of purulent bile. A bile sample was aspirated and sent for culture. The cholecystostomy tube was advanced into the gallbladder lumen and formed. It is now draining purulent fluid. This tube will be left to gravity drainage. IMPRESSION: Percutaneous cholecystostomy with  placement of 10-French drainage catheter into the gallbladder lumen. This was left to gravity drainage. There is return of purulent fluid from the gallbladder lumen. Electronically Signed   By: Irish Lack M.D.   On: 01/02/2023 11:19   US ABDOMEN LIMITED RUQ (LIVER/GB)  Result Date: 12/26/2022 CLINICAL DATA:  Jaundice EXAM: ULTRASOUND ABDOMEN LIMITED RIGHT UPPER QUADRANT COMPARISON:  None Available. FINDINGS: Gallbladder: Gallbladder is distended with wall thickening measuring up to 6 mm. Trace pericholecystic fluid. Small amount of sludge in the gallbladder lumen. Common bile duct: Diameter: 6 mm, dilated Liver: Increased echogenicity. Mildly nodular contour. No focal lesion. Portal vein is patent on color Doppler imaging with normal direction of blood flow towards the liver. Other: None. IMPRESSION: 1. Gallbladder is distended with wall thickening and trace pericholecystic fluid. Small amount of sludge in the gallbladder lumen. Findings are equivocal for acute cholecystitis. Common bile duct is dilated measuring 6 mm. Given the multitude of findings, and jaundice, recommend further evaluation with MRI/MRCP to assess for causative etiology and exclude possible underlying mass. 2. These results will be called to the ordering clinician or representative by the Radiologist Assistant, and communication documented in the PACS or [employer]. Electronically Signed   By: Annia Belt M.D.   On: 12/26/2022 08:58     The results of significant diagnostics from this hospitalization (including imaging, microbiology, ancillary and laboratory) are listed below for reference.     Microbiology: Recent Results (from the past 240 hour(s))  Culture, blood (Routine X 2) w Reflex to ID Panel     Status: None (Preliminary result)   Collection Time: 12/30/22  9:51 PM   Specimen: BLOOD LEFT ARM  Result Value Ref Range Status   Specimen Description BLOOD LEFT ARM  Final   Special Requests   Final    BOTTLES  DRAWN AEROBIC AND ANAEROBIC Blood Culture results may not be optimal due to an excessive volume of blood received in culture bottles   Culture   Final    NO GROWTH 4 DAYS Performed at [facility], [address] [place], [place] [address]    Report Status PENDING  Incomplete  Culture, blood (Routine X 2) w Reflex to ID Panel     Status: None (Preliminary result)   Collection Time: 12/30/22  9:51 PM   Specimen: BLOOD LEFT ARM  Result Value Ref Range Status   Specimen Description BLOOD LEFT ARM  Final   Special Requests   Final    BOTTLES DRAWN AEROBIC AND  ANAEROBIC Blood Culture adequate volume   Culture   Final    NO GROWTH 4 DAYS Performed at [facility], [address] [place], [place] [address]    Report Status PENDING  Incomplete  Surgical PCR screen     Status: None   Collection Time: 12/31/22  5:00 AM   Specimen: Nasal Mucosa; Nasal Swab  Result Value Ref Range Status   MRSA, PCR NEGATIVE NEGATIVE Final   Staphylococcus aureus NEGATIVE NEGATIVE Final    Comment: (NOTE) The Xpert SA Assay (FDA approved for NASAL specimens in patients 6 years of age and older), is one component of a comprehensive surveillance program. It is not intended to diagnose infection nor to guide or monitor treatment. Performed at [facility], [address] [place], [place] [address]   Aerobic/Anaerobic Culture w Gram Stain (surgical/deep wound)     Status: None (Preliminary result)   Collection Time: 01/02/23 10:52 AM   Specimen: Gallbladder; Bile  Result Value Ref Range Status   Specimen Description GALL BLADDER  Final   Special Requests BILIARY  Final   Gram Stain   Final    ABUNDANT WBC PRESENT,BOTH PMN AND MONONUCLEAR NO ORGANISMS SEEN    Culture   Final    CULTURE REINCUBATED FOR BETTER GROWTH Performed at [facility], [address] [place], [place] [address]    Report Status PENDING  Incomplete     Labs: BNP (last 3 results) No results for  input(s): "BNP" in the last 8760 hours. Basic Metabolic Panel: Recent Labs  Lab 12/30/22 1600 12/31/22 0705 01/01/23 1154 01/02/23 1329 01/03/23 0807  NA 134* 133* 129* 128* 129*  K 4.3 4.2 3.6 3.7 3.7  CL 99 98 95* 94* 98  CO2 26 24 20* 24 23  GLUCOSE 131* 102* 94 98 97  BUN 29* 21 14 15 17   CREATININE 0.87 1.00 1.00 0.94 0.83  CALCIUM 9.3 8.9 7.8* 8.0* 7.6*  MG  --   --  1.8 1.9 2.0  PHOS  --   --  3.3  --   --    Liver Function Tests: Recent Labs  Lab 12/30/22 1600 12/31/22 0705 01/01/23 1154 01/02/23 1329 01/03/23 0807  AST 87* 95* 43* 41 44*  ALT 90* 100* 51* 47* 43  ALKPHOS 511* 488* 308* 285* 264*  BILITOT 0.8 1.1 0.6 0.6 0.7  PROT 6.5 6.6 5.2* 5.6* 4.9*  ALBUMIN 3.5 2.6* 2.0* 2.0* 1.8*   Recent Labs  Lab 12/30/22 1600  LIPASE 49   No results for input(s): "AMMONIA" in the last 168 hours. CBC: Recent Labs  Lab 12/30/22 1600 12/31/22 0705 01/01/23 1154 01/02/23 1329 01/03/23 0807  WBC 5.9 9.5 7.7 6.6 5.1  HGB 12.2* 13.3 11.9* 11.9* 11.2*  HCT 36.6* 40.3 33.9* 35.2* 32.4*  MCV 98.9 99.0 96.3 94.6 94.7  PLT 290 334 232 250 215   Cardiac Enzymes: No results for input(s): "CKTOTAL", "CKMB", "CKMBINDEX", "TROPONINI" in the last 168 hours. BNP: Invalid input(s): "POCBNP" CBG: No results for input(s): "GLUCAP" in the last 168 hours. D-Dimer No results for input(s): "DDIMER" in the last 72 hours. Hgb A1c No results for input(s): "HGBA1C" in the last 72 hours. Lipid Profile No results for input(s): "CHOL", "HDL", "LDLCALC", "TRIG", "CHOLHDL", "LDLDIRECT" in the last 72 hours. Thyroid function studies No results for input(s): "TSH", "T4TOTAL", "T3FREE", "THYROIDAB" in the last 72 hours.  Invalid input(s): "FREET3" Anemia work up No results for input(s): "VITAMINB12", "FOLATE", "FERRITIN", "  TIBC", "IRON", "RETICCTPCT" in the last 72 hours. Urinalysis    Component Value Date/Time   COLORURINE YELLOW 12/30/2022 1600   APPEARANCEUR CLEAR 12/30/2022  1600   APPEARANCEUR Hazy (A) 03/27/2020 1413   LABSPEC 1.042 (H) 12/30/2022 1600   LABSPEC 1.015 02/03/2012 1915   PHURINE 6.0 12/30/2022 1600   GLUCOSEU NEGATIVE 12/30/2022 1600   GLUCOSEU NEGATIVE 12/23/2022 1055   HGBUR NEGATIVE 12/30/2022 1600   BILIRUBINUR NEGATIVE 12/30/2022 1600   BILIRUBINUR Negative 03/27/2020 1413   BILIRUBINUR Negative 02/03/2012 1915   KETONESUR TRACE (A) 12/30/2022 1600   PROTEINUR 30 (A) 12/30/2022 1600   UROBILINOGEN 2.0 (A) 12/23/2022 1055   NITRITE NEGATIVE 12/30/2022 1600   LEUKOCYTESUR NEGATIVE 12/30/2022 1600   LEUKOCYTESUR Negative 02/03/2012 1915   Sepsis Labs Recent Labs  Lab 12/31/22 0705 01/01/23 1154 01/02/23 1329 01/03/23 0807  WBC 9.5 7.7 6.6 5.1   Microbiology Recent Results (from the past 240 hour(s))  Culture, blood (Routine X 2) w Reflex to ID Panel     Status: None (Preliminary result)   Collection Time: 12/30/22  9:51 PM   Specimen: BLOOD LEFT ARM  Result Value Ref Range Status   Specimen Description BLOOD LEFT ARM  Final   Special Requests   Final    BOTTLES DRAWN AEROBIC AND ANAEROBIC Blood Culture results may not be optimal due to an excessive volume of blood received in culture bottles   Culture   Final    NO GROWTH 4 DAYS Performed at [facility], [address], [place], [place] [address]    Report Status PENDING  Incomplete  Culture, blood (Routine X 2) w Reflex to ID Panel     Status: None (Preliminary result)   Collection Time: 12/30/22  9:51 PM   Specimen: BLOOD LEFT ARM  Result Value Ref Range Status   Specimen Description BLOOD LEFT ARM  Final   Special Requests   Final    BOTTLES DRAWN AEROBIC AND ANAEROBIC Blood Culture adequate volume   Culture   Final    NO GROWTH 4 DAYS Performed at [facility], [address] [place], [place] [address]    Report Status PENDING  Incomplete  Surgical PCR screen     Status: None   Collection Time: 12/31/22  5:00 AM   Specimen: Nasal Mucosa; Nasal  Swab  Result Value Ref Range Status   MRSA, PCR NEGATIVE NEGATIVE Final   Staphylococcus aureus NEGATIVE NEGATIVE Final    Comment: (NOTE) The Xpert SA Assay (FDA approved for NASAL specimens in patients 47 years of age and older), is one component of a comprehensive surveillance program. It is not intended to diagnose infection nor to guide or monitor treatment. Performed at [facility], [address], [place], [place] [address]   Aerobic/Anaerobic Culture w Gram Stain (surgical/deep wound)     Status: None (Preliminary result)   Collection Time: 01/02/23 10:52 AM   Specimen: Gallbladder; Bile  Result Value Ref Range Status   Specimen Description GALL BLADDER  Final   Special Requests BILIARY  Final   Gram Stain   Final    ABUNDANT WBC PRESENT,BOTH PMN AND MONONUCLEAR NO ORGANISMS SEEN    Culture   Final    CULTURE REINCUBATED FOR BETTER GROWTH Performed at [facility] Lab, [address] [place], [place] [address]    Report Status PENDING  Incomplete     Time coordinating discharge:  I have spent 35 minutes face to face  with the patient and on the ward discussing the patients care, assessment, plan and disposition with other care givers. >50% of the time was devoted counseling the patient about the risks and benefits of treatment/Discharge disposition and coordinating care.   SIGNED:   Miguel Rota, MD  [facility] 01/03/2023, 12:44 PM   If 7PM-7AM, please contact night-coverage

## 2023-01-03 NOTE — Progress Notes (Signed)
   01/03/23 1003  Mobility  Activity Ambulated with assistance in hallway  Level of Assistance Contact guard assist, steadying assist  Assistive Device Front wheel walker  Distance Ambulated (ft) 225 ft  Activity Response Tolerated fair  Mobility Referral Yes  $Mobility charge 1 Mobility  Mobility Specialist Start Time (ACUTE ONLY) [phone]  Mobility Specialist Stop Time (ACUTE ONLY) 1003  Mobility Specialist Time Calculation (min) (ACUTE ONLY) 20 min   Mobility Specialist: Progress Note  Pt agreeable to mobility session - received in bed. Required CG using RW. Pt was asymptomatic throughout session with no complaints. Returned to chair with all needs met - call bell within reach. Family present.   Opted to use RW for stability and coordination.  Barnie Mort, BS Mobility Specialist Please contact via SecureChat or Rehab office at [phone].

## 2023-01-03 NOTE — Progress Notes (Signed)
I have reviewed and concur with this student's documentation.   

## 2023-01-03 NOTE — Plan of Care (Signed)

## 2023-01-04 LAB — CULTURE, BLOOD (ROUTINE X 2)
Culture: NO GROWTH
Culture: NO GROWTH
Special Requests: ADEQUATE

## 2023-01-06 ENCOUNTER — Telehealth: Payer: Self-pay

## 2023-01-06 NOTE — Transitions of Care (Post Inpatient/ED Visit) (Signed)
01/06/2023  Name: Samuel Mayo MRN: [id number] DOB: June 12, 1945  Today's TOC FU Call Status: Today's TOC FU Call Status:: Successful TOC FU Call Completed TOC FU Call Complete Date: 01/06/23 Patient's Name and Date of Birth confirmed.  Transition Care Management Follow-up Telephone Call Date of Discharge: 01/03/23 Discharge Facility: [facility]) Type of Discharge: Inpatient Admission Primary Inpatient Discharge Diagnosis:: "acute cholecystitis" How have you been since you were released from the hospital?: Better (Pt voices he is doing ok-has some discomfort when changing positions-haven't needed to take anything, he has been having some freq urination at night-affecting sleep-will discuss with PCP during appt, appetite has been fair-denies any GI sxs) Any questions or concerns?: No  Items Reviewed: Did you receive and understand the discharge instructions provided?: No Medications obtained,verified, and reconciled?: Yes (Medications Reviewed) Any new allergies since your discharge?: No Dietary orders reviewed?: Yes Type of Diet Ordered:: low salt/heart healthy Do you have support at home?: Yes People in Home: significant other Name of Support/Comfort Primary Source: Nicholos Johns  Medications Reviewed Today: Medications Reviewed Today     Reviewed by Charlyn Minerva, RN (Registered Nurse) on 01/06/23 at 1058  Med List Status: <None>   Medication Order Taking? Sig Documenting Provider Last Dose Status Informant  amLODipine (NORVASC) 5 MG tablet [id number] Yes TAKE 1 TABLET (5 MG TOTAL) BY MOUTH DAILY. [name], [name], MD Taking Active Self  amoxicillin-clavulanate (AUGMENTIN) 875-125 MG tablet [id number] Yes Take 1 tablet by mouth 2 (two) times daily for 4 days. Miguel Rota, MD Taking Active   beta carotene w/minerals (OCUVITE) tablet [id number] Yes Take 1 tablet by mouth every evening. [provider] Taking Active Self  Cholecalciferol (VITAMIN D3) 50  MCG (2000 UT) TABS [id number] Yes Take 1 tablet (2,000 Units total) by mouth every evening. Miguel Rota, MD Taking Active   divalproex (DEPAKOTE) 250 MG DR tablet [id number] Yes Take 1 tablet (250 mg total) by mouth 2 (two) times daily. Take in addition to 500 mg twice a day (pt should have a total of 750 mg twice a day) Micki Riley, MD Taking Active Self  divalproex (DEPAKOTE) 500 MG DR tablet [id number] Yes Take 1 tablet (500 mg total) by mouth 2 (two) times daily. Micki Riley, MD Taking Active Self  ELIQUIS 5 MG TABS tablet [id number] Yes TAKE 1 TABLET BY MOUTH TWICE A DAY Mealor, Roberts Gaudy, MD Taking Active Self  gabapentin (NEURONTIN) 300 MG capsule [id number] Yes Take 1 capsule (300 mg total) by mouth at bedtime. Micki Riley, MD Taking Active Self  Homeopathic Products (ARNICARE) GEL [id number] Yes Apply 1 Application topically daily as needed (For pain). On Ankle [provider] Taking Active Self  ibandronate (BONIVA) 150 MG tablet [id number] Yes TAKE 1 TABLET BY MOUTH EVERY 30 DAYS. TAKE IN AM WITH FULL GLASS OF WATER ON EMPTY STOMACH AND DON'T TAKE ANYTHING ELSE BY MOUTH OR LIE DOWN FOR THE NEXT 30 MINUTES [name], [name], MD Taking Active Self  metoprolol succinate (TOPROL-XL) 25 MG 24 hr tablet [id number] Yes TAKE 1 TABLET (25 MG TOTAL) BY MOUTH DAILY. [name], MD Taking Active Self  Multiple Vitamin (MULTIVITAMIN WITH MINERALS) TABS tablet [id number] Yes Take 1 tablet by mouth 2 (two) times daily. Vitamin Code Whole Food Multivitamin [provider] Taking Active Self  sodium chloride flush 0.9 % SOLN injection [id number]  10 mLs by Intracatheter route as needed. Flush percutaneous cholecystostomy drain with 10 ml normal saline once daily.  Mickie Kay, NP  Active             Home Care and Equipment/Supplies: Were Home Health Services Ordered?: NA Any new equipment or medical supplies ordered?: NA  Functional Questionnaire: Do you need assistance  with bathing/showering or dressing?: No Do you need assistance with meal preparation?: No Do you need assistance with eating?: No Do you have difficulty maintaining continence: No Do you need assistance with getting out of bed/getting out of a chair/moving?: No Do you have difficulty managing or taking your medications?: No  Follow up appointments reviewed: PCP Follow-up appointment confirmed?: Yes Date of PCP follow-up appointment?: 01/13/23 (care guide assisted with making PCP f/u appt during call) Follow-up Provider: Dr. [name] [facility] Follow-up appointment confirmed?: No Reason Specialist Follow-Up Not Confirmed: Patient has Specialist Provider Number and will Call for Appointment (pt aware to f/u with IR dept and surgeon office-confirmed he has contact info to call office) Do you need transportation to your follow-up appointment?: No (Sig other will be taking him to appts) Do you understand care options if your condition(s) worsen?: Yes-patient verbalized understanding  SDOH Interventions Today    Flowsheet Row Most Recent Value  SDOH Interventions   Food Insecurity Interventions Intervention Not Indicated  Transportation Interventions Intervention Not Indicated       Antionette Fairy, RN,BSN,CCM RN Care Manager Transitions of Care  [facility]  Direct Phone: [phone] Toll Free: [phone] Fax: [phone]

## 2023-01-07 LAB — AEROBIC/ANAEROBIC CULTURE W GRAM STAIN (SURGICAL/DEEP WOUND)

## 2023-01-08 NOTE — Progress Notes (Signed)
No chief complaint on file.  HPI: [name] Mayo is a 77 y.o. male with a PMHx significant for HTN, seizure disorder, vitamin D deficiency, osteoporosis, and PAF on chronic anticoagulation, who is here today for hospital follow up.     -seen in ER on 10/28 for cholecystitis/Mirizzi synd Hospitalized from 10/28 to 01/03/23. TOC call on 01/06/23.  Lab Results  Component Value Date   ALT 43 01/03/2023   AST 44 (H) 01/03/2023   ALKPHOS 264 (H) 01/03/2023   BILITOT 0.7 01/03/2023   Lab Results  Component Value Date   WBC 5.1 01/03/2023   HGB 11.2 (L) 01/03/2023   HCT 32.4 (L) 01/03/2023   MCV 94.7 01/03/2023   PLT 215 01/03/2023  General surgery and IR consultation during hospitalization. MRCP was performed and showed cholecystitis w/ a small abscess adjacent to the fundus and hepatic ductal dilation.   S/p percutaneous draining , RUQ drain placed on 10/21.*** IV Zosyn and he was discharged on Augmentin 875-125 mg.   Review of Systems See other pertinent positives and negatives in HPI.  Current Outpatient Medications on File Prior to Visit  Medication Sig Dispense Refill   amLODipine (NORVASC) 5 MG tablet TAKE 1 TABLET (5 MG TOTAL) BY MOUTH DAILY. 90 tablet 3   beta carotene w/minerals (OCUVITE) tablet Take 1 tablet by mouth every evening.     Cholecalciferol (VITAMIN D3) 50 MCG (2000 UT) TABS Take 1 tablet (2,000 Units total) by mouth every evening.     divalproex (DEPAKOTE) 250 MG DR tablet Take 1 tablet (250 mg total) by mouth 2 (two) times daily. Take in addition to 500 mg twice a day (pt should have a total of 750 mg twice a day) 60 tablet 5   divalproex (DEPAKOTE) 500 MG DR tablet Take 1 tablet (500 mg total) by mouth 2 (two) times daily. 180 tablet 2   ELIQUIS 5 MG TABS tablet TAKE 1 TABLET BY MOUTH TWICE A DAY 60 tablet 5   gabapentin (NEURONTIN) 300 MG capsule Take 1 capsule (300 mg total) by mouth at bedtime. 90 capsule 1   Homeopathic Products (ARNICARE)  GEL Apply 1 Application topically daily as needed (For pain). On Ankle     ibandronate (BONIVA) 150 MG tablet TAKE 1 TABLET BY MOUTH EVERY 30 DAYS. TAKE IN AM WITH FULL GLASS OF WATER ON EMPTY STOMACH AND DON'T TAKE ANYTHING ELSE BY MOUTH OR LIE DOWN FOR THE NEXT 30 MINUTES 9 tablet 3   metoprolol succinate (TOPROL-XL) 25 MG 24 hr tablet TAKE 1 TABLET (25 MG TOTAL) BY MOUTH DAILY. 90 tablet 3   Multiple Vitamin (MULTIVITAMIN WITH MINERALS) TABS tablet Take 1 tablet by mouth 2 (two) times daily. Vitamin Code Whole Food Multivitamin     sodium chloride flush 0.9 % SOLN injection 10 mLs by Intracatheter route as needed. Flush percutaneous cholecystostomy drain with 10 ml normal saline once daily. 300 mL 3   No current facility-administered medications on file prior to visit.    Past Medical History:  Diagnosis Date   AK (actinic keratosis)    Allergy    Chicken pox    Compression fracture of L1 lumbar vertebra (HCC)    Compression fracture of thoracic vertebra (HCC)    Hypertension    Lentigines    Follows with DR Culton (derma)   Seizures (HCC)    Allergies  Allergen Reactions   Other Other (See Comments)    Neosporin Ophthalmic causes eyes to turn red  Social History   Socioeconomic History   Marital status: Domestic Partner    Spouse name: Samuel Mayo Mayo   Number of children: Not on file   Years of education: Not on file   Highest education level: Bachelor's degree (e.g., BA, AB, BS)  Occupational History   Not on file  Tobacco Use   Smoking status: Never   Smokeless tobacco: Never   Tobacco comments:    Never smoke 10/29/21   Vaping Use   Vaping status: Never Used  Substance and Sexual Activity   Alcohol use: Yes    Alcohol/week: 14.0 standard drinks of alcohol    Types: 14 Standard drinks or equivalent per week    Comment: 2 glasses of wine nightly 10/29/21   Drug use: No   Sexual activity: Not on file  Other Topics Concern   Not on file  Social History Narrative    Lives with wife Samuel Mayo Mayo   R handed   Caffeine: 3 C of coffee AM   Social Determinants of Health   Financial Resource Strain: Low Risk  (10/21/2022)   Overall Financial Resource Strain (CARDIA)    Difficulty of Paying Living Expenses: Not hard at all  Food Insecurity: No Food Insecurity (01/06/2023)   Hunger Vital Sign    Worried About Running Out of Food in the Last Year: Never true    Ran Out of Food in the Last Year: Never true  Transportation Needs: No Transportation Needs (01/06/2023)   PRAPARE - Administrator, Civil Service (Medical): No    Lack of Transportation (Non-Medical): No  Physical Activity: Insufficiently Active (10/21/2022)   Exercise Vital Sign    Days of Exercise per Week: 3 days    Minutes of Exercise per Session: 40 min  Stress: Stress Concern Present (10/21/2022)   Samuel Mayo Mayo [employer] Occupational [employer] - Occupational Stress Questionnaire    Feeling of Stress : Rather much  Social Connections: Moderately Integrated (10/21/2022)   Social Connection and Isolation Panel [NHANES]    Frequency of Communication with Friends and Family: Three times a week    Frequency of Social Gatherings with Friends and Family: Once a week    Attends Religious Services: Never    Database administrator or Organizations: No    Attends Engineer, structural: More than 4 times per year    Marital Status: Living with partner    There were no vitals filed for this visit. There is no height or weight on file to calculate BMI.  Physical Exam  ASSESSMENT AND PLAN:  [name] Mayo was seen today for hospital follow up for cholecystitis.   There are no diagnoses linked to this encounter.  No follow-ups on file.  I, Samuel Mayo Mayo, acting as a scribe for Samuel Mayo Nicole [name], MD., have documented all relevant documentation on the behalf of Samuel Mayo Broadnax [name], MD, as directed by  Samuel Carden [name], MD while in the presence of Samuel Mayo Banka [name], MD.   I, Samuel Mayo Mayo, have reviewed all  documentation for this visit. The documentation on 01/08/23 for the exam, diagnosis, procedures, and orders are all accurate and complete.  Samuel Mayo Woolford G. [name], MD  [facility]. [place] office.  Discharge Instructions   None

## 2023-01-13 ENCOUNTER — Encounter: Payer: Self-pay | Admitting: Family Medicine

## 2023-01-13 ENCOUNTER — Ambulatory Visit: Payer: Medicare Other | Admitting: Family Medicine

## 2023-01-13 VITALS — BP 118/78 | HR 100 | Temp 98.7°F | Resp 16 | Ht 71.0 in | Wt 189.1 lb

## 2023-01-13 DIAGNOSIS — I482 Chronic atrial fibrillation, unspecified: Secondary | ICD-10-CM | POA: Diagnosis not present

## 2023-01-13 DIAGNOSIS — R7989 Other specified abnormal findings of blood chemistry: Secondary | ICD-10-CM | POA: Diagnosis not present

## 2023-01-13 DIAGNOSIS — I10 Essential (primary) hypertension: Secondary | ICD-10-CM

## 2023-01-13 DIAGNOSIS — K810 Acute cholecystitis: Secondary | ICD-10-CM | POA: Diagnosis not present

## 2023-01-13 LAB — CBC WITH DIFFERENTIAL/PLATELET
Basophils Absolute: 0.1 10*3/uL (ref 0.0–0.1)
Basophils Relative: 1.1 % (ref 0.0–3.0)
Eosinophils Absolute: 0.1 10*3/uL (ref 0.0–0.7)
Eosinophils Relative: 2 % (ref 0.0–5.0)
HCT: 35.1 % — ABNORMAL LOW (ref 39.0–52.0)
Hemoglobin: 12 g/dL — ABNORMAL LOW (ref 13.0–17.0)
Lymphocytes Relative: 17.8 % (ref 12.0–46.0)
Lymphs Abs: 0.9 10*3/uL (ref 0.7–4.0)
MCHC: 34.3 g/dL (ref 30.0–36.0)
MCV: 100 fL (ref 78.0–100.0)
Monocytes Absolute: 0.8 10*3/uL (ref 0.1–1.0)
Monocytes Relative: 16.1 % — ABNORMAL HIGH (ref 3.0–12.0)
Neutro Abs: 3.2 10*3/uL (ref 1.4–7.7)
Neutrophils Relative %: 63 % (ref 43.0–77.0)
Platelets: 332 10*3/uL (ref 150.0–400.0)
RBC: 3.51 Mil/uL — ABNORMAL LOW (ref 4.22–5.81)
RDW: 14.6 % (ref 11.5–15.5)
WBC: 5.1 10*3/uL (ref 4.0–10.5)

## 2023-01-13 LAB — BASIC METABOLIC PANEL
BUN: 20 mg/dL (ref 6–23)
CO2: 28 meq/L (ref 19–32)
Calcium: 8.9 mg/dL (ref 8.4–10.5)
Chloride: 95 meq/L — ABNORMAL LOW (ref 96–112)
Creatinine, Ser: 1.09 mg/dL (ref 0.40–1.50)
GFR: 64.83 mL/min (ref >60.00)
Glucose, Bld: 134 mg/dL — ABNORMAL HIGH (ref 70–99)
Potassium: 4.1 meq/L (ref 3.5–5.1)
Sodium: 132 meq/L — ABNORMAL LOW (ref 135–145)

## 2023-01-13 LAB — HEPATIC FUNCTION PANEL
ALT: 150 U/L — ABNORMAL HIGH (ref 0–53)
AST: 135 U/L — ABNORMAL HIGH (ref 0–37)
Albumin: 3.4 g/dL — ABNORMAL LOW (ref 3.5–5.2)
Alkaline Phosphatase: 694 U/L — ABNORMAL HIGH (ref 39–117)
Bilirubin, Direct: 2.5 mg/dL — ABNORMAL HIGH (ref 0.0–0.3)
Total Bilirubin: 3.9 mg/dL — ABNORMAL HIGH (ref 0.2–1.2)
Total Protein: 6.1 g/dL (ref 6.0–8.3)

## 2023-01-13 NOTE — Assessment & Plan Note (Signed)
He is not having abdominal pain,N/V, fever,or chills. S/P percutaneous drain placement. He has an appt with general surgeon on 02/13/23. He was clearly instructed about warning signs.

## 2023-01-13 NOTE — Patient Instructions (Addendum)
A few things to remember from today's visit:  Acute cholecystitis - Plan: Basic metabolic panel, CBC with Differential/Platelet, Hepatic function panel  Abnormal LFTs - Plan: CBC with Differential/Platelet, Hepatic function panel No changes today. Continue adequate hydration. If no appetite you can try Ensure to replace meals. Monitor temp.  If you need refills for medications you take chronically, please call your pharmacy. Do not use My Chart to request refills or for acute issues that need immediate attention. If you send a my chart message, it may take a few days to be addressed, specially if I am not in the office.  Please be sure medication list is accurate. If a new problem present, please set up appointment sooner than planned today.

## 2023-01-13 NOTE — Assessment & Plan Note (Signed)
Rhythm controlled. Currently on Eliquis 5 mg twice daily and metoprolol succinate 25 mg daily. Follows with cardiologist.

## 2023-01-13 NOTE — Assessment & Plan Note (Signed)
BP adequately controlled. Continue amlodipine 5 mg daily and metoprolol succinate 25 mg daily as well as low-salt diet.

## 2023-01-14 ENCOUNTER — Telehealth: Payer: Self-pay

## 2023-01-14 ENCOUNTER — Other Ambulatory Visit: Payer: Self-pay

## 2023-01-14 DIAGNOSIS — R7989 Other specified abnormal findings of blood chemistry: Secondary | ICD-10-CM

## 2023-01-14 NOTE — Telephone Encounter (Signed)
Called and spoke with [facility] to see if pt's visit can be moved up from 12/12 due to rise in liver panel numbers. Surgeon won't be back in office until 11/21 and office suggested that pt go back to the ED if numbers are rising. Pt is aware of what signs to look for that indicate going back to the ED. Routed to PCP as FYI.

## 2023-01-24 ENCOUNTER — Inpatient Hospital Stay (HOSPITAL_COMMUNITY)
Admission: EM | Admit: 2023-01-24 | Discharge: 2023-02-24 | DRG: 435 | Disposition: A | Discharge status: Skilled Nursing Facility | Payer: Medicare Other | Source: Ambulatory Visit | Attending: Family Medicine | Admitting: Family Medicine

## 2023-01-24 ENCOUNTER — Other Ambulatory Visit (HOSPITAL_COMMUNITY): Payer: Self-pay | Admitting: Surgery

## 2023-01-24 ENCOUNTER — Other Ambulatory Visit: Payer: Self-pay

## 2023-01-24 ENCOUNTER — Encounter (HOSPITAL_COMMUNITY): Payer: Self-pay

## 2023-01-24 ENCOUNTER — Ambulatory Visit (HOSPITAL_COMMUNITY)
Admission: RE | Admit: 2023-01-24 | Discharge: 2023-01-24 | Disposition: A | Discharge status: Home / Self Care | Payer: Medicare Other | Source: Ambulatory Visit | Attending: Surgery | Admitting: Surgery

## 2023-01-24 ENCOUNTER — Other Ambulatory Visit: Payer: Medicare Other

## 2023-01-24 ENCOUNTER — Emergency Department (HOSPITAL_COMMUNITY): Payer: Medicare Other

## 2023-01-24 DIAGNOSIS — K810 Acute cholecystitis: Secondary | ICD-10-CM | POA: Insufficient documentation

## 2023-01-24 DIAGNOSIS — Z8249 Family history of ischemic heart disease and other diseases of the circulatory system: Secondary | ICD-10-CM

## 2023-01-24 DIAGNOSIS — R7989 Other specified abnormal findings of blood chemistry: Secondary | ICD-10-CM

## 2023-01-24 DIAGNOSIS — I480 Paroxysmal atrial fibrillation: Secondary | ICD-10-CM | POA: Diagnosis not present

## 2023-01-24 DIAGNOSIS — E860 Dehydration: Secondary | ICD-10-CM | POA: Diagnosis not present

## 2023-01-24 DIAGNOSIS — C787 Secondary malignant neoplasm of liver and intrahepatic bile duct: Secondary | ICD-10-CM | POA: Diagnosis present

## 2023-01-24 DIAGNOSIS — R17 Unspecified jaundice: Secondary | ICD-10-CM | POA: Diagnosis not present

## 2023-01-24 DIAGNOSIS — Z66 Do not resuscitate: Secondary | ICD-10-CM | POA: Diagnosis not present

## 2023-01-24 DIAGNOSIS — Z79899 Other long term (current) drug therapy: Secondary | ICD-10-CM

## 2023-01-24 DIAGNOSIS — E440 Moderate protein-calorie malnutrition: Secondary | ICD-10-CM | POA: Insufficient documentation

## 2023-01-24 DIAGNOSIS — K5750 Diverticulosis of both small and large intestine without perforation or abscess without bleeding: Secondary | ICD-10-CM | POA: Diagnosis not present

## 2023-01-24 DIAGNOSIS — E871 Hypo-osmolality and hyponatremia: Secondary | ICD-10-CM | POA: Diagnosis not present

## 2023-01-24 DIAGNOSIS — Z7983 Long term (current) use of bisphosphonates: Secondary | ICD-10-CM

## 2023-01-24 DIAGNOSIS — Z434 Encounter for attention to other artificial openings of digestive tract: Secondary | ICD-10-CM | POA: Insufficient documentation

## 2023-01-24 DIAGNOSIS — K851 Biliary acute pancreatitis without necrosis or infection: Secondary | ICD-10-CM | POA: Diagnosis not present

## 2023-01-24 DIAGNOSIS — I4892 Unspecified atrial flutter: Secondary | ICD-10-CM | POA: Diagnosis not present

## 2023-01-24 DIAGNOSIS — K819 Cholecystitis, unspecified: Secondary | ICD-10-CM | POA: Diagnosis not present

## 2023-01-24 DIAGNOSIS — I10 Essential (primary) hypertension: Secondary | ICD-10-CM | POA: Diagnosis not present

## 2023-01-24 DIAGNOSIS — D62 Acute posthemorrhagic anemia: Secondary | ICD-10-CM | POA: Diagnosis not present

## 2023-01-24 DIAGNOSIS — Z818 Family history of other mental and behavioral disorders: Secondary | ICD-10-CM

## 2023-01-24 DIAGNOSIS — G4700 Insomnia, unspecified: Secondary | ICD-10-CM | POA: Diagnosis not present

## 2023-01-24 DIAGNOSIS — K831 Obstruction of bile duct: Secondary | ICD-10-CM | POA: Diagnosis present

## 2023-01-24 DIAGNOSIS — C7951 Secondary malignant neoplasm of bone: Secondary | ICD-10-CM | POA: Diagnosis present

## 2023-01-24 DIAGNOSIS — C240 Malignant neoplasm of extrahepatic bile duct: Principal | ICD-10-CM | POA: Diagnosis present

## 2023-01-24 DIAGNOSIS — E872 Acidosis, unspecified: Secondary | ICD-10-CM | POA: Diagnosis present

## 2023-01-24 DIAGNOSIS — E875 Hyperkalemia: Secondary | ICD-10-CM | POA: Diagnosis not present

## 2023-01-24 DIAGNOSIS — Z883 Allergy status to other anti-infective agents status: Secondary | ICD-10-CM

## 2023-01-24 DIAGNOSIS — N179 Acute kidney failure, unspecified: Secondary | ICD-10-CM | POA: Diagnosis not present

## 2023-01-24 DIAGNOSIS — D539 Nutritional anemia, unspecified: Secondary | ICD-10-CM | POA: Diagnosis not present

## 2023-01-24 DIAGNOSIS — K820 Obstruction of gallbladder: Secondary | ICD-10-CM | POA: Diagnosis not present

## 2023-01-24 DIAGNOSIS — Z5309 Procedure and treatment not carried out because of other contraindication: Secondary | ICD-10-CM | POA: Diagnosis present

## 2023-01-24 DIAGNOSIS — A4152 Sepsis due to Pseudomonas: Secondary | ICD-10-CM | POA: Diagnosis not present

## 2023-01-24 DIAGNOSIS — Z952 Presence of prosthetic heart valve: Secondary | ICD-10-CM | POA: Diagnosis not present

## 2023-01-24 DIAGNOSIS — R188 Other ascites: Secondary | ICD-10-CM | POA: Diagnosis not present

## 2023-01-24 DIAGNOSIS — D630 Anemia in neoplastic disease: Secondary | ICD-10-CM | POA: Diagnosis not present

## 2023-01-24 DIAGNOSIS — E43 Unspecified severe protein-calorie malnutrition: Secondary | ICD-10-CM | POA: Diagnosis not present

## 2023-01-24 DIAGNOSIS — K838 Other specified diseases of biliary tract: Secondary | ICD-10-CM

## 2023-01-24 DIAGNOSIS — Z7901 Long term (current) use of anticoagulants: Secondary | ICD-10-CM

## 2023-01-24 DIAGNOSIS — R7401 Elevation of levels of liver transaminase levels: Secondary | ICD-10-CM | POA: Diagnosis not present

## 2023-01-24 DIAGNOSIS — I959 Hypotension, unspecified: Secondary | ICD-10-CM | POA: Diagnosis not present

## 2023-01-24 DIAGNOSIS — R569 Unspecified convulsions: Secondary | ICD-10-CM | POA: Diagnosis not present

## 2023-01-24 DIAGNOSIS — L89152 Pressure ulcer of sacral region, stage 2: Secondary | ICD-10-CM | POA: Diagnosis not present

## 2023-01-24 DIAGNOSIS — K8309 Other cholangitis: Secondary | ICD-10-CM | POA: Diagnosis not present

## 2023-01-24 DIAGNOSIS — I951 Orthostatic hypotension: Secondary | ICD-10-CM | POA: Diagnosis not present

## 2023-01-24 DIAGNOSIS — M810 Age-related osteoporosis without current pathological fracture: Secondary | ICD-10-CM | POA: Diagnosis present

## 2023-01-24 DIAGNOSIS — E222 Syndrome of inappropriate secretion of antidiuretic hormone: Secondary | ICD-10-CM | POA: Diagnosis not present

## 2023-01-24 DIAGNOSIS — I482 Chronic atrial fibrillation, unspecified: Secondary | ICD-10-CM | POA: Diagnosis present

## 2023-01-24 DIAGNOSIS — Z7189 Other specified counseling: Secondary | ICD-10-CM | POA: Diagnosis not present

## 2023-01-24 DIAGNOSIS — Z6826 Body mass index (BMI) 26.0-26.9, adult: Secondary | ICD-10-CM

## 2023-01-24 DIAGNOSIS — Z8042 Family history of malignant neoplasm of prostate: Secondary | ICD-10-CM

## 2023-01-24 DIAGNOSIS — I4891 Unspecified atrial fibrillation: Secondary | ICD-10-CM | POA: Diagnosis not present

## 2023-01-24 DIAGNOSIS — G40909 Epilepsy, unspecified, not intractable, without status epilepticus: Secondary | ICD-10-CM | POA: Diagnosis present

## 2023-01-24 DIAGNOSIS — D7589 Other specified diseases of blood and blood-forming organs: Secondary | ICD-10-CM | POA: Diagnosis not present

## 2023-01-24 DIAGNOSIS — C252 Malignant neoplasm of tail of pancreas: Secondary | ICD-10-CM | POA: Diagnosis not present

## 2023-01-24 DIAGNOSIS — R6520 Severe sepsis without septic shock: Secondary | ICD-10-CM | POA: Diagnosis not present

## 2023-01-24 DIAGNOSIS — R531 Weakness: Secondary | ICD-10-CM

## 2023-01-24 DIAGNOSIS — Z515 Encounter for palliative care: Secondary | ICD-10-CM

## 2023-01-24 DIAGNOSIS — K5710 Diverticulosis of small intestine without perforation or abscess without bleeding: Secondary | ICD-10-CM | POA: Diagnosis present

## 2023-01-24 DIAGNOSIS — R54 Age-related physical debility: Secondary | ICD-10-CM | POA: Diagnosis not present

## 2023-01-24 DIAGNOSIS — A419 Sepsis, unspecified organism: Secondary | ICD-10-CM | POA: Diagnosis not present

## 2023-01-24 DIAGNOSIS — M109 Gout, unspecified: Secondary | ICD-10-CM | POA: Diagnosis present

## 2023-01-24 HISTORY — PX: IR EXCHANGE BILIARY DRAIN: IMG6046

## 2023-01-24 LAB — COMPREHENSIVE METABOLIC PANEL
ALT: 136 U/L — ABNORMAL HIGH (ref 0–44)
AST: 144 U/L — ABNORMAL HIGH (ref 15–41)
Albumin: 2.5 g/dL — ABNORMAL LOW (ref 3.5–5.0)
Alkaline Phosphatase: 436 U/L — ABNORMAL HIGH (ref 38–126)
Anion gap: 11 (ref 5–15)
BUN: 18 mg/dL (ref 8–23)
CO2: 23 mmol/L (ref 22–32)
Calcium: 8.6 mg/dL — ABNORMAL LOW (ref 8.9–10.3)
Chloride: 93 mmol/L — ABNORMAL LOW (ref 98–111)
Creatinine, Ser: 0.81 mg/dL (ref 0.61–1.24)
GFR, Estimated: >60 mL/min (ref >60)
Glucose, Bld: 133 mg/dL — ABNORMAL HIGH (ref 70–99)
Potassium: 3.8 mmol/L (ref 3.5–5.1)
Sodium: 127 mmol/L — ABNORMAL LOW (ref 135–145)
Total Bilirubin: 20.9 mg/dL (ref <1.2)
Total Protein: 6 g/dL — ABNORMAL LOW (ref 6.5–8.1)

## 2023-01-24 LAB — URINALYSIS, ROUTINE W REFLEX MICROSCOPIC
Glucose, UA: NEGATIVE mg/dL
Hgb urine dipstick: NEGATIVE
Ketones, ur: NEGATIVE mg/dL
Leukocytes,Ua: NEGATIVE
Nitrite: NEGATIVE
Protein, ur: NEGATIVE mg/dL
Specific Gravity, Urine: 1.017 (ref 1.005–1.030)
pH: 6 (ref 5.0–8.0)

## 2023-01-24 LAB — CBC WITH DIFFERENTIAL/PLATELET
Abs Immature Granulocytes: 0.03 10*3/uL (ref 0.00–0.07)
Basophils Absolute: 0.1 10*3/uL (ref 0.0–0.1)
Basophils Relative: 1 %
Eosinophils Absolute: 0.1 10*3/uL (ref 0.0–0.5)
Eosinophils Relative: 2 %
HCT: 29.8 % — ABNORMAL LOW (ref 39.0–52.0)
Hemoglobin: 10.3 g/dL — ABNORMAL LOW (ref 13.0–17.0)
Immature Granulocytes: 1 %
Lymphocytes Relative: 20 %
Lymphs Abs: 1 10*3/uL (ref 0.7–4.0)
MCH: 33.2 pg (ref 26.0–34.0)
MCHC: 34.6 g/dL (ref 30.0–36.0)
MCV: 96.1 fL (ref 80.0–100.0)
Monocytes Absolute: 0.7 10*3/uL (ref 0.1–1.0)
Monocytes Relative: 15 %
Neutro Abs: 3.1 10*3/uL (ref 1.7–7.7)
Neutrophils Relative %: 61 %
Platelets: 189 10*3/uL (ref 150–400)
RBC: 3.1 MIL/uL — ABNORMAL LOW (ref 4.22–5.81)
RDW: 20.7 % — ABNORMAL HIGH (ref 11.5–15.5)
WBC: 5 10*3/uL (ref 4.0–10.5)
nRBC: 0 % (ref 0.0–0.2)

## 2023-01-24 LAB — VALPROIC ACID LEVEL: Valproic Acid Lvl: 55 ug/mL (ref 50.0–100.0)

## 2023-01-24 LAB — LIPASE, BLOOD: Lipase: 1228 U/L — ABNORMAL HIGH (ref 11–51)

## 2023-01-24 MED ORDER — GADOBUTROL 1 MMOL/ML IV SOLN
8.5000 mL | Freq: Once | INTRAVENOUS | Status: AC | PRN
  Administered 2023-01-24: 8.5 mL via INTRAVENOUS

## 2023-01-24 MED ORDER — AMLODIPINE BESYLATE 5 MG PO TABS
5.0000 mg | ORAL_TABLET | Freq: Every day | ORAL | Status: DC
  Administered 2023-01-25 – 2023-01-29 (×5): 5 mg via ORAL
  Filled 2023-01-24 (×6): qty 1

## 2023-01-24 MED ORDER — DIVALPROEX SODIUM 250 MG PO DR TAB
750.0000 mg | DELAYED_RELEASE_TABLET | Freq: Two times a day (BID) | ORAL | Status: DC
  Administered 2023-01-24 – 2023-02-24 (×62): 750 mg via ORAL
  Filled 2023-01-24 (×2): qty 3
  Filled 2023-01-24 (×3): qty 1
  Filled 2023-01-24 (×2): qty 3
  Filled 2023-01-24: qty 1
  Filled 2023-01-24 (×4): qty 3
  Filled 2023-01-24 (×4): qty 1
  Filled 2023-01-24: qty 3
  Filled 2023-01-24: qty 1
  Filled 2023-01-24 (×2): qty 3
  Filled 2023-01-24: qty 1
  Filled 2023-01-24 (×2): qty 3
  Filled 2023-01-24: qty 1
  Filled 2023-01-24: qty 3
  Filled 2023-01-24 (×6): qty 1
  Filled 2023-01-24: qty 3
  Filled 2023-01-24 (×2): qty 1
  Filled 2023-01-24: qty 3
  Filled 2023-01-24: qty 1
  Filled 2023-01-24 (×3): qty 3
  Filled 2023-01-24 (×4): qty 1
  Filled 2023-01-24: qty 3
  Filled 2023-01-24 (×2): qty 1
  Filled 2023-01-24: qty 3
  Filled 2023-01-24 (×3): qty 1
  Filled 2023-01-24: qty 3
  Filled 2023-01-24 (×8): qty 1
  Filled 2023-01-24: qty 3
  Filled 2023-01-24: qty 1
  Filled 2023-01-24: qty 3
  Filled 2023-01-24: qty 1

## 2023-01-24 MED ORDER — SODIUM CHLORIDE 0.9 % IV BOLUS
500.0000 mL | Freq: Once | INTRAVENOUS | Status: AC
  Administered 2023-01-24: 500 mL via INTRAVENOUS

## 2023-01-24 MED ORDER — LIDOCAINE-EPINEPHRINE 1 %-1:100000 IJ SOLN
INTRAMUSCULAR | Status: AC
  Filled 2023-01-24: qty 1

## 2023-01-24 MED ORDER — GABAPENTIN 300 MG PO CAPS
300.0000 mg | ORAL_CAPSULE | Freq: Every day | ORAL | Status: DC
  Administered 2023-01-24 – 2023-02-01 (×9): 300 mg via ORAL
  Filled 2023-01-24 (×9): qty 1

## 2023-01-24 MED ORDER — SODIUM CHLORIDE 0.9 % IV SOLN: INTRAVENOUS | Status: AC

## 2023-01-24 MED ORDER — POLYETHYLENE GLYCOL 3350 17 G PO PACK: 17.0000 g | PACK | Freq: Every day | ORAL | Status: DC | PRN

## 2023-01-24 MED ORDER — METOPROLOL SUCCINATE ER 25 MG PO TB24
25.0000 mg | ORAL_TABLET | Freq: Every day | ORAL | Status: DC
  Administered 2023-01-25 – 2023-02-01 (×7): 25 mg via ORAL
  Filled 2023-01-24 (×8): qty 1

## 2023-01-24 MED ORDER — DICLOFENAC SODIUM 1 % EX GEL
2.0000 g | Freq: Four times a day (QID) | CUTANEOUS | Status: DC | PRN
Start: 2023-01-24 — End: 2023-02-24
  Filled 2023-01-24: qty 100

## 2023-01-24 MED ORDER — LIDOCAINE-EPINEPHRINE 1 %-1:100000 IJ SOLN
20.0000 mL | Freq: Once | INTRAMUSCULAR | Status: AC
  Administered 2023-01-24: 6 mL via INTRADERMAL

## 2023-01-24 MED ORDER — IOHEXOL 300 MG/ML  SOLN
50.0000 mL | Freq: Once | INTRAMUSCULAR | Status: AC | PRN
  Administered 2023-01-24: 15 mL

## 2023-01-24 MED ORDER — ACETAMINOPHEN 650 MG RE SUPP: 650.0000 mg | Freq: Four times a day (QID) | RECTAL | Status: DC | PRN

## 2023-01-24 MED ORDER — ACETAMINOPHEN 325 MG PO TABS
650.0000 mg | ORAL_TABLET | Freq: Four times a day (QID) | ORAL | Status: DC | PRN
  Administered 2023-01-26 – 2023-02-02 (×5): 650 mg via ORAL
  Filled 2023-01-24 (×6): qty 2

## 2023-01-24 MED ORDER — SODIUM CHLORIDE 0.9% FLUSH
3.0000 mL | Freq: Two times a day (BID) | INTRAVENOUS | Status: DC
  Administered 2023-01-24 – 2023-02-24 (×57): 3 mL via INTRAVENOUS

## 2023-01-24 NOTE — ED Provider Notes (Signed)
[facility] AT [facility] Provider Note   CSN: [id number] Arrival date & time: 01/24/23  6213     History  Chief Complaint  Patient presents with   Jaundice   Fatigue    Samuel Mayo is a 77 y.o. male.  Pt is a 77 yo male with pmhx significant for seizures, htn, PAF (on Eliquis), and osteoporosis.  Pt was in the hospital from 10/28-11/1 with cholecystitis and was also diagnosed with Mirizzi syndrome.  He did f/u with pcp on 11/11 and LFTs were worsening.  Pt had a percutaneous cholecystotomy placed by IR on 10/31.  He went to IR today and he had a drain study.  This showed persistent occlusion of cystic duct and no opacification of CBD.  He was told by the radiologist that he needed to come to the ED since his jaundice is worsening.  Pt denies any pain.  He said he's been more tired than usual.  His appetite has been poor.       Home Medications Prior to Admission medications   Medication Sig Start Date End Date Taking? Authorizing Provider  amLODipine (NORVASC) 5 MG tablet TAKE 1 TABLET (5 MG TOTAL) BY MOUTH DAILY. 01/11/22  Yes [name], [name], MD  Cholecalciferol (VITAMIN D-3 PO) Take 1 capsule by mouth every evening.   Yes [provider]  divalproex (DEPAKOTE) 250 MG DR tablet Take 1 tablet (250 mg total) by mouth 2 (two) times daily. Take in addition to 500 mg twice a day (pt should have a total of 750 mg twice a day) 08/26/22  Yes Micki Riley, MD  divalproex (DEPAKOTE) 500 MG DR tablet Take 1 tablet (500 mg total) by mouth 2 (two) times daily. 05/20/22  Yes Micki Riley, MD  ELIQUIS 5 MG TABS tablet TAKE 1 TABLET BY MOUTH TWICE A DAY 08/07/22  Yes Mealor, Roberts Gaudy, MD  gabapentin (NEURONTIN) 300 MG capsule Take 1 capsule (300 mg total) by mouth at bedtime. 09/16/22  Yes Micki Riley, MD  ibandronate (BONIVA) 150 MG tablet TAKE 1 TABLET BY MOUTH EVERY 30 DAYS. TAKE IN AM WITH FULL GLASS OF WATER ON EMPTY STOMACH AND DON'T  TAKE ANYTHING ELSE BY MOUTH OR LIE DOWN FOR THE NEXT 30 MINUTES Patient taking differently: Take 150 mg by mouth See admin instructions. Take 1 tablet (150mg ) once a month around the 20th. 07/22/22  Yes [name], [name], MD  LUTEIN PO Take 1 tablet by mouth every evening.   Yes [provider]  metoprolol succinate (TOPROL-XL) 25 MG 24 hr tablet TAKE 1 TABLET (25 MG TOTAL) BY MOUTH DAILY. 09/23/22  Yes BranchAlben Spittle, MD  Multiple Vitamin (MULTIVITAMIN WITH MINERALS) TABS tablet Take 1 tablet by mouth every evening.   Yes [provider]  OVER THE COUNTER MEDICATION Take 1 tablet by mouth 2 (two) times daily. Bone Up   Yes [provider]  sodium chloride flush 0.9 % SOLN injection 10 mLs by Intracatheter route as needed. Flush percutaneous cholecystostomy drain with 10 ml normal saline once daily. 01/03/23 02/02/23 Yes Mickie Kay, NP  amoxicillin-clavulanate (AUGMENTIN) 875-125 MG tablet Take 1 tablet by mouth 2 (two) times daily. Patient not taking: Reported on 01/24/2023    [provider]      Allergies    Other    Review of Systems   Review of Systems  Neurological:  Positive for weakness.  All other systems reviewed and are negative.  Physical Exam Updated Vital Signs BP 114/80   Pulse 71   Temp 98.1 F (36.7 C) (Oral)   Resp (!) 21   Ht 5\' 11"  (1.803 m)   Wt 86.2 kg   SpO2 99%   BMI 26.50 kg/m  Physical Exam Vitals and nursing note reviewed.  Constitutional:      Appearance: Normal appearance.  HENT:     Head: Normocephalic and atraumatic.     Right Ear: External ear normal.     Left Ear: External ear normal.     Nose: Nose normal.     Mouth/Throat:     Mouth: Mucous membranes are dry.  Eyes:     General: Scleral icterus present.     Extraocular Movements: Extraocular movements intact.     Conjunctiva/sclera: Conjunctivae normal.     Pupils: Pupils are equal, round, and reactive to light.  Cardiovascular:     Rate and  Rhythm: Normal rate and regular rhythm.     Pulses: Normal pulses.     Heart sounds: Normal heart sounds.  Pulmonary:     Effort: Pulmonary effort is normal.     Breath sounds: Normal breath sounds.  Abdominal:     General: Abdomen is flat. Bowel sounds are normal.     Palpations: Abdomen is soft.  Musculoskeletal:        General: Normal range of motion.     Cervical back: Normal range of motion and neck supple.  Skin:    Capillary Refill: Capillary refill takes less than 2 seconds.     Coloration: Skin is jaundiced.  Neurological:     General: No focal deficit present.     Mental Status: He is alert and oriented to person, place, and time.  Psychiatric:        Mood and Affect: Mood normal.        Behavior: Behavior normal.     ED Results / Procedures / Treatments   Labs (all labs ordered are listed, but only abnormal results are displayed) Labs Reviewed  COMPREHENSIVE METABOLIC PANEL - Abnormal; Notable for the following components:      Result Value   Sodium 127 (*)    Chloride 93 (*)    Glucose, Bld 133 (*)    Calcium 8.6 (*)    Total Protein 6.0 (*)    Albumin 2.5 (*)    AST 144 (*)    ALT 136 (*)    Alkaline Phosphatase 436 (*)    Total Bilirubin 20.9 (*)    All other components within normal limits  LIPASE, BLOOD - Abnormal; Notable for the following components:   Lipase 1,228 (*)    All other components within normal limits  URINALYSIS, ROUTINE W REFLEX MICROSCOPIC - Abnormal; Notable for the following components:   Color, Urine AMBER (*)    Bilirubin Urine MODERATE (*)    All other components within normal limits  CBC WITH DIFFERENTIAL/PLATELET - Abnormal; Notable for the following components:   RBC 3.10 (*)    Hemoglobin 10.3 (*)    HCT 29.8 (*)    RDW 20.7 (*)    All other components within normal limits  VALPROIC ACID LEVEL  CBC WITH DIFFERENTIAL/PLATELET    EKG EKG Interpretation Date/Time:  Friday January 24 2023 10:51:19 EST Ventricular  Rate:  80 PR Interval:  284 QRS Duration:  126 QT Interval:  372 QTC Calculation: 430 R Axis:   -27  Text Interpretation: Sinus rhythm Paired ventricular premature complexes Prolonged PR  interval Left bundle branch block PVCs are new Confirmed by Jacalyn Lefevre [phone]) on 01/24/2023 11:44:46 AM  Radiology IR EXCHANGE BILIARY DRAIN  Result Date: 01/24/2023 INDICATION: History of acute cholecystitis and 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, post ultrasound fluoroscopic guided cholecystostomy tube placement on 01/02/2023. Patient now with worsening hyperbilirubinemia and jaundice and as such presents for cholangiogram and potential cholecystostomy tube exchange. EXAM: FLUOROSCOPIC GUIDED CHOLECYSTOSTOMY TUBE EXCHANGE COMPARISON:  MRCP-12/29/2022 Image guided cholecystostomy tube placement-01/02/2023 MEDICATIONS: None ANESTHESIA/SEDATION: None CONTRAST:  15mL OMNIPAQUE IOHEXOL 300 MG/ML SOLN - administered into the gallbladder lumen. FLUOROSCOPY TIME:  1 minute, 6 seconds (6.1 mGy) COMPLICATIONS: None immediate. PROCEDURE: The patient was positioned supine on the fluoroscopy table. The external portion of the existing cholecystostomy tube as well as the surrounding skin was prepped and draped in usual sterile fashion. A time-out was performed prior to the initiation of the procedure. A preprocedural spot fluoroscopic image was obtained of the right upper abdominal quadrant existing cholecystostomy tube. The skin surrounding the cholecystostomy tube was anesthetized with 1% lidocaine with epinephrine. The external portion of the cholecystostomy tube was cut and cannulated with a short Amplatz wire which was advanced through the tube and coiled within the gallbladder lumen Next, under intermittent fluoroscopic guidance, the existing 10 [place] cholecystostomy tube was exchanged for a new, slightly larger now 10 [place] cholecystostomy tube which was repositioned into the more central aspect of the gallbladder lumen.  Contrast injection confirms appropriate positioning and functionality of the cholecystomy tube. The cholecystostomy tube was flushed with a small amount of saline and reconnected to a gravity bag. The cholecystostomy tube was secured with an interrupted suture and a Stat Lock device. A dressing was applied. The patient tolerated the procedure well without immediate postprocedural complication. FINDINGS: Preprocedural spot fluoroscopic image demonstrates unchanged positioning of cholecystostomy tube with end coiled and locked over the expected location of the fundus of the gallbladder. Contrast injection demonstrates appropriate positioning and functionality of the existing cholecystostomy tube. There is opacification of the peripheral aspect of the cystic duct without passage of contrast to the level of the CBD. After fluoroscopic guided exchange, the new, 10 [place] cholecystostomy tube is appropriately positioned within the gallbladder lumen. Post exchange cholangiogram demonstrates appropriate positioning and functionality of the new cholecystostomy tube. IMPRESSION: Successful fluoroscopic guided exchange of 10 French cholecystostomy tube. PLAN: - Cholangiogram demonstrates persistent occlusion of the cystic duct without opacification of the CBD. 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 The patient is visibly jaundiced with worsening hyperbilirubinemia (bilirubin - 3.9 on 11/11, 0.6 on the day of cholecystostomy tube placement) and as such the decision was made to escort the patient to the emergency department for further evaluation and management as the patient will likely require an ERCP to relieve his worsening biliary obstruction. Electronically Signed   By: Simonne Come M.D.   On: 01/24/2023 11:06    Procedures Procedures    Medications Ordered in ED Medications  amLODipine (NORVASC) tablet 5 mg (has no  administration in time range)  metoprolol succinate (TOPROL-XL) 24 hr tablet 25 mg (has no administration in time range)  divalproex (DEPAKOTE) DR tablet 750 mg (has no administration in time range)  gabapentin (NEURONTIN) capsule 300 mg (has no administration in time range)  sodium chloride flush (NS) 0.9 % injection 3 mL (has no administration in time range)  acetaminophen (TYLENOL) tablet 650 mg (has no administration in time range)    Or  acetaminophen (TYLENOL)  suppository 650 mg (has no administration in time range)  polyethylene glycol (MIRALAX / GLYCOLAX) packet 17 g (has no administration in time range)  sodium chloride 0.9 % bolus 500 mL (0 mLs Intravenous Stopped 01/24/23 1131)    ED Course/ Medical Decision Making/ A&P                                 Medical Decision Making Amount and/or Complexity of Data Reviewed Labs: ordered. Radiology: ordered.   This patient presents to the ED for concern of jaundice, this involves an extensive number of treatment options, and is a complaint that carries with it a high risk of complications and morbidity.  The differential diagnosis includes infection, choledocholithiasis, mass   Co morbidities that complicate the patient evaluation  eizures, htn, PAF (on Eliquis), and osteoporosis   Additional history obtained:  Additional history obtained from epic chart review External records from outside source obtained and reviewed including wife   Lab Tests:  I Ordered, and personally interpreted labs.  The pertinent results include:  cbc with hgb 10.3, ua + bilirubin; CMP with elevated LFTs and worsening bilirubin 20.9 ( bili 3.9 on 11/11); lipase elevated at 1228   Imaging Studies ordered:  I ordered imaging studies including MRCP  Pending on admission   Cardiac Monitoring:  The patient was maintained on a cardiac monitor.  I personally viewed and interpreted the cardiac monitored which showed an underlying rhythm of:  nsr   Medicines ordered and prescription drug management:  I ordered medication including ivfs  for sx  Reevaluation of the patient after these medicines showed that the patient improved I have reviewed the patients home medicines and have made adjustments as needed   Test Considered:  mrcp   Critical Interventions:  mrcp   Consultations Obtained:  I requested consultation with general surgery,  and discussed lab and imaging findings as well as pertinent plan -they did see pt in the ED and recommend MRCP Pt d/w Dr. Alinda Money ([facility]) for admission.   Problem List / ED Course:  Worsening jaundice.  MRCP pending.  Lipase is now high which was not the case during admission.  Surgery has been consulted.  Pt will need admission.   Reevaluation:  After the interventions noted above, I reevaluated the patient and found that they have :improved   Social Determinants of Health:  Lives at home   Dispostion:  After consideration of the diagnostic results and the patients response to treatment, I feel that the patent would benefit from admission.          Final Clinical Impression(s) / ED Diagnoses Final diagnoses:  Acute biliary pancreatitis, unspecified complication status  Cholecystitis    Rx / DC Orders ED Discharge Orders     None         Jacalyn Lefevre, MD 01/24/23 1342

## 2023-01-24 NOTE — ED Triage Notes (Signed)
Pt to ED via pov from radiology (Dr. Grace Isaac) where he was having an exam of gallbladder for jaundice. Pt was told to come to ED because his gallbladder looked fine and they are concerned about his liver. Pt states he has had more fatigue than usual for past couple of months. Pt has had worsening jaundice for past 3 months. Pt denies pain.

## 2023-01-24 NOTE — Progress Notes (Signed)
Progress Note     Subjective: Patient known to our service with discharge 11/1 after treatment of acute cholecystitis and possible Mirizzi syndrome with IR perc chole and abx. Patient followed up with PCP since discharge and found to have hyperbilirubinemia and worsening transanimitis. Saw IR today for perc chole exchange with cystic duct still occluded and notable worsening jaundice. He was told to proceed to ED for further eval.  Since discharge he has had more fatigue and been less active. He has low appetite. He denies abdominal pain, nausea, vomiting, fever, chills. He has had no issues with perc chole which has been draining yellow fluid consistently since discharge  Significant other at bedside    Objective: Vital signs in last 24 hours: Temp:  [98.1 F (36.7 C)] 98.1 F (36.7 C) (11/22 0958) Pulse Rate:  [71-92] 71 (11/22 1230) Resp:  [16-21] 21 (11/22 1230) BP: (103-118)/(74-83) 114/80 (11/22 1230) SpO2:  [98 %-99 %] 99 % (11/22 1230) Weight:  [86.2 kg] 86.2 kg (11/22 0953)    Intake/Output from previous day: No intake/output data recorded. Intake/Output this shift: No intake/output data recorded.  PE: General: pleasant, WD, male who is laying in bed in NAD Heart: regular, rate, and rhythm.   Lungs:  Respiratory effort nonlabored Abd: soft, NT, ND, per chole with scant SS drainage MSK: all 4 extremities are symmetrical with no cyanosis, clubbing, or edema. Skin: warm and dry. Jaundiced Psych: A&Ox3 with an appropriate affect.    Lab Results:  Recent Labs    01/24/23 1020  WBC 5.0  HGB 10.3*  HCT 29.8*  PLT 189   BMET No results for input(s): "NA", "K", "CL", "CO2", "GLUCOSE", "BUN", "CREATININE", "CALCIUM" in the last 72 hours. PT/INR No results for input(s): "LABPROT", "INR" in the last 72 hours. CMP     Component Value Date/Time   NA 132 (L) 01/13/2023 0901   NA 141 05/20/2022 1026   NA 136 02/03/2012 1915   K 4.1 01/13/2023 0901   K 3.8  02/03/2012 1915   CL 95 (L) 01/13/2023 0901   CL 102 02/03/2012 1915   CO2 28 01/13/2023 0901   CO2 27 02/03/2012 1915   GLUCOSE 134 (H) 01/13/2023 0901   GLUCOSE 87 02/03/2012 1915   BUN 20 01/13/2023 0901   BUN 24 05/20/2022 1026   BUN 18 02/03/2012 1915   CREATININE 1.09 01/13/2023 0901   CREATININE 0.90 02/03/2012 1915   CALCIUM 8.9 01/13/2023 0901   CALCIUM 8.2 (L) 02/03/2012 1915   PROT 6.1 01/13/2023 0901   PROT 5.9 (L) 05/20/2022 1026   PROT 6.7 02/03/2012 1915   ALBUMIN 3.4 (L) 01/13/2023 0901   ALBUMIN 3.9 05/20/2022 1026   ALBUMIN 3.9 02/03/2012 1915   AST 135 (H) 01/13/2023 0901   AST 31 02/03/2012 1915   ALT 150 (H) 01/13/2023 0901   ALT 31 02/03/2012 1915   ALKPHOS 694 (H) 01/13/2023 0901   ALKPHOS 75 02/03/2012 1915   BILITOT 3.9 (H) 01/13/2023 0901   BILITOT 0.4 05/20/2022 1026   BILITOT 0.5 02/03/2012 1915   GFRNONAA >60 01/03/2023 0807   GFRNONAA >60 02/03/2012 1915   GFRAA >60 10/07/2019 1330   GFRAA >60 02/03/2012 1915   Lipase     Component Value Date/Time   LIPASE 49 12/30/2022 1600       Studies/Results: IR EXCHANGE BILIARY DRAIN  Result Date: 01/24/2023 INDICATION: History of acute cholecystitis and 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, post ultrasound fluoroscopic guided cholecystostomy tube placement on 01/02/2023. Patient now  with worsening hyperbilirubinemia and jaundice and as such presents for cholangiogram and potential cholecystostomy tube exchange. EXAM: FLUOROSCOPIC GUIDED CHOLECYSTOSTOMY TUBE EXCHANGE COMPARISON:  MRCP-12/29/2022 Image guided cholecystostomy tube placement-01/02/2023 MEDICATIONS: None ANESTHESIA/SEDATION: None CONTRAST:  15mL OMNIPAQUE IOHEXOL 300 MG/ML SOLN - administered into the gallbladder lumen. FLUOROSCOPY TIME:  1 minute, 6 seconds (6.1 mGy) COMPLICATIONS: None immediate. PROCEDURE: The patient was positioned supine on the fluoroscopy table. The external portion of the existing cholecystostomy tube as well as the  surrounding skin was prepped and draped in usual sterile fashion. A time-out was performed prior to the initiation of the procedure. A preprocedural spot fluoroscopic image was obtained of the right upper abdominal quadrant existing cholecystostomy tube. The skin surrounding the cholecystostomy tube was anesthetized with 1% lidocaine with epinephrine. The external portion of the cholecystostomy tube was cut and cannulated with a short Amplatz wire which was advanced through the tube and coiled within the gallbladder lumen Next, under intermittent fluoroscopic guidance, the existing 10 [place] cholecystostomy tube was exchanged for a new, slightly larger now 10 [place] cholecystostomy tube which was repositioned into the more central aspect of the gallbladder lumen. Contrast injection confirms appropriate positioning and functionality of the cholecystomy tube. The cholecystostomy tube was flushed with a small amount of saline and reconnected to a gravity bag. The cholecystostomy tube was secured with an interrupted suture and a Stat Lock device. A dressing was applied. The patient tolerated the procedure well without immediate postprocedural complication. FINDINGS: Preprocedural spot fluoroscopic image demonstrates unchanged positioning of cholecystostomy tube with end coiled and locked over the expected location of the fundus of the gallbladder. Contrast injection demonstrates appropriate positioning and functionality of the existing cholecystostomy tube. There is opacification of the peripheral aspect of the cystic duct without passage of contrast to the level of the CBD. After fluoroscopic guided exchange, the new, 10 [place] cholecystostomy tube is appropriately positioned within the gallbladder lumen. Post exchange cholangiogram demonstrates appropriate positioning and functionality of the new cholecystostomy tube. IMPRESSION: Successful fluoroscopic guided exchange of 10 French cholecystostomy tube. PLAN: -  Cholangiogram demonstrates persistent occlusion of the cystic duct without opacification of the CBD. 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 The patient is visibly jaundiced with worsening hyperbilirubinemia (bilirubin - 3.9 on 11/11, 0.6 on the day of cholecystostomy tube placement) and as such the decision was made to escort the patient to the emergency department for further evaluation and management as the patient will likely require an ERCP to relieve his worsening biliary obstruction. Electronically Signed   By: Simonne Come M.D.   On: 01/24/2023 11:06    Anti-infectives: Anti-infectives (From admission, onward)    None        Assessment/Plan 77 y/o M w/ a hx of MVR w/ annuloplasty, Afib on Eliquis, HTN, and a seizure disorder  - Admitted 10/28-11/1 with findings of acute cholecystitis w/ an adjacent abscess and possible Mirizzi syndrome.  - S/p IR drain placement 10/31  - drain study/exchange today by IR with persistent occlusion of cystic duct and no opacification of CBD - worsening hyperbilirubinemia and elevated LFTs as of 11/11 OP f/u with t bili 3.9. repeat today pending  - recommend MRCP for further evaluation of biliary tree/CBD. May need ERCP - No indication for urgent surgical intervention at this time   FEN: NPO while awaiting labs/MRCP, likely okay for diet following that ID: none currently VTE: SCDs  I reviewed last 24 h vitals and  pain scores, last 48 h intake and output, last 24 h labs and trends, and last 24 h imaging results.    LOS: 0 days   Eric Form, [facility] 01/24/2023, 12:41 PM Please see Amion for pager number during day hours 7:00am-4:30pm

## 2023-01-24 NOTE — H&P (Signed)
History and Physical   Samuel Mayo ZOX:[id number] DOB: 07-16-1945 DOA: 01/24/2023  PCP: [name], [name], MD   Patient coming from: Home/IR  Chief Complaint: Worsening jaundice  HPI: Samuel Mayo is a 77 y.o. male with medical history significant of hypertension, atrial fibrillation, seizures, gout, MVR status post annuloplasty presenting with worsening jaundice.  Patient was admitted from 10/28 until 11/1.  Had cholecystitis but underwent cholecystostomy tube in setting of cholecystectomy.  Diagnosed with likely Mirizzi syndrome as there is no obstructing stone noted.  Follow-up outpatient and have worsening LFTs.  Was reevaluated by IR and was seen today for reevaluation of gallbladder and possible cholecystostomy tube exchange.  During imaging today patient noted to have persistent occlusion of the cystic duct with out opacification consistent with 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.  Given worsening laboratory abnormalities patient recommended to proceed to the ED for further evaluation.  Patient reports some lethargy and decreased appetite but denies fever, chills, chest pain, shortness of breath, abdominal pain, constipation, diarrhea, nausea, vomiting.  ED Course: Vital signs in the ED stable.  Lab workup included CMP with sodium at 127 near baseline of 1 20-1 33, chloride 93, glucose 133, calcium 8.6, protein 6.0, albumin 2.5, AST 144, ALT 136, alk phos 436, T. bili 20.9.  CBC with hemoglobin stable 10.3.  Lipase elevated to 1228.  Urinalysis positive for bilirubin.  Valproic acid pending.  Patient is a 5 5 cc bolus in ED.  General surgery consulted and recommending MRCP prior to determining next steps.  Review of Systems: As per HPI otherwise all other systems reviewed and are negative.  Past Medical History:  Diagnosis Date   AK (actinic keratosis)    Allergy    Chicken pox    Compression fracture of L1 lumbar vertebra (HCC)    Compression fracture of thoracic vertebra  (HCC)    Hypertension    Lentigines    Follows with DR Culton (derma)   Seizures (HCC)     Past Surgical History:  Procedure Laterality Date   CARDIOVERSION N/A 05/14/2021   Procedure: CARDIOVERSION;  Surgeon: Little Ishikawa, MD;  Location[facility] ENDOSCOPY;  Service: Cardiovascular;  Laterality: N/A;   CARDIOVERSION N/A 10/08/2021   Procedure: CARDIOVERSION;  Surgeon: Christell Constant, MD;  Location: [facility] ENDOSCOPY;  Service: Cardiovascular;  Laterality: N/A;   COLONOSCOPY     IR EXCHANGE BILIARY DRAIN  01/24/2023   IR PERC CHOLECYSTOSTOMY  01/02/2023   mitral vavle     Congetinal mitral valve disease.    Social History  reports that he has never smoked. He has never used smokeless tobacco. He reports current alcohol use of about 14.0 standard drinks of alcohol per week. He reports that he does not use drugs.  Allergies  Allergen Reactions   Other Other (See Comments)    Childhood reaction to Neosporin Ophthalmic ointment caused severe eye redness, irritation    Family History  Problem Relation Age of Onset   Heart disease Mother    Cancer Father    Heart disease Father    Prostate cancer Father    Aneurysm Paternal Grandfather    Heart disease Son        congenital mitral valve dz   Colon cancer Neg Hx    Esophageal cancer Neg Hx    Stomach cancer Neg Hx    Rectal cancer Neg Hx   Reviewed on admission  Prior to Admission medications   Medication Sig Start Date End Date Taking? Authorizing Provider  amLODipine (NORVASC) 5 MG tablet TAKE 1 TABLET (5 MG TOTAL) BY MOUTH DAILY. 01/11/22  Yes [name], [name], MD  Cholecalciferol (VITAMIN D-3 PO) Take 1 capsule by mouth every evening.   Yes [provider]  divalproex (DEPAKOTE) 250 MG DR tablet Take 1 tablet (250 mg total) by mouth 2 (two) times daily. Take in addition to 500 mg twice a day (pt should have a total of 750 mg twice a day) 08/26/22  Yes Micki Riley, MD  divalproex (DEPAKOTE) 500 MG DR  tablet Take 1 tablet (500 mg total) by mouth 2 (two) times daily. 05/20/22  Yes Micki Riley, MD  ELIQUIS 5 MG TABS tablet TAKE 1 TABLET BY MOUTH TWICE A DAY 08/07/22  Yes Mealor, Roberts Gaudy, MD  gabapentin (NEURONTIN) 300 MG capsule Take 1 capsule (300 mg total) by mouth at bedtime. 09/16/22  Yes Micki Riley, MD  ibandronate (BONIVA) 150 MG tablet TAKE 1 TABLET BY MOUTH EVERY 30 DAYS. TAKE IN AM WITH FULL GLASS OF WATER ON EMPTY STOMACH AND DON'T TAKE ANYTHING ELSE BY MOUTH OR LIE DOWN FOR THE NEXT 30 MINUTES Patient taking differently: Take 150 mg by mouth See admin instructions. Take 1 tablet (150mg ) once a month around the 20th. 07/22/22  Yes [name], [name], MD  LUTEIN PO Take 1 tablet by mouth every evening.   Yes [provider]  metoprolol succinate (TOPROL-XL) 25 MG 24 hr tablet TAKE 1 TABLET (25 MG TOTAL) BY MOUTH DAILY. 09/23/22  Yes BranchAlben Spittle, MD  Multiple Vitamin (MULTIVITAMIN WITH MINERALS) TABS tablet Take 1 tablet by mouth every evening.   Yes [provider]  OVER THE COUNTER MEDICATION Take 1 tablet by mouth 2 (two) times daily. Bone Up   Yes [provider]  sodium chloride flush 0.9 % SOLN injection 10 mLs by Intracatheter route as needed. Flush percutaneous cholecystostomy drain with 10 ml normal saline once daily. 01/03/23 02/02/23 Yes Mickie Kay, NP  amoxicillin-clavulanate (AUGMENTIN) 875-125 MG tablet Take 1 tablet by mouth 2 (two) times daily. Patient not taking: Reported on 01/24/2023    [provider]    Physical Exam: Vitals:   01/24/23 0953 01/24/23 0958 01/24/23 1130 01/24/23 1230  BP:  118/83 103/74 114/80  Pulse:  92 74 71  Resp:  16 16 (!) 21  Temp:  98.1 F (36.7 C)    TempSrc:  Oral    SpO2:  98% 98% 99%  Weight: 86.2 kg     Height: 5\' 11"  (1.803 m)       Physical Exam Constitutional:      General: He is not in acute distress.    Appearance: Normal appearance.  HENT:     Head: Normocephalic and  atraumatic.     Mouth/Throat:     Mouth: Mucous membranes are moist.     Pharynx: Oropharynx is clear.  Eyes:     General: Scleral icterus present.     Extraocular Movements: Extraocular movements intact.     Pupils: Pupils are equal, round, and reactive to light.  Cardiovascular:     Rate and Rhythm: Normal rate and regular rhythm.     Pulses: Normal pulses.     Heart sounds: Normal heart sounds.  Pulmonary:     Effort: Pulmonary effort is normal. No respiratory distress.     Breath sounds: Normal breath sounds.  Abdominal:     General: Bowel sounds are normal. There is no distension.  Palpations: Abdomen is soft.     Tenderness: There is no abdominal tenderness.  Musculoskeletal:        General: No swelling or deformity.  Skin:    General: Skin is warm and dry.     Coloration: Skin is jaundiced.  Neurological:     General: No focal deficit present.     Mental Status: Mental status is at baseline.    Labs on Admission: I have personally reviewed following labs and imaging studies  CBC: Recent Labs  Lab 01/24/23 1020  WBC 5.0  NEUTROABS 3.1  HGB 10.3*  HCT 29.8*  MCV 96.1  PLT 189    Basic Metabolic Panel: Recent Labs  Lab 01/24/23 1020  NA 127*  K 3.8  CL 93*  CO2 23  GLUCOSE 133*  BUN 18  CREATININE 0.81  CALCIUM 8.6*    GFR: Estimated Creatinine Clearance: 80.1 mL/min (by C-G formula based on SCr of 0.81 mg/dL).  Liver Function Tests: Recent Labs  Lab 01/24/23 1020  AST 144*  ALT 136*  ALKPHOS 436*  BILITOT 20.9*  PROT 6.0*  ALBUMIN 2.5*    Urine analysis:    Component Value Date/Time   COLORURINE AMBER (A) 01/24/2023 1020   APPEARANCEUR CLEAR 01/24/2023 1020   APPEARANCEUR Hazy (A) 03/27/2020 1413   LABSPEC 1.017 01/24/2023 1020   LABSPEC 1.015 02/03/2012 1915   PHURINE 6.0 01/24/2023 1020   GLUCOSEU NEGATIVE 01/24/2023 1020   GLUCOSEU NEGATIVE 12/23/2022 1055   HGBUR NEGATIVE 01/24/2023 1020   BILIRUBINUR MODERATE (A)  01/24/2023 1020   BILIRUBINUR Negative 03/27/2020 1413   BILIRUBINUR Negative 02/03/2012 1915   KETONESUR NEGATIVE 01/24/2023 1020   PROTEINUR NEGATIVE 01/24/2023 1020   UROBILINOGEN 2.0 (A) 12/23/2022 1055   NITRITE NEGATIVE 01/24/2023 1020   LEUKOCYTESUR NEGATIVE 01/24/2023 1020   LEUKOCYTESUR Negative 02/03/2012 1915    Radiological Exams on Admission: IR EXCHANGE BILIARY DRAIN  Result Date: 01/24/2023 INDICATION: History of acute cholecystitis and 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, post ultrasound fluoroscopic guided cholecystostomy tube placement on 01/02/2023. Patient now with worsening hyperbilirubinemia and jaundice and as such presents for cholangiogram and potential cholecystostomy tube exchange. EXAM: FLUOROSCOPIC GUIDED CHOLECYSTOSTOMY TUBE EXCHANGE COMPARISON:  MRCP-12/29/2022 Image guided cholecystostomy tube placement-01/02/2023 MEDICATIONS: None ANESTHESIA/SEDATION: None CONTRAST:  15mL OMNIPAQUE IOHEXOL 300 MG/ML SOLN - administered into the gallbladder lumen. FLUOROSCOPY TIME:  1 minute, 6 seconds (6.1 mGy) COMPLICATIONS: None immediate. PROCEDURE: The patient was positioned supine on the fluoroscopy table. The external portion of the existing cholecystostomy tube as well as the surrounding skin was prepped and draped in usual sterile fashion. A time-out was performed prior to the initiation of the procedure. A preprocedural spot fluoroscopic image was obtained of the right upper abdominal quadrant existing cholecystostomy tube. The skin surrounding the cholecystostomy tube was anesthetized with 1% lidocaine with epinephrine. The external portion of the cholecystostomy tube was cut and cannulated with a short Amplatz wire which was advanced through the tube and coiled within the gallbladder lumen Next, under intermittent fluoroscopic guidance, the existing 10 [place] cholecystostomy tube was exchanged for a new, slightly larger now 10 [place] cholecystostomy tube which was repositioned into  the more central aspect of the gallbladder lumen. Contrast injection confirms appropriate positioning and functionality of the cholecystomy tube. The cholecystostomy tube was flushed with a small amount of saline and reconnected to a gravity bag. The cholecystostomy tube was secured with an interrupted suture and a Stat Lock device. A dressing was applied. The patient tolerated the procedure well without  immediate postprocedural complication. FINDINGS: Preprocedural spot fluoroscopic image demonstrates unchanged positioning of cholecystostomy tube with end coiled and locked over the expected location of the fundus of the gallbladder. Contrast injection demonstrates appropriate positioning and functionality of the existing cholecystostomy tube. There is opacification of the peripheral aspect of the cystic duct without passage of contrast to the level of the CBD. After fluoroscopic guided exchange, the new, 10 [place] cholecystostomy tube is appropriately positioned within the gallbladder lumen. Post exchange cholangiogram demonstrates appropriate positioning and functionality of the new cholecystostomy tube. IMPRESSION: Successful fluoroscopic guided exchange of 10 [place] cholecystostomy tube. PLAN: - Cholangiogram demonstrates persistent occlusion of the cystic duct without opacification of the CBD. 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 The patient is visibly jaundiced with worsening hyperbilirubinemia (bilirubin - 3.9 on 11/11, 0.6 on the day of cholecystostomy tube placement) and as such the decision was made to escort the patient to the emergency department for further evaluation and management as the patient will likely require an ERCP to relieve his worsening biliary obstruction. Electronically Signed   By: Simonne Come M.D.   On: 01/24/2023 11:06    EKG: Independently reviewed.  Sinus rhythm 90 bpm..  PVC.  First-degree  AV block.  Early/increased left bundle branch block with QRS 126.  Similar to previous, but QRS duration longer.  Assessment/Plan Principal Problem:   Obstructive jaundice Active Problems:   Atrial fibrillation, chronic (HCC)   Hypertension   Seizure (HCC)   Obstructive jaundice Mirizzi syndrome ?Developing pancreatitis > Patient following up with IR today after recent admission from 10/28 until 11/1 with cholecystitis and 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 causing obstructive jaundice. > Has had worsening LFTs outpatient and was presenting for cholangiogram and possible cholecystostomy tube exchange.  Procedure noted persistent occlusion of the cystic duct without opacification consistent with Mirizzi syndrome not responding to cholecystostomy. > LFTs: AST 144, ALT 136, ALP 136, T. bili 20.9.  Lipase 1228. > Significant jaundice with patient reporting lethargy and decreased p.o. intake only. > General Surgery consulted and MRCP ordered in ED. - Monitor telemetry overnight - Appreciate general surgery recommendations and assistance, may need GI consultation pending MRCP results. - Follow-up MRCP - Supportive care - IV fluids - N.p.o. pending MRCP result, advance to clear liquid as tolerated  Hypertension - Continue home amlodipine and metoprolol  Atrial fibrillation - Continue home metoprolol - Holding Eliquis for possible surgical intervention  Seizure disorder - Continue home Depakote  History of MVR status post annuloplasty - Noted  DVT prophylaxis: SCDs  Code Status:   Full Family Communication:  None on admission  Disposition Plan:   Patient is from:  Home  Anticipated DC to:  Home  Anticipated DC date:  1 to 3 days  Anticipated DC barriers: None  Consults called:  General surgery, pending MRCP may need GI consult Admission status:  Observation, telemetry  Severity of Illness: The appropriate patient status for this patient is OBSERVATION. Observation status is judged  to be reasonable and necessary in order to provide the required intensity of service to ensure the patient's safety. The patient's presenting symptoms, physical exam findings, and initial radiographic and laboratory data in the context of their medical condition is felt to place them at decreased risk for further clinical deterioration. Furthermore, it is anticipated that the patient will be medically stable for discharge from the hospital within 2 midnights of admission.    Synetta Fail MD [facility]  How to contact the [facility] Attending or Consulting provider 7A - 7P or covering provider during after hours 7P -7A, for this patient?   Check the care team in [facility] and look for a) attending/consulting [facility] provider listed and b) the [facility] team listed Log into [email or web address] and use [facility]'s universal password to access. If you do not have the password, please contact the hospital operator. Locate the [facility] provider you are looking for under [facility] and page to a number that you can be directly reached. If you still have difficulty reaching the provider, please page the [facility] (Director on Call) for the Hospitalists listed on amion for assistance.  01/24/2023, 1:47 PM

## 2023-01-24 NOTE — ED Notes (Signed)
ED TO INPATIENT HANDOFF REPORT  ED Nurse Name and Phone #: Tito Dine Name/Age/Gender Samuel Mayo 77 y.o. male Room/Bed: 035C/035C  Code Status   Code Status: Full Code  Home/SNF/Other Home Patient oriented to: self, place, time, and situation Is this baseline? Yes   Triage Complete: Triage complete  Chief Complaint Obstructive jaundice [K83.1]  Triage Note Pt to ED via pov from radiology (Dr. Grace Isaac) where he was having an exam of gallbladder for jaundice. Pt was told to come to ED because his gallbladder looked fine and they are concerned about his liver. Pt states he has had more fatigue than usual for past couple of months. Pt has had worsening jaundice for past 3 months. Pt denies pain.    Allergies Allergies  Allergen Reactions   Other Other (See Comments)    Childhood reaction to Neosporin Ophthalmic ointment caused severe eye redness, irritation    Level of Care/Admitting Diagnosis ED Disposition     ED Disposition  Admit   Condition  --   Comment  Hospital Area: [facility] [[id number]]  Level of Care: Telemetry Medical [104]  May place patient in observation at [facility] or [facility] if equivalent level of care is available:: No  Covid Evaluation: Asymptomatic - no recent exposure (last 10 days) testing not required  Diagnosis: Obstructive jaundice [709956]  Admitting Physician: Synetta Fail [[id number]]  Attending Physician: Synetta Fail [[id number]]          B Medical/Surgery History Past Medical History:  Diagnosis Date   AK (actinic keratosis)    Allergy    Chicken pox    Compression fracture of L1 lumbar vertebra (HCC)    Compression fracture of thoracic vertebra (HCC)    Hypertension    Lentigines    Follows with DR Culton (derma)   Seizures (HCC)    Past Surgical History:  Procedure Laterality Date   CARDIOVERSION N/A 05/14/2021   Procedure: CARDIOVERSION;  Surgeon: Little Ishikawa, MD;  Location: [facility] ENDOSCOPY;  Service: Cardiovascular;  Laterality: N/A;   CARDIOVERSION N/A 10/08/2021   Procedure: CARDIOVERSION;  Surgeon: Christell Constant, MD;  Location: [facility] ENDOSCOPY;  Service: Cardiovascular;  Laterality: N/A;   COLONOSCOPY     IR EXCHANGE BILIARY DRAIN  01/24/2023   IR PERC CHOLECYSTOSTOMY  01/02/2023   mitral vavle     Congetinal mitral valve disease.     A IV Location/Drains/Wounds Patient Lines/Drains/Airways Status     Active Line/Drains/Airways     Name Placement date Placement time Site Days   Peripheral IV 01/24/23 20 G Anterior;Distal;Right;Upper Arm 01/24/23  1015  Arm  less than 1   Biliary Tube Cook slip-coat 10 Fr. RUQ 01/24/23  0915  RUQ  less than 1            Intake/Output Last 24 hours No intake or output data in the 24 hours ending 01/24/23 1347  Labs/Imaging Results for orders placed or performed during the hospital encounter of 01/24/23 (from the past 48 hour(s))  Comprehensive metabolic panel     Status: Abnormal   Collection Time: 01/24/23 10:20 AM  Result Value Ref Range   Sodium 127 (L) 135 - 145 mmol/L   Potassium 3.8 3.5 - 5.1 mmol/L   Chloride 93 (L) 98 - 111 mmol/L   CO2 23 22 - 32 mmol/L   Glucose, Bld 133 (H) 70 - 99 mg/dL    Comment: Glucose reference range applies only to samples taken  after fasting for at least 8 hours.   BUN 18 8 - 23 mg/dL   Creatinine, Ser 0.98 0.61 - 1.24 mg/dL   Calcium 8.6 (L) 8.9 - 10.3 mg/dL   Total Protein 6.0 (L) 6.5 - 8.1 g/dL   Albumin 2.5 (L) 3.5 - 5.0 g/dL   AST 119 (H) 15 - 41 U/L   ALT 136 (H) 0 - 44 U/L   Alkaline Phosphatase 436 (H) 38 - 126 U/L   Total Bilirubin 20.9 (HH) <1.2 mg/dL    Comment:  Notified K. Harjot Dibello RN , @1327  , 01/24/23, [place], T.    GFR, Estimated >60 >60 mL/min    Comment: (NOTE) Calculated using the CKD-EPI Creatinine Equation (2021)    Anion gap 11 5 - 15    Comment: Performed at [facility], [address] [place], [place] [address]   Lipase, blood     Status: Abnormal   Collection Time: 01/24/23 10:20 AM  Result Value Ref Range   Lipase 1,228 (H) 11 - 51 U/L    Comment: RESULTS CONFIRMED BY MANUAL DILUTION Performed at [facility], [address] [place], [place] [address]   Urinalysis, Routine w reflex microscopic -Urine, Clean Catch     Status: Abnormal   Collection Time: 01/24/23 10:20 AM  Result Value Ref Range   Color, Urine AMBER (A) YELLOW    Comment: BIOCHEMICALS MAY BE AFFECTED BY COLOR   APPearance CLEAR CLEAR   Specific Gravity, Urine 1.017 1.005 - 1.030   pH 6.0 5.0 - 8.0   Glucose, UA NEGATIVE NEGATIVE mg/dL   Hgb urine dipstick NEGATIVE NEGATIVE   Bilirubin Urine MODERATE (A) NEGATIVE   Ketones, ur NEGATIVE NEGATIVE mg/dL   Protein, ur NEGATIVE NEGATIVE mg/dL   Nitrite NEGATIVE NEGATIVE   Leukocytes,Ua NEGATIVE NEGATIVE    Comment: Performed at [facility], [address] [place], [place] [address]  CBC with Differential/Platelet     Status: Abnormal   Collection Time: 01/24/23 10:20 AM  Result Value Ref Range   WBC 5.0 4.0 - 10.5 K/uL   RBC 3.10 (L) 4.22 - 5.81 MIL/uL   Hemoglobin 10.3 (L) 13.0 - 17.0 g/dL   HCT 78.4 (L) 69.6 - 29.5 %   MCV 96.1 80.0 - 100.0 fL   MCH 33.2 26.0 - 34.0 pg   MCHC 34.6 30.0 - 36.0 g/dL   RDW 28.4 (H) 13.2 - 44.0 %   Platelets 189 150 - 400 K/uL   nRBC 0.0 0.0 - 0.2 %   Neutrophils Relative % 61 %   Neutro Abs 3.1 1.7 - 7.7 K/uL   Lymphocytes Relative 20 %   Lymphs Abs 1.0 0.7 - 4.0 K/uL   Monocytes Relative 15 %   Monocytes Absolute 0.7 0.1 - 1.0 K/uL   Eosinophils Relative 2 %   Eosinophils Absolute 0.1 0.0 - 0.5 K/uL   Basophils Relative 1 %   Basophils Absolute 0.1 0.0 - 0.1 K/uL   Immature Granulocytes 1 %   Abs Immature Granulocytes 0.03 0.00 - 0.07 K/uL    Comment: Performed at [facility], [address] [place], [place] [address]  Valproic acid level     Status: None   Collection Time: 01/24/23 10:26 AM  Result Value  Ref Range   Valproic Acid Lvl 55 50.0 - 100.0 ug/mL    Comment: Performed at [facility], [address] [place], [place] [address]   IR EXCHANGE BILIARY  DRAIN  Result Date: 01/24/2023 INDICATION: History of acute cholecystitis and 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, post ultrasound fluoroscopic guided cholecystostomy tube placement on 01/02/2023. Patient now with worsening hyperbilirubinemia and jaundice and as such presents for cholangiogram and potential cholecystostomy tube exchange. EXAM: FLUOROSCOPIC GUIDED CHOLECYSTOSTOMY TUBE EXCHANGE COMPARISON:  MRCP-12/29/2022 Image guided cholecystostomy tube placement-01/02/2023 MEDICATIONS: None ANESTHESIA/SEDATION: None CONTRAST:  15mL OMNIPAQUE IOHEXOL 300 MG/ML SOLN - administered into the gallbladder lumen. FLUOROSCOPY TIME:  1 minute, 6 seconds (6.1 mGy) COMPLICATIONS: None immediate. PROCEDURE: The patient was positioned supine on the fluoroscopy table. The external portion of the existing cholecystostomy tube as well as the surrounding skin was prepped and draped in usual sterile fashion. A time-out was performed prior to the initiation of the procedure. A preprocedural spot fluoroscopic image was obtained of the right upper abdominal quadrant existing cholecystostomy tube. The skin surrounding the cholecystostomy tube was anesthetized with 1% lidocaine with epinephrine. The external portion of the cholecystostomy tube was cut and cannulated with a short Amplatz wire which was advanced through the tube and coiled within the gallbladder lumen Next, under intermittent fluoroscopic guidance, the existing 10 [place] cholecystostomy tube was exchanged for a new, slightly larger now 10 [place] cholecystostomy tube which was repositioned into the more central aspect of the gallbladder lumen. Contrast injection confirms appropriate positioning and functionality of the cholecystomy tube. The cholecystostomy tube was flushed with a small amount of saline and  reconnected to a gravity bag. The cholecystostomy tube was secured with an interrupted suture and a Stat Lock device. A dressing was applied. The patient tolerated the procedure well without immediate postprocedural complication. FINDINGS: Preprocedural spot fluoroscopic image demonstrates unchanged positioning of cholecystostomy tube with end coiled and locked over the expected location of the fundus of the gallbladder. Contrast injection demonstrates appropriate positioning and functionality of the existing cholecystostomy tube. There is opacification of the peripheral aspect of the cystic duct without passage of contrast to the level of the CBD. After fluoroscopic guided exchange, the new, 10 [place] cholecystostomy tube is appropriately positioned within the gallbladder lumen. Post exchange cholangiogram demonstrates appropriate positioning and functionality of the new cholecystostomy tube. IMPRESSION: Successful fluoroscopic guided exchange of 10 French cholecystostomy tube. PLAN: - Cholangiogram demonstrates persistent occlusion of the cystic duct without opacification of the CBD. 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 The patient is visibly jaundiced with worsening hyperbilirubinemia (bilirubin - 3.9 on 11/11, 0.6 on the day of cholecystostomy tube placement) and as such the decision was made to escort the patient to the emergency department for further evaluation and management as the patient will likely require an ERCP to relieve his worsening biliary obstruction. Electronically Signed   By: Simonne Come M.D.   On: 01/24/2023 11:06    Pending Labs Unresulted Labs (From admission, onward)     Start     Ordered   01/25/23 0500  Comprehensive metabolic panel  Tomorrow morning,   R        01/24/23 1341   01/25/23 0500  CBC  Tomorrow morning,   R        01/24/23 1341   01/25/23 0500  Protime-INR  Tomorrow morning,    R        01/24/23 1341   01/24/23 1011  CBC with Differential  (ED Abdominal Pain)  Once,   STAT        01/24/23 1010  Vitals/Pain Today's Vitals   01/24/23 0953 01/24/23 0958 01/24/23 1130 01/24/23 1230  BP:  118/83 103/74 114/80  Pulse:  92 74 71  Resp:  16 16 (!) 21  Temp:  98.1 F (36.7 C)    TempSrc:  Oral    SpO2:  98% 98% 99%  Weight: 190 lb (86.2 kg)     Height: 5\' 11"  (1.803 m)     PainSc: 0-No pain       Isolation Precautions No active isolations  Medications Medications  amLODipine (NORVASC) tablet 5 mg (has no administration in time range)  metoprolol succinate (TOPROL-XL) 24 hr tablet 25 mg (has no administration in time range)  divalproex (DEPAKOTE) DR tablet 750 mg (has no administration in time range)  gabapentin (NEURONTIN) capsule 300 mg (has no administration in time range)  sodium chloride flush (NS) 0.9 % injection 3 mL (has no administration in time range)  acetaminophen (TYLENOL) tablet 650 mg (has no administration in time range)    Or  acetaminophen (TYLENOL) suppository 650 mg (has no administration in time range)  polyethylene glycol (MIRALAX / GLYCOLAX) packet 17 g (has no administration in time range)  sodium chloride 0.9 % bolus 500 mL (0 mLs Intravenous Stopped 01/24/23 1131)    Mobility walks     Focused Assessments    R Recommendations: See Admitting Provider Note  Report given to:   Additional Notes:

## 2023-01-24 NOTE — ED Notes (Addendum)
Patient transported to MRI per Dr. Particia Nearing tele monitoring no longer required

## 2023-01-25 DIAGNOSIS — K819 Cholecystitis, unspecified: Secondary | ICD-10-CM | POA: Diagnosis not present

## 2023-01-25 DIAGNOSIS — K831 Obstruction of bile duct: Secondary | ICD-10-CM | POA: Diagnosis not present

## 2023-01-25 DIAGNOSIS — I482 Chronic atrial fibrillation, unspecified: Secondary | ICD-10-CM | POA: Diagnosis not present

## 2023-01-25 DIAGNOSIS — R17 Unspecified jaundice: Secondary | ICD-10-CM | POA: Diagnosis not present

## 2023-01-25 DIAGNOSIS — R7401 Elevation of levels of liver transaminase levels: Secondary | ICD-10-CM | POA: Diagnosis not present

## 2023-01-25 LAB — CBC
HCT: 25.5 % — ABNORMAL LOW (ref 39.0–52.0)
Hemoglobin: 8.9 g/dL — ABNORMAL LOW (ref 13.0–17.0)
MCH: 33.3 pg (ref 26.0–34.0)
MCHC: 34.9 g/dL (ref 30.0–36.0)
MCV: 95.5 fL (ref 80.0–100.0)
Platelets: 180 10*3/uL (ref 150–400)
RBC: 2.67 MIL/uL — ABNORMAL LOW (ref 4.22–5.81)
RDW: 20.6 % — ABNORMAL HIGH (ref 11.5–15.5)
WBC: 4.6 10*3/uL (ref 4.0–10.5)
nRBC: 0 % (ref 0.0–0.2)

## 2023-01-25 LAB — COMPREHENSIVE METABOLIC PANEL
ALT: 117 U/L — ABNORMAL HIGH (ref 0–44)
AST: 134 U/L — ABNORMAL HIGH (ref 15–41)
Albumin: 2 g/dL — ABNORMAL LOW (ref 3.5–5.0)
Alkaline Phosphatase: 428 U/L — ABNORMAL HIGH (ref 38–126)
Anion gap: 9 (ref 5–15)
BUN: 11 mg/dL (ref 8–23)
CO2: 22 mmol/L (ref 22–32)
Calcium: 7.9 mg/dL — ABNORMAL LOW (ref 8.9–10.3)
Chloride: 96 mmol/L — ABNORMAL LOW (ref 98–111)
Creatinine, Ser: 0.8 mg/dL (ref 0.61–1.24)
GFR, Estimated: >60 mL/min (ref >60)
Glucose, Bld: 83 mg/dL (ref 70–99)
Potassium: 4 mmol/L (ref 3.5–5.1)
Sodium: 127 mmol/L — ABNORMAL LOW (ref 135–145)
Total Bilirubin: 19.4 mg/dL (ref <1.2)
Total Protein: 4.9 g/dL — ABNORMAL LOW (ref 6.5–8.1)

## 2023-01-25 LAB — PROTIME-INR
INR: 1.2 (ref 0.8–1.2)
Prothrombin Time: 15 s (ref 11.4–15.2)

## 2023-01-25 MED ORDER — SODIUM CHLORIDE 0.9 % IV SOLN
1.5000 g | Freq: Four times a day (QID) | INTRAVENOUS | Status: DC
  Administered 2023-01-25 – 2023-02-01 (×28): 1.5 g via INTRAVENOUS
  Filled 2023-01-25 (×31): qty 4

## 2023-01-25 NOTE — Progress Notes (Signed)
Assessment & Plan: Jaundice  - admit 10/28-11/1 with acute cholecystitis and adjacent abscess, possible Mirizzi syndrome  - IR drain placement 10/31  - drain study/exchange 11/22 by IR with persistent occlusion of cystic duct and no opacification of CBD - Tbili 19.4 this AM - MRCP reviewed - ductal dilatation, "thickening" of extrahepatic ductal wall - ? Cholangitis - recommend GI consultation at this time - will follow - allow clear liquid diet this AM  hx of MVR w/ annuloplasty Afib on Eliquis HTN Seizure disorder    FEN: Clear liquid diet ID: none currently VTE: SCDs   I reviewed last 24 h vitals and pain scores, last 48 h intake and output, last 24 h labs and trends, and last 24 h imaging results.        Samuel Level, MD [facility] practice Office: [phone]        Chief Complaint: Jaundice  Subjective: Patient in bed, comfortable.  Wants coffee.  Objective: Vital signs in last 24 hours: Temp:  [97.7 F (36.5 C)-98.1 F (36.7 C)] 97.9 F (36.6 C) (11/23 0810) Pulse Rate:  [71-92] 81 (11/23 0810) Resp:  [14-21] 17 (11/23 0810) BP: (103-143)/(73-84) 120/84 (11/23 0810) SpO2:  [96 %-100 %] 97 % (11/23 0810) Weight:  [86.2 kg] 86.2 kg (11/22 0953) Last BM Date : 01/24/23  Intake/Output from previous day: 11/22 0701 - 11/23 0700 In: 560.4 [I.V.:560.4] Out: 0  Intake/Output this shift: No intake/output data recorded.  Physical Exam: HEENT - jaundiced Abd - soft without distension; non-tender; perc drain with thin bilious  Lab Results:  Recent Labs    01/24/23 1020 01/25/23 0548  WBC 5.0 4.6  HGB 10.3* 8.9*  HCT 29.8* 25.5*  PLT 189 180   BMET Recent Labs    01/24/23 1020 01/25/23 0548  NA 127* 127*  K 3.8 4.0  CL 93* 96*  CO2 23 22  GLUCOSE 133* 83  BUN 18 11  CREATININE 0.81 0.80  CALCIUM 8.6* 7.9*   PT/INR Recent Labs    01/25/23 0548  LABPROT 15.0  INR 1.2   Comprehensive Metabolic Panel:     Component Value Date/Time   NA 127 (L) 01/25/2023 0548   NA 127 (L) 01/24/2023 1020   NA 141 05/20/2022 1026   NA 137 10/05/2021 1120   NA 136 02/03/2012 1915   K 4.0 01/25/2023 0548   K 3.8 01/24/2023 1020   K 3.8 02/03/2012 1915   CL 96 (L) 01/25/2023 0548   CL 93 (L) 01/24/2023 1020   CL 102 02/03/2012 1915   CO2 22 01/25/2023 0548   CO2 23 01/24/2023 1020   CO2 27 02/03/2012 1915   BUN 11 01/25/2023 0548   BUN 18 01/24/2023 1020   BUN 24 05/20/2022 1026   BUN 23 10/05/2021 1120   BUN 18 02/03/2012 1915   CREATININE 0.80 01/25/2023 0548   CREATININE 0.81 01/24/2023 1020   CREATININE 0.90 02/03/2012 1915   GLUCOSE 83 01/25/2023 0548   GLUCOSE 133 (H) 01/24/2023 1020   GLUCOSE 87 02/03/2012 1915   CALCIUM 7.9 (L) 01/25/2023 0548   CALCIUM 8.6 (L) 01/24/2023 1020   CALCIUM 8.2 (L) 02/03/2012 1915   AST 134 (H) 01/25/2023 0548   AST 144 (H) 01/24/2023 1020   AST 31 02/03/2012 1915   ALT 117 (H) 01/25/2023 0548   ALT 136 (H) 01/24/2023 1020   ALT 31 02/03/2012 1915   ALKPHOS 428 (H) 01/25/2023 0548   ALKPHOS 436 (  H) 01/24/2023 1020   ALKPHOS 75 02/03/2012 1915   BILITOT 19.4 (HH) 01/25/2023 0548   BILITOT 20.9 (HH) 01/24/2023 1020   BILITOT 0.4 05/20/2022 1026   BILITOT 0.5 07/19/2021 1128   BILITOT 0.5 02/03/2012 1915   PROT 4.9 (L) 01/25/2023 0548   PROT 6.0 (L) 01/24/2023 1020   PROT 5.9 (L) 05/20/2022 1026   PROT 6.4 07/19/2021 1128   PROT 6.7 02/03/2012 1915   ALBUMIN 2.0 (L) 01/25/2023 0548   ALBUMIN 2.5 (L) 01/24/2023 1020   ALBUMIN 3.9 05/20/2022 1026   ALBUMIN 4.6 07/19/2021 1128   ALBUMIN 3.9 02/03/2012 1915    Studies/Results: MR ABDOMEN MRCP W WO CONTAST  Result Date: 01/24/2023 CLINICAL DATA:  Jaundice. History of acute cholecystitis status post percutaneous cholecystostomy tube placement 01/02/2023 EXAM: MRI ABDOMEN WITHOUT AND WITH CONTRAST (INCLUDING MRCP) TECHNIQUE: Multiplanar multisequence MR imaging of the abdomen was performed both  before and after the administration of intravenous contrast. Heavily T2-weighted images of the biliary and pancreatic ducts were obtained. Post-processing was applied at the acquisition scanner with concurrent physician supervision which includes 3D reconstructions, MIPs, volume rendered images and/or shaded surface rendering. CONTRAST:  8.71mL GADAVIST GADOBUTROL 1 MMOL/ML IV SOLN COMPARISON:  MR abdomen dated 12/29/2022 FINDINGS: Decreased sensitivity and specificity for detailed findings due to motion artifact. Lower chest: Trace bilateral pleural effusions. Hepatobiliary: Parenchymal signal abnormality. Scattered subcentimeter T2 hyperintense foci, likely cysts. Increased mild to moderate intrahepatic bile duct dilation more prominent in the left hepatic lobe with new mural thickening of the extrahepatic bile duct (4:16) and enhancement (1304:45). The common bile duct is nondilated. Gallbladder is decompressed with catheter in-situ and demonstrates circumferential mural thickening. Pancreas: No mass or inflammatory changes. 3 mm T2 hyperintense cystic focus arises directly from the main pancreatic duct in the pancreatic neck (6:20) keeping with side branch intraductal papillary mucinous neoplasms (IPMN). No main ductal dilation, mass lesion, or abnormal enhancement. Spleen:  Within normal limits in size and appearance. Adrenals/Urinary Tract: No adrenal nodules. No suspicious renal masses identified. No evidence of hydronephrosis. Stomach/Bowel: Small proximal duodenal diverticulum. Colonic diverticulosis without acute diverticulitis. Vascular/Lymphatic: No pathologically enlarged lymph nodes identified. No abdominal aortic aneurysm demonstrated. Other:  Trace ascites. Musculoskeletal: No suspicious bone lesions identified. Susceptibility artifact related to median sternotomy wires. IMPRESSION: 1. Increased mild to moderate intrahepatic bile duct dilation, more prominent in the left hepatic lobe, with new mural  thickening and enhancement of the extrahepatic bile duct, suspicious for cholangitis. 2. Decompressed gallbladder with catheter in-situ and circumferential mural thickening. 3. Trace bilateral pleural effusions and trace ascites. Electronically Signed   By: Agustin Cree M.D.   On: 01/24/2023 15:54   MR 3D Recon At Scanner  Result Date: 01/24/2023 CLINICAL DATA:  Jaundice. History of acute cholecystitis status post percutaneous cholecystostomy tube placement 01/02/2023 EXAM: MRI ABDOMEN WITHOUT AND WITH CONTRAST (INCLUDING MRCP) TECHNIQUE: Multiplanar multisequence MR imaging of the abdomen was performed both before and after the administration of intravenous contrast. Heavily T2-weighted images of the biliary and pancreatic ducts were obtained. Post-processing was applied at the acquisition scanner with concurrent physician supervision which includes 3D reconstructions, MIPs, volume rendered images and/or shaded surface rendering. CONTRAST:  8.76mL GADAVIST GADOBUTROL 1 MMOL/ML IV SOLN COMPARISON:  MR abdomen dated 12/29/2022 FINDINGS: Decreased sensitivity and specificity for detailed findings due to motion artifact. Lower chest: Trace bilateral pleural effusions. Hepatobiliary: Parenchymal signal abnormality. Scattered subcentimeter T2 hyperintense foci, likely cysts. Increased mild to moderate intrahepatic bile duct dilation more prominent in the left  hepatic lobe with new mural thickening of the extrahepatic bile duct (4:16) and enhancement (1304:45). The common bile duct is nondilated. Gallbladder is decompressed with catheter in-situ and demonstrates circumferential mural thickening. Pancreas: No mass or inflammatory changes. 3 mm T2 hyperintense cystic focus arises directly from the main pancreatic duct in the pancreatic neck (6:20) keeping with side branch intraductal papillary mucinous neoplasms (IPMN). No main ductal dilation, mass lesion, or abnormal enhancement. Spleen:  Within normal limits in size and  appearance. Adrenals/Urinary Tract: No adrenal nodules. No suspicious renal masses identified. No evidence of hydronephrosis. Stomach/Bowel: Small proximal duodenal diverticulum. Colonic diverticulosis without acute diverticulitis. Vascular/Lymphatic: No pathologically enlarged lymph nodes identified. No abdominal aortic aneurysm demonstrated. Other:  Trace ascites. Musculoskeletal: No suspicious bone lesions identified. Susceptibility artifact related to median sternotomy wires. IMPRESSION: 1. Increased mild to moderate intrahepatic bile duct dilation, more prominent in the left hepatic lobe, with new mural thickening and enhancement of the extrahepatic bile duct, suspicious for cholangitis. 2. Decompressed gallbladder with catheter in-situ and circumferential mural thickening. 3. Trace bilateral pleural effusions and trace ascites. Electronically Signed   By: Agustin Cree M.D.   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 Patient now with worsening hyperbilirubinemia and jaundice and as such presents for cholangiogram and potential cholecystostomy tube exchange. EXAM: FLUOROSCOPIC GUIDED CHOLECYSTOSTOMY TUBE EXCHANGE COMPARISON:  MRCP-12/29/2022 Image guided cholecystostomy tube placement-01/02/2023 MEDICATIONS: None ANESTHESIA/SEDATION: None CONTRAST:  15mL OMNIPAQUE IOHEXOL 300 MG/ML SOLN - administered into the gallbladder lumen. FLUOROSCOPY TIME:  1 minute, 6 seconds (6.1 mGy) COMPLICATIONS: None immediate. PROCEDURE: The patient was positioned supine on the fluoroscopy table. The external portion of the existing cholecystostomy tube as well as the surrounding skin was prepped and draped in usual sterile fashion. A time-out was performed prior to the initiation of the procedure. A preprocedural spot fluoroscopic image was  obtained of the right upper abdominal quadrant existing cholecystostomy tube. The skin surrounding the cholecystostomy tube was anesthetized with 1% lidocaine with epinephrine. The external portion of the cholecystostomy tube was cut and cannulated with a short Amplatz wire which was advanced through the tube and coiled within the gallbladder lumen Next, under intermittent fluoroscopic guidance, the existing 10 [place] cholecystostomy tube was exchanged for a new, slightly larger now 10 [place] cholecystostomy tube which was repositioned into the more central aspect of the gallbladder lumen. Contrast injection confirms appropriate positioning and functionality of the cholecystomy tube. The cholecystostomy tube was flushed with a small amount of saline and reconnected to a gravity bag. The cholecystostomy tube was secured with an interrupted suture and a Stat Lock device. A dressing was applied. The patient tolerated the procedure well without immediate postprocedural complication. FINDINGS: Preprocedural spot fluoroscopic image demonstrates unchanged positioning of cholecystostomy tube with end coiled and locked over the expected location of the fundus of the gallbladder. Contrast injection demonstrates appropriate positioning and functionality of the existing cholecystostomy tube. There is opacification of the peripheral aspect of the cystic duct without passage of contrast to the Mayo of the CBD. After fluoroscopic guided exchange, the new, 10 [place] cholecystostomy tube is appropriately positioned within the gallbladder lumen. Post exchange cholangiogram demonstrates appropriate positioning and functionality of the new cholecystostomy tube. IMPRESSION: Successful fluoroscopic guided exchange of 10 French cholecystostomy tube. PLAN: - Cholangiogram demonstrates persistent occlusion of the cystic duct without opacification of the CBD. - Unfortunately, the patient's  cholecystostomy tube has not resulted in  improvement in the patient's 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. The patient is visibly jaundiced with worsening hyperbilirubinemia (bilirubin - 3.9 on 11/11, 0.6 on the day of cholecystostomy tube placement) and as such the decision was made to escort the patient to the emergency department for further evaluation and management as the patient will likely require an ERCP to relieve his worsening biliary obstruction. Electronically Signed   By: Simonne Come M.D.   On: 01/24/2023 11:06      Samuel Mayo 01/25/2023  Patient ID: Samuel Mayo, male   DOB: 09-Oct-1945, 77 y.o.   MRN: [id number]

## 2023-01-25 NOTE — Progress Notes (Signed)
Pharmacy Antibiotic Note  Samuel Mayo is a 77 y.o. male admitted on 01/24/2023 with acute cholecystitis .  Pharmacy has been consulted for Unasyn dosing.  -he is currently on Unasyn 1.5gm IV q6h -WBC= 4.6, afebrile -CrCl ~ 80  Plan: -Continue Unasyn 1,5gm IV q6h -Will follow renal function, cultures and clinical progress   Height: 5\' 11"  (180.3 cm) Weight: 86.2 kg (190 lb) IBW/kg (Calculated) : 75.3  Temp (24hrs), Avg:97.9 F (36.6 C), Min:97.7 F (36.5 C), Max:98 F (36.7 C)  Recent Labs  Lab 01/24/23 1020 01/25/23 0548  WBC 5.0 4.6  CREATININE 0.81 0.80    Estimated Creatinine Clearance: 81.1 mL/min (by C-G formula based on SCr of 0.8 mg/dL).    Allergies  Allergen Reactions   Other Other (See Comments)    Childhood reaction to Neosporin Ophthalmic ointment caused severe eye redness, irritation    Antimicrobials this admission: 11/23 unasyn    Microbiology results: 10/31 bile -  raoutella planticola  Thank you for allowing pharmacy to be a part of this patient's care.  Harland German, PharmD Clinical Pharmacist **Pharmacist phone directory can now be found on [email or web address] ([email or web address]).  Listed under [facility].

## 2023-01-25 NOTE — Progress Notes (Addendum)
PROGRESS NOTE    Samuel Mayo  WUJ:[id number] DOB: [birth year] DOA: 01/24/2023 PCP: [name], [name], MD    Brief Narrative:   Samuel Mayo is a 77 y.o. male with past medical history significant for  hypertension, atrial fibrillation, seizures, gout, MVR status post annuloplasty presenting with worsening jaundice.   Patient was admitted from 10/28 until 11/1.  Had cholecystitis but underwent cholecystostomy tube in setting of cholecystectomy.  Diagnosed with likely Mirizzi syndrome as there is no obstructing stone noted.  Follow-up outpatient and was noted to have worsening LFTs.  Was reevaluated by IR and was seen day of admission for reevaluation of gallbladder and underwent cholecystostomy tube exchange. During imaging today patient noted to have persistent occlusion of the cystic duct with out opacification consistent with 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.  Given worsening laboratory abnormalities patient recommended to proceed to the ED for further evaluation.   Patient reports some lethargy and decreased appetite but denies fever, chills, chest pain, shortness of breath, abdominal pain, constipation, diarrhea, nausea, vomiting.   In the ED, temperature 98.1 F, HR 92, RR 16, BP 118/83, SpO2 98% on room air.  WBC 5.0, hemoglobin 10.3, platelet count 189.  Sodium 127, potassium 3.8, chloride 93, CO2 23, glucose 133, BUN 18, creatinine 0.81.  Lipase 1228.  AST 144, ALT 136, total bilirubin 20.9.  Urinalysis with moderate bilirubin, otherwise unrevealing.  INR 1.2.  Patient received IV fluid bolus in the ED.  General surgery consulted with recommendation of MRCP.  TRH consulted for admission for further evaluation management of obstructive jaundice.   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   Underwent exchange of cholecystostomy tube.  During procedure noted persistent occlusion of the cystic duct without opacification consistent with Mirizzi syndrome not responding appropriately to cholecystostomy.  MRCP with increased mild/moderate intrahepatic bile duct dilation, more prominent left hepatic lobe with new mural thickening, enhancement of the head intrahepatic bile duct suspicious for cholangitis, decompressed gallbladder with catheter in place and circumferential mural thickening. -- General Surgery following, appreciate assistance -- AST 144>134 -- ALT 136>117 -- Tbili 20.9>19.4 -- [place] gastro[facility]ology consulted for consideration of ERCP -- CMP daily -- Flush drain every 12 hours, monitor drain output every shift -- Unasyn -- Further per GI/general surgery; per GI, Dr. Barron Alvine; no plans for ERCP tomorrow, earliest likely on Monday after discussion with biliary surgeons  Hyponatremia Baseline sodium over the past 1 month 128-132.  Sodium 127 on admission, close to baseline. -- Monitor CMP daily  Essential hypertension -- Amlodipine 5 mg p.o. daily -- Metoprolol succinate 25 mg p.o. daily  Paroxysmal atrial fibrillation -- Metoprolol -- Holding Eliquis for need of likely ERCP  Seizure disorder Valproic acid level --Depakote 750 mg.  Twice daily  History of MVR s/p annuloplasty Continue outpatient follow-up with cardiology   DVT prophylaxis: SCDs Start: 01/24/23 1337    Code Status: Full Code Family Communication:   Disposition Plan:  Level of care: Telemetry Medical Status is: Observation The patient remains OBS appropriate and will d/c before 2 midnights.    Consultants:  General surgery  gastroenterology  Procedures:  Cholecystostomy exchange by IR on 11/22  Antimicrobials:  None   Subjective: Patient seen examined bedside, resting comfortably.  Lying in bed.  Spouse present.  Drinking coffee.  Discussed with general surgery, Dr.  Gerrit Friends this morning; recommending GI consultation  for consideration of ERCP.  Started on clear liquid diet.  Continue to hold Eliquis.  Patient endorses light-colored stools.  Patient denies headache, no dizziness, no chest pain, no shortness of breath, no abdominal pain, no fever/chills/night sweats, no nausea/vomiting/diarrhea, no focal weakness, no fatigue, no paresthesia.  No acute events overnight per nursing.  Objective: Vitals:   01/24/23 1535 01/24/23 2013 01/25/23 0344 01/25/23 0810  BP: (!) 143/81 124/80 112/73 120/84  Pulse: 81 73 78 81  Resp: 16 16 16 17   Temp: 98.1 F (36.7 C) 97.8 F (36.6 C) 97.7 F (36.5 C) 97.9 F (36.6 C)  TempSrc: Oral Oral Oral Oral  SpO2: 100% 99% 96% 97%  Weight:      Height:        Intake/Output Summary (Last 24 hours) at 01/25/2023 1209 Last data filed at 01/25/2023 0600 Gross per 24 hour  Intake 560.36 ml  Output 0 ml  Net 560.36 ml   Filed Weights   01/24/23 0953  Weight: 86.2 kg    Examination:  Physical Exam: GEN: NAD, alert and oriented x 3, wd/wn, + jaundice HEENT: NCAT, PERRL, EOMI, + scleral icterus, MMM PULM: CTAB w/o wheezes/crackles, normal respiratory effort, on room air CV: RRR w/o M/G/R GI: abd soft, NTND, NABS, no R/G/M, noted cholecystostomy drain right side with bilious fluid in collection bag MSK: no peripheral edema, muscle strength globally intact 5/5 bilateral upper/lower extremities NEURO: CN II-XII intact, no focal deficits, sensation to light touch intact PSYCH: normal mood/affect Integumentary: Diffuse jaundice skin, otherwise no other concerning rashes/lesions/wounds noted on exposed skin surfaces dry/intact, no rashes or wounds    Data Reviewed: I have personally reviewed following labs and imaging studies  CBC: Recent Labs  Lab 01/24/23 1020 01/25/23 0548  WBC 5.0 4.6  NEUTROABS 3.1  --   HGB 10.3* 8.9*  HCT 29.8* 25.5*  MCV 96.1 95.5  PLT 189 180   Basic Metabolic Panel: Recent Labs   Lab 01/24/23 1020 01/25/23 0548  NA 127* 127*  K 3.8 4.0  CL 93* 96*  CO2 23 22  GLUCOSE 133* 83  BUN 18 11  CREATININE 0.81 0.80  CALCIUM 8.6* 7.9*   GFR: Estimated Creatinine Clearance: 81.1 mL/min (by C-G formula based on SCr of 0.8 mg/dL). Liver Function Tests: Recent Labs  Lab 01/24/23 1020 01/25/23 0548  AST 144* 134*  ALT 136* 117*  ALKPHOS 436* 428*  BILITOT 20.9* 19.4*  PROT 6.0* 4.9*  ALBUMIN 2.5* 2.0*   Recent Labs  Lab 01/24/23 1020  LIPASE 1,228*   No results for input(s): "AMMONIA" in the last 168 hours. Coagulation Profile: Recent Labs  Lab 01/25/23 0548  INR 1.2   Cardiac Enzymes: No results for input(s): "CKTOTAL", "CKMB", "CKMBINDEX", "TROPONINI" in the last 168 hours. BNP (last 3 results) No results for input(s): "PROBNP" in the last 8760 hours. HbA1C: No results for input(s): "HGBA1C" in the last 72 hours. CBG: No results for input(s): "GLUCAP" in the last 168 hours. Lipid Profile: No results for input(s): "CHOL", "HDL", "LDLCALC", "TRIG", "CHOLHDL", "LDLDIRECT" in the last 72 hours. Thyroid Function Tests: No results for input(s): "TSH", "T4TOTAL", "FREET4", "T3FREE", "THYROIDAB" in the last 72 hours. Anemia Panel: No results for input(s): "VITAMINB12", "FOLATE", "FERRITIN", "TIBC", "IRON", "RETICCTPCT" in the last 72 hours. Sepsis Labs: No results for input(s): "PROCALCITON", "LATICACIDVEN" in the last 168 hours.  No results found for this or any previous visit (from the past 240 hour(s)).       Radiology Studies: MR  ABDOMEN MRCP W WO CONTAST  Result Date: 01/24/2023 CLINICAL DATA:  Jaundice. History of acute cholecystitis status post percutaneous cholecystostomy tube placement 01/02/2023 EXAM: MRI ABDOMEN WITHOUT AND WITH CONTRAST (INCLUDING MRCP) TECHNIQUE: Multiplanar multisequence MR imaging of the abdomen was performed both before and after the administration of intravenous contrast. Heavily T2-weighted images of the  biliary and pancreatic ducts were obtained. Post-processing was applied at the acquisition scanner with concurrent physician supervision which includes 3D reconstructions, MIPs, volume rendered images and/or shaded surface rendering. CONTRAST:  8.74mL GADAVIST GADOBUTROL 1 MMOL/ML IV SOLN COMPARISON:  MR abdomen dated 12/29/2022 FINDINGS: Decreased sensitivity and specificity for detailed findings due to motion artifact. Lower chest: Trace bilateral pleural effusions. Hepatobiliary: Parenchymal signal abnormality. Scattered subcentimeter T2 hyperintense foci, likely cysts. Increased mild to moderate intrahepatic bile duct dilation more prominent in the left hepatic lobe with new mural thickening of the extrahepatic bile duct (4:16) and enhancement (1304:45). The common bile duct is nondilated. Gallbladder is decompressed with catheter in-situ and demonstrates circumferential mural thickening. Pancreas: No mass or inflammatory changes. 3 mm T2 hyperintense cystic focus arises directly from the main pancreatic duct in the pancreatic neck (6:20) keeping with side branch intraductal papillary mucinous neoplasms (IPMN). No main ductal dilation, mass lesion, or abnormal enhancement. Spleen:  Within normal limits in size and appearance. Adrenals/Urinary Tract: No adrenal nodules. No suspicious renal masses identified. No evidence of hydronephrosis. Stomach/Bowel: Small proximal duodenal diverticulum. Colonic diverticulosis without acute diverticulitis. Vascular/Lymphatic: No pathologically enlarged lymph nodes identified. No abdominal aortic aneurysm demonstrated. Other:  Trace ascites. Musculoskeletal: No suspicious bone lesions identified. Susceptibility artifact related to median sternotomy wires. IMPRESSION: 1. Increased mild to moderate intrahepatic bile duct dilation, more prominent in the left hepatic lobe, with new mural thickening and enhancement of the extrahepatic bile duct, suspicious for cholangitis. 2.  Decompressed gallbladder with catheter in-situ and circumferential mural thickening. 3. Trace bilateral pleural effusions and trace ascites. Electronically Signed   By: Agustin Cree M.D.   On: 01/24/2023 15:54   MR 3D Recon At Scanner  Result Date: 01/24/2023 CLINICAL DATA:  Jaundice. History of acute cholecystitis status post percutaneous cholecystostomy tube placement 01/02/2023 EXAM: MRI ABDOMEN WITHOUT AND WITH CONTRAST (INCLUDING MRCP) TECHNIQUE: Multiplanar multisequence MR imaging of the abdomen was performed both before and after the administration of intravenous contrast. Heavily T2-weighted images of the biliary and pancreatic ducts were obtained. Post-processing was applied at the acquisition scanner with concurrent physician supervision which includes 3D reconstructions, MIPs, volume rendered images and/or shaded surface rendering. CONTRAST:  8.52mL GADAVIST GADOBUTROL 1 MMOL/ML IV SOLN COMPARISON:  MR abdomen dated 12/29/2022 FINDINGS: Decreased sensitivity and specificity for detailed findings due to motion artifact. Lower chest: Trace bilateral pleural effusions. Hepatobiliary: Parenchymal signal abnormality. Scattered subcentimeter T2 hyperintense foci, likely cysts. Increased mild to moderate intrahepatic bile duct dilation more prominent in the left hepatic lobe with new mural thickening of the extrahepatic bile duct (4:16) and enhancement (1304:45). The common bile duct is nondilated. Gallbladder is decompressed with catheter in-situ and demonstrates circumferential mural thickening. Pancreas: No mass or inflammatory changes. 3 mm T2 hyperintense cystic focus arises directly from the main pancreatic duct in the pancreatic neck (6:20) keeping with side branch intraductal papillary mucinous neoplasms (IPMN). No main ductal dilation, mass lesion, or abnormal enhancement. Spleen:  Within normal limits in size and appearance. Adrenals/Urinary Tract: No adrenal nodules. No suspicious renal masses  identified. No evidence of hydronephrosis. Stomach/Bowel: Small proximal duodenal diverticulum. Colonic diverticulosis without acute diverticulitis. Vascular/Lymphatic: No pathologically  enlarged lymph nodes identified. No abdominal aortic aneurysm demonstrated. Other:  Trace ascites. Musculoskeletal: No suspicious bone lesions identified. Susceptibility artifact related to median sternotomy wires. IMPRESSION: 1. Increased mild to moderate intrahepatic bile duct dilation, more prominent in the left hepatic lobe, with new mural thickening and enhancement of the extrahepatic bile duct, suspicious for cholangitis. 2. Decompressed gallbladder with catheter in-situ and circumferential mural thickening. 3. Trace bilateral pleural effusions and trace ascites. Electronically Signed   By: Agustin Cree M.D.   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 Patient now with worsening hyperbilirubinemia and jaundice and as such presents for cholangiogram and potential cholecystostomy tube exchange. EXAM: FLUOROSCOPIC GUIDED CHOLECYSTOSTOMY TUBE EXCHANGE COMPARISON:  MRCP-12/29/2022 Image guided cholecystostomy tube placement-01/02/2023 MEDICATIONS: None ANESTHESIA/SEDATION: None CONTRAST:  15mL OMNIPAQUE IOHEXOL 300 MG/ML SOLN - administered into the gallbladder lumen. FLUOROSCOPY TIME:  1 minute, 6 seconds (6.1 mGy) COMPLICATIONS: None immediate. PROCEDURE: The patient was positioned supine on the fluoroscopy table. The external portion of the existing cholecystostomy tube as well as the surrounding skin was prepped and draped in usual sterile fashion. A time-out was performed prior to the initiation of the procedure. A preprocedural spot fluoroscopic image was obtained of the right upper abdominal quadrant existing cholecystostomy tube. The  skin surrounding the cholecystostomy tube was anesthetized with 1% lidocaine with epinephrine. The external portion of the cholecystostomy tube was cut and cannulated with a short Amplatz wire which was advanced through the tube and coiled within the gallbladder lumen Next, under intermittent fluoroscopic guidance, the existing 10 [place] cholecystostomy tube was exchanged for a new, slightly larger now 10 [place] cholecystostomy tube which was repositioned into the more central aspect of the gallbladder lumen. Contrast injection confirms appropriate positioning and functionality of the cholecystomy tube. The cholecystostomy tube was flushed with a small amount of saline and reconnected to a gravity bag. The cholecystostomy tube was secured with an interrupted suture and a Stat Lock device. A dressing was applied. The patient tolerated the procedure well without immediate postprocedural complication. FINDINGS: Preprocedural spot fluoroscopic image demonstrates unchanged positioning of cholecystostomy tube with end coiled and locked over the expected location of the fundus of the gallbladder. Contrast injection demonstrates appropriate positioning and functionality of the existing cholecystostomy tube. There is opacification of the peripheral aspect of the cystic duct without passage of contrast to the level of the CBD. After fluoroscopic guided exchange, the new, 10 [place] cholecystostomy tube is appropriately positioned within the gallbladder lumen. Post exchange cholangiogram demonstrates appropriate positioning and functionality of the new cholecystostomy tube. IMPRESSION: Successful fluoroscopic guided exchange of 10 [place] cholecystostomy tube. PLAN: - Cholangiogram demonstrates persistent occlusion of the cystic duct without opacification of the CBD. 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 The patient is visibly jaundiced  with worsening hyperbilirubinemia (bilirubin - 3.9 on 11/11, 0.6 on the day of cholecystostomy tube placement) and as such the decision was made to escort the patient to the emergency department for further evaluation and management as the patient will likely require an ERCP to relieve his worsening biliary obstruction. Electronically Signed   By: Simonne Come M.D.   On: 01/24/2023 11:06        Scheduled Meds:  amLODipine  5 mg Oral Daily   divalproex  750 mg Oral BID   gabapentin  300 mg Oral QHS   metoprolol succinate  25 mg Oral Daily   sodium chloride flush  3 mL Intravenous Q12H   Continuous Infusions:  sodium chloride 100 mL/hr at 01/25/23 0023     LOS: 0 days    Time spent: 54 minutes spent on chart review, discussion with nursing staff, consultants, updating family and interview/physical exam; more than 50% of that time was spent in counseling and/or coordination of care.    Alvira Philips [place], DO [facility] Available via Epic secure chat 7am-7pm After these hours, please refer to coverage provider listed on [email or web address] 01/25/2023, 12:09 PM

## 2023-01-25 NOTE — Consult Note (Addendum)
Attending physician's note   I have taken a history, reviewed the chart, and examined the patient. I performed a substantive portion of this encounter, including complete performance of at least one of the key components, in conjunction with the APP. I agree with the APP's note, impression, and recommendations with my edits.   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  Underwent IR drain study yesterday which showed persistent occlusion of the cystic duct with concern for continued obstruction.  Liver enzymes uptrending including bilirubin of 20 and was directed for hospital admission.  MRI/MRCP with dilated intrahepatic ducts, new mural thickening of the extrahepatic bile duct, with nondilated CBD and GB decompressed with catheter in place.  Case discussed with surgical service;  no plan for cholecystectomy right now with potential further discussion with biliary surgery Monday.  Discussed role of ERCP with our advanced biliary service.  Given anatomy on MRCP, this would likely require wire advancement and stent placement into common hepatic duct to truly relieve obstruction.  He is otherwise hemodynamically stable.  - Hold Eliquis - Daily CBC and liver enzyme trend - Continue antimicrobial therapy for the time being - If acute change in clinical status, could consider IR consult for PTC - Ok to resume p.o. intake - GI service will continue to follow  Doristine Locks, DO, FACG [phone] office         Consultation  Referring Provider: [facility]/ Samuel Mayo Primary Care Physician:  [name], [name], MD Primary Gastroenterologist:  Samuel Mayo   Reason for Consultation: Jaundice, status post percutaneous cholecystostomy 01/02/2023  HPI: Samuel Mayo is a 77 y.o. male who initially presented to the  hospital on 12/30/2022 with acute abdominal pain which had been present for about 3 weeks, located in the right upper quadrant.. He does have history of atrial fibs flutter, chronically anticoagulated on Eliquis, hypertension, seizure disorder and is status post mitral valve annuloplasty. Workup at that time was consistent with acute cholecystitis. MRI/MRCP showed acute cholecystitis with small pericholecystic abscess adjacent to the gallbladder fundus and involving the left hepatic lobe, also noted mild diffuse intrahepatic biliary ductal dilation which could be secondary to acute cholecystitis/Mirizzi  syndrome. He was seen by surgery at that time and recommendation was for placement of cholecystostomy tube Percutaneous drain was placed on 01/02/2023.  He was discharged to home to complete a course of Augmentin.  Patient says he has not had any abdominal pain over the past few weeks though has not really been feeling well, and appetite has been decreased.  He has not been having any nausea or vomiting not aware of any fevers. He was seen in IR yesterday for a drain study and this showed persistent occlusion of the cystic duct and no opacification of his common bile duct.  Tube was exchanged, he was directed to the emergency room for admission. He had noticed that he had become jaundiced and that seems to have progressed over the past week.    MRI/MRCP yesterday showed increased mild to moderate intrahepatic bile duct dilation more prominent in the left hepatic lobe with new mural thickening and enhancement of the extrahepatic bile duct suspicious for cholangitis, decompressed gallbladder with catheter in situ and circumferential mural thickening.  The common bile duct is nondilated.  Labs showed sodium 127/potassium 3.8/BUN 18/creatinine 0.81 T. bili 20.9/alk phos 436/AST 144/ALT 136 WBC 5.0/hemoglobin  10.3/hematocrit 29.8  Last dose of Eliquis was yesterday morning 01/24/2023  Labs today [facility]  4.6/hemoglobin 8.9/hematocrit 25.5 T. bili 19.4/alk phos 428/AST 134/ALT 117  Past Medical History:  Diagnosis Date   AK (actinic keratosis)    Allergy    Chicken pox    Compression fracture of L1 lumbar vertebra (HCC)    Compression fracture of thoracic vertebra (HCC)    Hypertension    Lentigines    Follows with Samuel Mayo (derma)   Seizures (HCC)     Past Surgical History:  Procedure Laterality Date   CARDIOVERSION N/A 05/14/2021   Procedure: CARDIOVERSION;  Surgeon: Samuel Ishikawa, MD;  Location: [facility] ENDOSCOPY;  Service: Cardiovascular;  Laterality: N/A;   CARDIOVERSION N/A 10/08/2021   Procedure: CARDIOVERSION;  Surgeon: Samuel Constant, MD;  Location: [facility] ENDOSCOPY;  Service: Cardiovascular;  Laterality: N/A;   COLONOSCOPY     IR EXCHANGE BILIARY DRAIN  01/24/2023   IR PERC CHOLECYSTOSTOMY  01/02/2023   mitral vavle     Congetinal mitral valve disease.    Prior to Admission medications   Medication Sig Start Date End Date Taking? Authorizing Provider  amLODipine (NORVASC) 5 MG tablet TAKE 1 TABLET (5 MG TOTAL) BY MOUTH DAILY. 01/11/22  Yes [name], [name], MD  Cholecalciferol (VITAMIN D-3 PO) Take 1 capsule by mouth every evening.   Yes [provider]  divalproex (DEPAKOTE) 250 MG Samuel tablet Take 1 tablet (250 mg total) by mouth 2 (two) times daily. Take in addition to 500 mg twice a day (pt should have a total of 750 mg twice a day) 08/26/22  Yes Samuel Riley, MD  divalproex (DEPAKOTE) 500 MG Samuel tablet Take 1 tablet (500 mg total) by mouth 2 (two) times daily. 05/20/22  Yes Samuel Riley, MD  ELIQUIS 5 MG TABS tablet TAKE 1 TABLET BY MOUTH TWICE A DAY 08/07/22  Yes Mealor, Samuel Gaudy, MD  gabapentin (NEURONTIN) 300 MG capsule Take 1 capsule (300 mg total) by mouth at bedtime. 09/16/22  Yes Samuel Riley, MD  ibandronate (BONIVA) 150 MG tablet TAKE 1 TABLET BY MOUTH EVERY 30 DAYS. TAKE IN AM WITH FULL GLASS OF WATER ON EMPTY STOMACH AND DON'T TAKE  ANYTHING ELSE BY MOUTH OR LIE DOWN FOR THE NEXT 30 MINUTES Patient taking differently: Take 150 mg by mouth See admin instructions. Take 1 tablet (150mg ) once a month around the 20th. 07/22/22  Yes [name], [name], MD  LUTEIN PO Take 1 tablet by mouth every evening.   Yes [provider]  metoprolol succinate (TOPROL-XL) 25 MG 24 hr tablet TAKE 1 TABLET (25 MG TOTAL) BY MOUTH DAILY. 09/23/22  Yes BranchAlben Spittle, MD  Multiple Vitamin (MULTIVITAMIN WITH MINERALS) TABS tablet Take 1 tablet by mouth every evening.   Yes [provider]  OVER THE COUNTER MEDICATION Take 1 tablet by mouth 2 (two) times daily. Bone Up   Yes [provider]  sodium chloride flush 0.9 % SOLN injection 10 mLs by Intracatheter route as needed. Flush percutaneous cholecystostomy drain with 10 ml normal saline once daily. 01/03/23 02/02/23 Yes Mickie Kay, NP  amoxicillin-clavulanate (AUGMENTIN) 875-125 MG tablet Take 1 tablet by mouth 2 (two) times daily. Patient not taking: Reported on 01/24/2023    [provider]    Current Facility-Administered Medications  Medication Dose Route Frequency Provider Last Rate Last Admin   acetaminophen (TYLENOL) tablet 650 mg  650 mg Oral Q6H PRN Synetta Fail, MD  Or   acetaminophen (TYLENOL) suppository 650 mg  650 mg Rectal Q6H PRN Synetta Fail, MD       amLODipine (NORVASC) tablet 5 mg  5 mg Oral Daily Synetta Fail, MD   5 mg at 01/25/23 0901   diclofenac Sodium (VOLTAREN) 1 % topical gel 2 Mayo  2 Mayo Topical QID PRN Anthoney Harada, NP       divalproex (DEPAKOTE) Samuel tablet 750 mg  750 mg Oral BID Synetta Fail, MD   750 mg at 01/25/23 0901   gabapentin (NEURONTIN) capsule 300 mg  300 mg Oral QHS Synetta Fail, MD   300 mg at 01/24/23 2202   metoprolol succinate (TOPROL-XL) 24 hr tablet 25 mg  25 mg Oral Daily Synetta Fail, MD   25 mg at 01/25/23 0901   polyethylene glycol (MIRALAX / GLYCOLAX) packet 17 Mayo   17 Mayo Oral Daily PRN Synetta Fail, MD       sodium chloride flush (NS) 0.9 % injection 3 mL  3 mL Intravenous Q12H Synetta Fail, MD   3 mL at 01/24/23 2203    Allergies as of 01/24/2023 - Review Complete 01/24/2023  Allergen Reaction Noted   Other Other (See Comments) 09/16/2015    Family History  Problem Relation Age of Onset   Heart disease Mother    Cancer Father    Heart disease Father    Prostate cancer Father    Aneurysm Paternal Grandfather    Heart disease Son        congenital mitral valve dz   Colon cancer Neg Hx    Esophageal cancer Neg Hx    Stomach cancer Neg Hx    Rectal cancer Neg Hx     Social History   Socioeconomic History   Marital status: Domestic Partner    Spouse name: [name]   Number of children: Not on file   Years of education: Not on file   Highest education level: Bachelor's degree (e.Mayo., BA, AB, BS)  Occupational History   Not on file  Tobacco Use   Smoking status: Never   Smokeless tobacco: Never   Tobacco comments:    Never smoke 10/29/21   Vaping Use   Vaping status: Never Used  Substance and Sexual Activity   Alcohol use: Yes    Alcohol/week: 14.0 standard drinks of alcohol    Types: 14 Standard drinks or equivalent per week    Comment: 2 glasses of wine nightly 10/29/21   Drug use: No   Sexual activity: Not on file  Other Topics Concern   Not on file  Social History Narrative   Lives with wife [name]   R handed   Caffeine: 3 C of coffee AM   Social Determinants of Health   Financial Resource Strain: Low Risk  (10/21/2022)   Overall Financial Resource Strain (CARDIA)    Difficulty of Paying Living Expenses: Not hard at all  Food Insecurity: No Food Insecurity (01/06/2023)   Hunger Vital Sign    Worried About Running Out of Food in the Last Year: Never true    Ran Out of Food in the Last Year: Never true  Transportation Needs: No Transportation Needs (01/06/2023)   PRAPARE - Scientist, research (physical sciences) (Medical): No    Lack of Transportation (Non-Medical): No  Physical Activity: Insufficiently Active (10/21/2022)   Exercise Vital Sign    Days of Exercise per Week: 3 days    Minutes  of Exercise per Session: 40 min  Stress: Stress Concern Present (10/21/2022)   [employer] of Occupational Health - Occupational Stress Questionnaire    Feeling of Stress : Rather much  Social Connections: Moderately Integrated (10/21/2022)   Social Connection and Isolation Panel [NHANES]    Frequency of Communication with Friends and Family: Three times a week    Frequency of Social Gatherings with Friends and Family: Once a week    Attends Religious Services: Never    Database administrator or Organizations: No    Attends Engineer, structural: More than 4 times per year    Marital Status: Living with partner  Intimate Partner Violence: Not At Risk (12/30/2022)   Humiliation, Afraid, Rape, and Kick questionnaire    Fear of Current or Ex-Partner: No    Emotionally Abused: No    Physically Abused: No    Sexually Abused: No    Review of Systems: Pertinent positive and negative review of systems were noted in the above HPI section.  All other review of systems was otherwise negative.   Physical Exam: Vital signs in last 24 hours: Temp:  [97.7 F (36.5 C)-98.1 F (36.7 C)] 97.9 F (36.6 C) (11/23 0810) Pulse Rate:  [71-81] 81 (11/23 0810) Resp:  [14-21] 17 (11/23 0810) BP: (112-143)/(73-84) 120/84 (11/23 0810) SpO2:  [96 %-100 %] 97 % (11/23 0810) Last BM Date : 01/24/23 General:   Alert,  Well-developed, well-nourished elderly male, pleasant and cooperative in NAD, son at bedside-patient deeply icteric Head:  Normocephalic and atraumatic. Eyes:  Sclera icteric.   Conjunctiva pink. Ears:  Normal auditory acuity. Nose:  No deformity, discharge,  or lesions. Mouth:  No deformity or lesions.   Neck:  Supple; no masses or thyromegaly. Lungs:  Clear throughout to  auscultation.   No wheezes, crackles, or rhonchi.  Heart:  Regular rate and rhythm; no murmurs, clicks, rubs,  or gallops. Abdomen:  Soft,nontender, BS active,nonpalp mass or hsm.  Cholecystostomy tube in the right upper quadrant Rectal: Not done Msk:  Symmetrical without gross deformities. . Pulses:  Normal pulses noted. Extremities:  Without clubbing or edema. Neurologic:  Alert and  oriented x4;  grossly normal neurologically. Skin:  Intact without significant lesions or rashes.. Psych:  Alert and cooperative. Normal mood and affect.  Intake/Output from previous day: 11/22 0701 - 11/23 0700 In: 560.4 [I.V.:560.4] Out: 0  Intake/Output this shift: No intake/output data recorded.  Lab Results: Recent Labs    01/24/23 1020 01/25/23 0548  WBC 5.0 4.6  HGB 10.3* 8.9*  HCT 29.8* 25.5*  PLT 189 180   BMET Recent Labs    01/24/23 1020 01/25/23 0548  NA 127* 127*  K 3.8 4.0  CL 93* 96*  CO2 23 22  GLUCOSE 133* 83  BUN 18 11  CREATININE 0.81 0.80  CALCIUM 8.6* 7.9*   LFT Recent Labs    01/25/23 0548  PROT 4.9*  ALBUMIN 2.0*  AST 134*  ALT 117*  ALKPHOS 428*  BILITOT 19.4*   PT/INR Recent Labs    01/25/23 0548  LABPROT 15.0  INR 1.2   Hepatitis Panel No results for input(s): "HEPBSAG", "HCVAB", "HEPAIGM", "HEPBIGM" in the last 72 hours.    IMPRESSION:  #72 77 year old white male who presented initially on 12/30/2022 with acute cholecystitis, with Mirizzi syndrome Underwent percutaneous cholecystostomy drainage on 01/02/2023 and was discharged home on 01/03/2023 to complete a course of Augmentin presented yesterday for drain study which shows persistent occlusion of the  cystic duct tube was exchanged patient noted to be very jaundiced and admission recommended  Repeat MRI/MRCP shows a decompressed gallbladder with catheter in situ and persistent circumferential mural thickening, there is increased mild to moderate intrahepatic bile duct dilation more  prominent on the left and new mural thickening and enhancement of the extrahepatic bile duct suspicious for cholangitis common bile duct is decompressed  Pictures consistent with persistent Mirizzi with secondary obstruction of the intrahepatic ducts primarily on the left  #2 chronic anticoagulation-on Eliquis last dose a.m. 01/24/2023 on hold since #3 history of atrial fibrillation/atrial flutter #4 status post mitral valve annuloplasty #5.  Seizure disorder #6 hyponatremia   PLAN: Continue clear liquids today, n.p.o. after midnight Start IV Unasyn Repeat labs in a.m. Case was discussed with Samuel. Gerre Couch, and cholecystectomy is not a good option for this patient at this time, and surgery is recommending ERCP with goal to stent the left intrahepatic.  Procedure has been discussed in detail with the patient, including indications risks and benefits, reviewed biliary images with the patient. Will discuss with Samuel. Russella Dar who is covering for biliary this weekend, and patient may be able to be scheduled for ERCP tomorrow a.m.   Amy EsterwoodPA-C  01/25/2023, 12:22 PM

## 2023-01-26 DIAGNOSIS — G40909 Epilepsy, unspecified, not intractable, without status epilepticus: Secondary | ICD-10-CM | POA: Diagnosis present

## 2023-01-26 DIAGNOSIS — K820 Obstruction of gallbladder: Secondary | ICD-10-CM | POA: Diagnosis present

## 2023-01-26 DIAGNOSIS — D630 Anemia in neoplastic disease: Secondary | ICD-10-CM | POA: Diagnosis present

## 2023-01-26 DIAGNOSIS — I482 Chronic atrial fibrillation, unspecified: Secondary | ICD-10-CM | POA: Diagnosis not present

## 2023-01-26 DIAGNOSIS — K838 Other specified diseases of biliary tract: Secondary | ICD-10-CM | POA: Diagnosis not present

## 2023-01-26 DIAGNOSIS — Z7189 Other specified counseling: Secondary | ICD-10-CM | POA: Diagnosis not present

## 2023-01-26 DIAGNOSIS — R2689 Other abnormalities of gait and mobility: Secondary | ICD-10-CM | POA: Diagnosis not present

## 2023-01-26 DIAGNOSIS — I4892 Unspecified atrial flutter: Secondary | ICD-10-CM | POA: Diagnosis present

## 2023-01-26 DIAGNOSIS — R531 Weakness: Secondary | ICD-10-CM | POA: Diagnosis not present

## 2023-01-26 DIAGNOSIS — E440 Moderate protein-calorie malnutrition: Secondary | ICD-10-CM | POA: Diagnosis not present

## 2023-01-26 DIAGNOSIS — C787 Secondary malignant neoplasm of liver and intrahepatic bile duct: Secondary | ICD-10-CM | POA: Diagnosis not present

## 2023-01-26 DIAGNOSIS — I517 Cardiomegaly: Secondary | ICD-10-CM | POA: Diagnosis not present

## 2023-01-26 DIAGNOSIS — A4152 Sepsis due to Pseudomonas: Secondary | ICD-10-CM | POA: Diagnosis present

## 2023-01-26 DIAGNOSIS — R17 Unspecified jaundice: Secondary | ICD-10-CM | POA: Diagnosis not present

## 2023-01-26 DIAGNOSIS — Z952 Presence of prosthetic heart valve: Secondary | ICD-10-CM | POA: Diagnosis not present

## 2023-01-26 DIAGNOSIS — E872 Acidosis, unspecified: Secondary | ICD-10-CM | POA: Diagnosis present

## 2023-01-26 DIAGNOSIS — C220 Liver cell carcinoma: Secondary | ICD-10-CM | POA: Diagnosis not present

## 2023-01-26 DIAGNOSIS — L89152 Pressure ulcer of sacral region, stage 2: Secondary | ICD-10-CM | POA: Diagnosis not present

## 2023-01-26 DIAGNOSIS — E871 Hypo-osmolality and hyponatremia: Secondary | ICD-10-CM | POA: Diagnosis not present

## 2023-01-26 DIAGNOSIS — C221 Intrahepatic bile duct carcinoma: Secondary | ICD-10-CM | POA: Diagnosis not present

## 2023-01-26 DIAGNOSIS — E559 Vitamin D deficiency, unspecified: Secondary | ICD-10-CM | POA: Diagnosis not present

## 2023-01-26 DIAGNOSIS — K8309 Other cholangitis: Secondary | ICD-10-CM | POA: Diagnosis not present

## 2023-01-26 DIAGNOSIS — K819 Cholecystitis, unspecified: Secondary | ICD-10-CM | POA: Diagnosis not present

## 2023-01-26 DIAGNOSIS — I7121 Aneurysm of the ascending aorta, without rupture: Secondary | ICD-10-CM | POA: Diagnosis not present

## 2023-01-26 DIAGNOSIS — K5730 Diverticulosis of large intestine without perforation or abscess without bleeding: Secondary | ICD-10-CM | POA: Diagnosis not present

## 2023-01-26 DIAGNOSIS — R509 Fever, unspecified: Secondary | ICD-10-CM | POA: Diagnosis not present

## 2023-01-26 DIAGNOSIS — Z7901 Long term (current) use of anticoagulants: Secondary | ICD-10-CM | POA: Diagnosis not present

## 2023-01-26 DIAGNOSIS — C252 Malignant neoplasm of tail of pancreas: Secondary | ICD-10-CM | POA: Diagnosis not present

## 2023-01-26 DIAGNOSIS — I10 Essential (primary) hypertension: Secondary | ICD-10-CM | POA: Diagnosis not present

## 2023-01-26 DIAGNOSIS — M810 Age-related osteoporosis without current pathological fracture: Secondary | ICD-10-CM | POA: Diagnosis not present

## 2023-01-26 DIAGNOSIS — Z434 Encounter for attention to other artificial openings of digestive tract: Secondary | ICD-10-CM | POA: Diagnosis not present

## 2023-01-26 DIAGNOSIS — Z743 Need for continuous supervision: Secondary | ICD-10-CM | POA: Diagnosis not present

## 2023-01-26 DIAGNOSIS — A419 Sepsis, unspecified organism: Secondary | ICD-10-CM | POA: Diagnosis not present

## 2023-01-26 DIAGNOSIS — I4891 Unspecified atrial fibrillation: Secondary | ICD-10-CM | POA: Diagnosis not present

## 2023-01-26 DIAGNOSIS — D539 Nutritional anemia, unspecified: Secondary | ICD-10-CM | POA: Diagnosis not present

## 2023-01-26 DIAGNOSIS — C240 Malignant neoplasm of extrahepatic bile duct: Secondary | ICD-10-CM | POA: Diagnosis not present

## 2023-01-26 DIAGNOSIS — K810 Acute cholecystitis: Secondary | ICD-10-CM | POA: Diagnosis not present

## 2023-01-26 DIAGNOSIS — D62 Acute posthemorrhagic anemia: Secondary | ICD-10-CM | POA: Diagnosis not present

## 2023-01-26 DIAGNOSIS — C249 Malignant neoplasm of biliary tract, unspecified: Secondary | ICD-10-CM | POA: Diagnosis not present

## 2023-01-26 DIAGNOSIS — I480 Paroxysmal atrial fibrillation: Secondary | ICD-10-CM | POA: Diagnosis present

## 2023-01-26 DIAGNOSIS — Z515 Encounter for palliative care: Secondary | ICD-10-CM | POA: Diagnosis not present

## 2023-01-26 DIAGNOSIS — N179 Acute kidney failure, unspecified: Secondary | ICD-10-CM | POA: Diagnosis not present

## 2023-01-26 DIAGNOSIS — Z66 Do not resuscitate: Secondary | ICD-10-CM | POA: Diagnosis not present

## 2023-01-26 DIAGNOSIS — E222 Syndrome of inappropriate secretion of antidiuretic hormone: Secondary | ICD-10-CM | POA: Diagnosis not present

## 2023-01-26 DIAGNOSIS — Z7401 Bed confinement status: Secondary | ICD-10-CM | POA: Diagnosis not present

## 2023-01-26 DIAGNOSIS — R6520 Severe sepsis without septic shock: Secondary | ICD-10-CM | POA: Diagnosis not present

## 2023-01-26 DIAGNOSIS — R569 Unspecified convulsions: Secondary | ICD-10-CM | POA: Diagnosis not present

## 2023-01-26 DIAGNOSIS — I959 Hypotension, unspecified: Secondary | ICD-10-CM | POA: Diagnosis not present

## 2023-01-26 DIAGNOSIS — C7951 Secondary malignant neoplasm of bone: Secondary | ICD-10-CM | POA: Diagnosis present

## 2023-01-26 DIAGNOSIS — J9811 Atelectasis: Secondary | ICD-10-CM | POA: Diagnosis not present

## 2023-01-26 DIAGNOSIS — E43 Unspecified severe protein-calorie malnutrition: Secondary | ICD-10-CM | POA: Diagnosis not present

## 2023-01-26 DIAGNOSIS — K831 Obstruction of bile duct: Secondary | ICD-10-CM | POA: Diagnosis not present

## 2023-01-26 DIAGNOSIS — R7401 Elevation of levels of liver transaminase levels: Secondary | ICD-10-CM | POA: Diagnosis not present

## 2023-01-26 DIAGNOSIS — K851 Biliary acute pancreatitis without necrosis or infection: Secondary | ICD-10-CM | POA: Diagnosis not present

## 2023-01-26 DIAGNOSIS — M6281 Muscle weakness (generalized): Secondary | ICD-10-CM | POA: Diagnosis not present

## 2023-01-26 DIAGNOSIS — I700 Atherosclerosis of aorta: Secondary | ICD-10-CM | POA: Diagnosis not present

## 2023-01-26 LAB — COMPREHENSIVE METABOLIC PANEL
ALT: 131 U/L — ABNORMAL HIGH (ref 0–44)
AST: 142 U/L — ABNORMAL HIGH (ref 15–41)
Albumin: 2 g/dL — ABNORMAL LOW (ref 3.5–5.0)
Alkaline Phosphatase: 470 U/L — ABNORMAL HIGH (ref 38–126)
Anion gap: 9 (ref 5–15)
BUN: 8 mg/dL (ref 8–23)
CO2: 22 mmol/L (ref 22–32)
Calcium: 7.8 mg/dL — ABNORMAL LOW (ref 8.9–10.3)
Chloride: 94 mmol/L — ABNORMAL LOW (ref 98–111)
Creatinine, Ser: 0.83 mg/dL (ref 0.61–1.24)
GFR, Estimated: >60 mL/min (ref >60)
Glucose, Bld: 101 mg/dL — ABNORMAL HIGH (ref 70–99)
Potassium: 3.3 mmol/L — ABNORMAL LOW (ref 3.5–5.1)
Sodium: 125 mmol/L — ABNORMAL LOW (ref 135–145)
Total Bilirubin: 20.8 mg/dL (ref <1.2)
Total Protein: 5.1 g/dL — ABNORMAL LOW (ref 6.5–8.1)

## 2023-01-26 LAB — LIPASE, BLOOD: Lipase: 737 U/L — ABNORMAL HIGH (ref 11–51)

## 2023-01-26 MED ORDER — POTASSIUM CHLORIDE CRYS ER 20 MEQ PO TBCR
30.0000 meq | EXTENDED_RELEASE_TABLET | ORAL | Status: AC
  Administered 2023-01-26 (×2): 30 meq via ORAL
  Filled 2023-01-26 (×2): qty 1

## 2023-01-26 NOTE — Progress Notes (Signed)
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 Barron Alvine and Dr. Russella Dar - plan evaluation by Dr. Sophronia Simas (hepatobiliary surgery) on Monday 11/25   hx of MVR w/ annuloplasty Afib on Eliquis HTN Seizure disorder    FEN: Clear liquid diet ID: none currently VTE: SCDs   I reviewed last 24 h vitals and pain scores, last 48 h intake and output, last 24 h labs and trends, and last 24 h imaging results.  Discussed options for management with patient and his wife.  I have discussed as well with Dr. Eliberto Ivory and with Mike Gip from GI.  Hopefully Dr. Freida Busman and Dr. Meridee Score can evaluate and discuss on Monday 11/25 and arrive at a plan for management of this complex case.  Patient and wife understand.          Darnell Level, MD [facility] A [facility] practice Office: [phone]        Chief Complaint: Jaundice  Subjective: Patient in bed, comfortable.  Wife at bedside.  Objective: Vital signs in last 24 hours: Temp:  [97.5 F (36.4 C)-98.4 F (36.9 C)] 97.5 F (36.4 C) (11/24 0759) Pulse Rate:  [71-79] 76 (11/24 0759) Resp:  [16-18] 16 (11/24 0759) BP: (104-122)/(75-89) 115/82 (11/24 0759) SpO2:  [97 %-100 %] 98 % (11/24 0759) Last BM Date : 01/24/23  Intake/Output from previous day: 11/23 0701 - 11/24 0700 In: 300 [IV Piggyback:300] Out: 90 [Drains:90] Intake/Output this shift: No intake/output data recorded.  Physical Exam: HEENT - sclerae icterus Abdomen - soft, non-tender; no mass Ext - no edema, non-tender  Lab Results:  Recent Labs    01/24/23 1020 01/25/23 0548  WBC 5.0 4.6  HGB 10.3* 8.9*   HCT 29.8* 25.5*  PLT 189 180   BMET Recent Labs    01/25/23 0548 01/26/23 0627  NA 127* 125*  K 4.0 3.3*  CL 96* 94*  CO2 22 22  GLUCOSE 83 101*  BUN 11 8  CREATININE 0.80 0.83  CALCIUM 7.9* 7.8*   PT/INR Recent Labs    01/25/23 0548  LABPROT 15.0  INR 1.2   Comprehensive Metabolic Panel:    Component Value Date/Time   NA 125 (L) 01/26/2023 0627   NA 127 (L) 01/25/2023 0548   NA 141 05/20/2022 1026   NA 137 10/05/2021 1120   NA 136 02/03/2012 1915   K 3.3 (L) 01/26/2023 0627   K 4.0 01/25/2023 0548   K 3.8 02/03/2012 1915   CL 94 (L) 01/26/2023 0627   CL 96 (L) 01/25/2023 0548   CL 102 02/03/2012 1915   CO2 22 01/26/2023 0627   CO2 22 01/25/2023 0548   CO2 27 02/03/2012 1915   BUN 8 01/26/2023 0627   BUN 11 01/25/2023 0548   BUN 24 05/20/2022 1026   BUN 23 10/05/2021 1120   BUN 18 02/03/2012 1915   CREATININE 0.83 01/26/2023 0627   CREATININE 0.80 01/25/2023 0548   CREATININE 0.90 02/03/2012 1915   GLUCOSE 101 (H) 01/26/2023 0627   GLUCOSE 83 01/25/2023 0548   GLUCOSE 87 02/03/2012 1915   CALCIUM 7.8 (L) 01/26/2023 0627   CALCIUM 7.9 (L) 01/25/2023 0548   CALCIUM  8.2 (L) 02/03/2012 1915   AST 142 (H) 01/26/2023 0627   AST 134 (H) 01/25/2023 0548   AST 31 02/03/2012 1915   ALT 131 (H) 01/26/2023 0627   ALT 117 (H) 01/25/2023 0548   ALT 31 02/03/2012 1915   ALKPHOS 470 (H) 01/26/2023 0627   ALKPHOS 428 (H) 01/25/2023 0548   ALKPHOS 75 02/03/2012 1915   BILITOT 20.8 (HH) 01/26/2023 0627   BILITOT 19.4 (HH) 01/25/2023 0548   BILITOT 0.4 05/20/2022 1026   BILITOT 0.5 07/19/2021 1128   BILITOT 0.5 02/03/2012 1915   PROT 5.1 (L) 01/26/2023 0627   PROT 4.9 (L) 01/25/2023 0548   PROT 5.9 (L) 05/20/2022 1026   PROT 6.4 07/19/2021 1128   PROT 6.7 02/03/2012 1915   ALBUMIN 2.0 (L) 01/26/2023 0627   ALBUMIN 2.0 (L) 01/25/2023 0548   ALBUMIN 3.9 05/20/2022 1026   ALBUMIN 4.6 07/19/2021 1128   ALBUMIN 3.9 02/03/2012 1915    Studies/Results: MR  ABDOMEN MRCP W WO CONTAST  Result Date: 01/24/2023 CLINICAL DATA:  Jaundice. History of acute cholecystitis status post percutaneous cholecystostomy tube placement 01/02/2023 EXAM: MRI ABDOMEN WITHOUT AND WITH CONTRAST (INCLUDING MRCP) TECHNIQUE: Multiplanar multisequence MR imaging of the abdomen was performed both before and after the administration of intravenous contrast. Heavily T2-weighted images of the biliary and pancreatic ducts were obtained. Post-processing was applied at the acquisition scanner with concurrent physician supervision which includes 3D reconstructions, MIPs, volume rendered images and/or shaded surface rendering. CONTRAST:  8.3mL GADAVIST GADOBUTROL 1 MMOL/ML IV SOLN COMPARISON:  MR abdomen dated 12/29/2022 FINDINGS: Decreased sensitivity and specificity for detailed findings due to motion artifact. Lower chest: Trace bilateral pleural effusions. Hepatobiliary: Parenchymal signal abnormality. Scattered subcentimeter T2 hyperintense foci, likely cysts. Increased mild to moderate intrahepatic bile duct dilation more prominent in the left hepatic lobe with new mural thickening of the extrahepatic bile duct (4:16) and enhancement (1304:45). The common bile duct is nondilated. Gallbladder is decompressed with catheter in-situ and demonstrates circumferential mural thickening. Pancreas: No mass or inflammatory changes. 3 mm T2 hyperintense cystic focus arises directly from the main pancreatic duct in the pancreatic neck (6:20) keeping with side branch intraductal papillary mucinous neoplasms (IPMN). No main ductal dilation, mass lesion, or abnormal enhancement. Spleen:  Within normal limits in size and appearance. Adrenals/Urinary Tract: No adrenal nodules. No suspicious renal masses identified. No evidence of hydronephrosis. Stomach/Bowel: Small proximal duodenal diverticulum. Colonic diverticulosis without acute diverticulitis. Vascular/Lymphatic: No pathologically enlarged lymph nodes  identified. No abdominal aortic aneurysm demonstrated. Other:  Trace ascites. Musculoskeletal: No suspicious bone lesions identified. Susceptibility artifact related to median sternotomy wires. IMPRESSION: 1. Increased mild to moderate intrahepatic bile duct dilation, more prominent in the left hepatic lobe, with new mural thickening and enhancement of the extrahepatic bile duct, suspicious for cholangitis. 2. Decompressed gallbladder with catheter in-situ and circumferential mural thickening. 3. Trace bilateral pleural effusions and trace ascites. Electronically Signed   By: Agustin Cree M.D.   On: 01/24/2023 15:54   MR 3D Recon At Scanner  Result Date: 01/24/2023 CLINICAL DATA:  Jaundice. History of acute cholecystitis status post percutaneous cholecystostomy tube placement 01/02/2023 EXAM: MRI ABDOMEN WITHOUT AND WITH CONTRAST (INCLUDING MRCP) TECHNIQUE: Multiplanar multisequence MR imaging of the abdomen was performed both before and after the administration of intravenous contrast. Heavily T2-weighted images of the biliary and pancreatic ducts were obtained. Post-processing was applied at the acquisition scanner with concurrent physician supervision which includes 3D reconstructions, MIPs, volume rendered images and/or shaded surface rendering. CONTRAST:  8.73mL GADAVIST GADOBUTROL 1 MMOL/ML IV SOLN COMPARISON:  MR abdomen dated 12/29/2022 FINDINGS: Decreased sensitivity and specificity for detailed findings due to motion artifact. Lower chest: Trace bilateral pleural effusions. Hepatobiliary: Parenchymal signal abnormality. Scattered subcentimeter T2 hyperintense foci, likely cysts. Increased mild to moderate intrahepatic bile duct dilation more prominent in the left hepatic lobe with new mural thickening of the extrahepatic bile duct (4:16) and enhancement (1304:45). The common bile duct is nondilated. Gallbladder is decompressed with catheter in-situ and demonstrates circumferential mural thickening.  Pancreas: No mass or inflammatory changes. 3 mm T2 hyperintense cystic focus arises directly from the main pancreatic duct in the pancreatic neck (6:20) keeping with side branch intraductal papillary mucinous neoplasms (IPMN). No main ductal dilation, mass lesion, or abnormal enhancement. Spleen:  Within normal limits in size and appearance. Adrenals/Urinary Tract: No adrenal nodules. No suspicious renal masses identified. No evidence of hydronephrosis. Stomach/Bowel: Small proximal duodenal diverticulum. Colonic diverticulosis without acute diverticulitis. Vascular/Lymphatic: No pathologically enlarged lymph nodes identified. No abdominal aortic aneurysm demonstrated. Other:  Trace ascites. Musculoskeletal: No suspicious bone lesions identified. Susceptibility artifact related to median sternotomy wires. IMPRESSION: 1. Increased mild to moderate intrahepatic bile duct dilation, more prominent in the left hepatic lobe, with new mural thickening and enhancement of the extrahepatic bile duct, suspicious for cholangitis. 2. Decompressed gallbladder with catheter in-situ and circumferential mural thickening. 3. Trace bilateral pleural effusions and trace ascites. Electronically Signed   By: Agustin Cree M.D.   On: 01/24/2023 15:54      Darnell Level 01/26/2023  Patient ID: Samuel Mayo, male   DOB: [birth year], 77 y.o.   MRN: [id number]

## 2023-01-26 NOTE — Progress Notes (Signed)
PROGRESS NOTE    Samuel Mayo  XBJ:[id number] DOB: 07-Sep-1945 DOA: 01/24/2023 PCP: [name], [name], MD    Brief Narrative:   Samuel Mayo is a 77 y.o. male with past medical history significant for  hypertension, atrial fibrillation, seizures, gout, MVR status post annuloplasty presenting with worsening jaundice.   Patient was admitted from 10/28 until 11/1.  Had cholecystitis but underwent cholecystostomy tube in setting of cholecystectomy.  Diagnosed with likely Mirizzi syndrome as there is no obstructing stone noted.  Follow-up outpatient and was noted to have worsening LFTs.  Was reevaluated by IR and was seen day of admission for reevaluation of gallbladder and underwent cholecystostomy tube exchange. During imaging today patient noted to have persistent occlusion of the cystic duct with out opacification consistent with 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.  Given worsening laboratory abnormalities patient recommended to proceed to the ED for further evaluation.   Patient reports some lethargy and decreased appetite but denies fever, chills, chest pain, shortness of breath, abdominal pain, constipation, diarrhea, nausea, vomiting.   In the ED, temperature 98.1 F, HR 92, RR 16, BP 118/83, SpO2 98% on room air.  WBC 5.0, hemoglobin 10.3, platelet count 189.  Sodium 127, potassium 3.8, chloride 93, CO2 23, glucose 133, BUN 18, creatinine 0.81.  Lipase 1228.  AST 144, ALT 136, total bilirubin 20.9.  Urinalysis with moderate bilirubin, otherwise unrevealing.  INR 1.2.  Patient received IV fluid bolus in the ED.  General surgery consulted with recommendation of MRCP.  TRH consulted for admission for further evaluation management of obstructive jaundice.   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   Underwent exchange of cholecystostomy tube.  During procedure noted persistent occlusion of the cystic duct without opacification consistent with Mirizzi syndrome not responding appropriately to cholecystostomy.  MRCP with increased mild/moderate intrahepatic bile duct dilation, more prominent left hepatic lobe with new mural thickening, enhancement of the head intrahepatic bile duct suspicious for cholangitis, decompressed gallbladder with catheter in place and circumferential mural thickening. -- General Surgery and [facility] following, appreciate assistance -- AST [id number] -- ALT 136>117>131 -- Tbili 20.9>19.4>20.8 -- Flush drain every 12 hours, monitor drain output every shift -- Unasyn 1.5 g IV every 8 hours -- CMP daily -- Further per GI/general surgery; per GI, Dr. Barron Alvine; awaiting formulation of a plan with general surgery biliary service and GI regarding definitive treatment in this complex case due to patient's anatomy  Hyponatremia Baseline sodium over the past 1 month 128-132.  Sodium 127 on admission, close to baseline. -- Monitor CMP daily  Essential hypertension -- Amlodipine 5 mg p.o. daily -- Metoprolol succinate 25 mg p.o. daily  Paroxysmal atrial fibrillation -- Metoprolol succinate 25 mg p.o. daily -- Holding Eliquis for need of likely ERCP  Seizure disorder Valproic acid level -- Depakote 750 mg.  Twice daily  History of MVR s/p annuloplasty Continue outpatient follow-up with cardiology   DVT prophylaxis: SCDs Start: 01/24/23 1337    Code Status: Full Code Family Communication:   Disposition Plan:  Level of care: Telemetry Medical Status is: Observation The patient remains OBS appropriate and will d/c before 2 midnights.    Consultants:  General surgery [facility]  Procedures:  Cholecystostomy exchange by IR on 11/22  Antimicrobials:  Unasyn 11/23>>   Subjective: Patient seen examined bedside, resting comfortably.  Lying  in bed.  No family present.  Drinking coffee.  No specific complaints or concerns this morning.  Discussed with general surgery, Dr. Gerrit Friends this morning; awaiting conference between biliary surgeons and gastroenterology to determine management course given the patient's complex anatomy.   Patient denies headache, no dizziness, no chest pain, no shortness of breath, no abdominal pain, no fever/chills/night sweats, no nausea/vomiting/diarrhea, no focal weakness, no fatigue, no paresthesia.  No acute events overnight per nursing.  Objective: Vitals:   01/25/23 1602 01/25/23 2007 01/26/23 0317 01/26/23 0759  BP: 116/75 122/89 104/77 115/82  Pulse: 71 75 79 76  Resp: 16 18 18 16   Temp: 98 F (36.7 C) 98.4 F (36.9 C) 97.9 F (36.6 C) (!) 97.5 F (36.4 C)  TempSrc: Oral Oral Oral Oral  SpO2: 100% 98% 97% 98%  Weight:      Height:        Intake/Output Summary (Last 24 hours) at 01/26/2023 1132 Last data filed at 01/26/2023 0551 Gross per 24 hour  Intake 300 ml  Output 90 ml  Net 210 ml   Filed Weights   01/24/23 0953  Weight: 86.2 kg    Examination:  Physical Exam: GEN: NAD, alert and oriented x 3, wd/wn, + jaundice HEENT: NCAT, PERRL, EOMI, + scleral icterus, MMM PULM: CTAB w/o wheezes/crackles, normal respiratory effort, on room air CV: RRR w/o M/G/R GI: abd soft, NTND, NABS, no R/G/M, noted cholecystostomy drain right side with bilious fluid in collection bag MSK: no peripheral edema, muscle strength globally intact 5/5 bilateral upper/lower extremities NEURO: CN II-XII intact, no focal deficits, sensation to light touch intact PSYCH: normal mood/affect Integumentary: Diffuse jaundice skin, otherwise no other concerning rashes/lesions/wounds noted on exposed skin surfaces dry/intact, no rashes or wounds    Data Reviewed: I have personally reviewed following labs and imaging studies  CBC: Recent Labs  Lab 01/24/23 1020 01/25/23 0548  WBC 5.0 4.6  NEUTROABS 3.1  --    HGB 10.3* 8.9*  HCT 29.8* 25.5*  MCV 96.1 95.5  PLT 189 180   Basic Metabolic Panel: Recent Labs  Lab 01/24/23 1020 01/25/23 0548 01/26/23 0627  NA 127* 127* 125*  K 3.8 4.0 3.3*  CL 93* 96* 94*  CO2 23 22 22   GLUCOSE 133* 83 101*  BUN 18 11 8   CREATININE 0.81 0.80 0.83  CALCIUM 8.6* 7.9* 7.8*   GFR: Estimated Creatinine Clearance: 78.1 mL/min (by C-G formula based on SCr of 0.83 mg/dL). Liver Function Tests: Recent Labs  Lab 01/24/23 1020 01/25/23 0548 01/26/23 0627  AST 144* 134* 142*  ALT 136* 117* 131*  ALKPHOS 436* 428* 470*  BILITOT 20.9* 19.4* 20.8*  PROT 6.0* 4.9* 5.1*  ALBUMIN 2.5* 2.0* 2.0*   Recent Labs  Lab 01/24/23 1020 01/26/23 0627  LIPASE 1,228* 737*   No results for input(s): "AMMONIA" in the last 168 hours. Coagulation Profile: Recent Labs  Lab 01/25/23 0548  INR 1.2   Cardiac Enzymes: No results for input(s): "CKTOTAL", "CKMB", "CKMBINDEX", "TROPONINI" in the last 168 hours. BNP (last 3 results) No results for input(s): "PROBNP" in the last 8760 hours. HbA1C: No results for input(s): "HGBA1C" in the last 72 hours. CBG: No results for input(s): "GLUCAP" in the last 168 hours. Lipid Profile: No results for input(s): "CHOL", "HDL", "LDLCALC", "TRIG", "CHOLHDL", "LDLDIRECT" in the last 72 hours. Thyroid Function Tests: No results for input(s): "TSH", "T4TOTAL", "FREET4", "T3FREE", "THYROIDAB" in the last 72 hours. Anemia Panel: No results for input(s): "VITAMINB12", "FOLATE", "FERRITIN", "TIBC", "IRON", "RETICCTPCT" in  the last 72 hours. Sepsis Labs: No results for input(s): "PROCALCITON", "LATICACIDVEN" in the last 168 hours.  No results found for this or any previous visit (from the past 240 hour(s)).       Radiology Studies: MR ABDOMEN MRCP W WO CONTAST  Result Date: 01/24/2023 CLINICAL DATA:  Jaundice. History of acute cholecystitis status post percutaneous cholecystostomy tube placement 01/02/2023 EXAM: MRI ABDOMEN  WITHOUT AND WITH CONTRAST (INCLUDING MRCP) TECHNIQUE: Multiplanar multisequence MR imaging of the abdomen was performed both before and after the administration of intravenous contrast. Heavily T2-weighted images of the biliary and pancreatic ducts were obtained. Post-processing was applied at the acquisition scanner with concurrent physician supervision which includes 3D reconstructions, MIPs, volume rendered images and/or shaded surface rendering. CONTRAST:  8.59mL GADAVIST GADOBUTROL 1 MMOL/ML IV SOLN COMPARISON:  MR abdomen dated 12/29/2022 FINDINGS: Decreased sensitivity and specificity for detailed findings due to motion artifact. Lower chest: Trace bilateral pleural effusions. Hepatobiliary: Parenchymal signal abnormality. Scattered subcentimeter T2 hyperintense foci, likely cysts. Increased mild to moderate intrahepatic bile duct dilation more prominent in the left hepatic lobe with new mural thickening of the extrahepatic bile duct (4:16) and enhancement (1304:45). The common bile duct is nondilated. Gallbladder is decompressed with catheter in-situ and demonstrates circumferential mural thickening. Pancreas: No mass or inflammatory changes. 3 mm T2 hyperintense cystic focus arises directly from the main pancreatic duct in the pancreatic neck (6:20) keeping with side branch intraductal papillary mucinous neoplasms (IPMN). No main ductal dilation, mass lesion, or abnormal enhancement. Spleen:  Within normal limits in size and appearance. Adrenals/Urinary Tract: No adrenal nodules. No suspicious renal masses identified. No evidence of hydronephrosis. Stomach/Bowel: Small proximal duodenal diverticulum. Colonic diverticulosis without acute diverticulitis. Vascular/Lymphatic: No pathologically enlarged lymph nodes identified. No abdominal aortic aneurysm demonstrated. Other:  Trace ascites. Musculoskeletal: No suspicious bone lesions identified. Susceptibility artifact related to median sternotomy wires.  IMPRESSION: 1. Increased mild to moderate intrahepatic bile duct dilation, more prominent in the left hepatic lobe, with new mural thickening and enhancement of the extrahepatic bile duct, suspicious for cholangitis. 2. Decompressed gallbladder with catheter in-situ and circumferential mural thickening. 3. Trace bilateral pleural effusions and trace ascites. Electronically Signed   By: Agustin Cree M.D.   On: 01/24/2023 15:54   MR 3D Recon At Scanner  Result Date: 01/24/2023 CLINICAL DATA:  Jaundice. History of acute cholecystitis status post percutaneous cholecystostomy tube placement 01/02/2023 EXAM: MRI ABDOMEN WITHOUT AND WITH CONTRAST (INCLUDING MRCP) TECHNIQUE: Multiplanar multisequence MR imaging of the abdomen was performed both before and after the administration of intravenous contrast. Heavily T2-weighted images of the biliary and pancreatic ducts were obtained. Post-processing was applied at the acquisition scanner with concurrent physician supervision which includes 3D reconstructions, MIPs, volume rendered images and/or shaded surface rendering. CONTRAST:  8.25mL GADAVIST GADOBUTROL 1 MMOL/ML IV SOLN COMPARISON:  MR abdomen dated 12/29/2022 FINDINGS: Decreased sensitivity and specificity for detailed findings due to motion artifact. Lower chest: Trace bilateral pleural effusions. Hepatobiliary: Parenchymal signal abnormality. Scattered subcentimeter T2 hyperintense foci, likely cysts. Increased mild to moderate intrahepatic bile duct dilation more prominent in the left hepatic lobe with new mural thickening of the extrahepatic bile duct (4:16) and enhancement (1304:45). The common bile duct is nondilated. Gallbladder is decompressed with catheter in-situ and demonstrates circumferential mural thickening. Pancreas: No mass or inflammatory changes. 3 mm T2 hyperintense cystic focus arises directly from the main pancreatic duct in the pancreatic neck (6:20) keeping with side branch intraductal papillary  mucinous neoplasms (IPMN). No main ductal dilation,  mass lesion, or abnormal enhancement. Spleen:  Within normal limits in size and appearance. Adrenals/Urinary Tract: No adrenal nodules. No suspicious renal masses identified. No evidence of hydronephrosis. Stomach/Bowel: Small proximal duodenal diverticulum. Colonic diverticulosis without acute diverticulitis. Vascular/Lymphatic: No pathologically enlarged lymph nodes identified. No abdominal aortic aneurysm demonstrated. Other:  Trace ascites. Musculoskeletal: No suspicious bone lesions identified. Susceptibility artifact related to median sternotomy wires. IMPRESSION: 1. Increased mild to moderate intrahepatic bile duct dilation, more prominent in the left hepatic lobe, with new mural thickening and enhancement of the extrahepatic bile duct, suspicious for cholangitis. 2. Decompressed gallbladder with catheter in-situ and circumferential mural thickening. 3. Trace bilateral pleural effusions and trace ascites. Electronically Signed   By: Agustin Cree M.D.   On: 01/24/2023 15:54        Scheduled Meds:  amLODipine  5 mg Oral Daily   divalproex  750 mg Oral BID   gabapentin  300 mg Oral QHS   metoprolol succinate  25 mg Oral Daily   potassium chloride  30 mEq Oral Q3H   sodium chloride flush  3 mL Intravenous Q12H   Continuous Infusions:  ampicillin-sulbactam (UNASYN) IV 1.5 g (01/26/23 1008)     LOS: 0 days    Time spent: 54 minutes spent on chart review, discussion with nursing staff, consultants, updating family and interview/physical exam; more than 50% of that time was spent in counseling and/or coordination of care.    Alvira Philips [place], DO [facility] Available via Epic secure chat 7am-7pm After these hours, please refer to coverage provider listed on [email or web address] 01/26/2023, 11:32 AM

## 2023-01-26 NOTE — Plan of Care (Signed)
  Problem: Education: Goal: Knowledge of General Education information will improve Description: Including pain rating scale, medication(s)/side effects and non-pharmacologic comfort measures Outcome: Progressing   Problem: Health Behavior/Discharge Planning: Goal: Ability to manage health-related needs will improve Outcome: Progressing   Problem: Clinical Measurements: Goal: Will remain free from infection Outcome: Progressing Goal: Diagnostic test results will improve Outcome: Progressing   Problem: Elimination: Goal: Will not experience complications related to bowel motility Outcome: Progressing Goal: Will not experience complications related to urinary retention Outcome: Progressing   Problem: Pain Management: Goal: General experience of comfort will improve Outcome: Progressing

## 2023-01-26 NOTE — Progress Notes (Addendum)
Patient ID: Samuel Mayo, male   DOB: 03-Jan-1946, 77 y.o.   MRN: [id number]     Attending physician's note   I have taken a history, reviewed the chart, and examined the patient. I performed a substantive portion of this encounter, including complete performance of at least one of the key components, in conjunction with the APP. I agree with the APP's note, impression, and recommendations with my edits.   Case discussed with GI biliary service and surgical service yesterday.  In-depth conversation with patient and spouse at bedside today.  Ultimately, plan is for his very complex case to be reviewed by Dr. Freida Busman from the Biliary Surgery service and Dr. Meridee Score from the [facility] service tomorrow when available.   Ronn Smolinsky, DO, FACG [phone] office          Progress Note   Subjective  Day # 2 CC; new onset jaundice, status post percutaneous cholecystostomy 01/02/2023  IV Unasyn initiated 01/25/2023  Eliquis on hold since admission  MRI/MRCP 01/24/2023 with increased mild to moderate intrahepatic biliary ductal dilation more prominent in the left hepatic lobe new mural thickening enhancement of the extrahepatic bile duct concerning for cholangitis, decompressed gallbladder, cholecystostomy tube in situ, circumferential mural thickening, CBD not dilated  Lipase 737 Sodium 125/potassium 3.3/UN 8/creatinine 0.83 T. bili 20.8/alk phos 470/AST 142/ALT 131   Patient says he feels fine, no abdominal pain, no nausea, eating without difficulty, has been afebrile   Objective   Vital signs in last 24 hours: Temp:  [97.5 F (36.4 C)-98.4 F (36.9 C)] 97.5 F (36.4 C) (11/24 0759) Pulse Rate:  [71-79] 76 (11/24 0759) Resp:  [16-18] 16 (11/24 0759) BP: (104-122)/(75-89) 115/82 (11/24 0759) SpO2:  [97 %-100 %] 98 % (11/24 0759) Last BM Date : 01/24/23 General:    Elderly white male in NAD pleat icteric Heart:  Regular rate and rhythm; no  murmurs Lungs: Respirations even and unlabored, lungs CTA bilaterally Abdomen:  Soft, nontender and nondistended. Normal bowel sounds.  Cholecystostomy tube in the right upper quadrant  Extremities:  Without edema. Neurologic:  Alert and oriented,  grossly normal neurologically. Psych:  Cooperative. Normal mood and affect.  Intake/Output from previous day: 11/23 0701 - 11/24 0700 In: 300 [IV Piggyback:300] Out: 90 [Drains:90] Intake/Output this shift: No intake/output data recorded.  Lab Results: Recent Labs    01/24/23 1020 01/25/23 0548  WBC 5.0 4.6  HGB 10.3* 8.9*  HCT 29.8* 25.5*  PLT 189 180   BMET Recent Labs    01/24/23 1020 01/25/23 0548 01/26/23 0627  NA 127* 127* 125*  K 3.8 4.0 3.3*  CL 93* 96* 94*  CO2 23 22 22   GLUCOSE 133* 83 101*  BUN 18 11 8   CREATININE 0.81 0.80 0.83  CALCIUM 8.6* 7.9* 7.8*   LFT Recent Labs    01/26/23 0627  PROT 5.1*  ALBUMIN 2.0*  AST 142*  ALT 131*  ALKPHOS 470*  BILITOT 20.8*   PT/INR Recent Labs    01/25/23 0548  LABPROT 15.0  INR 1.2    Studies/Results: MR ABDOMEN MRCP W WO CONTAST  Result Date: 01/24/2023 CLINICAL DATA:  Jaundice. History of acute cholecystitis status post percutaneous cholecystostomy tube placement 01/02/2023 EXAM: MRI ABDOMEN WITHOUT AND WITH CONTRAST (INCLUDING MRCP) TECHNIQUE: Multiplanar multisequence MR imaging of the abdomen was performed both before and after the administration of intravenous contrast. Heavily T2-weighted images of the biliary and pancreatic ducts were obtained. Post-processing was applied at the acquisition scanner with  concurrent physician supervision which includes 3D reconstructions, MIPs, volume rendered images and/or shaded surface rendering. CONTRAST:  8.60mL GADAVIST GADOBUTROL 1 MMOL/ML IV SOLN COMPARISON:  MR abdomen dated 12/29/2022 FINDINGS: Decreased sensitivity and specificity for detailed findings due to motion artifact. Lower chest: Trace bilateral pleural  effusions. Hepatobiliary: Parenchymal signal abnormality. Scattered subcentimeter T2 hyperintense foci, likely cysts. Increased mild to moderate intrahepatic bile duct dilation more prominent in the left hepatic lobe with new mural thickening of the extrahepatic bile duct (4:16) and enhancement (1304:45). The common bile duct is nondilated. Gallbladder is decompressed with catheter in-situ and demonstrates circumferential mural thickening. Pancreas: No mass or inflammatory changes. 3 mm T2 hyperintense cystic focus arises directly from the main pancreatic duct in the pancreatic neck (6:20) keeping with side branch intraductal papillary mucinous neoplasms (IPMN). No main ductal dilation, mass lesion, or abnormal enhancement. Spleen:  Within normal limits in size and appearance. Adrenals/Urinary Tract: No adrenal nodules. No suspicious renal masses identified. No evidence of hydronephrosis. Stomach/Bowel: Small proximal duodenal diverticulum. Colonic diverticulosis without acute diverticulitis. Vascular/Lymphatic: No pathologically enlarged lymph nodes identified. No abdominal aortic aneurysm demonstrated. Other:  Trace ascites. Musculoskeletal: No suspicious bone lesions identified. Susceptibility artifact related to median sternotomy wires. IMPRESSION: 1. Increased mild to moderate intrahepatic bile duct dilation, more prominent in the left hepatic lobe, with new mural thickening and enhancement of the extrahepatic bile duct, suspicious for cholangitis. 2. Decompressed gallbladder with catheter in-situ and circumferential mural thickening. 3. Trace bilateral pleural effusions and trace ascites. Electronically Signed   By: Agustin Cree M.D.   On: 01/24/2023 15:54   MR 3D Recon At Scanner  Result Date: 01/24/2023 CLINICAL DATA:  Jaundice. History of acute cholecystitis status post percutaneous cholecystostomy tube placement 01/02/2023 EXAM: MRI ABDOMEN WITHOUT AND WITH CONTRAST (INCLUDING MRCP) TECHNIQUE:  Multiplanar multisequence MR imaging of the abdomen was performed both before and after the administration of intravenous contrast. Heavily T2-weighted images of the biliary and pancreatic ducts were obtained. Post-processing was applied at the acquisition scanner with concurrent physician supervision which includes 3D reconstructions, MIPs, volume rendered images and/or shaded surface rendering. CONTRAST:  8.74mL GADAVIST GADOBUTROL 1 MMOL/ML IV SOLN COMPARISON:  MR abdomen dated 12/29/2022 FINDINGS: Decreased sensitivity and specificity for detailed findings due to motion artifact. Lower chest: Trace bilateral pleural effusions. Hepatobiliary: Parenchymal signal abnormality. Scattered subcentimeter T2 hyperintense foci, likely cysts. Increased mild to moderate intrahepatic bile duct dilation more prominent in the left hepatic lobe with new mural thickening of the extrahepatic bile duct (4:16) and enhancement (1304:45). The common bile duct is nondilated. Gallbladder is decompressed with catheter in-situ and demonstrates circumferential mural thickening. Pancreas: No mass or inflammatory changes. 3 mm T2 hyperintense cystic focus arises directly from the main pancreatic duct in the pancreatic neck (6:20) keeping with side branch intraductal papillary mucinous neoplasms (IPMN). No main ductal dilation, mass lesion, or abnormal enhancement. Spleen:  Within normal limits in size and appearance. Adrenals/Urinary Tract: No adrenal nodules. No suspicious renal masses identified. No evidence of hydronephrosis. Stomach/Bowel: Small proximal duodenal diverticulum. Colonic diverticulosis without acute diverticulitis. Vascular/Lymphatic: No pathologically enlarged lymph nodes identified. No abdominal aortic aneurysm demonstrated. Other:  Trace ascites. Musculoskeletal: No suspicious bone lesions identified. Susceptibility artifact related to median sternotomy wires. IMPRESSION: 1. Increased mild to moderate intrahepatic bile  duct dilation, more prominent in the left hepatic lobe, with new mural thickening and enhancement of the extrahepatic bile duct, suspicious for cholangitis. 2. Decompressed gallbladder with catheter in-situ and circumferential mural thickening. 3. Trace bilateral pleural  effusions and trace ascites. Electronically Signed   By: Agustin Cree M.D.   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 Patient now with worsening hyperbilirubinemia and jaundice and as such presents for cholangiogram and potential cholecystostomy tube exchange. EXAM: FLUOROSCOPIC GUIDED CHOLECYSTOSTOMY TUBE EXCHANGE COMPARISON:  MRCP-12/29/2022 Image guided cholecystostomy tube placement-01/02/2023 MEDICATIONS: None ANESTHESIA/SEDATION: None CONTRAST:  15mL OMNIPAQUE IOHEXOL 300 MG/ML SOLN - administered into the gallbladder lumen. FLUOROSCOPY TIME:  1 minute, 6 seconds (6.1 mGy) COMPLICATIONS: None immediate. PROCEDURE: The patient was positioned supine on the fluoroscopy table. The external portion of the existing cholecystostomy tube as well as the surrounding skin was prepped and draped in usual sterile fashion. A time-out was performed prior to the initiation of the procedure. A preprocedural spot fluoroscopic image was obtained of the right upper abdominal quadrant existing cholecystostomy tube. The skin surrounding the cholecystostomy tube was anesthetized with 1% lidocaine with epinephrine. The external portion of the cholecystostomy tube was cut and cannulated with a short Amplatz wire which was advanced through the tube and coiled within the gallbladder lumen Next, under intermittent fluoroscopic guidance, the existing 10 [place] cholecystostomy tube was exchanged for a new, slightly larger now 10 [place] cholecystostomy tube which was repositioned into  the more central aspect of the gallbladder lumen. Contrast injection confirms appropriate positioning and functionality of the cholecystomy tube. The cholecystostomy tube was flushed with a small amount of saline and reconnected to a gravity bag. The cholecystostomy tube was secured with an interrupted suture and a Stat Lock device. A dressing was applied. The patient tolerated the procedure well without immediate postprocedural complication. FINDINGS: Preprocedural spot fluoroscopic image demonstrates unchanged positioning of cholecystostomy tube with end coiled and locked over the expected location of the fundus of the gallbladder. Contrast injection demonstrates appropriate positioning and functionality of the existing cholecystostomy tube. There is opacification of the peripheral aspect of the cystic duct without passage of contrast to the level of the CBD. After fluoroscopic guided exchange, the new, 10 [place] cholecystostomy tube is appropriately positioned within the gallbladder lumen. Post exchange cholangiogram demonstrates appropriate positioning and functionality of the new cholecystostomy tube. IMPRESSION: Successful fluoroscopic guided exchange of 10 [place] cholecystostomy tube. PLAN: - Cholangiogram demonstrates persistent occlusion of the cystic duct without opacification of the CBD. 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 The patient is visibly jaundiced with worsening hyperbilirubinemia (bilirubin - 3.9 on 11/11, 0.6 on the day of cholecystostomy tube placement) and as such the decision was made to escort the patient to the emergency department for further evaluation and management as the patient will likely require an ERCP to relieve his worsening biliary obstruction. Electronically Signed   By: Simonne Come M.D.   On: 01/24/2023 11:06       Assessment / Plan:    #42 77 year old white male presenting  initially 12/30/2022 with acute cholecystitis with concern for Mirizzi syndrome Underwent percutaneous cholecystostomy drainage 01/02/2023, discharged home on a short course of Augmentin  Patient came in for planned IR drain study 01/24/2023 which showed persistent occlusion of the cystic duct, tube was exchanged, patient noted to be quite jaundiced and was admitted.  Repeat MRI/MRCP shows a decompressed gallbladder, catheter in situ, persistent circumferential mural thickening of the gallbladder and increase in mild to moderate intrahepatic bile duct dilation more prominent  on the left.  There is also new mural thickening and enhancement of the extrahepatic bile duct suspicious for cholangitis, common bile duct is not dilated  Picture is consistent with persistent Mirizzi with secondary obstruction of the intrahepatic duct primarily on the left  #2 chronic anticoagulation-on Eliquis last dose 01/24/2023 #3 history of atrial fibs/flutter #4 history of seizure disorder #5.  Status post mitral valve annuloplasty #6 hyponatremia  Plan; Dr. Russella Dar covering for biliary this weekend, reviewed case and due to very complex situation did not feel that left intrahepatic would be able to be stented by him.  Advised that advanced biliary MD/Dr. Meridee Score would be best to review and advise(available Monday) Pt  has remained stable, LFTs stable-no evidence for cholangitis  Now on IV Unasyn  Discussed with Dr. Ivan Anchors plan is for Dr. Mitzi Davenport Allen/biliary to see patient tomorrow and assess for surgery  Situation was explained in detail to the patient and his wife, and they voiced understanding  Resume diet today follow daily LFTs and CBC Continue to hold Eliquis    Principal Problem:   Obstructive jaundice Active Problems:   Hypertension   Atrial fibrillation, chronic (HCC)   Seizure (HCC)   Cholecystitis     LOS: 0 days   Amy Esterwood PA-C 01/26/2023, 8:53 AM

## 2023-01-27 DIAGNOSIS — K851 Biliary acute pancreatitis without necrosis or infection: Secondary | ICD-10-CM | POA: Diagnosis not present

## 2023-01-27 DIAGNOSIS — K831 Obstruction of bile duct: Secondary | ICD-10-CM | POA: Diagnosis not present

## 2023-01-27 DIAGNOSIS — K819 Cholecystitis, unspecified: Secondary | ICD-10-CM | POA: Diagnosis not present

## 2023-01-27 LAB — COMPREHENSIVE METABOLIC PANEL
ALT: 124 U/L — ABNORMAL HIGH (ref 0–44)
AST: 128 U/L — ABNORMAL HIGH (ref 15–41)
Albumin: 1.9 g/dL — ABNORMAL LOW (ref 3.5–5.0)
Alkaline Phosphatase: 453 U/L — ABNORMAL HIGH (ref 38–126)
Anion gap: 8 (ref 5–15)
BUN: 11 mg/dL (ref 8–23)
CO2: 20 mmol/L — ABNORMAL LOW (ref 22–32)
Calcium: 7.8 mg/dL — ABNORMAL LOW (ref 8.9–10.3)
Chloride: 95 mmol/L — ABNORMAL LOW (ref 98–111)
Creatinine, Ser: 0.74 mg/dL (ref 0.61–1.24)
GFR, Estimated: >60 mL/min (ref >60)
Glucose, Bld: 94 mg/dL (ref 70–99)
Potassium: 4 mmol/L (ref 3.5–5.1)
Sodium: 123 mmol/L — ABNORMAL LOW (ref 135–145)
Total Bilirubin: 21 mg/dL (ref <1.2)
Total Protein: 5.1 g/dL — ABNORMAL LOW (ref 6.5–8.1)

## 2023-01-27 LAB — CBC WITH DIFFERENTIAL/PLATELET
Abs Immature Granulocytes: 0.05 10*3/uL (ref 0.00–0.07)
Basophils Absolute: 0 10*3/uL (ref 0.0–0.1)
Basophils Relative: 1 %
Eosinophils Absolute: 0.3 10*3/uL (ref 0.0–0.5)
Eosinophils Relative: 8 %
HCT: 27.4 % — ABNORMAL LOW (ref 39.0–52.0)
Hemoglobin: 9.6 g/dL — ABNORMAL LOW (ref 13.0–17.0)
Immature Granulocytes: 1 %
Lymphocytes Relative: 20 %
Lymphs Abs: 0.8 10*3/uL (ref 0.7–4.0)
MCH: 33.9 pg (ref 26.0–34.0)
MCHC: 35 g/dL (ref 30.0–36.0)
MCV: 96.8 fL (ref 80.0–100.0)
Monocytes Absolute: 0.8 10*3/uL (ref 0.1–1.0)
Monocytes Relative: 19 %
Neutro Abs: 1.9 10*3/uL (ref 1.7–7.7)
Neutrophils Relative %: 51 %
Platelets: 163 10*3/uL (ref 150–400)
RBC: 2.83 MIL/uL — ABNORMAL LOW (ref 4.22–5.81)
RDW: 20.5 % — ABNORMAL HIGH (ref 11.5–15.5)
WBC: 3.9 10*3/uL — ABNORMAL LOW (ref 4.0–10.5)
nRBC: 0 % (ref 0.0–0.2)

## 2023-01-27 NOTE — Progress Notes (Signed)
PROGRESS NOTE    Samuel Mayo  ZOX:[id number] DOB: 01/17/1946 DOA: 01/24/2023 PCP: [name], [name], MD    Brief Narrative:   Samuel Mayo is a 77 y.o. male with past medical history significant for  hypertension, atrial fibrillation, seizures, gout, MVR status post annuloplasty presenting with worsening jaundice.   Patient was admitted from 10/28 until 11/1.  Had cholecystitis but underwent cholecystostomy tube in setting of cholecystectomy.  Diagnosed with likely Mirizzi syndrome as there is no obstructing stone noted.  Follow-up outpatient and was noted to have worsening LFTs.  Was reevaluated by IR and was seen day of admission for reevaluation of gallbladder and underwent cholecystostomy tube exchange. During imaging today patient noted to have persistent occlusion of the cystic duct with out opacification consistent with 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.  Given worsening laboratory abnormalities patient recommended to proceed to the ED for further evaluation.   Patient reports some lethargy and decreased appetite but denies fever, chills, chest pain, shortness of breath, abdominal pain, constipation, diarrhea, nausea, vomiting.   In the ED, temperature 98.1 F, HR 92, RR 16, BP 118/83, SpO2 98% on room air.  WBC 5.0, hemoglobin 10.3, platelet count 189.  Sodium 127, potassium 3.8, chloride 93, CO2 23, glucose 133, BUN 18, creatinine 0.81.  Lipase 1228.  AST 144, ALT 136, total bilirubin 20.9.  Urinalysis with moderate bilirubin, otherwise unrevealing.  INR 1.2.  Patient received IV fluid bolus in the ED.  General surgery consulted with recommendation of MRCP.  TRH consulted for admission for further evaluation management of obstructive jaundice.   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   Underwent exchange of cholecystostomy tube.  During procedure noted persistent occlusion of the cystic duct without opacification consistent with Mirizzi syndrome not responding appropriately to cholecystostomy.  MRCP with increased mild/moderate intrahepatic bile duct dilation, more prominent left hepatic lobe with new mural thickening, enhancement of the head intrahepatic bile duct suspicious for cholangitis, decompressed gallbladder with catheter in place and circumferential mural thickening. -- General Surgery and [facility] following, appreciate assistance -- AST 144>134>142>128 -- ALT 136>117>131>124 -- Tbili 20.9>19.4>20.8>21.0 -- Flush drain every 12 hours, monitor drain output every shift -- Unasyn 1.5 g IV every 8 hours -- CMP daily -- Further per GI/general surgery; per general/biliary surgery do not think this process is due to gallbladder or Mirizzi syndrome with recommendation of CBD evaluation via ERCP or PTC  Hyponatremia Baseline sodium over the past 1 month 128-132.  Sodium 127 on admission, close to baseline. -- Monitor CMP daily  Essential hypertension -- Amlodipine 5 mg p.o. daily -- Metoprolol succinate 25 mg p.o. daily  Paroxysmal atrial fibrillation -- Metoprolol succinate 25 mg p.o. daily -- Holding Eliquis for need of likely ERCP  Seizure disorder -- Depakote 750 mg PO BID  History of MVR s/p annuloplasty Continue outpatient follow-up with cardiology   DVT prophylaxis: SCDs Start: 01/24/23 1337    Code Status: Full Code Family Communication:   Disposition Plan:  Level of care: Med-Surg Status is: Inpatient Remains inpatient appropriate because: Anticipate need of ERCP versus PTC, further per specialists      Consultants:  General surgery [facility]  Procedures:  Cholecystostomy exchange by IR on 11/22  Antimicrobials:  Unasyn 11/23>>   Subjective: Patient seen examined bedside, resting comfortably.  Sitting in bedside  chair, spouse present.  Seen  by general surgery this morning with recommendation of CBD evaluation with ERCP versus PTC.  Discussed with general surgery PA this morning.  Awaiting GI evaluation today.  Patient and spouse patiently awaiting next steps.  Patient denies headache, no dizziness, no chest pain, no shortness of breath, no abdominal pain, no fever/chills/night sweats, no nausea/vomiting/diarrhea, no focal weakness, no fatigue, no paresthesia.  No acute events overnight per nursing.  Objective: Vitals:   01/26/23 2000 01/27/23 0408 01/27/23 0756 01/27/23 1007  BP: 112/77 114/76 114/82 114/82  Pulse: 79 75 76 76  Resp: 18 (!) 22 18   Temp: 98 F (36.7 C) (!) 97.5 F (36.4 C) 97.8 F (36.6 C)   TempSrc: Oral Oral Oral   SpO2: 97% 95% 100%   Weight:      Height:        Intake/Output Summary (Last 24 hours) at 01/27/2023 1236 Last data filed at 01/27/2023 1007 Gross per 24 hour  Intake 3 ml  Output 50 ml  Net -47 ml   Filed Weights   01/24/23 0953  Weight: 86.2 kg    Examination:  Physical Exam: GEN: NAD, alert and oriented x 3, wd/wn, + jaundice HEENT: NCAT, PERRL, EOMI, + scleral icterus, MMM PULM: CTAB w/o wheezes/crackles, normal respiratory effort, on room air CV: RRR w/o M/G/R GI: abd soft, NTND, NABS, no R/G/M, noted cholecystostomy drain right side with bilious fluid in collection bag MSK: no peripheral edema, muscle strength globally intact 5/5 bilateral upper/lower extremities NEURO: CN II-XII intact, no focal deficits, sensation to light touch intact PSYCH: normal mood/affect Integumentary: Diffuse jaundice skin, otherwise no other concerning rashes/lesions/wounds noted on exposed skin surfaces dry/intact, no rashes or wounds    Data Reviewed: I have personally reviewed following labs and imaging studies  CBC: Recent Labs  Lab 01/24/23 1020 01/25/23 0548 01/27/23 0700  WBC 5.0 4.6 3.9*  NEUTROABS 3.1  --  1.9  HGB 10.3* 8.9* 9.6*  HCT 29.8* 25.5*  27.4*  MCV 96.1 95.5 96.8  PLT 189 180 163   Basic Metabolic Panel: Recent Labs  Lab 01/24/23 1020 01/25/23 0548 01/26/23 0627 01/27/23 0700  NA 127* 127* 125* 123*  K 3.8 4.0 3.3* 4.0  CL 93* 96* 94* 95*  CO2 23 22 22  20*  GLUCOSE 133* 83 101* 94  BUN 18 11 8 11   CREATININE 0.81 0.80 0.83 0.74  CALCIUM 8.6* 7.9* 7.8* 7.8*   GFR: Estimated Creatinine Clearance: 81.1 mL/min (by C-G formula based on SCr of 0.74 mg/dL). Liver Function Tests: Recent Labs  Lab 01/24/23 1020 01/25/23 0548 01/26/23 0627 01/27/23 0700  AST 144* 134* 142* 128*  ALT 136* 117* 131* 124*  ALKPHOS 436* 428* 470* 453*  BILITOT 20.9* 19.4* 20.8* 21.0*  PROT 6.0* 4.9* 5.1* 5.1*  ALBUMIN 2.5* 2.0* 2.0* 1.9*   Recent Labs  Lab 01/24/23 1020 01/26/23 0627  LIPASE 1,228* 737*   No results for input(s): "AMMONIA" in the last 168 hours. Coagulation Profile: Recent Labs  Lab 01/25/23 0548  INR 1.2   Cardiac Enzymes: No results for input(s): "CKTOTAL", "CKMB", "CKMBINDEX", "TROPONINI" in the last 168 hours. BNP (last 3 results) No results for input(s): "PROBNP" in the last 8760 hours. HbA1C: No results for input(s): "HGBA1C" in the last 72 hours. CBG: No results for input(s): "GLUCAP" in the last 168 hours. Lipid Profile: No results for input(s): "CHOL", "HDL", "LDLCALC", "TRIG", "CHOLHDL", "LDLDIRECT" in the last 72 hours. Thyroid Function Tests: No results for input(s): "TSH", "T4TOTAL", "FREET4", "T3FREE", "  THYROIDAB" in the last 72 hours. Anemia Panel: No results for input(s): "VITAMINB12", "FOLATE", "FERRITIN", "TIBC", "IRON", "RETICCTPCT" in the last 72 hours. Sepsis Labs: No results for input(s): "PROCALCITON", "LATICACIDVEN" in the last 168 hours.  No results found for this or any previous visit (from the past 240 hour(s)).       Radiology Studies: No results found.      Scheduled Meds:  amLODipine  5 mg Oral Daily   divalproex  750 mg Oral BID   gabapentin  300 mg  Oral QHS   metoprolol succinate  25 mg Oral Daily   sodium chloride flush  3 mL Intravenous Q12H   Continuous Infusions:  ampicillin-sulbactam (UNASYN) IV 1.5 g (01/27/23 1014)     LOS: 1 day    Time spent: 54 minutes spent on chart review, discussion with nursing staff, consultants, updating family and interview/physical exam; more than 50% of that time was spent in counseling and/or coordination of care.    Alvira Philips [place], DO [facility] Available via Epic secure chat 7am-7pm After these hours, please refer to coverage provider listed on [email or web address] 01/27/2023, 12:36 PM

## 2023-01-27 NOTE — Progress Notes (Signed)
Progress Note     Subjective: Sitting up in chair and just finished breakfast. Low appetite but tolerating solid diet without n/v/abdominal pain. No complaints  Significant other at bedside  Objective: Vital signs in last 24 hours: Temp:  [97.5 F (36.4 C)-98.2 F (36.8 C)] 97.8 F (36.6 C) (11/25 0756) Pulse Rate:  [75-85] 76 (11/25 0756) Resp:  [15-22] 18 (11/25 0756) BP: (112-118)/(76-83) 114/82 (11/25 0756) SpO2:  [95 %-100 %] 100 % (11/25 0756) Last BM Date : 01/24/23  Intake/Output from previous day: 11/24 0701 - 11/25 0700 In: -  Out: 50 [Drains:50] Intake/Output this shift: No intake/output data recorded.  PE: General: pleasant, WD, male who is laying in bed in NAD Lungs: Respiratory effort nonlabored Abd: soft, NT, ND MSK: all 4 extremities are symmetrical with no cyanosis, clubbing, or edema. Skin: warm and dry  Psych: A&Ox3 with an appropriate affect.    Lab Results:  Recent Labs    01/25/23 0548 01/27/23 0700  WBC 4.6 3.9*  HGB 8.9* 9.6*  HCT 25.5* 27.4*  PLT 180 163   BMET Recent Labs    01/26/23 0627 01/27/23 0700  NA 125* 123*  K 3.3* 4.0  CL 94* 95*  CO2 22 20*  GLUCOSE 101* 94  BUN 8 11  CREATININE 0.83 0.74  CALCIUM 7.8* 7.8*   PT/INR Recent Labs    01/25/23 0548  LABPROT 15.0  INR 1.2   CMP     Component Value Date/Time   NA 123 (L) 01/27/2023 0700   NA 141 05/20/2022 1026   NA 136 02/03/2012 1915   K 4.0 01/27/2023 0700   K 3.8 02/03/2012 1915   CL 95 (L) 01/27/2023 0700   CL 102 02/03/2012 1915   CO2 20 (L) 01/27/2023 0700   CO2 27 02/03/2012 1915   GLUCOSE 94 01/27/2023 0700   GLUCOSE 87 02/03/2012 1915   BUN 11 01/27/2023 0700   BUN 24 05/20/2022 1026   BUN 18 02/03/2012 1915   CREATININE 0.74 01/27/2023 0700   CREATININE 0.90 02/03/2012 1915   CALCIUM 7.8 (L) 01/27/2023 0700   CALCIUM 8.2 (L) 02/03/2012 1915   PROT 5.1 (L) 01/27/2023 0700   PROT 5.9 (L) 05/20/2022 1026   PROT 6.7 02/03/2012 1915    ALBUMIN 1.9 (L) 01/27/2023 0700   ALBUMIN 3.9 05/20/2022 1026   ALBUMIN 3.9 02/03/2012 1915   AST 128 (H) 01/27/2023 0700   AST 31 02/03/2012 1915   ALT 124 (H) 01/27/2023 0700   ALT 31 02/03/2012 1915   ALKPHOS 453 (H) 01/27/2023 0700   ALKPHOS 75 02/03/2012 1915   BILITOT 21.0 (HH) 01/27/2023 0700   BILITOT 0.4 05/20/2022 1026   BILITOT 0.5 02/03/2012 1915   GFRNONAA >60 01/27/2023 0700   GFRNONAA >60 02/03/2012 1915   GFRAA >60 10/07/2019 1330   GFRAA >60 02/03/2012 1915   Lipase     Component Value Date/Time   LIPASE 737 (H) 01/26/2023 0627       Studies/Results: No results found.  Anti-infectives: Anti-infectives (From admission, onward)    Start     Dose/Rate Route Frequency Ordered Stop   01/25/23 1600  ampicillin-sulbactam (UNASYN) 1.5 g in sodium chloride 0.9 % 100 mL IVPB        1.5 g 200 mL/hr over 30 Minutes Intravenous Every 6 hours 01/25/23 1525          Assessment/Plan  Jaundice  - admission 10/28-11/1 with acute cholecystitis and adjacent abscess, possible Mirizzi syndrome  -  IR drain placement 10/31 - drain study/exchange 11/22 by IR with persistent occlusion of cystic duct and no opacification of CBD - Tbili >20 this AM - MRCP reviewed - ductal dilatation, "thickening" of extrahepatic ductal wall - appreciate GI consultation - Dr. Barron Alvine and Dr. Russella Dar - discussed with Dr. Freida Busman (hepatobiliary surgery) and recommend ERCP if possible for stent/cytology (per GI, Dr. Meridee Score reviewing today) otherwise recommend IR PTC for further evaluation   FEN: heart ID: [email or web address] VTE: SCDs   hx of MVR w/ annuloplasty Afib on Eliquis LD 11/22 HTN Seizure disorder  Hyponatemia    I reviewed Consultant GI notes, hospitalist notes, last 24 h vitals and pain scores, last 48 h intake and output, last 24 h labs and trends, and last 24 h imaging results.     LOS: 1 day   Eric Form, [facility] 01/27/2023, 8:36 AM Please see  Amion for pager number during day hours 7:00am-4:30pm

## 2023-01-27 NOTE — Progress Notes (Signed)
Daily Progress Note  DOA: 01/24/2023 Hospital Day: 4  Chief Complaint:  jaundice  ASSESSMENT    Brief Narrative:  Samuel Mayo is a 77 y.o. year old male with a history of  Afib / flutter on Eliquis, mitral valve annuloplasty, HTN, seizure disorder, . 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 Underwent percutaneous cholecystomy tube placed. Discharged 01/03/23 on antibiotics. Saw PCP, found to have worsening jaundice. Went for drain study 11/22 and there was non-visualization of CBD so readmitted on 11/22. GI saw on 11/23 for jaundice. MRI/MRCP showed increased mild to moderate intrahepatic bile duct dilation and new mural thickening and enhancement of the extrahepatic bile duct suspicious for cholangitis.   Acute cholecystitis / Mirizzi syndrome / suspected cholangitis Has cholecystostomy tube in place.  Today:  Alk phos 470>. 453 Tbili 20.8 >> 21  Hyponatremia Na + worse today at 123  PAF.  Eliquis on hold  Seizure disorder On Depakote  Principal Problem:   Obstructive jaundice Active Problems:   Hypertension   Atrial fibrillation, chronic (HCC)   Seizure (HCC)   Cholecystitis   PLAN   --Case previously discussed with Surgery who felt cholecystectomy is not best route right now and recommended ERCP with stenting of intrahepatic duct. Dr. Leone Payor is willing to offer ERCP but patient would first like to speak with IR about any other options such as PTC to potentially put in an intrahepatic biliary stent. IR paged to discuss --On Unasyn  Subjective   No complaints. Feels okay. No abdominal pain    Objective   Drain output 50 ml yesterday  Recent Labs    01/25/23 0548 01/27/23 0700  WBC 4.6 3.9*  HGB 8.9* 9.6*  HCT 25.5* 27.4*  PLT 180 163   BMET Recent Labs    01/25/23 0548 01/26/23 0627 01/27/23 0700  NA 127* 125* 123*  K 4.0 3.3* 4.0  CL 96* 94* 95*  CO2 22 22 20*  GLUCOSE 83 101* 94   BUN 11 8 11   CREATININE 0.80 0.83 0.74  CALCIUM 7.9* 7.8* 7.8*   LFT Recent Labs    01/27/23 0700  PROT 5.1*  ALBUMIN 1.9*  AST 128*  ALT 124*  ALKPHOS 453*  BILITOT 21.0*   PT/INR Recent Labs    01/25/23 0548  LABPROT 15.0  INR 1.2     Imaging:  MR 3D Recon At Scanner CLINICAL DATA:  Jaundice. History of acute cholecystitis status post percutaneous cholecystostomy tube placement 01/02/2023  EXAM: MRI ABDOMEN WITHOUT AND WITH CONTRAST (INCLUDING MRCP)  TECHNIQUE: Multiplanar multisequence MR imaging of the abdomen was performed both before and after the administration of intravenous contrast. Heavily T2-weighted images of the biliary and pancreatic ducts were obtained.  Post-processing was applied at the acquisition scanner with concurrent physician supervision which includes 3D reconstructions, MIPs, volume rendered images and/or shaded surface rendering.  CONTRAST:  8.50mL GADAVIST GADOBUTROL 1 MMOL/ML IV SOLN  COMPARISON:  MR abdomen dated 12/29/2022  FINDINGS: Decreased sensitivity and specificity for detailed findings due to motion artifact.  Lower chest: Trace bilateral pleural effusions.  Hepatobiliary: Parenchymal signal abnormality. Scattered subcentimeter T2 hyperintense foci, likely cysts. Increased mild to moderate intrahepatic bile duct dilation more prominent in the left hepatic lobe with new mural thickening of the extrahepatic bile duct (4:16) and enhancement (1304:45). The common bile duct is nondilated. Gallbladder is decompressed with catheter in-situ and demonstrates circumferential mural thickening.  Pancreas: No mass or inflammatory changes. 3 mm  T2 hyperintense cystic focus arises directly from the main pancreatic duct in the pancreatic neck (6:20) keeping with side branch intraductal papillary mucinous neoplasms (IPMN). No main ductal dilation, mass lesion, or abnormal enhancement.  Spleen:  Within normal limits in size and  appearance.  Adrenals/Urinary Tract: No adrenal nodules. No suspicious renal masses identified. No evidence of hydronephrosis.  Stomach/Bowel: Small proximal duodenal diverticulum. Colonic diverticulosis without acute diverticulitis.  Vascular/Lymphatic: No pathologically enlarged lymph nodes identified. No abdominal aortic aneurysm demonstrated.  Other:  Trace ascites.  Musculoskeletal: No suspicious bone lesions identified. Susceptibility artifact related to median sternotomy wires.  IMPRESSION: 1. Increased mild to moderate intrahepatic bile duct dilation, more prominent in the left hepatic lobe, with new mural thickening and enhancement of the extrahepatic bile duct, suspicious for cholangitis. 2. Decompressed gallbladder with catheter in-situ and circumferential mural thickening. 3. Trace bilateral pleural effusions and trace ascites.  Electronically Signed   By: Agustin Cree M.D.   On: 01/24/2023 15:54 MR ABDOMEN MRCP W WO CONTAST CLINICAL DATA:  Jaundice. History of acute cholecystitis status post percutaneous cholecystostomy tube placement 01/02/2023  EXAM: MRI ABDOMEN WITHOUT AND WITH CONTRAST (INCLUDING MRCP)  TECHNIQUE: Multiplanar multisequence MR imaging of the abdomen was performed both before and after the administration of intravenous contrast. Heavily T2-weighted images of the biliary and pancreatic ducts were obtained.  Post-processing was applied at the acquisition scanner with concurrent physician supervision which includes 3D reconstructions, MIPs, volume rendered images and/or shaded surface rendering.  CONTRAST:  8.57mL GADAVIST GADOBUTROL 1 MMOL/ML IV SOLN  COMPARISON:  MR abdomen dated 12/29/2022  FINDINGS: Decreased sensitivity and specificity for detailed findings due to motion artifact.  Lower chest: Trace bilateral pleural effusions.  Hepatobiliary: Parenchymal signal abnormality. Scattered subcentimeter T2 hyperintense foci, likely  cysts. Increased mild to moderate intrahepatic bile duct dilation more prominent in the left hepatic lobe with new mural thickening of the extrahepatic bile duct (4:16) and enhancement (1304:45). The common bile duct is nondilated. Gallbladder is decompressed with catheter in-situ and demonstrates circumferential mural thickening.  Pancreas: No mass or inflammatory changes. 3 mm T2 hyperintense cystic focus arises directly from the main pancreatic duct in the pancreatic neck (6:20) keeping with side branch intraductal papillary mucinous neoplasms (IPMN). No main ductal dilation, mass lesion, or abnormal enhancement.  Spleen:  Within normal limits in size and appearance.  Adrenals/Urinary Tract: No adrenal nodules. No suspicious renal masses identified. No evidence of hydronephrosis.  Stomach/Bowel: Small proximal duodenal diverticulum. Colonic diverticulosis without acute diverticulitis.  Vascular/Lymphatic: No pathologically enlarged lymph nodes identified. No abdominal aortic aneurysm demonstrated.  Other:  Trace ascites.  Musculoskeletal: No suspicious bone lesions identified. Susceptibility artifact related to median sternotomy wires.  IMPRESSION: 1. Increased mild to moderate intrahepatic bile duct dilation, more prominent in the left hepatic lobe, with new mural thickening and enhancement of the extrahepatic bile duct, suspicious for cholangitis. 2. Decompressed gallbladder with catheter in-situ and circumferential mural thickening. 3. Trace bilateral pleural effusions and trace ascites.  Electronically Signed   By: Agustin Cree M.D.   On: 01/24/2023 15:54 IR EXCHANGE BILIARY DRAIN INDICATION: History of acute cholecystitis and 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, post ultrasound fluoroscopic guided cholecystostomy tube placement on 01/02/2023.  Patient now with worsening hyperbilirubinemia and jaundice and as such presents for cholangiogram and potential cholecystostomy  tube exchange.  EXAM: FLUOROSCOPIC GUIDED CHOLECYSTOSTOMY TUBE EXCHANGE  COMPARISON:  MRCP-12/29/2022  Image guided cholecystostomy tube placement-01/02/2023  MEDICATIONS: None  ANESTHESIA/SEDATION: None  CONTRAST:  15mL OMNIPAQUE IOHEXOL 300 MG/ML SOLN -  administered into the gallbladder lumen.  FLUOROSCOPY TIME:  1 minute, 6 seconds (6.1 mGy)  COMPLICATIONS: None immediate.  PROCEDURE: The patient was positioned supine on the fluoroscopy table. The external portion of the existing cholecystostomy tube as well as the surrounding skin was prepped and draped in usual sterile fashion. A time-out was performed prior to the initiation of the procedure.  A preprocedural spot fluoroscopic image was obtained of the right upper abdominal quadrant existing cholecystostomy tube.  The skin surrounding the cholecystostomy tube was anesthetized with 1% lidocaine with epinephrine.  The external portion of the cholecystostomy tube was cut and cannulated with a short Amplatz wire which was advanced through the tube and coiled within the gallbladder lumen  Next, under intermittent fluoroscopic guidance, the existing 10 [place] cholecystostomy tube was exchanged for a new, slightly larger now 10 [place] cholecystostomy tube which was repositioned into the more central aspect of the gallbladder lumen.  Contrast injection confirms appropriate positioning and functionality of the cholecystomy tube.  The cholecystostomy tube was flushed with a small amount of saline and reconnected to a gravity bag. The cholecystostomy tube was secured with an interrupted suture and a Stat Lock device. A dressing was applied.  The patient tolerated the procedure well without immediate postprocedural complication.  FINDINGS: Preprocedural spot fluoroscopic image demonstrates unchanged positioning of cholecystostomy tube with end coiled and locked over the expected location of the fundus of the  gallbladder.  Contrast injection demonstrates appropriate positioning and functionality of the existing cholecystostomy tube. There is opacification of the peripheral aspect of the cystic duct without passage of contrast to the level of the CBD.  After fluoroscopic guided exchange, the new, 10 [place] cholecystostomy tube is appropriately positioned within the gallbladder lumen.  Post exchange cholangiogram demonstrates appropriate positioning and functionality of the new cholecystostomy tube.  IMPRESSION: Successful fluoroscopic guided exchange of 10 [place] cholecystostomy tube.  PLAN: - Cholangiogram demonstrates persistent occlusion of the cystic duct without opacification of the CBD.  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 The patient is visibly jaundiced with worsening hyperbilirubinemia (bilirubin - 3.9 on 11/11, 0.6 on the day of cholecystostomy tube placement) and as such the decision was made to escort the patient to the emergency department for further evaluation and management as the patient will likely require an ERCP to relieve his worsening biliary obstruction.  Electronically Signed   By: Simonne Come M.D.   On: 01/24/2023 11:06     Scheduled inpatient medications:   amLODipine  5 mg Oral Daily   divalproex  750 mg Oral BID   gabapentin  300 mg Oral QHS   metoprolol succinate  25 mg Oral Daily   sodium chloride flush  3 mL Intravenous Q12H   Continuous inpatient infusions:   ampicillin-sulbactam (UNASYN) IV 1.5 g (01/27/23 1014)   PRN inpatient medications: acetaminophen **OR** acetaminophen, diclofenac Sodium, polyethylene glycol  Vital signs in last 24 hours: Temp:  [97.5 F (36.4 C)-98.2 F (36.8 C)] 97.8 F (36.6 C) (11/25 0756) Pulse Rate:  [75-85] 76 (11/25 1007) Resp:  [15-22] 18 (11/25 0756) BP: (112-118)/(76-83) 114/82 (11/25 1007) SpO2:  [95 %-100  %] 100 % (11/25 0756) Last BM Date : 01/24/23  Intake/Output Summary (Last 24 hours) at 01/27/2023 1456 Last data filed at 01/27/2023 1453 Gross per 24 hour  Intake 483 ml  Output 50 ml  Net 433 ml    Intake/Output from previous day: 11/24 0701 - 11/25  0700 In: -  Out: 50 [Drains:50] Intake/Output this shift: Total I/O In: 483 [P.O.:480; I.V.:3] Out: -    Physical Exam:  General: Alert male in NAD. Deeply jaundice Heart:  Regular rate and rhythm.  Pulmonary: Normal respiratory effort Abdomen: Soft, nondistended, nontender. Normal bowel sounds. Extremities: No lower extremity edema  Neurologic: Alert and oriented Psych: Pleasant. Cooperative. Insight appears normal.      LOS: 1 day   Willette Cluster ,NP 01/27/2023, 2:56 PM

## 2023-01-27 NOTE — Plan of Care (Signed)

## 2023-01-28 DIAGNOSIS — K810 Acute cholecystitis: Secondary | ICD-10-CM | POA: Diagnosis not present

## 2023-01-28 DIAGNOSIS — K831 Obstruction of bile duct: Secondary | ICD-10-CM | POA: Diagnosis not present

## 2023-01-28 LAB — COMPREHENSIVE METABOLIC PANEL
ALT: 120 U/L — ABNORMAL HIGH (ref 0–44)
AST: 121 U/L — ABNORMAL HIGH (ref 15–41)
Albumin: 1.9 g/dL — ABNORMAL LOW (ref 3.5–5.0)
Alkaline Phosphatase: 423 U/L — ABNORMAL HIGH (ref 38–126)
Anion gap: 9 (ref 5–15)
BUN: 11 mg/dL (ref 8–23)
CO2: 19 mmol/L — ABNORMAL LOW (ref 22–32)
Calcium: 8 mg/dL — ABNORMAL LOW (ref 8.9–10.3)
Chloride: 96 mmol/L — ABNORMAL LOW (ref 98–111)
Creatinine, Ser: 0.76 mg/dL (ref 0.61–1.24)
GFR, Estimated: >60 mL/min (ref >60)
Glucose, Bld: 99 mg/dL (ref 70–99)
Potassium: 3.8 mmol/L (ref 3.5–5.1)
Sodium: 124 mmol/L — ABNORMAL LOW (ref 135–145)
Total Bilirubin: 18.8 mg/dL (ref <1.2)
Total Protein: 5 g/dL — ABNORMAL LOW (ref 6.5–8.1)

## 2023-01-28 MED ORDER — INDOMETHACIN 50 MG RE SUPP
100.0000 mg | Freq: Once | RECTAL | Status: DC
  Filled 2023-01-28: qty 2

## 2023-01-28 NOTE — Progress Notes (Signed)
Daily Progress Note  DOA: 01/24/2023 Hospital Day: 5  Chief Complaint: jaundice  ASSESSMENT    Brief Narrative:  Samuel Mayo is a 77 y.o. year old male with a history of  Afib / flutter on Eliquis, mitral valve annuloplasty, HTN, seizure disorder. 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 Underwent percutaneous cholecystomy tube placed. Discharged 01/03/23 on antibiotics. Saw PCP, found to have worsening jaundice. Went for drain study 11/22 and there was non-visualization of CBD so readmitted on 11/22. GI saw on 11/23 for jaundice. 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 Drain study on 11/22 by IR showed persistent occlusion of cystic duct and no opacification of CBD. MRCP showed dilated intrahepatic bile duct with new mural thickening of the extrahepatic bile duct, suspicious for cholangitis  Today:  Alk phos  423 << 453.  Tbili  18.8  << 21    Hyponatremia Na + 124   PAF.  Eliquis on hold   Seizure disorder On Depakote  Principal Problem:   Obstructive jaundice Active Problems:   Hypertension   Atrial fibrillation, chronic (HCC)   Seizure (HCC)   Cholecystitis   Acute biliary pancreatitis   PLAN   --Ongoing discussions with Surgery and IR. IR feels PTC would be high risk in absence of bile duct dilation making it more difficult to access the bile duct. ERCP with stent placement will also be challenging but will proceed with it tomorrow.If unsuccessful then will need PTC.    Subjective   Eating breakfast. No abdominal pain. No remaining questions about the ERCP   Objective    Recent Labs    01/27/23 0700  WBC 3.9*  HGB 9.6*  HCT 27.4*  PLT 163   BMET Recent Labs     01/26/23 0627 01/27/23 0700 01/28/23 0624  NA 125* 123* 124*  K 3.3* 4.0 3.8  CL 94* 95* 96*  CO2 22 20* 19*  GLUCOSE 101* 94 99  BUN 8 11 11   CREATININE 0.83 0.74 0.76  CALCIUM 7.8* 7.8* 8.0*   LFT Recent Labs    01/28/23 0624  PROT 5.0*  ALBUMIN 1.9*  AST 121*  ALT 120*  ALKPHOS 423*  BILITOT 18.8*   PT/INR No results for input(s): "LABPROT", "INR" in the last 72 hours.   Imaging:  MR 3D Recon At Scanner CLINICAL DATA:  Jaundice. History of acute cholecystitis status post percutaneous cholecystostomy tube placement 01/02/2023  EXAM: MRI ABDOMEN WITHOUT AND WITH CONTRAST (INCLUDING MRCP)  TECHNIQUE: Multiplanar multisequence MR imaging of the abdomen was performed both before and after the administration of intravenous contrast. Heavily T2-weighted images of the biliary and pancreatic ducts were obtained.  Post-processing was applied at the acquisition scanner with concurrent physician supervision which includes 3D reconstructions, MIPs, volume rendered images and/or shaded surface rendering.  CONTRAST:  8.76mL GADAVIST GADOBUTROL 1 MMOL/ML IV SOLN  COMPARISON:  MR abdomen dated 12/29/2022  FINDINGS: Decreased sensitivity and specificity for detailed findings due to motion artifact.  Lower chest: Trace bilateral pleural effusions.  Hepatobiliary: Parenchymal signal abnormality. Scattered subcentimeter T2 hyperintense foci, likely cysts. Increased mild to moderate intrahepatic bile duct dilation more prominent in the left hepatic lobe with new mural thickening of the extrahepatic bile duct (4:16) and enhancement (1304:45). The common bile duct is nondilated.  Gallbladder is decompressed with catheter in-situ and demonstrates circumferential mural thickening.  Pancreas: No mass or inflammatory changes. 3 mm T2 hyperintense cystic focus arises directly from the main pancreatic duct in the pancreatic neck (6:20) keeping with side branch  intraductal papillary mucinous neoplasms (IPMN). No main ductal dilation, mass lesion, or abnormal enhancement.  Spleen:  Within normal limits in size and appearance.  Adrenals/Urinary Tract: No adrenal nodules. No suspicious renal masses identified. No evidence of hydronephrosis.  Stomach/Bowel: Small proximal duodenal diverticulum. Colonic diverticulosis without acute diverticulitis.  Vascular/Lymphatic: No pathologically enlarged lymph nodes identified. No abdominal aortic aneurysm demonstrated.  Other:  Trace ascites.  Musculoskeletal: No suspicious bone lesions identified. Susceptibility artifact related to median sternotomy wires.  IMPRESSION: 1. Increased mild to moderate intrahepatic bile duct dilation, more prominent in the left hepatic lobe, with new mural thickening and enhancement of the extrahepatic bile duct, suspicious for cholangitis. 2. Decompressed gallbladder with catheter in-situ and circumferential mural thickening. 3. Trace bilateral pleural effusions and trace ascites.  Electronically Signed   By: Agustin Cree M.D.   On: 01/24/2023 15:54 MR ABDOMEN MRCP W WO CONTAST CLINICAL DATA:  Jaundice. History of acute cholecystitis status post percutaneous cholecystostomy tube placement 01/02/2023  EXAM: MRI ABDOMEN WITHOUT AND WITH CONTRAST (INCLUDING MRCP)  TECHNIQUE: Multiplanar multisequence MR imaging of the abdomen was performed both before and after the administration of intravenous contrast. Heavily T2-weighted images of the biliary and pancreatic ducts were obtained.  Post-processing was applied at the acquisition scanner with concurrent physician supervision which includes 3D reconstructions, MIPs, volume rendered images and/or shaded surface rendering.  CONTRAST:  8.56mL GADAVIST GADOBUTROL 1 MMOL/ML IV SOLN  COMPARISON:  MR abdomen dated 12/29/2022  FINDINGS: Decreased sensitivity and specificity for detailed findings due to motion  artifact.  Lower chest: Trace bilateral pleural effusions.  Hepatobiliary: Parenchymal signal abnormality. Scattered subcentimeter T2 hyperintense foci, likely cysts. Increased mild to moderate intrahepatic bile duct dilation more prominent in the left hepatic lobe with new mural thickening of the extrahepatic bile duct (4:16) and enhancement (1304:45). The common bile duct is nondilated. Gallbladder is decompressed with catheter in-situ and demonstrates circumferential mural thickening.  Pancreas: No mass or inflammatory changes. 3 mm T2 hyperintense cystic focus arises directly from the main pancreatic duct in the pancreatic neck (6:20) keeping with side branch intraductal papillary mucinous neoplasms (IPMN). No main ductal dilation, mass lesion, or abnormal enhancement.  Spleen:  Within normal limits in size and appearance.  Adrenals/Urinary Tract: No adrenal nodules. No suspicious renal masses identified. No evidence of hydronephrosis.  Stomach/Bowel: Small proximal duodenal diverticulum. Colonic diverticulosis without acute diverticulitis.  Vascular/Lymphatic: No pathologically enlarged lymph nodes identified. No abdominal aortic aneurysm demonstrated.  Other:  Trace ascites.  Musculoskeletal: No suspicious bone lesions identified. Susceptibility artifact related to median sternotomy wires.  IMPRESSION: 1. Increased mild to moderate intrahepatic bile duct dilation, more prominent in the left hepatic lobe, with new mural thickening and enhancement of the extrahepatic bile duct, suspicious for cholangitis. 2. Decompressed gallbladder with catheter in-situ and circumferential mural thickening. 3. Trace bilateral pleural effusions and trace ascites.  Electronically Signed   By: Agustin Cree M.D.   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  Patient now with worsening hyperbilirubinemia and jaundice and as such presents for cholangiogram and potential cholecystostomy tube exchange.  EXAM: FLUOROSCOPIC GUIDED CHOLECYSTOSTOMY TUBE EXCHANGE  COMPARISON:  MRCP-12/29/2022  Image  guided cholecystostomy tube placement-01/02/2023  MEDICATIONS: None  ANESTHESIA/SEDATION: None  CONTRAST:  15mL OMNIPAQUE IOHEXOL 300 MG/ML SOLN - administered into the gallbladder lumen.  FLUOROSCOPY TIME:  1 minute, 6 seconds (6.1 mGy)  COMPLICATIONS: None immediate.  PROCEDURE: The patient was positioned supine on the fluoroscopy table. The external portion of the existing cholecystostomy tube as well as the surrounding skin was prepped and draped in usual sterile fashion. A time-out was performed prior to the initiation of the procedure.  A preprocedural spot fluoroscopic image was obtained of the right upper abdominal quadrant existing cholecystostomy tube.  The skin surrounding the cholecystostomy tube was anesthetized with 1% lidocaine with epinephrine.  The external portion of the cholecystostomy tube was cut and cannulated with a short Amplatz wire which was advanced through the tube and coiled within the gallbladder lumen  Next, under intermittent fluoroscopic guidance, the existing 10 [place] cholecystostomy tube was exchanged for a new, slightly larger now 10 [place] cholecystostomy tube which was repositioned into the more central aspect of the gallbladder lumen.  Contrast injection confirms appropriate positioning and functionality of the cholecystomy tube.  The cholecystostomy tube was flushed with a small amount of saline and reconnected to a gravity bag. The cholecystostomy tube was secured with an interrupted suture and a Stat Lock device. A dressing was applied.  The patient tolerated the procedure well without immediate postprocedural complication.  FINDINGS: Preprocedural  spot fluoroscopic image demonstrates unchanged positioning of cholecystostomy tube with end coiled and locked over the expected location of the fundus of the gallbladder.  Contrast injection demonstrates appropriate positioning and functionality of the existing cholecystostomy tube. There is opacification of the peripheral aspect of the cystic duct without passage of contrast to the level of the CBD.  After fluoroscopic guided exchange, the new, 10 [place] cholecystostomy tube is appropriately positioned within the gallbladder lumen.  Post exchange cholangiogram demonstrates appropriate positioning and functionality of the new cholecystostomy tube.  IMPRESSION: Successful fluoroscopic guided exchange of 10 [place] cholecystostomy tube.  PLAN: - Cholangiogram demonstrates persistent occlusion of the cystic duct without opacification of the CBD.  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 The patient is visibly jaundiced with worsening hyperbilirubinemia (bilirubin - 3.9 on 11/11, 0.6 on the day of cholecystostomy tube placement) and as such the decision was made to escort the patient to the emergency department for further evaluation and management as the patient will likely require an ERCP to relieve his worsening biliary obstruction.  Electronically Signed   By: Simonne Come M.D.   On: 01/24/2023 11:06     Scheduled inpatient medications:   amLODipine  5 mg Oral Daily   divalproex  750 mg Oral BID   gabapentin  300 mg Oral QHS   metoprolol succinate  25 mg Oral Daily   sodium chloride flush  3 mL Intravenous Q12H   Continuous inpatient infusions:   ampicillin-sulbactam (UNASYN) IV 1.5 g (01/28/23 0838)   PRN inpatient medications: acetaminophen **OR** acetaminophen, diclofenac Sodium, polyethylene glycol  Vital signs in last 24 hours: Temp:  [97.3 F (36.3 C)-98.1 F (36.7 C)] 98  F (36.7 C) (11/26 0756) Pulse Rate:  [76-84] 79 (11/26 0756) Resp:  [17-18] 17 (11/26 0756) BP: (101-133)/(70-85) 114/78 (11/26 0756) SpO2:  [97 %-100 %] 97 % (11/26 0756) Last BM Date : 01/27/23  Intake/Output Summary (Last 24 hours) at 01/28/2023 0949 Last data filed at 01/27/2023 1453 Gross per 24 hour  Intake 483  ml  Output --  Net 483 ml    Intake/Output from previous day: 11/25 0701 - 11/26 0700 In: 483 [P.O.:480; I.V.:3] Out: -  Intake/Output this shift: No intake/output data recorded.   Physical Exam:  General: Alert male in NAD Heart:  Regular rate and rhythm.  Pulmonary: Normal respiratory effort Abdomen: Soft, nondistended, nontender. Normal bowel sounds. Extremities: No lower extremity edema  Neurologic: Alert and oriented Psych: Pleasant. Cooperative. Insight appears normal.      LOS: 2 days   Willette Cluster ,NP 01/28/2023, 9:49 AM

## 2023-01-28 NOTE — Progress Notes (Signed)
PROGRESS NOTE    Samuel Mayo  ZOX:[id number] DOB: October 05, 1945 DOA: 01/24/2023 PCP: [name], [name], MD    Brief Narrative:   Samuel Mayo is a 77 y.o. male with past medical history significant for  hypertension, atrial fibrillation, seizures, gout, MVR status post annuloplasty presenting with worsening jaundice.   Patient was admitted from 10/28 until 11/1.  Had cholecystitis but underwent cholecystostomy tube in setting of cholecystectomy.  Diagnosed with likely Mirizzi syndrome as there is no obstructing stone noted.  Follow-up outpatient and was noted to have worsening LFTs.  Was reevaluated by IR and was seen day of admission for reevaluation of gallbladder and underwent cholecystostomy tube exchange. During imaging today patient noted to have persistent occlusion of the cystic duct with out opacification consistent with 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.  Given worsening laboratory abnormalities patient recommended to proceed to the ED for further evaluation.   Patient reports some lethargy and decreased appetite but denies fever, chills, chest pain, shortness of breath, abdominal pain, constipation, diarrhea, nausea, vomiting.   In the ED, temperature 98.1 F, HR 92, RR 16, BP 118/83, SpO2 98% on room air.  WBC 5.0, hemoglobin 10.3, platelet count 189.  Sodium 127, potassium 3.8, chloride 93, CO2 23, glucose 133, BUN 18, creatinine 0.81.  Lipase 1228.  AST 144, ALT 136, total bilirubin 20.9.  Urinalysis with moderate bilirubin, otherwise unrevealing.  INR 1.2.  Patient received IV fluid bolus in the ED.  General surgery consulted with recommendation of MRCP.  TRH consulted for admission for further evaluation management of obstructive jaundice.   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   Underwent exchange of cholecystostomy tube.  During procedure noted persistent occlusion of the cystic duct without opacification consistent with Mirizzi syndrome not responding appropriately to cholecystostomy.  MRCP with increased mild/moderate intrahepatic bile duct dilation, more prominent left hepatic lobe with new mural thickening, enhancement of the head intrahepatic bile duct suspicious for cholangitis, decompressed gallbladder with catheter in place and circumferential mural thickening. -- General Surgery and [facility] following, appreciate assistance -- AST 144>134>142>128>121 -- ALT 136>117>131>124>120 -- Tbili 20.9>19.4>20.8>21.0>18.8 -- Flush drain every 12 hours, monitor drain output every shift -- Unasyn 1.5 g IV every 8 hours -- Check AFP, CA 19-9 -- CMP daily -- Further per GI/general surgery; plan an ERCP on 11/27, n.p.o. after midnight  Hyponatremia Baseline sodium over the past 1 month 128-132.  Sodium 127 on admission, close to baseline. -- Monitor CMP daily  Essential hypertension -- Amlodipine 5 mg p.o. daily -- Metoprolol succinate 25 mg p.o. daily  Paroxysmal atrial fibrillation -- Metoprolol succinate 25 mg p.o. daily -- Holding Eliquis for need of likely ERCP  Seizure disorder -- Depakote 750 mg PO BID  History of MVR s/p annuloplasty Continue outpatient follow-up with cardiology   DVT prophylaxis: SCDs Start: 01/24/23 1337    Code Status: Full Code Family Communication: Updated patient's spouse present at bedside this morning  Disposition Plan:  Level of care: Med-Surg Status is: Inpatient Remains inpatient appropriate because: ERCP plan for 11/27, further per specialists      Consultants:  General surgery [place] [facility]  Procedures:  Cholecystostomy exchange by IR on 11/22 ERCP pending for 11/27  Antimicrobials:  Unasyn 11/23>>   Subjective: Patient seen examined bedside, resting comfortably.  Lying in bed.  Discussed  with general surgery, Dr. Derrell Lolling  this morning.  Pending GI ERCP planned for tomorrow.  Patient frustrated with the lack of progress and not a fan of the hospital food.  Updated patient's spouse this morning.  Patient denies headache, no dizziness, no chest pain, no shortness of breath, no abdominal pain, no fever/chills/night sweats, no nausea/vomiting/diarrhea, no focal weakness, no fatigue, no paresthesia.  No acute events overnight per nursing.  Objective: Vitals:   01/27/23 1614 01/27/23 2048 01/28/23 0537 01/28/23 0756  BP: 133/85 124/82 101/70 114/78  Pulse: 80 78 84 79  Resp: 18 18 17 17   Temp: 97.8 F (36.6 C) 98.1 F (36.7 C) (!) 97.3 F (36.3 C) 98 F (36.7 C)  TempSrc: Oral   Oral  SpO2: 100% 97% 97% 97%  Weight:      Height:        Intake/Output Summary (Last 24 hours) at 01/28/2023 1044 Last data filed at 01/27/2023 1453 Gross per 24 hour  Intake 240 ml  Output --  Net 240 ml   Filed Weights   01/24/23 0953  Weight: 86.2 kg    Examination:  Physical Exam: GEN: NAD, alert and oriented x 3, wd/wn, + jaundice HEENT: NCAT, PERRL, EOMI, + scleral icterus, MMM PULM: CTAB w/o wheezes/crackles, normal respiratory effort, on room air CV: RRR w/o M/G/R GI: abd soft, NTND, NABS, no R/G/M, noted cholecystostomy drain right side with bilious fluid in collection bag MSK: no peripheral edema, muscle strength globally intact 5/5 bilateral upper/lower extremities NEURO: CN II-XII intact, no focal deficits, sensation to light touch intact PSYCH: normal mood/affect Integumentary: Diffuse jaundice skin, otherwise no other concerning rashes/lesions/wounds noted on exposed skin surfaces dry/intact, no rashes or wounds    Data Reviewed: I have personally reviewed following labs and imaging studies  CBC: Recent Labs  Lab 01/24/23 1020 01/25/23 0548 01/27/23 0700  WBC 5.0 4.6 3.9*  NEUTROABS 3.1  --  1.9  HGB 10.3* 8.9* 9.6*  HCT 29.8* 25.5* 27.4*  MCV 96.1 95.5 96.8   PLT 189 180 163   Basic Metabolic Panel: Recent Labs  Lab 01/24/23 1020 01/25/23 0548 01/26/23 0627 01/27/23 0700 01/28/23 0624  NA 127* 127* 125* 123* 124*  K 3.8 4.0 3.3* 4.0 3.8  CL 93* 96* 94* 95* 96*  CO2 23 22 22  20* 19*  GLUCOSE 133* 83 101* 94 99  BUN 18 11 8 11 11   CREATININE 0.81 0.80 0.83 0.74 0.76  CALCIUM 8.6* 7.9* 7.8* 7.8* 8.0*   GFR: Estimated Creatinine Clearance: 81.1 mL/min (by C-G formula based on SCr of 0.76 mg/dL). Liver Function Tests: Recent Labs  Lab 01/24/23 1020 01/25/23 0548 01/26/23 0627 01/27/23 0700 01/28/23 0624  AST 144* 134* 142* 128* 121*  ALT 136* 117* 131* 124* 120*  ALKPHOS 436* 428* 470* 453* 423*  BILITOT 20.9* 19.4* 20.8* 21.0* 18.8*  PROT 6.0* 4.9* 5.1* 5.1* 5.0*  ALBUMIN 2.5* 2.0* 2.0* 1.9* 1.9*   Recent Labs  Lab 01/24/23 1020 01/26/23 0627  LIPASE 1,228* 737*   No results for input(s): "AMMONIA" in the last 168 hours. Coagulation Profile: Recent Labs  Lab 01/25/23 0548  INR 1.2   Cardiac Enzymes: No results for input(s): "CKTOTAL", "CKMB", "CKMBINDEX", "TROPONINI" in the last 168 hours. BNP (last 3 results) No results for input(s): "PROBNP" in the last 8760 hours. HbA1C: No results for input(s): "HGBA1C" in the last 72 hours. CBG: No results for input(s): "GLUCAP" in the last 168 hours. Lipid Profile: No results for input(s): "CHOL", "HDL", "LDLCALC", "TRIG", "CHOLHDL", "LDLDIRECT" in  the last 72 hours. Thyroid Function Tests: No results for input(s): "TSH", "T4TOTAL", "FREET4", "T3FREE", "THYROIDAB" in the last 72 hours. Anemia Panel: No results for input(s): "VITAMINB12", "FOLATE", "FERRITIN", "TIBC", "IRON", "RETICCTPCT" in the last 72 hours. Sepsis Labs: No results for input(s): "PROCALCITON", "LATICACIDVEN" in the last 168 hours.  No results found for this or any previous visit (from the past 240 hour(s)).       Radiology Studies: No results found.      Scheduled Meds:  amLODipine  5 mg  Oral Daily   divalproex  750 mg Oral BID   gabapentin  300 mg Oral QHS   metoprolol succinate  25 mg Oral Daily   sodium chloride flush  3 mL Intravenous Q12H   Continuous Infusions:  ampicillin-sulbactam (UNASYN) IV 1.5 g (01/28/23 0838)     LOS: 2 days    Time spent: 54 minutes spent on chart review, discussion with nursing staff, consultants, updating family and interview/physical exam; more than 50% of that time was spent in counseling and/or coordination of care.    Alvira Philips [place], DO [facility] Available via Epic secure chat 7am-7pm After these hours, please refer to coverage provider listed on [email or web address] 01/28/2023, 10:44 AM

## 2023-01-28 NOTE — H&P (View-Only) (Signed)
Daily Progress Note  DOA: 01/24/2023 Hospital Day: 5  Chief Complaint: jaundice  ASSESSMENT    Brief Narrative:  Samuel Mayo is a 76 y.o. year old male with a history of  Afib / flutter on Eliquis, mitral valve annuloplasty, HTN, seizure disorder. 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 Underwent percutaneous cholecystomy tube placed. Discharged 01/03/23 on antibiotics. Saw PCP, found to have worsening jaundice. Went for drain study 11/22 and there was non-visualization of CBD so readmitted on 11/22. GI saw on 11/23 for jaundice. 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 Drain study on 11/22 by IR showed persistent occlusion of cystic duct and no opacification of CBD. MRCP showed dilated intrahepatic bile duct with new mural thickening of the extrahepatic bile duct, suspicious for cholangitis  Today:  Alk phos  423 << 453.  Tbili  18.8  << 21    Hyponatremia Na + 124   PAF.  Eliquis on hold   Seizure disorder On Depakote  Principal Problem:   Obstructive jaundice Active Problems:   Hypertension   Atrial fibrillation, chronic (HCC)   Seizure (HCC)   Cholecystitis   Acute biliary pancreatitis   PLAN   --Ongoing discussions with Surgery and IR. IR feels PTC would be high risk in absence of bile duct dilation making it more difficult to access the bile duct. ERCP with stent placement will also be challenging but will proceed with it tomorrow.If unsuccessful then will need PTC.    Subjective   Eating breakfast. No abdominal pain. No remaining questions about the ERCP   Objective    Recent Labs    01/27/23 0700  WBC 3.9*  HGB 9.6*  HCT 27.4*  PLT 163   BMET Recent Labs     01/26/23 0627 01/27/23 0700 01/28/23 0624  NA 125* 123* 124*  K 3.3* 4.0 3.8  CL 94* 95* 96*  CO2 22 20* 19*  GLUCOSE 101* 94 99  BUN 8 11 11   CREATININE 0.83 0.74 0.76  CALCIUM 7.8* 7.8* 8.0*   LFT Recent Labs    01/28/23 0624  PROT 5.0*  ALBUMIN 1.9*  AST 121*  ALT 120*  ALKPHOS 423*  BILITOT 18.8*   PT/INR No results for input(s): "LABPROT", "INR" in the last 72 hours.   Imaging:  MR 3D Recon At Scanner CLINICAL DATA:  Jaundice. History of acute cholecystitis status post percutaneous cholecystostomy tube placement 01/02/2023  EXAM: MRI ABDOMEN WITHOUT AND WITH CONTRAST (INCLUDING MRCP)  TECHNIQUE: Multiplanar multisequence MR imaging of the abdomen was performed both before and after the administration of intravenous contrast. Heavily T2-weighted images of the biliary and pancreatic ducts were obtained.  Post-processing was applied at the acquisition scanner with concurrent physician supervision which includes 3D reconstructions, MIPs, volume rendered images and/or shaded surface rendering.  CONTRAST:  8.70mL GADAVIST GADOBUTROL 1 MMOL/ML IV SOLN  COMPARISON:  MR abdomen dated 12/29/2022  FINDINGS: Decreased sensitivity and specificity for detailed findings due to motion artifact.  Lower chest: Trace bilateral pleural effusions.  Hepatobiliary: Parenchymal signal abnormality. Scattered subcentimeter T2 hyperintense foci, likely cysts. Increased mild to moderate intrahepatic bile duct dilation more prominent in the left hepatic lobe with new mural thickening of the extrahepatic bile duct (4:16) and enhancement (1304:45). The common bile duct is nondilated.  Gallbladder is decompressed with catheter in-situ and demonstrates circumferential mural thickening.  Pancreas: No mass or inflammatory changes. 3 mm T2 hyperintense cystic focus arises directly from the main pancreatic duct in the pancreatic neck (6:20) keeping with side branch  intraductal papillary mucinous neoplasms (IPMN). No main ductal dilation, mass lesion, or abnormal enhancement.  Spleen:  Within normal limits in size and appearance.  Adrenals/Urinary Tract: No adrenal nodules. No suspicious renal masses identified. No evidence of hydronephrosis.  Stomach/Bowel: Small proximal duodenal diverticulum. Colonic diverticulosis without acute diverticulitis.  Vascular/Lymphatic: No pathologically enlarged lymph nodes identified. No abdominal aortic aneurysm demonstrated.  Other:  Trace ascites.  Musculoskeletal: No suspicious bone lesions identified. Susceptibility artifact related to median sternotomy wires.  IMPRESSION: 1. Increased mild to moderate intrahepatic bile duct dilation, more prominent in the left hepatic lobe, with new mural thickening and enhancement of the extrahepatic bile duct, suspicious for cholangitis. 2. Decompressed gallbladder with catheter in-situ and circumferential mural thickening. 3. Trace bilateral pleural effusions and trace ascites.  Electronically Signed   By: Agustin Cree M.D.   On: 01/24/2023 15:54 MR ABDOMEN MRCP W WO CONTAST CLINICAL DATA:  Jaundice. History of acute cholecystitis status post percutaneous cholecystostomy tube placement 01/02/2023  EXAM: MRI ABDOMEN WITHOUT AND WITH CONTRAST (INCLUDING MRCP)  TECHNIQUE: Multiplanar multisequence MR imaging of the abdomen was performed both before and after the administration of intravenous contrast. Heavily T2-weighted images of the biliary and pancreatic ducts were obtained.  Post-processing was applied at the acquisition scanner with concurrent physician supervision which includes 3D reconstructions, MIPs, volume rendered images and/or shaded surface rendering.  CONTRAST:  8.70mL GADAVIST GADOBUTROL 1 MMOL/ML IV SOLN  COMPARISON:  MR abdomen dated 12/29/2022  FINDINGS: Decreased sensitivity and specificity for detailed findings due to motion  artifact.  Lower chest: Trace bilateral pleural effusions.  Hepatobiliary: Parenchymal signal abnormality. Scattered subcentimeter T2 hyperintense foci, likely cysts. Increased mild to moderate intrahepatic bile duct dilation more prominent in the left hepatic lobe with new mural thickening of the extrahepatic bile duct (4:16) and enhancement (1304:45). The common bile duct is nondilated. Gallbladder is decompressed with catheter in-situ and demonstrates circumferential mural thickening.  Pancreas: No mass or inflammatory changes. 3 mm T2 hyperintense cystic focus arises directly from the main pancreatic duct in the pancreatic neck (6:20) keeping with side branch intraductal papillary mucinous neoplasms (IPMN). No main ductal dilation, mass lesion, or abnormal enhancement.  Spleen:  Within normal limits in size and appearance.  Adrenals/Urinary Tract: No adrenal nodules. No suspicious renal masses identified. No evidence of hydronephrosis.  Stomach/Bowel: Small proximal duodenal diverticulum. Colonic diverticulosis without acute diverticulitis.  Vascular/Lymphatic: No pathologically enlarged lymph nodes identified. No abdominal aortic aneurysm demonstrated.  Other:  Trace ascites.  Musculoskeletal: No suspicious bone lesions identified. Susceptibility artifact related to median sternotomy wires.  IMPRESSION: 1. Increased mild to moderate intrahepatic bile duct dilation, more prominent in the left hepatic lobe, with new mural thickening and enhancement of the extrahepatic bile duct, suspicious for cholangitis. 2. Decompressed gallbladder with catheter in-situ and circumferential mural thickening. 3. Trace bilateral pleural effusions and trace ascites.  Electronically Signed   By: Agustin Cree M.D.   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  Patient now with worsening hyperbilirubinemia and jaundice and as such presents for cholangiogram and potential cholecystostomy tube exchange.  EXAM: FLUOROSCOPIC GUIDED CHOLECYSTOSTOMY TUBE EXCHANGE  COMPARISON:  MRCP-12/29/2022  Image  guided cholecystostomy tube placement-01/02/2023  MEDICATIONS: None  ANESTHESIA/SEDATION: None  CONTRAST:  15mL OMNIPAQUE IOHEXOL 300 MG/ML SOLN - administered into the gallbladder lumen.  FLUOROSCOPY TIME:  1 minute, 6 seconds (6.1 mGy)  COMPLICATIONS: None immediate.  PROCEDURE: The patient was positioned supine on the fluoroscopy table. The external portion of the existing cholecystostomy tube as well as the surrounding skin was prepped and draped in usual sterile fashion. A time-out was performed prior to the initiation of the procedure.  A preprocedural spot fluoroscopic image was obtained of the right upper abdominal quadrant existing cholecystostomy tube.  The skin surrounding the cholecystostomy tube was anesthetized with 1% lidocaine with epinephrine.  The external portion of the cholecystostomy tube was cut and cannulated with a short Amplatz wire which was advanced through the tube and coiled within the gallbladder lumen  Next, under intermittent fluoroscopic guidance, the existing 10 [place] cholecystostomy tube was exchanged for a new, slightly larger now 10 [place] cholecystostomy tube which was repositioned into the more central aspect of the gallbladder lumen.  Contrast injection confirms appropriate positioning and functionality of the cholecystomy tube.  The cholecystostomy tube was flushed with a small amount of saline and reconnected to a gravity bag. The cholecystostomy tube was secured with an interrupted suture and a Stat Lock device. A dressing was applied.  The patient tolerated the procedure well without immediate postprocedural complication.  FINDINGS: Preprocedural  spot fluoroscopic image demonstrates unchanged positioning of cholecystostomy tube with end coiled and locked over the expected location of the fundus of the gallbladder.  Contrast injection demonstrates appropriate positioning and functionality of the existing cholecystostomy tube. There is opacification of the peripheral aspect of the cystic duct without passage of contrast to the level of the CBD.  After fluoroscopic guided exchange, the new, 10 [place] cholecystostomy tube is appropriately positioned within the gallbladder lumen.  Post exchange cholangiogram demonstrates appropriate positioning and functionality of the new cholecystostomy tube.  IMPRESSION: Successful fluoroscopic guided exchange of 10 [place] cholecystostomy tube.  PLAN: - Cholangiogram demonstrates persistent occlusion of the cystic duct without opacification of the CBD.  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 The patient is visibly jaundiced with worsening hyperbilirubinemia (bilirubin - 3.9 on 11/11, 0.6 on the day of cholecystostomy tube placement) and as such the decision was made to escort the patient to the emergency department for further evaluation and management as the patient will likely require an ERCP to relieve his worsening biliary obstruction.  Electronically Signed   By: Simonne Come M.D.   On: 01/24/2023 11:06     Scheduled inpatient medications:   amLODipine  5 mg Oral Daily   divalproex  750 mg Oral BID   gabapentin  300 mg Oral QHS   metoprolol succinate  25 mg Oral Daily   sodium chloride flush  3 mL Intravenous Q12H   Continuous inpatient infusions:   ampicillin-sulbactam (UNASYN) IV 1.5 g (01/28/23 0838)   PRN inpatient medications: acetaminophen **OR** acetaminophen, diclofenac Sodium, polyethylene glycol  Vital signs in last 24 hours: Temp:  [97.3 F (36.3 C)-98.1 F (36.7 C)] 98  F (36.7 C) (11/26 0756) Pulse Rate:  [76-84] 79 (11/26 0756) Resp:  [17-18] 17 (11/26 0756) BP: (101-133)/(70-85) 114/78 (11/26 0756) SpO2:  [97 %-100 %] 97 % (11/26 0756) Last BM Date : 01/27/23  Intake/Output Summary (Last 24 hours) at 01/28/2023 0949 Last data filed at 01/27/2023 1453 Gross per 24 hour  Intake 483  ml  Output --  Net 483 ml    Intake/Output from previous day: 11/25 0701 - 11/26 0700 In: 483 [P.O.:480; I.V.:3] Out: -  Intake/Output this shift: No intake/output data recorded.   Physical Exam:  General: Alert male in NAD Heart:  Regular rate and rhythm.  Pulmonary: Normal respiratory effort Abdomen: Soft, nondistended, nontender. Normal bowel sounds. Extremities: No lower extremity edema  Neurologic: Alert and oriented Psych: Pleasant. Cooperative. Insight appears normal.      LOS: 2 days   Willette Cluster ,NP 01/28/2023, 9:49 AM

## 2023-01-28 NOTE — Progress Notes (Signed)
Pharmacy Antibiotic Note  Samuel Mayo is a 77 y.o. male admitted on 01/24/2023 with acute cholecystitis. Pharmacy has been consulted for Unasyn dosing.  Planning ERCP 11/27  Plan: Continue Unasyn 1.5gm IV q6h F/u abx LOT   Height: 5\' 11"  (180.3 cm) Weight: 86.2 kg (190 lb) IBW/kg (Calculated) : 75.3  Temp (24hrs), Avg:97.8 F (36.6 C), Min:97.3 F (36.3 C), Max:98.1 F (36.7 C)  Recent Labs  Lab 01/24/23 1020 01/25/23 0548 01/26/23 0627 01/27/23 0700 01/28/23 0624  WBC 5.0 4.6  --  3.9*  --   CREATININE 0.81 0.80 0.83 0.74 0.76    Estimated Creatinine Clearance: 81.1 mL/min (by C-G formula based on SCr of 0.76 mg/dL).    Allergies  Allergen Reactions   Other Other (See Comments)    Childhood reaction to Neosporin Ophthalmic ointment caused severe eye redness, irritation    Antimicrobials this admission:  Unasyn 11/23 >>   Microbiology results:  10/31 Biliary cx: Raoutella planticola (R-amp)  Thank you for allowing pharmacy to be a part of this patient's care.  Rexford Maus, PharmD, BCPS 01/28/2023 12:55 PM

## 2023-01-28 NOTE — Progress Notes (Signed)
   01/28/23 1032  Mobility  Activity Ambulated with assistance in hallway  Level of Assistance Standby assist, set-up cues, supervision of patient - no hands on  Assistive Device Front wheel walker  Distance Ambulated (ft) 600 ft  Activity Response Tolerated well  Mobility Referral Yes  $Mobility charge 1 Mobility  Mobility Specialist Start Time (ACUTE ONLY) 1020  Mobility Specialist Stop Time (ACUTE ONLY) 1032  Mobility Specialist Time Calculation (min) (ACUTE ONLY) 12 min   Mobility Specialist: Progress Note  Pt agreeable to mobility session - received in chair. Required SB with no AD. Pt was asymptomatic throughout session with no complaints. Returned to chair with all needs met - call bell within reach.  Wife present.   Barnie Mort, BS Mobility Specialist Please contact via SecureChat or Rehab office at [phone].

## 2023-01-28 NOTE — Progress Notes (Signed)
Progress Note     Subjective: Laying in bed. No new complaints  Significant other at bedside  Objective: Vital signs in last 24 hours: Temp:  [97.3 F (36.3 C)-98.1 F (36.7 C)] 98 F (36.7 C) (11/26 0756) Pulse Rate:  [76-84] 79 (11/26 0756) Resp:  [17-18] 17 (11/26 0756) BP: (101-133)/(70-85) 114/78 (11/26 0756) SpO2:  [97 %-100 %] 97 % (11/26 0756) Last BM Date : 01/24/23  Intake/Output from previous day: 11/25 0701 - 11/26 0700 In: 483 [P.O.:480; I.V.:3] Out: -  Intake/Output this shift: No intake/output data recorded.  PE: General: pleasant, WD, male who is laying in bed in NAD Lungs: Respiratory effort nonlabored Abd: soft, NT, ND MSK: all 4 extremities are symmetrical with no cyanosis, clubbing, or edema. Skin: warm and dry  Psych: A&Ox3 with an appropriate affect.    Lab Results:  Recent Labs    01/27/23 0700  WBC 3.9*  HGB 9.6*  HCT 27.4*  PLT 163   BMET Recent Labs    01/26/23 0627 01/27/23 0700  NA 125* 123*  K 3.3* 4.0  CL 94* 95*  CO2 22 20*  GLUCOSE 101* 94  BUN 8 11  CREATININE 0.83 0.74  CALCIUM 7.8* 7.8*   PT/INR No results for input(s): "LABPROT", "INR" in the last 72 hours.  CMP     Component Value Date/Time   NA 123 (L) 01/27/2023 0700   NA 141 05/20/2022 1026   NA 136 02/03/2012 1915   K 4.0 01/27/2023 0700   K 3.8 02/03/2012 1915   CL 95 (L) 01/27/2023 0700   CL 102 02/03/2012 1915   CO2 20 (L) 01/27/2023 0700   CO2 27 02/03/2012 1915   GLUCOSE 94 01/27/2023 0700   GLUCOSE 87 02/03/2012 1915   BUN 11 01/27/2023 0700   BUN 24 05/20/2022 1026   BUN 18 02/03/2012 1915   CREATININE 0.74 01/27/2023 0700   CREATININE 0.90 02/03/2012 1915   CALCIUM 7.8 (L) 01/27/2023 0700   CALCIUM 8.2 (L) 02/03/2012 1915   PROT 5.1 (L) 01/27/2023 0700   PROT 5.9 (L) 05/20/2022 1026   PROT 6.7 02/03/2012 1915   ALBUMIN 1.9 (L) 01/27/2023 0700   ALBUMIN 3.9 05/20/2022 1026   ALBUMIN 3.9 02/03/2012 1915   AST 128 (H) 01/27/2023  0700   AST 31 02/03/2012 1915   ALT 124 (H) 01/27/2023 0700   ALT 31 02/03/2012 1915   ALKPHOS 453 (H) 01/27/2023 0700   ALKPHOS 75 02/03/2012 1915   BILITOT 21.0 (HH) 01/27/2023 0700   BILITOT 0.4 05/20/2022 1026   BILITOT 0.5 02/03/2012 1915   GFRNONAA >60 01/27/2023 0700   GFRNONAA >60 02/03/2012 1915   GFRAA >60 10/07/2019 1330   GFRAA >60 02/03/2012 1915   Lipase     Component Value Date/Time   LIPASE 737 (H) 01/26/2023 0627       Studies/Results: No results found.  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 Freida Busman (hepatobiliary surgery) and recommend ERCP if possible for stent/cytology (per GI, Dr. Meridee Score reviewing today) otherwise recommend IR PTC for further evaluation    FEN: heart, NPO MN ID: unasyn VTE: SCDs   hx of MVR w/ annuloplasty Afib on Eliquis LD 11/22 HTN Seizure disorder  Hyponatemia    I reviewed Consultant GI notes, hospitalist notes, last 24 h vitals and pain scores, last 48 h intake and output, last 24 h labs and trends, and last 24 h imaging results.     LOS: 2 days   Eric Form, [facility] Surgery 01/28/2023, 8:06 AM Please see Amion for pager number during  day hours 7:00am-4:30pm

## 2023-01-29 ENCOUNTER — Encounter (HOSPITAL_COMMUNITY)
Admission: EM | Disposition: A | Discharge status: Skilled Nursing Facility | Payer: Self-pay | Source: Ambulatory Visit | Attending: Internal Medicine

## 2023-01-29 ENCOUNTER — Encounter (HOSPITAL_COMMUNITY): Payer: Self-pay | Admitting: Internal Medicine

## 2023-01-29 ENCOUNTER — Inpatient Hospital Stay (HOSPITAL_COMMUNITY): Payer: Medicare Other

## 2023-01-29 ENCOUNTER — Inpatient Hospital Stay (HOSPITAL_COMMUNITY): Payer: Medicare Other | Admitting: Anesthesiology

## 2023-01-29 DIAGNOSIS — R17 Unspecified jaundice: Secondary | ICD-10-CM | POA: Diagnosis not present

## 2023-01-29 DIAGNOSIS — K831 Obstruction of bile duct: Secondary | ICD-10-CM | POA: Diagnosis not present

## 2023-01-29 HISTORY — PX: ESOPHAGOGASTRODUODENOSCOPY: SHX5428

## 2023-01-29 LAB — COMPREHENSIVE METABOLIC PANEL
ALT: 116 U/L — ABNORMAL HIGH (ref 0–44)
AST: 120 U/L — ABNORMAL HIGH (ref 15–41)
Albumin: 1.9 g/dL — ABNORMAL LOW (ref 3.5–5.0)
Alkaline Phosphatase: 419 U/L — ABNORMAL HIGH (ref 38–126)
Anion gap: 11 (ref 5–15)
BUN: 11 mg/dL (ref 8–23)
CO2: 19 mmol/L — ABNORMAL LOW (ref 22–32)
Calcium: 8.2 mg/dL — ABNORMAL LOW (ref 8.9–10.3)
Chloride: 97 mmol/L — ABNORMAL LOW (ref 98–111)
Creatinine, Ser: 0.76 mg/dL (ref 0.61–1.24)
GFR, Estimated: >60 mL/min (ref >60)
Glucose, Bld: 99 mg/dL (ref 70–99)
Potassium: 3.9 mmol/L (ref 3.5–5.1)
Sodium: 127 mmol/L — ABNORMAL LOW (ref 135–145)
Total Bilirubin: 18.3 mg/dL (ref <1.2)
Total Protein: 5.1 g/dL — ABNORMAL LOW (ref 6.5–8.1)

## 2023-01-29 SURGERY — EGD (ESOPHAGOGASTRODUODENOSCOPY)
Anesthesia: General

## 2023-01-29 MED ORDER — ROCURONIUM BROMIDE 10 MG/ML (PF) SYRINGE
PREFILLED_SYRINGE | INTRAVENOUS | Status: DC | PRN
  Administered 2023-01-29: 50 mg via INTRAVENOUS

## 2023-01-29 MED ORDER — FENTANYL CITRATE (PF) 100 MCG/2ML IJ SOLN
INTRAMUSCULAR | Status: AC
  Filled 2023-01-29: qty 2

## 2023-01-29 MED ORDER — SUCCINYLCHOLINE CHLORIDE 200 MG/10ML IV SOSY
PREFILLED_SYRINGE | INTRAVENOUS | Status: DC | PRN
  Administered 2023-01-29: 160 mg via INTRAVENOUS

## 2023-01-29 MED ORDER — FENTANYL CITRATE (PF) 250 MCG/5ML IJ SOLN
INTRAMUSCULAR | Status: DC | PRN
  Administered 2023-01-29 (×2): 50 ug via INTRAVENOUS

## 2023-01-29 MED ORDER — ONDANSETRON HCL 4 MG/2ML IJ SOLN
INTRAMUSCULAR | Status: DC | PRN
  Administered 2023-01-29: 4 mg via INTRAVENOUS

## 2023-01-29 MED ORDER — DICLOFENAC SUPPOSITORY 100 MG
RECTAL | Status: DC | PRN
  Administered 2023-01-29: 100 mg via RECTAL

## 2023-01-29 MED ORDER — PHENYLEPHRINE 80 MCG/ML (10ML) SYRINGE FOR IV PUSH (FOR BLOOD PRESSURE SUPPORT)
PREFILLED_SYRINGE | INTRAVENOUS | Status: DC | PRN
  Administered 2023-01-29: 160 ug via INTRAVENOUS
  Administered 2023-01-29: 80 ug via INTRAVENOUS

## 2023-01-29 MED ORDER — DEXAMETHASONE SODIUM PHOSPHATE 10 MG/ML IJ SOLN
INTRAMUSCULAR | Status: DC | PRN
  Administered 2023-01-29: 10 mg via INTRAVENOUS

## 2023-01-29 MED ORDER — GLUCAGON HCL RDNA (DIAGNOSTIC) 1 MG IJ SOLR
INTRAMUSCULAR | Status: AC
  Filled 2023-01-29: qty 1

## 2023-01-29 MED ORDER — SUGAMMADEX SODIUM 200 MG/2ML IV SOLN
INTRAVENOUS | Status: DC | PRN
  Administered 2023-01-29: 300 mg via INTRAVENOUS

## 2023-01-29 MED ORDER — EPHEDRINE SULFATE-NACL 50-0.9 MG/10ML-% IV SOSY
PREFILLED_SYRINGE | INTRAVENOUS | Status: DC | PRN
  Administered 2023-01-29 (×2): 10 mg via INTRAVENOUS

## 2023-01-29 MED ORDER — PROPOFOL 10 MG/ML IV BOLUS
INTRAVENOUS | Status: DC | PRN
  Administered 2023-01-29: 50 ug/kg/min via INTRAVENOUS
  Administered 2023-01-29: 110 ug/kg/min via INTRAVENOUS

## 2023-01-29 MED ORDER — DICLOFENAC SUPPOSITORY 100 MG
RECTAL | Status: AC
  Filled 2023-01-29: qty 1

## 2023-01-29 MED ORDER — SODIUM CHLORIDE 0.9 % IV SOLN: INTRAVENOUS | Status: DC | PRN

## 2023-01-29 MED ORDER — PROPOFOL 1000 MG/100ML IV EMUL
INTRAVENOUS | Status: AC
  Filled 2023-01-29: qty 200

## 2023-01-29 MED ORDER — LIDOCAINE 2% (20 MG/ML) 5 ML SYRINGE
INTRAMUSCULAR | Status: DC | PRN
  Administered 2023-01-29: 60 mg via INTRAVENOUS

## 2023-01-29 MED ORDER — EPHEDRINE 5 MG/ML INJ
INTRAVENOUS | Status: AC
  Filled 2023-01-29: qty 5

## 2023-01-29 MED ORDER — CIPROFLOXACIN IN D5W 400 MG/200ML IV SOLN
INTRAVENOUS | Status: AC
  Filled 2023-01-29: qty 200

## 2023-01-29 NOTE — Transfer of Care (Signed)
Immediate Anesthesia Transfer of Care Note  Patient: Samuel Mayo  Procedure(s) Performed: ESOPHAGOGASTRODUODENOSCOPY (EGD)  Patient Location: PACU and Endoscopy Unit  Anesthesia Type:General  Level of Consciousness: awake, alert , and oriented  Airway & Oxygen Therapy: Patient Spontanous Breathing  Post-op Assessment: Report given to RN and Post -op Vital signs reviewed and stable  Post vital signs: Reviewed and stable  Last Vitals:  Vitals Value Taken Time  BP 119/79 01/29/23 1540  Temp 36.4 C 01/29/23 1534  Pulse 78 01/29/23 1542  Resp 13 01/29/23 1542  SpO2 98 % 01/29/23 1542  Vitals shown include unfiled device data.  Last Pain:  Vitals:   01/29/23 1540  TempSrc:   PainSc: 0-No pain      Patients Stated Pain Goal: 0 (01/27/23 2000)  Complications: No notable events documented.

## 2023-01-29 NOTE — Anesthesia Procedure Notes (Signed)
Procedure Name: Intubation Date/Time: 01/29/2023 2:28 PM  Performed by: Jimmey Ralph, CRNAPre-anesthesia Checklist: Patient identified, Emergency Drugs available, Suction available and Patient being monitored Patient Re-evaluated:Patient Re-evaluated prior to induction Oxygen Delivery Method: Circle system utilized Preoxygenation: Pre-oxygenation with 100% oxygen Induction Type: IV induction Ventilation: Mask ventilation without difficulty Laryngoscope Size: Glidescope and 4 Tube type: Oral Endobronchial tube: Right Tube size: 7.5 mm Number of attempts: 1 Airway Equipment and Method: Stylet and Oral airway Placement Confirmation: ETT inserted through vocal cords under direct vision, positive ETCO2 and breath sounds checked- equal and bilateral Secured at: 23 cm Tube secured with: Tape Dental Injury: Teeth and Oropharynx as per pre-operative assessment

## 2023-01-29 NOTE — Anesthesia Preprocedure Evaluation (Signed)
Anesthesia Evaluation  Patient identified by MRN, date of birth, ID band Patient awake    Reviewed: Allergy & Precautions, H&P , NPO status , Patient's Chart, lab work & pertinent test results, reviewed documented beta blocker date and time   Airway Mallampati: II  TM Distance: >3 FB Neck ROM: Full    Dental  (+) Teeth Intact, Dental Advisory Given   Pulmonary neg pulmonary ROS   Pulmonary exam normal breath sounds clear to auscultation       Cardiovascular hypertension (116/80 preop), Pt. on medications and Pt. on home beta blockers Normal cardiovascular exam+ dysrhythmias (eliquis) Atrial Fibrillation  Rhythm:Regular Rate:Normal     Neuro/Psych Seizures -, Well Controlled,   negative psych ROS   GI/Hepatic negative GI ROS, Neg liver ROS,,,  Endo/Other  negative endocrine ROS    Renal/GU negative Renal ROS  negative genitourinary   Musculoskeletal negative musculoskeletal ROS (+)    Abdominal   Peds negative pediatric ROS (+)  Hematology  (+) Blood dyscrasia, anemia Hb 9.6   Anesthesia Other Findings   Reproductive/Obstetrics negative OB ROS                             Anesthesia Physical Anesthesia Plan  ASA: 3  Anesthesia Plan: General   Post-op Pain Management:    Induction: Intravenous  PONV Risk Score and Plan: Dexamethasone, Ondansetron and Treatment may vary due to age or medical condition  Airway Management Planned: Oral ETT  Additional Equipment: None  Intra-op Plan:   Post-operative Plan: Extubation in OR  Informed Consent: I have reviewed the patients History and Physical, chart, labs and discussed the procedure including the risks, benefits and alternatives for the proposed anesthesia with the patient or authorized representative who has indicated his/her understanding and acceptance.     Dental advisory given  Plan Discussed with: CRNA  Anesthesia Plan  Comments:        Anesthesia Quick Evaluation

## 2023-01-29 NOTE — Op Note (Signed)
[facility] Patient Name: Samuel Mayo Procedure Date : 01/29/2023 MRN: [id number] Attending MD: Iva Boop , MD, [id number] Date of Birth: Aug 15, 1945 CSN: [id number] Age: 77 Admit Type: Inpatient Procedure:                ERCP Indications:              Jaundice Providers:                Iva Boop, MD, Stephens Shire RN, RN, Salley Scarlet, Technician, Stephanie Coup, CRNA Referring MD:              Medicines:                General Anesthesia, On continuous intermittent                            Unasyn, diclofenac 100 mg per rectum Complications:            No immediate complications. Estimated Blood Loss:     Estimated blood loss: none. Procedure:                Pre-Anesthesia Assessment:                           - Prior to the procedure, a History and Physical                            was performed, and patient medications and                            allergies were reviewed. The patient's tolerance of                            previous anesthesia was also reviewed. The risks                            and benefits of the procedure and the sedation                            options and risks were discussed with the patient.                            All questions were answered, and informed consent                            was obtained. Prior Anticoagulants: The patient has                            taken no anticoagulant or antiplatelet agents. ASA                            Grade Assessment: III - A patient with severe  systemic disease. After reviewing the risks and                            benefits, the patient was deemed in satisfactory                            condition to undergo the procedure.                           After obtaining informed consent, the scope was                            passed under direct vision. Throughout the                            procedure, the  patient's blood pressure, pulse, and                            oxygen saturations were monitored continuously. The                            [employer] single use                            duodenoscope was introduced through the mouth, The                            procedure was aborted due to inability to enter                            bile duct w/ papilla in diverticulum. The ERCP was                            performed with difficulty due to challenging                            cannulation because of peridiverticular papilla.                            The patient tolerated the procedure well. Scope In: Scope Out: Findings:      A scout film of the abdomen was obtained. Percutaneous drain(s) were       seen. The papilla was just inside of a moderate-large diverticulum and I       could not access with the duodenoscope. A therapeutic gastroscope was       used and I could pass the sphincterotome into the papillary orifice and       the wire advanced toward the pancreas one time. I moved the patient to       left lateral and supine positions but was unable to enter the bile duct       and procedure terminated. Impression:               - The major papilla was located entirely within a  diverticulum - but close to edge.                           - The biliary tree was not examined due to the                            abnormal anatomy - unable to access. Recommendation:           - Return to floor                           ask IR to try PTC                           clears today and solid food tomorrow if ok (papilla                            was manipulated so pancreatitis risk remains)                           GI signing off but available if other ?'s arise                            post PTC Procedure Code(s):        --- Professional ---                           43260, 53, Endoscopic retrograde                             cholangiopancreatography (ERCP); diagnostic,                            including collection of specimen(s) by brushing or                            washing, when performed (separate procedure) Diagnosis Code(s):        --- Professional ---                           R17, Unspecified jaundice CPT copyright 2022 [employer]. All rights reserved. The codes documented in this report are preliminary and upon coder review may  be revised to meet current compliance requirements. Iva Boop, MD 01/29/2023 4:17:11 PM This report has been signed electronically. Number of Addenda: 0

## 2023-01-29 NOTE — Care Management Important Message (Signed)
Important Message  Patient Details  Name: [name] MRN: [id number] Date of Birth: 09-Nov-1945   Important Message Given:  Yes - Medicare IM     Sherilyn Banker 01/29/2023, 1:57 PM

## 2023-01-29 NOTE — Interval H&P Note (Signed)
History and Physical Interval Note:  01/29/2023 1:30 PM  Samuel Mayo  has presented today for surgery, with the diagnosis of biliary obstruction.  The various methods of treatment have been discussed with the patient and family. After consideration of risks, benefits and other options for treatment, the patient has consented to  Procedure(s): ENDOSCOPIC RETROGRADE CHOLANGIOPANCREATOGRAPHY (ERCP) WITH PROPOFOL (N/A) as a surgical intervention.  The patient's history has been reviewed, patient examined, no change in status, stable for surgery.  I have reviewed the patient's chart and labs.  Questions were answered to the patient's satisfaction.     Stan Head

## 2023-01-29 NOTE — Progress Notes (Signed)
PROGRESS NOTE  Samuel Mayo  UMP:[id number] DOB: 13-Dec-1945 DOA: 01/24/2023 PCP: [name], [name], MD   Brief Narrative: Patient is a 77 year old male with history of hypertension, atrial fibrillation, seizures, gout, MVR s/p angioplasty who presented with worsening jaundice.  Patient was recently admitted from 10/28 -11/1 for cholecystitis, diagnosed with Mirizzi syndrome ,underwent cholecystostomy.  Follow-up imaging showed persistent occlusion of the cystic duct, elevated liver enzymes so recommended to go to the emergency department.  Patient also  reported decreased appetite, weakness.  On presentation, he was hemodynamically stable.  Lab work showed sodium of 127, lipase of 1228, elevated liver enzymes, bilirubin of 20.  General surgery, GI consulted.  Plan for ERCP.  Assessment & Plan:  Principal Problem:   Obstructive jaundice Active Problems:   Atrial fibrillation, chronic (HCC)   Hypertension   Seizure (HCC)   Cholecystitis   Acute biliary pancreatitis   Obstructive jaundice: Initially admitted on 10/28 when he presented with jaundice.  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  Underwent percutaneous cholecystomy tube placement on 10/31 and was discharged home with oral antibiotics.  Outpatient labs showed persistently elevated liver enzymes. Drain study on 11/22 by IR showed persistent occlusion of cystic duct and no opacification of CBD.  MRCP showed dilated intrahepatic bile duct with thickening of the extrahepatic bile duct, suspicious for cholangitis.  Continues to have severe elevated bilirubin, alkaline phosphatase, hyponatremia.  General surgery, GI consulted.  Plan for ERCP. if unsuccessful, plan for PTC by IR.  Currently on Unasyn. Denies abdomen pain, nausea or vomiting today.  Remains intensely jaundiced  Hyponatremia: Baseline sodium runs low in the range of 1 20-1 32.  Continue monitor  Hypertension: On amlodipine, Toprol.   Monitor blood pressure  Paroxysmal A-fib: On metoprolol.  Continue to monitor on telemetry.  Eliquis on hold for ERCP  Seizure disorder: On Depakote  History of mitral valve regurgitation: Status post anoplasty.  Follow-up with cardiology as an outpatient          DVT prophylaxis:SCDs Start: 01/24/23 1337     Code Status: Full Code  Family Communication: Partner at bedside on 11/27  Patient status:Inpatient  Patient is from :Home  Anticipated discharge QM:GQQP  Estimated DC date:after full workup   Consultants: GI, general surgery  Procedures: None yet  Antimicrobials:  Anti-infectives (From admission, onward)    Start     Dose/Rate Route Frequency Ordered Stop   01/25/23 1600  ampicillin-sulbactam (UNASYN) 1.5 g in sodium chloride 0.9 % 100 mL IVPB        1.5 g 200 mL/hr over 30 Minutes Intravenous Every 6 hours 01/25/23 1525         Subjective: Patient seen and examined at bedside today.  Hemodynamically stable lying in bed.  He appears comfortable, complains of some weakness.  No nausea or vomiting or abdominal pain.  Objective: Vitals:   01/28/23 2102 01/29/23 0426 01/29/23 0810 01/29/23 0930  BP: 119/73 108/71 113/76 113/76  Pulse: 78 77 81 81  Resp: 17 17 17    Temp: 98.1 F (36.7 C) 97.8 F (36.6 C) 97.9 F (36.6 C)   TempSrc:  Oral    SpO2: 97% 98% 100%   Weight:      Height:        Intake/Output Summary (Last 24 hours) at 01/29/2023 0942 Last data filed at 01/29/2023 0815 Gross per 24 hour  Intake 237 ml  Output 50 ml  Net 187 ml   [employer]  01/24/23 0953  Weight: 86.2 kg    Examination:  General exam: Overall comfortable, not in distress,icteric  HEENT: PERRL Respiratory system:  no wheezes or crackles  Cardiovascular system: S1 & S2 heard, RRR.  Gastrointestinal system: Abdomen is nondistended, soft and nontender.  Right upper quadrant drain Central nervous system: Alert and oriented Extremities: No edema, no clubbing  ,no cyanosis Skin: No rashes, no ulcers,icteric   Data Reviewed: I have personally reviewed following labs and imaging studies  CBC: Recent Labs  Lab 01/24/23 1020 01/25/23 0548 01/27/23 0700  WBC 5.0 4.6 3.9*  NEUTROABS 3.1  --  1.9  HGB 10.3* 8.9* 9.6*  HCT 29.8* 25.5* 27.4*  MCV 96.1 95.5 96.8  PLT 189 180 163   Basic Metabolic Panel: Recent Labs  Lab 01/25/23 0548 01/26/23 0627 01/27/23 0700 01/28/23 0624 01/29/23 0638  NA 127* 125* 123* 124* 127*  K 4.0 3.3* 4.0 3.8 3.9  CL 96* 94* 95* 96* 97*  CO2 22 22 20* 19* 19*  GLUCOSE 83 101* 94 99 99  BUN 11 8 11 11 11   CREATININE 0.80 0.83 0.74 0.76 0.76  CALCIUM 7.9* 7.8* 7.8* 8.0* 8.2*     No results found for this or any previous visit (from the past 240 hour(s)).   Radiology Studies: No results found.  Scheduled Meds:  amLODipine  5 mg Oral Daily   divalproex  750 mg Oral BID   gabapentin  300 mg Oral QHS   indomethacin  100 mg Rectal Once   metoprolol succinate  25 mg Oral Daily   sodium chloride flush  3 mL Intravenous Q12H   Continuous Infusions:  ampicillin-sulbactam (UNASYN) IV 1.5 g (01/29/23 0421)     LOS: 3 days   Burnadette Pop, MD [facility] P11/27/2024, 9:42 AM

## 2023-01-30 DIAGNOSIS — K831 Obstruction of bile duct: Secondary | ICD-10-CM | POA: Diagnosis not present

## 2023-01-30 LAB — COMPREHENSIVE METABOLIC PANEL
ALT: 133 U/L — ABNORMAL HIGH (ref 0–44)
AST: 150 U/L — ABNORMAL HIGH (ref 15–41)
Albumin: 2.1 g/dL — ABNORMAL LOW (ref 3.5–5.0)
Alkaline Phosphatase: 459 U/L — ABNORMAL HIGH (ref 38–126)
Anion gap: 12 (ref 5–15)
BUN: 14 mg/dL (ref 8–23)
CO2: 17 mmol/L — ABNORMAL LOW (ref 22–32)
Calcium: 8.3 mg/dL — ABNORMAL LOW (ref 8.9–10.3)
Chloride: 93 mmol/L — ABNORMAL LOW (ref 98–111)
Creatinine, Ser: 0.85 mg/dL (ref 0.61–1.24)
GFR, Estimated: >60 mL/min (ref >60)
Glucose, Bld: 125 mg/dL — ABNORMAL HIGH (ref 70–99)
Potassium: 4.1 mmol/L (ref 3.5–5.1)
Sodium: 122 mmol/L — ABNORMAL LOW (ref 135–145)
Total Bilirubin: 18.8 mg/dL (ref <1.2)
Total Protein: 5.5 g/dL — ABNORMAL LOW (ref 6.5–8.1)

## 2023-01-30 LAB — SODIUM
Sodium: 126 mmol/L — ABNORMAL LOW (ref 135–145)
Sodium: 127 mmol/L — ABNORMAL LOW (ref 135–145)

## 2023-01-30 LAB — TSH: TSH: 2.808 u[IU]/mL (ref 0.350–4.500)

## 2023-01-30 LAB — CANCER ANTIGEN 19-9: CA 19-9: 822 U/mL — ABNORMAL HIGH (ref 0–35)

## 2023-01-30 LAB — AFP TUMOR MARKER: AFP, Serum, Tumor Marker: 4.6 ng/mL (ref 0.0–8.4)

## 2023-01-30 LAB — OSMOLALITY: Osmolality: 273 mosm/kg — ABNORMAL LOW (ref 275–295)

## 2023-01-30 MED ORDER — UREA 15 G PO PACK
15.0000 g | PACK | Freq: Two times a day (BID) | ORAL | Status: DC
  Administered 2023-01-30 – 2023-02-03 (×9): 15 g via ORAL
  Filled 2023-01-30 (×11): qty 1

## 2023-01-30 MED ORDER — SODIUM CHLORIDE 0.9 % IV SOLN: 2.0000 g | INTRAVENOUS | Status: DC

## 2023-01-30 NOTE — Progress Notes (Signed)
Mobility Specialist Progress Note:   01/30/23 1100  Mobility  Activity Ambulated with assistance in hallway  Level of Assistance Standby assist, set-up cues, supervision of patient - no hands on  Assistive Device None  Distance Ambulated (ft) 550 ft  Activity Response Tolerated well  Mobility Referral Yes  $Mobility charge 1 Mobility  Mobility Specialist Start Time (ACUTE ONLY) 1100  Mobility Specialist Stop Time (ACUTE ONLY) 1120  Mobility Specialist Time Calculation (min) (ACUTE ONLY) 20 min   Pt agreeable to mobility session. Required no physical assistance, only supervision for safety. Back in chair with all needs met.   Addison Lank Mobility Specialist Please contact via SecureChat or  Rehab office at [phone]

## 2023-01-30 NOTE — Progress Notes (Signed)
Patient had a critical bilirubin of 18.8. Provider was made aware.

## 2023-01-30 NOTE — Consult Note (Signed)
Chief Complaint: Patient was seen in consultation today for percutaneous transhepatic cholangiogram with biliary drain placement Chief Complaint  Patient presents with   Jaundice   Fatigue   at the request of Dr Leone Payor  Supervising Physician: Gilmer Mor  Patient Status: [facility] - In-pt  History of Present Illness: Samuel Mayo is a 77 y.o. 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 Increased mild to moderate intrahepatic bile duct dilation, more prominent in the left hepatic lobe, with new mural thickening and enhancement of the extrahepatic bile duct, suspicious for cholangitis. 2. Decompressed gallbladder with catheter in-situ and circumferential mural thickening. 3. Trace bilateral pleural effusions and trace ascites.  ERCP yesterday:  The major papilla was located entirely within a diverticulum- but close to edge. The Biliary tree not examined due to the abnormal anatomy- unable to acess. Rec: ask IR to try PTC  DR Cornett note this am: Jaundice  - admission 10/28-11/1 with acute cholecystitis and adjacent abscess, possible Mirizzi syndrome  - IR drain placement 10/31 - drain study/exchange 11/22 by IR with persistent occlusion of cystic duct and no opacification of CBD - Tbili still significantly elevated 18.8 (21) ERCP unsuccessful Recommend IR consultation for consideration of PTC No other surgical recommendations this point time  Reviewed imaging and chart with Dr Loreta Ave--- h has approved procedure Planned for 11/29 in IR   Past Medical History:  Diagnosis Date   AK (actinic keratosis)    Allergy    Chicken pox    Compression fracture of L1 lumbar vertebra (HCC)    Compression fracture of thoracic vertebra (HCC)    Hypertension    Lentigines    Follows with DR Culton (derma)   Seizures (HCC)     Past Surgical History:  Procedure  Laterality Date   CARDIOVERSION N/A 05/14/2021   Procedure: CARDIOVERSION;  Surgeon: Little Ishikawa, MD;  Location: [facility] ENDOSCOPY;  Service: Cardiovascular;  Laterality: N/A;   CARDIOVERSION N/A 10/08/2021   Procedure: CARDIOVERSION;  Surgeon: Christell Constant, MD;  Location: [facility] ENDOSCOPY;  Service: Cardiovascular;  Laterality: N/A;   COLONOSCOPY     IR EXCHANGE BILIARY DRAIN  01/24/2023   IR PERC CHOLECYSTOSTOMY  01/02/2023   mitral vavle     Congetinal mitral valve disease.    Allergies: Other  Medications: Prior to Admission medications   Medication Sig Start Date End Date Taking? Authorizing Provider  amLODipine (NORVASC) 5 MG tablet TAKE 1 TABLET (5 MG TOTAL) BY MOUTH DAILY. 01/11/22  Yes [name], [name], MD  Cholecalciferol (VITAMIN D-3 PO) Take 1 capsule by mouth every evening.   Yes [provider]  divalproex (DEPAKOTE) 250 MG DR tablet Take 1 tablet (250 mg total) by mouth 2 (two) times daily. Take in addition to 500 mg twice a day (pt should have a total of 750 mg twice a day) 08/26/22  Yes Micki Riley, MD  divalproex (DEPAKOTE) 500 MG DR tablet Take 1 tablet (500 mg total) by mouth 2 (two) times daily. 05/20/22  Yes Micki Riley, MD  ELIQUIS 5 MG TABS tablet TAKE 1 TABLET BY MOUTH TWICE A DAY 08/07/22  Yes Mealor, Roberts Gaudy, MD  gabapentin (NEURONTIN) 300 MG capsule Take 1 capsule (300 mg total) by mouth at bedtime. 09/16/22  Yes Micki Riley, MD  ibandronate (BONIVA) 150 MG tablet TAKE 1 TABLET BY MOUTH EVERY 30 DAYS. TAKE IN  AM WITH FULL GLASS OF WATER ON EMPTY STOMACH AND DON'T TAKE ANYTHING ELSE BY MOUTH OR LIE DOWN FOR THE NEXT 30 MINUTES Patient taking differently: Take 150 mg by mouth See admin instructions. Take 1 tablet (150mg ) once a month around the 20th. 07/22/22  Yes [name], [name], MD  LUTEIN PO Take 1 tablet by mouth every evening.   Yes [provider]  metoprolol succinate (TOPROL-XL) 25 MG 24 hr tablet TAKE 1 TABLET (25 MG  TOTAL) BY MOUTH DAILY. 09/23/22  Yes BranchAlben Spittle, MD  Multiple Vitamin (MULTIVITAMIN WITH MINERALS) TABS tablet Take 1 tablet by mouth every evening.   Yes [provider]  OVER THE COUNTER MEDICATION Take 1 tablet by mouth 2 (two) times daily. Bone Up   Yes [provider]  sodium chloride flush 0.9 % SOLN injection 10 mLs by Intracatheter route as needed. Flush percutaneous cholecystostomy drain with 10 ml normal saline once daily. 01/03/23 02/02/23 Yes Mickie Kay, NP  amoxicillin-clavulanate (AUGMENTIN) 875-125 MG tablet Take 1 tablet by mouth 2 (two) times daily. Patient not taking: Reported on 01/24/2023    [provider]     Family History  Problem Relation Age of Onset   Heart disease Mother    Cancer Father    Heart disease Father    Prostate cancer Father    Aneurysm Paternal Grandfather    Heart disease Son        congenital mitral valve dz   Colon cancer Neg Hx    Esophageal cancer Neg Hx    Stomach cancer Neg Hx    Rectal cancer Neg Hx     Social History   Socioeconomic History   Marital status: Domestic Partner    Spouse name: [name]   Number of children: Not on file   Years of education: Not on file   Highest education level: Bachelor's degree (e.g., BA, AB, BS)  Occupational History   Not on file  Tobacco Use   Smoking status: Never   Smokeless tobacco: Never   Tobacco comments:    Never smoke 10/29/21   Vaping Use   Vaping status: Never Used  Substance and Sexual Activity   Alcohol use: Yes    Alcohol/week: 14.0 standard drinks of alcohol    Types: 14 Standard drinks or equivalent per week    Comment: 2 glasses of wine nightly 10/29/21   Drug use: No   Sexual activity: Not on file  Other Topics Concern   Not on file  Social History Narrative   Lives with wife [name]   R handed   Caffeine: 3 C of coffee AM   Social Determinants of Health   Financial Resource Strain: Low Risk  (10/21/2022)   Overall Financial  Resource Strain (CARDIA)    Difficulty of Paying Living Expenses: Not hard at all  Food Insecurity: No Food Insecurity (01/25/2023)   Hunger Vital Sign    Worried About Running Out of Food in the Last Year: Never true    Ran Out of Food in the Last Year: Never true  Transportation Needs: No Transportation Needs (01/25/2023)   PRAPARE - Administrator, Civil Service (Medical): No    Lack of Transportation (Non-Medical): No  Physical Activity: Insufficiently Active (10/21/2022)   Exercise Vital Sign    Days of Exercise per Week: 3 days    Minutes of Exercise per Session: 40 min  Stress: Stress Concern Present (10/21/2022)   [employer] of Occupational  Health - Occupational Stress Questionnaire    Feeling of Stress : Rather much  Social Connections: Moderately Integrated (10/21/2022)   Social Connection and Isolation Panel [NHANES]    Frequency of Communication with Friends and Family: Three times a week    Frequency of Social Gatherings with Friends and Family: Once a week    Attends Religious Services: Never    Database administrator or Organizations: No    Attends Engineer, structural: More than 4 times per year    Marital Status: Living with partner    Review of Systems: A 12 point ROS discussed and pertinent positives are indicated in the HPI above.  All other systems are negative.  Review of Systems  Constitutional:  Positive for activity change and appetite change. Negative for fever.  Gastrointestinal:  Positive for abdominal pain.  Neurological:  Negative for weakness.  Psychiatric/Behavioral:  Negative for behavioral problems and confusion.     Vital Signs: BP 100/69 (BP Location: Right Arm)   Pulse 68   Temp 97.7 F (36.5 C) (Oral)   Resp 16   Ht 5\' 11"  (1.803 m)   Wt 190 lb (86.2 kg)   SpO2 96%   BMI 26.50 kg/m   Advance Care Plan: The advanced care plan/surrogate decision maker was discussed at the time of visit and documented in the  medical record.    Physical Exam Vitals reviewed.  HENT:     Mouth/Throat:     Mouth: Mucous membranes are moist.  Cardiovascular:     Rate and Rhythm: Normal rate and regular rhythm.     Heart sounds: Normal heart sounds.  Pulmonary:     Effort: Pulmonary effort is normal.     Breath sounds: Normal breath sounds. No wheezing.  Abdominal:     Palpations: Abdomen is soft.  Musculoskeletal:        General: Normal range of motion.  Skin:    Coloration: Skin is jaundiced.  Neurological:     Mental Status: He is alert and oriented to person, place, and time.  Psychiatric:        Behavior: Behavior normal.     Imaging: DG C-Arm 1-60 Min-No Report  Result Date: 01/29/2023 Fluoroscopy was utilized by the requesting physician.  No radiographic interpretation.   MR ABDOMEN MRCP W WO CONTAST  Result Date: 01/24/2023 CLINICAL DATA:  Jaundice. History of acute cholecystitis status post percutaneous cholecystostomy tube placement 01/02/2023 EXAM: MRI ABDOMEN WITHOUT AND WITH CONTRAST (INCLUDING MRCP) TECHNIQUE: Multiplanar multisequence MR imaging of the abdomen was performed both before and after the administration of intravenous contrast. Heavily T2-weighted images of the biliary and pancreatic ducts were obtained. Post-processing was applied at the acquisition scanner with concurrent physician supervision which includes 3D reconstructions, MIPs, volume rendered images and/or shaded surface rendering. CONTRAST:  8.3mL GADAVIST GADOBUTROL 1 MMOL/ML IV SOLN COMPARISON:  MR abdomen dated 12/29/2022 FINDINGS: Decreased sensitivity and specificity for detailed findings due to motion artifact. Lower chest: Trace bilateral pleural effusions. Hepatobiliary: Parenchymal signal abnormality. Scattered subcentimeter T2 hyperintense foci, likely cysts. Increased mild to moderate intrahepatic bile duct dilation more prominent in the left hepatic lobe with new mural thickening of the extrahepatic bile duct  (4:16) and enhancement (1304:45). The common bile duct is nondilated. Gallbladder is decompressed with catheter in-situ and demonstrates circumferential mural thickening. Pancreas: No mass or inflammatory changes. 3 mm T2 hyperintense cystic focus arises directly from the main pancreatic duct in the pancreatic neck (6:20) keeping with side branch intraductal  papillary mucinous neoplasms (IPMN). No main ductal dilation, mass lesion, or abnormal enhancement. Spleen:  Within normal limits in size and appearance. Adrenals/Urinary Tract: No adrenal nodules. No suspicious renal masses identified. No evidence of hydronephrosis. Stomach/Bowel: Small proximal duodenal diverticulum. Colonic diverticulosis without acute diverticulitis. Vascular/Lymphatic: No pathologically enlarged lymph nodes identified. No abdominal aortic aneurysm demonstrated. Other:  Trace ascites. Musculoskeletal: No suspicious bone lesions identified. Susceptibility artifact related to median sternotomy wires. IMPRESSION: 1. Increased mild to moderate intrahepatic bile duct dilation, more prominent in the left hepatic lobe, with new mural thickening and enhancement of the extrahepatic bile duct, suspicious for cholangitis. 2. Decompressed gallbladder with catheter in-situ and circumferential mural thickening. 3. Trace bilateral pleural effusions and trace ascites. Electronically Signed   By: Agustin Cree M.D.   On: 01/24/2023 15:54   MR 3D Recon At Scanner  Result Date: 01/24/2023 CLINICAL DATA:  Jaundice. History of acute cholecystitis status post percutaneous cholecystostomy tube placement 01/02/2023 EXAM: MRI ABDOMEN WITHOUT AND WITH CONTRAST (INCLUDING MRCP) TECHNIQUE: Multiplanar multisequence MR imaging of the abdomen was performed both before and after the administration of intravenous contrast. Heavily T2-weighted images of the biliary and pancreatic ducts were obtained. Post-processing was applied at the acquisition scanner with concurrent  physician supervision which includes 3D reconstructions, MIPs, volume rendered images and/or shaded surface rendering. CONTRAST:  8.62mL GADAVIST GADOBUTROL 1 MMOL/ML IV SOLN COMPARISON:  MR abdomen dated 12/29/2022 FINDINGS: Decreased sensitivity and specificity for detailed findings due to motion artifact. Lower chest: Trace bilateral pleural effusions. Hepatobiliary: Parenchymal signal abnormality. Scattered subcentimeter T2 hyperintense foci, likely cysts. Increased mild to moderate intrahepatic bile duct dilation more prominent in the left hepatic lobe with new mural thickening of the extrahepatic bile duct (4:16) and enhancement (1304:45). The common bile duct is nondilated. Gallbladder is decompressed with catheter in-situ and demonstrates circumferential mural thickening. Pancreas: No mass or inflammatory changes. 3 mm T2 hyperintense cystic focus arises directly from the main pancreatic duct in the pancreatic neck (6:20) keeping with side branch intraductal papillary mucinous neoplasms (IPMN). No main ductal dilation, mass lesion, or abnormal enhancement. Spleen:  Within normal limits in size and appearance. Adrenals/Urinary Tract: No adrenal nodules. No suspicious renal masses identified. No evidence of hydronephrosis. Stomach/Bowel: Small proximal duodenal diverticulum. Colonic diverticulosis without acute diverticulitis. Vascular/Lymphatic: No pathologically enlarged lymph nodes identified. No abdominal aortic aneurysm demonstrated. Other:  Trace ascites. Musculoskeletal: No suspicious bone lesions identified. Susceptibility artifact related to median sternotomy wires. IMPRESSION: 1. Increased mild to moderate intrahepatic bile duct dilation, more prominent in the left hepatic lobe, with new mural thickening and enhancement of the extrahepatic bile duct, suspicious for cholangitis. 2. Decompressed gallbladder with catheter in-situ and circumferential mural thickening. 3. Trace bilateral pleural effusions  and trace ascites. Electronically Signed   By: Agustin Cree M.D.   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 Patient now with worsening hyperbilirubinemia and jaundice and as such presents for cholangiogram and potential cholecystostomy tube exchange. EXAM: FLUOROSCOPIC GUIDED CHOLECYSTOSTOMY TUBE EXCHANGE COMPARISON:  MRCP-12/29/2022 Image guided cholecystostomy tube placement-01/02/2023 MEDICATIONS: None ANESTHESIA/SEDATION: None CONTRAST:  15mL OMNIPAQUE IOHEXOL 300 MG/ML SOLN - administered into the gallbladder lumen. FLUOROSCOPY TIME:  1 minute, 6 seconds (6.1 mGy) COMPLICATIONS: None immediate. PROCEDURE: The patient was positioned supine on the fluoroscopy table. The external portion of the existing cholecystostomy tube as well as the surrounding skin was prepped  and draped in usual sterile fashion. A time-out was performed prior to the initiation of the procedure. A preprocedural spot fluoroscopic image was obtained of the right upper abdominal quadrant existing cholecystostomy tube. The skin surrounding the cholecystostomy tube was anesthetized with 1% lidocaine with epinephrine. The external portion of the cholecystostomy tube was cut and cannulated with a short Amplatz wire which was advanced through the tube and coiled within the gallbladder lumen Next, under intermittent fluoroscopic guidance, the existing 10 [place] cholecystostomy tube was exchanged for a new, slightly larger now 10 [place] cholecystostomy tube which was repositioned into the more central aspect of the gallbladder lumen. Contrast injection confirms appropriate positioning and functionality of the cholecystomy tube. The cholecystostomy tube was flushed with a small amount of saline and reconnected to a gravity bag. The cholecystostomy tube was  secured with an interrupted suture and a Stat Lock device. A dressing was applied. The patient tolerated the procedure well without immediate postprocedural complication. FINDINGS: Preprocedural spot fluoroscopic image demonstrates unchanged positioning of cholecystostomy tube with end coiled and locked over the expected location of the fundus of the gallbladder. Contrast injection demonstrates appropriate positioning and functionality of the existing cholecystostomy tube. There is opacification of the peripheral aspect of the cystic duct without passage of contrast to the level of the CBD. After fluoroscopic guided exchange, the new, 10 [place] cholecystostomy tube is appropriately positioned within the gallbladder lumen. Post exchange cholangiogram demonstrates appropriate positioning and functionality of the new cholecystostomy tube. IMPRESSION: Successful fluoroscopic guided exchange of 10 French cholecystostomy tube. PLAN: - Cholangiogram demonstrates persistent occlusion of the cystic duct without opacification of the CBD. 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 The patient is visibly jaundiced with worsening hyperbilirubinemia (bilirubin - 3.9 on 11/11, 0.6 on the day of cholecystostomy tube placement) and as such the decision was made to escort the patient to the emergency department for further evaluation and management as the patient will likely require an ERCP to relieve his worsening biliary obstruction. Electronically Signed   By: Simonne Come M.D.   On: 01/24/2023 11:06   IR Perc Cholecystostomy  Result Date: 01/02/2023 CLINICAL DATA:  Acute cholecystitis and need for percutaneous cholecystostomy tube due to current operative risks. EXAM: PERCUTANEOUS CHOLECYSTOSTOMY TUBE PLACEMENT COMPARISON:  Ultrasound of the right upper quadrant on 12/26/2022 and MRI of the abdomen on 12/29/2022 ANESTHESIA/SEDATION: Moderate  (conscious) sedation was employed during this procedure. A total of Versed 1.0 mg and Fentanyl 50 mcg was administered intravenously. Moderate Sedation Time: 16 minutes. The patient's level of consciousness and vital signs were monitored continuously by radiology nursing throughout the procedure under my direct supervision. CONTRAST:  7mL OMNIPAQUE IOHEXOL 300 MG/ML  SOLN MEDICATIONS: No additional medications. The patient is receiving IV antibiotics in the hospital. FLUOROSCOPY TIME:  54 seconds.  6.0 mGy. PROCEDURE: The procedure, risks, benefits, and alternatives were explained to the patient. Questions regarding the procedure were encouraged and answered. The patient understands and consents to the procedure. A time-out was performed prior to initiating the procedure. The right abdominal wall was prepped with chlorhexidine in a sterile fashion, and a sterile drape was applied covering the operative field. A sterile gown and sterile gloves were used for the procedure. Local anesthesia was provided with 1% Lidocaine. Ultrasound image documentation was performed. Fluoroscopy during the procedure and fluoro spot radiograph confirms appropriate catheter position. Ultrasound was utilized to localize the gallbladder. Under direct ultrasound guidance, a 21 gauge  needle was advanced via a transhepatic approach into the gallbladder lumen. Aspiration was performed and a bile sample sent for culture studies. A small amount of diluted contrast material was injected. A guide wire was then advanced into the gallbladder. A transitional dilator was placed. Percutaneous tract dilatation was then performed over a guide wire to 10-French. A 10-French pigtail drainage catheter was then advanced into the gallbladder lumen under fluoroscopy. Catheter was formed and injected with contrast material to confirm position. The catheter was flushed and connected to a gravity drainage bag. It was secured at the skin with a Prolene retention  suture and Stat-Lock device. COMPLICATIONS: None FINDINGS: After needle puncture of the gallbladder, there was return of purulent bile. A bile sample was aspirated and sent for culture. The cholecystostomy tube was advanced into the gallbladder lumen and formed. It is now draining purulent fluid. This tube will be left to gravity drainage. IMPRESSION: Percutaneous cholecystostomy with placement of 10-French drainage catheter into the gallbladder lumen. This was left to gravity drainage. There is return of purulent fluid from the gallbladder lumen. Electronically Signed   By: Irish Lack M.D.   On: 01/02/2023 11:19    Labs:  CBC: Recent Labs    01/13/23 0901 01/24/23 1020 01/25/23 0548 01/27/23 0700  WBC 5.1 5.0 4.6 3.9*  HGB 12.0* 10.3* 8.9* 9.6*  HCT 35.1* 29.8* 25.5* 27.4*  PLT 332.0 189 180 163    COAGS: Recent Labs    12/30/22 2151 12/31/22 0705 12/31/22 1747 01/01/23 1154 01/25/23 0548  INR 1.3*  --   --   --  1.2  APTT 36 34 61* 43*  --     BMP: Recent Labs    01/27/23 0700 01/28/23 0624 01/29/23 0638 01/30/23 0542  NA 123* 124* 127* 122*  K 4.0 3.8 3.9 4.1  CL 95* 96* 97* 93*  CO2 20* 19* 19* 17*  GLUCOSE 94 99 99 125*  BUN 11 11 11 14   CALCIUM 7.8* 8.0* 8.2* 8.3*  CREATININE 0.74 0.76 0.76 0.85  GFRNONAA >60 >60 >60 >60    LIVER FUNCTION TESTS: Recent Labs    01/27/23 0700 01/28/23 0624 01/29/23 0638 01/30/23 0542  BILITOT 21.0* 18.8* 18.3* 18.8*  AST 128* 121* 120* 150*  ALT 124* 120* 116* 133*  ALKPHOS 453* 423* 419* 459*  PROT 5.1* 5.0* 5.1* 5.5*  ALBUMIN 1.9* 1.9* 1.9* 2.1*    TUMOR MARKERS: No results for input(s): "AFPTM", "CEA", "CA199", "CHROMGRNA" in the last 8760 hours.  Assessment and Plan:  Scheduled for percutaneous transhepatic cholangiogram with Biliary drain placement in IR 11/29 Risks and benefits of percutaneous transhepatic cholangiogram with Biliary drain placement  discussed with the patient including, but not  limited to bleeding, infection which may lead to sepsis or even death and damage to adjacent structures.  This interventional procedure involves the use of X-rays and because of the nature of the planned procedure, it is possible that we will have prolonged use of X-ray fluoroscopy.  Potential radiation risks to you include (but are not limited to) the following: - A slightly elevated risk for cancer  several years later in life. This risk is typically less than 0.5% percent. This risk is low in comparison to the normal incidence of human cancer, which is 33% for women and 50% for men according to the [employer] Society. - Radiation induced injury can include skin redness, resembling a rash, tissue breakdown / ulcers and hair loss (which can be temporary or permanent).  The likelihood of either of these occurring depends on the difficulty of the procedure and whether you are sensitive to radiation due to previous procedures, disease, or genetic conditions.   IF your procedure requires a prolonged use of radiation, you will be notified and given written instructions for further action.  It is your responsibility to monitor the irradiated area for the 2 weeks following the procedure and to notify your physician if you are concerned that you have suffered a radiation induced injury.    All of the patient's questions were answered, patient is agreeable to proceed.  Consent signed and in chart.   Thank you for this interesting consult.  I greatly enjoyed meeting Samuel Mayo and look forward to participating in their care.  A copy of this report was sent to the requesting provider on this date.  Electronically Signed: Robet Leu, PA-C 01/30/2023, 9:59 AM   I spent a total of 40 Minutes    in face to face in clinical consultation, greater than 50% of which was counseling/coordinating care for PTC/Biliary drain

## 2023-01-30 NOTE — Consult Note (Signed)
Nephrology Consult   Requesting provider: Burnadette Pop Service requesting consult: Hospitalist Reason for consult: Hyponatremia   Assessment/Recommendations: Samuel Mayo is a/an 77 y.o. male with a past medical history HTN, Afib, seizures, gout, MVR s/p angioplasty who presents with jaundice.  Euvolemic Hyponatremia: maybe slightly volume excess given LEE but this sounds more chronic. Also possible ascites but also seems minimal and was trace on MRI from 11/22. Seems largely asymptomatic. Repeat Na now at 126 -obtain urine studies -limit free water to 1.5L -start urea 15g BID -Repeat Na again this evening -watch for abnormal neurological status -consider lasix  Obstructive jaundice: Details per history.  Currently on antibiotics.  IR planning for PTC with biliary drain tomorrow  Hypertension: On amlodipine and metoprolol.  Blood pressures very low around 90-100 systolic.  Will stop amlodipine  Atrial fibrillation: Continue home metoprolol  History of seizure: Home medications    Recommendations conveyed to primary service.    Darnell Level [facility] 01/30/2023 1:32 PM   _____________________________________________________________________________________ CC: jaundice  History of Present Illness: Samuel Mayo is a/an 77 y.o. male with a past medical history of HTN, Afib, seizures, gout, MVR s/p angioplasty who presents with jaundice.   Patient was admitted for cholecystitis in late October.  He was diagnosed with Marici syndrome and underwent a cholecystostomy tube placement on 10/31 and was discharged with antibiotics.  Outpatient labs demonstrated persistently abnormal liver numbers and study by IR on 11/22 showed persistent occlusion of the cystic duct.  MRCP showed dilated intrahepatic bile ducts suspicious for cholangitis.  He continued to have significantly abnormal liver test and was admitted again 6 days ago.  Patient states  that his sodium has been slightly low for the last couple weeks.  He feels like his appetite has been pretty poor.  Over the last day or 2 his appetite is improved and he ate more and drink more.  He denies significant confusion, nausea, vomiting, fevers, chills, shortness of breath, chest pain, dysuria, hematuria.  He does note that his urine is very orange.  Sodium has sometimes dipped slightly low in the past but was normal in March 2024.  Starting in August sodium decreased to 130.  During this hospitalization sodium is fluctuated as low as 122 this morning.  Most recent sodium is 126.  He has noticed for the past couple years that he has slight edema in his lower extremities.    Medications:  Current Facility-Administered Medications  Medication Dose Route Frequency Provider Last Rate Last Admin   acetaminophen (TYLENOL) tablet 650 mg  650 mg Oral Q6H PRN Synetta Fail, MD   650 mg at 01/26/23 1744   Or   acetaminophen (TYLENOL) suppository 650 mg  650 mg Rectal Q6H PRN Synetta Fail, MD       amLODipine (NORVASC) tablet 5 mg  5 mg Oral Daily Synetta Fail, MD   5 mg at 01/29/23 0930   ampicillin-sulbactam (UNASYN) 1.5 g in sodium chloride 0.9 % 100 mL IVPB  1.5 g Intravenous Q6H Esterwood, Amy S, PA-C 200 mL/hr at 01/30/23 0933 1.5 g at 01/30/23 0933   diclofenac Sodium (VOLTAREN) 1 % topical gel 2 g  2 g Topical QID PRN Anthoney Harada, NP       divalproex (DEPAKOTE) DR tablet 750 mg  750 mg Oral BID Synetta Fail, MD   750 mg at 01/30/23 4098   gabapentin (NEURONTIN) capsule 300 mg  300 mg Oral QHS Synetta Fail,  MD   300 mg at 01/29/23 2137   metoprolol succinate (TOPROL-XL) 24 hr tablet 25 mg  25 mg Oral Daily Synetta Fail, MD   25 mg at 01/29/23 0930   polyethylene glycol (MIRALAX / GLYCOLAX) packet 17 g  17 g Oral Daily PRN Synetta Fail, MD       sodium chloride flush (NS) 0.9 % injection 3 mL  3 mL Intravenous Q12H Synetta Fail, MD    3 mL at 01/30/23 7253   urea (URE-NA) oral packet 15 g  15 g Oral BID Darnell Level, MD         ALLERGIES Other  MEDICAL HISTORY Past Medical History:  Diagnosis Date   AK (actinic keratosis)    Allergy    Chicken pox    Compression fracture of L1 lumbar vertebra (HCC)    Compression fracture of thoracic vertebra (HCC)    Hypertension    Lentigines    Follows with DR Culton (derma)   Seizures (HCC)      SOCIAL HISTORY Social History   Socioeconomic History   Marital status: Domestic Partner    Spouse name: [name]   Number of children: Not on file   Years of education: Not on file   Highest education level: Bachelor's degree (e.g., BA, AB, BS)  Occupational History   Not on file  Tobacco Use   Smoking status: Never   Smokeless tobacco: Never   Tobacco comments:    Never smoke 10/29/21   Vaping Use   Vaping status: Never Used  Substance and Sexual Activity   Alcohol use: Yes    Alcohol/week: 14.0 standard drinks of alcohol    Types: 14 Standard drinks or equivalent per week    Comment: 2 glasses of wine nightly 10/29/21   Drug use: No   Sexual activity: Not on file  Other Topics Concern   Not on file  Social History Narrative   Lives with wife [name]   R handed   Caffeine: 3 C of coffee AM   Social Determinants of Health   Financial Resource Strain: Low Risk  (10/21/2022)   Overall Financial Resource Strain (CARDIA)    Difficulty of Paying Living Expenses: Not hard at all  Food Insecurity: No Food Insecurity (01/25/2023)   Hunger Vital Sign    Worried About Running Out of Food in the Last Year: Never true    Ran Out of Food in the Last Year: Never true  Transportation Needs: No Transportation Needs (01/25/2023)   PRAPARE - Administrator, Civil Service (Medical): No    Lack of Transportation (Non-Medical): No  Physical Activity: Insufficiently Active (10/21/2022)   Exercise Vital Sign    Days of Exercise per Week: 3 days    Minutes of  Exercise per Session: 40 min  Stress: Stress Concern Present (10/21/2022)   [employer] of Occupational Health - Occupational Stress Questionnaire    Feeling of Stress : Rather much  Social Connections: Moderately Integrated (10/21/2022)   Social Connection and Isolation Panel [NHANES]    Frequency of Communication with Friends and Family: Three times a week    Frequency of Social Gatherings with Friends and Family: Once a week    Attends Religious Services: Never    Database administrator or Organizations: No    Attends Engineer, structural: More than 4 times per year    Marital Status: Living with partner  Intimate Partner Violence: Not At Risk (01/25/2023)  Humiliation, Afraid, Rape, and Kick questionnaire    Fear of Current or Ex-Partner: No    Emotionally Abused: No    Physically Abused: No    Sexually Abused: No     FAMILY HISTORY Family History  Problem Relation Age of Onset   Heart disease Mother    Cancer Father    Heart disease Father    Prostate cancer Father    Aneurysm Paternal Grandfather    Heart disease Son        congenital mitral valve dz   Colon cancer Neg Hx    Esophageal cancer Neg Hx    Stomach cancer Neg Hx    Rectal cancer Neg Hx       Review of Systems: 12 systems reviewed Otherwise as per HPI, all other systems reviewed and negative  Physical Exam: Vitals:   01/30/23 0327 01/30/23 0818  BP: 93/67 100/69  Pulse: 72 68  Resp: 18 16  Temp: 97.7 F (36.5 C)   SpO2: 97% 96%   No intake/output data recorded.  Intake/Output Summary (Last 24 hours) at 01/30/2023 1332 Last data filed at 01/30/2023 1914 Gross per 24 hour  Intake 1130 ml  Output 50 ml  Net 1080 ml   General: well-appearing, no acute distress HEENT: jaundice, oropharynx clear without lesions CV: normal rate, no rub, 1+ edema to mid shin Lungs: clear to auscultation bilaterally, normal work of breathing Abd: soft, non-tender, non-distended Skin: no visible  lesions or rashes Psych: alert, engaged, appropriate mood and affect Musculoskeletal: no obvious deformities Neuro: normal speech, no gross focal deficits   Test Results Reviewed Lab Results  Component Value Date   NA 126 (L) 01/30/2023   K 4.1 01/30/2023   CL 93 (L) 01/30/2023   CO2 17 (L) 01/30/2023   BUN 14 01/30/2023   CREATININE 0.85 01/30/2023   GFR 64.83 01/13/2023   CALCIUM 8.3 (L) 01/30/2023   ALBUMIN 2.1 (L) 01/30/2023   PHOS 3.3 01/01/2023    CBC Recent Labs  Lab 01/24/23 1020 01/25/23 0548 01/27/23 0700  WBC 5.0 4.6 3.9*  NEUTROABS 3.1  --  1.9  HGB 10.3* 8.9* 9.6*  HCT 29.8* 25.5* 27.4*  MCV 96.1 95.5 96.8  PLT 189 180 163    I have reviewed all relevant outside healthcare records related to the patient's current hospitalization

## 2023-01-30 NOTE — Plan of Care (Signed)

## 2023-01-30 NOTE — Progress Notes (Signed)
1 Day Post-Op   Subjective/Chief Complaint: ERCP unsuccessful yesterday  Patient will require PTC with IR  He is aware of this.  He had no other questions.   Objective: Vital signs in last 24 hours: Temp:  [97.5 F (36.4 C)-97.7 F (36.5 C)] 97.7 F (36.5 C) (11/28 0327) Pulse Rate:  [68-84] 68 (11/28 0818) Resp:  [14-22] 16 (11/28 0818) BP: (93-121)/(65-84) 100/69 (11/28 0818) SpO2:  [96 %-98 %] 96 % (11/28 0818) Last BM Date : 01/29/23  Intake/Output from previous day: 11/27 0701 - 11/28 0700 In: 1130 [P.O.:480; I.V.:650] Out: 50 [Drains:50] Intake/Output this shift: No intake/output data recorded.  Abdomen: Drain in place  Lab Results:  No results for input(s): "WBC", "HGB", "HCT", "PLT" in the last 72 hours. BMET Recent Labs    01/29/23 0638 01/30/23 0542  NA 127* 122*  K 3.9 4.1  CL 97* 93*  CO2 19* 17*  GLUCOSE 99 125*  BUN 11 14  CREATININE 0.76 0.85  CALCIUM 8.2* 8.3*   PT/INR No results for input(s): "LABPROT", "INR" in the last 72 hours. ABG No results for input(s): "PHART", "HCO3" in the last 72 hours.  Invalid input(s): "PCO2", "PO2"  Studies/Results: DG C-Arm 1-60 Min-No Report  Result Date: 01/29/2023 Fluoroscopy was utilized by the requesting physician.  No radiographic interpretation.    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  Straightforward

## 2023-01-30 NOTE — Progress Notes (Signed)
PROGRESS NOTE  Samuel Mayo  MWU:[id number] DOB: 15-Sep-1945 DOA: 01/24/2023 PCP: [name], [name], MD   Brief Narrative: Patient is a 77 year old male with history of hypertension, atrial fibrillation, seizures, gout, MVR s/p angioplasty who presented with worsening jaundice.  Patient was recently admitted from 10/28 -11/1 for cholecystitis, diagnosed with Mirizzi syndrome ,underwent cholecystostomy.  Follow-up imaging showed persistent occlusion of the cystic duct, elevated liver enzymes so recommended to go to the emergency department.  Patient also  reported decreased appetite, weakness.  On presentation, he was hemodynamically stable.  Lab work showed sodium of 127, lipase of 1228, elevated liver enzymes, bilirubin of 20.  General surgery, GI consulted.  Attempted  ERCP but turned out to be unsuccessful.  IR planning for PTC with biliary drain placement tomorrow.  Assessment & Plan:  Principal Problem:   Obstructive jaundice Active Problems:   Atrial fibrillation, chronic (HCC)   Hypertension   Seizure (HCC)   Cholecystitis   Acute biliary pancreatitis   Obstructive jaundice: Initially admitted on 10/28 when he presented with jaundice.  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  Underwent percutaneous cholecystomy tube placement on 10/31 and was discharged home with oral antibiotics.  Outpatient labs showed persistently elevated liver enzymes. Drain study on 11/22 by IR showed persistent occlusion of cystic duct and no opacification of CBD.  MRCP showed dilated intrahepatic bile duct with thickening of the extrahepatic bile duct, suspicious for cholangitis.  Continues to have severe elevated bilirubin, alkaline phosphatase, hyponatremia.  General surgery, GI consulted.  Currently on Unasyn. Denies abdomen pain, nausea or vomiting today.  Remains intensely jaundiced. Attempted  ERCP on 11/27  but turned out to be unsuccessful.  IR planning for PTC  with biliary drain placement tomorrow.  Hyponatremia: Baseline sodium runs low in the range of 1 20-1 32.  Sodium dropped to the range of 122 today.  Nephrology consulted.  Patient appears euvolemic.  Hypertension: On amlodipine, Toprol.  Monitor blood pressure  Paroxysmal A-fib: On metoprolol.  Continue to monitor on telemetry.  Eliquis on hold for upcoming IR procedure  Seizure disorder: On Depakote  History of mitral valve regurgitation: Status post anoplasty.  Follow-up with cardiology as an outpatient          DVT prophylaxis:SCDs Start: 01/24/23 1337     Code Status: Full Code  Family Communication: Partner at bedside on 11/28  Patient status:Inpatient  Patient is from :Home  Anticipated discharge VO:ZDGU  Estimated DC date:after full workup   Consultants: GI, general surgery  Procedures: None yet  Antimicrobials:  Anti-infectives (From admission, onward)    Start     Dose/Rate Route Frequency Ordered Stop   01/31/23 0000  cefOXitin (MEFOXIN) 2 g in sodium chloride 0.9 % 100 mL IVPB  Status:  Discontinued        2 g 200 mL/hr over 30 Minutes Intravenous To Radiology 01/30/23 1014 01/30/23 1015   01/25/23 1600  ampicillin-sulbactam (UNASYN) 1.5 g in sodium chloride 0.9 % 100 mL IVPB        1.5 g 200 mL/hr over 30 Minutes Intravenous Every 6 hours 01/25/23 1525         Subjective: Patient seen and examined at bedside today.  Not in any kind of distress.  Lying in bed.  Comfortable.  Denies abdomen pain, nausea vomiting.  Jaundice continues.  Objective: Vitals:   01/29/23 1626 01/29/23 2015 01/30/23 0327 01/30/23 0818  BP: 116/65 115/76 93/67 100/69  Pulse: 78 84 72  68  Resp: 17 18 18 16   Temp: 97.6 F (36.4 C) (!) 97.5 F (36.4 C) 97.7 F (36.5 C)   TempSrc: Axillary Oral Oral   SpO2: 98% 97% 97% 96%  Weight:      Height:        Intake/Output Summary (Last 24 hours) at 01/30/2023 1018 Last data filed at 01/30/2023 0649 Gross per 24 hour   Intake 1130 ml  Output 50 ml  Net 1080 ml   Filed Weights   01/24/23 0953  Weight: 86.2 kg    Examination:  General exam: Overall comfortable, not in distress,icteric HEENT: PERRL Respiratory system:  no wheezes or crackles  Cardiovascular system: S1 & S2 heard, RRR.  Gastrointestinal system: Abdomen is nondistended, soft and nontender.  Right upper quadrant drain Central nervous system: Alert and oriented Extremities: No edema, no clubbing ,no cyanosis Skin: No rashes, no ulcers,icterus     Data Reviewed: I have personally reviewed following labs and imaging studies  CBC: Recent Labs  Lab 01/24/23 1020 01/25/23 0548 01/27/23 0700  WBC 5.0 4.6 3.9*  NEUTROABS 3.1  --  1.9  HGB 10.3* 8.9* 9.6*  HCT 29.8* 25.5* 27.4*  MCV 96.1 95.5 96.8  PLT 189 180 163   Basic Metabolic Panel: Recent Labs  Lab 01/26/23 0627 01/27/23 0700 01/28/23 0624 01/29/23 0638 01/30/23 0542  NA 125* 123* 124* 127* 122*  K 3.3* 4.0 3.8 3.9 4.1  CL 94* 95* 96* 97* 93*  CO2 22 20* 19* 19* 17*  GLUCOSE 101* 94 99 99 125*  BUN 8 11 11 11 14   CREATININE 0.83 0.74 0.76 0.76 0.85  CALCIUM 7.8* 7.8* 8.0* 8.2* 8.3*     No results found for this or any previous visit (from the past 240 hour(s)).   Radiology Studies: DG C-Arm 1-60 Min-No Report  Result Date: 01/29/2023 Fluoroscopy was utilized by the requesting physician.  No radiographic interpretation.    Scheduled Meds:  amLODipine  5 mg Oral Daily   divalproex  750 mg Oral BID   gabapentin  300 mg Oral QHS   indomethacin  100 mg Rectal Once   metoprolol succinate  25 mg Oral Daily   sodium chloride flush  3 mL Intravenous Q12H   Continuous Infusions:  ampicillin-sulbactam (UNASYN) IV 1.5 g (01/30/23 0933)     LOS: 4 days   Burnadette Pop, MD [facility] P11/28/2024, 10:18 AM

## 2023-01-31 ENCOUNTER — Encounter (HOSPITAL_COMMUNITY): Payer: Self-pay | Admitting: Internal Medicine

## 2023-01-31 ENCOUNTER — Inpatient Hospital Stay (HOSPITAL_COMMUNITY): Payer: Medicare Other

## 2023-01-31 DIAGNOSIS — G40909 Epilepsy, unspecified, not intractable, without status epilepticus: Secondary | ICD-10-CM

## 2023-01-31 DIAGNOSIS — K831 Obstruction of bile duct: Secondary | ICD-10-CM | POA: Diagnosis not present

## 2023-01-31 DIAGNOSIS — I4891 Unspecified atrial fibrillation: Secondary | ICD-10-CM | POA: Diagnosis not present

## 2023-01-31 DIAGNOSIS — K810 Acute cholecystitis: Secondary | ICD-10-CM | POA: Diagnosis not present

## 2023-01-31 DIAGNOSIS — E871 Hypo-osmolality and hyponatremia: Secondary | ICD-10-CM | POA: Diagnosis not present

## 2023-01-31 DIAGNOSIS — E440 Moderate protein-calorie malnutrition: Secondary | ICD-10-CM | POA: Insufficient documentation

## 2023-01-31 DIAGNOSIS — Z7901 Long term (current) use of anticoagulants: Secondary | ICD-10-CM

## 2023-01-31 HISTORY — PX: IR BILIARY DRAIN PLACEMENT WITH CHOLANGIOGRAM: IMG6043

## 2023-01-31 LAB — BASIC METABOLIC PANEL
Anion gap: 10 (ref 5–15)
BUN: 24 mg/dL — ABNORMAL HIGH (ref 8–23)
CO2: 23 mmol/L (ref 22–32)
Calcium: 8.4 mg/dL — ABNORMAL LOW (ref 8.9–10.3)
Chloride: 95 mmol/L — ABNORMAL LOW (ref 98–111)
Creatinine, Ser: 0.89 mg/dL (ref 0.61–1.24)
GFR, Estimated: >60 mL/min (ref >60)
Glucose, Bld: 108 mg/dL — ABNORMAL HIGH (ref 70–99)
Potassium: 4.1 mmol/L (ref 3.5–5.1)
Sodium: 128 mmol/L — ABNORMAL LOW (ref 135–145)

## 2023-01-31 LAB — CBC
HCT: 27.7 % — ABNORMAL LOW (ref 39.0–52.0)
Hemoglobin: 9.5 g/dL — ABNORMAL LOW (ref 13.0–17.0)
MCH: 33.7 pg (ref 26.0–34.0)
MCHC: 34.3 g/dL (ref 30.0–36.0)
MCV: 98.2 fL (ref 80.0–100.0)
Platelets: 205 10*3/uL (ref 150–400)
RBC: 2.82 MIL/uL — ABNORMAL LOW (ref 4.22–5.81)
RDW: 20.8 % — ABNORMAL HIGH (ref 11.5–15.5)
WBC: 5 10*3/uL (ref 4.0–10.5)
nRBC: 0 % (ref 0.0–0.2)

## 2023-01-31 LAB — COMPREHENSIVE METABOLIC PANEL
ALT: 132 U/L — ABNORMAL HIGH (ref 0–44)
AST: 150 U/L — ABNORMAL HIGH (ref 15–41)
Albumin: 1.9 g/dL — ABNORMAL LOW (ref 3.5–5.0)
Alkaline Phosphatase: 443 U/L — ABNORMAL HIGH (ref 38–126)
Anion gap: 8 (ref 5–15)
BUN: 33 mg/dL — ABNORMAL HIGH (ref 8–23)
CO2: 21 mmol/L — ABNORMAL LOW (ref 22–32)
Calcium: 8.4 mg/dL — ABNORMAL LOW (ref 8.9–10.3)
Chloride: 97 mmol/L — ABNORMAL LOW (ref 98–111)
Creatinine, Ser: 0.79 mg/dL (ref 0.61–1.24)
GFR, Estimated: >60 mL/min (ref >60)
Glucose, Bld: 96 mg/dL (ref 70–99)
Potassium: 3.9 mmol/L (ref 3.5–5.1)
Sodium: 126 mmol/L — ABNORMAL LOW (ref 135–145)
Total Bilirubin: 18.9 mg/dL (ref <1.2)
Total Protein: 5.1 g/dL — ABNORMAL LOW (ref 6.5–8.1)

## 2023-01-31 LAB — OSMOLALITY, URINE: Osmolality, Ur: 732 mosm/kg (ref 300–900)

## 2023-01-31 LAB — PROTIME-INR
INR: 1.1 (ref 0.8–1.2)
Prothrombin Time: 14.5 s (ref 11.4–15.2)

## 2023-01-31 LAB — SODIUM, URINE, RANDOM: Sodium, Ur: 12 mmol/L

## 2023-01-31 MED ORDER — MIDAZOLAM HCL 2 MG/2ML IJ SOLN
INTRAMUSCULAR | Status: AC
Start: 2023-01-31 — End: ?
  Filled 2023-01-31: qty 2

## 2023-01-31 MED ORDER — FENTANYL CITRATE (PF) 100 MCG/2ML IJ SOLN
INTRAMUSCULAR | Status: AC | PRN
  Administered 2023-01-31 (×2): 25 ug via INTRAVENOUS
  Administered 2023-01-31 (×2): 50 ug via INTRAVENOUS

## 2023-01-31 MED ORDER — FUROSEMIDE 40 MG PO TABS
40.0000 mg | ORAL_TABLET | Freq: Every day | ORAL | Status: DC
  Administered 2023-01-31 – 2023-02-01 (×2): 40 mg via ORAL
  Filled 2023-01-31 (×2): qty 1

## 2023-01-31 MED ORDER — SODIUM CHLORIDE 0.9 % IV SOLN
INTRAVENOUS | Status: AC | PRN
  Administered 2023-01-31: 2 g via INTRAVENOUS

## 2023-01-31 MED ORDER — LIDOCAINE HCL 1 % IJ SOLN
20.0000 mL | Freq: Once | INTRAMUSCULAR | Status: AC
  Administered 2023-01-31: 10 mL

## 2023-01-31 MED ORDER — LIDOCAINE HCL 1 % IJ SOLN
INTRAMUSCULAR | Status: AC
  Filled 2023-01-31: qty 20

## 2023-01-31 MED ORDER — MIDAZOLAM HCL 2 MG/2ML IJ SOLN
INTRAMUSCULAR | Status: AC | PRN
  Administered 2023-01-31 (×2): .5 mg via INTRAVENOUS
  Administered 2023-01-31: 1 mg via INTRAVENOUS

## 2023-01-31 MED ORDER — IOHEXOL 300 MG/ML  SOLN
50.0000 mL | Freq: Once | INTRAMUSCULAR | Status: AC | PRN
  Administered 2023-01-31: 30 mL

## 2023-01-31 MED ORDER — ENSURE ENLIVE PO LIQD
237.0000 mL | Freq: Two times a day (BID) | ORAL | Status: DC
  Administered 2023-02-01 – 2023-02-13 (×19): 237 mL via ORAL

## 2023-01-31 MED ORDER — SODIUM CHLORIDE 0.9% FLUSH
5.0000 mL | Freq: Three times a day (TID) | INTRAVENOUS | Status: DC
  Administered 2023-01-31 – 2023-02-09 (×26): 5 mL
  Administered 2023-02-09: 15 mL
  Administered 2023-02-10: 5 mL
  Administered 2023-02-10 – 2023-02-11 (×4): 15 mL
  Administered 2023-02-12: 5 mL
  Administered 2023-02-12 (×2): 15 mL
  Administered 2023-02-13 (×2): 5 mL
  Administered 2023-02-13: 15 mL
  Administered 2023-02-14 – 2023-02-24 (×31): 5 mL

## 2023-01-31 MED ORDER — ADULT MULTIVITAMIN W/MINERALS CH
1.0000 | ORAL_TABLET | Freq: Every day | ORAL | Status: DC
  Administered 2023-01-31 – 2023-02-24 (×25): 1 via ORAL
  Filled 2023-01-31 (×25): qty 1

## 2023-01-31 MED ORDER — FENTANYL CITRATE (PF) 100 MCG/2ML IJ SOLN
INTRAMUSCULAR | Status: AC
  Filled 2023-01-31: qty 2

## 2023-01-31 MED ORDER — MORPHINE SULFATE (PF) 2 MG/ML IV SOLN
2.0000 mg | INTRAVENOUS | Status: DC | PRN
  Administered 2023-01-31: 2 mg via INTRAVENOUS
  Filled 2023-01-31: qty 1

## 2023-01-31 MED ORDER — SODIUM CHLORIDE 0.9 % IV SOLN
INTRAVENOUS | Status: AC
  Filled 2023-01-31: qty 2

## 2023-01-31 MED ORDER — IOHEXOL 350 MG/ML SOLN
75.0000 mL | Freq: Once | INTRAVENOUS | Status: AC | PRN
  Administered 2023-01-31: 75 mL via INTRAVENOUS

## 2023-01-31 MED ORDER — IOHEXOL 300 MG/ML  SOLN
30.0000 mL | Freq: Once | INTRAMUSCULAR | Status: AC | PRN
  Administered 2023-01-31: 30 mL via ORAL

## 2023-01-31 NOTE — Progress Notes (Addendum)
Daily Progress Note  DOA: 01/24/2023 Hospital Day: 8  Chief Complaint: Biliary obstruction  ASSESSMENT    Brief Narrative:  Samuel Mayo is a 77 y.o. year old male with a history of  Afib / flutter on Eliquis, mitral valve annuloplasty, HTN, seizure disorder. 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 Underwent percutaneous cholecystomy tube placed. Discharged 01/03/23 on antibiotics. Saw PCP, found to have worsening jaundice. Went for drain study 11/22 and there was non-visualization of CBD so readmitted on 11/22. GI saw on 11/23 for jaundice. 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 Drain study on 11/22 by IR showed persistent occlusion of cystic duct and no opacification of CBD. MRCP showed dilated intrahepatic bile duct with new mural thickening of the extrahepatic bile duct, suspicious for cholangitis.  IR performed PTC today with placement of internal/external drain.  Per Dr. 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 CA19-9 is elevated so this could be concerning for cholangiocarcinoma. Will plan for abdominal CT for further evaluation.  If sampling was considered in the future, could consider either brush biopsy from [facility], or a rendezvous for addressing both stones and tumor. However, from a biliary standpoint, would be difficult to be able to do a rendezvous with the papilla located in the duodenal diverticulum.    Hyponatremia   PAF.  Eliquis on hold   Seizure disorder On Depakote  Principal Problem:   Obstructive jaundice Active Problems:   Hypertension   Atrial fibrillation, chronic (HCC)   Seizure (HCC)   Cholecystitis    Acute biliary pancreatitis   PLAN   - Follow up AFP - Follow-up results of CT A/P  Subjective   Patient for went for IR procedure today. Feeling tired afterwards and having some ab pain.   Objective    Recent Labs    01/31/23 0516  WBC 5.0  HGB 9.5*  HCT 27.7*  PLT 205   BMET Recent Labs    01/29/23 0638 01/30/23 0542 01/30/23 1239 01/30/23 1834 01/31/23 0516  NA 127* 122* 126* 127* 126*  K 3.9 4.1  --   --  3.9  CL 97* 93*  --   --  97*  CO2 19* 17*  --   --  21*  GLUCOSE 99 125*  --   --  96  BUN 11 14  --   --  33*  CREATININE 0.76 0.85  --   --  0.79  CALCIUM 8.2* 8.3*  --   --  8.4*   LFT Recent Labs    01/31/23 0516  PROT 5.1*  ALBUMIN 1.9*  AST 150*  ALT 132*  ALKPHOS 443*  BILITOT 18.9*   PT/INR Recent Labs    01/31/23 0516  LABPROT 14.5  INR 1.1     Imaging:  DG C-Arm 1-60 Min-No Report Fluoroscopy was utilized by the requesting physician.  No radiographic  interpretation.      Scheduled inpatient medications:   divalproex  750 mg Oral BID   furosemide  40 mg Oral Daily   gabapentin  300 mg Oral QHS   metoprolol succinate  25 mg Oral Daily   sodium chloride flush  3 mL Intravenous Q12H   sodium chloride flush  5 mL Intracatheter Q8H   urea  15 g Oral BID   Continuous inpatient infusions:   ampicillin-sulbactam (UNASYN) IV 1.5 g (01/31/23 0858)   PRN inpatient medications: acetaminophen **OR** acetaminophen, diclofenac Sodium, polyethylene glycol  Vital signs in last 24 hours: Temp:  [97.5 F (36.4 C)-98.1 F (36.7 C)] 98.1 F (36.7 C) (11/29 0841) Pulse Rate:  [68-81] 73 (11/29 1200) Resp:  [10-23] 15 (11/29 1200) BP: (100-156)/(58-100) 142/90 (11/29 1200) SpO2:  [96 %-100 %] 97 % (11/29 1200) Last BM Date : 01/31/23  Intake/Output Summary (Last 24 hours) at 01/31/2023 1329 Last data filed at 01/31/2023 1248 Gross per 24 hour  Intake --  Output 400 ml  Net -400 ml    Intake/Output from previous day: 11/28  0701 - 11/29 0700 In: -  Out: 50 [Drains:50] Intake/Output this shift: Total I/O In: -  Out: 350 [Urine:350]   Physical Exam:  General: Tired Heart:  Regular rate Pulmonary: Normal respiratory effort Abdomen: Soft, nondistended. Two biliary drains in place, one as a PCT and the other as a PBD. Extremities: No lower extremity edema  Neurologic: Alert and oriented Psych: Pleasant. Cooperative. Insight appears normal.      LOS: 5 days   Imogene Burn, MD 01/31/2023, 1:29 PM

## 2023-01-31 NOTE — Anesthesia Postprocedure Evaluation (Signed)
Anesthesia Post Note  Patient: Samuel Mayo  Procedure(s) Performed: ESOPHAGOGASTRODUODENOSCOPY (EGD)     Patient location during evaluation: PACU Anesthesia Type: General Level of consciousness: awake and alert Pain management: pain level controlled Vital Signs Assessment: post-procedure vital signs reviewed and stable Respiratory status: spontaneous breathing, nonlabored ventilation and respiratory function stable Cardiovascular status: blood pressure returned to baseline and stable Postop Assessment: no apparent nausea or vomiting Anesthetic complications: no   No notable events documented.  Last Vitals:  Vitals:   01/31/23 1200 01/31/23 1333  BP: (!) 142/90 120/78  Pulse: 73 71  Resp: 15   Temp:  36.6 C  SpO2: 97% 99%    Last Pain:  Vitals:   01/31/23 1434  TempSrc:   PainSc: Asleep   Pain Goal: Patients Stated Pain Goal: 0 (01/27/23 2000)                 Lowella Curb

## 2023-01-31 NOTE — Procedures (Signed)
Interventional Radiology Procedure Note  Procedure: Image guided PTC with int/ext drain placement, transhepatic.  43F biliary drain.  Injection of perc chole shows no cystic duct opacification.  Drain was not removed.   Complications: None  EBL: None    Recommendations: - Routine drain care for both the new Int/Ext, as well as the perc chole, with sterile flushes, record output - Both to gravity - routine wound care  Signed,  Yvone Neu. Loreta Ave, DO

## 2023-01-31 NOTE — Progress Notes (Signed)
Subjective:  UOP not well recorded-  sodium up to 127 overnight-  126 this AM-  urine osm was over 700-  due for biliary procedure today -  he is alert and without complaint  Objective Vital signs in last 24 hours: Vitals:   01/30/23 1513 01/30/23 2018 01/31/23 0426 01/31/23 0841  BP: 106/72 106/83 105/70 (!) 106/58  Pulse: 69 72 81 76  Resp: 16 17 17 18   Temp: (!) 97.5 F (36.4 C) 97.8 F (36.6 C) 97.6 F (36.4 C) 98.1 F (36.7 C)  TempSrc: Oral   Oral  SpO2: 100% 100% 99% 99%  Weight:      Height:       Weight change:   Intake/Output Summary (Last 24 hours) at 01/31/2023 0920 Last data filed at 01/31/2023 6644 Gross per 24 hour  Intake --  Output 50 ml  Net -50 ml    Assessment/ Plan: Pt is a 77 y.o. yo male with HTN, sz, gout who was admitted on 01/24/2023 with  jaundice and hyponatremia   Assessment/Plan: 1. Hyponatremia-  work up reveals SIADH which can be caused by depakote.  BUT-  he has been on depakote for years so maybe this recent history of not taking much food in and much water ( tea and toast) has exacerbated the issue.  He has edema so will add on some lasix-  tells me he is eating more than he has of late-  had a good dinner last night.  No need for Samsca at this time -   can keep urea going -  will also order AM cortisol-  TSH is OK   2. Biliary obstruction-  for procedure today per IR  3. HTN/volume-  actually does seem a bit overloaded so lasix will help this as well however, is third spacing with dec albumin  4. Anemia-  situational -  follow and support   Cecille Aver    Labs: Basic Metabolic Panel: Recent Labs  Lab 01/29/23 0638 01/30/23 0542 01/30/23 1239 01/30/23 1834 01/31/23 0516  NA 127* 122* 126* 127* 126*  K 3.9 4.1  --   --  3.9  CL 97* 93*  --   --  97*  CO2 19* 17*  --   --  21*  GLUCOSE 99 125*  --   --  96  BUN 11 14  --   --  33*  CREATININE 0.76 0.85  --   --  0.79  CALCIUM 8.2* 8.3*  --   --  8.4*   Liver  Function Tests: Recent Labs  Lab 01/29/23 0638 01/30/23 0542 01/31/23 0516  AST 120* 150* 150*  ALT 116* 133* 132*  ALKPHOS 419* 459* 443*  BILITOT 18.3* 18.8* 18.9*  PROT 5.1* 5.5* 5.1*  ALBUMIN 1.9* 2.1* 1.9*   Recent Labs  Lab 01/24/23 1020 01/26/23 0627  LIPASE 1,228* 737*   No results for input(s): "AMMONIA" in the last 168 hours. CBC: Recent Labs  Lab 01/24/23 1020 01/25/23 0548 01/27/23 0700 01/31/23 0516  WBC 5.0 4.6 3.9* 5.0  NEUTROABS 3.1  --  1.9  --   HGB 10.3* 8.9* 9.6* 9.5*  HCT 29.8* 25.5* 27.4* 27.7*  MCV 96.1 95.5 96.8 98.2  PLT 189 180 163 205   Cardiac Enzymes: No results for input(s): "CKTOTAL", "CKMB", "CKMBINDEX", "TROPONINI" in the last 168 hours. CBG: No results for input(s): "GLUCAP" in the last 168 hours.  Iron Studies: No results for input(s): "IRON", "TIBC", "TRANSFERRIN", "FERRITIN"  in the last 72 hours. Studies/Results: DG C-Arm 1-60 Min-No Report  Result Date: 01/29/2023 Fluoroscopy was utilized by the requesting physician.  No radiographic interpretation.   Medications: Infusions:  ampicillin-sulbactam (UNASYN) IV 1.5 g (01/31/23 0858)    Scheduled Medications:  divalproex  750 mg Oral BID   gabapentin  300 mg Oral QHS   metoprolol succinate  25 mg Oral Daily   sodium chloride flush  3 mL Intravenous Q12H   urea  15 g Oral BID    have reviewed scheduled and prn medications.  Physical Exam: General: alert, oriented, jaundiced Heart: RRR Lungs: clear Abdomen: soft, non tender Extremities: trace to 1+ edema     01/31/2023,9:20 AM  LOS: 5 days

## 2023-01-31 NOTE — Progress Notes (Signed)
Initial Nutrition Assessment  DOCUMENTATION CODES:   Non-severe (moderate) malnutrition in context of acute illness/injury  INTERVENTION:   - Provide Ensure Enlive BID, each supplement provides 350 kcal and 20 grams of protein. -Provide snacks - Provide MVI with minerals daily - Promote good PO intake on regular diet -Recommend updated weight   NUTRITION DIAGNOSIS:   Moderate Malnutrition related to acute illness as evidenced by mild muscle depletion, mild fat depletion.  GOAL:   Patient will meet greater than or equal to 90% of their needs   MONITOR:   PO intake, Supplement acceptance, Labs  REASON FOR ASSESSMENT:   NPO/Clear Liquid Diet    ASSESSMENT:  Patient was recently admitted from 10/28 -11/1 for cholecystitis, diagnosed with Mirizzi syndrome ,underwent cholecystostomy. Readmitted 11/22 due to occlusion of the cystic duct and worsening jaundice. PMH of HTN, Afib, gout, congential mitral valve disease  10/31 - cholecystostomy tube placement  11/22- Readmitted, NPO 11/23 - Clears -> Heart healthy 11/24 - NPO 11/27 - Clears 11/28 - Regular 11/29 - PTC with biliary drain placement, NPO  Pt just received a dose of morphine before visit and was very wary. Son was present but said his wife Samuel Mayo is his main caretaker. Pt states he was eating 3 small meals/day before admission in October. From time of discharge (11/1) to current admission pt states having decreased appetite. Suspect on follow up pt or wife will be bale to provide more information.  His son endorses wt loss but pt state she usually weighs around 190 lbs. Per weight documentation pt's weight has been trending down. He has lost 12 lbs, 6% wt loss in 8 months, this is not clinically significant but is concerning given overall clinical picture.  Of note last weight was 11/22, recommend updated weight.    Since admission pt has had very little intake. Pt just started on regular diet today after procedure.  Pt is tired of hospital food but, is willing to try Ensure and Mighty shakes with meals prefers chocolate or vanilla.   Pt's sodium levels have been trending low, MD started on urea, and lasix to correct.   Admit weight: 86.2 kg Current weight: 86.2 kg   01/24/23 86.2 kg  01/13/23 85.8 kg  01/03/23 91.3 kg  12/23/22 89.4 kg  10/21/22 90.2 kg  08/22/22 89.6 kg  07/22/22 92.1 kg  07/17/22 93.2 kg  05/20/22 92.5 kg  05/07/22 91.6 kg   Average Meal Intake: No meals recorded   Nutritionally Relevant Medications: Scheduled Meds:  divalproex  750 mg Oral BID   metoprolol succinate  25 mg Oral Daily   Continuous Infusions:  ampicillin-sulbactam (UNASYN) IV 1.5 g (01/31/23 1522)   Labs Reviewed: Sodium 128, BUN 24, Calcium 8.4,   NUTRITION - FOCUSED PHYSICAL EXAM:  Flowsheet Row Most Recent Value  Orbital Region Moderate depletion  Upper Arm Region Moderate depletion  Thoracic and Lumbar Region Moderate depletion  Buccal Region Moderate depletion  Temple Region Mild depletion  Clavicle Bone Region Mild depletion  Clavicle and Acromion Bone Region Mild depletion  Scapular Bone Region Moderate depletion  Dorsal Hand Mild depletion  Patellar Region Moderate depletion  Anterior Thigh Region Moderate depletion  Posterior Calf Region Moderate depletion  Edema (RD Assessment) None  Hair Reviewed  Eyes Reviewed  Mouth Reviewed  Skin Reviewed  Nails Reviewed       Diet Order:   Diet Order             Diet regular Room service  appropriate? Yes; Fluid consistency: Thin  Diet effective now                   EDUCATION NEEDS:   Education needs have been addressed  Skin:  Skin Assessment: Reviewed RN Assessment  Last BM:  01/31/2023: type 4  Height:   Ht Readings from Last 1 Encounters:  01/24/23 5\' 11"  (1.803 m)    Weight:   Wt Readings from Last 1 Encounters:  01/24/23 86.2 kg    Ideal Body Weight:  78.2 kg  BMI:  Body mass index is 26.5  kg/m.  Estimated Nutritional Needs:   Kcal:  1900-2100 kcal  Protein:  90-100 gm  Fluid:  >1.9 L   Elliot Dally, RD Registered Dietitian  See Amion for more information

## 2023-01-31 NOTE — Plan of Care (Signed)

## 2023-01-31 NOTE — Progress Notes (Signed)
PROGRESS NOTE  Samuel Mayo  ZOX:[id number] DOB: [birth year] DOA: 01/24/2023 PCP: [name], [name], MD   Brief Narrative: Patient is a 77 year old male with history of hypertension, atrial fibrillation, seizures, gout, MVR s/p angioplasty who presented with worsening jaundice.  Patient was recently admitted from 10/28 -11/1 for cholecystitis, diagnosed with Mirizzi syndrome ,underwent cholecystostomy.  Follow-up imaging showed persistent occlusion of the cystic duct, elevated liver enzymes so recommended to go to the emergency department.  Patient also  reported decreased appetite, weakness.  On presentation, he was hemodynamically stable.  Lab work showed sodium of 127, lipase of 1228, elevated liver enzymes, bilirubin of 20.  General surgery, GI consulted.  Attempted  ERCP but turned out to be unsuccessful.  IR planning for PTC with biliary drain placement today  Assessment & Plan:  Principal Problem:   Obstructive jaundice Active Problems:   Atrial fibrillation, chronic (HCC)   Hypertension   Seizure (HCC)   Cholecystitis   Acute biliary pancreatitis   Obstructive jaundice: Initially admitted on 10/28 when he presented with jaundice.  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  Underwent percutaneous cholecystomy tube placement on 10/31 and was discharged home with oral antibiotics.  Outpatient labs showed persistently elevated liver enzymes. Drain study on 11/22 by IR showed persistent occlusion of cystic duct and no opacification of CBD.  MRCP showed dilated intrahepatic bile duct with thickening of the extrahepatic bile duct, suspicious for cholangitis.  Continues to have severe elevated bilirubin, alkaline phosphatase, hyponatremia.  General surgery, GI consulted.  Currently on Unasyn. Denies abdomen pain, nausea or vomiting today.  Remains intensely jaundiced. Attempted  ERCP on 11/27  but turned out to be unsuccessful.  IR planning for PTC with  biliary drain placement today  Hyponatremia: Baseline sodium runs low in the range of 120-132.  Sodium dropped to the range of 122, prompting nephrology consult.  Started on urea, Lasix.  Likely SIADH  Hypertension: On amlodipine, Toprol.  Monitor blood pressure  Paroxysmal A-fib: On metoprolol.  Continue to monitor on telemetry.  Eliquis on hold for upcoming IR procedure  Seizure disorder: On Depakote  History of mitral valve regurgitation: Status post annuloplasty.  Follow-up with cardiology as an outpatient          DVT prophylaxis:SCDs Start: 01/24/23 1337     Code Status: Full Code  Family Communication: Partner at bedside on 11/29  Patient status:Inpatient  Patient is from :Home  Anticipated discharge WJ:XBJY  Estimated DC date:after full workup   Consultants: GI, general surgery  Procedures: ERCP  Antimicrobials:  Anti-infectives (From admission, onward)    Start     Dose/Rate Route Frequency Ordered Stop   01/31/23 1058  cefOXitin (MEFOXIN) 2 g in sodium chloride 0.9 % 100 mL IVPB        over 30 Minutes  Continuous PRN 01/31/23 1058     01/31/23 0000  cefOXitin (MEFOXIN) 2 g in sodium chloride 0.9 % 100 mL IVPB  Status:  Discontinued        2 g 200 mL/hr over 30 Minutes Intravenous To Radiology 01/30/23 1014 01/30/23 1015   01/25/23 1600  ampicillin-sulbactam (UNASYN) 1.5 g in sodium chloride 0.9 % 100 mL IVPB        1.5 g 200 mL/hr over 30 Minutes Intravenous Every 6 hours 01/25/23 1525         Subjective: Patient seen and examined at bedside today.  Comfortable.  Denies any abdomen pain, nausea or vomiting.  Remains  jaundiced  Objective: Vitals:   01/30/23 1513 01/30/23 2018 01/31/23 0426 01/31/23 0841  BP: 106/72 106/83 105/70 (!) 106/58  Pulse: 69 72 81 76  Resp: 16 17 17 18   Temp: (!) 97.5 F (36.4 C) 97.8 F (36.6 C) 97.6 F (36.4 C) 98.1 F (36.7 C)  TempSrc: Oral   Oral  SpO2: 100% 100% 99% 99%  Weight:      Height:         Intake/Output Summary (Last 24 hours) at 01/31/2023 1100 Last data filed at 01/31/2023 0650 Gross per 24 hour  Intake --  Output 50 ml  Net -50 ml   Filed Weights   01/24/23 0953  Weight: 86.2 kg    Examination:  General exam: Overall comfortable, not in distress,jaundice HEENT: PERRL Respiratory system:  no wheezes or crackles  Cardiovascular system: S1 & S2 heard, RRR.  Gastrointestinal system: Abdomen is nondistended, soft and nontender. Central nervous system: Alert and oriented Extremities: No edema, no clubbing ,no cyanosis Skin: No rashes, no ulcers,icteric    Data Reviewed: I have personally reviewed following labs and imaging studies  CBC: Recent Labs  Lab 01/25/23 0548 01/27/23 0700 01/31/23 0516  WBC 4.6 3.9* 5.0  NEUTROABS  --  1.9  --   HGB 8.9* 9.6* 9.5*  HCT 25.5* 27.4* 27.7*  MCV 95.5 96.8 98.2  PLT 180 163 205   Basic Metabolic Panel: Recent Labs  Lab 01/27/23 0700 01/28/23 0624 01/29/23 0638 01/30/23 0542 01/30/23 1239 01/30/23 1834 01/31/23 0516  NA 123* 124* 127* 122* 126* 127* 126*  K 4.0 3.8 3.9 4.1  --   --  3.9  CL 95* 96* 97* 93*  --   --  97*  CO2 20* 19* 19* 17*  --   --  21*  GLUCOSE 94 99 99 125*  --   --  96  BUN 11 11 11 14   --   --  33*  CREATININE 0.74 0.76 0.76 0.85  --   --  0.79  CALCIUM 7.8* 8.0* 8.2* 8.3*  --   --  8.4*     No results found for this or any previous visit (from the past 240 hour(s)).   Radiology Studies: DG C-Arm 1-60 Min-No Report  Result Date: 01/29/2023 Fluoroscopy was utilized by the requesting physician.  No radiographic interpretation.    Scheduled Meds:  divalproex  750 mg Oral BID   furosemide  40 mg Oral Daily   gabapentin  300 mg Oral QHS   metoprolol succinate  25 mg Oral Daily   sodium chloride flush  3 mL Intravenous Q12H   urea  15 g Oral BID   Continuous Infusions:  ampicillin-sulbactam (UNASYN) IV 1.5 g (01/31/23 0858)   cefOXitin       LOS: 5 days   Burnadette Pop, MD [facility] P11/29/2024, 11:00 AM

## 2023-02-01 DIAGNOSIS — I4891 Unspecified atrial fibrillation: Secondary | ICD-10-CM | POA: Diagnosis not present

## 2023-02-01 DIAGNOSIS — K831 Obstruction of bile duct: Secondary | ICD-10-CM | POA: Diagnosis not present

## 2023-02-01 DIAGNOSIS — K810 Acute cholecystitis: Secondary | ICD-10-CM | POA: Diagnosis not present

## 2023-02-01 DIAGNOSIS — E871 Hypo-osmolality and hyponatremia: Secondary | ICD-10-CM | POA: Diagnosis not present

## 2023-02-01 LAB — COMPREHENSIVE METABOLIC PANEL
ALT: 156 U/L — ABNORMAL HIGH (ref 0–44)
AST: 177 U/L — ABNORMAL HIGH (ref 15–41)
Albumin: 2.1 g/dL — ABNORMAL LOW (ref 3.5–5.0)
Alkaline Phosphatase: 504 U/L — ABNORMAL HIGH (ref 38–126)
Anion gap: 8 (ref 5–15)
BUN: 25 mg/dL — ABNORMAL HIGH (ref 8–23)
CO2: 26 mmol/L (ref 22–32)
Calcium: 8.6 mg/dL — ABNORMAL LOW (ref 8.9–10.3)
Chloride: 93 mmol/L — ABNORMAL LOW (ref 98–111)
Creatinine, Ser: 0.82 mg/dL (ref 0.61–1.24)
GFR, Estimated: >60 mL/min (ref >60)
Glucose, Bld: 117 mg/dL — ABNORMAL HIGH (ref 70–99)
Potassium: 3.8 mmol/L (ref 3.5–5.1)
Sodium: 127 mmol/L — ABNORMAL LOW (ref 135–145)
Total Bilirubin: 21.9 mg/dL (ref <1.2)
Total Protein: 5.7 g/dL — ABNORMAL LOW (ref 6.5–8.1)

## 2023-02-01 LAB — CORTISOL: Cortisol, Plasma: 25.2 ug/dL

## 2023-02-01 LAB — URINALYSIS, ROUTINE W REFLEX MICROSCOPIC
Glucose, UA: NEGATIVE mg/dL
Hgb urine dipstick: NEGATIVE
Ketones, ur: NEGATIVE mg/dL
Leukocytes,Ua: NEGATIVE
Nitrite: NEGATIVE
Protein, ur: NEGATIVE mg/dL
Specific Gravity, Urine: 1.016 (ref 1.005–1.030)
pH: 6 (ref 5.0–8.0)

## 2023-02-01 LAB — AFP TUMOR MARKER: AFP, Serum, Tumor Marker: 5 ng/mL (ref 0.0–8.4)

## 2023-02-01 LAB — LACTIC ACID, PLASMA: Lactic Acid, Venous: 1.7 mmol/L (ref 0.5–1.9)

## 2023-02-01 LAB — CANCER ANTIGEN 19-9: CA 19-9: 939 U/mL — ABNORMAL HIGH (ref 0–35)

## 2023-02-01 LAB — GLUCOSE, CAPILLARY: Glucose-Capillary: 129 mg/dL — ABNORMAL HIGH (ref 70–99)

## 2023-02-01 MED ORDER — AMOXICILLIN-POT CLAVULANATE 875-125 MG PO TABS
1.0000 | ORAL_TABLET | Freq: Two times a day (BID) | ORAL | Status: DC
  Administered 2023-02-01: 1 via ORAL
  Filled 2023-02-01: qty 1

## 2023-02-01 NOTE — Plan of Care (Signed)

## 2023-02-01 NOTE — Progress Notes (Signed)
   02/01/23 1135  Mobility  Activity Ambulated with assistance in hallway  Level of Assistance Standby assist, set-up cues, supervision of patient - no hands on  Assistive Device Other (Comment) (IV Pole)  Distance Ambulated (ft) 250 ft  Activity Response Tolerated fair  Mobility Referral Yes  $Mobility charge 1 Mobility  Mobility Specialist Start Time (ACUTE ONLY) 1126  Mobility Specialist Stop Time (ACUTE ONLY) 1135  Mobility Specialist Time Calculation (min) (ACUTE ONLY) 9 min   Mobility Specialist: Progress Note  Pt agreeable to mobility session - received in chair. Required SB using IV Pole. C/o abd pain rated 5/10.  Returned to chair with all needs met - call bell within reach. Family present.  Pt not motivated to walk far distance today.  Barnie Mort, BS Mobility Specialist Please contact via SecureChat or Rehab office at [phone].

## 2023-02-01 NOTE — Progress Notes (Signed)
IR drain placed for PTC.  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  Patient is otherwise doing well this morning.  Labs are stable.  Okay for discharge home from a surgery standpoint.  Case reviewed by Dr. Freida Busman, hepatobiliary surgeon, and she feels patient best served at [facility] in [facility] will coordinate a referral as an outpatient with Dr. Eliot Ford assistance.  Patient was made aware of situation and care plan.

## 2023-02-01 NOTE — Progress Notes (Signed)
Daily Progress Note  DOA: 01/24/2023 Hospital Day: 9  Chief Complaint: Biliary obstruction  ASSESSMENT    Brief Narrative:  Samuel Mayo is a 77 y.o. year old male with a history of  Afib / flutter on Eliquis, mitral valve annuloplasty, HTN, seizure disorder. 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 Underwent percutaneous cholecystomy tube placed. Discharged 01/03/23 on antibiotics. Saw PCP, found to have worsening jaundice. Went for drain study 11/22 and there was non-visualization of CBD so readmitted on 11/22. GI saw on 11/23 for jaundice. 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 Drain study on 11/22 by IR showed persistent occlusion of cystic duct and no opacification of CBD. MRCP showed dilated intrahepatic bile duct with new mural thickening of the extrahepatic bile duct, suspicious for cholangitis.  IR performed PTC 11/29 with placement of internal/external drain.  Per Dr. 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 CA19-9 is elevated so this could be concerning for cholangiocarcinoma.  LFTs today are slightly higher than yesterday's, but it appears that patient is putting out a large amount of bilious fluid from his PBD drain.  Thus hopefully by tomorrow his LFTs will start to decrease.  After discussion with our surgical colleagues, they recommend that the patient be seen with [facility] in [facility].   Hyponatremia   PAF.  Eliquis on hold   Seizure disorder On Depakote  Principal Problem:   Obstructive jaundice Active Problems:   Hypertension   Atrial fibrillation, chronic (HCC)   Seizure (HCC)    Cholecystitis   Acute biliary pancreatitis   Malnutrition of moderate degree   PLAN   -Trend LFTs -Will plan for outpatient follow-up with [facility] as coordinated by Dr. Freida Busman with [facility]  Subjective   Patient is still experiencing some gut discomfort, but this appears to be improved since yesterday.  Patient is putting out a lot of bilious fluid from his PBD drain   Objective    Recent Labs    01/31/23 0516  WBC 5.0  HGB 9.5*  HCT 27.7*  PLT 205   BMET Recent Labs    01/31/23 0516 01/31/23 1501 02/01/23 0239  NA 126* 128* 127*  K 3.9 4.1 3.8  CL 97* 95* 93*  CO2 21* 23 26  GLUCOSE 96 108* 117*  BUN 33* 24* 25*  CREATININE 0.79 0.89 0.82  CALCIUM 8.4* 8.4* 8.6*   LFT Recent Labs    02/01/23 0239  PROT 5.7*  ALBUMIN 2.1*  AST 177*  ALT 156*  ALKPHOS 504*  BILITOT 21.9*   PT/INR Recent Labs    01/31/23 0516  LABPROT 14.5  INR 1.1     Imaging:  CT ABDOMEN PELVIS W CONTRAST CLINICAL DATA:  Gallbladder and biliary cancer.  EXAM: CT ABDOMEN AND PELVIS WITH CONTRAST  TECHNIQUE: Multidetector CT imaging of the abdomen and pelvis was performed using the standard protocol following bolus administration of intravenous contrast.  RADIATION DOSE REDUCTION: This exam was performed according to the departmental dose-optimization program which includes automated exposure control, adjustment of the mA and/or kV according to patient size and/or use of iterative reconstruction technique.  CONTRAST:  75mL OMNIPAQUE IOHEXOL 350 MG/ML SOLN  COMPARISON:  MRI abdomen  01/24/2023.  FINDINGS: Lower chest: There is atelectasis in the lung bases with trace bilateral pleural effusions.  Hepatobiliary: Transhepatic percutaneous biliary catheter present with distal tip in the duodenal. There is mild intrahepatic biliary ductal dilatation in the left lobe of the liver with some periportal edema similar to prior MRI given differences  in technique. There is a small amount of pneumobilia and contrast throughout the biliary tree likely related to recent procedure. No focal liver lesions are seen.  Percutaneous cholecystostomy tube present. There some contrast in the gallbladder. The gallbladder is decompressed and there is diffuse wall thickening similar to prior.  Pancreas: Unremarkable. No pancreatic ductal dilatation or surrounding inflammatory changes.  Spleen: Normal in size without focal abnormality.  Adrenals/Urinary Tract: Adrenal glands are unremarkable. Kidneys are normal, without renal calculi, focal lesion, or hydronephrosis. Bladder is unremarkable.  Stomach/Bowel: Stomach is within normal limits. Appendix appears normal. No evidence of bowel wall thickening, distention, or inflammatory changes. There is sigmoid colon diverticulosis.  Vascular/Lymphatic: No significant vascular findings are present. No enlarged abdominal or pelvic lymph nodes.  Reproductive: Prostate gland is enlarged.  Other: There is presacral edema. There is trace free fluid in the right upper quadrant similar to prior. There is no focal abdominal wall hernia.  Musculoskeletal: There are compression deformities of T8, T9, T11, L1 and L5 which are favored as chronic, but age indeterminate. The bones are diffusely osteopenic. There is mild body wall edema.  IMPRESSION: 1. Transhepatic percutaneous biliary catheter present with distal tip in the duodenum. 2. Percutaneous cholecystostomy tube present. Gallbladder is decompressed with diffuse wall thickening similar to prior. 3. Mild intrahepatic biliary ductal dilatation in the left lobe of the liver with periportal edema similar to prior MRI. 4. Trace free fluid in the right upper quadrant similar to prior. 5. Trace bilateral pleural effusions. 6. Mild body wall edema. 7. Multiple compression deformities of the thoracic and lumbar spine are favored as chronic, but age  indeterminate. Correlate clinically.  Electronically Signed   By: Darliss Cheney M.D.   On: 01/31/2023 20:37 IR BILIARY DRAIN PLACEMENT WITH CHOLANGIOGRAM INDICATION: 77 year old male with biliary obstruction, presents for percutaneous transhepatic cholangiogram and internal/external drain placement  EXAM: IMAGE GUIDED PERCUTANEOUS TRANSHEPATIC CHOLANGIOGRAM  INTERNAL/EXTERNAL BILIARY DRAIN PLACEMENT  MEDICATIONS: 2 g Mefoxin; The antibiotic was administered within an appropriate time frame prior to the initiation of the procedure.  ANESTHESIA/SEDATION: Moderate (conscious) sedation was employed during this procedure. A total of Versed 2 mg and Fentanyl 150 mcg was administered intravenously by the radiology nurse.  Total intra-service moderate Sedation Time: 43 minutes. The patient's level of consciousness and vital signs were monitored continuously by radiology nursing throughout the procedure under my direct supervision.  FLUOROSCOPY: Radiation Exposure Index (as provided by the fluoroscopic device): 282 mGy Kerma  COMPLICATIONS: None  PROCEDURE: The procedure, risks, benefits, and alternatives were explained to the patient and the patient's family. A complete informed consent was performed, with risk benefit analysis. Specific risks that were discussed for the procedure include bleeding, infection, biliary sepsis, IC use day, organ injury, need for further procedure, need for further surgery, long-term drain placement, cardiopulmonary collapse, death. Questions regarding the procedure were encouraged and answered. The patient understands and consents to the procedure.  Patient is position in supine position on the fluoroscopy table, and the upper abdomen was prepped and draped in the usual sterile fashion. Maximum barrier sterile technique with sterile gowns and gloves were used for the procedure. A timeout was performed prior to  the initiation of the procedure.  Local anesthesia was provided with 1% lidocaine with epinephrine.  Ultrasound survey of the left liver lobe was performed, with then ultrasound of the right liver lobe. We elected to access transhepatic biliary system via the right.  1% lidocaine was used for local anesthesia, with generous infiltration of the skin and subcutaneous tissues in and inter left costal location. A Chiba needle was advanced under ultrasound guidance into the right liver lobe, targeting biliary system.  Once the tip of the needle was confirmed within the biliary system by injecting small aliquots of contrast, images were stored of the biliary system after partially opacifying the biliary tree via the needle.  An 018 wire was then advanced centrally. The needle was removed, a small incision was made with an 11 blade scalpel, and then a triaxial Bard system was advanced into the biliary system.  The metal stiffener and dilator were removed, we confirmed placement with contrast infusion.  We then attempted to navigate through stenotic ductal system at the hilum, presumably the common hepatic duct. A coaxial Glidewire and 4 French glide cath were then used to attempt to navigate across the obstruction at the hilum of the liver. A combination of the glide cath, Glidewire, and a roadrunner wire were required to access the common hepatic duct. Once the catheter and wire were advanced into the external hepatic ducts, further images were acquired. This demonstrated that the duodenal diverticulum was distorting the anatomy at the ampulla. After some initial attempts fail to cross the ampulla, a coon's wire was placed and then a 6 French 35 cm straight sheath was placed into the common bile duct.  At this point we were able to navigate the angled diagnostic catheter and the Glidewire through the ampulla into the duodenum. With the diagnostic catheter in the second portion the duodenum, the coon's wire was then  placed.  Twelve French dilation of the subcutaneous tissue tracks was performed, and then a 45 [place] biliary drain was placed as an internal/external biliary drain. Small amount of contrast confirmed location.  The patient tolerated the procedure well and remained hemodynamically stable throughout.  No complications were encountered and no significant blood loss was encountered.  FINDINGS: Percutaneous transhepatic cholangiogram demonstrates significant stenotic narrowing in the bile ducts at the hilum of the liver, suspicious for malignancy/Klatskin tumor  IMPRESSION: Status post image guided percutaneous transhepatic cholangiogram with internal/external biliary drain placement.  Injection of the percutaneous cholecystostomy tube demonstrates that the cystic duct remains occluded.  Signed,  Yvone Neu. Miachel Roux, RPVI  [facility]  [facility]  Electronically Signed   By: Gilmer Mor D.O.   On: 01/31/2023 16:46     Scheduled inpatient medications:   amoxicillin-clavulanate  1 tablet Oral Q12H   divalproex  750 mg Oral BID   feeding supplement  237 mL Oral BID BM   furosemide  40 mg Oral Daily   gabapentin  300 mg Oral QHS   metoprolol succinate  25 mg Oral Daily   multivitamin with minerals  1 tablet Oral Daily   sodium chloride flush  3 mL Intravenous Q12H   sodium chloride flush  5 mL Intracatheter Q8H   urea  15 g Oral BID   Continuous inpatient infusions:    PRN inpatient medications: acetaminophen **OR** acetaminophen, diclofenac Sodium, morphine injection, polyethylene glycol  Vital signs in last 24 hours: Temp:  [97.6 F (36.4 C)-98.4 F (36.9 C)] 98.4 F (36.9 C) (11/30  5366) Pulse Rate:  [71-89] 89 (11/30 0835) Resp:  [16-18] 18 (11/30 0835) BP: (125-131)/(77-82) 128/81 (11/30 0835) SpO2:  [97 %-99 %] 98 % (11/30 0835) Last BM Date : 01/31/23  Intake/Output Summary (Last 24 hours) at  02/01/2023 1410 Last data filed at 02/01/2023 0930 Gross per 24 hour  Intake 730 ml  Output 2935 ml  Net -2205 ml    Intake/Output from previous day: 11/29 0701 - 11/30 0700 In: 1215 [P.O.:1200] Out: 3430 [Urine:2475; Drains:955] Intake/Output this shift: Total I/O In: 240 [P.O.:240] Out: 280 [Drains:280]   Physical Exam:  General: Tired Heart:  Regular rate Pulmonary: Normal respiratory effort Abdomen: Soft, nondistended. Two biliary drains in place, one as a PCT and the other as a PBD.  CBD with a large amount of bilious appearing fluid Extremities: No lower extremity edema  Neurologic: Alert and oriented Psych: Pleasant. Cooperative. Insight appears normal.      LOS: 6 days   Imogene Burn, MD 02/01/2023, 2:10 PM

## 2023-02-01 NOTE — Evaluation (Signed)
Physical Therapy Evaluation Patient Details Name: Samuel Mayo MRN: [id number] DOB: 04/09/1945 Today's Date: 02/01/2023  History of Present Illness  77 y.o. male presents to [facility] 01/24/23 with worsening jaundice and follow up imaging from recent admit that showed non-visualization of CBD. MRI/MRCP showed increased mild to moderate intrahepatic bile duct dilation and new mural thickening and enhancement of the extrahepatic bile duct suspicious for cholangitis. IR performed PTC 11/29 with placement of internal/external drain. 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 Recent admit from 10/28 -11/1 for cholecystitis, diagnosed with Mirizzi syndrome ,underwent cholecystostomy. PMHx: seizure disorder on depakote, PAF, HTN, a-fib, acute biliary pancreatitis, cholecystitis, gout   Clinical Impression  Pt in recliner upon arrival with son present and agreeable to PT eval. Prior to admit, pt reports ambulating with no AD. Per pt and son, pt has had decreased strength and balance prior to admission. Pt was ambulating independently with no AD. When pt walks w/ no challenges to balance or cognitive task, pt is supervision with no AD. When performing dynamic balance activities like heel/toe walking, pt requires MinA. Pt was unable to hold single leg balance position >1 second. Pt presents to therapy session with decreased balance and mobility. Pt would benefit from acute skilled PT to address functional impairments. Recommending post-acute HHPT to work on balance and regaining independence with mobility. Acute PT to follow.          If plan is discharge home, recommend the following: A little help with walking and/or transfers;Help with stairs or ramp for entrance;Assist for transportation   Can travel by private vehicle    Yes    Equipment Recommendations None recommended by PT (will continue to assess)     Functional Status Assessment Patient has had a recent decline in their  functional status and demonstrates the ability to make significant improvements in function in a reasonable and predictable amount of time.     Precautions / Restrictions Precautions Precautions: Fall Precaution Comments: biliary tube R UQ Restrictions Weight Bearing Restrictions: No      Mobility  Bed Mobility  General bed mobility comments: in recliner upon arrival, returned to recliner    Transfers Overall transfer level: Needs assistance Equipment used: None Transfers: Sit to/from Stand Sit to Stand: Contact guard assist, Min assist    General transfer comment: MinA for boost up for initial stand from recliner, CGA for second stand    Ambulation/Gait Ambulation/Gait assistance: Min assist, Supervision Gait Distance (Feet): 40 Feet Assistive device: None Gait Pattern/deviations: Step-through pattern, Staggering left, Staggering right       General Gait Details: supervision when ambulating w/ no challenges to balance, MinA to correct LOB when performing heel/toe walking.        Balance Overall balance assessment: Needs assistance Sitting-balance support: No upper extremity supported, Feet supported Sitting balance-Leahy Scale: Good     Standing balance support: No upper extremity supported, During functional activity Standing balance-Leahy Scale: Fair Standing balance comment: able to stand statically w/ no UE support, w/ challenges to balance pt will reach for support      High Level Balance Comments: 4 Stage Balance Assessment: Stage 1: 30 s, Stage 2: 30 sec, Stage 3: 30 sec, Stage 4: 1 sec        Pertinent Vitals/Pain Pain Assessment Pain Assessment: Faces Faces Pain Scale: Hurts little more Pain Location: abdomen Pain Descriptors / Indicators: Aching, Discomfort Pain Intervention(s): Limited activity within patient's tolerance, Monitored during session, Repositioned    Home Living Family/patient  expects to be discharged to:: Private  residence Living Arrangements: Spouse/significant other Available Help at Discharge: Family;Available 24 hours/day Type of Home: House Home Access: Stairs to enter Entrance Stairs-Rails: Can reach both;Left;Right Entrance Stairs-Number of Steps: 2   Home Layout: One level Home Equipment: Grab bars - toilet;Grab bars - tub/shower      Prior Function Prior Level of Function : Independent/Modified Independent    Mobility Comments: independent w/ no AD ADLs Comments: ind     Extremity/Trunk Assessment   Upper Extremity Assessment Upper Extremity Assessment: Overall WFL for tasks assessed    Lower Extremity Assessment Lower Extremity Assessment: Overall WFL for tasks assessed    Cervical / Trunk Assessment Cervical / Trunk Assessment: Normal  Communication   Communication Communication: No apparent difficulties Cueing Techniques: Verbal cues  Cognition Arousal: Alert Behavior During Therapy: WFL for tasks assessed/performed Overall Cognitive Status: Impaired/Different from baseline Area of Impairment: Orientation    Orientation Level: Disoriented to, Time          General Comments General comments (skin integrity, edema, etc.): VSS on RA, pt w/ jaundiced appearance     PT Assessment Patient needs continued PT services  PT Problem List Decreased activity tolerance;Decreased balance;Decreased mobility       PT Treatment Interventions DME instruction;Gait training;Stair training;Functional mobility training;Therapeutic activities;Therapeutic exercise;Balance training;Patient/family education    PT Goals (Current goals can be found in the Care Plan section)  Acute Rehab PT Goals Patient Stated Goal: to work on balance PT Goal Formulation: With patient/family Time For Goal Achievement: 02/15/23 Potential to Achieve Goals: Good    Frequency Min 1X/week        AM-PAC PT "6 Clicks" Mobility  Outcome Measure Help needed turning from your back to your side while  in a flat bed without using bedrails?: A Little Help needed moving from lying on your back to sitting on the side of a flat bed without using bedrails?: A Little Help needed moving to and from a bed to a chair (including a wheelchair)?: A Little Help needed standing up from a chair using your arms (e.g., wheelchair or bedside chair)?: A Little Help needed to walk in hospital room?: A Little Help needed climbing 3-5 steps with a railing? : A Lot 6 Click Score: 17    End of Session   Activity Tolerance: Patient tolerated treatment well Patient left: in chair;with call bell/phone within reach;with family/visitor present Nurse Communication: Mobility status PT Visit Diagnosis: Unsteadiness on feet (R26.81)    Time: 5784-6962 PT Time Calculation (min) (ACUTE ONLY): 14 min   Charges:   PT Evaluation $PT Eval Low Complexity: 1 Low   PT General Charges $$ ACUTE PT VISIT: 1 Visit        Hilton Cork, PT, DPT Secure Chat Preferred  Rehab Office [phone]   Arturo Morton Brion Aliment 02/01/2023, 5:10 PM

## 2023-02-01 NOTE — Progress Notes (Signed)
Subjective:  UOP 2400  sodium pretty stable 127-  s/p biliary procedure-  good drainage but bili still  high  -  he is alert   "gut hurts"   Objective Vital signs in last 24 hours: Vitals:   01/31/23 1600 01/31/23 2004 02/01/23 0353 02/01/23 0835  BP: 131/78 125/82 127/77 128/81  Pulse: 71 76 87 89  Resp:  16 16 18   Temp: 98.1 F (36.7 C) 97.9 F (36.6 C) 97.6 F (36.4 C) 98.4 F (36.9 C)  TempSrc: Oral Oral Oral Oral  SpO2: 99% 99% 97% 98%  Weight:      Height:       Weight change:   Intake/Output Summary (Last 24 hours) at 02/01/2023 0901 Last data filed at 02/01/2023 0457 Gross per 24 hour  Intake 975 ml  Output 3430 ml  Net -2455 ml    Assessment/ Plan: Pt is a 77 y.o. yo male with HTN, sz, gout who was admitted on 01/24/2023 with  jaundice and hyponatremia   Assessment/Plan: 1. Hyponatremia-  has been sort of chronic since August of 24.  work up reveals SIADH which can be caused by depakote.  BUT-  he has been on depakote for years so maybe this recent history of not taking much food in and much water ( tea and toast) has exacerbated the issue.  He has edema - added on some lasix 11/29-  tells me he is eating more than he has of late.  No need for [place] at this time -   can keep urea going -   AM cortisol fine-  TSH is OK .  Sodium at least stable on the good side of his baseline I if can keep it in the high 120's that will be adequate -  126-128 over last 24 hours -  continue lasix.  Sodium would not be a barrier to discahrge  2. Biliary obstruction-  s/p procedure per IR-  better drainage but no change in bili just yet   3. HTN/volume-  actually does seem a bit overloaded so lasix will help this as well however, is third spacing with dec albumin  4. Anemia-  situational -  follow and support - is stable  Cecille Aver    Labs: Basic Metabolic Panel: Recent Labs  Lab 01/31/23 0516 01/31/23 1501 02/01/23 0239  NA 126* 128* 127*  K 3.9 4.1 3.8  CL 97*  95* 93*  CO2 21* 23 26  GLUCOSE 96 108* 117*  BUN 33* 24* 25*  CREATININE 0.79 0.89 0.82  CALCIUM 8.4* 8.4* 8.6*   Liver Function Tests: Recent Labs  Lab 01/30/23 0542 01/31/23 0516 02/01/23 0239  AST 150* 150* 177*  ALT 133* 132* 156*  ALKPHOS 459* 443* 504*  BILITOT 18.8* 18.9* 21.9*  PROT 5.5* 5.1* 5.7*  ALBUMIN 2.1* 1.9* 2.1*   Recent Labs  Lab 01/26/23 0627  LIPASE 737*   No results for input(s): "AMMONIA" in the last 168 hours. CBC: Recent Labs  Lab 01/27/23 0700 01/31/23 0516  WBC 3.9* 5.0  NEUTROABS 1.9  --   HGB 9.6* 9.5*  HCT 27.4* 27.7*  MCV 96.8 98.2  PLT 163 205   Cardiac Enzymes: No results for input(s): "CKTOTAL", "CKMB", "CKMBINDEX", "TROPONINI" in the last 168 hours. CBG: No results for input(s): "GLUCAP" in the last 168 hours.  Iron Studies: No results for input(s): "IRON", "TIBC", "TRANSFERRIN", "FERRITIN" in the last 72 hours. Studies/Results: CT ABDOMEN PELVIS W CONTRAST  Result Date: 01/31/2023  CLINICAL DATA:  Gallbladder and biliary cancer. EXAM: CT ABDOMEN AND PELVIS WITH CONTRAST TECHNIQUE: Multidetector CT imaging of the abdomen and pelvis was performed using the standard protocol following bolus administration of intravenous contrast. RADIATION DOSE REDUCTION: This exam was performed according to the departmental dose-optimization program which includes automated exposure control, adjustment of the mA and/or kV according to patient size and/or use of iterative reconstruction technique. CONTRAST:  75mL OMNIPAQUE IOHEXOL 350 MG/ML SOLN COMPARISON:  MRI abdomen 01/24/2023. FINDINGS: Lower chest: There is atelectasis in the lung bases with trace bilateral pleural effusions. Hepatobiliary: Transhepatic percutaneous biliary catheter present with distal tip in the duodenal. There is mild intrahepatic biliary ductal dilatation in the left lobe of the liver with some periportal edema similar to prior MRI given differences in technique. There is a  small amount of pneumobilia and contrast throughout the biliary tree likely related to recent procedure. No focal liver lesions are seen. Percutaneous cholecystostomy tube present. There some contrast in the gallbladder. The gallbladder is decompressed and there is diffuse wall thickening similar to prior. Pancreas: Unremarkable. No pancreatic ductal dilatation or surrounding inflammatory changes. Spleen: Normal in size without focal abnormality. Adrenals/Urinary Tract: Adrenal glands are unremarkable. Kidneys are normal, without renal calculi, focal lesion, or hydronephrosis. Bladder is unremarkable. Stomach/Bowel: Stomach is within normal limits. Appendix appears normal. No evidence of bowel wall thickening, distention, or inflammatory changes. There is sigmoid colon diverticulosis. Vascular/Lymphatic: No significant vascular findings are present. No enlarged abdominal or pelvic lymph nodes. Reproductive: Prostate gland is enlarged. Other: There is presacral edema. There is trace free fluid in the right upper quadrant similar to prior. There is no focal abdominal wall hernia. Musculoskeletal: There are compression deformities of T8, T9, T11, L1 and L5 which are favored as chronic, but age indeterminate. The bones are diffusely osteopenic. There is mild body wall edema. IMPRESSION: 1. Transhepatic percutaneous biliary catheter present with distal tip in the duodenum. 2. Percutaneous cholecystostomy tube present. Gallbladder is decompressed with diffuse wall thickening similar to prior. 3. Mild intrahepatic biliary ductal dilatation in the left lobe of the liver with periportal edema similar to prior MRI. 4. Trace free fluid in the right upper quadrant similar to prior. 5. Trace bilateral pleural effusions. 6. Mild body wall edema. 7. Multiple compression deformities of the thoracic and lumbar spine are favored as chronic, but age indeterminate. Correlate clinically. Electronically Signed   By: Darliss Cheney M.D.    On: 01/31/2023 20:37   IR BILIARY DRAIN PLACEMENT WITH CHOLANGIOGRAM  Result Date: 01/31/2023 INDICATION: 77 year old male with biliary obstruction, presents for percutaneous transhepatic cholangiogram and internal/external drain placement EXAM: IMAGE GUIDED PERCUTANEOUS TRANSHEPATIC CHOLANGIOGRAM INTERNAL/EXTERNAL BILIARY DRAIN PLACEMENT MEDICATIONS: 2 g Mefoxin; The antibiotic was administered within an appropriate time frame prior to the initiation of the procedure. ANESTHESIA/SEDATION: Moderate (conscious) sedation was employed during this procedure. A total of Versed 2 mg and Fentanyl 150 mcg was administered intravenously by the radiology nurse. Total intra-service moderate Sedation Time: 43 minutes. The patient's level of consciousness and vital signs were monitored continuously by radiology nursing throughout the procedure under my direct supervision. FLUOROSCOPY: Radiation Exposure Index (as provided by the fluoroscopic device): 282 mGy Kerma COMPLICATIONS: None PROCEDURE: The procedure, risks, benefits, and alternatives were explained to the patient and the patient's family. A complete informed consent was performed, with risk benefit analysis. Specific risks that were discussed for the procedure include bleeding, infection, biliary sepsis, IC use day, organ injury, need for further procedure, need for further surgery,  long-term drain placement, cardiopulmonary collapse, death. Questions regarding the procedure were encouraged and answered. The patient understands and consents to the procedure. Patient is position in supine position on the fluoroscopy table, and the upper abdomen was prepped and draped in the usual sterile fashion. Maximum barrier sterile technique with sterile gowns and gloves were used for the procedure. A timeout was performed prior to the initiation of the procedure. Local anesthesia was provided with 1% lidocaine with epinephrine. Ultrasound survey of the left liver lobe was  performed, with then ultrasound of the right liver lobe. We elected to access transhepatic biliary system via the right. 1% lidocaine was used for local anesthesia, with generous infiltration of the skin and subcutaneous tissues in and inter left costal location. A Chiba needle was advanced under ultrasound guidance into the right liver lobe, targeting biliary system. Once the tip of the needle was confirmed within the biliary system by injecting small aliquots of contrast, images were stored of the biliary system after partially opacifying the biliary tree via the needle. An 018 wire was then advanced centrally. The needle was removed, a small incision was made with an 11 blade scalpel, and then a triaxial Bard system was advanced into the biliary system. The metal stiffener and dilator were removed, we confirmed placement with contrast infusion. We then attempted to navigate through stenotic ductal system at the hilum, presumably the common hepatic duct. A coaxial Glidewire and 4 French glide cath were then used to attempt to navigate across the obstruction at the hilum of the liver. A combination of the glide cath, Glidewire, and a roadrunner wire were required to access the common hepatic duct. Once the catheter and wire were advanced into the external hepatic ducts, further images were acquired. This demonstrated that the duodenal diverticulum was distorting the anatomy at the ampulla. After some initial attempts fail to cross the ampulla, a coon's wire was placed and then a 6 French 35 cm straight sheath was placed into the common bile duct. At this point we were able to navigate the angled diagnostic catheter and the Glidewire through the ampulla into the duodenum. With the diagnostic catheter in the second portion the duodenum, the coon's wire was then placed. Twelve French dilation of the subcutaneous tissue tracks was performed, and then a 5 [place] biliary drain was placed as an internal/external biliary  drain. Small amount of contrast confirmed location. The patient tolerated the procedure well and remained hemodynamically stable throughout. No complications were encountered and no significant blood loss was encountered. FINDINGS: Percutaneous transhepatic cholangiogram demonstrates significant stenotic narrowing in the bile ducts at the hilum of the liver, suspicious for malignancy/[place] tumor IMPRESSION: Status post image guided percutaneous transhepatic cholangiogram with internal/external biliary drain placement. Injection of the percutaneous cholecystostomy tube demonstrates that the cystic duct remains occluded. Signed, Yvone Neu. Miachel Roux, RPVI [facility] Electronically Signed   By: Gilmer Mor D.O.   On: 01/31/2023 16:46   Medications: Infusions:  ampicillin-sulbactam (UNASYN) IV 1.5 g (02/01/23 0451)    Scheduled Medications:  divalproex  750 mg Oral BID   feeding supplement  237 mL Oral BID BM   furosemide  40 mg Oral Daily   gabapentin  300 mg Oral QHS   metoprolol succinate  25 mg Oral Daily   multivitamin with minerals  1 tablet Oral Daily   sodium chloride flush  3 mL Intravenous Q12H   sodium chloride flush  5 mL Intracatheter Q8H  urea  15 g Oral BID    have reviewed scheduled and prn medications.  Physical Exam: General: alert, oriented, jaundiced Heart: RRR Lungs: clear Abdomen: soft, non tender Extremities: trace to 1+ edema     02/01/2023,9:01 AM  LOS: 6 days

## 2023-02-01 NOTE — Progress Notes (Signed)
Referring Physician(s): Dr Angelia Mould  Supervising Physician: Simonne Come  Patient Status:  [facility] - In-pt  Chief Complaint:  Obstructive jaundice   Subjective:  Perc chole drain placed in IR 10/31 PTC/drain placed in IR 11/29  IMPRESSION: Status post image guided percutaneous transhepatic cholangiogram with internal/external biliary drain placement. Injection of the percutaneous cholecystostomy tube demonstrates that the cystic duct remains occluded.  Up in chair Eating reg diet Denies N/V Some stomach ache this am OP of Bili drain is Bile-- OP great OP perc chole scant TB 21.9 (18.9)   Allergies: Other  Medications: Prior to Admission medications   Medication Sig Start Date End Date Taking? Authorizing Provider  amLODipine (NORVASC) 5 MG tablet TAKE 1 TABLET (5 MG TOTAL) BY MOUTH DAILY. 01/11/22  Yes [name], [name], MD  Cholecalciferol (VITAMIN D-3 PO) Take 1 capsule by mouth every evening.   Yes [provider]  divalproex (DEPAKOTE) 250 MG DR tablet Take 1 tablet (250 mg total) by mouth 2 (two) times daily. Take in addition to 500 mg twice a day (pt should have a total of 750 mg twice a day) 08/26/22  Yes Micki Riley, MD  divalproex (DEPAKOTE) 500 MG DR tablet Take 1 tablet (500 mg total) by mouth 2 (two) times daily. 05/20/22  Yes Micki Riley, MD  ELIQUIS 5 MG TABS tablet TAKE 1 TABLET BY MOUTH TWICE A DAY 08/07/22  Yes Mealor, Roberts Gaudy, MD  gabapentin (NEURONTIN) 300 MG capsule Take 1 capsule (300 mg total) by mouth at bedtime. 09/16/22  Yes Micki Riley, MD  ibandronate (BONIVA) 150 MG tablet TAKE 1 TABLET BY MOUTH EVERY 30 DAYS. TAKE IN AM WITH FULL GLASS OF WATER ON EMPTY STOMACH AND DON'T TAKE ANYTHING ELSE BY MOUTH OR LIE DOWN FOR THE NEXT 30 MINUTES Patient taking differently: Take 150 mg by mouth See admin instructions. Take 1 tablet (150mg ) once a month around the 20th. 07/22/22  Yes [name], [name], MD  LUTEIN PO Take 1 tablet by mouth  every evening.   Yes [provider]  metoprolol succinate (TOPROL-XL) 25 MG 24 hr tablet TAKE 1 TABLET (25 MG TOTAL) BY MOUTH DAILY. 09/23/22  Yes BranchAlben Spittle, MD  Multiple Vitamin (MULTIVITAMIN WITH MINERALS) TABS tablet Take 1 tablet by mouth every evening.   Yes [provider]  OVER THE COUNTER MEDICATION Take 1 tablet by mouth 2 (two) times daily. Bone Up   Yes [provider]  sodium chloride flush 0.9 % SOLN injection 10 mLs by Intracatheter route as needed. Flush percutaneous cholecystostomy drain with 10 ml normal saline once daily. 01/03/23 02/02/23 Yes Mickie Kay, NP  amoxicillin-clavulanate (AUGMENTIN) 875-125 MG tablet Take 1 tablet by mouth 2 (two) times daily. Patient not taking: Reported on 01/24/2023    [provider]     Vital Signs: BP 127/77 (BP Location: Right Arm)   Pulse 87   Temp 97.6 F (36.4 C) (Oral)   Resp 16   Ht 5\' 11"  (1.803 m)   Wt 190 lb (86.2 kg)   SpO2 97%   BMI 26.50 kg/m   Physical Exam Vitals reviewed.  Skin:    General: Skin is warm.     Comments: Sites of drain are clean and dry NT no bleeding OP Perc chole drain- scant OP Bili drain 1 L since placement TB 21.9 today (18.9)     Imaging: CT ABDOMEN PELVIS W CONTRAST  Result Date: 01/31/2023 CLINICAL DATA:  Gallbladder and biliary cancer. EXAM: CT ABDOMEN AND PELVIS WITH CONTRAST TECHNIQUE: Multidetector CT imaging of the abdomen and pelvis was performed using the standard protocol following bolus administration of intravenous contrast. RADIATION DOSE REDUCTION: This exam was performed according to the departmental dose-optimization program which includes automated exposure control, adjustment of the mA and/or kV according to patient size and/or use of iterative reconstruction technique. CONTRAST:  75mL OMNIPAQUE IOHEXOL 350 MG/ML SOLN COMPARISON:  MRI abdomen 01/24/2023. FINDINGS: Lower chest: There is atelectasis in the lung bases with trace  bilateral pleural effusions. Hepatobiliary: Transhepatic percutaneous biliary catheter present with distal tip in the duodenal. There is mild intrahepatic biliary ductal dilatation in the left lobe of the liver with some periportal edema similar to prior MRI given differences in technique. There is a small amount of pneumobilia and contrast throughout the biliary tree likely related to recent procedure. No focal liver lesions are seen. Percutaneous cholecystostomy tube present. There some contrast in the gallbladder. The gallbladder is decompressed and there is diffuse wall thickening similar to prior. Pancreas: Unremarkable. No pancreatic ductal dilatation or surrounding inflammatory changes. Spleen: Normal in size without focal abnormality. Adrenals/Urinary Tract: Adrenal glands are unremarkable. Kidneys are normal, without renal calculi, focal lesion, or hydronephrosis. Bladder is unremarkable. Stomach/Bowel: Stomach is within normal limits. Appendix appears normal. No evidence of bowel wall thickening, distention, or inflammatory changes. There is sigmoid colon diverticulosis. Vascular/Lymphatic: No significant vascular findings are present. No enlarged abdominal or pelvic lymph nodes. Reproductive: Prostate gland is enlarged. Other: There is presacral edema. There is trace free fluid in the right upper quadrant similar to prior. There is no focal abdominal wall hernia. Musculoskeletal: There are compression deformities of T8, T9, T11, L1 and L5 which are favored as chronic, but age indeterminate. The bones are diffusely osteopenic. There is mild body wall edema. IMPRESSION: 1. Transhepatic percutaneous biliary catheter present with distal tip in the duodenum. 2. Percutaneous cholecystostomy tube present. Gallbladder is decompressed with diffuse wall thickening similar to prior. 3. Mild intrahepatic biliary ductal dilatation in the left lobe of the liver with periportal edema similar to prior MRI. 4. Trace free  fluid in the right upper quadrant similar to prior. 5. Trace bilateral pleural effusions. 6. Mild body wall edema. 7. Multiple compression deformities of the thoracic and lumbar spine are favored as chronic, but age indeterminate. Correlate clinically. Electronically Signed   By: Darliss Cheney M.D.   On: 01/31/2023 20:37   IR BILIARY DRAIN PLACEMENT WITH CHOLANGIOGRAM  Result Date: 01/31/2023 INDICATION: 77 year old male with biliary obstruction, presents for percutaneous transhepatic cholangiogram and internal/external drain placement EXAM: IMAGE GUIDED PERCUTANEOUS TRANSHEPATIC CHOLANGIOGRAM INTERNAL/EXTERNAL BILIARY DRAIN PLACEMENT MEDICATIONS: 2 g Mefoxin; The antibiotic was administered within an appropriate time frame prior to the initiation of the procedure. ANESTHESIA/SEDATION: Moderate (conscious) sedation was employed during this procedure. A total of Versed 2 mg and Fentanyl 150 mcg was administered intravenously by the radiology nurse. Total intra-service moderate Sedation Time: 43 minutes. The patient's level of consciousness and vital signs were monitored continuously by radiology nursing throughout the procedure under my direct supervision. FLUOROSCOPY: Radiation Exposure Index (as provided by the fluoroscopic device): 282 mGy Kerma COMPLICATIONS: None PROCEDURE: The procedure, risks, benefits, and alternatives were explained to the patient and the patient's family. A complete informed consent was performed, with risk benefit analysis. Specific risks that were discussed for the procedure include bleeding, infection, biliary sepsis, IC use day, organ injury, need for further procedure, need for further surgery, long-term drain placement,  cardiopulmonary collapse, death. Questions regarding the procedure were encouraged and answered. The patient understands and consents to the procedure. Patient is position in supine position on the fluoroscopy table, and the upper abdomen was prepped and draped in  the usual sterile fashion. Maximum barrier sterile technique with sterile gowns and gloves were used for the procedure. A timeout was performed prior to the initiation of the procedure. Local anesthesia was provided with 1% lidocaine with epinephrine. Ultrasound survey of the left liver lobe was performed, with then ultrasound of the right liver lobe. We elected to access transhepatic biliary system via the right. 1% lidocaine was used for local anesthesia, with generous infiltration of the skin and subcutaneous tissues in and inter left costal location. A Chiba needle was advanced under ultrasound guidance into the right liver lobe, targeting biliary system. Once the tip of the needle was confirmed within the biliary system by injecting small aliquots of contrast, images were stored of the biliary system after partially opacifying the biliary tree via the needle. An 018 wire was then advanced centrally. The needle was removed, a small incision was made with an 11 blade scalpel, and then a triaxial Bard system was advanced into the biliary system. The metal stiffener and dilator were removed, we confirmed placement with contrast infusion. We then attempted to navigate through stenotic ductal system at the hilum, presumably the common hepatic duct. A coaxial Glidewire and 4 French glide cath were then used to attempt to navigate across the obstruction at the hilum of the liver. A combination of the glide cath, Glidewire, and a roadrunner wire were required to access the common hepatic duct. Once the catheter and wire were advanced into the external hepatic ducts, further images were acquired. This demonstrated that the duodenal diverticulum was distorting the anatomy at the ampulla. After some initial attempts fail to cross the ampulla, a coon's wire was placed and then a 6 French 35 cm straight sheath was placed into the common bile duct. At this point we were able to navigate the angled diagnostic catheter and the  Glidewire through the ampulla into the duodenum. With the diagnostic catheter in the second portion the duodenum, the coon's wire was then placed. Twelve French dilation of the subcutaneous tissue tracks was performed, and then a 6 [place] biliary drain was placed as an internal/external biliary drain. Small amount of contrast confirmed location. The patient tolerated the procedure well and remained hemodynamically stable throughout. No complications were encountered and no significant blood loss was encountered. FINDINGS: Percutaneous transhepatic cholangiogram demonstrates significant stenotic narrowing in the bile ducts at the hilum of the liver, suspicious for malignancy/[place] tumor IMPRESSION: Status post image guided percutaneous transhepatic cholangiogram with internal/external biliary drain placement. Injection of the percutaneous cholecystostomy tube demonstrates that the cystic duct remains occluded. Signed, Yvone Neu. Miachel Roux, RPVI [facility] Electronically Signed   By: Gilmer Mor D.O.   On: 01/31/2023 16:46   DG C-Arm 1-60 Min-No Report  Result Date: 01/29/2023 Fluoroscopy was utilized by the requesting physician.  No radiographic interpretation.    Labs:  CBC: Recent Labs    01/24/23 1020 01/25/23 0548 01/27/23 0700 01/31/23 0516  WBC 5.0 4.6 3.9* 5.0  HGB 10.3* 8.9* 9.6* 9.5*  HCT 29.8* 25.5* 27.4* 27.7*  PLT 189 180 163 205    COAGS: Recent Labs    12/30/22 2151 12/31/22 0705 12/31/22 1747 01/01/23 1154 01/25/23 0548 01/31/23 0516  INR 1.3*  --   --   --  1.2 1.1  APTT 36 34 61* 43*  --   --     BMP: Recent Labs    01/30/23 0542 01/30/23 1239 01/30/23 1834 01/31/23 0516 01/31/23 1501 02/01/23 0239  NA 122*   < > 127* 126* 128* 127*  K 4.1  --   --  3.9 4.1 3.8  CL 93*  --   --  97* 95* 93*  CO2 17*  --   --  21* 23 26  GLUCOSE 125*  --   --  96 108* 117*  BUN 14  --   --  33* 24*  25*  CALCIUM 8.3*  --   --  8.4* 8.4* 8.6*  CREATININE 0.85  --   --  0.79 0.89 0.82  GFRNONAA >60  --   --  >60 >60 >60   < > = values in this interval not displayed.    LIVER FUNCTION TESTS: Recent Labs    01/29/23 0638 01/30/23 0542 01/31/23 0516 02/01/23 0239  BILITOT 18.3* 18.8* 18.9* 21.9*  AST 120* 150* 150* 177*  ALT 116* 133* 132* 156*  ALKPHOS 419* 459* 443* 504*  PROT 5.1* 5.5* 5.1* 5.7*  ALBUMIN 1.9* 2.1* 1.9* 2.1*   Drain Location: RUQ Size: Fr size: 10 Fr  Fr size: 12 Fr Date of placement: 10/31 and 11/29  Currently to: Drain collection device: gravity 24 hour output:  Output by Drain (mL) 01/30/23 0701 - 01/30/23 1900 01/30/23 1901 - 01/31/23 0700 01/31/23 0701 - 01/31/23 1900 01/31/23 1901 - 02/01/23 0700 02/01/23 0701 - 02/01/23 0809  Biliary Tube Cook slip-coat 10 Fr. RUQ  50 0 5   Biliary Tube Cook slip-coat 12 Fr. RUQ   650 300     Current examination: Flushes/aspirates easily.  Insertion site unremarkable. Suture and stat lock in place. Dressed appropriately.   Plan: Continue TID flushes with 5 cc NS. Record output Q shift. Dressing changes QD or PRN if soiled.  Call IR APP or on call IR MD if difficulty flushing or sudden change in drain output.   Discharge planning: Please contact IR APP or on call IR MD prior to patient d/c to ensure appropriate follow up plans are in place. IR scheduler will contact patient with date/time of appointment. Patient will need to flush drain QD with 5 cc NS, record output QD, dressing changes every 2-3 days or earlier if soiled.   IR will continue to follow - please call with questions or concerns  Assessment and Plan:  Perc chole drain placed 10/31 PTC/Bili drain placed 11/29 IR will follow TB still high at 21.9 today (18.9) Pt will need OP follow up with IR-- plan per [facility]  Electronically Signed: Robet Leu, PA-C 02/01/2023, 8:09 AM   I spent a total of 15 Minutes at the the patient's bedside AND  on the patient's hospital floor or unit, greater than 50% of which was counseling/coordinating care for perc chole drain/ Biliary drain

## 2023-02-01 NOTE — Progress Notes (Signed)
PROGRESS NOTE  Samuel Mayo  ZOX:[id number] DOB: 02-Jun-1945 DOA: 01/24/2023 PCP: [name], [name], MD   Brief Narrative: Patient is a 77 year old male with history of hypertension, atrial fibrillation, seizures, gout, MVR s/p angioplasty who presented with worsening jaundice.  Patient was recently admitted from 10/28 -11/1 for cholecystitis, diagnosed with Mirizzi syndrome ,underwent cholecystostomy.  Follow-up imaging showed persistent occlusion of the cystic duct, elevated liver enzymes so recommended to go to the emergency department.  Patient also  reported decreased appetite, weakness.  On presentation, he was hemodynamically stable.  Lab work showed sodium of 127, lipase of 1228, elevated liver enzymes, bilirubin of 20.  General surgery, GI consulted.  Attempted  ERCP but turned out to be unsuccessful.  IR did PTC with biliary drain placement on 11/29.  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    He will follow-up with Dr. Allen(hepatobiliary surgery) as an  outpatient.  Dr. Eliot Ford office will arrange outpatient follow-up with [facility].  Waiting for trending down bilirubin before discharge, PT consulted  Assessment & Plan:  Principal Problem:   Obstructive jaundice Active Problems:   Atrial fibrillation, chronic (HCC)   Hypertension   Seizure (HCC)   Cholecystitis   Acute biliary pancreatitis   Malnutrition of moderate degree   Obstructive jaundice: Initially admitted on 10/28 when he presented with jaundice.  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  Underwent percutaneous cholecystomy tube placement on 10/31 and was discharged home with oral antibiotics.  Outpatient labs showed persistently elevated liver enzymes. Drain study on 11/22 by IR showed persistent occlusion of cystic duct and no opacification of CBD.  MRCP showed dilated intrahepatic bile duct with thickening of the extrahepatic bile duct, suspicious for  cholangitis.  Continues to have severe elevated bilirubin, alkaline phosphatase, hyponatremia.  General surgery, GI consulted.  Currently on Unasyn. Denies abdomen pain, nausea or vomiting today.  Remains intensely jaundiced. Attempted  ERCP on 11/27  but turned out to be unsuccessful.   IR did PTC with biliary drain placement on 11/29.  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    He will follow-up with Dr. Allen(hepatobiliary surgery) as an  outpatient.  Dr. Eliot Ford office will arrange outpatient follow-up with [facility].  Waiting for trending down bilirubin before discharge, PT consulted  Hyponatremia: Baseline sodium runs low in the range of 120-132.  Sodium dropped to the range of 122, prompting nephrology consult.  Started on urea, Lasix.  Likely SIADH.  Sodium is stable in the range of 120s.  Hypertension: On amlodipine, Toprol at home.  Monitor blood pressure  Paroxysmal A-fib: On metoprolol.  Continue to monitor on telemetry.  Eliquis at home which we will continue on discharge  Seizure disorder: On Depakote  History of mitral valve regurgitation: Status post annuloplasty.  Follow-up with cardiology as an outpatient     Nutrition Problem: Moderate Malnutrition Etiology: acute illness    DVT prophylaxis:SCDs Start: 01/24/23 1337     Code Status: Full Code  Family Communication: Partner at bedside on 11/30  Patient status:Inpatient  Patient is from :Home  Anticipated discharge WJ:XBJY  Estimated DC date: likely tomorrow, waiting for PT assessment and bilirubin downtrend   Consultants: GI, general surgery  Procedures: ERCP  Antimicrobials:  Anti-infectives (From admission, onward)    Start     Dose/Rate Route Frequency Ordered Stop   01/31/23 1058  cefOXitin (MEFOXIN) 2 g in sodium chloride 0.9 % 100 mL IVPB  over 30 Minutes  Continuous PRN 01/31/23 1058 01/31/23 1058   01/31/23 0000  cefOXitin (MEFOXIN) 2 g in sodium chloride 0.9 % 100 mL  IVPB  Status:  Discontinued        2 g 200 mL/hr over 30 Minutes Intravenous To Radiology 01/30/23 1014 01/30/23 1015   01/25/23 1600  ampicillin-sulbactam (UNASYN) 1.5 g in sodium chloride 0.9 % 100 mL IVPB        1.5 g 200 mL/hr over 30 Minutes Intravenous Every 6 hours 01/25/23 1525         Subjective: Patient seen and examined at bedside today.  Hemodynamically stable.  No nausea, vomiting or abdominal pain.  Remains weak.  On complaints today  Objective: Vitals:   01/31/23 1600 01/31/23 2004 02/01/23 0353 02/01/23 0835  BP: 131/78 125/82 127/77 128/81  Pulse: 71 76 87 89  Resp:  16 16 18   Temp: 98.1 F (36.7 C) 97.9 F (36.6 C) 97.6 F (36.4 C) 98.4 F (36.9 C)  TempSrc: Oral Oral Oral Oral  SpO2: 99% 99% 97% 98%  Weight:      Height:        Intake/Output Summary (Last 24 hours) at 02/01/2023 1120 Last data filed at 02/01/2023 0930 Gross per 24 hour  Intake 975 ml  Output 3410 ml  Net -2435 ml   Filed Weights   01/24/23 0953  Weight: 86.2 kg    Examination:  General exam: Overall comfortable, not in distress, jaundice HEENT: PERRL Respiratory system:  no wheezes or crackles  Cardiovascular system: S1 & S2 heard, RRR.  Gastrointestinal system: Abdomen is nondistended, soft and nontender.  Right upper quadrant drain, biliary drain Central nervous system: Alert and oriented Extremities: No edema, no clubbing ,no cyanosis,icteric Skin: No rashes, no ulcers,no icterus     Data Reviewed: I have personally reviewed following labs and imaging studies  CBC: Recent Labs  Lab 01/27/23 0700 01/31/23 0516  WBC 3.9* 5.0  NEUTROABS 1.9  --   HGB 9.6* 9.5*  HCT 27.4* 27.7*  MCV 96.8 98.2  PLT 163 205   Basic Metabolic Panel: Recent Labs  Lab 01/29/23 0638 01/30/23 0542 01/30/23 1239 01/30/23 1834 01/31/23 0516 01/31/23 1501 02/01/23 0239  NA 127* 122* 126* 127* 126* 128* 127*  K 3.9 4.1  --   --  3.9 4.1 3.8  CL 97* 93*  --   --  97* 95* 93*  CO2  19* 17*  --   --  21* 23 26  GLUCOSE 99 125*  --   --  96 108* 117*  BUN 11 14  --   --  33* 24* 25*  CREATININE 0.76 0.85  --   --  0.79 0.89 0.82  CALCIUM 8.2* 8.3*  --   --  8.4* 8.4* 8.6*     No results found for this or any previous visit (from the past 240 hour(s)).   Radiology Studies: CT ABDOMEN PELVIS W CONTRAST  Result Date: 01/31/2023 CLINICAL DATA:  Gallbladder and biliary cancer. EXAM: CT ABDOMEN AND PELVIS WITH CONTRAST TECHNIQUE: Multidetector CT imaging of the abdomen and pelvis was performed using the standard protocol following bolus administration of intravenous contrast. RADIATION DOSE REDUCTION: This exam was performed according to the departmental dose-optimization program which includes automated exposure control, adjustment of the mA and/or kV according to patient size and/or use of iterative reconstruction technique. CONTRAST:  75mL OMNIPAQUE IOHEXOL 350 MG/ML SOLN COMPARISON:  MRI abdomen 01/24/2023. FINDINGS: Lower chest: There is atelectasis  in the lung bases with trace bilateral pleural effusions. Hepatobiliary: Transhepatic percutaneous biliary catheter present with distal tip in the duodenal. There is mild intrahepatic biliary ductal dilatation in the left lobe of the liver with some periportal edema similar to prior MRI given differences in technique. There is a small amount of pneumobilia and contrast throughout the biliary tree likely related to recent procedure. No focal liver lesions are seen. Percutaneous cholecystostomy tube present. There some contrast in the gallbladder. The gallbladder is decompressed and there is diffuse wall thickening similar to prior. Pancreas: Unremarkable. No pancreatic ductal dilatation or surrounding inflammatory changes. Spleen: Normal in size without focal abnormality. Adrenals/Urinary Tract: Adrenal glands are unremarkable. Kidneys are normal, without renal calculi, focal lesion, or hydronephrosis. Bladder is unremarkable.  Stomach/Bowel: Stomach is within normal limits. Appendix appears normal. No evidence of bowel wall thickening, distention, or inflammatory changes. There is sigmoid colon diverticulosis. Vascular/Lymphatic: No significant vascular findings are present. No enlarged abdominal or pelvic lymph nodes. Reproductive: Prostate gland is enlarged. Other: There is presacral edema. There is trace free fluid in the right upper quadrant similar to prior. There is no focal abdominal wall hernia. Musculoskeletal: There are compression deformities of T8, T9, T11, L1 and L5 which are favored as chronic, but age indeterminate. The bones are diffusely osteopenic. There is mild body wall edema. IMPRESSION: 1. Transhepatic percutaneous biliary catheter present with distal tip in the duodenum. 2. Percutaneous cholecystostomy tube present. Gallbladder is decompressed with diffuse wall thickening similar to prior. 3. Mild intrahepatic biliary ductal dilatation in the left lobe of the liver with periportal edema similar to prior MRI. 4. Trace free fluid in the right upper quadrant similar to prior. 5. Trace bilateral pleural effusions. 6. Mild body wall edema. 7. Multiple compression deformities of the thoracic and lumbar spine are favored as chronic, but age indeterminate. Correlate clinically. Electronically Signed   By: Darliss Cheney M.D.   On: 01/31/2023 20:37   IR BILIARY DRAIN PLACEMENT WITH CHOLANGIOGRAM  Result Date: 01/31/2023 INDICATION: 77 year old male with biliary obstruction, presents for percutaneous transhepatic cholangiogram and internal/external drain placement EXAM: IMAGE GUIDED PERCUTANEOUS TRANSHEPATIC CHOLANGIOGRAM INTERNAL/EXTERNAL BILIARY DRAIN PLACEMENT MEDICATIONS: 2 g Mefoxin; The antibiotic was administered within an appropriate time frame prior to the initiation of the procedure. ANESTHESIA/SEDATION: Moderate (conscious) sedation was employed during this procedure. A total of Versed 2 mg and Fentanyl 150 mcg  was administered intravenously by the radiology nurse. Total intra-service moderate Sedation Time: 43 minutes. The patient's level of consciousness and vital signs were monitored continuously by radiology nursing throughout the procedure under my direct supervision. FLUOROSCOPY: Radiation Exposure Index (as provided by the fluoroscopic device): 282 mGy Kerma COMPLICATIONS: None PROCEDURE: The procedure, risks, benefits, and alternatives were explained to the patient and the patient's family. A complete informed consent was performed, with risk benefit analysis. Specific risks that were discussed for the procedure include bleeding, infection, biliary sepsis, IC use day, organ injury, need for further procedure, need for further surgery, long-term drain placement, cardiopulmonary collapse, death. Questions regarding the procedure were encouraged and answered. The patient understands and consents to the procedure. Patient is position in supine position on the fluoroscopy table, and the upper abdomen was prepped and draped in the usual sterile fashion. Maximum barrier sterile technique with sterile gowns and gloves were used for the procedure. A timeout was performed prior to the initiation of the procedure. Local anesthesia was provided with 1% lidocaine with epinephrine. Ultrasound survey of the left liver lobe was performed,  with then ultrasound of the right liver lobe. We elected to access transhepatic biliary system via the right. 1% lidocaine was used for local anesthesia, with generous infiltration of the skin and subcutaneous tissues in and inter left costal location. A Chiba needle was advanced under ultrasound guidance into the right liver lobe, targeting biliary system. Once the tip of the needle was confirmed within the biliary system by injecting small aliquots of contrast, images were stored of the biliary system after partially opacifying the biliary tree via the needle. An 018 wire was then advanced  centrally. The needle was removed, a small incision was made with an 11 blade scalpel, and then a triaxial Bard system was advanced into the biliary system. The metal stiffener and dilator were removed, we confirmed placement with contrast infusion. We then attempted to navigate through stenotic ductal system at the hilum, presumably the common hepatic duct. A coaxial Glidewire and 4 French glide cath were then used to attempt to navigate across the obstruction at the hilum of the liver. A combination of the glide cath, Glidewire, and a roadrunner wire were required to access the common hepatic duct. Once the catheter and wire were advanced into the external hepatic ducts, further images were acquired. This demonstrated that the duodenal diverticulum was distorting the anatomy at the ampulla. After some initial attempts fail to cross the ampulla, a coon's wire was placed and then a 6 French 35 cm straight sheath was placed into the common bile duct. At this point we were able to navigate the angled diagnostic catheter and the Glidewire through the ampulla into the duodenum. With the diagnostic catheter in the second portion the duodenum, the coon's wire was then placed. Twelve French dilation of the subcutaneous tissue tracks was performed, and then a 69 [place] biliary drain was placed as an internal/external biliary drain. Small amount of contrast confirmed location. The patient tolerated the procedure well and remained hemodynamically stable throughout. No complications were encountered and no significant blood loss was encountered. FINDINGS: Percutaneous transhepatic cholangiogram demonstrates significant stenotic narrowing in the bile ducts at the hilum of the liver, suspicious for malignancy/Klatskin tumor IMPRESSION: Status post image guided percutaneous transhepatic cholangiogram with internal/external biliary drain placement. Injection of the percutaneous cholecystostomy tube demonstrates that the cystic  duct remains occluded. Signed, Yvone Neu. Miachel Roux, RPVI [facility] Electronically Signed   By: Gilmer Mor D.O.   On: 01/31/2023 16:46    Scheduled Meds:  divalproex  750 mg Oral BID   feeding supplement  237 mL Oral BID BM   furosemide  40 mg Oral Daily   gabapentin  300 mg Oral QHS   metoprolol succinate  25 mg Oral Daily   multivitamin with minerals  1 tablet Oral Daily   sodium chloride flush  3 mL Intravenous Q12H   sodium chloride flush  5 mL Intracatheter Q8H   urea  15 g Oral BID   Continuous Infusions:  ampicillin-sulbactam (UNASYN) IV 1.5 g (02/01/23 1020)     LOS: 6 days   Burnadette Pop, MD [facility] P11/30/2024, 11:20 AM

## 2023-02-02 ENCOUNTER — Inpatient Hospital Stay (HOSPITAL_COMMUNITY): Payer: Medicare Other

## 2023-02-02 DIAGNOSIS — K831 Obstruction of bile duct: Secondary | ICD-10-CM | POA: Diagnosis not present

## 2023-02-02 DIAGNOSIS — Z7189 Other specified counseling: Secondary | ICD-10-CM | POA: Diagnosis not present

## 2023-02-02 DIAGNOSIS — I4891 Unspecified atrial fibrillation: Secondary | ICD-10-CM | POA: Diagnosis not present

## 2023-02-02 DIAGNOSIS — E871 Hypo-osmolality and hyponatremia: Secondary | ICD-10-CM | POA: Diagnosis not present

## 2023-02-02 DIAGNOSIS — Z515 Encounter for palliative care: Secondary | ICD-10-CM | POA: Diagnosis not present

## 2023-02-02 DIAGNOSIS — I959 Hypotension, unspecified: Secondary | ICD-10-CM | POA: Diagnosis not present

## 2023-02-02 DIAGNOSIS — Z66 Do not resuscitate: Secondary | ICD-10-CM

## 2023-02-02 DIAGNOSIS — K810 Acute cholecystitis: Secondary | ICD-10-CM | POA: Diagnosis not present

## 2023-02-02 LAB — CBC
HCT: 19.7 % — ABNORMAL LOW (ref 39.0–52.0)
HCT: 31.7 % — ABNORMAL LOW (ref 39.0–52.0)
Hemoglobin: 6.8 g/dL — CL (ref 13.0–17.0)
Hemoglobin: 10.9 g/dL — ABNORMAL LOW (ref 13.0–17.0)
MCH: 34 pg (ref 26.0–34.0)
MCH: 34.5 pg — ABNORMAL HIGH (ref 26.0–34.0)
MCHC: 34.5 g/dL (ref 30.0–36.0)
MCHC: 34.4 g/dL (ref 30.0–36.0)
MCV: 98.5 fL (ref 80.0–100.0)
MCV: 100.3 fL — ABNORMAL HIGH (ref 80.0–100.0)
Platelets: 250 10*3/uL (ref 150–400)
Platelets: 220 10*3/uL (ref 150–400)
RBC: 2 MIL/uL — ABNORMAL LOW (ref 4.22–5.81)
RBC: 3.16 MIL/uL — ABNORMAL LOW (ref 4.22–5.81)
RDW: 20.1 % — ABNORMAL HIGH (ref 11.5–15.5)
RDW: 20.1 % — ABNORMAL HIGH (ref 11.5–15.5)
WBC: 12.7 10*3/uL — ABNORMAL HIGH (ref 4.0–10.5)
WBC: 9.6 10*3/uL (ref 4.0–10.5)
nRBC: 0 % (ref 0.0–0.2)
nRBC: 0 % (ref 0.0–0.2)

## 2023-02-02 LAB — COMPREHENSIVE METABOLIC PANEL
ALT: 116 U/L — ABNORMAL HIGH (ref 0–44)
AST: 105 U/L — ABNORMAL HIGH (ref 15–41)
Albumin: 1.9 g/dL — ABNORMAL LOW (ref 3.5–5.0)
Alkaline Phosphatase: 391 U/L — ABNORMAL HIGH (ref 38–126)
Anion gap: 13 (ref 5–15)
BUN: 25 mg/dL — ABNORMAL HIGH (ref 8–23)
CO2: 22 mmol/L (ref 22–32)
Calcium: 8.4 mg/dL — ABNORMAL LOW (ref 8.9–10.3)
Chloride: 92 mmol/L — ABNORMAL LOW (ref 98–111)
Creatinine, Ser: 1.18 mg/dL (ref 0.61–1.24)
GFR, Estimated: >60 mL/min (ref >60)
Glucose, Bld: 117 mg/dL — ABNORMAL HIGH (ref 70–99)
Potassium: 3.3 mmol/L — ABNORMAL LOW (ref 3.5–5.1)
Sodium: 127 mmol/L — ABNORMAL LOW (ref 135–145)
Total Bilirubin: 18.1 mg/dL (ref <1.2)
Total Protein: 5.2 g/dL — ABNORMAL LOW (ref 6.5–8.1)

## 2023-02-02 LAB — PROCALCITONIN: Procalcitonin: 1.52 ng/mL

## 2023-02-02 LAB — LACTIC ACID, PLASMA: Lactic Acid, Venous: 1.2 mmol/L (ref 0.5–1.9)

## 2023-02-02 LAB — PREPARE RBC (CROSSMATCH)

## 2023-02-02 LAB — AMYLASE: Amylase: 172 U/L — ABNORMAL HIGH (ref 28–100)

## 2023-02-02 LAB — ABO/RH: ABO/RH(D): B POS

## 2023-02-02 LAB — LIPASE, BLOOD: Lipase: 71 U/L — ABNORMAL HIGH (ref 11–51)

## 2023-02-02 MED ORDER — LACTATED RINGERS IV BOLUS
500.0000 mL | Freq: Once | INTRAVENOUS | Status: AC
  Administered 2023-02-02: 500 mL via INTRAVENOUS

## 2023-02-02 MED ORDER — VANCOMYCIN HCL IN DEXTROSE 1-5 GM/200ML-% IV SOLN
1000.0000 mg | Freq: Once | INTRAVENOUS | Status: AC
  Administered 2023-02-02: 1000 mg via INTRAVENOUS
  Filled 2023-02-02 (×2): qty 200

## 2023-02-02 MED ORDER — ALBUMIN HUMAN 5 % IV SOLN
25.0000 g | Freq: Once | INTRAVENOUS | Status: AC
  Administered 2023-02-02: 25 g via INTRAVENOUS
  Filled 2023-02-02 (×2): qty 500

## 2023-02-02 MED ORDER — VANCOMYCIN HCL IN DEXTROSE 1-5 GM/200ML-% IV SOLN
1000.0000 mg | Freq: Once | INTRAVENOUS | Status: AC
  Administered 2023-02-02: 1000 mg via INTRAVENOUS

## 2023-02-02 MED ORDER — VANCOMYCIN HCL 2000 MG/400ML IV SOLN
2000.0000 mg | Freq: Once | INTRAVENOUS | Status: DC
  Filled 2023-02-02: qty 400

## 2023-02-02 MED ORDER — VANCOMYCIN HCL 1.5 G IV SOLR
1500.0000 mg | INTRAVENOUS | Status: DC
  Administered 2023-02-03: 1500 mg via INTRAVENOUS
  Filled 2023-02-02 (×2): qty 30

## 2023-02-02 MED ORDER — VANCOMYCIN HCL IN DEXTROSE 1-5 GM/200ML-% IV SOLN: 1000.0000 mg | Freq: Two times a day (BID) | INTRAVENOUS | Status: DC

## 2023-02-02 MED ORDER — ALBUMIN HUMAN 5 % IV SOLN: 12.5000 g | Freq: Once | INTRAVENOUS | Status: DC

## 2023-02-02 MED ORDER — ACETAMINOPHEN 325 MG PO TABS
650.0000 mg | ORAL_TABLET | Freq: Four times a day (QID) | ORAL | Status: DC | PRN
  Administered 2023-02-02 – 2023-02-07 (×2): 650 mg via ORAL
  Filled 2023-02-02: qty 2

## 2023-02-02 MED ORDER — ACETAMINOPHEN 325 MG PO TABS
ORAL_TABLET | ORAL | Status: AC
  Filled 2023-02-02: qty 2

## 2023-02-02 MED ORDER — SODIUM CHLORIDE 0.9% IV SOLUTION: Freq: Once | INTRAVENOUS | Status: DC

## 2023-02-02 MED ORDER — HYDROMORPHONE HCL 1 MG/ML IJ SOLN
0.5000 mg | INTRAMUSCULAR | Status: DC | PRN
  Administered 2023-02-07 – 2023-02-13 (×5): 0.5 mg via INTRAVENOUS
  Filled 2023-02-02 (×5): qty 0.5

## 2023-02-02 MED ORDER — PIPERACILLIN-TAZOBACTAM 3.375 G IVPB
3.3750 g | Freq: Three times a day (TID) | INTRAVENOUS | Status: AC
  Administered 2023-02-02 – 2023-02-05 (×12): 3.375 g via INTRAVENOUS
  Filled 2023-02-02 (×12): qty 50

## 2023-02-02 MED ORDER — POTASSIUM CHLORIDE CRYS ER 20 MEQ PO TBCR
40.0000 meq | EXTENDED_RELEASE_TABLET | Freq: Once | ORAL | Status: AC
  Administered 2023-02-02: 40 meq via ORAL
  Filled 2023-02-02: qty 2

## 2023-02-02 MED ORDER — SODIUM CHLORIDE 0.9 % IV BOLUS
500.0000 mL | Freq: Once | INTRAVENOUS | Status: AC
  Administered 2023-02-02: 500 mL via INTRAVENOUS

## 2023-02-02 NOTE — Progress Notes (Addendum)
Pt was assessed to have fever with shivers at shift change. Provider was paged if it was okay to give tylenol. Provider okayed tylenol and was administered for the fever. Family at bedside reported that pt seemed progressively weaker and having a new onset delayed speeches. It was reported to this RN that pt have had bowel incontinence earlier as against patient's totally independent status. Upon assessment by this RN, pt needed the assistance of 2 nursing staff to stand up from the chair and assisted to bed. Pt was agreeable to having male primofit on and education was provided with the application. Family also reported that pt had an episode of nausea and vomiting with dinner. Pt thereafter reports no interest in eating or drinking anything upon assessment. Provider on call was notified and orders for urinalysis and lactic acid. Oral fluids was encouraged and patient was able to have about before midnight.

## 2023-02-02 NOTE — Progress Notes (Addendum)
PROGRESS NOTE  Samuel Mayo  WJX:[id number] DOB: 07-Feb-1946 DOA: 01/24/2023 PCP: [name], [name], MD   Brief Narrative: Patient is a 77 year old male with history of hypertension, atrial fibrillation, seizures, gout, MVR s/p angioplasty who presented with worsening jaundice.  Patient was recently admitted from 10/28 -11/1 for cholecystitis, diagnosed with Mirizzi syndrome ,underwent cholecystostomy.  Follow-up imaging showed persistent occlusion of the cystic duct, elevated liver enzymes so recommended to go to the emergency department.  Patient also  reported decreased appetite, weakness.  On presentation, he was hemodynamically stable.  Lab work showed sodium of 127, lipase of 1228, elevated liver enzymes, bilirubin of 20.  General surgery, GI consulted.  Attempted  ERCP but turned out to be unsuccessful.  IR did PTC with biliary drain placement on 11/29.  Patient became febrile last night, hypotensive, tachycardic.  Underlying sepsis suspected.  Critical care , palliaitive care consulted  Assessment & Plan:  Principal Problem:   Obstructive jaundice Active Problems:   Atrial fibrillation, chronic (HCC)   Hypertension   Seizure (HCC)   Cholecystitis   Acute biliary pancreatitis   Malnutrition of moderate degree   Obstructive jaundice: Initially admitted on 10/28 when he presented with jaundice.  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  Underwent percutaneous cholecystomy tube placement on 10/31 and was discharged home with oral antibiotics.  Outpatient labs showed persistently elevated liver enzymes. Drain study on 11/22 by IR showed persistent occlusion of cystic duct and no opacification of CBD.  MRCP showed dilated intrahepatic bile duct with thickening of the extrahepatic bile duct, suspicious for cholangitis.  Continues to have severe elevated bilirubin, alkaline phosphatase, hyponatremia.  General surgery, GI consulted.  Currently on  Unasyn. Denies abdomen pain, nausea or vomiting today.  Remains intensely jaundiced. Attempted  ERCP on 11/27  but turned out to be unsuccessful.   IR did PTC with biliary drain placement on 11/29.  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    He will follow-up with Dr. Allen(hepatobiliary surgery) as an  outpatient.  Dr. Eliot Ford office will arrange outpatient follow-up with [facility].   Sepsis: Became febrile since the night of 11/30, developed leukocytosis.  Lactate level normal.  Patient became lethargic.  Underlying sepsis possible.  CT abdomen/pelvis ordered.  Continue broad spectrum antibiotics.  Culture ordered.  Acute blood loss anemia: Hemoglobin dropped to 6.8 from 9.5 within 2 days.  Status post PTC on 11/29.  Need to rule out underlying Abdominal hematoma or bleeding.  Transfused with a unit of PRBC on 12/1.  Checking CT scan  Hyponatremia: Baseline sodium runs low in the range of 120-132.  Sodium dropped to the range of 122, prompting nephrology consult.  Started on urea, Lasix.  Likely SIADH.  Sodium is stable in the range of 120s.  Lasix discontinued  Hypertension: On amlodipine, Toprol at home.  Currently on hold  Paroxysmal A-fib: On metoprolol.  Continue to monitor on telemetry.  Eliquis on hold  Seizure disorder: On Depakote.  Elevated liver enzymes is not from Depakote but from obstructive liver disease.  History of mitral valve regurgitation: Status post annuloplasty.  Follow-up with cardiology as an outpatient  Goals of care: Palliative care, critical care consulted.  Patient might have underlying biliary tumor which looks like unresectable as per general surgery.  Very poor prognosis.  CODE STATUS changed to DNR.  Might be a candidate for hospice/comfort care.  Family open for discussion     Nutrition Problem: Moderate Malnutrition  Etiology: acute illness    DVT prophylaxis:SCDs Start: 01/24/23 1337     Code Status: Limited: Do not attempt  resuscitation (DNR) -DNR-LIMITED -Do Not Intubate/DNI   Family Communication: Significant other on phone on 12/1  Patient status:Inpatient  Patient is from :Home  Anticipated discharge to:not sure  Estimated DC date: not sure    Consultants: GI, general surgery, PCCM, palliative care  Procedures: ERCP, PTC  Antimicrobials:  Anti-infectives (From admission, onward)    Start     Dose/Rate Route Frequency Ordered Stop   02/03/23 1000  Vancomycin (VANCOCIN) 1,500 mg in sodium chloride 0.9 % 500 mL IVPB        1,500 mg 250 mL/hr over 120 Minutes Intravenous Every 24 hours 02/02/23 1006     02/02/23 2100  vancomycin (VANCOCIN) IVPB 1000 mg/200 mL premix  Status:  Discontinued        1,000 mg 200 mL/hr over 60 Minutes Intravenous Every 12 hours 02/02/23 0811 02/02/23 1006   02/02/23 1100  vancomycin (VANCOCIN) IVPB 1000 mg/200 mL premix       Placed in "Followed by" Linked Group   1,000 mg 200 mL/hr over 60 Minutes Intravenous  Once 02/02/23 0913     02/02/23 1000  vancomycin (VANCOCIN) IVPB 1000 mg/200 mL premix       Placed in "Followed by" Linked Group   1,000 mg 200 mL/hr over 60 Minutes Intravenous  Once 02/02/23 0913     02/02/23 0900  vancomycin (VANCOREADY) IVPB 2000 mg/400 mL  Status:  Discontinued        2,000 mg 200 mL/hr over 120 Minutes Intravenous  Once 02/02/23 0804 02/02/23 0913   02/02/23 0900  piperacillin-tazobactam (ZOSYN) IVPB 3.375 g        3.375 g 12.5 mL/hr over 240 Minutes Intravenous Every 8 hours 02/02/23 0805     02/01/23 2200  amoxicillin-clavulanate (AUGMENTIN) 875-125 MG per tablet 1 tablet  Status:  Discontinued        1 tablet Oral Every 12 hours 02/01/23 1150 02/02/23 0738   01/31/23 1058  cefOXitin (MEFOXIN) 2 g in sodium chloride 0.9 % 100 mL IVPB        over 30 Minutes  Continuous PRN 01/31/23 1058 01/31/23 1058   01/31/23 0000  cefOXitin (MEFOXIN) 2 g in sodium chloride 0.9 % 100 mL IVPB  Status:  Discontinued        2 g 200 mL/hr over 30  Minutes Intravenous To Radiology 01/30/23 1014 01/30/23 1015   01/25/23 1600  ampicillin-sulbactam (UNASYN) 1.5 g in sodium chloride 0.9 % 100 mL IVPB  Status:  Discontinued        1.5 g 200 mL/hr over 30 Minutes Intravenous Every 6 hours 01/25/23 1525 02/01/23 1150       Subjective: Patient seen and examined at bedside today.  Became febrile since last night.  Hypertensive, tachycardic this morning.  Appears lethargic.  Seen immediately at the bedside this morning.  Complains of some abdominal discomfort on the drain site. Long discussion held with the patient and significant other about his prognosis.  Critical care, palliative care consulted  Objective: Vitals:   02/02/23 0506 02/02/23 0735 02/02/23 0840 02/02/23 0845  BP: 115/74 (!) 96/57 90/65 94/64   Pulse: (!) 108 (!) 116 95   Resp: 17 18 18    Temp: 100.1 F (37.8 C) 100.3 F (37.9 C) 99.7 F (37.6 C)   TempSrc: Oral Oral Oral   SpO2: 95% 93% 94%   Weight:  Height:        Intake/Output Summary (Last 24 hours) at 02/02/2023 1036 Last data filed at 02/02/2023 0659 Gross per 24 hour  Intake 735 ml  Output 1200 ml  Net -465 ml   Filed Weights   01/24/23 0953  Weight: 86.2 kg    Examination:  General exam: Weak, lethargic, jaundice HEENT: PERRL Respiratory system:  no wheezes or crackles  Cardiovascular system: Sinus tachycardia Gastrointestinal system: Abdomen is nondistended.2 right-sided drains  Central nervous system: Alert and oriented Extremities: Trace lower extremity edema, no clubbing ,no cyanosis Skin: Jaundice   Data Reviewed: I have personally reviewed following labs and imaging studies  CBC: Recent Labs  Lab 01/27/23 0700 01/31/23 0516 02/02/23 0801  WBC 3.9* 5.0 12.7*  NEUTROABS 1.9  --   --   HGB 9.6* 9.5* 6.8*  HCT 27.4* 27.7* 19.7*  MCV 96.8 98.2 98.5  PLT 163 205 250   Basic Metabolic Panel: Recent Labs  Lab 01/30/23 0542 01/30/23 1239 01/30/23 1834 01/31/23 0516  01/31/23 1501 02/01/23 0239 02/02/23 0801  NA 122*   < > 127* 126* 128* 127* 127*  K 4.1  --   --  3.9 4.1 3.8 3.3*  CL 93*  --   --  97* 95* 93* 92*  CO2 17*  --   --  21* 23 26 22   GLUCOSE 125*  --   --  96 108* 117* 117*  BUN 14  --   --  33* 24* 25* 25*  CREATININE 0.85  --   --  0.79 0.89 0.82 1.18  CALCIUM 8.3*  --   --  8.4* 8.4* 8.6* 8.4*   < > = values in this interval not displayed.     No results found for this or any previous visit (from the past 240 hour(s)).   Radiology Studies: CT ABDOMEN PELVIS W CONTRAST  Result Date: 01/31/2023 CLINICAL DATA:  Gallbladder and biliary cancer. EXAM: CT ABDOMEN AND PELVIS WITH CONTRAST TECHNIQUE: Multidetector CT imaging of the abdomen and pelvis was performed using the standard protocol following bolus administration of intravenous contrast. RADIATION DOSE REDUCTION: This exam was performed according to the departmental dose-optimization program which includes automated exposure control, adjustment of the mA and/or kV according to patient size and/or use of iterative reconstruction technique. CONTRAST:  75mL OMNIPAQUE IOHEXOL 350 MG/ML SOLN COMPARISON:  MRI abdomen 01/24/2023. FINDINGS: Lower chest: There is atelectasis in the lung bases with trace bilateral pleural effusions. Hepatobiliary: Transhepatic percutaneous biliary catheter present with distal tip in the duodenal. There is mild intrahepatic biliary ductal dilatation in the left lobe of the liver with some periportal edema similar to prior MRI given differences in technique. There is a small amount of pneumobilia and contrast throughout the biliary tree likely related to recent procedure. No focal liver lesions are seen. Percutaneous cholecystostomy tube present. There some contrast in the gallbladder. The gallbladder is decompressed and there is diffuse wall thickening similar to prior. Pancreas: Unremarkable. No pancreatic ductal dilatation or surrounding inflammatory changes. Spleen:  Normal in size without focal abnormality. Adrenals/Urinary Tract: Adrenal glands are unremarkable. Kidneys are normal, without renal calculi, focal lesion, or hydronephrosis. Bladder is unremarkable. Stomach/Bowel: Stomach is within normal limits. Appendix appears normal. No evidence of bowel wall thickening, distention, or inflammatory changes. There is sigmoid colon diverticulosis. Vascular/Lymphatic: No significant vascular findings are present. No enlarged abdominal or pelvic lymph nodes. Reproductive: Prostate gland is enlarged. Other: There is presacral edema. There is trace free fluid in  the right upper quadrant similar to prior. There is no focal abdominal wall hernia. Musculoskeletal: There are compression deformities of T8, T9, T11, L1 and L5 which are favored as chronic, but age indeterminate. The bones are diffusely osteopenic. There is mild body wall edema. IMPRESSION: 1. Transhepatic percutaneous biliary catheter present with distal tip in the duodenum. 2. Percutaneous cholecystostomy tube present. Gallbladder is decompressed with diffuse wall thickening similar to prior. 3. Mild intrahepatic biliary ductal dilatation in the left lobe of the liver with periportal edema similar to prior MRI. 4. Trace free fluid in the right upper quadrant similar to prior. 5. Trace bilateral pleural effusions. 6. Mild body wall edema. 7. Multiple compression deformities of the thoracic and lumbar spine are favored as chronic, but age indeterminate. Correlate clinically. Electronically Signed   By: Darliss Cheney M.D.   On: 01/31/2023 20:37   IR BILIARY DRAIN PLACEMENT WITH CHOLANGIOGRAM  Result Date: 01/31/2023 INDICATION: 77 year old male with biliary obstruction, presents for percutaneous transhepatic cholangiogram and internal/external drain placement EXAM: IMAGE GUIDED PERCUTANEOUS TRANSHEPATIC CHOLANGIOGRAM INTERNAL/EXTERNAL BILIARY DRAIN PLACEMENT MEDICATIONS: 2 g Mefoxin; The antibiotic was administered  within an appropriate time frame prior to the initiation of the procedure. ANESTHESIA/SEDATION: Moderate (conscious) sedation was employed during this procedure. A total of Versed 2 mg and Fentanyl 150 mcg was administered intravenously by the radiology nurse. Total intra-service moderate Sedation Time: 43 minutes. The patient's level of consciousness and vital signs were monitored continuously by radiology nursing throughout the procedure under my direct supervision. FLUOROSCOPY: Radiation Exposure Index (as provided by the fluoroscopic device): 282 mGy Kerma COMPLICATIONS: None PROCEDURE: The procedure, risks, benefits, and alternatives were explained to the patient and the patient's family. A complete informed consent was performed, with risk benefit analysis. Specific risks that were discussed for the procedure include bleeding, infection, biliary sepsis, IC use day, organ injury, need for further procedure, need for further surgery, long-term drain placement, cardiopulmonary collapse, death. Questions regarding the procedure were encouraged and answered. The patient understands and consents to the procedure. Patient is position in supine position on the fluoroscopy table, and the upper abdomen was prepped and draped in the usual sterile fashion. Maximum barrier sterile technique with sterile gowns and gloves were used for the procedure. A timeout was performed prior to the initiation of the procedure. Local anesthesia was provided with 1% lidocaine with epinephrine. Ultrasound survey of the left liver lobe was performed, with then ultrasound of the right liver lobe. We elected to access transhepatic biliary system via the right. 1% lidocaine was used for local anesthesia, with generous infiltration of the skin and subcutaneous tissues in and inter left costal location. A Chiba needle was advanced under ultrasound guidance into the right liver lobe, targeting biliary system. Once the tip of the needle was  confirmed within the biliary system by injecting small aliquots of contrast, images were stored of the biliary system after partially opacifying the biliary tree via the needle. An 018 wire was then advanced centrally. The needle was removed, a small incision was made with an 11 blade scalpel, and then a triaxial Bard system was advanced into the biliary system. The metal stiffener and dilator were removed, we confirmed placement with contrast infusion. We then attempted to navigate through stenotic ductal system at the hilum, presumably the common hepatic duct. A coaxial Glidewire and 4 French glide cath were then used to attempt to navigate across the obstruction at the hilum of the liver. A combination of the glide cath, Glidewire,  and a roadrunner wire were required to access the common hepatic duct. Once the catheter and wire were advanced into the external hepatic ducts, further images were acquired. This demonstrated that the duodenal diverticulum was distorting the anatomy at the ampulla. After some initial attempts fail to cross the ampulla, a coon's wire was placed and then a 6 French 35 cm straight sheath was placed into the common bile duct. At this point we were able to navigate the angled diagnostic catheter and the Glidewire through the ampulla into the duodenum. With the diagnostic catheter in the second portion the duodenum, the coon's wire was then placed. Twelve French dilation of the subcutaneous tissue tracks was performed, and then a 12 [place] biliary drain was placed as an internal/external biliary drain. Small amount of contrast confirmed location. The patient tolerated the procedure well and remained hemodynamically stable throughout. No complications were encountered and no significant blood loss was encountered. FINDINGS: Percutaneous transhepatic cholangiogram demonstrates significant stenotic narrowing in the bile ducts at the hilum of the liver, suspicious for malignancy/Klatskin tumor  IMPRESSION: Status post image guided percutaneous transhepatic cholangiogram with internal/external biliary drain placement. Injection of the percutaneous cholecystostomy tube demonstrates that the cystic duct remains occluded. Signed, Yvone Neu. Miachel Roux, RPVI [facility] Radiology Electronically Signed   By: Gilmer Mor D.O.   On: 01/31/2023 16:46    Scheduled Meds:  sodium chloride   Intravenous Once   divalproex  750 mg Oral BID   feeding supplement  237 mL Oral BID BM   gabapentin  300 mg Oral QHS   multivitamin with minerals  1 tablet Oral Daily   potassium chloride  40 mEq Oral Once   sodium chloride flush  3 mL Intravenous Q12H   sodium chloride flush  5 mL Intracatheter Q8H   urea  15 g Oral BID   Continuous Infusions:  albumin human     piperacillin-tazobactam (ZOSYN)  IV 3.375 g (02/02/23 0904)   [START ON 02/03/2023] vancomycin     vancomycin     Followed by   vancomycin       LOS: 7 days   Burnadette Pop, MD [facility] P12/03/2022, 10:36 AM

## 2023-02-02 NOTE — Plan of Care (Signed)

## 2023-02-02 NOTE — Progress Notes (Signed)
Patient again seen and examined at the bedside this afternoon.  He looks pretty lethargic, lying on bed.  Family at bedside.  Currently he is in sinus tachycardia, blood pressure is better than this morning. We had a  a long discussion about his current situation and prognosis.  Family understands the situation.  Current plan is to continue the antibiotics/current management and wait for the outcome.  If he does not improve clinically within 12 -24 hrs, family open for hospice approach/comfort care.

## 2023-02-02 NOTE — Progress Notes (Signed)
Subjective:  UOP at least 650-  had fever, chills overnight-  is also noted to be weaker, less talkative-   there is output of biliary drain but situation is more complicated than originally thought-  possible tumor ?  Talking about transferring to [facility] for further eval-  labs not resulted this am yet-  CCM in seeing pt this AM-  lasix has been dcd  Objective Vital signs in last 24 hours: Vitals:   02/01/23 2148 02/01/23 2316 02/02/23 0506 02/02/23 0735  BP:  127/78 115/74 (!) 96/57  Pulse: (!) 104 68 (!) 108 (!) 116  Resp:  17 17 18   Temp:  98.5 F (36.9 C) 100.1 F (37.8 C) 100.3 F (37.9 C)  TempSrc:   Oral Oral  SpO2: 95% 94% 95% 93%  Weight:      Height:       Weight change:   Intake/Output Summary (Last 24 hours) at 02/02/2023 0827 Last data filed at 02/02/2023 6045 Gross per 24 hour  Intake 975 ml  Output 1200 ml  Net -225 ml    Assessment/ Plan: Pt is a 77 y.o. yo male with HTN, sz, gout who was admitted on 01/24/2023 with  jaundice and hyponatremia   Assessment/Plan: 1. Hyponatremia-  has been sort of chronic since August of 24.  work up reveals SIADH which can be caused by depakote.  BUT-  he has been on depakote for years so maybe this recent history of not taking much food in and much water ( tea and toast) has exacerbated the issue.  He has edema - added on some lasix for urinary dilution-  has been stopped in the setting of possible sepsis-   can keep urea going -   AM cortisol fine-  TSH is OK .  Sodium at least stable on the good side of his baseline I if can keep it in the high 120's that will be adequate -  126-128 over last 24 hours -  continue lasix.  Awaiting result of lab this AM but clearly sodium is not his biggest issue at this time-  will follow up labs and act as needed 2. Biliary obstruction-  s/p procedure per IR-  better drainage but no change in bili just yet -  now possible tumor or infection -   CCM, GI and surgery involved  3. HTN/volume-  actually  does seem a bit overloaded so lasix could help this as well however, is third spacing with dec albumin -  lasix has been stopped this AM 12/1 4. Anemia-  situational -  follow and support - is stable  Cecille Aver    Labs: Basic Metabolic Panel: Recent Labs  Lab 01/31/23 0516 01/31/23 1501 02/01/23 0239  NA 126* 128* 127*  K 3.9 4.1 3.8  CL 97* 95* 93*  CO2 21* 23 26  GLUCOSE 96 108* 117*  BUN 33* 24* 25*  CREATININE 0.79 0.89 0.82  CALCIUM 8.4* 8.4* 8.6*   Liver Function Tests: Recent Labs  Lab 01/30/23 0542 01/31/23 0516 02/01/23 0239  AST 150* 150* 177*  ALT 133* 132* 156*  ALKPHOS 459* 443* 504*  BILITOT 18.8* 18.9* 21.9*  PROT 5.5* 5.1* 5.7*  ALBUMIN 2.1* 1.9* 2.1*   No results for input(s): "LIPASE", "AMYLASE" in the last 168 hours.  No results for input(s): "AMMONIA" in the last 168 hours. CBC: Recent Labs  Lab 01/27/23 0700 01/31/23 0516  WBC 3.9* 5.0  NEUTROABS 1.9  --   HGB  9.6* 9.5*  HCT 27.4* 27.7*  MCV 96.8 98.2  PLT 163 205   Cardiac Enzymes: No results for input(s): "CKTOTAL", "CKMB", "CKMBINDEX", "TROPONINI" in the last 168 hours. CBG: Recent Labs  Lab 02/01/23 2051  GLUCAP 129*    Iron Studies: No results for input(s): "IRON", "TIBC", "TRANSFERRIN", "FERRITIN" in the last 72 hours. Studies/Results: CT ABDOMEN PELVIS W CONTRAST  Result Date: 01/31/2023 CLINICAL DATA:  Gallbladder and biliary cancer. EXAM: CT ABDOMEN AND PELVIS WITH CONTRAST TECHNIQUE: Multidetector CT imaging of the abdomen and pelvis was performed using the standard protocol following bolus administration of intravenous contrast. RADIATION DOSE REDUCTION: This exam was performed according to the departmental dose-optimization program which includes automated exposure control, adjustment of the mA and/or kV according to patient size and/or use of iterative reconstruction technique. CONTRAST:  75mL OMNIPAQUE IOHEXOL 350 MG/ML SOLN COMPARISON:  MRI abdomen  01/24/2023. FINDINGS: Lower chest: There is atelectasis in the lung bases with trace bilateral pleural effusions. Hepatobiliary: Transhepatic percutaneous biliary catheter present with distal tip in the duodenal. There is mild intrahepatic biliary ductal dilatation in the left lobe of the liver with some periportal edema similar to prior MRI given differences in technique. There is a small amount of pneumobilia and contrast throughout the biliary tree likely related to recent procedure. No focal liver lesions are seen. Percutaneous cholecystostomy tube present. There some contrast in the gallbladder. The gallbladder is decompressed and there is diffuse wall thickening similar to prior. Pancreas: Unremarkable. No pancreatic ductal dilatation or surrounding inflammatory changes. Spleen: Normal in size without focal abnormality. Adrenals/Urinary Tract: Adrenal glands are unremarkable. Kidneys are normal, without renal calculi, focal lesion, or hydronephrosis. Bladder is unremarkable. Stomach/Bowel: Stomach is within normal limits. Appendix appears normal. No evidence of bowel wall thickening, distention, or inflammatory changes. There is sigmoid colon diverticulosis. Vascular/Lymphatic: No significant vascular findings are present. No enlarged abdominal or pelvic lymph nodes. Reproductive: Prostate gland is enlarged. Other: There is presacral edema. There is trace free fluid in the right upper quadrant similar to prior. There is no focal abdominal wall hernia. Musculoskeletal: There are compression deformities of T8, T9, T11, L1 and L5 which are favored as chronic, but age indeterminate. The bones are diffusely osteopenic. There is mild body wall edema. IMPRESSION: 1. Transhepatic percutaneous biliary catheter present with distal tip in the duodenum. 2. Percutaneous cholecystostomy tube present. Gallbladder is decompressed with diffuse wall thickening similar to prior. 3. Mild intrahepatic biliary ductal dilatation in  the left lobe of the liver with periportal edema similar to prior MRI. 4. Trace free fluid in the right upper quadrant similar to prior. 5. Trace bilateral pleural effusions. 6. Mild body wall edema. 7. Multiple compression deformities of the thoracic and lumbar spine are favored as chronic, but age indeterminate. Correlate clinically. Electronically Signed   By: Darliss Cheney M.D.   On: 01/31/2023 20:37   IR BILIARY DRAIN PLACEMENT WITH CHOLANGIOGRAM  Result Date: 01/31/2023 INDICATION: 77 year old male with biliary obstruction, presents for percutaneous transhepatic cholangiogram and internal/external drain placement EXAM: IMAGE GUIDED PERCUTANEOUS TRANSHEPATIC CHOLANGIOGRAM INTERNAL/EXTERNAL BILIARY DRAIN PLACEMENT MEDICATIONS: 2 g Mefoxin; The antibiotic was administered within an appropriate time frame prior to the initiation of the procedure. ANESTHESIA/SEDATION: Moderate (conscious) sedation was employed during this procedure. A total of Versed 2 mg and Fentanyl 150 mcg was administered intravenously by the radiology nurse. Total intra-service moderate Sedation Time: 43 minutes. The patient's level of consciousness and vital signs were monitored continuously by radiology nursing throughout the procedure under my  direct supervision. FLUOROSCOPY: Radiation Exposure Index (as provided by the fluoroscopic device): 282 mGy Kerma COMPLICATIONS: None PROCEDURE: The procedure, risks, benefits, and alternatives were explained to the patient and the patient's family. A complete informed consent was performed, with risk benefit analysis. Specific risks that were discussed for the procedure include bleeding, infection, biliary sepsis, IC use day, organ injury, need for further procedure, need for further surgery, long-term drain placement, cardiopulmonary collapse, death. Questions regarding the procedure were encouraged and answered. The patient understands and consents to the procedure. Patient is position in  supine position on the fluoroscopy table, and the upper abdomen was prepped and draped in the usual sterile fashion. Maximum barrier sterile technique with sterile gowns and gloves were used for the procedure. A timeout was performed prior to the initiation of the procedure. Local anesthesia was provided with 1% lidocaine with epinephrine. Ultrasound survey of the left liver lobe was performed, with then ultrasound of the right liver lobe. We elected to access transhepatic biliary system via the right. 1% lidocaine was used for local anesthesia, with generous infiltration of the skin and subcutaneous tissues in and inter left costal location. A Chiba needle was advanced under ultrasound guidance into the right liver lobe, targeting biliary system. Once the tip of the needle was confirmed within the biliary system by injecting small aliquots of contrast, images were stored of the biliary system after partially opacifying the biliary tree via the needle. An 018 wire was then advanced centrally. The needle was removed, a small incision was made with an 11 blade scalpel, and then a triaxial Bard system was advanced into the biliary system. The metal stiffener and dilator were removed, we confirmed placement with contrast infusion. We then attempted to navigate through stenotic ductal system at the hilum, presumably the common hepatic duct. A coaxial Glidewire and 4 French glide cath were then used to attempt to navigate across the obstruction at the hilum of the liver. A combination of the glide cath, Glidewire, and a roadrunner wire were required to access the common hepatic duct. Once the catheter and wire were advanced into the external hepatic ducts, further images were acquired. This demonstrated that the duodenal diverticulum was distorting the anatomy at the ampulla. After some initial attempts fail to cross the ampulla, a coon's wire was placed and then a 6 French 35 cm straight sheath was placed into the common  bile duct. At this point we were able to navigate the angled diagnostic catheter and the Glidewire through the ampulla into the duodenum. With the diagnostic catheter in the second portion the duodenum, the coon's wire was then placed. Twelve French dilation of the subcutaneous tissue tracks was performed, and then a 75 [place] biliary drain was placed as an internal/external biliary drain. Small amount of contrast confirmed location. The patient tolerated the procedure well and remained hemodynamically stable throughout. No complications were encountered and no significant blood loss was encountered. FINDINGS: Percutaneous transhepatic cholangiogram demonstrates significant stenotic narrowing in the bile ducts at the hilum of the liver, suspicious for malignancy/[place] tumor IMPRESSION: Status post image guided percutaneous transhepatic cholangiogram with internal/external biliary drain placement. Injection of the percutaneous cholecystostomy tube demonstrates that the cystic duct remains occluded. Signed, Yvone Neu. Miachel Roux, RPVI [facility] Electronically Signed   By: Gilmer Mor D.O.   On: 01/31/2023 16:46   Medications: Infusions:  piperacillin-tazobactam (ZOSYN)  IV     vancomycin     vancomycin  Scheduled Medications:  divalproex  750 mg Oral BID   feeding supplement  237 mL Oral BID BM   gabapentin  300 mg Oral QHS   metoprolol succinate  25 mg Oral Daily   multivitamin with minerals  1 tablet Oral Daily   sodium chloride flush  3 mL Intravenous Q12H   sodium chloride flush  5 mL Intracatheter Q8H   urea  15 g Oral BID    have reviewed scheduled and prn medications.  Physical Exam: General: less alert today , oriented, jaundiced Heart: RRR Lungs: clear Abdomen: soft, non tender Extremities: trace to 1+ edema     02/02/2023,8:27 AM  LOS: 7 days

## 2023-02-02 NOTE — Consult Note (Signed)
Palliative Care Consult Note                                  Date: 02/02/2023   Patient Name: Samuel Mayo  DOB: [birth year]  MRN: [id number]  Age / Sex: 77 y.o., male  PCP: [name], [name], MD Referring Physician: Burnadette Pop, MD  Reason for Consultation: Establishing goals of care  HPI/Patient Profile: 77 y.o. male  with past medical history of a-fib / flutter on Eliquis, mitral valve annuloplasty, HTN, and seizure disorder. 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 Underwent percutaneous cholecystomy tube placed. Discharged 01/03/23 on antibiotics. Saw PCP, found to have worsening jaundice. Went for drain study 11/22 and there was non-visualization of CBD so readmitted on 11/22. GI saw on 11/23 for jaundice. MRI/MRCP showed increased mild to moderate intrahepatic bile duct dilation and new mural thickening and enhancement of the extrahepatic bile duct suspicious for cholangitis.  Past Medical History:  Diagnosis Date   AK (actinic keratosis)    Allergy    Chicken pox    Compression fracture of L1 lumbar vertebra (HCC)    Compression fracture of thoracic vertebra (HCC)    Hypertension    Lentigines    Follows with DR Culton (derma)   Seizures (HCC)     Subjective:   I have reviewed medical records including EPIC notes, labs and imaging, received an update from attending and nursing, assessed the patient and then met with the patient's family to discuss diagnosis prognosis, GOC, EOL wishes, disposition and options. Patient was present but did not participate in the conversation.  I introduced Palliative Medicine as specialized medical care for people living with serious illness. It focuses on providing relief from symptoms and stress of a serious illness. The goal is to improve quality of life for both the patient and the family.  Today's Discussion: Patient's family (spouse  and children) state the patient was in "good health" until this August when his dermatologist noticed he was jaundiced. He followed up with PCP until October when he was admitted with acute cholecystitis with abscess. Since October he has had decreased appetite. Prior to this admission he was completely independent and active.   The family has a very good understanding of the patient's hospitalization. They share that the patient had a very significant change in function over the last day or two and that he is now reporting pain which was new. They share details from their conversations with providers this morning after the patient became febrile overnight, hypotensive, and tachycardic. They also share that they changed his code status this morning to DNR and scope of care to limited. He would not want to be intubated. They understand the patient is likely septic and that he could have a biliary tumor. Patient faces treatment option decisions, advanced directive decisions, and anticipatory care needs. We discussed the importance of considering his quality of life, overall suffering, what would be acceptable to him in the future while considering these decisions.  The family share the patient has always been very analytical (he worked in Automotive engineer) and that he would likely consider all the risks and benefits thoroughly before making decisions. They are hopeful he will recover from his acute decline to definitively determine the cause of his occlusion. Right now the family would like to continue the current course of care allowing time for outcomes. If he declines they  will likely consider comfort care. His wife is very familiar with hospice as she volunteered at [facility] for years.  Discussed the importance of continued conversation with family and the medical providers regarding overall plan of care and treatment options, ensuring decisions are within the context of the patient's values and  GOCs.  Questions and concerns were addressed. Hard Choices booklet left for review. The family was encouraged to call with questions or concerns. PMT will continue to support holistically.  Review of Systems  Gastrointestinal:  Positive for abdominal distention and abdominal pain.    Objective:   Primary Diagnoses: Present on Admission:  Hypertension  Atrial fibrillation, chronic (HCC)  Obstructive jaundice   Physical Exam Vitals reviewed.  HENT:     Head: Normocephalic and atraumatic.  Cardiovascular:     Rate and Rhythm: Normal rate.  Pulmonary:     Effort: Pulmonary effort is normal.  Abdominal:     General: There is distension.     Tenderness: There is abdominal tenderness.  Skin:    General: Skin is warm and dry.     Coloration: Skin is jaundiced.  Neurological:     Mental Status: He is alert. He is confused.     Comments: Patient's family report patient has some confusion at times and is having delayed responses compared to his usual  Psychiatric:        Behavior: Behavior normal.     Vital Signs:  BP 94/64   Pulse 95   Temp 99.7 F (37.6 C) (Oral)   Resp 18   Ht 5\' 11"  (1.803 m)   Wt 86.2 kg   SpO2 94%   BMI 26.50 kg/m    Advanced Care Planning:   Existing Vynca/ACP Documentation: None  Primary Decision Maker: Patient is the decision maker. His wife Tammy Sours is his decision maker when he is unable to make decisions  Code Status/Advance Care Planning: DNR  Assessment & Plan:   SUMMARY OF RECOMMENDATIONS   DNR/DNI Continue current course of treatment-- time for outcomes Encouraged continued family re: goals of care Continued PMT support  Discussed with: Dr. Renford Dills, Tessie Fass CCM NP, and bedside RN  Time Total: 90 minutes   Thank you for allowing [place] to participate in the care of Zuhair Turrentine [place] PMT will continue to support holistically.   Signed by: Sarina Ser, NP Palliative Medicine Team  Team Phone #  [phone] (Nights/Weekends)  02/02/2023, 1:03 PM

## 2023-02-02 NOTE — Progress Notes (Addendum)
Daily Progress Note  DOA: 01/24/2023 Hospital Day: 10  Chief Complaint: Biliary obstruction  ASSESSMENT    Brief Narrative:  Samuel Mayo is a 77 y.o. year old male with a history of  Afib / flutter on Eliquis, mitral valve annuloplasty, HTN, seizure disorder. 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 Underwent percutaneous cholecystomy tube placed. Discharged 01/03/23 on antibiotics. Saw PCP, found to have worsening jaundice. Went for drain study 11/22 and there was non-visualization of CBD so readmitted on 11/22. GI saw on 11/23 for jaundice. 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 Drain study on 11/22 by IR showed persistent occlusion of cystic duct and no opacification of CBD. MRCP showed dilated intrahepatic bile duct with new mural thickening of the extrahepatic bile duct, suspicious for cholangitis.  IR performed PTC 11/29 with placement of internal/external drain.  Per Dr. 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 CA19-9 is elevated so this could be concerning for cholangiocarcinoma.  LFTs are starting to decrease, but the patient developed a fever yesterday.  Hemoglobin was originally low at 6.8 this morning, but upon recheck has now increased to 10.9.  Thus would suspect that his hemoglobin may actually be stable.  Lipase is not suggestive of worsening pancreatitis.  At this time I do suspect that the patient's fever may be due to cholangitis, though it is interesting that his LFTs are improving.  Patient is being covered with by empiric antibiotic therapy.  Will follow-up blood cultures.  Urinalysis was negative.  Will also check a  chest x-ray to see if there is any other alternative source of infection.  Suspect that the patient's loose stools as of this morning are most likely due to antibiotic therapy.   Hyponatremia   PAF.  Eliquis on hold   Seizure disorder On Depakote  Principal Problem:   Obstructive jaundice Active Problems:   Hypertension   Atrial fibrillation, chronic (HCC)   Seizure (HCC)   Cholecystitis   Acute biliary pancreatitis   Malnutrition of moderate degree   PLAN   -Trend LFTs -Agree with empiric antibiotic therapy -Will order chest x-ray to round out infectious workup -Will follow-up blood cultures -I originally ordered a C dif stool test, but then the nurse told me that the patient had a formed brown stool so I cancelled this test -Consider repeat CT A/P in the coming days if the above infectious workup is unrevealing for a source -Plan was originally for outpatient follow-up with [employer] as coordinated by Dr. Freida Busman with [facility], but this would have to be on hold while we are pursuing sepsis workup and treatment  Subjective   Patient developed a fever overnight and had blood cultures drawn this morning.  Patient denies cough or shortness of breath.  Denies dysuria.  Patient did develop some loose stools this morning.  Denies any signs of bleeding.  He does still have some pain in his epigastric area   Objective    Recent Labs    01/31/23 0516 02/02/23 0801 02/02/23 1117  WBC 5.0 12.7* 9.6  HGB 9.5* 6.8* 10.9*  HCT 27.7* 19.7* 31.7*  PLT 205 250 220   BMET Recent Labs    01/31/23 1501 02/01/23 0239 02/02/23  0801  NA 128* 127* 127*  K 4.1 3.8 3.3*  CL 95* 93* 92*  CO2 23 26 22   GLUCOSE 108* 117* 117*  BUN 24* 25* 25*  CREATININE 0.89 0.82 1.18  CALCIUM 8.4* 8.6* 8.4*   LFT Recent Labs    02/02/23 0801  PROT 5.2*  ALBUMIN 1.9*  AST 105*  ALT 116*  ALKPHOS 391*  BILITOT 18.1*   PT/INR Recent Labs    01/31/23 0516  LABPROT 14.5  INR 1.1      Imaging:  CT ABDOMEN PELVIS W CONTRAST CLINICAL DATA:  Gallbladder and biliary cancer.  EXAM: CT ABDOMEN AND PELVIS WITH CONTRAST  TECHNIQUE: Multidetector CT imaging of the abdomen and pelvis was performed using the standard protocol following bolus administration of intravenous contrast.  RADIATION DOSE REDUCTION: This exam was performed according to the departmental dose-optimization program which includes automated exposure control, adjustment of the mA and/or kV according to patient size and/or use of iterative reconstruction technique.  CONTRAST:  75mL OMNIPAQUE IOHEXOL 350 MG/ML SOLN  COMPARISON:  MRI abdomen 01/24/2023.  FINDINGS: Lower chest: There is atelectasis in the lung bases with trace bilateral pleural effusions.  Hepatobiliary: Transhepatic percutaneous biliary catheter present with distal tip in the duodenal. There is mild intrahepatic biliary ductal dilatation in the left lobe of the liver with some periportal edema similar to prior MRI given differences in technique. There is a small amount of pneumobilia and contrast throughout the biliary tree likely related to recent procedure. No focal liver lesions are seen.  Percutaneous cholecystostomy tube present. There some contrast in the gallbladder. The gallbladder is decompressed and there is diffuse wall thickening similar to prior.  Pancreas: Unremarkable. No pancreatic ductal dilatation or surrounding inflammatory changes.  Spleen: Normal in size without focal abnormality.  Adrenals/Urinary Tract: Adrenal glands are unremarkable. Kidneys are normal, without renal calculi, focal lesion, or hydronephrosis. Bladder is unremarkable.  Stomach/Bowel: Stomach is within normal limits. Appendix appears normal. No evidence of bowel wall thickening, distention, or inflammatory changes. There is sigmoid colon diverticulosis.  Vascular/Lymphatic: No significant vascular findings are present.  No enlarged abdominal or pelvic lymph nodes.  Reproductive: Prostate gland is enlarged.  Other: There is presacral edema. There is trace free fluid in the right upper quadrant similar to prior. There is no focal abdominal wall hernia.  Musculoskeletal: There are compression deformities of T8, T9, T11, L1 and L5 which are favored as chronic, but age indeterminate. The bones are diffusely osteopenic. There is mild body wall edema.  IMPRESSION: 1. Transhepatic percutaneous biliary catheter present with distal tip in the duodenum. 2. Percutaneous cholecystostomy tube present. Gallbladder is decompressed with diffuse wall thickening similar to prior. 3. Mild intrahepatic biliary ductal dilatation in the left lobe of the liver with periportal edema similar to prior MRI. 4. Trace free fluid in the right upper quadrant similar to prior. 5. Trace bilateral pleural effusions. 6. Mild body wall edema. 7. Multiple compression deformities of the thoracic and lumbar spine are favored as chronic, but age indeterminate. Correlate clinically.  Electronically Signed   By: Darliss Cheney M.D.   On: 01/31/2023 20:37 IR BILIARY DRAIN PLACEMENT WITH CHOLANGIOGRAM INDICATION: 77 year old male with biliary obstruction, presents for percutaneous transhepatic cholangiogram and internal/external drain placement  EXAM: IMAGE GUIDED PERCUTANEOUS TRANSHEPATIC CHOLANGIOGRAM  INTERNAL/EXTERNAL BILIARY DRAIN PLACEMENT  MEDICATIONS: 2 g Mefoxin; The antibiotic was administered within an appropriate time frame prior to the initiation of the procedure.  ANESTHESIA/SEDATION: Moderate (conscious) sedation was employed during this procedure.  A total of Versed 2 mg and Fentanyl 150 mcg was administered intravenously by the radiology nurse.  Total intra-service moderate Sedation Time: 43 minutes. The patient's level of consciousness and vital signs were monitored continuously by radiology nursing throughout  the procedure under my direct supervision.  FLUOROSCOPY: Radiation Exposure Index (as provided by the fluoroscopic device): 282 mGy Kerma  COMPLICATIONS: None  PROCEDURE: The procedure, risks, benefits, and alternatives were explained to the patient and the patient's family. A complete informed consent was performed, with risk benefit analysis. Specific risks that were discussed for the procedure include bleeding, infection, biliary sepsis, IC use day, organ injury, need for further procedure, need for further surgery, long-term drain placement, cardiopulmonary collapse, death. Questions regarding the procedure were encouraged and answered. The patient understands and consents to the procedure.  Patient is position in supine position on the fluoroscopy table, and the upper abdomen was prepped and draped in the usual sterile fashion. Maximum barrier sterile technique with sterile gowns and gloves were used for the procedure. A timeout was performed prior to the initiation of the procedure. Local anesthesia was provided with 1% lidocaine with epinephrine.  Ultrasound survey of the left liver lobe was performed, with then ultrasound of the right liver lobe. We elected to access transhepatic biliary system via the right.  1% lidocaine was used for local anesthesia, with generous infiltration of the skin and subcutaneous tissues in and inter left costal location. A Chiba needle was advanced under ultrasound guidance into the right liver lobe, targeting biliary system.  Once the tip of the needle was confirmed within the biliary system by injecting small aliquots of contrast, images were stored of the biliary system after partially opacifying the biliary tree via the needle.  An 018 wire was then advanced centrally. The needle was removed, a small incision was made with an 11 blade scalpel, and then a triaxial Bard system was advanced into the biliary system.  The metal  stiffener and dilator were removed, we confirmed placement with contrast infusion.  We then attempted to navigate through stenotic ductal system at the hilum, presumably the common hepatic duct. A coaxial Glidewire and 4 French glide cath were then used to attempt to navigate across the obstruction at the hilum of the liver. A combination of the glide cath, Glidewire, and a roadrunner wire were required to access the common hepatic duct. Once the catheter and wire were advanced into the external hepatic ducts, further images were acquired. This demonstrated that the duodenal diverticulum was distorting the anatomy at the ampulla. After some initial attempts fail to cross the ampulla, a coon's wire was placed and then a 6 French 35 cm straight sheath was placed into the common bile duct.  At this point we were able to navigate the angled diagnostic catheter and the Glidewire through the ampulla into the duodenum. With the diagnostic catheter in the second portion the duodenum, the coon's wire was then placed.  Twelve French dilation of the subcutaneous tissue tracks was performed, and then a 74 [place] biliary drain was placed as an internal/external biliary drain. Small amount of contrast confirmed location.  The patient tolerated the procedure well and remained hemodynamically stable throughout.  No complications were encountered and no significant blood loss was encountered.  FINDINGS: Percutaneous transhepatic cholangiogram demonstrates significant stenotic narrowing in the bile ducts at the hilum of the liver, suspicious for malignancy/Klatskin tumor  IMPRESSION: Status post image guided percutaneous transhepatic cholangiogram with internal/external biliary drain placement.  Injection  of the percutaneous cholecystostomy tube demonstrates that the cystic duct remains occluded.  Signed,  Yvone Neu. Miachel Roux, RPVI  [facility]  [facility]  Electronically Signed   By: Gilmer Mor D.O.   On: 01/31/2023 16:46     Scheduled inpatient medications:   sodium chloride   Intravenous Once   divalproex  750 mg Oral BID   feeding supplement  237 mL Oral BID BM   multivitamin with minerals  1 tablet Oral Daily   potassium chloride  40 mEq Oral Once   sodium chloride flush  3 mL Intravenous Q12H   sodium chloride flush  5 mL Intracatheter Q8H   urea  15 g Oral BID   Continuous inpatient infusions:   albumin human     piperacillin-tazobactam (ZOSYN)  IV 3.375 g (02/02/23 0904)   [START ON 02/03/2023] vancomycin     vancomycin     Followed by   vancomycin      PRN inpatient medications: diclofenac Sodium, HYDROmorphone (DILAUDID) injection, polyethylene glycol  Vital signs in last 24 hours: Temp:  [98.3 F (36.8 C)-101.3 F (38.5 C)] 99.7 F (37.6 C) (12/01 0840) Pulse Rate:  [68-116] 95 (12/01 0840) Resp:  [17-18] 18 (12/01 0840) BP: (90-127)/(57-81) 94/64 (12/01 0845) SpO2:  [91 %-99 %] 94 % (12/01 0840) Last BM Date : 02/02/23  Intake/Output Summary (Last 24 hours) at 02/02/2023 1153 Last data filed at 02/02/2023 0840 Gross per 24 hour  Intake 495 ml  Output 1400 ml  Net -905 ml    Intake/Output from previous day: 11/30 0701 - 12/01 0700 In: 975 [P.O.:960] Out: 1480 [Urine:650; Drains:830] Intake/Output this shift: Total I/O In: -  Out: 200 [Drains:200]   Physical Exam:  General: Tired Heart:  Regular rate Pulmonary: Normal respiratory effort Abdomen: Soft, nondistended, mildly tender in the epigastric area. Two biliary drains in place, one as a PCT and the other as a PBD.   Extremities: No lower extremity edema  Neurologic: Alert and oriented Psych: Pleasant. Cooperative. Insight appears normal.      LOS: 7 days   Imogene Burn, MD 02/02/2023, 11:53 AM

## 2023-02-02 NOTE — Progress Notes (Signed)
Overnight events noted.  Signs of sepsis.  Primary service consulting I signed care.  Unfortunately, he is drained there is nothing else we can offer him surgically at this point in time.

## 2023-02-02 NOTE — Consult Note (Signed)
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 Admitted to [facility], CCS and GI consulted. MRCP with dilated intrahepatic ducts, new mural thickening of extrahepatic bile duct, non-dilated CBD.  Some suspicion for cholangitis. Multidisciplinary discussion yielded decision to proceed with ERCP, and if not successful attempt PTC. ERCP 11/27 was aborted due to inability to enter bile duct w peridiverticular papilla.  11/28 nephro consulted for hyponatremia, workup c/w SIADH   11/29 PTC with placement pf internal/external drain with IR; concerns for klatskin rumor/biliary tumor.  11/30 CCS rec outpt follow up w [facility] Hepatobiliary service. Starting to have more bilious output from drain.  Overnight fever 12/1 new tachycardia, Bps starting to downtrend.   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 Perc cole tube 10/31. Dc on augmenting 11/22 outpt cholangiogram without visualization of CBD prompting ED presentation. Admit to [facility]. CCS consult. MRCP with increased CBD dilation, new mural thickening and enhancement of extrahepatic bile duct.  11/23 GI consult  11/24-26 interdisciplinary discussions re plan of care-- ultimately ERCP felt best next step 11/27 ERCP unsuccessful, unable to enter bile duct 11/28  nephro consult for hypoNa-- SIADH  11/29 PTC w int/ext drain by IR, concern for klatskin tumor 11/30-12/1 fever, soft BP, tachycardic. Concern for developing sepsis. PCCM consult    Interim History / Subjective:  Consulted   Discussed interval changes w nursing staff and wife at bedside Objective   Blood pressure (!) 96/57, pulse (!) 116, temperature 100.3 F (37.9 C), temperature source Oral, resp. rate 18, height 5\' 11"  (1.803 m), weight 86.2 kg, SpO2 93%.        Intake/Output Summary (Last 24 hours) at 02/02/2023 0801 Last data filed at 02/02/2023 4098 Gross per 24 hour  Intake 975 ml  Output 1480 ml  Net -505 ml   Filed Weights   01/24/23 0953  Weight: 86.2 kg    Examination: General: ill appearing older adult M NAD  Neuro: lethargic, oriented x3 following commands 5/5 strength BUE BLE  HENT: Icteric sclera  Lungs: Even unlabored on RA  Cardiovascular: tachycardic, regular  Abdomen: soft, R sided tenderness. Drain with bilious output  GU: external cath  Extremities: no acute joint deformity. Trace BLE edema  Skin: jaundice. Very warm to touch.   Resolved Hospital Problem list     Assessment & Plan:   Hypotension: Possible sepsis  Possible ABLA  / hypovolemia  -tachy, soft pressures, low grade temp-- Ddx sepsis vs PE (not on chemical vte ppx). no unilateral edema, no sx DVT or PE. Had been ambulatory prior to last night, so clinically I dont favor this.  -ABLA would account for soft Bps, tachy, lethargy weakness as well. As I was in room hgb resulted at 6.8. Has had procedures this admit-- could be r/t this. No melena, hematuria, hematemesis, drain output is not bloody. No externally obvious hematomas.  P -send LA, PCT  -Bcx UA  -Zosyn, vanc  - LR bolus & albumin -Lipase (was instrumented 11/27 ERCP)  -STAT repeat CBC and T&S -- transfuse for goal >7 or hemodynamically significant bleeding.  -consider CT a/p  -dc SCH metop with soft Bps.   -agree w txf  to progressive, doesn't need ICU right now   -no surgical indication per ccs   Biliary obstruction Hyperbilirubinemia Elevated LFTs  -concern for tumor. CA 19-9 elevated.  P -CMP is pending for today, follow  -drain in place -hope is for output follow up w [facility] hepatobiliary team -IR, GI, CCS following here  Hyponatremia -SIADH -- VPA + tea and toast P -nephro following  -urea   Sz disorder -VPA   Hx Afib -takes metop for this. Has been sinus this admit, 12/1 sinus tach + soft pressures P -dc metop for now until pressures are a little better    DNR discussion -DNR/I after conversation w pt/wife 12/1   Best Practice (right click and "Reselect all SmartList Selections" daily)   Diet/type: Regular consistency (see orders) DVT prophylaxis: SCDs Start: 01/24/23 1337   Pressure ulcer(s): not present on admission  GI prophylaxis: N/A Lines: N/A Foley:  N/A Code Status:  DNR Last date of multidisciplinary goals of care discussion [12/1]  Labs   CBC: Recent Labs  Lab 01/27/23 0700 01/31/23 0516  WBC 3.9* 5.0  NEUTROABS 1.9  --   HGB 9.6* 9.5*  HCT 27.4* 27.7*  MCV 96.8 98.2  PLT 163 205    Basic Metabolic Panel: Recent Labs  Lab 01/29/23 0638 01/30/23 0542 01/30/23 1239 01/30/23 1834 01/31/23 0516 01/31/23 1501 02/01/23 0239  NA 127* 122* 126* 127* 126* 128* 127*  K 3.9 4.1  --   --  3.9 4.1 3.8  CL 97* 93*  --   --  97* 95* 93*  CO2 19* 17*  --   --  21* 23 26  GLUCOSE 99 125*  --   --  96 108* 117*  BUN 11 14  --   --  33* 24* 25*  CREATININE 0.76 0.85  --   --  0.79 0.89 0.82  CALCIUM 8.2* 8.3*  --   --  8.4* 8.4* 8.6*   GFR: Estimated Creatinine Clearance: 79.1 mL/min (by C-G formula based on SCr of 0.82 mg/dL). Recent Labs  Lab 01/27/23 0700 01/31/23 0516 02/01/23 2108  WBC 3.9* 5.0  --   LATICACIDVEN  --   --  1.7    Liver Function Tests: Recent Labs  Lab 01/28/23 0624 01/29/23 3244 01/30/23 0542 01/31/23 0516 02/01/23 0239   AST 121* 120* 150* 150* 177*  ALT 120* 116* 133* 132* 156*  ALKPHOS 423* 419* 459* 443* 504*  BILITOT 18.8* 18.3* 18.8* 18.9* 21.9*  PROT 5.0* 5.1* 5.5* 5.1* 5.7*  ALBUMIN 1.9* 1.9* 2.1* 1.9* 2.1*   No results for input(s): "LIPASE", "AMYLASE" in the last 168 hours. No results for input(s): "AMMONIA" in the last 168 hours.  ABG    Component Value Date/Time   TCO2 11 09/16/2015 0925     Coagulation Profile: Recent Labs  Lab 01/31/23 0516  INR 1.1    Cardiac Enzymes: No results for input(s): "CKTOTAL", "CKMB", "CKMBINDEX", "TROPONINI" in the last 168 hours.  HbA1C: Hemoglobin A1C  Date/Time Value Ref Range Status  02/21/2021 10:09 AM 5.2 4.0 - 5.6 % Final  02/18/2020 01:59 PM 5.2 4.0 - 5.6 % Final   Hgb A1c MFr Bld  Date/Time  Value Ref Range Status  02/02/2018 08:48 AM 5.4 4.6 - 6.5 % Final    Comment:    Glycemic Control Guidelines for People with Diabetes:Non Diabetic:  <6%Goal of Therapy: <7%Additional Action Suggested:  >8%     CBG: Recent Labs  Lab 02/01/23 2051  GLUCAP 129*    Review of Systems:   Review of Systems  Constitutional:  Positive for fever and malaise/fatigue.  HENT: Negative.    Eyes: Negative.   Respiratory:  Negative for cough, hemoptysis, sputum production and shortness of breath.   Cardiovascular:  Negative for chest pain, palpitations and leg swelling.  Gastrointestinal:  Positive for abdominal pain and nausea. Negative for blood in stool, constipation, diarrhea, melena and vomiting.  Genitourinary: Negative.   Musculoskeletal: Negative.   Skin:  Positive for rash.  Neurological:  Positive for weakness.      Past Medical History:  He,  has a past medical history of AK (actinic keratosis), Allergy, Chicken pox, Compression fracture of L1 lumbar vertebra (HCC), Compression fracture of thoracic vertebra (HCC), Hypertension, Lentigines, and Seizures (HCC).   Surgical History:   Past Surgical History:  Procedure Laterality Date    CARDIOVERSION N/A 05/14/2021   Procedure: CARDIOVERSION;  Surgeon: Little Ishikawa, MD;  Location: [facility] ENDOSCOPY;  Service: Cardiovascular;  Laterality: N/A;   CARDIOVERSION N/A 10/08/2021   Procedure: CARDIOVERSION;  Surgeon: Christell Constant, MD;  Location: [facility] ENDOSCOPY;  Service: Cardiovascular;  Laterality: N/A;   COLONOSCOPY     ESOPHAGOGASTRODUODENOSCOPY N/A 01/29/2023   Procedure: ESOPHAGOGASTRODUODENOSCOPY (EGD);  Surgeon: Iva Boop, MD;  Location: [facility] ENDOSCOPY;  Service: Gastroenterology;  Laterality: N/A;   IR BILIARY DRAIN PLACEMENT WITH CHOLANGIOGRAM  01/31/2023   IR EXCHANGE BILIARY DRAIN  01/24/2023   IR PERC CHOLECYSTOSTOMY  01/02/2023   mitral vavle     Congetinal mitral valve disease.     Social History:   reports that he has never smoked. He has never used smokeless tobacco. He reports current alcohol use of about 14.0 standard drinks of alcohol per week. He reports that he does not use drugs.   Family History:  His family history includes Aneurysm in his paternal grandfather; Cancer in his father; Heart disease in his father, mother, and son; Prostate cancer in his father. There is no history of Colon cancer, Esophageal cancer, Stomach cancer, or Rectal cancer.   Allergies Allergies  Allergen Reactions   Other Other (See Comments)    Childhood reaction to Neosporin Ophthalmic ointment caused severe eye redness, irritation     Home Medications  Prior to Admission medications   Medication Sig Start Date End Date Taking? Authorizing Provider  amLODipine (NORVASC) 5 MG tablet TAKE 1 TABLET (5 MG TOTAL) BY MOUTH DAILY. 01/11/22  Yes [name], [name], MD  Cholecalciferol (VITAMIN D-3 PO) Take 1 capsule by mouth every evening.   Yes [provider]  divalproex (DEPAKOTE) 250 MG DR tablet Take 1 tablet (250 mg total) by mouth 2 (two) times daily. Take in addition to 500 mg twice a day (pt should have a total of 750 mg twice a day) 08/26/22  Yes  Micki Riley, MD  divalproex (DEPAKOTE) 500 MG DR tablet Take 1 tablet (500 mg total) by mouth 2 (two) times daily. 05/20/22  Yes Micki Riley, MD  ELIQUIS 5 MG TABS tablet TAKE 1 TABLET BY MOUTH TWICE A DAY 08/07/22  Yes Mealor, Roberts Gaudy, MD  gabapentin (NEURONTIN) 300 MG capsule Take 1 capsule (300 mg total) by  mouth at bedtime. 09/16/22  Yes Micki Riley, MD  ibandronate (BONIVA) 150 MG tablet TAKE 1 TABLET BY MOUTH EVERY 30 DAYS. TAKE IN AM WITH FULL GLASS OF WATER ON EMPTY STOMACH AND DON'T TAKE ANYTHING ELSE BY MOUTH OR LIE DOWN FOR THE NEXT 30 MINUTES Patient taking differently: Take 150 mg by mouth See admin instructions. Take 1 tablet (150mg ) once a month around the 20th. 07/22/22  Yes [name], [name], MD  LUTEIN PO Take 1 tablet by mouth every evening.   Yes [provider]  metoprolol succinate (TOPROL-XL) 25 MG 24 hr tablet TAKE 1 TABLET (25 MG TOTAL) BY MOUTH DAILY. 09/23/22  Yes BranchAlben Spittle, MD  Multiple Vitamin (MULTIVITAMIN WITH MINERALS) TABS tablet Take 1 tablet by mouth every evening.   Yes [provider]  OVER THE COUNTER MEDICATION Take 1 tablet by mouth 2 (two) times daily. Bone Up   Yes [provider]  sodium chloride flush 0.9 % SOLN injection 10 mLs by Intracatheter route as needed. Flush percutaneous cholecystostomy drain with 10 ml normal saline once daily. 01/03/23 02/02/23 Yes Mickie Kay, NP  amoxicillin-clavulanate (AUGMENTIN) 875-125 MG tablet Take 1 tablet by mouth 2 (two) times daily. Patient not taking: Reported on 01/24/2023    [provider]     Critical care time: n/a      High MDM    Tessie Fass MSN, AGACNP-BC [facility] Pulmonary/Critical Care Medicine Amion for pager 02/02/2023, 9:58 AM

## 2023-02-02 NOTE — IPAL (Signed)
  Interdisciplinary Goals of Care Family Meeting   Date carried out: 02/02/2023  Location of the meeting: Bedside  Member's involved: Physician, Nurse Practitioner, Bedside Registered Nurse, Respiratory Therapist, and Family Member or next of kin  Durable Power of Attorney or acting medical decision maker: Samuel Mayo    Discussion: We discussed goals of care for Samuel Mayo  Mayo brought up topic of decision maker for Samuel if things worsened. They have talked about this many times -- Samuel would not want resuscitative efforts in event of arrest. He endorses this. We also talked about mechanical ventilation, independent of arrest. He would not want intubation for respiratory failure.  Discussed that this is a code status called DNR/I. Both endorse agreement. Will update in chart.   Additionally they talked discussions they have had re QOL. Sounds like this is very important to him. Mayo asked if they should consider hospice care right now. I clearly advised against right now--  I recommend continuing current acute care while we gather more information and hopefully identify some improvable/reversible factors contributing to patients overnight decline. Pending acute course GOC can be revisited as needed. Hope is for oupt follow up w [facility] re abdominal process.  They are in agreement with continuing current acute care, updating code status to DNR/I  Code status:   Code Status: Limited: Do not attempt resuscitation (DNR) -DNR-LIMITED -Do Not Intubate/DNI    Disposition: Continue current acute care  Time spent for the meeting:     Samuel Clam, NP 02/02/2023, 9:24 AM

## 2023-02-02 NOTE — Progress Notes (Signed)
0730- bedside shift report performed. Patient sleeping, but woken up by RN. RN assessed orientation. Patient giving delayed responses and intermittently confused to situation and time. Patient attempting to get out of bed. Vital signs obtained. MD paged regarding patient's status.

## 2023-02-02 NOTE — Progress Notes (Addendum)
Pharmacy Antibiotic Note  Samuel Mayo is a 76 y.o. male admitted on 01/24/2023 with acute cholecystitis. He was previously on day 9 of antibiotics with Augmentin for intraabdominal infection/obstructive jaundice. 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 Patient now presenting with s/sx sepsis. Pharmacy has been consulted for vancomycin and zosyn dosing.   Tmax 101.3 today. Most recent vitals: HR 116 and BP 96/57. CCM now following. Scr 1.18 today increased from 0.82 yesterday.   Plan: Start zosyn 3.375 g IV q8h Give vanc loading 2000 mg x1 Start vanc maintenance 1500 mg IV q24h; eAUC 483 Obtain vancomycin peak and trough levels for goal AUC 400-550  Monitor renal function, culture results, clinical status and resolution of s/sx infection Narrow abx as able and f/u with appropriate duration   Height: 5\' 11"  (180.3 cm) Weight: 86.2 kg (190 lb) IBW/kg (Calculated) : 75.3  Temp (24hrs), Avg:99.7 F (37.6 C), Min:98.3 F (36.8 C), Max:101.3 F (38.5 C)  Recent Labs  Lab 01/27/23 0700 01/28/23 0624 01/29/23 0638 01/30/23 0542 01/31/23 0516 01/31/23 1501 02/01/23 0239 02/01/23 2108  WBC 3.9*  --   --   --  5.0  --   --   --   CREATININE 0.74   < > 0.76 0.85 0.79 0.89 0.82  --   LATICACIDVEN  --   --   --   --   --   --   --  1.7   < > = values in this interval not displayed.    Estimated Creatinine Clearance: 79.1 mL/min (by C-G formula based on SCr of 0.82 mg/dL).    Allergies  Allergen Reactions   Other Other (See Comments)    Childhood reaction to Neosporin Ophthalmic ointment caused severe eye redness, irritation    Antimicrobials this admission:  Unasyn 11/23 >> 11/29 Augmentin 11/29 >> 12/1 Vancomycin 12/1 >> Zosyn 12/1 >>   Microbiology results:  12/1 Bcx: sent    Thank you for allowing pharmacy to be a part of this patient's care.  Jerry Caras, PharmD PGY2 Oncology Pharmacy Resident   02/02/2023 8:27  AM

## 2023-02-03 DIAGNOSIS — K8309 Other cholangitis: Secondary | ICD-10-CM

## 2023-02-03 DIAGNOSIS — A419 Sepsis, unspecified organism: Secondary | ICD-10-CM | POA: Diagnosis not present

## 2023-02-03 DIAGNOSIS — K851 Biliary acute pancreatitis without necrosis or infection: Secondary | ICD-10-CM | POA: Diagnosis not present

## 2023-02-03 DIAGNOSIS — Z66 Do not resuscitate: Secondary | ICD-10-CM | POA: Diagnosis not present

## 2023-02-03 DIAGNOSIS — K831 Obstruction of bile duct: Secondary | ICD-10-CM | POA: Diagnosis not present

## 2023-02-03 DIAGNOSIS — R531 Weakness: Secondary | ICD-10-CM | POA: Diagnosis not present

## 2023-02-03 LAB — CBC
HCT: 26.6 % — ABNORMAL LOW (ref 39.0–52.0)
Hemoglobin: 9.1 g/dL — ABNORMAL LOW (ref 13.0–17.0)
MCH: 34 pg (ref 26.0–34.0)
MCHC: 34.2 g/dL (ref 30.0–36.0)
MCV: 99.3 fL (ref 80.0–100.0)
Platelets: 183 10*3/uL (ref 150–400)
RBC: 2.68 MIL/uL — ABNORMAL LOW (ref 4.22–5.81)
RDW: 19.6 % — ABNORMAL HIGH (ref 11.5–15.5)
WBC: 6.8 10*3/uL (ref 4.0–10.5)
nRBC: 0 % (ref 0.0–0.2)

## 2023-02-03 LAB — COMPREHENSIVE METABOLIC PANEL
ALT: 91 U/L — ABNORMAL HIGH (ref 0–44)
AST: 77 U/L — ABNORMAL HIGH (ref 15–41)
Albumin: 2 g/dL — ABNORMAL LOW (ref 3.5–5.0)
Alkaline Phosphatase: 276 U/L — ABNORMAL HIGH (ref 38–126)
Anion gap: 9 (ref 5–15)
BUN: 56 mg/dL — ABNORMAL HIGH (ref 8–23)
CO2: 24 mmol/L (ref 22–32)
Calcium: 8.3 mg/dL — ABNORMAL LOW (ref 8.9–10.3)
Chloride: 92 mmol/L — ABNORMAL LOW (ref 98–111)
Creatinine, Ser: 1.67 mg/dL — ABNORMAL HIGH (ref 0.61–1.24)
GFR, Estimated: 42 mL/min — ABNORMAL LOW (ref >60)
Glucose, Bld: 94 mg/dL (ref 70–99)
Potassium: 3.4 mmol/L — ABNORMAL LOW (ref 3.5–5.1)
Sodium: 125 mmol/L — ABNORMAL LOW (ref 135–145)
Total Bilirubin: 17 mg/dL — ABNORMAL HIGH (ref <1.2)
Total Protein: 5.2 g/dL — ABNORMAL LOW (ref 6.5–8.1)

## 2023-02-03 MED ORDER — VANCOMYCIN HCL IN DEXTROSE 1-5 GM/200ML-% IV SOLN
1000.0000 mg | INTRAVENOUS | Status: DC
  Administered 2023-02-04: 1000 mg via INTRAVENOUS
  Filled 2023-02-03 (×3): qty 200

## 2023-02-03 MED ORDER — POTASSIUM CHLORIDE CRYS ER 20 MEQ PO TBCR
40.0000 meq | EXTENDED_RELEASE_TABLET | Freq: Once | ORAL | Status: AC
  Administered 2023-02-03: 40 meq via ORAL
  Filled 2023-02-03: qty 2

## 2023-02-03 NOTE — Progress Notes (Signed)
Pharmacy Antibiotic Note  Samuel Mayo is a 77 y.o. male admitted on 01/24/2023 with acute cholecystitis. He was previously on day 9 of antibiotics with Augmentin for intraabdominal infection/obstructive jaundice. 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 Patient now presenting with s/sx sepsis. Pharmacy has been consulted for vancomycin and zosyn dosing.   -WBC= 6.8, tmax= 100.3 -SCr 1.1> 1.6, CrCL ~ 40 -blood cultures pending  Plan: -Continue zosyn 3.375 g IV q8h -Change vancomycin to 1000mg  IV q24 hr (estimated AUC 430 using SCr 1.6)  -Will follow renal function, cultures and clinical progress   Height: 5\' 11"  (180.3 cm) Weight: 86.2 kg (190 lb) IBW/kg (Calculated) : 75.3  Temp (24hrs), Avg:99 F (37.2 C), Min:98.8 F (37.1 C), Max:99.1 F (37.3 C)  Recent Labs  Lab 01/31/23 0516 01/31/23 1501 02/01/23 0239 02/01/23 2108 02/02/23 0801 02/02/23 1117 02/03/23 0208  WBC 5.0  --   --   --  12.7* 9.6 6.8  CREATININE 0.79 0.89 0.82  --  1.18  --  1.67*  LATICACIDVEN  --   --   --  1.7 1.2  --   --     Estimated Creatinine Clearance: 38.8 mL/min (A) (by C-G formula based on SCr of 1.67 mg/dL (H)).    Allergies  Allergen Reactions   Other Other (See Comments)    Childhood reaction to Neosporin Ophthalmic ointment caused severe eye redness, irritation    Antimicrobials this admission:  Unasyn 11/23 >> 11/29 Augmentin 11/29 >> 12/1 Vancomycin 12/1 >> Zosyn 12/1 >>   Microbiology results:  12/1 Bcx: ngtd   Harland German, PharmD Clinical Pharmacist **Pharmacist phone directory can now be found on [email or web address] ([email or web address]).  Listed under [facility].

## 2023-02-03 NOTE — Progress Notes (Signed)
Progress Note: General Surgery Service   Chief Complaint/Subjective: Laying in bed eating breakfast. Wife at bedside.  Questions appropriate  Objective: Vital signs in last 24 hours: Temp:  [98.8 F (37.1 C)-99.7 F (37.6 C)] 98.8 F (37.1 C) (12/02 0300) Pulse Rate:  [79-111] 107 (12/02 0300) Resp:  [3-29] 14 (12/02 0300) BP: (73-130)/(49-98) 92/56 (12/02 0300) SpO2:  [92 %-96 %] 94 % (12/02 0300) Last BM Date : 02/02/23  Intake/Output from previous day: 12/01 0701 - 12/02 0700 In: 215 [IV Piggyback:215] Out: 725 [Drains:725] Intake/Output this shift: No intake/output data recorded.  Physical exam: Jaundiced Drains with bile  Lab Results: CBC  Recent Labs    02/02/23 1117 02/03/23 0208  WBC 9.6 6.8  HGB 10.9* 9.1*  HCT 31.7* 26.6*  PLT 220 183   BMET Recent Labs    02/02/23 0801 02/03/23 0208  NA 127* 125*  K 3.3* 3.4*  CL 92* 92*  CO2 22 24  GLUCOSE 117* 94  BUN 25* 56*  CREATININE 1.18 1.67*  CALCIUM 8.4* 8.3*   PT/INR No results for input(s): "LABPROT", "INR" in the last 72 hours. ABG No results for input(s): "PHART", "HCO3" in the last 72 hours.  Invalid input(s): "PCO2", "PO2"  Anti-infectives: Anti-infectives (From admission, onward)    Start     Dose/Rate Route Frequency Ordered Stop   02/03/23 1000  Vancomycin (VANCOCIN) 1,500 mg in sodium chloride 0.9 % 500 mL IVPB        1,500 mg 250 mL/hr over 120 Minutes Intravenous Every 24 hours 02/02/23 1006     02/02/23 2100  vancomycin (VANCOCIN) IVPB 1000 mg/200 mL premix  Status:  Discontinued        1,000 mg 200 mL/hr over 60 Minutes Intravenous Every 12 hours 02/02/23 0811 02/02/23 1006   02/02/23 1100  vancomycin (VANCOCIN) IVPB 1000 mg/200 mL premix       Placed in "Followed by" Linked Group   1,000 mg 200 mL/hr over 60 Minutes Intravenous  Once 02/02/23 0913 02/02/23 1500   02/02/23 1000  vancomycin (VANCOCIN) IVPB 1000 mg/200 mL premix       Placed in "Followed by" Linked Group    1,000 mg 200 mL/hr over 60 Minutes Intravenous  Once 02/02/23 0913 02/02/23 1340   02/02/23 0900  vancomycin (VANCOREADY) IVPB 2000 mg/400 mL  Status:  Discontinued        2,000 mg 200 mL/hr over 120 Minutes Intravenous  Once 02/02/23 0804 02/02/23 0913   02/02/23 0900  piperacillin-tazobactam (ZOSYN) IVPB 3.375 g        3.375 g 12.5 mL/hr over 240 Minutes Intravenous Every 8 hours 02/02/23 0805     02/01/23 2200  amoxicillin-clavulanate (AUGMENTIN) 875-125 MG per tablet 1 tablet  Status:  Discontinued        1 tablet Oral Every 12 hours 02/01/23 1150 02/02/23 0738   01/31/23 1058  cefOXitin (MEFOXIN) 2 g in sodium chloride 0.9 % 100 mL IVPB        over 30 Minutes  Continuous PRN 01/31/23 1058 01/31/23 1058   01/31/23 0000  cefOXitin (MEFOXIN) 2 g in sodium chloride 0.9 % 100 mL IVPB  Status:  Discontinued        2 g 200 mL/hr over 30 Minutes Intravenous To Radiology 01/30/23 1014 01/30/23 1015   01/25/23 1600  ampicillin-sulbactam (UNASYN) 1.5 g in sodium chloride 0.9 % 100 mL IVPB  Status:  Discontinued        1.5 g 200 mL/hr over 30  Minutes Intravenous Every 6 hours 01/25/23 1525 02/01/23 1150       Medications: Scheduled Meds:  sodium chloride   Intravenous Once   divalproex  750 mg Oral BID   feeding supplement  237 mL Oral BID BM   multivitamin with minerals  1 tablet Oral Daily   sodium chloride flush  3 mL Intravenous Q12H   sodium chloride flush  5 mL Intracatheter Q8H   urea  15 g Oral BID   Continuous Infusions:  piperacillin-tazobactam (ZOSYN)  IV 3.375 g (02/03/23 0513)   vancomycin     PRN Meds:.acetaminophen, diclofenac Sodium, HYDROmorphone (DILAUDID) injection, polyethylene glycol  Assessment/Plan: Samuel Mayo is a 77 year old male with clinical presentation and 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  Originally there was concern for Mirizzi syndrome and a cholecystostomy tube was placed.  The patient did not improve and an internal/external biliary drain was  also placed, and cholangiogram at that time was more concerning for a Klatskin tumor.   CA 19-9 elevated, but this could be due to obstruction, may need rechecked outpatient Bilirubin improving 17 today, max was 21.9 Antibiotics and sepsis/cholangitis treatment per primary team, drains functioning for source control  Surgery team will be available as needed.  Will coordinate a referral to [facility] in [place] when patient is discharged from the hospital for surgical consultation, workup and evaluation for resectability.    LOS: 8 days    Quentin Ore, MD  [facility] Use [email or web address] to contact on call provider  Daily Billing: [id number] - Straightforward / Low MDM

## 2023-02-03 NOTE — Progress Notes (Signed)
Patient ID: Samuel Mayo, male   DOB: Dec 30, 1945, 77 y.o.   MRN: [id number]    Progress Note from the Palliative Medicine Team at [facility]   Patient Name: Samuel Mayo        Date: 02/03/2023 DOB: 20-Feb-1946  Age: 77 y.o. MRN#: [id number] Attending Physician: Burnadette Pop, MD Primary Care Physician: [name], [name], MD Admit Date: 01/24/2023   Reason for Consultation/Follow-up   Establishing Goals of Care   HPI/ Brief Hospital Review  77 y.o. male  with past medical history of a-fib / flutter on Eliquis, mitral valve annuloplasty, HTN, and seizure disorder. 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 Underwent percutaneous cholecystomy tube placed. Discharged 01/03/23 on antibiotics. Saw PCP, found to have worsening jaundice. Went for drain study 11/22 and there was non-visualization of CBD so readmitted on 11/22. GI saw on 11/23 for jaundice. MRI/MRCP showed increased mild to moderate intrahepatic bile duct dilation and new mural thickening and enhancement of the extrahepatic bile duct suspicious for cholangitis.   Patient and family face treatment option decisions, advanced directive decisions and anticipatory care needs.   Subjective  Extensive chart review has been completed prior to meeting with patient/family  including labs, vital signs, imaging, progress/consult notes, orders, medications and available advance directive documents.    This NP assessed patient at the bedside as a follow up for palliative medicine needs and emotional support.  Patient appears to be resting comfortably, he arouses easily to verbal stimuli and gentle touch..  He reports "having a better day".  He is jaundiced  Daughter/Samuel Mayo is at bedside.  Continued education regarding current medical situation, patient and family understand the seriousness.    Plan is for more time  for outcomes present themselves.  All are hopeful for  improvement.  PMT will continue to support holistically  Education offered today regarding  the importance of continued conversation with family and their  medical providers regarding overall plan of care and treatment options,  ensuring decisions are within the context of the patients values and GOCs.  Daughter tells me that there are documented ACP paperwork, she will ask for patient's significant other to bring in for scanning  Questions and concerns addressed   Discussed with primary team and nursing staff  Time: 25  minutes  Detailed review of medical records ( labs, imaging, vital signs), medically appropriate exam ( MS, skin, cardiac,  resp)   discussed with treatment team, counseling and education to patient, family, staff, documenting clinical information, medication management, coordination of care    Lorinda Creed NP  Palliative Medicine Team Team Phone # [phone] Pager [phone]

## 2023-02-03 NOTE — Plan of Care (Signed)

## 2023-02-03 NOTE — TOC Initial Note (Signed)
Transition of Care [facility]) - Initial/Assessment Note    Patient Details  Name: Samuel Mayo MRN: [id number] Date of Birth: 02/26/1946  Transition of Care [facility]) CM/SW Contact:    Harriet Masson, RN Phone Number: 02/03/2023, 2:22 PM  Clinical Narrative:                 Spoke to patient's daughter at bedside while patient was sleeping.  Patient lives with girlfriend and daughter lives in [place].  Physical therapy recommends home health but will wait to arrange until closer to discharge.  Palliative is following.   Expected Discharge Plan: Home w Home Health Services Barriers to Discharge: Continued Medical Work up   Patient Goals and CMS Choice            Expected Discharge Plan and Services   Discharge Planning Services: CM Consult   Living arrangements for the past 2 months: Single Family Home                                      Prior Living Arrangements/Services Living arrangements for the past 2 months: Single Family Home Lives with:: Significant Other Patient language and need for interpreter reviewed:: Yes        Need for Family Participation in Patient Care: Yes (Comment) Care giver support system in place?: Yes (comment)   Criminal Activity/Legal Involvement Pertinent to Current Situation/Hospitalization: No - Comment as needed  Activities of Daily Living   ADL Screening (condition at time of admission) Independently performs ADLs?: Yes (appropriate for developmental age) Is the patient deaf or have difficulty hearing?: No Does the patient have difficulty seeing, even when wearing glasses/contacts?: No Does the patient have difficulty concentrating, remembering, or making decisions?: No  Permission Sought/Granted                  Emotional Assessment         Alcohol / Substance Use: Not Applicable Psych Involvement: No (comment)  Admission diagnosis:  Cholecystitis [K81.9] Obstructive jaundice [K83.1] Acute biliary  pancreatitis, unspecified complication status [K85.10] Patient Active Problem List   Diagnosis Date Noted   Hyperbilirubinemia 02/02/2023   DNR (do not resuscitate) 02/02/2023   DNR (do not resuscitate) discussion 02/02/2023   Malnutrition of moderate degree 01/31/2023   Acute biliary pancreatitis 01/27/2023   Cholecystitis 01/25/2023   Obstructive jaundice 01/24/2023   Acute cholecystitis 12/30/2022   Routine general medical examination at a health care facility 05/07/2022   Seizure (HCC) 04/26/2022   Secondary hypercoagulable state (HCC) 10/29/2021   Atrial fibrillation, chronic (HCC)    Gout, arthritis- R MTP joint. 11/26/2017   Vitamin D deficiency, unspecified 01/30/2017   Osteoporosis 10/23/2016   Hypertension 10/23/2016   Complex partial epilepsy (HCC) 09/21/2015   Status epilepticus (HCC) 09/16/2015   PCP:  [name], [name], MD Pharmacy:   [facility] [phone] Ginette Otto, Kathleen - [address] [place] [address] Phone: [phone] Fax: [phone]  [facility] [address] [place] [address] Phone: [phone] Fax: [phone]     Social Determinants of Health (SDOH) Social History: SDOH Screenings   Food Insecurity: No Food Insecurity (01/25/2023)  Housing: Patient Declined (01/25/2023)  Transportation Needs: No Transportation Needs (01/25/2023)  Utilities: Not At Risk (01/25/2023)  Alcohol Screen: Low Risk  (10/21/2022)  Depression (PHQ2-9): High Risk (01/13/2023)  Financial Resource Strain: Low Risk  (10/21/2022)  Physical Activity: Insufficiently Active (10/21/2022)  Social Connections: Moderately Integrated (10/21/2022)  Stress: Stress Concern Present (10/21/2022)  Tobacco Use: Low Risk  (01/31/2023)   SDOH Interventions:     Readmission Risk Interventions     No data to display

## 2023-02-03 NOTE — Progress Notes (Signed)
Mobility Specialist Progress Note:   02/03/23 1400  Mobility  Activity Ambulated with assistance in hallway  Level of Assistance Contact guard assist, steadying assist  Assistive Device Other (Comment) (IV Pole)  Distance Ambulated (ft) 200 ft  Activity Response Tolerated well  Mobility Referral Yes  $Mobility charge 1 Mobility  Mobility Specialist Start Time (ACUTE ONLY) 1405  Mobility Specialist Stop Time (ACUTE ONLY) 1426  Mobility Specialist Time Calculation (min) (ACUTE ONLY) 21 min    Pt received in bed, agreeable to mobility. Ambulated w/ slow and steady gait. Asymptomatic w/ no complaints throughout. Required cues d/t having trouble processing some commands. Pt left in bed with call bell and NT present.  D'Vante Earlene Plater Mobility Specialist Please contact via Special educational needs teacher or Rehab office at [phone]

## 2023-02-03 NOTE — Progress Notes (Signed)
Referring Physician(s): Dr Angelia Mould  Supervising Physician: Dr. Miles Costain  Patient Status:  [facility] - In-pt  Chief Complaint:  Obstructive jaundice   Subjective:  77 yo male admitted on 10/28 when he presented with jaundice.  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  Underwent percutaneous cholecystomy tube placement on 10/31 and was discharged home with oral antibiotics.  Outpatient labs showed persistently elevated liver enzymes. Drain study on 11/22 by IR showed persistent occlusion of cystic duct and no opacification of CBD.  MRCP showed dilated intrahepatic bile duct with thickening of the extrahepatic bile duct, suspicious for cholangitis.  Continues to have severe elevated bilirubin, alkaline phosphatase, hyponatremia.  General surgery, GI consulted.  Currently on Unasyn. Denies abdomen pain, nausea or vomiting today.  Remains intensely jaundiced. Attempted  ERCP on 11/27  but turned out to be unsuccessful.   S/p PTC with biliary drain placement on 11/29. Has had good outpt >775mL daily since placement  Feeling a little better Family at bedside   Allergies: Other  Medications:  Current Facility-Administered Medications:    0.9 %  sodium chloride infusion (Manually program via Guardrails IV Fluids), , Intravenous, Once, Burnadette Pop, MD   acetaminophen (TYLENOL) tablet 650 mg, 650 mg, Oral, Q6H PRN, Burnadette Pop, MD, 650 mg at 02/02/23 1448   diclofenac Sodium (VOLTAREN) 1 % topical gel 2 g, 2 g, Topical, QID PRN, Anthoney Harada, NP   divalproex (DEPAKOTE) DR tablet 750 mg, 750 mg, Oral, BID, Synetta Fail, MD, 750 mg at 02/03/23 9562   feeding supplement (ENSURE ENLIVE / ENSURE PLUS) liquid 237 mL, 237 mL, Oral, BID BM, Adhikari, Amrit, MD, 237 mL at 02/03/23 0904   HYDROmorphone (DILAUDID) injection 0.5 mg, 0.5 mg, Intravenous, Q4H PRN, Burnadette Pop, MD   multivitamin with minerals tablet 1 tablet, 1 tablet, Oral, Daily,  Adhikari, Amrit, MD, 1 tablet at 02/03/23 0907   piperacillin-tazobactam (ZOSYN) IVPB 3.375 g, 3.375 g, Intravenous, Q8H, Roskowski, Stephanie, RPH, Stopped at 02/03/23 0514   polyethylene glycol (MIRALAX / GLYCOLAX) packet 17 g, 17 g, Oral, Daily PRN, Synetta Fail, MD   sodium chloride flush (NS) 0.9 % injection 3 mL, 3 mL, Intravenous, Q12H, Synetta Fail, MD, 3 mL at 02/03/23 0908   sodium chloride flush (NS) 0.9 % injection 5 mL, 5 mL, Intracatheter, Q8H, Wagner, Jaime, DO, 5 mL at 02/03/23 0523   urea (URE-NA) oral packet 15 g, 15 g, Oral, BID, Darnell Level, MD, 15 g at 02/03/23 0907   Vancomycin (VANCOCIN) 1,500 mg in sodium chloride 0.9 % 500 mL IVPB, 1,500 mg, Intravenous, Q24H, Roskowski, [place], RPH    Vital Signs: BP (!) 92/56   Pulse (!) 107   Temp 98.8 F (37.1 C) (Oral)   Resp 14   Ht 5\' 11"  (1.803 m)   Wt 190 lb (86.2 kg)   SpO2 94%   BMI 26.50 kg/m   Physical Exam Vitals reviewed.  Cardiovascular:     Rate and Rhythm: Normal rate and regular rhythm.     Heart sounds: Normal heart sounds.  Abdominal:     Palpations: Abdomen is soft.  Skin:    General: Skin is warm.     Comments: Sites of drain are clean. Some bilious leakage around bili drain NT no bleeding OP Perc chole drain- scant       Imaging: DG CHEST PORT 1 VIEW  Result Date: 02/02/2023 CLINICAL DATA:  Fever EXAM: PORTABLE CHEST 1 VIEW COMPARISON:  12/26/2022 x-ray FINDINGS: Status post median sternotomy. Enlarged cardiopericardial silhouette with calcified tortuous aorta. Prosthetic valve. Overlapping cardiac leads. There is some linear opacity at the bases likely scar or atelectasis. No pneumothorax, effusion or edema. Healed right eighth rib fracture. IMPRESSION: Postop chest with enlarged heart and calcified aorta. Basilar atelectasis Electronically Signed   By: Karen Kays M.D.   On: 02/02/2023 16:08   CT ABDOMEN PELVIS WO CONTRAST  Result Date: 02/02/2023 CLINICAL DATA:   Anemia. EXAM: CT ABDOMEN AND PELVIS WITHOUT CONTRAST TECHNIQUE: Multidetector CT imaging of the abdomen and pelvis was performed following the standard protocol without IV contrast. RADIATION DOSE REDUCTION: This exam was performed according to the departmental dose-optimization program which includes automated exposure control, adjustment of the mA and/or kV according to patient size and/or use of iterative reconstruction technique. COMPARISON:  January 31, 2023. FINDINGS: Lower chest: Minimal bibasilar subsegmental atelectasis is noted. Hepatobiliary: Percutaneous cholecystostomy tube is again noted with decompression of the bladder. Stable position of internal external biliary drain with distal tip in duodenum. Stable mild left hepatic biliary dilatation with periportal edema. Pancreas: Unremarkable. No pancreatic ductal dilatation or surrounding inflammatory changes. Spleen: Normal in size without focal abnormality. Adrenals/Urinary Tract: Adrenal glands are unremarkable. Kidneys are normal, without renal calculi, focal lesion, or hydronephrosis. Bladder is unremarkable. Stomach/Bowel: Stomach is within normal limits. Appendix appears normal. No evidence of bowel wall thickening, distention, or inflammatory changes. Vascular/Lymphatic: Aortic atherosclerosis. No enlarged abdominal or pelvic lymph nodes. Reproductive: Mild prostatic enlargement is noted. Other: No abdominal wall hernia or abnormality. No abdominopelvic ascites. Musculoskeletal: Old L1 and L5 fractures are again noted. No acute abnormality seen. IMPRESSION: Percutaneous cholecystostomy tube is again noted. Stable position of internal external biliary drain with distal tip in duodenum. Stable mild left hepatic biliary dilatation with periportal edema. Electronically Signed   By: Lupita Raider M.D.   On: 02/02/2023 13:43   CT ABDOMEN PELVIS W CONTRAST  Result Date: 01/31/2023 CLINICAL DATA:  Gallbladder and biliary cancer. EXAM: CT ABDOMEN  AND PELVIS WITH CONTRAST TECHNIQUE: Multidetector CT imaging of the abdomen and pelvis was performed using the standard protocol following bolus administration of intravenous contrast. RADIATION DOSE REDUCTION: This exam was performed according to the departmental dose-optimization program which includes automated exposure control, adjustment of the mA and/or kV according to patient size and/or use of iterative reconstruction technique. CONTRAST:  75mL OMNIPAQUE IOHEXOL 350 MG/ML SOLN COMPARISON:  MRI abdomen 01/24/2023. FINDINGS: Lower chest: There is atelectasis in the lung bases with trace bilateral pleural effusions. Hepatobiliary: Transhepatic percutaneous biliary catheter present with distal tip in the duodenal. There is mild intrahepatic biliary ductal dilatation in the left lobe of the liver with some periportal edema similar to prior MRI given differences in technique. There is a small amount of pneumobilia and contrast throughout the biliary tree likely related to recent procedure. No focal liver lesions are seen. Percutaneous cholecystostomy tube present. There some contrast in the gallbladder. The gallbladder is decompressed and there is diffuse wall thickening similar to prior. Pancreas: Unremarkable. No pancreatic ductal dilatation or surrounding inflammatory changes. Spleen: Normal in size without focal abnormality. Adrenals/Urinary Tract: Adrenal glands are unremarkable. Kidneys are normal, without renal calculi, focal lesion, or hydronephrosis. Bladder is unremarkable. Stomach/Bowel: Stomach is within normal limits. Appendix appears normal. No evidence of bowel wall thickening, distention, or inflammatory changes. There is sigmoid colon diverticulosis. Vascular/Lymphatic: No significant vascular findings are present. No enlarged abdominal or pelvic lymph nodes. Reproductive: Prostate gland is enlarged. Other: There  is presacral edema. There is trace free fluid in the right upper quadrant similar to  prior. There is no focal abdominal wall hernia. Musculoskeletal: There are compression deformities of T8, T9, T11, L1 and L5 which are favored as chronic, but age indeterminate. The bones are diffusely osteopenic. There is mild body wall edema. IMPRESSION: 1. Transhepatic percutaneous biliary catheter present with distal tip in the duodenum. 2. Percutaneous cholecystostomy tube present. Gallbladder is decompressed with diffuse wall thickening similar to prior. 3. Mild intrahepatic biliary ductal dilatation in the left lobe of the liver with periportal edema similar to prior MRI. 4. Trace free fluid in the right upper quadrant similar to prior. 5. Trace bilateral pleural effusions. 6. Mild body wall edema. 7. Multiple compression deformities of the thoracic and lumbar spine are favored as chronic, but age indeterminate. Correlate clinically. Electronically Signed   By: Darliss Cheney M.D.   On: 01/31/2023 20:37   IR BILIARY DRAIN PLACEMENT WITH CHOLANGIOGRAM  Result Date: 01/31/2023 INDICATION: 77 year old male with biliary obstruction, presents for percutaneous transhepatic cholangiogram and internal/external drain placement EXAM: IMAGE GUIDED PERCUTANEOUS TRANSHEPATIC CHOLANGIOGRAM INTERNAL/EXTERNAL BILIARY DRAIN PLACEMENT MEDICATIONS: 2 g Mefoxin; The antibiotic was administered within an appropriate time frame prior to the initiation of the procedure. ANESTHESIA/SEDATION: Moderate (conscious) sedation was employed during this procedure. A total of Versed 2 mg and Fentanyl 150 mcg was administered intravenously by the radiology nurse. Total intra-service moderate Sedation Time: 43 minutes. The patient's level of consciousness and vital signs were monitored continuously by radiology nursing throughout the procedure under my direct supervision. FLUOROSCOPY: Radiation Exposure Index (as provided by the fluoroscopic device): 282 mGy Kerma COMPLICATIONS: None PROCEDURE: The procedure, risks, benefits, and  alternatives were explained to the patient and the patient's family. A complete informed consent was performed, with risk benefit analysis. Specific risks that were discussed for the procedure include bleeding, infection, biliary sepsis, IC use day, organ injury, need for further procedure, need for further surgery, long-term drain placement, cardiopulmonary collapse, death. Questions regarding the procedure were encouraged and answered. The patient understands and consents to the procedure. Patient is position in supine position on the fluoroscopy table, and the upper abdomen was prepped and draped in the usual sterile fashion. Maximum barrier sterile technique with sterile gowns and gloves were used for the procedure. A timeout was performed prior to the initiation of the procedure. Local anesthesia was provided with 1% lidocaine with epinephrine. Ultrasound survey of the left liver lobe was performed, with then ultrasound of the right liver lobe. We elected to access transhepatic biliary system via the right. 1% lidocaine was used for local anesthesia, with generous infiltration of the skin and subcutaneous tissues in and inter left costal location. A Chiba needle was advanced under ultrasound guidance into the right liver lobe, targeting biliary system. Once the tip of the needle was confirmed within the biliary system by injecting small aliquots of contrast, images were stored of the biliary system after partially opacifying the biliary tree via the needle. An 018 wire was then advanced centrally. The needle was removed, a small incision was made with an 11 blade scalpel, and then a triaxial Bard system was advanced into the biliary system. The metal stiffener and dilator were removed, we confirmed placement with contrast infusion. We then attempted to navigate through stenotic ductal system at the hilum, presumably the common hepatic duct. A coaxial Glidewire and 4 French glide cath were then used to attempt  to navigate across the obstruction at the hilum  of the liver. A combination of the glide cath, Glidewire, and a roadrunner wire were required to access the common hepatic duct. Once the catheter and wire were advanced into the external hepatic ducts, further images were acquired. This demonstrated that the duodenal diverticulum was distorting the anatomy at the ampulla. After some initial attempts fail to cross the ampulla, a coon's wire was placed and then a 6 French 35 cm straight sheath was placed into the common bile duct. At this point we were able to navigate the angled diagnostic catheter and the Glidewire through the ampulla into the duodenum. With the diagnostic catheter in the second portion the duodenum, the coon's wire was then placed. Twelve French dilation of the subcutaneous tissue tracks was performed, and then a 42 [place] biliary drain was placed as an internal/external biliary drain. Small amount of contrast confirmed location. The patient tolerated the procedure well and remained hemodynamically stable throughout. No complications were encountered and no significant blood loss was encountered. FINDINGS: Percutaneous transhepatic cholangiogram demonstrates significant stenotic narrowing in the bile ducts at the hilum of the liver, suspicious for malignancy/[place] tumor IMPRESSION: Status post image guided percutaneous transhepatic cholangiogram with internal/external biliary drain placement. Injection of the percutaneous cholecystostomy tube demonstrates that the cystic duct remains occluded. Signed, Yvone Neu. Miachel Roux, RPVI [facility] Electronically Signed   By: Gilmer Mor D.O.   On: 01/31/2023 16:46    Labs:  CBC: Recent Labs    01/31/23 0516 02/02/23 0801 02/02/23 1117 02/03/23 0208  WBC 5.0 12.7* 9.6 6.8  HGB 9.5* 6.8* 10.9* 9.1*  HCT 27.7* 19.7* 31.7* 26.6*  PLT 205 250 220 183    COAGS: Recent Labs     12/30/22 2151 12/31/22 0705 12/31/22 1747 01/01/23 1154 01/25/23 0548 01/31/23 0516  INR 1.3*  --   --   --  1.2 1.1  APTT 36 34 61* 43*  --   --     BMP: Recent Labs    01/31/23 1501 02/01/23 0239 02/02/23 0801 02/03/23 0208  NA 128* 127* 127* 125*  K 4.1 3.8 3.3* 3.4*  CL 95* 93* 92* 92*  CO2 23 26 22 24   GLUCOSE 108* 117* 117* 94  BUN 24* 25* 25* 56*  CALCIUM 8.4* 8.6* 8.4* 8.3*  CREATININE 0.89 0.82 1.18 1.67*  GFRNONAA >60 >60 >60 42*    LIVER FUNCTION TESTS: Recent Labs    01/31/23 0516 02/01/23 0239 02/02/23 0801 02/03/23 0208  BILITOT 18.9* 21.9* 18.1* 17.0*  AST 150* 177* 105* 77*  ALT 132* 156* 116* 91*  ALKPHOS 443* 504* 391* 276*  PROT 5.1* 5.7* 5.2* 5.2*  ALBUMIN 1.9* 2.1* 1.9* 2.0*   Drain Location: RUQ Size: Fr size: 10 Fr  Fr size: 12 Fr Date of placement: 10/31 and 11/29  Currently to: Drain collection device: gravity 24 hour output:  Output by Drain (mL) 02/01/23 0701 - 02/01/23 1900 02/01/23 1901 - 02/02/23 0700 02/02/23 0701 - 02/02/23 1900 02/02/23 1901 - 02/03/23 0700 02/03/23 0701 - 02/03/23 1111  Biliary Tube Cook slip-coat 10 Fr. RUQ  25 0    Biliary Tube Cook slip-coat 12 Fr. RUQ 530 275 200 525      Assessment and Plan:  Perc chole drain placed 10/31 Right sided PTC/Bili drain placed 11/29 IR will follow TB trending down, now 17 If Bili does not cont to drop or plateaus, may benefit from left sided bili drain for better decompression. This was discussed with  pt and family.   Electronically Signed: Brayton El, PA-C 02/03/2023, 11:11 AM   I spent a total of 15 Minutes at the the patient's bedside AND on the patient's hospital floor or unit, greater than 50% of which was counseling/coordinating care for perc chole drain/ Biliary drain

## 2023-02-03 NOTE — Progress Notes (Signed)
Physical Therapy Treatment Patient Details Name: Samuel Mayo MRN: [id number] DOB: [birth year] Today's Date: 02/03/2023   History of Present Illness 77 y.o. male presents to [facility] 01/24/23 with worsening jaundice and follow up imaging from recent admit that showed non-visualization of CBD. MRI/MRCP showed increased mild to moderate intrahepatic bile duct dilation and new mural thickening and enhancement of the extrahepatic bile duct suspicious for cholangitis. IR performed PTC 11/29 with placement of internal/external drain. 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 Recent admit from 10/28 -11/1 for cholecystitis, diagnosed with Mirizzi syndrome ,underwent cholecystostomy. PMHx: seizure disorder on depakote, PAF, HTN, a-fib, acute biliary pancreatitis, cholecystitis, gout    PT Comments  Pt with noted medical and functional decline over the weekend. Pt with noted confusion, delayed processing, inability to sequence, difficulty following simple commands, decreased insight to safety and deficits. Pt requiring minA for transfers and amb using IV pole. Pt was indep and active PTA. Acute PT to cont to follow as appropriate.    If plan is discharge home, recommend the following: A little help with walking and/or transfers;Help with stairs or ramp for entrance;Assist for transportation   Can travel by private vehicle        Equipment Recommendations  None recommended by PT (will continue to assess)    Recommendations for Other Services       Precautions / Restrictions Precautions Precautions: Fall Precaution Comments: biliary tube R UQ Restrictions Weight Bearing Restrictions: No     Mobility  Bed Mobility Overal bed mobility: Needs Assistance Bed Mobility: Sit to Supine       Sit to supine: Min assist   General bed mobility comments: pt received on commode. pt requiring max directional cues to return to supine from sitting, minA for LE management back into bed     Transfers Overall transfer level: Needs assistance Equipment used: None Transfers: Sit to/from Stand Sit to Stand: Min assist           General transfer comment: minA to power up from toliet, max directional cues to sit on EOB as pt trying to sit next to bed not on the edge of the bed    Ambulation/Gait Ambulation/Gait assistance: Min assist Gait Distance (Feet): 60 Feet Assistive device: IV Pole Gait Pattern/deviations: Step-through pattern, Trunk flexed Gait velocity: dec compared to initial eval     General Gait Details: pt with delayed processing, max encouragement to ambulation, minA for IV pole management and directional cues. pt unable to maintain full upright posture t/o amb   Stairs Stairs:  (unable to complete due to impaired cognition and functional decline)           Wheelchair Mobility     Tilt Bed    Modified Rankin (Stroke Patients Only)       Balance Overall balance assessment: Needs assistance Sitting-balance support: No upper extremity supported, Feet supported Sitting balance-Leahy Scale: Good     Standing balance support: No upper extremity supported, During functional activity Standing balance-Leahy Scale: Fair Standing balance comment: able to stand statically w/ no UE support, w/ challenges to balance pt will reach for support                            Cognition Arousal: Alert Behavior During Therapy: Flat affect Overall Cognitive Status: Impaired/Different from baseline Area of Impairment: Orientation, Memory, Following commands, Safety/judgement, Awareness  Orientation Level: Disoriented to, Time   Memory: Decreased short-term memory Following Commands: Follows one step commands inconsistently Safety/Judgement: Decreased awareness of safety, Decreased awareness of deficits Awareness: Emergent   General Comments: pt with medical, functional, and cognitive decline over the weekend. Pt with  poor processing and unable to sequence stepping backwards to  bed and side step along EOB prior to sitting on bed despite max, frequent verbal and tactile cues        Exercises      General Comments General comments (skin integrity, edema, etc.): VSS, pt with continued jaundice      Pertinent Vitals/Pain Pain Assessment Pain Assessment: No/denies pain    Home Living                          Prior Function            PT Goals (current goals can now be found in the care plan section) Acute Rehab PT Goals PT Goal Formulation: With patient/family Time For Goal Achievement: 02/15/23 Potential to Achieve Goals: Good Progress towards PT goals: Not progressing toward goals - comment (medical decline)    Frequency    Min 1X/week      PT Plan      Co-evaluation              AM-PAC PT "6 Clicks" Mobility   Outcome Measure  Help needed turning from your back to your side while in a flat bed without using bedrails?: A Little Help needed moving from lying on your back to sitting on the side of a flat bed without using bedrails?: A Little Help needed moving to and from a bed to a chair (including a wheelchair)?: A Little Help needed standing up from a chair using your arms (e.g., wheelchair or bedside chair)?: A Little Help needed to walk in hospital room?: A Lot Help needed climbing 3-5 steps with a railing? : A Lot 6 Click Score: 16    End of Session   Activity Tolerance: Patient tolerated treatment well Patient left: with family/visitor present;with bed alarm set;in bed Nurse Communication: Mobility status PT Visit Diagnosis: Unsteadiness on feet (R26.81)     Time: 1610-9604 PT Time Calculation (min) (ACUTE ONLY): 18 min  Charges:    $Gait Training: 8-22 mins PT General Charges $$ ACUTE PT VISIT: 1 Visit                     Lewis Shock, PT, DPT Acute Rehabilitation Services Secure chat preferred Office #: [phone]    Iona Hansen 02/03/2023, 11:16 AM

## 2023-02-03 NOTE — Progress Notes (Addendum)
PROGRESS NOTE  Samuel Mayo  XLK:[id number] DOB: 03-12-1945 DOA: 01/24/2023 PCP: [name], [name], MD   Brief Narrative: Patient is a 77 year old male with history of hypertension, atrial fibrillation, seizures, gout, MVR s/p angioplasty who presented with worsening jaundice.  Patient was recently admitted from 10/28 -11/1 for cholecystitis, diagnosed with Mirizzi syndrome ,underwent cholecystostomy.  Follow-up imaging showed persistent occlusion of the cystic duct, elevated liver enzymes so recommended to go to the emergency department.  Patient also  reported decreased appetite, weakness.  On presentation, he was hemodynamically stable.  Lab work showed sodium of 127, lipase of 1228, elevated liver enzymes, bilirubin of 20.  General surgery, GI consulted.  Attempted  ERCP but turned out to be unsuccessful.  IR did PTC with biliary drain placement on 11/29.  Patient became febrile last night, hypotensive, tachycardic.  Underlying sepsis suspected.  Critical care , palliaitive care consulted.  Current plan is to continue IV antibiotics, follow-up cultures, allowing improvement in the liver function.  Assessment & Plan:  Principal Problem:   Obstructive jaundice Active Problems:   Atrial fibrillation, chronic (HCC)   Hypertension   Seizure (HCC)   Cholecystitis   Acute biliary pancreatitis   Malnutrition of moderate degree   Hyperbilirubinemia   DNR (do not resuscitate)   DNR (do not resuscitate) discussion   Obstructive jaundice: Initially admitted on 10/28 when he presented with jaundice.  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  Underwent percutaneous cholecystomy tube placement on 10/31 and was discharged home with oral antibiotics.  Outpatient labs showed persistently elevated liver enzymes. Drain study on 11/22 by IR showed persistent occlusion of cystic duct and no opacification of CBD.  MRCP showed dilated intrahepatic bile duct with  thickening of the extrahepatic bile duct, suspicious for cholangitis.  Continues to have severe elevated bilirubin, alkaline phosphatase, hyponatremia.  General surgery, GI consulted.  Currently on Unasyn. Denies abdomen pain, nausea or vomiting today.  Remains intensely jaundiced. Attempted  ERCP on 11/27  but turned out to be unsuccessful.   IR did PTC with biliary drain placement on 11/29.  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    He will follow-up with Dr. Allen(hepatobiliary surgery) as an  outpatient.  Dr. Eliot Ford office will arrange outpatient follow-up with [facility].   Sepsis: Became febrile since the night of 11/30, developed leukocytosis.  Lactate level normal.  Patient became lethargic.  Underlying sepsis possible.  CT abdomen/pelvis ordered without finding of any acute problem.  Continue broad spectrum antibiotics.  Culture ordered, no growth till date.  He is afebrile this morning.  No leukocytosis  Hyponatremia: Baseline sodium runs low in the range of 120-132.  Sodium dropped to the range of 122, prompting nephrology consult.  Started on urea, Lasix now stopped.  Likely SIADH.  Sodium is stable in the range of 120s.  Lasix discontinued  AKI: Likely hemodynamically mediated, patient was hypotensive yesterday.  Continue to monitor.  Nephrology following  Hypertension: On amlodipine, Toprol at home.  Currently on hold  Paroxysmal A-fib: On metoprolol.  Continue to monitor on telemetry.  Eliquis on hold.  Remains in sinus tachycardia.  Seizure disorder: On Depakote.  Elevated liver enzymes is not from Depakote but from obstructive liver disease.  History of mitral valve regurgitation: Status post annuloplasty.  Follow-up with cardiology as an outpatient  Goals of care: Palliative care, critical care consulted.  Patient might have underlying biliary tumor which looks like unresectable as per general surgery.  Poor prognosis.  CODE STATUS changed to DNR.  Might be a  candidate for hospice/comfort care if further declines.       Nutrition Problem: Moderate Malnutrition Etiology: acute illness    DVT prophylaxis:SCDs Start: 01/24/23 1337     Code Status: Limited: Do not attempt resuscitation (DNR) -DNR-LIMITED -Do Not Intubate/DNI   Family Communication: Significant other at bedside on 12/2   Patient status:Inpatient  Patient is from :Home  Anticipated discharge to: home  Estimated DC date: 2-3 days    Consultants: GI, general surgery, PCCM, palliative care  Procedures: ERCP, PTC  Antimicrobials:  Anti-infectives (From admission, onward)    Start     Dose/Rate Route Frequency Ordered Stop   02/03/23 1000  Vancomycin (VANCOCIN) 1,500 mg in sodium chloride 0.9 % 500 mL IVPB        1,500 mg 250 mL/hr over 120 Minutes Intravenous Every 24 hours 02/02/23 1006     02/02/23 2100  vancomycin (VANCOCIN) IVPB 1000 mg/200 mL premix  Status:  Discontinued        1,000 mg 200 mL/hr over 60 Minutes Intravenous Every 12 hours 02/02/23 0811 02/02/23 1006   02/02/23 1100  vancomycin (VANCOCIN) IVPB 1000 mg/200 mL premix       Placed in "Followed by" Linked Group   1,000 mg 200 mL/hr over 60 Minutes Intravenous  Once 02/02/23 0913 02/02/23 1500   02/02/23 1000  vancomycin (VANCOCIN) IVPB 1000 mg/200 mL premix       Placed in "Followed by" Linked Group   1,000 mg 200 mL/hr over 60 Minutes Intravenous  Once 02/02/23 0913 02/02/23 1340   02/02/23 0900  vancomycin (VANCOREADY) IVPB 2000 mg/400 mL  Status:  Discontinued        2,000 mg 200 mL/hr over 120 Minutes Intravenous  Once 02/02/23 0804 02/02/23 0913   02/02/23 0900  piperacillin-tazobactam (ZOSYN) IVPB 3.375 g        3.375 g 12.5 mL/hr over 240 Minutes Intravenous Every 8 hours 02/02/23 0805     02/01/23 2200  amoxicillin-clavulanate (AUGMENTIN) 875-125 MG per tablet 1 tablet  Status:  Discontinued        1 tablet Oral Every 12 hours 02/01/23 1150 02/02/23 0738   01/31/23 1058  cefOXitin  (MEFOXIN) 2 g in sodium chloride 0.9 % 100 mL IVPB        over 30 Minutes  Continuous PRN 01/31/23 1058 01/31/23 1058   01/31/23 0000  cefOXitin (MEFOXIN) 2 g in sodium chloride 0.9 % 100 mL IVPB  Status:  Discontinued        2 g 200 mL/hr over 30 Minutes Intravenous To Radiology 01/30/23 1014 01/30/23 1015   01/25/23 1600  ampicillin-sulbactam (UNASYN) 1.5 g in sodium chloride 0.9 % 100 mL IVPB  Status:  Discontinued        1.5 g 200 mL/hr over 30 Minutes Intravenous Every 6 hours 01/25/23 1525 02/01/23 1150       Subjective: Patient seen and examined the bedside today.  He looks remarkably improved from yesterday.  He is alert and oriented.  Not lethargic anymore.  He also ambulated on the hallway with the physical therapy with assistance.  He had a bowel movement.  Denies any nausea, vomiting or abdominal pain.  Afebrile this morning.  Objective: Vitals:   02/03/23 0000 02/03/23 0100 02/03/23 0200 02/03/23 0300  BP: (!) 76/52 (!) 92/57 98/60 (!) 92/56  Pulse: 81 99 79 (!) 107  Resp: 14 12 19 14   Temp:  98.8 F (37.1 C)  TempSrc:    Oral  SpO2: 93% 96% 93% 94%  Weight:      Height:        Intake/Output Summary (Last 24 hours) at 02/03/2023 1016 Last data filed at 02/03/2023 0800 Gross per 24 hour  Intake 237.35 ml  Output 525 ml  Net -287.65 ml   Filed Weights   01/24/23 0953  Weight: 86.2 kg    Examination:  General exam: Weak, deconditioned but overall comfortable HEENT: PERRL Respiratory system: Diminished air sounds bilaterally Cardiovascular system: Sinus tach today Gastrointestinal system: Abdomen is nondistended, soft and nontender.  2 right sided drains Central nervous system: Alert and oriented Extremities: trace LE  edema, no clubbing ,no cyanosis Skin: icterus    Data Reviewed: I have personally reviewed following labs and imaging studies  CBC: Recent Labs  Lab 01/31/23 0516 02/02/23 0801 02/02/23 1117 02/03/23 0208  WBC 5.0 12.7* 9.6 6.8   HGB 9.5* 6.8* 10.9* 9.1*  HCT 27.7* 19.7* 31.7* 26.6*  MCV 98.2 98.5 100.3* 99.3  PLT 205 250 220 183   Basic Metabolic Panel: Recent Labs  Lab 01/31/23 0516 01/31/23 1501 02/01/23 0239 02/02/23 0801 02/03/23 0208  NA 126* 128* 127* 127* 125*  K 3.9 4.1 3.8 3.3* 3.4*  CL 97* 95* 93* 92* 92*  CO2 21* 23 26 22 24   GLUCOSE 96 108* 117* 117* 94  BUN 33* 24* 25* 25* 56*  CREATININE 0.79 0.89 0.82 1.18 1.67*  CALCIUM 8.4* 8.4* 8.6* 8.4* 8.3*     Recent Results (from the past 240 hour(s))  Culture, blood (Routine X 2) w Reflex to ID Panel     Status: None (Preliminary result)   Collection Time: 02/02/23  8:03 AM   Specimen: BLOOD LEFT ARM  Result Value Ref Range Status   Specimen Description BLOOD LEFT ARM  Final   Special Requests   Final    BOTTLES DRAWN AEROBIC AND ANAEROBIC Blood Culture results may not be optimal due to an inadequate volume of blood received in culture bottles   Culture   Final    NO GROWTH < 24 HOURS Performed at [facility], [address], [place], [place] [address]    Report Status PENDING  Incomplete  Culture, blood (Routine X 2) w Reflex to ID Panel     Status: None (Preliminary result)   Collection Time: 02/02/23  8:04 AM   Specimen: BLOOD LEFT ARM  Result Value Ref Range Status   Specimen Description BLOOD LEFT ARM  Final   Special Requests   Final    BOTTLES DRAWN AEROBIC AND ANAEROBIC Blood Culture results may not be optimal due to an inadequate volume of blood received in culture bottles   Culture   Final    NO GROWTH < 24 HOURS Performed at [facility], [address], [place], [place] [address]    Report Status PENDING  Incomplete     Radiology Studies: DG CHEST PORT 1 VIEW  Result Date: 02/02/2023 CLINICAL DATA:  Fever EXAM: PORTABLE CHEST 1 VIEW COMPARISON:  12/26/2022 x-ray FINDINGS: Status post median sternotomy. Enlarged cardiopericardial silhouette with calcified tortuous aorta. Prosthetic valve. Overlapping  cardiac leads. There is some linear opacity at the bases likely scar or atelectasis. No pneumothorax, effusion or edema. Healed right eighth rib fracture. IMPRESSION: Postop chest with enlarged heart and calcified aorta. Basilar atelectasis Electronically Signed   By: Karen Kays M.D.   On: 02/02/2023 16:08  CT ABDOMEN PELVIS WO CONTRAST  Result Date: 02/02/2023 CLINICAL DATA:  Anemia. EXAM: CT ABDOMEN AND PELVIS WITHOUT CONTRAST TECHNIQUE: Multidetector CT imaging of the abdomen and pelvis was performed following the standard protocol without IV contrast. RADIATION DOSE REDUCTION: This exam was performed according to the departmental dose-optimization program which includes automated exposure control, adjustment of the mA and/or kV according to patient size and/or use of iterative reconstruction technique. COMPARISON:  January 31, 2023. FINDINGS: Lower chest: Minimal bibasilar subsegmental atelectasis is noted. Hepatobiliary: Percutaneous cholecystostomy tube is again noted with decompression of the bladder. Stable position of internal external biliary drain with distal tip in duodenum. Stable mild left hepatic biliary dilatation with periportal edema. Pancreas: Unremarkable. No pancreatic ductal dilatation or surrounding inflammatory changes. Spleen: Normal in size without focal abnormality. Adrenals/Urinary Tract: Adrenal glands are unremarkable. Kidneys are normal, without renal calculi, focal lesion, or hydronephrosis. Bladder is unremarkable. Stomach/Bowel: Stomach is within normal limits. Appendix appears normal. No evidence of bowel wall thickening, distention, or inflammatory changes. Vascular/Lymphatic: Aortic atherosclerosis. No enlarged abdominal or pelvic lymph nodes. Reproductive: Mild prostatic enlargement is noted. Other: No abdominal wall hernia or abnormality. No abdominopelvic ascites. Musculoskeletal: Old L1 and L5 fractures are again noted. No acute abnormality seen. IMPRESSION:  Percutaneous cholecystostomy tube is again noted. Stable position of internal external biliary drain with distal tip in duodenum. Stable mild left hepatic biliary dilatation with periportal edema. Electronically Signed   By: Lupita Raider M.D.   On: 02/02/2023 13:43    Scheduled Meds:  sodium chloride   Intravenous Once   divalproex  750 mg Oral BID   feeding supplement  237 mL Oral BID BM   multivitamin with minerals  1 tablet Oral Daily   sodium chloride flush  3 mL Intravenous Q12H   sodium chloride flush  5 mL Intracatheter Q8H   urea  15 g Oral BID   Continuous Infusions:  piperacillin-tazobactam (ZOSYN)  IV Stopped (02/03/23 0514)   vancomycin       LOS: 8 days   Burnadette Pop, MD [facility] P12/04/2022, 10:16 AM

## 2023-02-03 NOTE — Progress Notes (Signed)
Patient ID: Samuel Mayo, male   DOB: 07/14/1945, 77 y.o.   MRN: [id number] [facility] Progress Note   Assessment/ Plan:   1. Acute kidney Injury: Appears nonoliguric based on patient account of voiding in bathroom.  Likely hemodynamically mediated in the setting of hypotension with prerenal mechanism/possible mild ATN.  I recommend holding diuretics at this time to allow for increased intravascular volume/renal perfusion and monitoring labs. 2.  Hyponatremia: Acute on chronic with underlying SIADH based on previous labs.  Poor solute intake noted preceding hospitalization that is likely to be contributing to his worsening hyponatremia.  Hold diuretics at this time.  No indication for hypertonic saline. 3.  Biliary obstruction/sepsis: Status post biliary drains that appear to be functioning well and on broad-spectrum antimicrobial therapy.  Clinically improving with plans noted for advanced surgical intervention at [facility] as an outpatient. 4.  Anemia: Secondary to critical illness, monitor trend to decide on need for PRBC transfusion.  Subjective:   Clinical improvement noted overnight, was able to ambulate with assistance to the bathroom/in hallway.   Objective:   BP (!) 92/56   Pulse (!) 107   Temp 98.8 F (37.1 C) (Oral)   Resp 14   Ht 5\' 11"  (1.803 m)   Wt 86.2 kg   SpO2 94%   BMI 26.50 kg/m   Intake/Output Summary (Last 24 hours) at 02/03/2023 0955 Last data filed at 02/03/2023 0800 Gross per 24 hour  Intake 237.35 ml  Output 525 ml  Net -287.65 ml   Weight change:   Physical Exam: Gen: Visibly icteric, resting comfortably in bed.  Wife at bedside CVS: Pulse regular tachycardia, ESM over outflow Resp: Poor inspiratory effort with decreased breath sounds over bases, no rhonchi Abd: Soft, nontender, biliary drains in situ Ext: Trace to 1+ edema  Imaging: DG CHEST PORT 1 VIEW  Result Date: 02/02/2023 CLINICAL DATA:  Fever EXAM: PORTABLE CHEST 1 VIEW  COMPARISON:  12/26/2022 x-ray FINDINGS: Status post median sternotomy. Enlarged cardiopericardial silhouette with calcified tortuous aorta. Prosthetic valve. Overlapping cardiac leads. There is some linear opacity at the bases likely scar or atelectasis. No pneumothorax, effusion or edema. Healed right eighth rib fracture. IMPRESSION: Postop chest with enlarged heart and calcified aorta. Basilar atelectasis Electronically Signed   By: Karen Kays M.D.   On: 02/02/2023 16:08   CT ABDOMEN PELVIS WO CONTRAST  Result Date: 02/02/2023 CLINICAL DATA:  Anemia. EXAM: CT ABDOMEN AND PELVIS WITHOUT CONTRAST TECHNIQUE: Multidetector CT imaging of the abdomen and pelvis was performed following the standard protocol without IV contrast. RADIATION DOSE REDUCTION: This exam was performed according to the departmental dose-optimization program which includes automated exposure control, adjustment of the mA and/or kV according to patient size and/or use of iterative reconstruction technique. COMPARISON:  January 31, 2023. FINDINGS: Lower chest: Minimal bibasilar subsegmental atelectasis is noted. Hepatobiliary: Percutaneous cholecystostomy tube is again noted with decompression of the bladder. Stable position of internal external biliary drain with distal tip in duodenum. Stable mild left hepatic biliary dilatation with periportal edema. Pancreas: Unremarkable. No pancreatic ductal dilatation or surrounding inflammatory changes. Spleen: Normal in size without focal abnormality. Adrenals/Urinary Tract: Adrenal glands are unremarkable. Kidneys are normal, without renal calculi, focal lesion, or hydronephrosis. Bladder is unremarkable. Stomach/Bowel: Stomach is within normal limits. Appendix appears normal. No evidence of bowel wall thickening, distention, or inflammatory changes. Vascular/Lymphatic: Aortic atherosclerosis. No enlarged abdominal or pelvic lymph nodes. Reproductive: Mild prostatic enlargement is noted. Other: No  abdominal wall hernia or abnormality.  No abdominopelvic ascites. Musculoskeletal: Old L1 and L5 fractures are again noted. No acute abnormality seen. IMPRESSION: Percutaneous cholecystostomy tube is again noted. Stable position of internal external biliary drain with distal tip in duodenum. Stable mild left hepatic biliary dilatation with periportal edema. Electronically Signed   By: Lupita Raider M.D.   On: 02/02/2023 13:43    Labs: BMET Recent Labs  Lab 01/29/23 4098 01/30/23 0542 01/30/23 1239 01/30/23 1834 01/31/23 0516 01/31/23 1501 02/01/23 0239 02/02/23 0801 02/03/23 0208  NA 127* 122* 126* 127* 126* 128* 127* 127* 125*  K 3.9 4.1  --   --  3.9 4.1 3.8 3.3* 3.4*  CL 97* 93*  --   --  97* 95* 93* 92* 92*  CO2 19* 17*  --   --  21* 23 26 22 24   GLUCOSE 99 125*  --   --  96 108* 117* 117* 94  BUN 11 14  --   --  33* 24* 25* 25* 56*  CREATININE 0.76 0.85  --   --  0.79 0.89 0.82 1.18 1.67*  CALCIUM 8.2* 8.3*  --   --  8.4* 8.4* 8.6* 8.4* 8.3*   CBC Recent Labs  Lab 01/31/23 0516 02/02/23 0801 02/02/23 1117 02/03/23 0208  WBC 5.0 12.7* 9.6 6.8  HGB 9.5* 6.8* 10.9* 9.1*  HCT 27.7* 19.7* 31.7* 26.6*  MCV 98.2 98.5 100.3* 99.3  PLT 205 250 220 183    Medications:     sodium chloride   Intravenous Once   divalproex  750 mg Oral BID   feeding supplement  237 mL Oral BID BM   multivitamin with minerals  1 tablet Oral Daily   sodium chloride flush  3 mL Intravenous Q12H   sodium chloride flush  5 mL Intracatheter Q8H   urea  15 g Oral BID   Zetta Bills, MD 02/03/2023, 9:55 AM

## 2023-02-03 NOTE — Progress Notes (Signed)
Progress Note   Subjective  Wife and surgical team at bedside. Patient without complaints this AM. Has eaten some of his breakfast. No events reported overnight. Worsening renal function noted today.   Objective   Vital signs in last 24 hours: Temp:  [98.8 F (37.1 C)-99.7 F (37.6 C)] 98.8 F (37.1 C) (12/02 0300) Pulse Rate:  [79-111] 107 (12/02 0300) Resp:  [3-29] 14 (12/02 0300) BP: (73-130)/(49-98) 92/56 (12/02 0300) SpO2:  [92 %-96 %] 94 % (12/02 0300) Last BM Date : 02/02/23 General:    white male in NAD, jaundiced Psych:  Cooperative. Normal mood and affect.  Intake/Output from previous day: 12/01 0701 - 12/02 0700 In: 215 [IV Piggyback:215] Out: 725 [Drains:725] Intake/Output this shift: No intake/output data recorded.  Lab Results: Recent Labs    02/02/23 0801 02/02/23 1117 02/03/23 0208  WBC 12.7* 9.6 6.8  HGB 6.8* 10.9* 9.1*  HCT 19.7* 31.7* 26.6*  PLT 250 220 183   BMET Recent Labs    02/01/23 0239 02/02/23 0801 02/03/23 0208  NA 127* 127* 125*  K 3.8 3.3* 3.4*  CL 93* 92* 92*  CO2 26 22 24   GLUCOSE 117* 117* 94  BUN 25* 25* 56*  CREATININE 0.82 1.18 1.67*  CALCIUM 8.6* 8.4* 8.3*   LFT Recent Labs    02/03/23 0208  PROT 5.2*  ALBUMIN 2.0*  AST 77*  ALT 91*  ALKPHOS 276*  BILITOT 17.0*   PT/INR No results for input(s): "LABPROT", "INR" in the last 72 hours.  Studies/Results: DG CHEST PORT 1 VIEW  Result Date: 02/02/2023 CLINICAL DATA:  Fever EXAM: PORTABLE CHEST 1 VIEW COMPARISON:  12/26/2022 x-ray FINDINGS: Status post median sternotomy. Enlarged cardiopericardial silhouette with calcified tortuous aorta. Prosthetic valve. Overlapping cardiac leads. There is some linear opacity at the bases likely scar or atelectasis. No pneumothorax, effusion or edema. Healed right eighth rib fracture. IMPRESSION: Postop chest with enlarged heart and calcified aorta. Basilar atelectasis Electronically Signed   By: Karen Kays M.D.   On:  02/02/2023 16:08   CT ABDOMEN PELVIS WO CONTRAST  Result Date: 02/02/2023 CLINICAL DATA:  Anemia. EXAM: CT ABDOMEN AND PELVIS WITHOUT CONTRAST TECHNIQUE: Multidetector CT imaging of the abdomen and pelvis was performed following the standard protocol without IV contrast. RADIATION DOSE REDUCTION: This exam was performed according to the departmental dose-optimization program which includes automated exposure control, adjustment of the mA and/or kV according to patient size and/or use of iterative reconstruction technique. COMPARISON:  January 31, 2023. FINDINGS: Lower chest: Minimal bibasilar subsegmental atelectasis is noted. Hepatobiliary: Percutaneous cholecystostomy tube is again noted with decompression of the bladder. Stable position of internal external biliary drain with distal tip in duodenum. Stable mild left hepatic biliary dilatation with periportal edema. Pancreas: Unremarkable. No pancreatic ductal dilatation or surrounding inflammatory changes. Spleen: Normal in size without focal abnormality. Adrenals/Urinary Tract: Adrenal glands are unremarkable. Kidneys are normal, without renal calculi, focal lesion, or hydronephrosis. Bladder is unremarkable. Stomach/Bowel: Stomach is within normal limits. Appendix appears normal. No evidence of bowel wall thickening, distention, or inflammatory changes. Vascular/Lymphatic: Aortic atherosclerosis. No enlarged abdominal or pelvic lymph nodes. Reproductive: Mild prostatic enlargement is noted. Other: No abdominal wall hernia or abnormality. No abdominopelvic ascites. Musculoskeletal: Old L1 and L5 fractures are again noted. No acute abnormality seen. IMPRESSION: Percutaneous cholecystostomy tube is again noted. Stable position of internal external biliary drain with distal tip in duodenum. Stable mild left hepatic biliary dilatation with periportal edema. Electronically Signed  By: Lupita Raider M.D.   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 Leonides Schanz about this patient with workup to date - he has had imaging with biliary ductal dilation, previously attempted ERCP that was not successful due to duodenal diverticulum. Underwent PTC per IR with changes most concerning for possible klatskin tumor. Developed fever, concern for sepsis recently, on antibiotics.  CA 19-9 elevated however not specific in the setting of biliary obstruction, although overall findings concerning for malignancy driving this process.  Had discussion with patient, wife, surgeon at bedside this AM. WBC downtrending, bilirubin decreasing. Drain appears to be functioning, hopefully with time and antibiotics getting better control of infection. We discussed that based on PTC study this is concerning for klatskin tumor driving this process although other imaging has not clearly delineated this. Surgery is not offering an operation here, recommending further evaluation at [facility] / tertiary care facility if he is going to be considered for a surgery for this which would be a complex / major operation. Hopefully with more time with antibiotics / drainage of his obstruction he clinically improves to where he can be discharged and follow up with [facility]. Anticipate he may need an EUS with them to further sort this out and better define the lesion, we do not have that available to [place] this week as inpatient in the hospital. I think okay to continue medical management and follow up with [facility] in the near future for further management of this issue, once discharged from the hospital. They agree.   Nothing further to add at this point from GI perspective, supportive care / antibiotics, encourage good nutrition, hopefully discharge in upcoming days.   Call with questions we will otherwise sign off for now.  Harlin Rain, MD [facility]

## 2023-02-04 DIAGNOSIS — K831 Obstruction of bile duct: Secondary | ICD-10-CM | POA: Diagnosis not present

## 2023-02-04 LAB — COMPREHENSIVE METABOLIC PANEL
ALT: 74 U/L — ABNORMAL HIGH (ref 0–44)
AST: 64 U/L — ABNORMAL HIGH (ref 15–41)
Albumin: 1.7 g/dL — ABNORMAL LOW (ref 3.5–5.0)
Alkaline Phosphatase: 213 U/L — ABNORMAL HIGH (ref 38–126)
Anion gap: 10 (ref 5–15)
BUN: 70 mg/dL — ABNORMAL HIGH (ref 8–23)
CO2: 23 mmol/L (ref 22–32)
Calcium: 8.5 mg/dL — ABNORMAL LOW (ref 8.9–10.3)
Chloride: 96 mmol/L — ABNORMAL LOW (ref 98–111)
Creatinine, Ser: 1.66 mg/dL — ABNORMAL HIGH (ref 0.61–1.24)
GFR, Estimated: 42 mL/min — ABNORMAL LOW (ref >60)
Glucose, Bld: 102 mg/dL — ABNORMAL HIGH (ref 70–99)
Potassium: 3.8 mmol/L (ref 3.5–5.1)
Sodium: 129 mmol/L — ABNORMAL LOW (ref 135–145)
Total Bilirubin: 14.8 mg/dL — ABNORMAL HIGH (ref <1.2)
Total Protein: 5 g/dL — ABNORMAL LOW (ref 6.5–8.1)

## 2023-02-04 NOTE — Plan of Care (Signed)

## 2023-02-04 NOTE — Progress Notes (Signed)
Patient ID: Samuel Mayo, male   DOB: 1946/02/27, 77 y.o.   MRN: [id number] [facility] Progress Note   Assessment/ Plan:   1. Acute kidney Injury: Oliguric and clinically suspected to be ATN from relative hypotension.  Creatinine essentially unchanged overnight possibly indicating the plateau phase of ATN (BUN rise secondary to UreNa supplementation that I will discontinue).  No acute indications noted for dialysis. 2.  Hyponatremia: Acute on chronic with underlying SIADH based on previous labs with poor solute intake secondary to abdominal pain likely contributing to worsening hyponatremia.  Overnight with some improvement of sodium level possibly in parallel with increasing urine output/UreNa supplement. 3.  Biliary obstruction/sepsis: Status post biliary drains that appear to be functioning well and on broad-spectrum antimicrobial therapy.  Clinically improving with plans noted for advanced surgical intervention at [facility] as an outpatient. 4.  Anemia: Secondary to critical illness, monitor trend to decide on need for PRBC transfusion.  Subjective:   No acute changes overnight, denies any complaint and states that "I am peeing plenty"   Objective:   BP 93/69 (BP Location: Right Arm)   Pulse 94   Temp 97.6 F (36.4 C) (Oral)   Resp 17   Ht 5\' 11"  (1.803 m)   Wt 86.2 kg   SpO2 97%   BMI 26.50 kg/m   Intake/Output Summary (Last 24 hours) at 02/04/2023 0815 Last data filed at 02/04/2023 0800 Gross per 24 hour  Intake 1477.97 ml  Output 3245 ml  Net -1767.03 ml   Weight change:   Physical Exam: Gen: Icteric man resting comfortably in recliner, biliary drains being emptied. CVS: Pulse regular rhythm, normal rate, ESM audible over outflow tract Resp: Poor inspiratory effort with decreased breath sounds over bases, no rhonchi Abd: Soft, nontender, biliary drains in situ Ext: Trace to 1+ edema over lower extremities  Imaging: DG CHEST PORT 1 VIEW  Result Date:  02/02/2023 CLINICAL DATA:  Fever EXAM: PORTABLE CHEST 1 VIEW COMPARISON:  12/26/2022 x-ray FINDINGS: Status post median sternotomy. Enlarged cardiopericardial silhouette with calcified tortuous aorta. Prosthetic valve. Overlapping cardiac leads. There is some linear opacity at the bases likely scar or atelectasis. No pneumothorax, effusion or edema. Healed right eighth rib fracture. IMPRESSION: Postop chest with enlarged heart and calcified aorta. Basilar atelectasis Electronically Signed   By: Karen Kays M.D.   On: 02/02/2023 16:08   CT ABDOMEN PELVIS WO CONTRAST  Result Date: 02/02/2023 CLINICAL DATA:  Anemia. EXAM: CT ABDOMEN AND PELVIS WITHOUT CONTRAST TECHNIQUE: Multidetector CT imaging of the abdomen and pelvis was performed following the standard protocol without IV contrast. RADIATION DOSE REDUCTION: This exam was performed according to the departmental dose-optimization program which includes automated exposure control, adjustment of the mA and/or kV according to patient size and/or use of iterative reconstruction technique. COMPARISON:  January 31, 2023. FINDINGS: Lower chest: Minimal bibasilar subsegmental atelectasis is noted. Hepatobiliary: Percutaneous cholecystostomy tube is again noted with decompression of the bladder. Stable position of internal external biliary drain with distal tip in duodenum. Stable mild left hepatic biliary dilatation with periportal edema. Pancreas: Unremarkable. No pancreatic ductal dilatation or surrounding inflammatory changes. Spleen: Normal in size without focal abnormality. Adrenals/Urinary Tract: Adrenal glands are unremarkable. Kidneys are normal, without renal calculi, focal lesion, or hydronephrosis. Bladder is unremarkable. Stomach/Bowel: Stomach is within normal limits. Appendix appears normal. No evidence of bowel wall thickening, distention, or inflammatory changes. Vascular/Lymphatic: Aortic atherosclerosis. No enlarged abdominal or pelvic lymph nodes.  Reproductive: Mild prostatic enlargement is noted. Other:  No abdominal wall hernia or abnormality. No abdominopelvic ascites. Musculoskeletal: Old L1 and L5 fractures are again noted. No acute abnormality seen. IMPRESSION: Percutaneous cholecystostomy tube is again noted. Stable position of internal external biliary drain with distal tip in duodenum. Stable mild left hepatic biliary dilatation with periportal edema. Electronically Signed   By: Lupita Raider M.D.   On: 02/02/2023 13:43    Labs: BMET Recent Labs  Lab 01/30/23 0542 01/30/23 1239 01/30/23 1834 01/31/23 0516 01/31/23 1501 02/01/23 0239 02/02/23 0801 02/03/23 0208 02/04/23 0216  NA 122*   < > 127* 126* 128* 127* 127* 125* 129*  K 4.1  --   --  3.9 4.1 3.8 3.3* 3.4* 3.8  CL 93*  --   --  97* 95* 93* 92* 92* 96*  CO2 17*  --   --  21* 23 26 22 24 23   GLUCOSE 125*  --   --  96 108* 117* 117* 94 102*  BUN 14  --   --  33* 24* 25* 25* 56* 70*  CREATININE 0.85  --   --  0.79 0.89 0.82 1.18 1.67* 1.66*  CALCIUM 8.3*  --   --  8.4* 8.4* 8.6* 8.4* 8.3* 8.5*   < > = values in this interval not displayed.   CBC Recent Labs  Lab 01/31/23 0516 02/02/23 0801 02/02/23 1117 02/03/23 0208  WBC 5.0 12.7* 9.6 6.8  HGB 9.5* 6.8* 10.9* 9.1*  HCT 27.7* 19.7* 31.7* 26.6*  MCV 98.2 98.5 100.3* 99.3  PLT 205 250 220 183    Medications:     sodium chloride   Intravenous Once   divalproex  750 mg Oral BID   feeding supplement  237 mL Oral BID BM   multivitamin with minerals  1 tablet Oral Daily   sodium chloride flush  3 mL Intravenous Q12H   sodium chloride flush  5 mL Intracatheter Q8H   urea  15 g Oral BID   Zetta Bills, MD 02/04/2023, 8:15 AM

## 2023-02-04 NOTE — Progress Notes (Signed)
Referring Physician(s): Dr Angelia Mould   Supervising Physician: Richarda Overlie  Patient Status:  [facility] - In-pt  Chief Complaint:  S/p perc chole placement on 10/31 F/u cholangiogram on 11/22 showed  persistent occlusion of the cystic duct without opacification of the CBD Admitted on 11/22 with elevated T bili and jaundice, ERCP was unsuccessful  S/p int/ext bili drain placement on 11/29  Subjective:  Patient laying in bed, NAD. Reports that he is doing ok.  Denies abd pain, N/V. He was told that the yellowness of his skin is getting better by other people.   Allergies: Other  Medications: Prior to Admission medications   Medication Sig Start Date End Date Taking? Authorizing Provider  amLODipine (NORVASC) 5 MG tablet TAKE 1 TABLET (5 MG TOTAL) BY MOUTH DAILY. 01/11/22  Yes [name], [name], MD  Cholecalciferol (VITAMIN D-3 PO) Take 1 capsule by mouth every evening.   Yes [provider]  divalproex (DEPAKOTE) 250 MG DR tablet Take 1 tablet (250 mg total) by mouth 2 (two) times daily. Take in addition to 500 mg twice a day (pt should have a total of 750 mg twice a day) 08/26/22  Yes Micki Riley, MD  divalproex (DEPAKOTE) 500 MG DR tablet Take 1 tablet (500 mg total) by mouth 2 (two) times daily. 05/20/22  Yes Micki Riley, MD  ELIQUIS 5 MG TABS tablet TAKE 1 TABLET BY MOUTH TWICE A DAY 08/07/22  Yes Mealor, Roberts Gaudy, MD  gabapentin (NEURONTIN) 300 MG capsule Take 1 capsule (300 mg total) by mouth at bedtime. 09/16/22  Yes Micki Riley, MD  ibandronate (BONIVA) 150 MG tablet TAKE 1 TABLET BY MOUTH EVERY 30 DAYS. TAKE IN AM WITH FULL GLASS OF WATER ON EMPTY STOMACH AND DON'T TAKE ANYTHING ELSE BY MOUTH OR LIE DOWN FOR THE NEXT 30 MINUTES Patient taking differently: Take 150 mg by mouth See admin instructions. Take 1 tablet (150mg ) once a month around the 20th. 07/22/22  Yes [name], [name], MD  LUTEIN PO Take 1 tablet by mouth every evening.   Yes [provider]   metoprolol succinate (TOPROL-XL) 25 MG 24 hr tablet TAKE 1 TABLET (25 MG TOTAL) BY MOUTH DAILY. 09/23/22  Yes BranchAlben Spittle, MD  Multiple Vitamin (MULTIVITAMIN WITH MINERALS) TABS tablet Take 1 tablet by mouth every evening.   Yes [provider]  OVER THE COUNTER MEDICATION Take 1 tablet by mouth 2 (two) times daily. Bone Up   Yes [provider]  amoxicillin-clavulanate (AUGMENTIN) 875-125 MG tablet Take 1 tablet by mouth 2 (two) times daily. Patient not taking: Reported on 01/24/2023    [provider]     Vital Signs: BP 103/65 (BP Location: Right Arm)   Pulse 78   Temp (!) 97.5 F (36.4 C) (Oral)   Resp (!) 23   Ht 5\' 11"  (1.803 m)   Wt 190 lb (86.2 kg)   SpO2 98%   BMI 26.50 kg/m   Physical Exam Vitals reviewed.  Constitutional:      General: He is not in acute distress.    Appearance: He is not ill-appearing.  HENT:     Head: Normocephalic and atraumatic.  Abdominal:     General: Abdomen is flat.     Palpations: Abdomen is soft.     Tenderness: There is no abdominal tenderness.     Comments: Positive RUQ, more anterior drain to a gravity bag. Site is unremarkable with no erythema, edema, tenderness, bleeding  or drainage. Suture and stat lock in place. Dressing is clean, dry, and intact. Trace ml of  simple bile noted in the bag. Drain flushes well.   Positive RUQ drain, more posterior  to a gravity bag. Site with small amount of leak, bilious. No active leak with flushing. Otherwise unremarkable with no erythema, edema, tenderness, or bleeding. Suture and stat lock in place. Dressing with small bilious fluid on. Flushes well.   Neurological:     Mental Status: He is alert.     Imaging: DG CHEST PORT 1 VIEW  Result Date: 02/02/2023 CLINICAL DATA:  Fever EXAM: PORTABLE CHEST 1 VIEW COMPARISON:  12/26/2022 x-ray FINDINGS: Status post median sternotomy. Enlarged cardiopericardial silhouette with calcified tortuous aorta. Prosthetic valve.  Overlapping cardiac leads. There is some linear opacity at the bases likely scar or atelectasis. No pneumothorax, effusion or edema. Healed right eighth rib fracture. IMPRESSION: Postop chest with enlarged heart and calcified aorta. Basilar atelectasis Electronically Signed   By: Karen Kays M.D.   On: 02/02/2023 16:08   CT ABDOMEN PELVIS WO CONTRAST  Result Date: 02/02/2023 CLINICAL DATA:  Anemia. EXAM: CT ABDOMEN AND PELVIS WITHOUT CONTRAST TECHNIQUE: Multidetector CT imaging of the abdomen and pelvis was performed following the standard protocol without IV contrast. RADIATION DOSE REDUCTION: This exam was performed according to the departmental dose-optimization program which includes automated exposure control, adjustment of the mA and/or kV according to patient size and/or use of iterative reconstruction technique. COMPARISON:  January 31, 2023. FINDINGS: Lower chest: Minimal bibasilar subsegmental atelectasis is noted. Hepatobiliary: Percutaneous cholecystostomy tube is again noted with decompression of the bladder. Stable position of internal external biliary drain with distal tip in duodenum. Stable mild left hepatic biliary dilatation with periportal edema. Pancreas: Unremarkable. No pancreatic ductal dilatation or surrounding inflammatory changes. Spleen: Normal in size without focal abnormality. Adrenals/Urinary Tract: Adrenal glands are unremarkable. Kidneys are normal, without renal calculi, focal lesion, or hydronephrosis. Bladder is unremarkable. Stomach/Bowel: Stomach is within normal limits. Appendix appears normal. No evidence of bowel wall thickening, distention, or inflammatory changes. Vascular/Lymphatic: Aortic atherosclerosis. No enlarged abdominal or pelvic lymph nodes. Reproductive: Mild prostatic enlargement is noted. Other: No abdominal wall hernia or abnormality. No abdominopelvic ascites. Musculoskeletal: Old L1 and L5 fractures are again noted. No acute abnormality seen.  IMPRESSION: Percutaneous cholecystostomy tube is again noted. Stable position of internal external biliary drain with distal tip in duodenum. Stable mild left hepatic biliary dilatation with periportal edema. Electronically Signed   By: Lupita Raider M.D.   On: 02/02/2023 13:43   CT ABDOMEN PELVIS W CONTRAST  Result Date: 01/31/2023 CLINICAL DATA:  Gallbladder and biliary cancer. EXAM: CT ABDOMEN AND PELVIS WITH CONTRAST TECHNIQUE: Multidetector CT imaging of the abdomen and pelvis was performed using the standard protocol following bolus administration of intravenous contrast. RADIATION DOSE REDUCTION: This exam was performed according to the departmental dose-optimization program which includes automated exposure control, adjustment of the mA and/or kV according to patient size and/or use of iterative reconstruction technique. CONTRAST:  75mL OMNIPAQUE IOHEXOL 350 MG/ML SOLN COMPARISON:  MRI abdomen 01/24/2023. FINDINGS: Lower chest: There is atelectasis in the lung bases with trace bilateral pleural effusions. Hepatobiliary: Transhepatic percutaneous biliary catheter present with distal tip in the duodenal. There is mild intrahepatic biliary ductal dilatation in the left lobe of the liver with some periportal edema similar to prior MRI given differences in technique. There is a small amount of pneumobilia and contrast throughout the biliary tree likely related to recent  procedure. No focal liver lesions are seen. Percutaneous cholecystostomy tube present. There some contrast in the gallbladder. The gallbladder is decompressed and there is diffuse wall thickening similar to prior. Pancreas: Unremarkable. No pancreatic ductal dilatation or surrounding inflammatory changes. Spleen: Normal in size without focal abnormality. Adrenals/Urinary Tract: Adrenal glands are unremarkable. Kidneys are normal, without renal calculi, focal lesion, or hydronephrosis. Bladder is unremarkable. Stomach/Bowel: Stomach is  within normal limits. Appendix appears normal. No evidence of bowel wall thickening, distention, or inflammatory changes. There is sigmoid colon diverticulosis. Vascular/Lymphatic: No significant vascular findings are present. No enlarged abdominal or pelvic lymph nodes. Reproductive: Prostate gland is enlarged. Other: There is presacral edema. There is trace free fluid in the right upper quadrant similar to prior. There is no focal abdominal wall hernia. Musculoskeletal: There are compression deformities of T8, T9, T11, L1 and L5 which are favored as chronic, but age indeterminate. The bones are diffusely osteopenic. There is mild body wall edema. IMPRESSION: 1. Transhepatic percutaneous biliary catheter present with distal tip in the duodenum. 2. Percutaneous cholecystostomy tube present. Gallbladder is decompressed with diffuse wall thickening similar to prior. 3. Mild intrahepatic biliary ductal dilatation in the left lobe of the liver with periportal edema similar to prior MRI. 4. Trace free fluid in the right upper quadrant similar to prior. 5. Trace bilateral pleural effusions. 6. Mild body wall edema. 7. Multiple compression deformities of the thoracic and lumbar spine are favored as chronic, but age indeterminate. Correlate clinically. Electronically Signed   By: Darliss Cheney M.D.   On: 01/31/2023 20:37    Labs:  CBC: Recent Labs    01/31/23 0516 02/02/23 0801 02/02/23 1117 02/03/23 0208  WBC 5.0 12.7* 9.6 6.8  HGB 9.5* 6.8* 10.9* 9.1*  HCT 27.7* 19.7* 31.7* 26.6*  PLT 205 250 220 183    COAGS: Recent Labs    12/30/22 2151 12/31/22 0705 12/31/22 1747 01/01/23 1154 01/25/23 0548 01/31/23 0516  INR 1.3*  --   --   --  1.2 1.1  APTT 36 34 61* 43*  --   --     BMP: Recent Labs    02/01/23 0239 02/02/23 0801 02/03/23 0208 02/04/23 0216  NA 127* 127* 125* 129*  K 3.8 3.3* 3.4* 3.8  CL 93* 92* 92* 96*  CO2 26 22 24 23   GLUCOSE 117* 117* 94 102*  BUN 25* 25* 56* 70*   CALCIUM 8.6* 8.4* 8.3* 8.5*  CREATININE 0.82 1.18 1.67* 1.66*  GFRNONAA >60 >60 42* 42*    LIVER FUNCTION TESTS: Recent Labs    02/01/23 0239 02/02/23 0801 02/03/23 0208 02/04/23 0216  BILITOT 21.9* 18.1* 17.0* 14.8*  AST 177* 105* 77* 64*  ALT 156* 116* 91* 74*  ALKPHOS 504* 391* 276* 213*  PROT 5.7* 5.2* 5.2* 5.0*  ALBUMIN 2.1* 1.9* 2.0* 1.7*    Assessment and Plan:  77 y.o. male with acute cholecystitis s/p perc chole placement on 10/31, cholangiogram on 11/22 showed  persistent occlusion of the cystic duct without opacification of the CBD, admitted on 11/22 with elevated T bili and jaundice, ERCP was unsuccessful, s/p int/ext bili drain placement on 11/29.   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 Allen(hepatobiliary surgery) as an outpatient at [facility].   Drain Location: RUQ- more anterior, per chole  Size: Fr size: 10 Fr Date of placement: 10/31, exchange on  11/22   Currently to: Drain collection device: gravity  Drain Location: RUQ- more posterior, int/ext bili drain  Size: Fr size: 10 Fr Date of placement: 11/29 Currently to: Drain collection device: gravity 24 hour output:  Output by Drain (mL) 02/02/23 0701 - 02/02/23 1900 02/02/23 1901 - 02/03/23 0700 02/03/23 0701 - 02/03/23 1900 02/03/23 1901 - 02/04/23 0700 02/04/23 0701 - 02/04/23 1434  Biliary Tube Cook slip-coat 10 Fr. RUQ 0  50 20 0  Biliary Tube Cook slip-coat 12 Fr. RUQ 200 525 525 675 450    Interval imaging/drain manipulation:  12/1: CT AP w/o - obtained due to anemia  Percutaneous cholecystostomy tube is again noted. Stable position of internal external biliary drain with distal tip in duodenum. Stable mild left hepatic biliary dilatation with periportal edema.  Current examination: Flushes easily.  Bili drain insertion site with bilious leak, perc chole  insertion site clean and dry  Suture and stat lock in place. Dressed appropriately.   Plan: Discussing the leak around the bili drain. There has been discussion that patient may need left bili drain is T bili does not drop or plateaus, may benefit from left sided bili drain for better decompression.  IR will follow.  Continue TID flushes with 5 cc NS. Record output Q shift. Dressing changes QD or PRN if soiled.  Call IR APP or on call IR MD if difficulty flushing or sudden change in drain output.  Repeat imaging/possible drain injection once output < 10 mL/QD (excluding flush material). Consideration for drain removal if output is < 10 mL/QD (excluding flush material), pending discussion with the providing surgical service.  Discharge planning: Please contact IR APP or on call IR MD prior to patient d/c to ensure appropriate follow up plans are in place. Typically patient will follow up with IR clinic 10-14 days post d/c for repeat imaging/possible drain injection. IR scheduler will contact patient with date/time of appointment. Patient will need to flush drain QD with 5 cc NS, record output QD, dressing changes every 2-3 days or earlier if soiled.   IR will continue to follow - please call with questions or concerns.   Electronically Signed: Willette Brace, PA-C 02/04/2023, 2:13 PM   I spent a total of 15 Minutes at the the patient's bedside AND on the patient's hospital floor or unit, greater than 50% of which was counseling/coordinating care for perc chole and int/ext bili drain f/u.   This chart was dictated using voice recognition software.  Despite best efforts to proofread,  errors can occur which can change the documentation meaning.

## 2023-02-04 NOTE — Progress Notes (Signed)
Mobility Specialist Progress Note:    02/04/23 1500  Mobility  Activity Refused mobility  Mobility Specialist Start Time (ACUTE ONLY) 1428   Pt refused mobility d/t fatigue and wanting to take a nap. Will f/u as able.    D'Vante Earlene Plater Mobility Specialist Please contact via Special educational needs teacher or Rehab office at [phone]

## 2023-02-04 NOTE — Progress Notes (Signed)
PROGRESS NOTE  Samuel Mayo  ZOX:[id number] DOB: 11-23-1945 DOA: 01/24/2023 PCP: [name], [name], MD   Brief Narrative: Patient is a 77 year old male with history of hypertension, atrial fibrillation, seizures, gout, MVR s/p angioplasty who presented with worsening jaundice.  Patient was recently admitted from 10/28 -11/1 for cholecystitis, diagnosed with Mirizzi syndrome ,underwent cholecystostomy.  Follow-up imaging showed persistent occlusion of the cystic duct, elevated liver enzymes so recommended to go to the emergency department.  Patient also  reported decreased appetite, weakness.  On presentation, he was hemodynamically stable.  Lab work showed sodium of 127, lipase of 1228, elevated liver enzymes, bilirubin of 20.  General surgery, GI consulted.  Attempted  ERCP but turned out to be unsuccessful.  IR did PTC with biliary drain placement on 11/29.  On 12/1,patient became febrile last night, hypotensive, tachycardic.  Underlying sepsis suspected.  Critical care , palliaitive care consulted.  Now clinically improving.  Current plan is to continue IV antibiotics, follow-up cultures, allowing improvement in the liver function.  Assessment & Plan:  Principal Problem:   Obstructive jaundice Active Problems:   Atrial fibrillation, chronic (HCC)   Hypertension   Seizure (HCC)   Cholecystitis   Acute biliary pancreatitis   Malnutrition of moderate degree   Hyperbilirubinemia   DNR (do not resuscitate)   DNR (do not resuscitate) discussion   Obstructive jaundice: Initially admitted on 10/28 when he presented with jaundice.  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  Underwent percutaneous cholecystomy tube placement on 10/31 and was discharged home with oral antibiotics.  Outpatient labs showed persistently elevated liver enzymes. Drain study on 11/22 by IR showed persistent occlusion of cystic duct and no opacification of CBD.  MRCP showed dilated  intrahepatic bile duct with thickening of the extrahepatic bile duct, suspicious for cholangitis.  Continues to have severe elevated bilirubin, alkaline phosphatase, hyponatremia.  General surgery, GI consulted.  Currently on Unasyn. Denies abdomen pain, nausea or vomiting today.  Remains intensely jaundiced. Attempted  ERCP on 11/27  but turned out to be unsuccessful.   IR did PTC with biliary drain placement on 11/29.  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    He will follow-up with Dr. Allen(hepatobiliary surgery) as an  outpatient at [facility].  IR might put left-sided drain if bilirubin does not trend down significantly.  Sepsis: Became febrile since the night of 11/30, developed leukocytosis.  Lactate level normal.  Patient became lethargic.  Underlying sepsis possible.  CT abdomen/pelvis ordered without finding of any acute problem.  Continue broad spectrum antibiotics.  Culture ordered, no growth till date.  He is afebrile this morning.  No leukocytosis.  Clinically improved.  Blood pressure better.  Hyponatremia: Baseline sodium runs low in the range of 120-132.  Sodium dropped to the range of 122, prompting nephrology consult.  Started on urea, Lasix now stopped.  Likely SIADH.  Sodium is stable in the range of 120s.  Lasix discontinued  AKI: Likely hemodynamically mediated, patient was hypotensive yesterday.  Continue to monitor.  Nephrology following  Hypertension: On amlodipine, Toprol at home.  Currently on hold  Paroxysmal A-fib: On metoprolol.  Continue to monitor on telemetry.  Eliquis on hold pending further plan from IR.  Remains in normal sinus rhythm at present.  Seizure disorder: On Depakote.  Elevated liver enzymes is not from Depakote but from obstructive liver disease.  History of mitral valve regurgitation: Status post annuloplasty.  Follow-up with cardiology as an outpatient  Goals of care: Palliative care, critical care consulted.  Patient might  have underlying biliary tumor which looks like unresectable as per general surgery.  Poor prognosis.  CODE STATUS changed to DNR.  Might be a candidate for hospice/comfort care if further declines.       Nutrition Problem: Moderate Malnutrition Etiology: acute illness    DVT prophylaxis:SCDs Start: 01/24/23 1337     Code Status: Limited: Do not attempt resuscitation (DNR) -DNR-LIMITED -Do Not Intubate/DNI   Family Communication: Significant other at bedside on 12/3   Patient status:Inpatient  Patient is from :Home  Anticipated discharge to: home  Estimated DC date: 1-2 days    Consultants: GI, general surgery, PCCM, palliative care  Procedures: ERCP, PTC  Antimicrobials:  Anti-infectives (From admission, onward)    Start     Dose/Rate Route Frequency Ordered Stop   02/04/23 1000  vancomycin (VANCOCIN) IVPB 1000 mg/200 mL premix        1,000 mg 200 mL/hr over 60 Minutes Intravenous Every 24 hours 02/03/23 1122     02/03/23 1000  Vancomycin (VANCOCIN) 1,500 mg in sodium chloride 0.9 % 500 mL IVPB  Status:  Discontinued        1,500 mg 250 mL/hr over 120 Minutes Intravenous Every 24 hours 02/02/23 1006 02/03/23 1122   02/02/23 2100  vancomycin (VANCOCIN) IVPB 1000 mg/200 mL premix  Status:  Discontinued        1,000 mg 200 mL/hr over 60 Minutes Intravenous Every 12 hours 02/02/23 0811 02/02/23 1006   02/02/23 1100  vancomycin (VANCOCIN) IVPB 1000 mg/200 mL premix       Placed in "Followed by" Linked Group   1,000 mg 200 mL/hr over 60 Minutes Intravenous  Once 02/02/23 0913 02/02/23 1500   02/02/23 1000  vancomycin (VANCOCIN) IVPB 1000 mg/200 mL premix       Placed in "Followed by" Linked Group   1,000 mg 200 mL/hr over 60 Minutes Intravenous  Once 02/02/23 0913 02/02/23 1340   02/02/23 0900  vancomycin (VANCOREADY) IVPB 2000 mg/400 mL  Status:  Discontinued        2,000 mg 200 mL/hr over 120 Minutes Intravenous  Once 02/02/23 0804 02/02/23 0913   02/02/23 0900   piperacillin-tazobactam (ZOSYN) IVPB 3.375 g        3.375 g 12.5 mL/hr over 240 Minutes Intravenous Every 8 hours 02/02/23 0805     02/01/23 2200  amoxicillin-clavulanate (AUGMENTIN) 875-125 MG per tablet 1 tablet  Status:  Discontinued        1 tablet Oral Every 12 hours 02/01/23 1150 02/02/23 0738   01/31/23 1058  cefOXitin (MEFOXIN) 2 g in sodium chloride 0.9 % 100 mL IVPB        over 30 Minutes  Continuous PRN 01/31/23 1058 01/31/23 1058   01/31/23 0000  cefOXitin (MEFOXIN) 2 g in sodium chloride 0.9 % 100 mL IVPB  Status:  Discontinued        2 g 200 mL/hr over 30 Minutes Intravenous To Radiology 01/30/23 1014 01/30/23 1015   01/25/23 1600  ampicillin-sulbactam (UNASYN) 1.5 g in sodium chloride 0.9 % 100 mL IVPB  Status:  Discontinued        1.5 g 200 mL/hr over 30 Minutes Intravenous Every 6 hours 01/25/23 1525 02/01/23 1150       Subjective: Patient seen and examined at bedside today.  Looks comfortable.  Lying in bed.  Feels better than yesterday.  Eating food.  Denies any abdomen pain, nausea or vomiting.  Afebrile this morning.  Eager to go home.  But we discussed about keeping him here at least 1-2 more days before discharge plan  Objective: Vitals:   02/04/23 0400 02/04/23 0500 02/04/23 0600 02/04/23 0700  BP: 106/69 91/69  93/69  Pulse: 89 83 86 94  Resp: 15 19 (!) 22 17  Temp:    97.6 F (36.4 C)  TempSrc:    Oral  SpO2: 96% 93% 95% 97%  Weight:      Height:        Intake/Output Summary (Last 24 hours) at 02/04/2023 1105 Last data filed at 02/04/2023 1000 Gross per 24 hour  Intake 1505.65 ml  Output 3495 ml  Net -1989.35 ml   Filed Weights   01/24/23 0953  Weight: 86.2 kg    Examination:  General exam: Overall comfortable, not in distress, weak/deconditioned HEENT: PERRL Respiratory system: Diminished sounds bilaterally but no wheezes or crackles Cardiovascular system: S1 & S2 heard, RRR.  Gastrointestinal system: Abdomen is nondistended, soft and  nontender.  2 right-sided drains Central nervous system: Alert and oriented Extremities: No edema, no clubbing ,no cyanosis Skin: jaundice   Data Reviewed: I have personally reviewed following labs and imaging studies  CBC: Recent Labs  Lab 01/31/23 0516 02/02/23 0801 02/02/23 1117 02/03/23 0208  WBC 5.0 12.7* 9.6 6.8  HGB 9.5* 6.8* 10.9* 9.1*  HCT 27.7* 19.7* 31.7* 26.6*  MCV 98.2 98.5 100.3* 99.3  PLT 205 250 220 183   Basic Metabolic Panel: Recent Labs  Lab 01/31/23 1501 02/01/23 0239 02/02/23 0801 02/03/23 0208 02/04/23 0216  NA 128* 127* 127* 125* 129*  K 4.1 3.8 3.3* 3.4* 3.8  CL 95* 93* 92* 92* 96*  CO2 23 26 22 24 23   GLUCOSE 108* 117* 117* 94 102*  BUN 24* 25* 25* 56* 70*  CREATININE 0.89 0.82 1.18 1.67* 1.66*  CALCIUM 8.4* 8.6* 8.4* 8.3* 8.5*     Recent Results (from the past 240 hour(s))  Culture, blood (Routine X 2) w Reflex to ID Panel     Status: None (Preliminary result)   Collection Time: 02/02/23  8:03 AM   Specimen: BLOOD LEFT ARM  Result Value Ref Range Status   Specimen Description BLOOD LEFT ARM  Final   Special Requests   Final    BOTTLES DRAWN AEROBIC AND ANAEROBIC Blood Culture results may not be optimal due to an inadequate volume of blood received in culture bottles   Culture   Final    NO GROWTH 2 DAYS Performed at [facility], [address], [place], [place] [address]    Report Status PENDING  Incomplete  Culture, blood (Routine X 2) w Reflex to ID Panel     Status: None (Preliminary result)   Collection Time: 02/02/23  8:04 AM   Specimen: BLOOD LEFT ARM  Result Value Ref Range Status   Specimen Description BLOOD LEFT ARM  Final   Special Requests   Final    BOTTLES DRAWN AEROBIC AND ANAEROBIC Blood Culture results may not be optimal due to an inadequate volume of blood received in culture bottles   Culture   Final    NO GROWTH 2 DAYS Performed at [facility], [address], [place], [place] [address]     Report Status PENDING  Incomplete     Radiology Studies: DG CHEST PORT 1 VIEW  Result Date: 02/02/2023 CLINICAL DATA:  Fever EXAM: PORTABLE CHEST 1 VIEW COMPARISON:  12/26/2022 x-ray FINDINGS: Status post  median sternotomy. Enlarged cardiopericardial silhouette with calcified tortuous aorta. Prosthetic valve. Overlapping cardiac leads. There is some linear opacity at the bases likely scar or atelectasis. No pneumothorax, effusion or edema. Healed right eighth rib fracture. IMPRESSION: Postop chest with enlarged heart and calcified aorta. Basilar atelectasis Electronically Signed   By: Karen Kays M.D.   On: 02/02/2023 16:08   CT ABDOMEN PELVIS WO CONTRAST  Result Date: 02/02/2023 CLINICAL DATA:  Anemia. EXAM: CT ABDOMEN AND PELVIS WITHOUT CONTRAST TECHNIQUE: Multidetector CT imaging of the abdomen and pelvis was performed following the standard protocol without IV contrast. RADIATION DOSE REDUCTION: This exam was performed according to the departmental dose-optimization program which includes automated exposure control, adjustment of the mA and/or kV according to patient size and/or use of iterative reconstruction technique. COMPARISON:  January 31, 2023. FINDINGS: Lower chest: Minimal bibasilar subsegmental atelectasis is noted. Hepatobiliary: Percutaneous cholecystostomy tube is again noted with decompression of the bladder. Stable position of internal external biliary drain with distal tip in duodenum. Stable mild left hepatic biliary dilatation with periportal edema. Pancreas: Unremarkable. No pancreatic ductal dilatation or surrounding inflammatory changes. Spleen: Normal in size without focal abnormality. Adrenals/Urinary Tract: Adrenal glands are unremarkable. Kidneys are normal, without renal calculi, focal lesion, or hydronephrosis. Bladder is unremarkable. Stomach/Bowel: Stomach is within normal limits. Appendix appears normal. No evidence of bowel wall thickening, distention, or inflammatory  changes. Vascular/Lymphatic: Aortic atherosclerosis. No enlarged abdominal or pelvic lymph nodes. Reproductive: Mild prostatic enlargement is noted. Other: No abdominal wall hernia or abnormality. No abdominopelvic ascites. Musculoskeletal: Old L1 and L5 fractures are again noted. No acute abnormality seen. IMPRESSION: Percutaneous cholecystostomy tube is again noted. Stable position of internal external biliary drain with distal tip in duodenum. Stable mild left hepatic biliary dilatation with periportal edema. Electronically Signed   By: Lupita Raider M.D.   On: 02/02/2023 13:43    Scheduled Meds:  sodium chloride   Intravenous Once   divalproex  750 mg Oral BID   feeding supplement  237 mL Oral BID BM   multivitamin with minerals  1 tablet Oral Daily   sodium chloride flush  3 mL Intravenous Q12H   sodium chloride flush  5 mL Intracatheter Q8H   Continuous Infusions:  piperacillin-tazobactam (ZOSYN)  IV 3.375 g (02/04/23 1610)   vancomycin       LOS: 9 days   Burnadette Pop, MD [facility] P12/05/2022, 11:05 AM

## 2023-02-04 NOTE — Progress Notes (Signed)
Patient ID: Samuel Mayo, male   DOB: 10-09-1945, 77 y.o.   MRN: [id number]    Progress Note from the Palliative Medicine Team at [facility]   Patient Name: Samuel Mayo        Date: 02/04/2023 DOB: 1946-02-20  Age: 77 y.o. MRN#: [id number] Attending Physician: Burnadette Pop, MD Primary Care Physician: [name], [name], MD Admit Date: 01/24/2023   Reason for Consultation/Follow-up   Establishing Goals of Care   HPI/ Brief Hospital Review  Patient is a 77 year old male with history of hypertension, atrial fibrillation, seizures, gout, MVR s/p angioplasty who presented with worsening jaundice.  Patient was recently admitted from 10/28 -11/1 for cholecystitis, diagnosed with Mirizzi syndrome ,underwent cholecystostomy.  Follow-up imaging showed persistent occlusion of the cystic duct, elevated liver enzymes so recommended to go to the emergency department.  Patient also  reported decreased appetite, weakness.  On presentation, he was hemodynamically stable.  Lab work showed sodium of 127, lipase of 1228, elevated liver enzymes, bilirubin of 20.  General surgery, GI consulted.  Attempted  ERCP but turned out to be unsuccessful.  IR did PTC with biliary drain placement on 11/29.  On 12/1,patient became febrile last night, hypotensive, tachycardic.  Underlying sepsis suspected.  Critical care , palliaitive care consulted.  Now clinically improving.  Current plan is to continue IV antibiotics, follow-up cultures, allowing improvement in the liver function.    Patient and family face ongoing treatment option decisions, advanced directive decisions and anticipatory care needs.   Subjective  Extensive chart review has been completed prior to meeting with patient/family  including labs, vital signs, imaging, progress/consult notes, orders, medications and available advance directive documents.    This NP assessed patient at the bedside as a follow up for palliative medicine needs  and emotional support and to meet with wife as scheduled for continued conversation regarding GOCs  Patient appears comfortable,  OOB to chair seems to be improving every day , o/c voiced at this time.  Wife/Kathy at bedside.  Continued education regarding current medical situation, patient is optimistic with his ongoing improvement for continued progress.   Plan is to continue to treat the treatable, patient/family currently trying to secure an OP visit with Biliary specialist at [facility].  Any assistance/communication with [facility] providers  from current/[facility]  providers is greatly appreciated.   Hope is for f/u visit at [facility] asap.  When medically stable for discharge patient and his wife are in discussion of SNF for short term rehab verse home with home health.   Education offered on the importance of function/mobility in overall wellness and recovery. Education offered on role of nutrition  on recovery  Wife brought AD paperwork, reviewed and scanned for EMR  MOST form reviewed  PMT will continue to support holistically, family encouraged to call with  needs.  Education offered today regarding  the importance of continued conversation with family and their  medical providers regarding overall plan of care and treatment options,  ensuring decisions are within the context of the patients values and GOCs.  Questions and concerns addressed     Time: 55  minutes  Detailed review of medical records ( labs, imaging, vital signs), medically appropriate exam ( MS, skin, cardiac,  resp)   discussed with treatment team, counseling and education to patient, family, staff, documenting clinical information, medication management, coordination of care    Lorinda Creed NP  Palliative Medicine Team Team Phone # [phone] Pager [phone]

## 2023-02-05 DIAGNOSIS — Z515 Encounter for palliative care: Secondary | ICD-10-CM | POA: Diagnosis not present

## 2023-02-05 DIAGNOSIS — R531 Weakness: Secondary | ICD-10-CM

## 2023-02-05 DIAGNOSIS — E440 Moderate protein-calorie malnutrition: Secondary | ICD-10-CM | POA: Diagnosis not present

## 2023-02-05 DIAGNOSIS — K851 Biliary acute pancreatitis without necrosis or infection: Secondary | ICD-10-CM | POA: Diagnosis not present

## 2023-02-05 DIAGNOSIS — K831 Obstruction of bile duct: Secondary | ICD-10-CM | POA: Diagnosis not present

## 2023-02-05 LAB — COMPREHENSIVE METABOLIC PANEL
ALT: 77 U/L — ABNORMAL HIGH (ref 0–44)
AST: 71 U/L — ABNORMAL HIGH (ref 15–41)
Albumin: 1.7 g/dL — ABNORMAL LOW (ref 3.5–5.0)
Alkaline Phosphatase: 222 U/L — ABNORMAL HIGH (ref 38–126)
Anion gap: 9 (ref 5–15)
BUN: 40 mg/dL — ABNORMAL HIGH (ref 8–23)
CO2: 24 mmol/L (ref 22–32)
Calcium: 8.5 mg/dL — ABNORMAL LOW (ref 8.9–10.3)
Chloride: 96 mmol/L — ABNORMAL LOW (ref 98–111)
Creatinine, Ser: 1.13 mg/dL (ref 0.61–1.24)
GFR, Estimated: >60 mL/min (ref >60)
Glucose, Bld: 101 mg/dL — ABNORMAL HIGH (ref 70–99)
Potassium: 3.7 mmol/L (ref 3.5–5.1)
Sodium: 129 mmol/L — ABNORMAL LOW (ref 135–145)
Total Bilirubin: 14.8 mg/dL — ABNORMAL HIGH (ref <1.2)
Total Protein: 5.1 g/dL — ABNORMAL LOW (ref 6.5–8.1)

## 2023-02-05 MED ORDER — VANCOMYCIN HCL 1.5 G IV SOLR
1500.0000 mg | INTRAVENOUS | Status: AC
  Administered 2023-02-05: 1500 mg via INTRAVENOUS
  Filled 2023-02-05: qty 30

## 2023-02-05 MED ORDER — MIDODRINE HCL 5 MG PO TABS
5.0000 mg | ORAL_TABLET | Freq: Three times a day (TID) | ORAL | Status: DC
  Administered 2023-02-05 – 2023-02-08 (×8): 5 mg via ORAL
  Filled 2023-02-05 (×8): qty 1

## 2023-02-05 MED ORDER — AMOXICILLIN-POT CLAVULANATE 875-125 MG PO TABS
1.0000 | ORAL_TABLET | Freq: Two times a day (BID) | ORAL | Status: DC
  Administered 2023-02-06 – 2023-02-07 (×3): 1 via ORAL
  Filled 2023-02-05 (×3): qty 1

## 2023-02-05 NOTE — Progress Notes (Signed)
Physical Therapy Treatment Patient Details Name: Samuel Mayo MRN: [id number] DOB: November 29, 1945 Today's Date: 02/05/2023   History of Present Illness 77 y.o. Samuel presents to [facility] 01/24/23 with worsening jaundice and follow up imaging from recent admit that showed non-visualization of CBD. MRI/MRCP showed increased mild to moderate intrahepatic bile duct dilation and new mural thickening and enhancement of the extrahepatic bile duct suspicious for cholangitis. IR performed PTC 11/29 with placement of internal/external drain. 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 Recent admit from 10/28 -11/1 for cholecystitis, diagnosed with Mirizzi syndrome ,underwent cholecystostomy. PMHx: seizure disorder on depakote, PAF, HTN, a-fib, acute biliary pancreatitis, cholecystitis, gout    PT Comments  Pt much improved from Monday both cognitively and functionally. Pt continues with generalized weakness and deconditioning with decreased activity tolerance and suspect would benefit from RW for energy conservation however pt continues to defer. Acute PT to cont to follow.    If plan is discharge home, recommend the following: A little help with walking and/or transfers;Help with stairs or ramp for entrance;Assist for transportation   Can travel by private vehicle        Equipment Recommendations  None recommended by PT (will continue to assess)    Recommendations for Other Services       Precautions / Restrictions Precautions Precautions: Fall Precaution Comments: biliary tube R UQ Restrictions Weight Bearing Restrictions: No     Mobility  Bed Mobility Overal bed mobility: Needs Assistance Bed Mobility: Supine to Sit     Supine to sit: Contact guard     General bed mobility comments: HOB elevated, pt able to bring self to EOB with increased time, more effort to scoot to EOB than sit up    Transfers Overall transfer level: Needs assistance Equipment used: None Transfers:  Sit to/from Stand Sit to Stand: Min assist           General transfer comment: minA to power up    Ambulation/Gait Ambulation/Gait assistance: Min assist Gait Distance (Feet): 180 Feet Assistive device: IV Pole Gait Pattern/deviations: Step-through pattern, Trunk flexed, Decreased stride length Gait velocity: dec     General Gait Details: pt declined use of RW and preferred use of IV pole. Pt with decreased step height and length near shuffling with onset of fatigue. 4 standing rest breaks   Stairs             Wheelchair Mobility     Tilt Bed    Modified Rankin (Stroke Patients Only)       Balance Overall balance assessment: Needs assistance Sitting-balance support: No upper extremity supported, Feet supported Sitting balance-Leahy Scale: Good     Standing balance support: No upper extremity supported, During functional activity Standing balance-Leahy Scale: Fair Standing balance comment: able to stand statically for pericare with use of RW                            Cognition Arousal: Alert Behavior During Therapy: Flat affect Overall Cognitive Status: No family/caregiver present to determine baseline cognitive functioning                                 General Comments: improved command following and sequencing compared to Monday's session. Pt with more interaction and even laughing at time however grossly with flat affect        Exercises      General  Comments General comments (skin integrity, edema, etc.): VSS      Pertinent Vitals/Pain Pain Assessment Pain Assessment: No/denies pain    Home Living                          Prior Function            PT Goals (current goals can now be found in the care plan section) Acute Rehab PT Goals PT Goal Formulation: With patient/family Time For Goal Achievement: 02/15/23 Potential to Achieve Goals: Good Progress towards PT goals: Progressing toward  goals    Frequency    Min 1X/week      PT Plan      Co-evaluation              AM-PAC PT "6 Clicks" Mobility   Outcome Measure  Help needed turning from your back to your side while in a flat bed without using bedrails?: A Little Help needed moving from lying on your back to sitting on the side of a flat bed without using bedrails?: A Little Help needed moving to and from a bed to a chair (including a wheelchair)?: A Little Help needed standing up from a chair using your arms (e.g., wheelchair or bedside chair)?: A Little Help needed to walk in hospital room?: A Little Help needed climbing 3-5 steps with a railing? : A Lot 6 Click Score: 17    End of Session   Activity Tolerance: Patient tolerated treatment well Patient left: in bed;in chair;with call bell/phone within reach Nurse Communication: Mobility status PT Visit Diagnosis: Unsteadiness on feet (R26.81)     Time: 1610-9604 PT Time Calculation (min) (ACUTE ONLY): 36 min  Charges:    $Gait Training: 8-22 mins $Therapeutic Activity: 8-22 mins PT General Charges $$ ACUTE PT VISIT: 1 Visit                     Samuel Mayo, PT, DPT Acute Rehabilitation Services Secure chat preferred Office #: [phone]    Samuel Mayo 02/05/2023, 11:51 AM

## 2023-02-05 NOTE — Progress Notes (Signed)
Pharmacy Antibiotic Note Samuel Mayo is a 77 y.o. male admitted on 01/24/2023 with acute cholecystitis. He was previously on day 9 of antibiotics with Augmentin for intraabdominal infection/obstructive jaundice. 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 Patient now presenting with s/sx sepsis. Pharmacy has been consulted for vancomycin and zosyn dosing.   -Afebrile -Scr improved from 1.66 to 1.13, nephro signed off today. -No growth to date on blood cultures  Plan: -Continue zosyn 3.375 g IV q8h -Change vancomycin to 1500mg  IV q24 hr (estimated AUC 485 using SCr 1.13)  -Will follow renal function, cultures, and clinical progress  Height: 5\' 11"  (180.3 cm) Weight: 82.5 kg (181 lb 14.1 oz) IBW/kg (Calculated) : 75.3  Temp (24hrs), Avg:98 F (36.7 C), Min:97.5 F (36.4 C), Max:98.3 F (36.8 C)  Recent Labs  Lab 01/31/23 0516 01/31/23 1501 02/01/23 0239 02/01/23 2108 02/02/23 0801 02/02/23 1117 02/03/23 0208 02/04/23 0216 02/05/23 0205  WBC 5.0  --   --   --  12.7* 9.6 6.8  --   --   CREATININE 0.79   < > 0.82  --  1.18  --  1.67* 1.66* 1.13  LATICACIDVEN  --   --   --  1.7 1.2  --   --   --   --    < > = values in this interval not displayed.    Estimated Creatinine Clearance: 57.4 mL/min (by C-G formula based on SCr of 1.13 mg/dL).    Allergies  Allergen Reactions   Other Other (See Comments)    Childhood reaction to Neosporin Ophthalmic ointment caused severe eye redness, irritation   Antimicrobials this admission:  Unasyn 11/23 >> 11/29 Augmentin 11/29 >> 12/1 Vancomycin 12/1 >> Zosyn 12/1 >>   Microbiology results:  12/1 Bcx: ngtd3  Nicole Kindred, PharmD PGY1 Pharmacy Resident 02/05/2023 8:59 AM

## 2023-02-05 NOTE — Progress Notes (Signed)
PROGRESS NOTE  Samuel Mayo  KGM:[id number] DOB: 1945-08-29 DOA: 01/24/2023 PCP: [name], [name], MD   Brief Narrative: Patient is a 77 year old male with history of hypertension, atrial fibrillation, seizures, gout, MVR s/p angioplasty who presented with worsening jaundice.  Patient was recently admitted from 10/28 -11/1 for cholecystitis, diagnosed with Mirizzi syndrome ,underwent cholecystostomy.  Follow-up imaging showed persistent occlusion of the cystic duct, elevated liver enzymes so recommended to go to the emergency department.  Patient also  reported decreased appetite, weakness.  On presentation, he was hemodynamically stable.  Lab work showed sodium of 127, lipase of 1228, elevated liver enzymes, bilirubin of 20.  General surgery, GI consulted.  Attempted  ERCP but turned out to be unsuccessful.  IR did PTC with biliary drain placement on 11/29.  On 12/1,patient became febrile last night, hypotensive, tachycardic.  Underlying sepsis suspected.  Critical care , palliaitive care consulted.  Now clinically improving.  Current plan is to continue IV antibiotics, follow-up cultures, allowing improvement in the liver function.  Assessment & Plan:  Principal Problem:   Obstructive jaundice Active Problems:   Atrial fibrillation, chronic (HCC)   Hypertension   Seizure (HCC)   Cholecystitis   Acute biliary pancreatitis   Malnutrition of moderate degree   Hyperbilirubinemia   DNR (do not resuscitate)   DNR (do not resuscitate) discussion   Obstructive jaundice: Initially admitted on 10/28 when he presented with jaundice.  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  Underwent percutaneous cholecystomy tube placement on 10/31 and was discharged home with oral antibiotics.  Outpatient labs showed persistently elevated liver enzymes. Drain study on 11/22 by IR showed persistent occlusion of cystic duct and no opacification of CBD.  MRCP showed dilated  intrahepatic bile duct with thickening of the extrahepatic bile duct, suspicious for cholangitis.  Continues to have severe elevated bilirubin, alkaline phosphatase, hyponatremia.  General surgery, GI consulted.  Currently on Unasyn. Denies abdomen pain, nausea or vomiting today.  Remains intensely jaundiced. Attempted  ERCP on 11/27  but turned out to be unsuccessful.   IR did PTC with biliary drain placement on 11/29.  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    He will follow-up with Dr. Allen(hepatobiliary surgery) as an  outpatient at [facility]  IR might put left-sided drain if bilirubin does not trend down significantly.  Bilirubin has plateaued in the range of 14.8  Sepsis: Became febrile since the night of 11/30, developed leukocytosis.  Lactate level normal. CT abdomen/pelvis ordered without finding of any acute problem.  Continue broad spectrum antibiotics for today.  Culture ordered, no growth till date.  He remains  afebrile this morning.  No leukocytosis.  Clinically improved.  May change to Augmentin tomorrow if cultures remain negative.  Will continue antibiotics with total duration of 4 weeks  from beginning  Hyponatremia: Baseline sodium runs low in the range of 120-132.  Sodium dropped to the range of 122, prompting nephrology consult.  Started on urea, Lasix now stopped.  Likely SIADH.  Sodium is stable in the range of 120s.  Lasix discontinued  AKI: Likely hemodynamically mediated, patient was hypotensive .  Now resolved  Hypertension: On amlodipine, Toprol at home.  Currently on hold  Paroxysmal A-fib: On metoprolol.  Continue to monitor on telemetry.  Eliquis on hold pending further plan from IR.  Remains in normal sinus rhythm at present.  Seizure disorder: On Depakote.  Elevated liver enzymes is not from Depakote but from obstructive  liver disease.  History of mitral valve regurgitation: Status post annuloplasty.  Follow-up with cardiology as an  outpatient  Goals of care: Palliative care, critical care consulted.  Patient might have underlying biliary tumor which looks like unresectable as per general surgery.  Poor prognosis.  CODE STATUS changed to DNR.  Might be a candidate for hospice/comfort care if further declines.       Nutrition Problem: Moderate Malnutrition Etiology: acute illness    DVT prophylaxis:SCDs Start: 01/24/23 1337     Code Status: Limited: Do not attempt resuscitation (DNR) -DNR-LIMITED -Do Not Intubate/DNI   Family Communication: Significant other at bedside on 12/3   Patient status:Inpatient  Patient is from :Home  Anticipated discharge to: home  Estimated DC date: 1-2 days    Consultants: GI, general surgery, PCCM, palliative care  Procedures: ERCP, PTC  Antimicrobials:  Anti-infectives (From admission, onward)    Start     Dose/Rate Route Frequency Ordered Stop   02/05/23 1000  Vancomycin (VANCOCIN) 1,500 mg in sodium chloride 0.9 % 500 mL IVPB        1,500 mg 250 mL/hr over 120 Minutes Intravenous Every 24 hours 02/05/23 0903     02/04/23 1000  vancomycin (VANCOCIN) IVPB 1000 mg/200 mL premix  Status:  Discontinued        1,000 mg 200 mL/hr over 60 Minutes Intravenous Every 24 hours 02/03/23 1122 02/05/23 0903   02/03/23 1000  Vancomycin (VANCOCIN) 1,500 mg in sodium chloride 0.9 % 500 mL IVPB  Status:  Discontinued        1,500 mg 250 mL/hr over 120 Minutes Intravenous Every 24 hours 02/02/23 1006 02/03/23 1122   02/02/23 2100  vancomycin (VANCOCIN) IVPB 1000 mg/200 mL premix  Status:  Discontinued        1,000 mg 200 mL/hr over 60 Minutes Intravenous Every 12 hours 02/02/23 0811 02/02/23 1006   02/02/23 1100  vancomycin (VANCOCIN) IVPB 1000 mg/200 mL premix       Placed in "Followed by" Linked Group   1,000 mg 200 mL/hr over 60 Minutes Intravenous  Once 02/02/23 0913 02/02/23 1500   02/02/23 1000  vancomycin (VANCOCIN) IVPB 1000 mg/200 mL premix       Placed in "Followed by"  Linked Group   1,000 mg 200 mL/hr over 60 Minutes Intravenous  Once 02/02/23 0913 02/02/23 1340   02/02/23 0900  vancomycin (VANCOREADY) IVPB 2000 mg/400 mL  Status:  Discontinued        2,000 mg 200 mL/hr over 120 Minutes Intravenous  Once 02/02/23 0804 02/02/23 0913   02/02/23 0900  piperacillin-tazobactam (ZOSYN) IVPB 3.375 g        3.375 g 12.5 mL/hr over 240 Minutes Intravenous Every 8 hours 02/02/23 0805     02/01/23 2200  amoxicillin-clavulanate (AUGMENTIN) 875-125 MG per tablet 1 tablet  Status:  Discontinued        1 tablet Oral Every 12 hours 02/01/23 1150 02/02/23 0738   01/31/23 1058  cefOXitin (MEFOXIN) 2 g in sodium chloride 0.9 % 100 mL IVPB        over 30 Minutes  Continuous PRN 01/31/23 1058 01/31/23 1058   01/31/23 0000  cefOXitin (MEFOXIN) 2 g in sodium chloride 0.9 % 100 mL IVPB  Status:  Discontinued        2 g 200 mL/hr over 30 Minutes Intravenous To Radiology 01/30/23 1014 01/30/23 1015   01/25/23 1600  ampicillin-sulbactam (UNASYN) 1.5 g in sodium chloride 0.9 % 100 mL IVPB  Status:  Discontinued        1.5 g 200 mL/hr over 30 Minutes Intravenous Every 6 hours 01/25/23 1525 02/01/23 1150       Subjective: Patient seen and examined bedside today.  Hemodynamically stable.  Overall comfortable.  Lying in bed.  Denies any new complaints today.  No abdomen pain, nausea or vomiting or shortness of breath.Blood  pressure soft but stable  Objective: Vitals:   02/04/23 2001 02/04/23 2343 02/05/23 0223 02/05/23 0645  BP: 102/72 100/66 108/73 98/63  Pulse: 92 92 89 83  Resp: 19 19 16 18   Temp: 98.3 F (36.8 C) 98.2 F (36.8 C) 97.6 F (36.4 C) 97.9 F (36.6 C)  TempSrc: Oral Oral Oral Oral  SpO2: 96% 96% 96% 96%  Weight:   82.5 kg   Height:        Intake/Output Summary (Last 24 hours) at 02/05/2023 1047 Last data filed at 02/05/2023 0738 Gross per 24 hour  Intake 563.11 ml  Output 1700 ml  Net -1136.89 ml   Filed Weights   01/24/23 0953 02/05/23 0223   Weight: 86.2 kg 82.5 kg    Examination:  General exam: Overall comfortable, not in distress, weak/deconditioned HEENT: PERRL Respiratory system:  no wheezes or crackles  Cardiovascular system: S1 & S2 heard, RRR.  Gastrointestinal system: Abdomen is nondistended, soft and nontender.  2 right-sided abdominal drains Central nervous system: Alert and oriented Extremities: No edema, no clubbing ,no cyanosis Skin: Jaundice   Data Reviewed: I have personally reviewed following labs and imaging studies  CBC: Recent Labs  Lab 01/31/23 0516 02/02/23 0801 02/02/23 1117 02/03/23 0208  WBC 5.0 12.7* 9.6 6.8  HGB 9.5* 6.8* 10.9* 9.1*  HCT 27.7* 19.7* 31.7* 26.6*  MCV 98.2 98.5 100.3* 99.3  PLT 205 250 220 183   Basic Metabolic Panel: Recent Labs  Lab 02/01/23 0239 02/02/23 0801 02/03/23 0208 02/04/23 0216 02/05/23 0205  NA 127* 127* 125* 129* 129*  K 3.8 3.3* 3.4* 3.8 3.7  CL 93* 92* 92* 96* 96*  CO2 26 22 24 23 24   GLUCOSE 117* 117* 94 102* 101*  BUN 25* 25* 56* 70* 40*  CREATININE 0.82 1.18 1.67* 1.66* 1.13  CALCIUM 8.6* 8.4* 8.3* 8.5* 8.5*     Recent Results (from the past 240 hour(s))  Culture, blood (Routine X 2) w Reflex to ID Panel     Status: None (Preliminary result)   Collection Time: 02/02/23  8:03 AM   Specimen: BLOOD LEFT ARM  Result Value Ref Range Status   Specimen Description BLOOD LEFT ARM  Final   Special Requests   Final    BOTTLES DRAWN AEROBIC AND ANAEROBIC Blood Culture results may not be optimal due to an inadequate volume of blood received in culture bottles   Culture   Final    NO GROWTH 3 DAYS Performed at [facility], [address] [place], [place] [address]    Report Status PENDING  Incomplete  Culture, blood (Routine X 2) w Reflex to ID Panel     Status: None (Preliminary result)   Collection Time: 02/02/23  8:04 AM   Specimen: BLOOD LEFT ARM  Result Value Ref Range Status   Specimen Description BLOOD LEFT ARM  Final    Special Requests   Final    BOTTLES DRAWN AEROBIC AND ANAEROBIC Blood Culture results may not be optimal due to an inadequate volume of blood received in culture bottles   Culture   Final  NO GROWTH 3 DAYS Performed at [facility], [address], [place], [place] [address]    Report Status PENDING  Incomplete     Radiology Studies: No results found.  Scheduled Meds:  sodium chloride   Intravenous Once   divalproex  750 mg Oral BID   feeding supplement  237 mL Oral BID BM   midodrine  5 mg Oral TID WC   multivitamin with minerals  1 tablet Oral Daily   sodium chloride flush  3 mL Intravenous Q12H   sodium chloride flush  5 mL Intracatheter Q8H   Continuous Infusions:  piperacillin-tazobactam (ZOSYN)  IV 3.375 g (02/05/23 0627)   vancomycin 1,500 mg (02/05/23 1029)     LOS: 10 days   Burnadette Pop, MD [facility] P12/06/2022, 10:47 AM

## 2023-02-05 NOTE — Progress Notes (Signed)
Patient ID: Samuel Mayo, male   DOB: 15-Nov-1945, 77 y.o.   MRN: [id number] [facility] Progress Note   Assessment/ Plan:   1. Acute kidney Injury: Oliguric and clinically suspected to be ATN from relative hypotension.  Renal function significantly better on labs this morning with decent urine output charted overnight.  Nephrology service will sign off and remain available for questions or concerns. 2.  Hyponatremia: Acute on chronic with underlying SIADH based on previous labs with poor solute intake secondary to abdominal pain likely contributing to worsening hyponatremia.  Overnight stable sodium level status post discontinuation of UreNa and with improving urine output/renal function.  Continue to limit oral fluid intake to <1.2 L a day. 3.  Biliary obstruction/sepsis: Status post biliary drains that appear to be functioning well and on broad-spectrum antimicrobial therapy.  Clinically improving with plans noted for advanced surgical intervention at [facility] as an outpatient. 4.  Anemia: Secondary to critical illness, monitor trend to decide on need for PRBC transfusion.  Subjective:   Without acute events noted overnight   Objective:   BP 98/63 (BP Location: Right Arm)   Pulse 83   Temp 97.9 F (36.6 C) (Oral)   Resp 18   Ht 5\' 11"  (1.803 m)   Wt 82.5 kg   SpO2 96%   BMI 25.37 kg/m   Intake/Output Summary (Last 24 hours) at 02/05/2023 0803 Last data filed at 02/05/2023 4098 Gross per 24 hour  Intake 605.4 ml  Output 1700 ml  Net -1094.6 ml   Weight change:   Physical Exam: Gen: Icteric man sitting up comfortably in bed drinking coffee. CVS: Pulse regular rhythm, normal rate, ESM audible over outflow tract Resp: Poor inspiratory effort with decreased breath sounds over bases, no rhonchi Abd: Soft, nontender, biliary drains in situ Ext: Trace edema over lower extremities  Imaging: No results found.  Labs: BMET Recent Labs  Lab 01/31/23 0516  01/31/23 1501 02/01/23 0239 02/02/23 0801 02/03/23 0208 02/04/23 0216 02/05/23 0205  NA 126* 128* 127* 127* 125* 129* 129*  K 3.9 4.1 3.8 3.3* 3.4* 3.8 3.7  CL 97* 95* 93* 92* 92* 96* 96*  CO2 21* 23 26 22 24 23 24   GLUCOSE 96 108* 117* 117* 94 102* 101*  BUN 33* 24* 25* 25* 56* 70* 40*  CREATININE 0.79 0.89 0.82 1.18 1.67* 1.66* 1.13  CALCIUM 8.4* 8.4* 8.6* 8.4* 8.3* 8.5* 8.5*   CBC Recent Labs  Lab 01/31/23 0516 02/02/23 0801 02/02/23 1117 02/03/23 0208  WBC 5.0 12.7* 9.6 6.8  HGB 9.5* 6.8* 10.9* 9.1*  HCT 27.7* 19.7* 31.7* 26.6*  MCV 98.2 98.5 100.3* 99.3  PLT 205 250 220 183    Medications:     sodium chloride   Intravenous Once   divalproex  750 mg Oral BID   feeding supplement  237 mL Oral BID BM   midodrine  5 mg Oral TID WC   multivitamin with minerals  1 tablet Oral Daily   sodium chloride flush  3 mL Intravenous Q12H   sodium chloride flush  5 mL Intracatheter Q8H   Zetta Bills, MD 02/05/2023, 8:03 AM

## 2023-02-05 NOTE — Progress Notes (Signed)
Mobility Specialist Progress Note:    02/05/23 1500  Mobility  Activity Ambulated with assistance in room;Ambulated with assistance to bathroom  Level of Assistance Contact guard assist, steadying assist  Assistive Device Other (Comment) (IV Pole)  Distance Ambulated (ft) 20 ft (10+10)  Activity Response Tolerated well  Mobility Referral Yes  $Mobility charge 1 Mobility  Mobility Specialist Start Time (ACUTE ONLY) 1330  Mobility Specialist Stop Time (ACUTE ONLY) 1345  Mobility Specialist Time Calculation (min) (ACUTE ONLY) 15 min   Pt received in chair, needing assistance to BR. Only requiring contact guard throughout. Void successful. Pt performed peri care independently. Pt left in chair with call bell and all needs met. Family present.  D'Vante Earlene Plater Mobility Specialist Please contact via Special educational needs teacher or Rehab office at [phone]

## 2023-02-05 NOTE — Consult Note (Signed)
[facility] Liaison Consult Note   02/05/2023  Kanya Twyman [facility] 01/04/1946 [id number]  Insurance: [employer] Medicare   Primary Care Provider: [name], [name], MD  with [facility] [place] this provider is listed for the transition of care [TOC]  follow up appointments  and TOC follow up calls   [facility] Liaison screened the patient remotely at [facility]. Patient had visitors on rounds.   The patient was screened forLLOS and  30 day readmission hospitalization with noted medium risk score for unplanned readmission risk 2 hospital admissions in 6 months.  The patient was assessed for potential Community Care Coordination service needs for post hospital transition for care coordination. Review of patient's electronic medical record reveals patient is currently for home with home health.   Plan: [facility] Liaison will continue to follow progress and disposition to asess for post hospital community care coordination/management needs.  Referral request for community care coordination: Patient anticipated follow up from [facility] team call.   [facility] Community Care, Population Health does not replace or interfere with any arrangements made by the Inpatient Transition of Care team.   For questions contact:   Charlesetta Shanks, RN, BSN, CCM [place]  [facility] Liaison Direct Dial: [phone] or secure chat Email: [email or web address]

## 2023-02-06 ENCOUNTER — Inpatient Hospital Stay (HOSPITAL_COMMUNITY): Payer: Medicare Other

## 2023-02-06 DIAGNOSIS — K831 Obstruction of bile duct: Secondary | ICD-10-CM | POA: Diagnosis not present

## 2023-02-06 LAB — TYPE AND SCREEN
ABO/RH(D): B POS
Antibody Screen: NEGATIVE
Unit division: 0
Unit division: 0

## 2023-02-06 LAB — BPAM RBC
Blood Product Expiration Date: 202412162359
Blood Product Expiration Date: 202412162359
Unit Type and Rh: 7300
Unit Type and Rh: 7300

## 2023-02-06 LAB — COMPREHENSIVE METABOLIC PANEL
ALT: 100 U/L — ABNORMAL HIGH (ref 0–44)
AST: 98 U/L — ABNORMAL HIGH (ref 15–41)
Albumin: 1.8 g/dL — ABNORMAL LOW (ref 3.5–5.0)
Alkaline Phosphatase: 219 U/L — ABNORMAL HIGH (ref 38–126)
Anion gap: 12 (ref 5–15)
BUN: 37 mg/dL — ABNORMAL HIGH (ref 8–23)
CO2: 23 mmol/L (ref 22–32)
Calcium: 8.7 mg/dL — ABNORMAL LOW (ref 8.9–10.3)
Chloride: 94 mmol/L — ABNORMAL LOW (ref 98–111)
Creatinine, Ser: 0.95 mg/dL (ref 0.61–1.24)
GFR, Estimated: >60 mL/min (ref >60)
Glucose, Bld: 109 mg/dL — ABNORMAL HIGH (ref 70–99)
Potassium: 3.9 mmol/L (ref 3.5–5.1)
Sodium: 129 mmol/L — ABNORMAL LOW (ref 135–145)
Total Bilirubin: 14.7 mg/dL — ABNORMAL HIGH (ref <1.2)
Total Protein: 5.5 g/dL — ABNORMAL LOW (ref 6.5–8.1)

## 2023-02-06 MED ORDER — IOHEXOL 350 MG/ML SOLN
75.0000 mL | Freq: Once | INTRAVENOUS | Status: AC | PRN
  Administered 2023-02-06: 75 mL via INTRAVENOUS

## 2023-02-06 NOTE — Progress Notes (Addendum)
Physical Therapy Treatment Patient Details Name: Samuel Mayo MRN: [id number] DOB: 04/25/1945 Today's Date: 02/06/2023   History of Present Illness 77 y.o. male [facility] 01/24/23 with worsening jaundice and follow up imaging from recent admit that showed non-visualization of CBD. MRI/MRCP showed increased mild to moderate intrahepatic bile duct dilation and new mural thickening and enhancement of the extrahepatic bile duct suspicious for cholangitis. IR performed PTC 11/29 with placement of internal/external drain. 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 Recent admit from 10/28 -11/1 for cholecystitis, diagnosed with Mirizzi syndrome ,underwent cholecystostomy. PMHx: seizure disorder on depakote, PAF, HTN, a-fib, acute biliary pancreatitis, cholecystitis, gout    PT Comments  Pt received in chair after having eaten lunch, ate about half of his soup and 1/4 of sandwich, Reports the food has no taste though he did enjoy the cookie and reported that it tasted good. CGA for sit>stand from recliner. Pt unstable without UE support with dynamic balance, reaches for stable surfaces and wife reports that he always holds someone's hand. Needed min A when he did not have AD. Encouraged him to use rollator that was in hallway and he ambulated with it without LOB and with CGA. Would benefit from rollator for home for balance and energy conservation if pt agreeable. Given pt's balance deficits and decreased tolerance for activity recommend further PT after acute setting. Patient will benefit from continued inpatient follow up therapy, <3 hours/day. PT will continue to follow.    If plan is discharge home, recommend the following: A little help with walking and/or transfers;Help with stairs or ramp for entrance;Assist for transportation   Can travel by private vehicle        Equipment Recommendations  Rollator (4 wheels)    Recommendations for Other Services       Precautions /  Restrictions Precautions Precautions: Fall Precaution Comments: biliary tube R UQ Restrictions Weight Bearing Restrictions: No     Mobility  Bed Mobility               General bed mobility comments: pt received in chair    Transfers Overall transfer level: Needs assistance Equipment used: None Transfers: Sit to/from Stand Sit to Stand: Contact guard assist           General transfer comment: CGA for safety, no physical assist given    Ambulation/Gait Ambulation/Gait assistance: Min assist, Contact guard assist Gait Distance (Feet): 200 Feet Assistive device: None, Standup Rollator Gait Pattern/deviations: Step-through pattern, Trunk flexed, Decreased stride length Gait velocity: dec Gait velocity interpretation: <1.8 ft/sec, indicate of risk for recurrent falls   General Gait Details: without AD pt needed AD and reaches for stable surfaces and had 2 LOB needing min A. Pt instructed to push upright rollator that was available in hallway and pt much more stable. No LOB with CGA. Discussed the energy conservation benefits of using a rollator as well but pt still skeptical   Stairs             Wheelchair Mobility     Tilt Bed    Modified Rankin (Stroke Patients Only)       Balance Overall balance assessment: Needs assistance Sitting-balance support: No upper extremity supported, Feet supported Sitting balance-Leahy Scale: Good     Standing balance support: No upper extremity supported, During functional activity Standing balance-Leahy Scale: Fair Standing balance comment: able to stand statically without UE support, prefers to have hand on stable surface though  Cognition Arousal: Alert Behavior During Therapy: Flat affect Overall Cognitive Status: Impaired/Different from baseline Area of Impairment: Memory, Following commands, Safety/judgement, Awareness                     Memory: Decreased  short-term memory Following Commands: Follows one step commands consistently, Follows multi-step commands with increased time Safety/Judgement: Decreased awareness of safety, Decreased awareness of deficits Awareness: Emergent   General Comments: pt very flat, sometimes looking away instead of anwering questions. Difficult to discern if he is feeling down about jaundice not improving (which he did mention) or if there are cog issues going on        Exercises      General Comments General comments (skin integrity, edema, etc.): VSS on RA      Pertinent Vitals/Pain Pain Assessment Pain Assessment: No/denies pain    Home Living                          Prior Function            PT Goals (current goals can now be found in the care plan section) Acute Rehab PT Goals Patient Stated Goal: to work on balance PT Goal Formulation: With patient/family Time For Goal Achievement: 02/15/23 Potential to Achieve Goals: Good Progress towards PT goals: Progressing toward goals    Frequency    Min 1X/week      PT Plan      Co-evaluation              AM-PAC PT "6 Clicks" Mobility   Outcome Measure  Help needed turning from your back to your side while in a flat bed without using bedrails?: A Little Help needed moving from lying on your back to sitting on the side of a flat bed without using bedrails?: A Little Help needed moving to and from a bed to a chair (including a wheelchair)?: A Little Help needed standing up from a chair using your arms (e.g., wheelchair or bedside chair)?: A Little Help needed to walk in hospital room?: A Little Help needed climbing 3-5 steps with a railing? : A Lot 6 Click Score: 17    End of Session Equipment Utilized During Treatment: Gait belt Activity Tolerance: Patient tolerated treatment well Patient left: in chair;with call bell/phone within reach;with family/visitor present Nurse Communication: Mobility status PT Visit  Diagnosis: Unsteadiness on feet (R26.81)     Time: 1751-0258 PT Time Calculation (min) (ACUTE ONLY): 28 min  Charges:    $Gait Training: 23-37 mins PT General Charges $$ ACUTE PT VISIT: 1 Visit                     Lyanne Co, PT  Acute Rehab Services Secure chat preferred Office [phone]    Lawana Chambers Estella Malatesta 02/06/2023, 1:43 PM

## 2023-02-06 NOTE — Plan of Care (Signed)

## 2023-02-06 NOTE — Progress Notes (Signed)
PROGRESS NOTE  Samuel Mayo  WUJ:[id number] DOB: 1945-04-22 DOA: 01/24/2023 PCP: [name], [name], MD   Brief Narrative: Patient is a 77 year old male with history of hypertension, atrial fibrillation, seizures, gout, MVR s/p angioplasty who presented with worsening jaundice.  Patient was recently admitted from 10/28 -11/1 for cholecystitis, diagnosed with Mirizzi syndrome ,underwent cholecystostomy.  Follow-up imaging showed persistent occlusion of the cystic duct, elevated liver enzymes so recommended to go to the emergency department.  Patient also  reported decreased appetite, weakness.  On presentation, he was hemodynamically stable.  Lab work showed sodium of 127, lipase of 1228, elevated liver enzymes, bilirubin of 20.  General surgery, GI consulted.  Attempted  ERCP but turned out to be unsuccessful.  IR did PTC with biliary drain placement on 11/29.  On 12/1,patient became febrile last night, hypotensive, tachycardic.  Underlying sepsis suspected.  Critical care , palliaitive care consulted.  Now clinically improving.  IR discussing with family about the potential need of left-sided biliary drain.  PT reevaluation requested  Assessment & Plan:  Principal Problem:   Obstructive jaundice Active Problems:   Atrial fibrillation, chronic (HCC)   Hypertension   Seizure (HCC)   Cholecystitis   Acute biliary pancreatitis   Malnutrition of moderate degree   Hyperbilirubinemia   DNR (do not resuscitate)   DNR (do not resuscitate) discussion   Palliative care by specialist   Weakness generalized   Obstructive jaundice: Initially admitted on 10/28 when he presented with jaundice.  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  Underwent percutaneous cholecystomy tube placement on 10/31 and was discharged home with oral antibiotics.  Outpatient labs showed persistently elevated liver enzymes. Drain study on 11/22 by IR showed persistent occlusion of cystic  duct and no opacification of CBD.  MRCP showed dilated intrahepatic bile duct with thickening of the extrahepatic bile duct, suspicious for cholangitis.  Continues to have severe elevated bilirubin, alkaline phosphatase, hyponatremia.  General surgery, GI consulted.  Currently on Unasyn. Denies abdomen pain, nausea or vomiting today.  Remains intensely jaundiced. Attempted  ERCP on 11/27  but turned out to be unsuccessful.   IR did PTC with biliary drain placement on 11/29.  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    He will follow-up with Dr. Allen(hepatobiliary surgery) as an  outpatient at [facility].    Bilirubin has plateaued in the range of 14.8.IR discussing with family about the potential need of left-sided biliary drain.   Sepsis: Became febrile since the night of 11/30, developed leukocytosis.  Lactate level normal. CT abdomen/pelvis ordered without finding of any acute problem.  Continue broad spectrum antibiotics for today.  Culture ordered, no growth till date.  He remains  afebrile this morning.  No leukocytosis.  Clinically improved.  Chnaged to augmentin Will continue antibiotics with total duration of 4 weeks  from beginning  Hyponatremia: Baseline sodium runs low in the range of 120-132.  Sodium dropped to the range of 122, prompting nephrology consult.  Started on urea, Lasix now stopped.  Likely SIADH.  Sodium is stable in the range of 120s.  Lasix discontinued  AKI: Likely hemodynamically mediated, patient was hypotensive .  Now resolved  Hypertension: On amlodipine, Toprol at home.  Currently on hold  Paroxysmal A-fib: On metoprolol.  Continue to monitor on telemetry.  Eliquis on hold pending further plan from IR.  Remains in normal sinus rhythm at present.  Seizure disorder: On Depakote.  Elevated liver enzymes is not  from Depakote but from obstructive liver disease.  History of mitral valve regurgitation: Status post annuloplasty.  Follow-up with cardiology  as an outpatient  Goals of care: Palliative care, critical care consulted.  Patient might have underlying biliary tumor which looks like unresectable as per general surgery.  Poor prognosis.  CODE STATUS changed to DNR.  Might be a candidate for hospice/comfort care if further declines.    Deconditioning/debility: PT/OT recommending home health.  Wife requesting for a SNF.  PT reevaluation requested   Nutrition Problem: Moderate Malnutrition Etiology: acute illness    DVT prophylaxis:SCDs Start: 01/24/23 1337     Code Status: Limited: Do not attempt resuscitation (DNR) -DNR-LIMITED -Do Not Intubate/DNI   Family Communication: Significant other at bedside on 12/5   Patient status:Inpatient  Patient is from :Home  Anticipated discharge to: home vs SnF  Estimated DC date: 1-2 days    Consultants: GI, general surgery, PCCM, palliative care  Procedures: ERCP, PTC  Antimicrobials:  Anti-infectives (From admission, onward)    Start     Dose/Rate Route Frequency Ordered Stop   02/06/23 1000  amoxicillin-clavulanate (AUGMENTIN) 875-125 MG per tablet 1 tablet        1 tablet Oral Every 12 hours 02/05/23 1108 02/22/23 0959   02/05/23 1000  Vancomycin (VANCOCIN) 1,500 mg in sodium chloride 0.9 % 500 mL IVPB        1,500 mg 250 mL/hr over 120 Minutes Intravenous Every 24 hours 02/05/23 0903 02/05/23 1229   02/04/23 1000  vancomycin (VANCOCIN) IVPB 1000 mg/200 mL premix  Status:  Discontinued        1,000 mg 200 mL/hr over 60 Minutes Intravenous Every 24 hours 02/03/23 1122 02/05/23 0903   02/03/23 1000  Vancomycin (VANCOCIN) 1,500 mg in sodium chloride 0.9 % 500 mL IVPB  Status:  Discontinued        1,500 mg 250 mL/hr over 120 Minutes Intravenous Every 24 hours 02/02/23 1006 02/03/23 1122   02/02/23 2100  vancomycin (VANCOCIN) IVPB 1000 mg/200 mL premix  Status:  Discontinued        1,000 mg 200 mL/hr over 60 Minutes Intravenous Every 12 hours 02/02/23 0811 02/02/23 1006    02/02/23 1100  vancomycin (VANCOCIN) IVPB 1000 mg/200 mL premix       Placed in "Followed by" Linked Group   1,000 mg 200 mL/hr over 60 Minutes Intravenous  Once 02/02/23 0913 02/02/23 1500   02/02/23 1000  vancomycin (VANCOCIN) IVPB 1000 mg/200 mL premix       Placed in "Followed by" Linked Group   1,000 mg 200 mL/hr over 60 Minutes Intravenous  Once 02/02/23 0913 02/02/23 1340   02/02/23 0900  vancomycin (VANCOREADY) IVPB 2000 mg/400 mL  Status:  Discontinued        2,000 mg 200 mL/hr over 120 Minutes Intravenous  Once 02/02/23 0804 02/02/23 0913   02/02/23 0900  piperacillin-tazobactam (ZOSYN) IVPB 3.375 g        3.375 g 12.5 mL/hr over 240 Minutes Intravenous Every 8 hours 02/02/23 0805 02/05/23 2359   02/01/23 2200  amoxicillin-clavulanate (AUGMENTIN) 875-125 MG per tablet 1 tablet  Status:  Discontinued        1 tablet Oral Every 12 hours 02/01/23 1150 02/02/23 0738   01/31/23 1058  cefOXitin (MEFOXIN) 2 g in sodium chloride 0.9 % 100 mL IVPB        over 30 Minutes  Continuous PRN 01/31/23 1058 01/31/23 1058   01/31/23 0000  cefOXitin (MEFOXIN) 2 g in  sodium chloride 0.9 % 100 mL IVPB  Status:  Discontinued        2 g 200 mL/hr over 30 Minutes Intravenous To Radiology 01/30/23 1014 01/30/23 1015   01/25/23 1600  ampicillin-sulbactam (UNASYN) 1.5 g in sodium chloride 0.9 % 100 mL IVPB  Status:  Discontinued        1.5 g 200 mL/hr over 30 Minutes Intravenous Every 6 hours 01/25/23 1525 02/01/23 1150       Subjective: Patient seen and examined at bedside today.  Overall comfortable.  Hemodynamically stable, lying in bed.  Blood pressure better today.  Denies any new complaints.  No nausea, vomiting or abdominal pain.  Long discussion had with the wife at bedside.  She is interested in SNF.  Radiology discussing with family about the need of left-sided biliary drain  Objective: Vitals:   02/05/23 2324 02/06/23 0251 02/06/23 0851 02/06/23 1121  BP: 92/70 103/73 98/75 119/81   Pulse: 86 100    Resp: 16 20    Temp: 98.2 F (36.8 C) 98.1 F (36.7 C) 98.4 F (36.9 C) 98.4 F (36.9 C)  TempSrc: Oral Oral Oral Oral  SpO2: 100% 99%    Weight:      Height:        Intake/Output Summary (Last 24 hours) at 02/06/2023 1142 Last data filed at 02/06/2023 0651 Gross per 24 hour  Intake 1008.58 ml  Output 1850 ml  Net -841.42 ml   Filed Weights   01/24/23 0953 02/05/23 0223  Weight: 86.2 kg 82.5 kg    Examination:   General exam: Overall comfortable, not in distress, appears deconditioned, jaundiced HEENT: PERRL Respiratory system:  no wheezes or crackles  Cardiovascular system: S1 & S2 heard, RRR.  Gastrointestinal system: Abdomen is nondistended, soft and nontender.  2 right-sided abdominal drains Central nervous system: Alert and oriented Extremities: No edema, no clubbing ,no cyanosis Skin: Jaundice   Data Reviewed: I have personally reviewed following labs and imaging studies  CBC: Recent Labs  Lab 01/31/23 0516 02/02/23 0801 02/02/23 1117 02/03/23 0208  WBC 5.0 12.7* 9.6 6.8  HGB 9.5* 6.8* 10.9* 9.1*  HCT 27.7* 19.7* 31.7* 26.6*  MCV 98.2 98.5 100.3* 99.3  PLT 205 250 220 183   Basic Metabolic Panel: Recent Labs  Lab 02/02/23 0801 02/03/23 0208 02/04/23 0216 02/05/23 0205 02/06/23 0203  NA 127* 125* 129* 129* 129*  K 3.3* 3.4* 3.8 3.7 3.9  CL 92* 92* 96* 96* 94*  CO2 22 24 23 24 23   GLUCOSE 117* 94 102* 101* 109*  BUN 25* 56* 70* 40* 37*  CREATININE 1.18 1.67* 1.66* 1.13 0.95  CALCIUM 8.4* 8.3* 8.5* 8.5* 8.7*     Recent Results (from the past 240 hour(s))  Culture, blood (Routine X 2) w Reflex to ID Panel     Status: None (Preliminary result)   Collection Time: 02/02/23  8:03 AM   Specimen: BLOOD LEFT ARM  Result Value Ref Range Status   Specimen Description BLOOD LEFT ARM  Final   Special Requests   Final    BOTTLES DRAWN AEROBIC AND ANAEROBIC Blood Culture results may not be optimal due to an inadequate volume of  blood received in culture bottles   Culture   Final    NO GROWTH 4 DAYS Performed at [facility], [address] [place], [place] [address]    Report Status PENDING  Incomplete  Culture, blood (Routine X 2) w Reflex to ID Panel  Status: None (Preliminary result)   Collection Time: 02/02/23  8:04 AM   Specimen: BLOOD LEFT ARM  Result Value Ref Range Status   Specimen Description BLOOD LEFT ARM  Final   Special Requests   Final    BOTTLES DRAWN AEROBIC AND ANAEROBIC Blood Culture results may not be optimal due to an inadequate volume of blood received in culture bottles   Culture   Final    NO GROWTH 4 DAYS Performed at [facility], [address] [place], [place] [address]    Report Status PENDING  Incomplete     Radiology Studies: No results found.  Scheduled Meds:  sodium chloride   Intravenous Once   amoxicillin-clavulanate  1 tablet Oral Q12H   divalproex  750 mg Oral BID   feeding supplement  237 mL Oral BID BM   midodrine  5 mg Oral TID WC   multivitamin with minerals  1 tablet Oral Daily   sodium chloride flush  3 mL Intravenous Q12H   sodium chloride flush  5 mL Intracatheter Q8H   Continuous Infusions:     LOS: 11 days   Burnadette Pop, MD [facility] P12/07/2022, 11:42 AM

## 2023-02-06 NOTE — Progress Notes (Signed)
Dr. Loreta Ave met with the patient and his family member at the bedside to discuss placement of another biliary drain to decompress the left side of the biliary system. Patient requested time to think over the risks/benefits. Patient agreeable to being made NPO at midnight and I will see the patient in the morning to follow up. An IR order for the procedure has been placed to hold a spot for him on our schedule tomorrow.   Alwyn Ren, [place] [phone] 02/06/2023, 3:58 PM

## 2023-02-06 NOTE — TOC Progression Note (Signed)
Transition of Care [facility]) - Progression Note    Patient Details  Name: Nubaid Fekete MRN: [id number] Date of Birth: 06-06-1945  Transition [facility]) CM/SW Contact  Marliss Coots, LCSW Phone Number: 02/06/2023, 4:02 PM  Clinical Narrative:     4:02 PM This CSW introduced herself and role to patient at bedside. Patient's son, Caryn Bee, was also present at bedside. CSW sought consent from patient for speaking to them in front of [place]. Patient verbally consented this CSW to speak with him in front of [place]. CSW followed up on SNF recommendation. Patient and Caryn Bee confirmed continued interest in SNF upon discharge. CSW asked if either had SNF preferences. Patient and Caryn Bee expressed interest in SNFs in [place]. CSW displayed understanding of preference and that options would be provided tomorrow. Patient and Caryn Bee expressed understanding of this information. CSW informed patient and Caryn Bee of possible co-pay costs that would no be known until billing sent after transportation. Patient and Caryn Bee expressed understanding of this. Caryn Bee informed CSW that they are still deciding on transportation options for discharge due to continued medical workup.  Expected Discharge Plan: Skilled Nursing Facility Barriers to Discharge: Continued Medical Work up, English as a second language teacher, SNF Pending bed offer  Expected Discharge Plan and Services In-house Referral: Clinical Social Work Discharge Planning Services: CM Consult   Living arrangements for the past 2 months: Single Family Home                                       Social Determinants of Health (SDOH) Interventions SDOH Screenings   Food Insecurity: No Food Insecurity (01/25/2023)  Housing: Patient Declined (01/25/2023)  Transportation Needs: No Transportation Needs (01/25/2023)  Utilities: Not At Risk (01/25/2023)  Alcohol Screen: Low Risk  (10/21/2022)  Depression (PHQ2-9): High Risk (01/13/2023)  Financial Resource  Strain: Low Risk  (10/21/2022)  Physical Activity: Insufficiently Active (10/21/2022)  Social Connections: Moderately Integrated (10/21/2022)  Stress: Stress Concern Present (10/21/2022)  Tobacco Use: Low Risk  (01/31/2023)    Readmission Risk Interventions     No data to display

## 2023-02-06 NOTE — [place] FL2 (Signed)
[place] MEDICAID FL2 LEVEL OF CARE FORM     IDENTIFICATION  Patient Name: Samuel Mayo Birthdate: 12-13-1945 Sex: male Admission Date (Current Location): 01/24/2023  [facility] and [place] Number:  Producer, television/film/video and Address:  [facility] [place], [address], [place], [place] [address]      Provider Number: [id number]  Attending Physician Name and Address:  Burnadette Pop, MD  Relative Name and Phone Number:  Jontay Cornwell; [phone]    Current Level of Care: Hospital Recommended Level of Care: Skilled Nursing Facility Prior Approval Number:    Date Approved/Denied:   PASRR Number: [id number]  Discharge Plan: SNF    Current Diagnoses: Patient Active Problem List   Diagnosis Date Noted   Palliative care by specialist 02/05/2023   Weakness generalized 02/05/2023   Hyperbilirubinemia 02/02/2023   DNR (do not resuscitate) 02/02/2023   DNR (do not resuscitate) discussion 02/02/2023   Malnutrition of moderate degree 01/31/2023   Acute biliary pancreatitis 01/27/2023   Cholecystitis 01/25/2023   Obstructive jaundice 01/24/2023   Acute cholecystitis 12/30/2022   Routine general medical examination at a health care facility 05/07/2022   Seizure (HCC) 04/26/2022   Secondary hypercoagulable state (HCC) 10/29/2021   Atrial fibrillation, chronic (HCC)    Gout, arthritis- R MTP joint. 11/26/2017   Vitamin D deficiency, unspecified 01/30/2017   Osteoporosis 10/23/2016   Hypertension 10/23/2016   Complex partial epilepsy (HCC) 09/21/2015   Status epilepticus (HCC) 09/16/2015    Orientation RESPIRATION BLADDER Height & Weight     Self, Time, Situation, Place  Normal (Room Air) Continent, External catheter Weight: 181 lb 14.1 oz (82.5 kg) Height:  5\' 11"  (180.3 cm)  BEHAVIORAL SYMPTOMS/MOOD NEUROLOGICAL BOWEL NUTRITION STATUS    Convulsions/Seizures (Complex Partial Epilepsy) Continent Diet (Please see dc summary)  AMBULATORY STATUS  COMMUNICATION OF NEEDS Skin   Limited Assist Verbally Normal, Other (Comment) (Biliary Tube Cook slip-coat 10 Fr. RUQ (01/24/23); Biliary Tube Cook slip-coat 12 Fr. RUQ (01/31/23))                       Personal Care Assistance Level of Assistance  Bathing, Feeding, Dressing Bathing Assistance: Limited assistance Feeding assistance: Limited assistance Dressing Assistance: Limited assistance     Functional Limitations Info  Sight Sight Info: Adequate (Sclera yellow (R and L))        SPECIAL CARE FACTORS FREQUENCY  PT (By licensed PT), OT (By licensed OT)     PT Frequency: 5x OT Frequency: 5x            Contractures Contractures Info: Not present    Additional Factors Info  Code Status, Allergies Code Status Info: DNR- Limited Allergies Info: Other; Not Specified (Childhood reaction to Neosporin Ophthalmic ointment caused severe eye redness, irritation)           Current Medications (02/06/2023):  This is the current hospital active medication list Current Facility-Administered Medications  Medication Dose Route Frequency Provider Last Rate Last Admin   0.9 %  sodium chloride infusion (Manually program via Guardrails IV Fluids)   Intravenous Once Burnadette Pop, MD       acetaminophen (TYLENOL) tablet 650 mg  650 mg Oral Q6H PRN Burnadette Pop, MD   650 mg at 02/02/23 1448   amoxicillin-clavulanate (AUGMENTIN) 875-125 MG per tablet 1 tablet  1 tablet Oral Q12H Burnadette Pop, MD   1 tablet at 02/06/23 1033   diclofenac Sodium (VOLTAREN) 1 % topical gel 2 g  2 g Topical QID PRN Anthoney Harada, NP       divalproex (DEPAKOTE) DR tablet 750 mg  750 mg Oral BID Synetta Fail, MD   750 mg at 02/06/23 1034   feeding supplement (ENSURE ENLIVE / ENSURE PLUS) liquid 237 mL  237 mL Oral BID BM Adhikari, Amrit, MD   237 mL at 02/06/23 1431   HYDROmorphone (DILAUDID) injection 0.5 mg  0.5 mg Intravenous Q4H PRN Burnadette Pop, MD       midodrine (PROAMATINE) tablet 5  mg  5 mg Oral TID WC Adhikari, Amrit, MD   5 mg at 02/06/23 1145   multivitamin with minerals tablet 1 tablet  1 tablet Oral Daily Burnadette Pop, MD   1 tablet at 02/06/23 1034   polyethylene glycol (MIRALAX / GLYCOLAX) packet 17 g  17 g Oral Daily PRN Synetta Fail, MD       sodium chloride flush (NS) 0.9 % injection 3 mL  3 mL Intravenous Q12H Synetta Fail, MD   3 mL at 02/05/23 2154   sodium chloride flush (NS) 0.9 % injection 5 mL  5 mL Intracatheter Q8H Gilmer Mor, DO   5 mL at 02/06/23 6295     Discharge Medications: Please see discharge summary for a list of discharge medications.  Relevant Imaging Results:  Relevant Lab Results:   Additional Information SS# [id number]  Marliss Coots, LCSW

## 2023-02-07 ENCOUNTER — Inpatient Hospital Stay (HOSPITAL_COMMUNITY): Payer: Medicare Other

## 2023-02-07 DIAGNOSIS — Z515 Encounter for palliative care: Secondary | ICD-10-CM | POA: Diagnosis not present

## 2023-02-07 DIAGNOSIS — Z7189 Other specified counseling: Secondary | ICD-10-CM | POA: Diagnosis not present

## 2023-02-07 DIAGNOSIS — K831 Obstruction of bile duct: Secondary | ICD-10-CM | POA: Diagnosis not present

## 2023-02-07 HISTORY — PX: IR ENDOLUMINAL BX OF BILIARY TREE: IMG6053

## 2023-02-07 HISTORY — PX: IR INT EXT BILIARY DRAIN WITH CHOLANGIOGRAM: IMG6044

## 2023-02-07 HISTORY — PX: IR EXCHANGE BILIARY DRAIN: IMG6046

## 2023-02-07 LAB — COMPREHENSIVE METABOLIC PANEL
ALT: 116 U/L — ABNORMAL HIGH (ref 0–44)
AST: 112 U/L — ABNORMAL HIGH (ref 15–41)
Albumin: 1.8 g/dL — ABNORMAL LOW (ref 3.5–5.0)
Alkaline Phosphatase: 276 U/L — ABNORMAL HIGH (ref 38–126)
Anion gap: 12 (ref 5–15)
BUN: 30 mg/dL — ABNORMAL HIGH (ref 8–23)
CO2: 23 mmol/L (ref 22–32)
Calcium: 8.9 mg/dL (ref 8.9–10.3)
Chloride: 95 mmol/L — ABNORMAL LOW (ref 98–111)
Creatinine, Ser: 0.92 mg/dL (ref 0.61–1.24)
GFR, Estimated: >60 mL/min (ref >60)
Glucose, Bld: 142 mg/dL — ABNORMAL HIGH (ref 70–99)
Potassium: 3.9 mmol/L (ref 3.5–5.1)
Sodium: 130 mmol/L — ABNORMAL LOW (ref 135–145)
Total Bilirubin: 14.7 mg/dL — ABNORMAL HIGH (ref <1.2)
Total Protein: 5.6 g/dL — ABNORMAL LOW (ref 6.5–8.1)

## 2023-02-07 LAB — CULTURE, BLOOD (ROUTINE X 2)
Culture: NO GROWTH
Culture: NO GROWTH

## 2023-02-07 LAB — CBC
HCT: 28.4 % — ABNORMAL LOW (ref 39.0–52.0)
Hemoglobin: 9.3 g/dL — ABNORMAL LOW (ref 13.0–17.0)
MCH: 33.9 pg (ref 26.0–34.0)
MCHC: 32.7 g/dL (ref 30.0–36.0)
MCV: 103.6 fL — ABNORMAL HIGH (ref 80.0–100.0)
Platelets: 241 10*3/uL (ref 150–400)
RBC: 2.74 MIL/uL — ABNORMAL LOW (ref 4.22–5.81)
RDW: 17.1 % — ABNORMAL HIGH (ref 11.5–15.5)
WBC: 7.2 10*3/uL (ref 4.0–10.5)
nRBC: 0 % (ref 0.0–0.2)

## 2023-02-07 MED ORDER — FENTANYL CITRATE (PF) 100 MCG/2ML IJ SOLN
INTRAMUSCULAR | Status: AC
  Filled 2023-02-07: qty 2

## 2023-02-07 MED ORDER — LIDOCAINE-EPINEPHRINE 1 %-1:100000 IJ SOLN
20.0000 mL | Freq: Once | INTRAMUSCULAR | Status: AC
Start: 2023-02-07 — End: 2023-02-07
  Administered 2023-02-07: 20 mL
  Filled 2023-02-07: qty 20

## 2023-02-07 MED ORDER — MEPERIDINE HCL 25 MG/ML IJ SOLN
INTRAMUSCULAR | Status: AC
  Filled 2023-02-07: qty 1

## 2023-02-07 MED ORDER — FENTANYL CITRATE (PF) 100 MCG/2ML IJ SOLN
INTRAMUSCULAR | Status: AC | PRN
Start: 2023-02-07 — End: 2023-02-07
  Administered 2023-02-07 (×3): 25 ug via INTRAVENOUS

## 2023-02-07 MED ORDER — ACETAMINOPHEN 325 MG PO TABS
650.0000 mg | ORAL_TABLET | ORAL | Status: DC | PRN
  Administered 2023-02-07 – 2023-02-21 (×8): 650 mg via ORAL
  Filled 2023-02-07 (×9): qty 2

## 2023-02-07 MED ORDER — SODIUM CHLORIDE 0.9 % IV SOLN: INTRAVENOUS | Status: AC

## 2023-02-07 MED ORDER — MIDAZOLAM HCL 2 MG/2ML IJ SOLN
INTRAMUSCULAR | Status: AC
  Filled 2023-02-07: qty 2

## 2023-02-07 MED ORDER — LIDOCAINE-EPINEPHRINE 1 %-1:100000 IJ SOLN
INTRAMUSCULAR | Status: AC
  Filled 2023-02-07: qty 1

## 2023-02-07 MED ORDER — MEPERIDINE HCL 25 MG/ML IJ SOLN
INTRAMUSCULAR | Status: AC | PRN
Start: 2023-02-07 — End: 2023-02-07
  Administered 2023-02-07 (×2): 25 mg via INTRAVENOUS

## 2023-02-07 MED ORDER — MIDAZOLAM HCL 2 MG/2ML IJ SOLN
INTRAMUSCULAR | Status: AC | PRN
  Administered 2023-02-07 (×4): .5 mg via INTRAVENOUS
  Administered 2023-02-07: 1 mg via INTRAVENOUS

## 2023-02-07 MED ORDER — PIPERACILLIN-TAZOBACTAM 3.375 G IVPB
3.3750 g | Freq: Three times a day (TID) | INTRAVENOUS | Status: DC
  Administered 2023-02-07 – 2023-02-10 (×9): 3.375 g via INTRAVENOUS
  Filled 2023-02-07 (×12): qty 50

## 2023-02-07 MED ORDER — SODIUM CHLORIDE 0.9 % IV SOLN
INTRAVENOUS | Status: AC | PRN
  Administered 2023-02-07: 2 g via INTRAVENOUS

## 2023-02-07 MED ORDER — SODIUM CHLORIDE 0.9 % IV SOLN
INTRAVENOUS | Status: AC
  Filled 2023-02-07: qty 20

## 2023-02-07 MED ORDER — IOHEXOL 300 MG/ML  SOLN
50.0000 mL | Freq: Once | INTRAMUSCULAR | Status: AC | PRN
  Administered 2023-02-07: 50 mL

## 2023-02-07 MED ORDER — SODIUM CHLORIDE 0.9 % IV SOLN
2.0000 g | INTRAVENOUS | Status: AC
  Filled 2023-02-07: qty 20

## 2023-02-07 MED ORDER — LIDOCAINE-EPINEPHRINE 1 %-1:100000 IJ SOLN
INTRAMUSCULAR | Status: AC
Start: 2023-02-07 — End: ?
  Filled 2023-02-07: qty 1

## 2023-02-07 NOTE — Progress Notes (Signed)
Daily Progress Note   Patient Name: Samuel Mayo [facility]       Date: 02/07/2023 DOB: [birth year]  Age: 77 y.o. MRN#: [id number] Attending Physician: Burnadette Pop, MD Primary Care Physician: [name], [name], MD Admit Date: 01/24/2023  Reason for Consultation/Follow-up: Establishing goals of care  Length of Stay: 12  Current Medications: Scheduled Meds:   sodium chloride   Intravenous Once   amoxicillin-clavulanate  1 tablet Oral Q12H   divalproex  750 mg Oral BID   feeding supplement  237 mL Oral BID BM   midodrine  5 mg Oral TID WC   multivitamin with minerals  1 tablet Oral Daily   sodium chloride flush  3 mL Intravenous Q12H   sodium chloride flush  5 mL Intracatheter Q8H    Continuous Infusions:  cefTRIAXone (ROCEPHIN)  IV      PRN Meds: acetaminophen, diclofenac Sodium, HYDROmorphone (DILAUDID) injection, polyethylene glycol  Physical Exam Vitals reviewed.  Constitutional:      Appearance: He is ill-appearing.  HENT:     Head: Normocephalic and atraumatic.  Pulmonary:     Effort: Tachypnea present.  Skin:    Coloration: Skin is jaundiced.  Neurological:     Mental Status: He is alert and oriented to person, place, and time.             Vital Signs: BP 137/85   Pulse (!) 114   Temp 98.6 F (37 C) (Oral)   Resp (!) 23   Ht 5\' 11"  (1.803 m)   Wt 82.5 kg   SpO2 97%   BMI 25.37 kg/m  SpO2: SpO2: 97 % O2 Device: O2 Device: Room Air O2 Flow Rate: O2 Flow Rate (L/min): 2 L/min    Patient Active Problem List   Diagnosis Date Noted   Palliative care by specialist 02/05/2023   Weakness generalized 02/05/2023   Hyperbilirubinemia 02/02/2023   DNR (do not resuscitate) 02/02/2023   DNR (do not resuscitate) discussion 02/02/2023   Malnutrition of moderate  degree 01/31/2023   Acute biliary pancreatitis 01/27/2023   Cholecystitis 01/25/2023   Obstructive jaundice 01/24/2023   Acute cholecystitis 12/30/2022   Routine general medical examination at a health care facility 05/07/2022   Seizure (HCC) 04/26/2022   Secondary hypercoagulable state (HCC) 10/29/2021   Atrial fibrillation, chronic (HCC)    Gout,  arthritis- R MTP joint. 11/26/2017   Vitamin D deficiency, unspecified 01/30/2017   Osteoporosis 10/23/2016   Hypertension 10/23/2016   Complex partial epilepsy (HCC) 09/21/2015   Status epilepticus (HCC) 09/16/2015    Palliative Care Assessment & Plan   Patient Profile: 77 y.o. male  with past medical history of a-fib / flutter on Eliquis, mitral valve annuloplasty, HTN, and seizure disorder. 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 Underwent percutaneous cholecystomy tube placed. Discharged 01/03/23 on antibiotics. Saw PCP, found to have worsening jaundice. Went for drain study 11/22 and there was non-visualization of CBD so readmitted on 11/22. GI saw on 11/23 for jaundice. MRI/MRCP showed increased mild to moderate intrahepatic bile duct dilation and new mural thickening and enhancement of the extrahepatic bile duct suspicious for cholangitis.   On 12/1,patient became febrile last night, hypotensive, tachycardic. Underlying sepsis suspected. Now clinically improving. Current plan is to continue IV antibiotics, follow-up cultures, allowing improvement in the liver function.   Today's Discussion: Patient and family member at bedside. Patient reports feeling tired. We briefly discussed his hospital course and his goal to treat the treatable with the hopes of outpatient follow up at [facility]. We discussed the possibility of skilled nursing facility at discharge.   Patient and family face ongoing treatment option decisions, advanced directive decisions and anticipatory care needs.   Encouraged family  to call PMT with questions or concerns.  Recommendations/Plan: DNR/DNI Treat the treatable Goal to have outpatient follow up at [facility] SN[facility] at discharge? Continued PMT support   Code Status:    Code Status Orders  (From admission, onward)           Start     Ordered   02/02/23 0923  Do not attempt resuscitation (DNR)- Limited -Do Not Intubate (DNI)  (Code Status)  Continuous       Question Answer Comment  If pulseless and not breathing No CPR or chest compressions.   In Pre-Arrest Conditions (Patient Is Breathing and Has A Pulse) Do not intubate. Provide all appropriate non-invasive medical interventions. Avoid ICU transfer unless indicated or required.   Consent: Discussion documented in EHR or advanced directives reviewed      02/02/23 0922         Extensive chart review has been completed prior to seeing the patient including labs, vital signs, imaging, progress/consult notes, orders, medications, and available advance directive documents.  Time spent: 25 minutes  Thank you for allowing the Palliative Medicine Team to assist in the care of this patient.    Sherryll Burger, NP  Please contact Palliative Medicine Team phone at [phone] for questions and concerns.

## 2023-02-07 NOTE — Progress Notes (Addendum)
Nutrition Follow-up  DOCUMENTATION CODES:   Non-severe (moderate) malnutrition in context of acute illness/injury  INTERVENTION:   - Provide Ensure Enlive BID, each supplement provides 350 kcal and 20 grams of protein.  - Provide Mighty Shake TID with meals, each supplement provides 330 kcals and 9 grams of protein  - Provide MVI with minerals and snacks daily  - Promote good PO intake on regular diet  - If continued Poor PO intake recommend initiation of nutrition support   NUTRITION DIAGNOSIS:   Severe Malnutrition related to acute illness as evidenced by mild muscle depletion, mild fat depletion.  - Ongoing, but being addressed with oral nutritional supplements and snacks   GOAL:   Patient will meet greater than or equal to 90% of their needs  - Ongoing  MONITOR:   PO intake, Supplement acceptance, Labs  REASON FOR ASSESSMENT:   NPO/Clear Liquid Diet    ASSESSMENT:   Patient was recently admitted from 10/28 -11/1 for cholecystitis, diagnosed with Mirizzi syndrome ,underwent cholecystostomy. Readmitted 11/22 due to occlusion of the cystic duct and worsening jaundice. PMH of HTN, Afib, gout, congential mitral valve disease  10/31 - cholecystostomy tube placement  11/22- Readmitted, NPO 11/23 - Clears -> Heart healthy 11/24 - NPO 11/27 - Clears 11/28 - Regular 11/29 - PTC with R side biliary drain placement, NPO -> Regular  12/6 - L side bilary drain placement NPO -> Regular   Pt just came back from left-sided biliary drain placement and was sleeping on visit. No family present. RN states he has not been eating much since yesterday, around 25-50% of his meals and is drinking some of his Ensures. PO intake varies as intake is being recorded as 25-100% of meals consumed. Weight is trending down.   Per MD note, "Patient might have underlying biliary tumor which looks like unresectable as per general surgery. Poor prognosis. CODE STATUS changed to DNR. Might be a  candidate for hospice/comfort care if further declines."  Admit weight: 86.2 kg - stated? Current weight: 82.5 kg  02/05/23 82.5 kg  01/13/23 85.8 kg  01/03/23 91.3 kg  12/23/22 89.4 kg  10/21/22 90.2 kg  08/22/22 89.6 kg  07/22/22 92.1 kg  07/17/22 93.2 kg  05/20/22 92.5 kg  05/07/22 91.6 kg     Average Meal Intake: 11/29-12/4: 75% intake x 6 recorded meals  Nutritionally Relevant Medications: Scheduled Meds:  amoxicillin-clavulanate  1 tablet Oral Q12H   multivitamin with minerals  1 tablet Oral Daily   Continuous Infusions:  cefTRIAXone (ROCEPHIN)  IV     piperacillin-tazobactam (ZOSYN)  IV 3.375 g (02/07/23 1417)    Labs Reviewed: Sodium 130, Chloride 95, BUN 30  Diet Order:   Diet Order             Diet regular Room service appropriate? Yes; Fluid consistency: Thin  Diet effective now                   EDUCATION NEEDS:   Education needs have been addressed  Skin:  Skin Assessment: Reviewed RN Assessment  Last BM:  02/06/2023, type 6, medium  Height:   Ht Readings from Last 1 Encounters:  01/24/23 5\' 11"  (1.803 m)    Weight:   Wt Readings from Last 1 Encounters:  02/05/23 82.5 kg    Ideal Body Weight:  78.2 kg  BMI:  Body mass index is 25.37 kg/m.  Estimated Nutritional Needs:   Kcal:  1900-2100 kcal  Protein:  90-100 gm  Fluid:  >  1.9 L   Elliot Dally, RD Registered Dietitian  See Amion for more information

## 2023-02-07 NOTE — Progress Notes (Signed)
Referring Physician(s): Dr. Leonides Schanz  Supervising Physician: Mir, Mauri Reading  Patient Status:  [facility] - In-pt  Chief Complaint:  S/p perc chole placement on 10/31; exchanged 11/22 F/u cholangiogram on 11/22 showed persistent occlusion of the cystic duct without opacification of the CBD. Admitted on 11/22 with elevated T bili and jaundice, ERCP was unsuccessful  S/p int/ext bili drain placement in IR on 11/29  Subjective: Patient awake/alert resting comfortably in bed. His significant other and son are at the bedside. Patient denies pain/discomfort.   Allergies: Other  Medications: Prior to Admission medications   Medication Sig Start Date End Date Taking? Authorizing Provider  amLODipine (NORVASC) 5 MG tablet TAKE 1 TABLET (5 MG TOTAL) BY MOUTH DAILY. 01/11/22  Yes [name], [name], MD  Cholecalciferol (VITAMIN D-3 PO) Take 1 capsule by mouth every evening.   Yes [provider]  divalproex (DEPAKOTE) 250 MG DR tablet Take 1 tablet (250 mg total) by mouth 2 (two) times daily. Take in addition to 500 mg twice a day (pt should have a total of 750 mg twice a day) 08/26/22  Yes Micki Riley, MD  divalproex (DEPAKOTE) 500 MG DR tablet Take 1 tablet (500 mg total) by mouth 2 (two) times daily. 05/20/22  Yes Micki Riley, MD  ELIQUIS 5 MG TABS tablet TAKE 1 TABLET BY MOUTH TWICE A DAY 08/07/22  Yes Mealor, Roberts Gaudy, MD  gabapentin (NEURONTIN) 300 MG capsule Take 1 capsule (300 mg total) by mouth at bedtime. 09/16/22  Yes Micki Riley, MD  ibandronate (BONIVA) 150 MG tablet TAKE 1 TABLET BY MOUTH EVERY 30 DAYS. TAKE IN AM WITH FULL GLASS OF WATER ON EMPTY STOMACH AND DON'T TAKE ANYTHING ELSE BY MOUTH OR LIE DOWN FOR THE NEXT 30 MINUTES Patient taking differently: Take 150 mg by mouth See admin instructions. Take 1 tablet (150mg ) once a month around the 20th. 07/22/22  Yes [name], [name], MD  LUTEIN PO Take 1 tablet by mouth every evening.   Yes [provider]  metoprolol  succinate (TOPROL-XL) 25 MG 24 hr tablet TAKE 1 TABLET (25 MG TOTAL) BY MOUTH DAILY. 09/23/22  Yes BranchAlben Spittle, MD  Multiple Vitamin (MULTIVITAMIN WITH MINERALS) TABS tablet Take 1 tablet by mouth every evening.   Yes [provider]  OVER THE COUNTER MEDICATION Take 1 tablet by mouth 2 (two) times daily. Bone Up   Yes [provider]  amoxicillin-clavulanate (AUGMENTIN) 875-125 MG tablet Take 1 tablet by mouth 2 (two) times daily. Patient not taking: Reported on 01/24/2023    [provider]     Vital Signs: BP 108/73 (BP Location: Right Arm)   Pulse 91   Temp 98.2 F (36.8 C) (Oral)   Resp 12   Ht 5\' 11"  (1.803 m)   Wt 181 lb 14.1 oz (82.5 kg)   SpO2 95%   BMI 25.37 kg/m   Physical Exam Constitutional:      General: He is not in acute distress.    Appearance: He is ill-appearing.  HENT:     Mouth/Throat:     Mouth: Mucous membranes are moist.     Pharynx: Oropharynx is clear.  Pulmonary:     Effort: Pulmonary effort is normal.  Abdominal:     Palpations: Abdomen is soft.     Tenderness: There is no abdominal tenderness.     Comments: Two right upper quadrant drains to gravity. Skin insertion sites not viewed. Dressing clean/dry/intact.   Skin:  Coloration: Skin is jaundiced.  Neurological:     Mental Status: He is alert and oriented to person, place, and time.  Psychiatric:        Mood and Affect: Mood normal.        Behavior: Behavior normal.        Thought Content: Thought content normal.        Judgment: Judgment normal.     Imaging: No results found.  Labs:  CBC: Recent Labs    02/02/23 0801 02/02/23 1117 02/03/23 0208 02/07/23 0204  WBC 12.7* 9.6 6.8 7.2  HGB 6.8* 10.9* 9.1* 9.3*  HCT 19.7* 31.7* 26.6* 28.4*  PLT 250 220 183 241    COAGS: Recent Labs    12/30/22 2151 12/31/22 0705 12/31/22 1747 01/01/23 1154 01/25/23 0548 01/31/23 0516  INR 1.3*  --   --   --  1.2 1.1  APTT 36 34 61* 43*  --   --      BMP: Recent Labs    02/04/23 0216 02/05/23 0205 02/06/23 0203 02/07/23 0204  NA 129* 129* 129* 130*  K 3.8 3.7 3.9 3.9  CL 96* 96* 94* 95*  CO2 23 24 23 23   GLUCOSE 102* 101* 109* 142*  BUN 70* 40* 37* 30*  CALCIUM 8.5* 8.5* 8.7* 8.9  CREATININE 1.66* 1.13 0.95 0.92  GFRNONAA 42* >60 >60 >60    LIVER FUNCTION TESTS: Recent Labs    02/04/23 0216 02/05/23 0205 02/06/23 0203 02/07/23 0204  BILITOT 14.8* 14.8* 14.7* 14.7*  AST 64* 71* 98* 112*  ALT 74* 77* 100* 116*  ALKPHOS 213* 222* 219* 276*  PROT 5.0* 5.1* 5.5* 5.6*  ALBUMIN 1.7* 1.7* 1.8* 1.8*    Assessment and Plan:  S/p perc chole placement on 10/31, exchanged 11/22 F/u cholangiogram on 11/22 showed persistent occlusion of the cystic duct without opacification of the CBD. Admitted on 11/22 with elevated T bili and jaundice, ERCP was unsuccessful  S/p int/ext bili drain placement in IR on 11/29  Drain Location: RUQ Size: 10 and 12  Date of placement: 11/22 and 11/29  Currently to: Drain collection device: gravity 24 hour output:  Output by Drain (mL) 02/05/23 0700 - 02/05/23 1459 02/05/23 1500 - 02/05/23 2259 02/05/23 2300 - 02/06/23 0659 02/06/23 0700 - 02/06/23 1459 02/06/23 1500 - 02/06/23 2259 02/06/23 2300 - 02/07/23 0659 02/07/23 0700 - 02/07/23 0852  Biliary Tube Cook slip-coat 10 Fr. RUQ 50 75 0 30 5 20    Biliary Tube Cook slip-coat 12 Fr. RUQ 725 300 125 240 225 200     Interval imaging/drain manipulation:  None  Current examination: Dressed appropriately.   Plan:  Dr. Loreta Ave met with the patient and his son at the bedside yesterday afternoon. They discussed his persistently elevated T. Bili and the concern that there could be a possible malignancy within the ducts. Dr. Loreta Ave recommended a left-sided biliary drain for better decompression as well as a brush biopsy during the procedure. The patient requested time to think through his options and this morning he has agreed to the drain  placement.   The procedure was again discussed this morning with the patient and his significant other and written consent was signed by the patient. They are aware the procedure will be performed by Dr. Bryn Gulling.   Risks and benefits were discussed with the patient including, but not limited to, bleeding, infection, bile leak, sepsis or even death.  All of the patient's questions were answered, patient is agreeable to proceed.  He has been NPO. He has not been receiving any blood-thinning medications.   Consent signed and in IR.   Drain care: Continue TID flushes with 5 cc NS. Record output Q shift. Dressing changes QD or PRN if soiled.  Call IR APP or on call IR MD if difficulty flushing or sudden change in drain output.  Repeat imaging/possible drain injection once output < 10 mL/QD (excluding flush material). Consideration for drain removal if output is < 10 mL/QD (excluding flush material), pending discussion with the providing surgical service.  Discharge planning: Please contact IR APP or on call IR MD prior to patient d/c to ensure appropriate follow up plans are in place. Typically patient will follow up with IR clinic 10-14 days post d/c for repeat imaging/possible drain injection. IR scheduler will contact patient with date/time of appointment. Patient will need to flush drain QD with 5 cc NS, record output QD, dressing changes every 2-3 days or earlier if soiled.   IR will continue to follow - please call with questions or concerns.  Electronically Signed: Alwyn Ren, AGACNP-BC [phone] 02/07/2023, 8:18 AM   I spent a total of 25 Minutes at the the patient's bedside AND on the patient's hospital floor or unit, greater than 50% of which was counseling/coordinating care for biliary obstruction.

## 2023-02-07 NOTE — Procedures (Signed)
Interventional Radiology Procedure Note  Procedure:  Biliary brushings through existing percutaneous access New 10 fr left biliary internal external drain placement Replacement of 12 fr right internal external biliary drain  Indication: Biliary obstruction  Findings: Please refer to procedural dictation for full description.  Complications: None  EBL: < 10 mL  Acquanetta Belling, MD [phone]

## 2023-02-07 NOTE — Progress Notes (Signed)
Pharmacy Antibiotic Note Samuel Mayo is a 77 y.o. male admitted on 01/24/2023 with acute cholecystitis and abscess s/p biliary drain placement 11/29. He was on zosyn then transitioned to Augmentin now s/p L biliary drain placement  12/6 with rigors post procedure. Plans are to restart zosyn.  -Afebrile -Scr 0.9 -blood cx negative on 12/1 and new cultures ordered  Plan: -Restart zosyn 3.375 g IV q8h -Will follow renal function, cultures, and clinical progress  Height: 5\' 11"  (180.3 cm) Weight: 82.5 kg (181 lb 14.1 oz) IBW/kg (Calculated) : 75.3  Temp (24hrs), Avg:98.4 F (36.9 C), Min:98.2 F (36.8 C), Max:98.6 F (37 C)  Recent Labs  Lab 02/01/23 2108 02/02/23 0801 02/02/23 1117 02/03/23 0208 02/04/23 0216 02/05/23 0205 02/06/23 0203 02/07/23 0204  WBC  --  12.7* 9.6 6.8  --   --   --  7.2  CREATININE  --  1.18  --  1.67* 1.66* 1.13 0.95 0.92  LATICACIDVEN 1.7 1.2  --   --   --   --   --   --     Estimated Creatinine Clearance: 70.5 mL/min (by C-G formula based on SCr of 0.92 mg/dL).    Allergies  Allergen Reactions   Other Other (See Comments)    Childhood reaction to Neosporin Ophthalmic ointment caused severe eye redness, irritation   Antimicrobials this admission:  Unasyn 11/23 >> 11/30 Augmentin 11/30 >> 12/1 Vanc 12/1 >> 12/5 Zosyn 12/1 >> 12/5 Augmentin 12/5>> 12/6 Zosyn 12/6>>  Microbiology results:  12/1 Bcx: neg  Harland German, PharmD Clinical Pharmacist **Pharmacist phone directory can now be found on [email or web address] ([email or web address]).  Listed under [facility].

## 2023-02-07 NOTE — Progress Notes (Signed)
PROGRESS NOTE  Samuel Mayo  ZOX:[id number] DOB: November 28, 1945 DOA: 01/24/2023 PCP: [name], [name], MD   Brief Narrative: Patient is a 77 year old male with history of hypertension, atrial fibrillation, seizures, gout, MVR s/p angioplasty who presented with worsening jaundice.  Patient was recently admitted from 10/28 -11/1 for cholecystitis, diagnosed with Mirizzi syndrome ,underwent cholecystostomy.  Follow-up imaging showed persistent occlusion of the cystic duct, elevated liver enzymes so recommended to go to the emergency department.  Patient also  reported decreased appetite, weakness.  On presentation, he was hemodynamically stable.  Lab work showed sodium of 127, lipase of 1228, elevated liver enzymes, bilirubin of 20.  General surgery, GI consulted.  Attempted  ERCP but turned out to be unsuccessful.  IR did PTC with biliary drain placement on 11/29.  On 12/1,patient became febrile last night, hypotensive, tachycardic.  Underlying sepsis suspected.  Critical care , palliaitive care consulted.  Now clinically improving.  IR placed  left-sided biliary drain on 12/6. Plan for SnF  Assessment & Plan:  Principal Problem:   Obstructive jaundice Active Problems:   Atrial fibrillation, chronic (HCC)   Hypertension   Seizure (HCC)   Cholecystitis   Acute biliary pancreatitis   Malnutrition of moderate degree   Hyperbilirubinemia   DNR (do not resuscitate)   DNR (do not resuscitate) discussion   Palliative care by specialist   Weakness generalized   Obstructive jaundice: Initially admitted on 10/28 when he presented with jaundice.  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  Underwent percutaneous cholecystomy tube placement on 10/31 and was discharged home with oral antibiotics.  Outpatient labs showed persistently elevated liver enzymes. Drain study on 11/22 by IR showed persistent occlusion of cystic duct and no opacification of CBD.  MRCP showed  dilated intrahepatic bile duct with thickening of the extrahepatic bile duct, suspicious for cholangitis.  Continues to have severe elevated bilirubin, alkaline phosphatase, hyponatremia.  General surgery, GI consulted.  Currently on Unasyn. Denies abdomen pain, nausea or vomiting today.  Remains intensely jaundiced. Attempted  ERCP on 11/27  but turned out to be unsuccessful.   IR did PTC with biliary drain placement on 11/29.  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    He will follow-up with Dr. Allen(hepatobiliary surgery) as an  outpatient at [facility].    Bilirubin has plateaued in the range of 14.IR placed left-sided biliary drain on 12/6.  Follow-up cytology sent from biliary brushings  Sepsis: Became febrile since the night of 11/30, developed leukocytosis.  Lactate level normal. CT abdomen/pelvis ordered without finding of any acute problem.  Culture ordered, no growth till date.  He remains  afebrile this morning.  No leukocytosis.  Antibiotics changed to Augmentin but after the left-sided biliary drain placement, he developed rigors.  Getting blood cultures again, restarted Zosyn.  Hyponatremia: Baseline sodium runs low in the range of 120-132.  Sodium dropped to the range of 122, prompting nephrology consult.  Started on urea, Lasix now stopped.  Likely SIADH.  Sodium is stable in the range of 120s.  Lasix discontinued  AKI: Likely hemodynamically mediated, patient was hypotensive .  Now resolved  Hypertension: On amlodipine, Toprol at home.  Currently on hold  Paroxysmal A-fib: On metoprolol.  Continue to monitor on telemetry.  Eliquis on hold .  Will resume tomorrow.  Remains in normal sinus rhythm at present.  Seizure disorder: On Depakote.  Elevated liver enzymes is not from Depakote but from obstructive liver disease.  History of mitral valve regurgitation: Status post annuloplasty.  Follow-up with cardiology as an outpatient  Goals of care: Palliative care,  critical care consulted.  Patient might have underlying biliary tumor which looks like unresectable as per general surgery.  Poor prognosis.  CODE STATUS changed to DNR.  Might be a candidate for hospice/comfort care if further declines.    Deconditioning/debility: PT/OT recommending home health.  Wife requesting for a SNF.  Now the plan  is for SNF, TOC following   Nutrition Problem: Moderate Malnutrition Etiology: acute illness    DVT prophylaxis:SCDs Start: 01/24/23 1337     Code Status: Limited: Do not attempt resuscitation (DNR) -DNR-LIMITED -Do Not Intubate/DNI   Family Communication: Son at  bedside on 12/6  Patient status:Inpatient  Patient is from :Home  Anticipated discharge to:  SnF  Estimated DC date: 2-3 days    Consultants: GI, general surgery, PCCM, palliative care  Procedures: ERCP, PTC  Antimicrobials:  Anti-infectives (From admission, onward)    Start     Dose/Rate Route Frequency Ordered Stop   02/07/23 1002  cefTRIAXone (ROCEPHIN) 2 g in sodium chloride 0.9 % 100 mL IVPB        over 30 Minutes Intravenous Continuous PRN 02/07/23 1011 02/07/23 1017   02/07/23 0945  cefTRIAXone (ROCEPHIN) 2 g in sodium chloride 0.9 % 100 mL IVPB        2 g 200 mL/hr over 30 Minutes Intravenous To Radiology 02/07/23 0845 02/08/23 0945   02/06/23 1000  amoxicillin-clavulanate (AUGMENTIN) 875-125 MG per tablet 1 tablet        1 tablet Oral Every 12 hours 02/05/23 1108 02/22/23 0959   02/05/23 1000  Vancomycin (VANCOCIN) 1,500 mg in sodium chloride 0.9 % 500 mL IVPB        1,500 mg 250 mL/hr over 120 Minutes Intravenous Every 24 hours 02/05/23 0903 02/05/23 1229   02/04/23 1000  vancomycin (VANCOCIN) IVPB 1000 mg/200 mL premix  Status:  Discontinued        1,000 mg 200 mL/hr over 60 Minutes Intravenous Every 24 hours 02/03/23 1122 02/05/23 0903   02/03/23 1000  Vancomycin (VANCOCIN) 1,500 mg in sodium chloride 0.9 % 500 mL IVPB  Status:  Discontinued        1,500 mg 250  mL/hr over 120 Minutes Intravenous Every 24 hours 02/02/23 1006 02/03/23 1122   02/02/23 2100  vancomycin (VANCOCIN) IVPB 1000 mg/200 mL premix  Status:  Discontinued        1,000 mg 200 mL/hr over 60 Minutes Intravenous Every 12 hours 02/02/23 0811 02/02/23 1006   02/02/23 1100  vancomycin (VANCOCIN) IVPB 1000 mg/200 mL premix       Placed in "Followed by" Linked Group   1,000 mg 200 mL/hr over 60 Minutes Intravenous  Once 02/02/23 0913 02/02/23 1500   02/02/23 1000  vancomycin (VANCOCIN) IVPB 1000 mg/200 mL premix       Placed in "Followed by" Linked Group   1,000 mg 200 mL/hr over 60 Minutes Intravenous  Once 02/02/23 0913 02/02/23 1340   02/02/23 0900  vancomycin (VANCOREADY) IVPB 2000 mg/400 mL  Status:  Discontinued        2,000 mg 200 mL/hr over 120 Minutes Intravenous  Once 02/02/23 0804 02/02/23 0913   02/02/23 0900  piperacillin-tazobactam (ZOSYN) IVPB 3.375 g        3.375 g 12.5 mL/hr over 240 Minutes Intravenous Every 8 hours 02/02/23 0805 02/05/23 2359   02/01/23 2200  amoxicillin-clavulanate (AUGMENTIN) 875-125 MG per  tablet 1 tablet  Status:  Discontinued        1 tablet Oral Every 12 hours 02/01/23 1150 02/02/23 0738   01/31/23 1058  cefOXitin (MEFOXIN) 2 g in sodium chloride 0.9 % 100 mL IVPB        over 30 Minutes  Continuous PRN 01/31/23 1058 01/31/23 1058   01/31/23 0000  cefOXitin (MEFOXIN) 2 g in sodium chloride 0.9 % 100 mL IVPB  Status:  Discontinued        2 g 200 mL/hr over 30 Minutes Intravenous To Radiology 01/30/23 1014 01/30/23 1015   01/25/23 1600  ampicillin-sulbactam (UNASYN) 1.5 g in sodium chloride 0.9 % 100 mL IVPB  Status:  Discontinued        1.5 g 200 mL/hr over 30 Minutes Intravenous Every 6 hours 01/25/23 1525 02/01/23 1150       Subjective: Patient seen and examined the bedside today.  He just came from IR after the left-sided biliary drain placement per son at bedside.  He was lying in bed but appeared to have some abdominal discomfort near  the biliary drain placement.  Also reported to have rigors but no fever.   Objective: Vitals:   02/07/23 1130 02/07/23 1135 02/07/23 1140 02/07/23 1204  BP: (!) 170/80 (!) 143/89 137/85 134/88  Pulse: 97 (!) 110 (!) 114 98  Resp: (!) 32 (!) 22 (!) 23 20  Temp:      TempSrc:      SpO2: 99% 98% 97% 97%  Weight:      Height:        Intake/Output Summary (Last 24 hours) at 02/07/2023 1323 Last data filed at 02/07/2023 1100 Gross per 24 hour  Intake 20 ml  Output 1670 ml  Net -1650 ml   Filed Weights   01/24/23 0953 02/05/23 0223  Weight: 86.2 kg 82.5 kg    Examination:   General exam: weak and deconditioned HEENT: PERRL Respiratory system:  no wheezes or crackles  Cardiovascular system: S1 & S2 heard, RRR.  Gastrointestinal system: Abdomen is nondistended, soft and tender around left drain placement area.  2 drains on the right side, 1 drain on the left side Central nervous system: Alert and oriented Extremities: No edema, no clubbing ,no cyanosis Skin: Janudice   Data Reviewed: I have personally reviewed following labs and imaging studies  CBC: Recent Labs  Lab 02/02/23 0801 02/02/23 1117 02/03/23 0208 02/07/23 0204  WBC 12.7* 9.6 6.8 7.2  HGB 6.8* 10.9* 9.1* 9.3*  HCT 19.7* 31.7* 26.6* 28.4*  MCV 98.5 100.3* 99.3 103.6*  PLT 250 220 183 241   Basic Metabolic Panel: Recent Labs  Lab 02/03/23 0208 02/04/23 0216 02/05/23 0205 02/06/23 0203 02/07/23 0204  NA 125* 129* 129* 129* 130*  K 3.4* 3.8 3.7 3.9 3.9  CL 92* 96* 96* 94* 95*  CO2 24 23 24 23 23   GLUCOSE 94 102* 101* 109* 142*  BUN 56* 70* 40* 37* 30*  CREATININE 1.67* 1.66* 1.13 0.95 0.92  CALCIUM 8.3* 8.5* 8.5* 8.7* 8.9     Recent Results (from the past 240 hour(s))  Culture, blood (Routine X 2) w Reflex to ID Panel     Status: None   Collection Time: 02/02/23  8:03 AM   Specimen: BLOOD LEFT ARM  Result Value Ref Range Status   Specimen Description BLOOD LEFT ARM  Final   Special Requests    Final    BOTTLES DRAWN AEROBIC AND ANAEROBIC Blood Culture results may not be  optimal due to an inadequate volume of blood received in culture bottles   Culture   Final    NO GROWTH 5 DAYS Performed at [facility], [address] [place], [place] [address]    Report Status 02/07/2023 FINAL  Final  Culture, blood (Routine X 2) w Reflex to ID Panel     Status: None   Collection Time: 02/02/23  8:04 AM   Specimen: BLOOD LEFT ARM  Result Value Ref Range Status   Specimen Description BLOOD LEFT ARM  Final   Special Requests   Final    BOTTLES DRAWN AEROBIC AND ANAEROBIC Blood Culture results may not be optimal due to an inadequate volume of blood received in culture bottles   Culture   Final    NO GROWTH 5 DAYS Performed at [facility], [address], [place], [place] [address]    Report Status 02/07/2023 FINAL  Final     Radiology Studies: CT ABDOMEN W CONTRAST  Result Date: 02/07/2023 CLINICAL DATA:  77 year old male with a history of biliary obstruction. The original ultrasound 12/26/2022 demonstrates evidence of acute cholecystitis, which was present on a follow-up MRI 12/29/2022. Following this a percutaneous cholecystostomy was placed 01/02/2023 and replaced 01/24/2023. Ongoing symptoms of jaundice and hyperbilirubinemia prompted a second MRI 01/24/2023. MR suggested cholangitis given some enhancement along the ductal system, undrained intrahepatic bile ducts. Internal external biliary drain was placed 01/31/2023. 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 A follow-up CT 01/31/2023 and 02/02/2023 performed, with no radiopaque gallstones accounting for biliary obstruction. The patient has had ongoing hyperbilirubinemia which has plateaued at the level of the proximally 14 and remains symptomatic. He presents for this CT study  as a planning study preoperatively 4 left-sided biliary drainage. EXAM: CT ABDOMEN WITH CONTRAST TECHNIQUE: Multidetector CT imaging of the abdomen was performed using the standard protocol following bolus administration of intravenous contrast. RADIATION DOSE REDUCTION: This exam was performed according to the departmental dose-optimization program which includes automated exposure control, adjustment of the mA and/or kV according to patient size and/or use of iterative reconstruction technique. CONTRAST:  75mL OMNIPAQUE IOHEXOL 350 MG/ML SOLN COMPARISON:  Ultrasound 12/26/2022, MR 12/29/2022, per coli 01/02/2023, MR 01/24/2023, CT 01/31/2023, CT 02/02/2023 PTC and drainage 01/31/2023 FINDINGS: Lower chest: No acute finding of the lower chest. Minimal atelectasis and trace fluid. Hepatobiliary: Left-sided intrahepatic biliary ductal dilatation. There is associated periportal edema. There are a few small mildly dilated intrahepatic ducts of the right liver, particularly of the segment 8. Percutaneous transhepatic internal/external biliary drain via right-sided approach remains adequately position with the radiopaque marker in the bile ducts and the term in a shin of the drain in the small bowel. Percutaneous cholecystostomy tube in place within the fundus of the gallbladder. No radiopaque stones. There are no radiopaque stones identified along the course of the internal/external biliary drain. Region of hypodensity/hypoenhancement at the confluence of the left and right biliary ducts, at the liver hilum on image 19 of series 3. This region measures 13 mm x 42 mm on the axial images. No cirrhotic changes Pancreas: Attenuation/enhancement of the pancreas within normal limits. Mild ductal dilatation of the pancreatic duct. Spleen: Unremarkable Adrenals/Urinary Tract: - Right adrenal gland:  Unremarkable - Left adrenal gland: Unremarkable. - Right kidney: No hydronephrosis, nephrolithiasis, inflammation, or ureteral  dilation. No focal lesion. - Left Kidney: No hydronephrosis,  nephrolithiasis, inflammation, or ureteral dilation. No focal lesion. Stomach/Bowel: - Stomach: Small hiatal hernia.  Otherwise unremarkable stomach. 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 Internal/external biliary drain terminates in the duodenum. Visualized small bowel and colon unremarkable with no distension. Vascular/Lymphatic: Calcifications of the abdominal aorta. Mesenteric arteries and renal arteries patent. Other: None Musculoskeletal: Unchanged configuration of the visualized vertebral bodies including compression fracture of L1 favored to be chronic. No bony canal narrowing. IMPRESSION: Right-sided internal/external biliary drain in adequate position across the right-sided biliary ducts and into the duodenum. The majority of the right-sided ductal system is decompressed, with some mild residual ductal dilatation involving segment 8. Persistent left-sided intrahepatic biliary ductal dilatation. Nonspecific focus of low attenuation/hypoenhancement at the confluence of the left and right-sided biliary ducts in the liver hilum. This may reflect focal edema, however, a Klatskin tumor cannot be excluded as etiology for the ductal dilatation. Percutaneous cholecystostomy in position in the gallbladder. Aortic Atherosclerosis (ICD10-I70.0). Electronically Signed   By: Gilmer Mor D.O.   On: 02/07/2023 11:21    Scheduled Meds:  sodium chloride   Intravenous Once   amoxicillin-clavulanate  1 tablet Oral Q12H   divalproex  750 mg Oral BID   feeding supplement  237 mL Oral BID BM   midodrine  5 mg Oral TID WC   multivitamin with minerals  1 tablet Oral Daily   sodium chloride flush  3 mL Intravenous Q12H   sodium chloride flush  5 mL Intracatheter Q8H   Continuous Infusions:  cefTRIAXone (ROCEPHIN)  IV        LOS: 12 days   Burnadette Pop, MD [facility] P12/08/2022, 1:23 PM

## 2023-02-07 NOTE — Plan of Care (Signed)

## 2023-02-07 NOTE — NC FL2 (Signed)
[place] MEDICAID FL2 LEVEL OF CARE FORM     IDENTIFICATION  Patient Name: Samuel Mayo Birthdate: 08-Aug-1945 Sex: male Admission Date (Current Location): 01/24/2023  [facility] and [place] Number:  Producer, television/film/video and Address:  [facility]. [facility], [address], [place], [place] [address]      Provider Number: [id number]  Attending Physician Name and Address:  Burnadette Pop, MD  Relative Name and Phone Number:  Rakim Zhai; [phone]    Current Level of Care: Hospital Recommended Level of Care: Skilled Nursing Facility Prior Approval Number:    Date Approved/Denied:   PASRR Number: [id number]  Discharge Plan: SNF    Current Diagnoses: Patient Active Problem List   Diagnosis Date Noted   Palliative care by specialist 02/05/2023   Weakness generalized 02/05/2023   Hyperbilirubinemia 02/02/2023   DNR (do not resuscitate) 02/02/2023   DNR (do not resuscitate) discussion 02/02/2023   Malnutrition of moderate degree 01/31/2023   Acute biliary pancreatitis 01/27/2023   Cholecystitis 01/25/2023   Obstructive jaundice 01/24/2023   Acute cholecystitis 12/30/2022   Routine general medical examination at a health care facility 05/07/2022   Seizure (HCC) 04/26/2022   Secondary hypercoagulable state (HCC) 10/29/2021   Atrial fibrillation, chronic (HCC)    Gout, arthritis- R MTP joint. 11/26/2017   Vitamin D deficiency, unspecified 01/30/2017   Osteoporosis 10/23/2016   Hypertension 10/23/2016   Complex partial epilepsy (HCC) 09/21/2015   Status epilepticus (HCC) 09/16/2015    Orientation RESPIRATION BLADDER Height & Weight     Self, Time, Situation, Place  Normal (Room Air) Continent, External catheter Weight: 181 lb 14.1 oz (82.5 kg) Height:  5\' 11"  (180.3 cm)  BEHAVIORAL SYMPTOMS/MOOD NEUROLOGICAL BOWEL NUTRITION STATUS    Convulsions/Seizures (Complex Partial Epilepsy) Continent Diet (Please see dc summary)  AMBULATORY STATUS  COMMUNICATION OF NEEDS Skin   Limited Assist Verbally Normal, Other (Comment) (Biliary Tube Cook slip-coat 10 Fr. RUQ; Biliary Tube Cook slip-coat 12 Fr. RLQ; Biliary Tube Cook slip-coat 10 Fr. LUQ)                       Personal Care Assistance Level of Assistance  Bathing, Feeding, Dressing Bathing Assistance: Limited assistance Feeding assistance: Limited assistance Dressing Assistance: Limited assistance     Functional Limitations Info  Sight Sight Info: Adequate (Sclera yellow (R and L))        SPECIAL CARE FACTORS FREQUENCY  PT (By licensed PT), OT (By licensed OT)     PT Frequency: 5x OT Frequency: 5x            Contractures Contractures Info: Not present    Additional Factors Info  Code Status, Allergies Code Status Info: DNR- Limited Allergies Info: Other; Not Specified (Childhood reaction to Neosporin Ophthalmic ointment caused severe eye redness, irritation)           Current Medications (02/07/2023):  This is the current hospital active medication list Current Facility-Administered Medications  Medication Dose Route Frequency Provider Last Rate Last Admin   0.9 %  sodium chloride infusion (Manually program via Guardrails IV Fluids)   Intravenous Once Burnadette Pop, MD       acetaminophen (TYLENOL) tablet 650 mg  650 mg Oral Q6H PRN Burnadette Pop, MD   650 mg at 02/02/23 1448   cefTRIAXone (ROCEPHIN) 2 g in sodium chloride 0.9 % 100 mL IVPB  2 g Intravenous to XRAY [place], Arman Filter, NP       diclofenac  Sodium (VOLTAREN) 1 % topical gel 2 g  2 g Topical QID PRN Anthoney Harada, NP       divalproex (DEPAKOTE) DR tablet 750 mg  750 mg Oral BID Synetta Fail, MD   750 mg at 02/07/23 1216   feeding supplement (ENSURE ENLIVE / ENSURE PLUS) liquid 237 mL  237 mL Oral BID BM Adhikari, Amrit, MD   237 mL at 02/07/23 1215   HYDROmorphone (DILAUDID) injection 0.5 mg  0.5 mg Intravenous Q4H PRN Burnadette Pop, MD   0.5 mg at 02/07/23 1323   midodrine  (PROAMATINE) tablet 5 mg  5 mg Oral TID WC Burnadette Pop, MD   5 mg at 02/07/23 1216   multivitamin with minerals tablet 1 tablet  1 tablet Oral Daily Burnadette Pop, MD   1 tablet at 02/07/23 1216   piperacillin-tazobactam (ZOSYN) IVPB 3.375 g  3.375 g Intravenous Q8H Silvana Newness, RPH 12.5 mL/hr at 02/07/23 1417 3.375 g at 02/07/23 1417   polyethylene glycol (MIRALAX / GLYCOLAX) packet 17 g  17 g Oral Daily PRN Synetta Fail, MD       sodium chloride flush (NS) 0.9 % injection 3 mL  3 mL Intravenous Q12H Synetta Fail, MD   3 mL at 02/06/23 2138   sodium chloride flush (NS) 0.9 % injection 5 mL  5 mL Intracatheter Q8H Gilmer Mor, DO   5 mL at 02/07/23 1418     Discharge Medications: Please see discharge summary for a list of discharge medications.  Relevant Imaging Results:  Relevant Lab Results:   Additional Information SS# [id number]  Marliss Coots, LCSW

## 2023-02-07 NOTE — TOC Progression Note (Addendum)
Transition of Care [facility]) - Progression Note    Patient Details  Name: Samuel Mayo MRN: [id number] Date of Birth: 01-05-1946  Transition of Care [facility]) CM/SW Contact  Marliss Coots, LCSW Phone Number: 02/07/2023, 1:41 PM  Clinical Narrative:     1:41 PM CSW returned to patient's bedside to provide patient and son with SNF options and Medicare ratings. Patient and son have yet to decide on SNF. Upon departing patient's room, bedside RN relayed to CSW that patient and son requested CSW to follow up with patient's significant other, Nicholos Johns, instead of son, as son resides in [place]. CSW accepted request.  3:16 PM CSW returned to patient's bedside to speak with Nicholos Johns. Caryn Bee was also present. Patient was asleep in bed. Nicholos Johns informed CSW that of currently options ([facility], [employer], [address], [place], [place]) they are most interested in [facility]. CSW stated that referral is still pending at [place], [address], [employer], [employer], [facility], [facility], [employer]. Nicholos Johns expressed preference in [place], [place], and [place] due to distance. CSW offered to follow-up with preferred SNF's, which patient accepted. Nicholos Johns expressed interest in a phone call rather than in person due to lack of psace in patient's room with two visitors. CSW left a Engineer, technical sales for [employer]. CSW contacted [facility], who informed CSW that they do not accepted patient's insurance. CSW contacted [facility].  4:41 PM CSW returned to patient's bedside to update Nicholos Johns on SNF options ([facility] out of network, [facility] is to provide a decision Monday, CSW LVM with [facility]). Nicholos Johns expressed understanding of this information and continued to express interest in [place] and/or [address] over [facility].  Expected Discharge Plan: Skilled Nursing Facility Barriers to Discharge: Continued Medical Work up, Community education officer, SNF Pending bed offer  Expected Discharge Plan and Services In-house Referral: Clinical Social Work Discharge Planning Services: CM Consult   Living arrangements for the past 2 months: Single Family Home                                       Social Determinants of Health (SDOH) Interventions SDOH Screenings   Food Insecurity: No Food Insecurity (01/25/2023)  Housing: Patient Declined (01/25/2023)  Transportation Needs: No Transportation Needs (01/25/2023)  Utilities: Not At Risk (01/25/2023)  Alcohol Screen: Low Risk  (10/21/2022)  Depression (PHQ2-9): High Risk (01/13/2023)  Financial Resource Strain: Low Risk  (10/21/2022)  Physical Activity: Insufficiently Active (10/21/2022)  Social Connections: Moderately Integrated (10/21/2022)  Stress: Stress Concern Present (10/21/2022)  Tobacco Use: Low Risk  (01/31/2023)    Readmission Risk Interventions     No data to display

## 2023-02-08 DIAGNOSIS — K831 Obstruction of bile duct: Secondary | ICD-10-CM | POA: Diagnosis not present

## 2023-02-08 DIAGNOSIS — Z515 Encounter for palliative care: Secondary | ICD-10-CM | POA: Diagnosis not present

## 2023-02-08 LAB — CBC
HCT: 28.3 % — ABNORMAL LOW (ref 39.0–52.0)
Hemoglobin: 9.5 g/dL — ABNORMAL LOW (ref 13.0–17.0)
MCH: 35.2 pg — ABNORMAL HIGH (ref 26.0–34.0)
MCHC: 33.6 g/dL (ref 30.0–36.0)
MCV: 104.8 fL — ABNORMAL HIGH (ref 80.0–100.0)
Platelets: 239 10*3/uL (ref 150–400)
RBC: 2.7 MIL/uL — ABNORMAL LOW (ref 4.22–5.81)
RDW: 16.8 % — ABNORMAL HIGH (ref 11.5–15.5)
WBC: 13.1 10*3/uL — ABNORMAL HIGH (ref 4.0–10.5)
nRBC: 0 % (ref 0.0–0.2)

## 2023-02-08 LAB — COMPREHENSIVE METABOLIC PANEL
ALT: 130 U/L — ABNORMAL HIGH (ref 0–44)
AST: 125 U/L — ABNORMAL HIGH (ref 15–41)
Albumin: 1.7 g/dL — ABNORMAL LOW (ref 3.5–5.0)
Alkaline Phosphatase: 284 U/L — ABNORMAL HIGH (ref 38–126)
Anion gap: 11 (ref 5–15)
BUN: 32 mg/dL — ABNORMAL HIGH (ref 8–23)
CO2: 22 mmol/L (ref 22–32)
Calcium: 8.3 mg/dL — ABNORMAL LOW (ref 8.9–10.3)
Chloride: 97 mmol/L — ABNORMAL LOW (ref 98–111)
Creatinine, Ser: 1.13 mg/dL (ref 0.61–1.24)
GFR, Estimated: >60 mL/min (ref >60)
Glucose, Bld: 160 mg/dL — ABNORMAL HIGH (ref 70–99)
Potassium: 4.6 mmol/L (ref 3.5–5.1)
Sodium: 130 mmol/L — ABNORMAL LOW (ref 135–145)
Total Bilirubin: 17 mg/dL — ABNORMAL HIGH (ref <1.2)
Total Protein: 5.5 g/dL — ABNORMAL LOW (ref 6.5–8.1)

## 2023-02-08 LAB — BLOOD CULTURE ID PANEL (REFLEXED) - BCID2

## 2023-02-08 MED ORDER — APIXABAN 5 MG PO TABS
5.0000 mg | ORAL_TABLET | Freq: Two times a day (BID) | ORAL | Status: DC
  Administered 2023-02-08 – 2023-02-24 (×33): 5 mg via ORAL
  Filled 2023-02-08 (×33): qty 1

## 2023-02-08 MED ORDER — SODIUM CHLORIDE 0.9 % IV BOLUS
500.0000 mL | Freq: Once | INTRAVENOUS | Status: AC
  Administered 2023-02-08: 500 mL via INTRAVENOUS

## 2023-02-08 MED ORDER — MIDODRINE HCL 5 MG PO TABS
10.0000 mg | ORAL_TABLET | Freq: Three times a day (TID) | ORAL | Status: DC
  Administered 2023-02-08 – 2023-02-24 (×48): 10 mg via ORAL
  Filled 2023-02-08 (×49): qty 2

## 2023-02-08 NOTE — Progress Notes (Addendum)
Daily Progress Note   Patient Name: Samuel Mayo       Date: 02/08/2023 DOB: [birth year]  Age: 77 y.o. MRN#: [id number] Attending Physician: Burnadette Pop, Mayo Primary Care Physician: [name], Samuel Mayo Admit Date: 01/24/2023  Reason for Consultation/Follow-up: Establishing goals of care  Length of Stay: 13  Current Medications: Scheduled Meds:   sodium chloride   Intravenous Once   apixaban  5 mg Oral BID   divalproex  750 mg Oral BID   feeding supplement  237 mL Oral BID BM   midodrine  10 mg Oral TID WC   multivitamin with minerals  1 tablet Oral Daily   sodium chloride flush  3 mL Intravenous Q12H   sodium chloride flush  5 mL Intracatheter Q8H    Continuous Infusions:  sodium chloride 75 mL/hr at 02/08/23 0606   piperacillin-tazobactam (ZOSYN)  IV 3.375 g (02/08/23 0608)    PRN Meds: acetaminophen, diclofenac Sodium, HYDROmorphone (DILAUDID) injection, polyethylene glycol  Physical Exam Vitals reviewed.  Constitutional:      Appearance: He is ill-appearing.  HENT:     Head: Normocephalic and atraumatic.  Pulmonary:     Effort: Tachypnea present.  Skin:    Coloration: Skin is jaundiced.  Neurological:     Mental Status: He is alert and oriented to person, place, and time.             Vital Signs: BP (!) 89/60   Pulse 94   Temp 97.6 F (36.4 C) (Oral)   Resp (!) 24   Ht 5\' 11"  (1.803 m)   Wt 82.5 kg   SpO2 97%   BMI 25.37 kg/m  SpO2: SpO2: 97 % O2 Device: O2 Device: Room Air O2 Flow Rate: O2 Flow Rate (L/min): 2 L/min    Patient Active Problem List   Diagnosis Date Noted   Palliative care by specialist 02/05/2023   Weakness generalized 02/05/2023   Hyperbilirubinemia 02/02/2023   DNR (do not resuscitate) 02/02/2023   DNR (do not  resuscitate) discussion 02/02/2023   Malnutrition of moderate degree 01/31/2023   Acute biliary pancreatitis 01/27/2023   Cholecystitis 01/25/2023   Obstructive jaundice 01/24/2023   Acute cholecystitis 12/30/2022   Routine general medical examination at a health care facility 05/07/2022   Seizure (HCC) 04/26/2022   Secondary hypercoagulable state (HCC)  10/29/2021   Atrial fibrillation, chronic (HCC)    Gout, arthritis- R MTP joint. 11/26/2017   Vitamin D deficiency, unspecified 01/30/2017   Osteoporosis 10/23/2016   Hypertension 10/23/2016   Complex partial epilepsy (HCC) 09/21/2015   Status epilepticus (HCC) 09/16/2015    Palliative Care Assessment & Plan   Patient Profile: 77 y.o. male  with past medical history of a-fib / flutter on Eliquis, mitral valve annuloplasty, HTN, and seizure disorder. 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 Underwent percutaneous cholecystomy tube placed. Discharged 01/03/23 on antibiotics. Saw PCP, found to have worsening jaundice. Went for drain study 11/22 and there was non-visualization of CBD so readmitted on 11/22. GI saw on 11/23 for jaundice. MRI/MRCP showed increased mild to moderate intrahepatic bile duct dilation and new mural thickening and enhancement of the extrahepatic bile duct suspicious for cholangitis.   On 12/1,patient became febrile last night, hypotensive, tachycardic. Underlying sepsis suspected. Now clinically improving. Current plan is to continue IV antibiotics, follow-up cultures, allowing improvement in the liver function.   Today's Discussion: Patient was alone in his room. He reports no current discomfort. He shares that he did have discomfort yesterday. We discuss the continued goal to have follow up at [facility]. We discussed the importance of nutrition and staying active.  Saw patient's sons in the hallway and we spoke. They share they are concerned their father is having some  depression as he has been less motivated. They also share the patient has not been eating much. We discussed the importance of both nutrition and staying active for rehabilitation. We discussed food choices that are high protein and high calorie. They share their father likes sweets so I suggested magic cups. They are eager for PT to work with their father and get him up and moving.   Patient and family face ongoing treatment option decisions, advanced directive decisions and anticipatory care needs.   Encouraged family to call PMT with questions or concerns.  Recommendations/Plan: DNR/DNI Treat the treatable Add OT Goal to have outpatient follow up at [facility] at discharge Continued PMT support   Code Status:    Code Status Orders  (From admission, onward)           Start     Ordered   02/02/23 0923  Do not attempt resuscitation (DNR)- Limited -Do Not Intubate (DNI)  (Code Status)  Continuous       Question Answer Comment  If pulseless and not breathing No CPR or chest compressions.   In Pre-Arrest Conditions (Patient Is Breathing and Has A Pulse) Do not intubate. Provide all appropriate non-invasive medical interventions. Avoid ICU transfer unless indicated or required.   Consent: Discussion documented in EHR or advanced directives reviewed      02/02/23 0922         Extensive chart review has been completed prior to seeing the patient including labs, vital signs, imaging, progress/consult notes, orders, medications, and available advance directive documents.  Time spent: 35 minutes  Thank you for allowing the Palliative Medicine Team to assist in the care of this patient.    Sherryll Burger, NP  Please contact Palliative Medicine Team phone at [phone] for questions and concerns.

## 2023-02-08 NOTE — Progress Notes (Signed)
PHARMACY - PHYSICIAN COMMUNICATION CRITICAL VALUE ALERT - BLOOD CULTURE IDENTIFICATION (BCID)  Samuel Mayo is an 77 y.o. male who presented to [facility] on 01/24/2023 with a chief complaint of obstructive jaundice.   Assessment:  Received phone call for 1/2 blood cultures positive for Pseudomonas aeruginosa no resistance genes detected. Improving per physician documentation   Name of physician (or Provider) Contacted: Amrit Adhikari   Current antibiotics: Zosyn 3.375 q8h   Changes to prescribed antibiotics recommended:  Patient is on recommended antibiotics - No changes needed  Results for orders placed or performed during the hospital encounter of 01/24/23  Blood Culture ID Panel (Reflexed) (Collected: 02/07/2023  2:28 PM)  Result Value Ref Range   Enterococcus faecalis NOT DETECTED NOT DETECTED   Enterococcus Faecium NOT DETECTED NOT DETECTED   Listeria monocytogenes NOT DETECTED NOT DETECTED   Staphylococcus species NOT DETECTED NOT DETECTED   Staphylococcus aureus (BCID) NOT DETECTED NOT DETECTED   Staphylococcus epidermidis NOT DETECTED NOT DETECTED   Staphylococcus lugdunensis NOT DETECTED NOT DETECTED   Streptococcus species NOT DETECTED NOT DETECTED   Streptococcus agalactiae NOT DETECTED NOT DETECTED   Streptococcus pneumoniae NOT DETECTED NOT DETECTED   Streptococcus pyogenes NOT DETECTED NOT DETECTED   A.calcoaceticus-baumannii NOT DETECTED NOT DETECTED   Bacteroides fragilis NOT DETECTED NOT DETECTED   Enterobacterales NOT DETECTED NOT DETECTED   Enterobacter cloacae complex NOT DETECTED NOT DETECTED   Escherichia coli NOT DETECTED NOT DETECTED   Klebsiella aerogenes NOT DETECTED NOT DETECTED   Klebsiella oxytoca NOT DETECTED NOT DETECTED   Klebsiella pneumoniae NOT DETECTED NOT DETECTED   Proteus species NOT DETECTED NOT DETECTED   Salmonella species NOT DETECTED NOT DETECTED   Serratia marcescens NOT DETECTED NOT DETECTED   Haemophilus influenzae NOT  DETECTED NOT DETECTED   Neisseria meningitidis NOT DETECTED NOT DETECTED   Pseudomonas aeruginosa DETECTED (A) NOT DETECTED   Stenotrophomonas maltophilia NOT DETECTED NOT DETECTED   Candida albicans NOT DETECTED NOT DETECTED   Candida auris NOT DETECTED NOT DETECTED   Candida glabrata NOT DETECTED NOT DETECTED   Candida krusei NOT DETECTED NOT DETECTED   Candida parapsilosis NOT DETECTED NOT DETECTED   Candida tropicalis NOT DETECTED NOT DETECTED   Cryptococcus neoformans/gattii NOT DETECTED NOT DETECTED   CTX-M ESBL NOT DETECTED NOT DETECTED   Carbapenem resistance IMP NOT DETECTED NOT DETECTED   Carbapenem resistance KPC NOT DETECTED NOT DETECTED   Carbapenem resistance NDM NOT DETECTED NOT DETECTED   Carbapenem resistance VIM NOT DETECTED NOT DETECTED    Cedric Fishman 02/08/2023  1:59 PM

## 2023-02-08 NOTE — Progress Notes (Signed)
Mobility Specialist Progress Note    02/08/23 1117  Mobility  Activity Ambulated with assistance in hallway  Level of Assistance Minimal assist, patient does 75% or more  Assistive Device Front wheel walker  Distance Ambulated (ft) 90 ft  Activity Response Tolerated well  Mobility Referral Yes  Mobility visit 1 Mobility  Mobility Specialist Start Time (ACUTE ONLY) 1055  Mobility Specialist Stop Time (ACUTE ONLY) 1116  Mobility Specialist Time Calculation (min) (ACUTE ONLY) 21 min   Post-Mobility: 107 HR  Pt received in bathroom and agreeable. No complaints. Washed hands and brushed teeth upon return to room. Left in bed with call bell in reach and son present.   [place] Mobility Specialist  Please Neurosurgeon or Rehab Office at [phone]

## 2023-02-08 NOTE — Progress Notes (Signed)
PROGRESS NOTE  Samuel Mayo  NFA:[id number] DOB: 09-23-1945 DOA: 01/24/2023 PCP: [name], [name], MD   Brief Narrative: Patient is a 77 year old male with history of hypertension, atrial fibrillation, seizures, gout, MVR s/p angioplasty who presented with worsening jaundice.  Patient was recently admitted from 10/28 -11/1 for cholecystitis, diagnosed with Mirizzi syndrome ,underwent cholecystostomy.  Follow-up imaging showed persistent occlusion of the cystic duct, elevated liver enzymes so recommended to go to the emergency department.  Patient also  reported decreased appetite, weakness.  On presentation, he was hemodynamically stable.  Lab work showed sodium of 127, lipase of 1228, elevated liver enzymes, bilirubin of 20.  General surgery, GI consulted.  Attempted  ERCP but turned out to be unsuccessful.  IR did PTC with biliary drain placement on 11/29.  On 12/1,patient became febrile last night, hypotensive, tachycardic.  Underlying sepsis suspected.  Critical care , palliaitive care consulted.  Now clinically improving.  IR placed  left-sided biliary drain on 12/6. Plan for SnF  Assessment & Plan:  Principal Problem:   Obstructive jaundice Active Problems:   Atrial fibrillation, chronic (HCC)   Hypertension   Seizure (HCC)   Cholecystitis   Acute biliary pancreatitis   Malnutrition of moderate degree   Hyperbilirubinemia   DNR (do not resuscitate)   DNR (do not resuscitate) discussion   Palliative care by specialist   Weakness generalized   Obstructive jaundice: Initially admitted on 10/28 when he presented with jaundice.  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  Underwent percutaneous cholecystomy tube placement on 10/31 and was discharged home with oral antibiotics.  Outpatient labs showed persistently elevated liver enzymes. Drain study on 11/22 by IR showed persistent occlusion of cystic duct and no opacification of CBD.  MRCP showed  dilated intrahepatic bile duct with thickening of the extrahepatic bile duct, suspicious for cholangitis.  Continues to have severe elevated bilirubin, alkaline phosphatase, hyponatremia.  General surgery, GI consulted.  Currently on Unasyn. Denies abdomen pain, nausea or vomiting today.  Remains intensely jaundiced. Attempted  ERCP on 11/27  but turned out to be unsuccessful.   IR did PTC with biliary drain placement on 11/29.  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    He will follow-up with Dr. Allen(hepatobiliary surgery) as an  outpatient at [facility].    Bilirubin  plateaued in the range of 14.IR placed left-sided biliary drain on 12/6.  Follow-up cytology sent from biliary brushings.  Sepsis: Became febrile on the night of 11/30, developed leukocytosis.   CT abdomen/pelvis ordered without finding of any acute problem.  Culture ordered, no growth till date.   No leukocytosis.  Antibiotics changed to Augmentin but after the left-sided biliary drain placement on 12/6 , he developed rigors,fever, developed leukocytosis.  Ordered  blood cultures again, restarted Zosyn.  If he becomes afebrile for next 24 to 48 hours, can change antibiotics to Augmentin, plan for total course of 14 days.  Hyponatremia: Baseline sodium runs low in the range of 120-132.  Sodium dropped to the range of 122, prompting nephrology consult.  Started on urea, Lasix now stopped.  Likely SIADH.  Sodium is stable in the range of 120s.  Lasix discontinued  AKI: Likely hemodynamically mediated, patient was hypotensive .  Now resolved  Hypertension: On amlodipine, Toprol at home.  Currently on hold.  Continue midodrine  Paroxysmal A-fib: He was On metoprolol which is on hold because of soft blood pressure. Eliquis was on hold .  Will  resume  Remains in normal sinus rhythm at present.  Seizure disorder: On Depakote.  Elevated liver enzymes is not from Depakote but from obstructive liver disease.  History of  mitral valve regurgitation: Status post annuloplasty.  Follow-up with cardiology as an outpatient  Goals of care: Palliative care, critical care consulted.  Patient might have underlying biliary tumor which looks like unresectable as per general surgery.  Poor prognosis.  CODE STATUS changed to DNR.  Might be a candidate for hospice/comfort care if further declines.    Deconditioning/debility: PT/OT recommending home health.  Wife requesting for a SNF.  Now the plan  is for SNF, TOC following   Nutrition Problem: Severe Malnutrition Etiology: acute illness    DVT prophylaxis:SCDs Start: 01/24/23 1337     Code Status: Limited: Do not attempt resuscitation (DNR) -DNR-LIMITED -Do Not Intubate/DNI   Family Communication: Son at  bedside on 12/7  Patient status:Inpatient  Patient is from :Home  Anticipated discharge to:  SnF  Estimated DC date: 2-3 days    Consultants: GI, general surgery, PCCM, palliative care  Procedures: ERCP, PTC  Antimicrobials:  Anti-infectives (From admission, onward)    Start     Dose/Rate Route Frequency Ordered Stop   02/07/23 1445  piperacillin-tazobactam (ZOSYN) IVPB 3.375 g        3.375 g 12.5 mL/hr over 240 Minutes Intravenous Every 8 hours 02/07/23 1347     02/07/23 1002  cefTRIAXone (ROCEPHIN) 2 g in sodium chloride 0.9 % 100 mL IVPB        over 30 Minutes Intravenous Continuous PRN 02/07/23 1011 02/07/23 1017   02/07/23 0945  cefTRIAXone (ROCEPHIN) 2 g in sodium chloride 0.9 % 100 mL IVPB        2 g 200 mL/hr over 30 Minutes Intravenous To Radiology 02/07/23 0845 02/08/23 0945   02/06/23 1000  amoxicillin-clavulanate (AUGMENTIN) 875-125 MG per tablet 1 tablet  Status:  Discontinued        1 tablet Oral Every 12 hours 02/05/23 1108 02/07/23 1347   02/05/23 1000  Vancomycin (VANCOCIN) 1,500 mg in sodium chloride 0.9 % 500 mL IVPB        1,500 mg 250 mL/hr over 120 Minutes Intravenous Every 24 hours 02/05/23 0903 02/05/23 1229   02/04/23 1000   vancomycin (VANCOCIN) IVPB 1000 mg/200 mL premix  Status:  Discontinued        1,000 mg 200 mL/hr over 60 Minutes Intravenous Every 24 hours 02/03/23 1122 02/05/23 0903   02/03/23 1000  Vancomycin (VANCOCIN) 1,500 mg in sodium chloride 0.9 % 500 mL IVPB  Status:  Discontinued        1,500 mg 250 mL/hr over 120 Minutes Intravenous Every 24 hours 02/02/23 1006 02/03/23 1122   02/02/23 2100  vancomycin (VANCOCIN) IVPB 1000 mg/200 mL premix  Status:  Discontinued        1,000 mg 200 mL/hr over 60 Minutes Intravenous Every 12 hours 02/02/23 0811 02/02/23 1006   02/02/23 1100  vancomycin (VANCOCIN) IVPB 1000 mg/200 mL premix       Placed in "Followed by" Linked Group   1,000 mg 200 mL/hr over 60 Minutes Intravenous  Once 02/02/23 0913 02/02/23 1500   02/02/23 1000  vancomycin (VANCOCIN) IVPB 1000 mg/200 mL premix       Placed in "Followed by" Linked Group   1,000 mg 200 mL/hr over 60 Minutes Intravenous  Once 02/02/23 0913 02/02/23 1340   02/02/23 0900  vancomycin (VANCOREADY) IVPB 2000 mg/400 mL  Status:  Discontinued        2,000 mg 200 mL/hr over 120 Minutes Intravenous  Once 02/02/23 0804 02/02/23 0913   02/02/23 0900  piperacillin-tazobactam (ZOSYN) IVPB 3.375 g        3.375 g 12.5 mL/hr over 240 Minutes Intravenous Every 8 hours 02/02/23 0805 02/05/23 2359   02/01/23 2200  amoxicillin-clavulanate (AUGMENTIN) 875-125 MG per tablet 1 tablet  Status:  Discontinued        1 tablet Oral Every 12 hours 02/01/23 1150 02/02/23 0738   01/31/23 1058  cefOXitin (MEFOXIN) 2 g in sodium chloride 0.9 % 100 mL IVPB        over 30 Minutes  Continuous PRN 01/31/23 1058 01/31/23 1058   01/31/23 0000  cefOXitin (MEFOXIN) 2 g in sodium chloride 0.9 % 100 mL IVPB  Status:  Discontinued        2 g 200 mL/hr over 30 Minutes Intravenous To Radiology 01/30/23 1014 01/30/23 1015   01/25/23 1600  ampicillin-sulbactam (UNASYN) 1.5 g in sodium chloride 0.9 % 100 mL IVPB  Status:  Discontinued        1.5 g 200  mL/hr over 30 Minutes Intravenous Every 6 hours 01/25/23 1525 02/01/23 1150       Subjective: Patient seen and examined at bedside today.  After the biliary drain placement on 12/6, he again became febrile and hypotensive, developed rigors.  This morning he looked comfortable.  Blood pressure is still soft but tachycardia has resolved.  He is afebrile this morning.  Looks overall comfortable, sitting in the chair.  Denies any abdomen pain, nausea or vomiting.  Objective: Vitals:   02/08/23 0804 02/08/23 0822 02/08/23 1015 02/08/23 1124  BP: (!) 89/55 97/67 103/72 99/65  Pulse: 89 88 98   Resp: 16 19 (!) 24   Temp: 98 F (36.7 C) 97.7 F (36.5 C)  97.6 F (36.4 C)  TempSrc: Oral Oral  Oral  SpO2: 97% 97% 98%   Weight:      Height:        Intake/Output Summary (Last 24 hours) at 02/08/2023 1145 Last data filed at 02/08/2023 0904 Gross per 24 hour  Intake 418 ml  Output 1987 ml  Net -1569 ml   Filed Weights   01/24/23 0953 02/05/23 0223  Weight: 86.2 kg 82.5 kg    Examination:  General exam: Overall comfortable, not in distress, weak and deconditioned HEENT: PERRL Respiratory system:  no wheezes or crackles  Cardiovascular system: S1 & S2 heard, RRR.  Gastrointestinal system: Abdomen is nondistended, soft and nontender.  2 drains on the right side, 1 drain on the middle/left side of the abdomen Central nervous system: Alert and oriented Extremities: No edema, no clubbing ,no cyanosis Skin: jaundice   Data Reviewed: I have personally reviewed following labs and imaging studies  CBC: Recent Labs  Lab 02/02/23 0801 02/02/23 1117 02/03/23 0208 02/07/23 0204 02/08/23 0158  WBC 12.7* 9.6 6.8 7.2 13.1*  HGB 6.8* 10.9* 9.1* 9.3* 9.5*  HCT 19.7* 31.7* 26.6* 28.4* 28.3*  MCV 98.5 100.3* 99.3 103.6* 104.8*  PLT 250 220 183 241 239   Basic Metabolic Panel: Recent Labs  Lab 02/04/23 0216 02/05/23 0205 02/06/23 0203 02/07/23 0204 02/08/23 0158  NA 129* 129* 129*  130* 130*  K 3.8 3.7 3.9 3.9 4.6  CL 96* 96* 94* 95* 97*  CO2 23 24 23 23 22   GLUCOSE 102* 101* 109* 142* 160*  BUN 70* 40* 37* 30* 32*  CREATININE 1.66* 1.13 0.95  0.92 1.13  CALCIUM 8.5* 8.5* 8.7* 8.9 8.3*     Recent Results (from the past 240 hour(s))  Culture, blood (Routine X 2) w Reflex to ID Panel     Status: None   Collection Time: 02/02/23  8:03 AM   Specimen: BLOOD LEFT ARM  Result Value Ref Range Status   Specimen Description BLOOD LEFT ARM  Final   Special Requests   Final    BOTTLES DRAWN AEROBIC AND ANAEROBIC Blood Culture results may not be optimal due to an inadequate volume of blood received in culture bottles   Culture   Final    NO GROWTH 5 DAYS Performed at [facility], [address] [place], [place] [address]    Report Status 02/07/2023 FINAL  Final  Culture, blood (Routine X 2) w Reflex to ID Panel     Status: None   Collection Time: 02/02/23  8:04 AM   Specimen: BLOOD LEFT ARM  Result Value Ref Range Status   Specimen Description BLOOD LEFT ARM  Final   Special Requests   Final    BOTTLES DRAWN AEROBIC AND ANAEROBIC Blood Culture results may not be optimal due to an inadequate volume of blood received in culture bottles   Culture   Final    NO GROWTH 5 DAYS Performed at [facility], [address] [place], [place] [address]    Report Status 02/07/2023 FINAL  Final  Culture, blood (Routine X 2) w Reflex to ID Panel     Status: None (Preliminary result)   Collection Time: 02/07/23  2:28 PM   Specimen: BLOOD  Result Value Ref Range Status   Specimen Description BLOOD SITE NOT SPECIFIED  Final   Special Requests   Final    BOTTLES DRAWN AEROBIC ONLY Blood Culture adequate volume   Culture   Final    NO GROWTH < 24 HOURS Performed at [facility], [address], [place], [place] [address]    Report Status PENDING  Incomplete  Culture, blood (Routine X 2) w Reflex to ID Panel     Status: None (Preliminary result)   Collection  Time: 02/07/23  2:30 PM   Specimen: BLOOD  Result Value Ref Range Status   Specimen Description BLOOD SITE NOT SPECIFIED  Final   Special Requests   Final    BOTTLES DRAWN AEROBIC ONLY Blood Culture adequate volume   Culture   Final    NO GROWTH < 24 HOURS Performed at [facility], [address], [place], [place] [address]    Report Status PENDING  Incomplete     Radiology Studies: IR INT EXT BILIARY DRAIN WITH CHOLANGIOGRAM  Result Date: 02/07/2023 INDICATION: 77 year old gentleman with history of biliary obstruction underwent percutaneous cholecystostomy placement on 01/02/2023 and replacement on 01/24/2023. Continued symptoms of jaundice and hyperbilirubinemia prompteda MRI on 01/24/2023. Enhancement of the ductal system was suspicious for cholangitis. Right hepatic biliary drain was placed on 01/31/2023. Imaging from [facility] on 01/31/2023 was suspicious for Klatskin tumor with irregular narrowing of central right and left main hepatic ducts. Despite placement of the right hepatic drain, hyperbilirubinemia and jaundice has persisted. He returns today for placement of left hepatic biliary drain as well as brush biopsy of the stenosed segment of the right hepatic duct through a pre-existing access. EXAM: 1. Ultrasound and fluoroscopy guided left hepatic internal external biliary drain placement 2. Fluoroscopy guided exchange of existing right hepatic internal external biliary drain 3. Brush biopsy  of common and right hepatic bile ducts 4. Bilateral percutaneous transhepatic cholangiogram MEDICATIONS: Rocephin 2 g IV; The antibiotic was administered within an appropriate time frame prior to the initiation of the procedure. Demerol IV 50 mg ANESTHESIA/SEDATION: Moderate (conscious) sedation was employed during this procedure. A total of Versed 3 mg and Fentanyl 75 mcg was administered intravenously by the radiology nurse. Total intra-service moderate Sedation Time: 56 minutes. The patient's level  of consciousness and vital signs were monitored continuously by radiology nursing throughout the procedure under my direct supervision. FLUOROSCOPY: Radiation Exposure Index (as provided by the fluoroscopic device): 98 mGy Kerma COMPLICATIONS: None immediate. PROCEDURE: Informed written consent was obtained from the patient after a thorough discussion of the procedural risks, benefits and alternatives. All questions were addressed. Maximal Sterile Barrier Technique was utilized including caps, mask, sterile gowns, sterile gloves, sterile drape, hand hygiene and skin antiseptic. A timeout was performed prior to the initiation of the procedure. Patient positioned supine on the procedure table. Epigastric and right upper quadrant abdominal wall skin prepped and draped in the usual sterile fashion. Sonographic evaluation of the left hepatic lobe showed mildly dilated ducts. Following local lidocaine administration, a left hepatic lobe duct was successfully accessed with a 21 gauge needle. 21 gauge needle was removed over 0.018 inch guidewire. The guidewire was advanced to the level of the common bile duct. Contrast administered through the right hepatic drain confirmed appropriate positioning within the right hepatic bile ducts and doudenum. The right-sided 57 French drain was cut and removed over 0.035 inch guidewire and replaced with an 8 [place] sheath. The 8 French sheath was advanced beyond the site of right hepatic and common bile duct stenosis. Two brush biopsies were performed at the stenotic segments of the common and right main hepatic ducts. Both samples were sent to pathology in sterile saline. New 12 French internal external biliary drain was inserted over the 0.035 inch guidewire. The pigtail was formed within the doudenum. Attention was again turned to the left hepatic lobe. Transitional dilator set was inserted over the 0.018 inch guidewire. Kumpe catheter and stiff glidewire were advanced to the level of  the doudenum utilizing fluoroscopic guidance through this access. Kumpe the catheter was removed and a 10 [place] internal external biliary drain was inserted. The distal pigtail was formed within the doudenum. Contrast administered through both drains confirmed appropriate positioning within the doudenum as well as within the intrahepatic bile ducts. Both drains were secured to skin with silk suture and connected to bag. IMPRESSION: 1. Successful brush biopsy of the stenotic segments of the common bile and right main bile ducts. 2. Successful replacement of 12 French right hepatic internal external biliary drain. 3. Successful insertion of 10 French left hepatic internal external biliary drain. Electronically Signed   By: Acquanetta Belling M.D.   On: 02/07/2023 16:36   IR ENDOLUMINAL BX OF BILIARY TREE  Result Date: 02/07/2023 INDICATION: 77 year old gentleman with history of biliary obstruction underwent percutaneous cholecystostomy placement on 01/02/2023 and replacement on 01/24/2023. Continued symptoms of jaundice and hyperbilirubinemia prompteda MRI on 01/24/2023. Enhancement of the ductal system was suspicious for cholangitis. Right hepatic biliary drain was placed on 01/31/2023. [facility] on 01/31/2023 was suspicious for Klatskin tumor with irregular narrowing of central right and left main hepatic ducts. Despite placement of the right hepatic drain, hyperbilirubinemia and jaundice has persisted. He returns today for placement of left hepatic biliary drain as well as brush biopsy of the stenosed segment of the right hepatic  duct through a pre-existing access. EXAM: 1. Ultrasound and fluoroscopy guided left hepatic internal external biliary drain placement 2. Fluoroscopy guided exchange of existing right hepatic internal external biliary drain 3. Brush biopsy of common and right hepatic bile ducts 4. Bilateral percutaneous transhepatic cholangiogram MEDICATIONS: Rocephin 2 g IV; The antibiotic was  administered within an appropriate time frame prior to the initiation of the procedure. Demerol IV 50 mg ANESTHESIA/SEDATION: Moderate (conscious) sedation was employed during this procedure. A total of Versed 3 mg and Fentanyl 75 mcg was administered intravenously by the radiology nurse. Total intra-service moderate Sedation Time: 56 minutes. The patient's level of consciousness and vital signs were monitored continuously by radiology nursing throughout the procedure under my direct supervision. FLUOROSCOPY: Radiation Exposure Index (as provided by the fluoroscopic device): 98 mGy Kerma COMPLICATIONS: None immediate. PROCEDURE: Informed written consent was obtained from the patient after a thorough discussion of the procedural risks, benefits and alternatives. All questions were addressed. Maximal Sterile Barrier Technique was utilized including caps, mask, sterile gowns, sterile gloves, sterile drape, hand hygiene and skin antiseptic. A timeout was performed prior to the initiation of the procedure. Patient positioned supine on the procedure table. Epigastric and right upper quadrant abdominal wall skin prepped and draped in the usual sterile fashion. Sonographic evaluation of the left hepatic lobe showed mildly dilated ducts. Following local lidocaine administration, a left hepatic lobe duct was successfully accessed with a 21 gauge needle. 21 gauge needle was removed over 0.018 inch guidewire. The guidewire was advanced to the level of the common bile duct. Contrast administered through the right hepatic drain confirmed appropriate positioning within the right hepatic bile ducts and doudenum. The right-sided 68 French drain was cut and removed over 0.035 inch guidewire and replaced with an 8 [place] sheath. The 8 French sheath was advanced beyond the site of right hepatic and common bile duct stenosis. Two brush biopsies were performed at the stenotic segments of the common and right main hepatic ducts. Both  samples were sent to pathology in sterile saline. New 12 French internal external biliary drain was inserted over the 0.035 inch guidewire. The pigtail was formed within the doudenum. Attention was again turned to the left hepatic lobe. Transitional dilator set was inserted over the 0.018 inch guidewire. Kumpe catheter and stiff glidewire were advanced to the level of the doudenum utilizing fluoroscopic guidance through this access. Kumpe the catheter was removed and a 10 [place] internal external biliary drain was inserted. The distal pigtail was formed within the doudenum. Contrast administered through both drains confirmed appropriate positioning within the doudenum as well as within the intrahepatic bile ducts. Both drains were secured to skin with silk suture and connected to bag. IMPRESSION: 1. Successful brush biopsy of the stenotic segments of the common bile and right main bile ducts. 2. Successful replacement of 12 French right hepatic internal external biliary drain. 3. Successful insertion of 10 French left hepatic internal external biliary drain. Electronically Signed   By: Acquanetta Belling M.D.   On: 02/07/2023 16:36   IR ENDOLUMINAL BX OF BILIARY TREE  Result Date: 02/07/2023 INDICATION: 77 year old gentleman with history of biliary obstruction underwent percutaneous cholecystostomy placement on 01/02/2023 and replacement on 01/24/2023. Continued symptoms of jaundice and hyperbilirubinemia prompteda MRI on 01/24/2023. Enhancement of the ductal system was suspicious for cholangitis. Right hepatic biliary drain was placed on 01/31/2023. Imaging from [facility] on 01/31/2023 was suspicious for Klatskin tumor with irregular narrowing of central right and left main hepatic ducts. Despite placement  of the right hepatic drain, hyperbilirubinemia and jaundice has persisted. He returns today for placement of left hepatic biliary drain as well as brush biopsy of the stenosed segment of the right hepatic duct through a  pre-existing access. EXAM: 1. Ultrasound and fluoroscopy guided left hepatic internal external biliary drain placement 2. Fluoroscopy guided exchange of existing right hepatic internal external biliary drain 3. Brush biopsy of common and right hepatic bile ducts 4. Bilateral percutaneous transhepatic cholangiogram MEDICATIONS: Rocephin 2 g IV; The antibiotic was administered within an appropriate time frame prior to the initiation of the procedure. Demerol IV 50 mg ANESTHESIA/SEDATION: Moderate (conscious) sedation was employed during this procedure. A total of Versed 3 mg and Fentanyl 75 mcg was administered intravenously by the radiology nurse. Total intra-service moderate Sedation Time: 56 minutes. The patient's level of consciousness and vital signs were monitored continuously by radiology nursing throughout the procedure under my direct supervision. FLUOROSCOPY: Radiation Exposure Index (as provided by the fluoroscopic device): 98 mGy Kerma COMPLICATIONS: None immediate. PROCEDURE: Informed written consent was obtained from the patient after a thorough discussion of the procedural risks, benefits and alternatives. All questions were addressed. Maximal Sterile Barrier Technique was utilized including caps, mask, sterile gowns, sterile gloves, sterile drape, hand hygiene and skin antiseptic. A timeout was performed prior to the initiation of the procedure. Patient positioned supine on the procedure table. Epigastric and right upper quadrant abdominal wall skin prepped and draped in the usual sterile fashion. Sonographic evaluation of the left hepatic lobe showed mildly dilated ducts. Following local lidocaine administration, a left hepatic lobe duct was successfully accessed with a 21 gauge needle. 21 gauge needle was removed over 0.018 inch guidewire. The guidewire was advanced to the level of the common bile duct. Contrast administered through the right hepatic drain confirmed appropriate positioning within the  right hepatic bile ducts and doudenum. The right-sided 27 French drain was cut and removed over 0.035 inch guidewire and replaced with an 8 [place] sheath. The 8 French sheath was advanced beyond the site of right hepatic and common bile duct stenosis. Two brush biopsies were performed at the stenotic segments of the common and right main hepatic ducts. Both samples were sent to pathology in sterile saline. New 12 French internal external biliary drain was inserted over the 0.035 inch guidewire. The pigtail was formed within the doudenum. Attention was again turned to the left hepatic lobe. Transitional dilator set was inserted over the 0.018 inch guidewire. Kumpe catheter and stiff glidewire were advanced to the level of the doudenum utilizing fluoroscopic guidance through this access. Kumpe the catheter was removed and a 10 [place] internal external biliary drain was inserted. The distal pigtail was formed within the doudenum. Contrast administered through both drains confirmed appropriate positioning within the doudenum as well as within the intrahepatic bile ducts. Both drains were secured to skin with silk suture and connected to bag. IMPRESSION: 1. Successful brush biopsy of the stenotic segments of the common bile and right main bile ducts. 2. Successful replacement of 12 French right hepatic internal external biliary drain. 3. Successful insertion of 10 French left hepatic internal external biliary drain. Electronically Signed   By: Acquanetta Belling M.D.   On: 02/07/2023 16:36   IR EXCHANGE BILIARY DRAIN  Result Date: 02/07/2023 INDICATION: 77 year old gentleman with history of biliary obstruction underwent percutaneous cholecystostomy placement on 01/02/2023 and replacement on 01/24/2023. Continued symptoms of jaundice and hyperbilirubinemia prompteda MRI on 01/24/2023. Enhancement of the ductal system was suspicious for cholangitis.  Right hepatic biliary drain was placed on 01/31/2023. Imaging from [facility] on  01/31/2023 was suspicious for Klatskin tumor with irregular narrowing of central right and left main hepatic ducts. Despite placement of the right hepatic drain, hyperbilirubinemia and jaundice has persisted. He returns today for placement of left hepatic biliary drain as well as brush biopsy of the stenosed segment of the right hepatic duct through a pre-existing access. EXAM: 1. Ultrasound and fluoroscopy guided left hepatic internal external biliary drain placement 2. Fluoroscopy guided exchange of existing right hepatic internal external biliary drain 3. Brush biopsy of common and right hepatic bile ducts 4. Bilateral percutaneous transhepatic cholangiogram MEDICATIONS: Rocephin 2 g IV; The antibiotic was administered within an appropriate time frame prior to the initiation of the procedure. Demerol IV 50 mg ANESTHESIA/SEDATION: Moderate (conscious) sedation was employed during this procedure. A total of Versed 3 mg and Fentanyl 75 mcg was administered intravenously by the radiology nurse. Total intra-service moderate Sedation Time: 56 minutes. The patient's level of consciousness and vital signs were monitored continuously by radiology nursing throughout the procedure under my direct supervision. FLUOROSCOPY: Radiation Exposure Index (as provided by the fluoroscopic device): 98 mGy Kerma COMPLICATIONS: None immediate. PROCEDURE: Informed written consent was obtained from the patient after a thorough discussion of the procedural risks, benefits and alternatives. All questions were addressed. Maximal Sterile Barrier Technique was utilized including caps, mask, sterile gowns, sterile gloves, sterile drape, hand hygiene and skin antiseptic. A timeout was performed prior to the initiation of the procedure. Patient positioned supine on the procedure table. Epigastric and right upper quadrant abdominal wall skin prepped and draped in the usual sterile fashion. Sonographic evaluation of the left hepatic lobe showed  mildly dilated ducts. Following local lidocaine administration, a left hepatic lobe duct was successfully accessed with a 21 gauge needle. 21 gauge needle was removed over 0.018 inch guidewire. The guidewire was advanced to the level of the common bile duct. Contrast administered through the right hepatic drain confirmed appropriate positioning within the right hepatic bile ducts and doudenum. The right-sided 61 French drain was cut and removed over 0.035 inch guidewire and replaced with an 8 [place] sheath. The 8 French sheath was advanced beyond the site of right hepatic and common bile duct stenosis. Two brush biopsies were performed at the stenotic segments of the common and right main hepatic ducts. Both samples were sent to pathology in sterile saline. New 12 French internal external biliary drain was inserted over the 0.035 inch guidewire. The pigtail was formed within the doudenum. Attention was again turned to the left hepatic lobe. Transitional dilator set was inserted over the 0.018 inch guidewire. Kumpe catheter and stiff glidewire were advanced to the level of the doudenum utilizing fluoroscopic guidance through this access. Kumpe the catheter was removed and a 10 [place] internal external biliary drain was inserted. The distal pigtail was formed within the doudenum. Contrast administered through both drains confirmed appropriate positioning within the doudenum as well as within the intrahepatic bile ducts. Both drains were secured to skin with silk suture and connected to bag. IMPRESSION: 1. Successful brush biopsy of the stenotic segments of the common bile and right main bile ducts. 2. Successful replacement of 12 French right hepatic internal external biliary drain. 3. Successful insertion of 10 French left hepatic internal external biliary drain. Electronically Signed   By: Acquanetta Belling M.D.   On: 02/07/2023 16:36   CT ABDOMEN W CONTRAST  Result Date: 02/07/2023 CLINICAL DATA:  77 year old male  with a history of biliary obstruction. The original ultrasound 12/26/2022 demonstrates evidence of acute cholecystitis, which was present on a follow-up MRI 12/29/2022. Following this a percutaneous cholecystostomy was placed 01/02/2023 and replaced 01/24/2023. Ongoing symptoms of jaundice and hyperbilirubinemia prompted a second MRI 01/24/2023. MR suggested cholangitis given some enhancement along the ductal system, undrained intrahepatic bile ducts. Internal external biliary drain was placed 01/31/2023. 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 A follow-up CT 01/31/2023 and 02/02/2023 performed, with no radiopaque gallstones accounting for biliary obstruction. The patient has had ongoing hyperbilirubinemia which has plateaued at the level of the proximally 14 and remains symptomatic. He presents for this CT study as a planning study preoperatively 4 left-sided biliary drainage. EXAM: CT ABDOMEN WITH CONTRAST TECHNIQUE: Multidetector CT imaging of the abdomen was performed using the standard protocol following bolus administration of intravenous contrast. RADIATION DOSE REDUCTION: This exam was performed according to the departmental dose-optimization program which includes automated exposure control, adjustment of the mA and/or kV according to patient size and/or use of iterative reconstruction technique. CONTRAST:  75mL OMNIPAQUE IOHEXOL 350 MG/ML SOLN COMPARISON:  Ultrasound 12/26/2022, MR 12/29/2022, per coli 01/02/2023, MR 01/24/2023, CT 01/31/2023, CT 02/02/2023 PTC and drainage 01/31/2023 FINDINGS: Lower chest: No acute finding of the lower chest. Minimal atelectasis and trace fluid. Hepatobiliary: Left-sided intrahepatic biliary ductal dilatation. There is associated periportal edema. There are a few small mildly dilated intrahepatic ducts of the right liver,  particularly of the segment 8. Percutaneous transhepatic internal/external biliary drain via right-sided approach remains adequately position with the radiopaque marker in the bile ducts and the term in a shin of the drain in the small bowel. Percutaneous cholecystostomy tube in place within the fundus of the gallbladder. No radiopaque stones. There are no radiopaque stones identified along the course of the internal/external biliary drain. Region of hypodensity/hypoenhancement at the confluence of the left and right biliary ducts, at the liver hilum on image 19 of series 3. This region measures 13 mm x 42 mm on the axial images. No cirrhotic changes Pancreas: Attenuation/enhancement of the pancreas within normal limits. Mild ductal dilatation of the pancreatic duct. Spleen: Unremarkable Adrenals/Urinary Tract: - Right adrenal gland:  Unremarkable - Left adrenal gland: Unremarkable. - Right kidney: No hydronephrosis, nephrolithiasis, inflammation, or ureteral dilation. No focal lesion. - Left Kidney: No hydronephrosis, nephrolithiasis, inflammation, or ureteral dilation. No focal lesion. Stomach/Bowel: - Stomach: Small hiatal hernia.  Otherwise unremarkable stomach. 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 Internal/external biliary drain terminates in the duodenum. Visualized small bowel and colon unremarkable with no distension. Vascular/Lymphatic: Calcifications of the abdominal aorta. Mesenteric arteries and renal arteries patent. Other: None Musculoskeletal: Unchanged configuration of the visualized vertebral bodies including compression fracture of L1 favored to be chronic. No bony canal narrowing. IMPRESSION: Right-sided internal/external biliary drain in adequate position across the right-sided biliary ducts and into the duodenum. The majority of the right-sided ductal system is decompressed, with some mild residual ductal dilatation involving segment 8. Persistent  left-sided intrahepatic biliary ductal dilatation. Nonspecific focus of low attenuation/hypoenhancement at the confluence of the left and right-sided biliary ducts in the liver hilum. This may reflect focal edema, however, a Klatskin tumor cannot be excluded as etiology for the ductal dilatation. Percutaneous cholecystostomy in position in the gallbladder. Aortic Atherosclerosis (ICD10-I70.0). Electronically Signed   By: Gilmer Mor D.O.  On: 02/07/2023 11:21    Scheduled Meds:  sodium chloride   Intravenous Once   divalproex  750 mg Oral BID   feeding supplement  237 mL Oral BID BM   midodrine  10 mg Oral TID WC   multivitamin with minerals  1 tablet Oral Daily   sodium chloride flush  3 mL Intravenous Q12H   sodium chloride flush  5 mL Intracatheter Q8H   Continuous Infusions:  sodium chloride 75 mL/hr at 02/08/23 0606   piperacillin-tazobactam (ZOSYN)  IV 3.375 g (02/08/23 1610)      LOS: 13 days   Burnadette Pop, MD [facility] P12/09/2022, 11:45 AM

## 2023-02-09 DIAGNOSIS — I482 Chronic atrial fibrillation, unspecified: Secondary | ICD-10-CM | POA: Diagnosis not present

## 2023-02-09 DIAGNOSIS — K831 Obstruction of bile duct: Secondary | ICD-10-CM | POA: Diagnosis not present

## 2023-02-09 DIAGNOSIS — Z66 Do not resuscitate: Secondary | ICD-10-CM

## 2023-02-09 DIAGNOSIS — K851 Biliary acute pancreatitis without necrosis or infection: Secondary | ICD-10-CM | POA: Diagnosis not present

## 2023-02-09 DIAGNOSIS — E440 Moderate protein-calorie malnutrition: Secondary | ICD-10-CM

## 2023-02-09 DIAGNOSIS — K819 Cholecystitis, unspecified: Secondary | ICD-10-CM | POA: Diagnosis not present

## 2023-02-09 DIAGNOSIS — R531 Weakness: Secondary | ICD-10-CM

## 2023-02-09 LAB — COMPREHENSIVE METABOLIC PANEL
ALT: 101 U/L — ABNORMAL HIGH (ref 0–44)
AST: 71 U/L — ABNORMAL HIGH (ref 15–41)
Albumin: 1.7 g/dL — ABNORMAL LOW (ref 3.5–5.0)
Alkaline Phosphatase: 217 U/L — ABNORMAL HIGH (ref 38–126)
Anion gap: 11 (ref 5–15)
BUN: 34 mg/dL — ABNORMAL HIGH (ref 8–23)
CO2: 21 mmol/L — ABNORMAL LOW (ref 22–32)
Calcium: 8.4 mg/dL — ABNORMAL LOW (ref 8.9–10.3)
Chloride: 102 mmol/L (ref 98–111)
Creatinine, Ser: 1.17 mg/dL (ref 0.61–1.24)
GFR, Estimated: >60 mL/min (ref >60)
Glucose, Bld: 119 mg/dL — ABNORMAL HIGH (ref 70–99)
Potassium: 3.8 mmol/L (ref 3.5–5.1)
Sodium: 134 mmol/L — ABNORMAL LOW (ref 135–145)
Total Bilirubin: 15.6 mg/dL — ABNORMAL HIGH (ref <1.2)
Total Protein: 5.2 g/dL — ABNORMAL LOW (ref 6.5–8.1)

## 2023-02-09 LAB — CBC
HCT: 26.8 % — ABNORMAL LOW (ref 39.0–52.0)
Hemoglobin: 8.9 g/dL — ABNORMAL LOW (ref 13.0–17.0)
MCH: 35.5 pg — ABNORMAL HIGH (ref 26.0–34.0)
MCHC: 33.2 g/dL (ref 30.0–36.0)
MCV: 106.8 fL — ABNORMAL HIGH (ref 80.0–100.0)
Platelets: 274 10*3/uL (ref 150–400)
RBC: 2.51 MIL/uL — ABNORMAL LOW (ref 4.22–5.81)
RDW: 16.7 % — ABNORMAL HIGH (ref 11.5–15.5)
WBC: 11 10*3/uL — ABNORMAL HIGH (ref 4.0–10.5)
nRBC: 0 % (ref 0.0–0.2)

## 2023-02-09 NOTE — Progress Notes (Signed)
Mobility Specialist Progress Note    02/09/23 1424  Mobility  Activity Ambulated with assistance to bathroom  Level of Assistance Minimal assist, patient does 75% or more  Assistive Device Front wheel walker  Distance Ambulated (ft) 20 ft (10+10)  Activity Response Tolerated well  Mobility Referral Yes  Mobility visit 1 Mobility  Mobility Specialist Start Time (ACUTE ONLY) 1400  Mobility Specialist Stop Time (ACUTE ONLY) 1424  Mobility Specialist Time Calculation (min) (ACUTE ONLY) 24 min   Pt received in chair and agreeable. No complaints. Had BM. Returned to bed with call bell in reach. RN present.   [place] Mobility Specialist  Please Neurosurgeon or Rehab Office at [phone]

## 2023-02-09 NOTE — Evaluation (Signed)
Occupational Therapy Evaluation Patient Details Name: Samuel Mayo MRN: [id number] DOB: [birth year] Today's Date: 02/09/2023   History of Present Illness 77 y.o. male presents to [facility] 01/24/23 with worsening jaundice and follow up imaging from recent admit that showed non-visualization of CBD. MRI/MRCP showed increased mild to moderate intrahepatic bile duct dilation and new mural thickening and enhancement of the extrahepatic bile duct suspicious for cholangitis. IR performed PTC 11/29 with placement of internal/external drain. 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 Recent admit from 10/28 -11/1 for cholecystitis, diagnosed with Mirizzi syndrome ,underwent cholecystostomy. PMHx: seizure disorder on depakote, PAF, HTN, a-fib, acute biliary pancreatitis, cholecystitis, gout   Clinical Impression   PTA, pt lived at home and was mod I. Pt needing up to mod A for LB ADL and min A for transfers during session. Pt distracted by presence of family in room; limited cognitive assessment with pt oriented and able to answer basic questions about family. Pt with decreased safety awareness throughout session. Patient will benefit from continued inpatient follow up therapy, <3 hours/day       If plan is discharge home, recommend the following: A little help with walking and/or transfers;A little help with bathing/dressing/bathroom;Assistance with cooking/housework;Assist for transportation;Help with stairs or ramp for entrance    Functional Status Assessment  Patient has had a recent decline in their functional status and demonstrates the ability to make significant improvements in function in a reasonable and predictable amount of time.  Equipment Recommendations  Other (comment) (defer)    Recommendations for Other Services       Precautions / Restrictions Precautions Precautions: Fall Precaution Comments: biliary tubes R UQ Restrictions Weight Bearing Restrictions: No       Mobility Bed Mobility Overal bed mobility: Needs Assistance Bed Mobility: Supine to Sit     Supine to sit: Contact guard          Transfers Overall transfer level: Needs assistance Equipment used: None Transfers: Sit to/from Stand Sit to Stand: Contact guard assist           General transfer comment: for safety; cues for hand placement      Balance Overall balance assessment: Needs assistance Sitting-balance support: No upper extremity supported, Feet supported Sitting balance-Leahy Scale: Good     Standing balance support: No upper extremity supported, During functional activity Standing balance-Leahy Scale: Fair Standing balance comment: able to stand statically without UE support, prefers to have hand on stable surface though                           ADL either performed or assessed with clinical judgement   ADL Overall ADL's : Needs assistance/impaired Eating/Feeding: Set up;Sitting Eating/Feeding Details (indicate cue type and reason): EOB Grooming: Oral care;Contact guard assist;Standing   Upper Body Bathing: Set up;Sitting   Lower Body Bathing: Minimal assistance;Sit to/from stand   Upper Body Dressing : Set up;Sitting   Lower Body Dressing: Moderate assistance;Sit to/from stand   Toilet Transfer: Contact guard assist;Rolling walker (2 wheels);Cueing for safety           Functional mobility during ADLs: Contact guard assist;Rolling walker (2 wheels)       Vision Baseline Vision/History: 1 Wears glasses Ability to See in Adequate Light: 0 Adequate Patient Visual Report: No change from baseline Vision Assessment?: No apparent visual deficits Additional Comments: not formally assessed, but able to read signage on wall     Perception  Praxis         Pertinent Vitals/Pain Pain Assessment Pain Assessment: No/denies pain     Extremity/Trunk Assessment Upper Extremity Assessment Upper Extremity Assessment:  Generalized weakness   Lower Extremity Assessment Lower Extremity Assessment: Defer to PT evaluation   Cervical / Trunk Assessment Cervical / Trunk Assessment: Normal   Communication Communication Communication: No apparent difficulties Cueing Techniques: Verbal cues;Gestural cues   Cognition Arousal: Alert Behavior During Therapy: Flat affect Overall Cognitive Status: Impaired/Different from baseline Area of Impairment: Memory, Following commands, Safety/judgement, Awareness                 Orientation Level:  (use of compensatory strategies to determine day/date)   Memory: Decreased short-term memory Following Commands: Follows one step commands consistently, Follows multi-step commands with increased time Safety/Judgement: Decreased awareness of safety, Decreased awareness of deficits Awareness: Emergent   General Comments: Needs further cognitive assessment. Highly distracted by presence of family in room.     General Comments  VSS on RA    Exercises     Shoulder Instructions      Home Living Family/patient expects to be discharged to:: Private residence Living Arrangements: Spouse/significant other Available Help at Discharge: Family;Available 24 hours/day Type of Home: House Home Access: Stairs to enter [employer] of Steps: 2 Entrance Stairs-Rails: Can reach both;Left;Right Home Layout: One level     Bathroom Shower/Tub: Producer, television/film/video: Standard     Home Equipment: Grab bars - toilet;Grab bars - tub/shower          Prior Functioning/Environment Prior Level of Function : Independent/Modified Independent             Mobility Comments: independent w/ no AD ADLs Comments: ind        OT Problem List: Decreased strength;Impaired balance (sitting and/or standing);Decreased activity tolerance;Decreased cognition;Decreased safety awareness;Decreased knowledge of use of DME or AE      OT Treatment/Interventions:  Self-care/ADL training;Therapeutic exercise;DME and/or AE instruction;Balance training;Patient/family education;Therapeutic activities;Cognitive remediation/compensation    OT Goals(Current goals can be found in the care plan section) Acute Rehab OT Goals Patient Stated Goal: sit EOB and eat lunch with my family OT Goal Formulation: With patient Time For Goal Achievement: 02/23/23 Potential to Achieve Goals: Good  OT Frequency: Min 1X/week    Co-evaluation              AM-PAC OT "6 Clicks" Daily Activity     Outcome Measure Help from another person eating meals?: A Little Help from another person taking care of personal grooming?: A Little Help from another person toileting, which includes using toliet, bedpan, or urinal?: A Little Help from another person bathing (including washing, rinsing, drying)?: A Little Help from another person to put on and taking off regular upper body clothing?: A Little Help from another person to put on and taking off regular lower body clothing?: A Lot 6 Click Score: 17   End of Session Equipment Utilized During Treatment: Gait belt;Rolling walker (2 wheels) Nurse Communication: Mobility status  Activity Tolerance: Patient tolerated treatment well Patient left: with family/visitor present;with bed alarm set;with nursing/sitter in room;Other (comment) (EOB with NT and family in room; pt eating)  OT Visit Diagnosis: Unsteadiness on feet (R26.81);Muscle weakness (generalized) (M62.81);Other symptoms and signs involving cognitive function                Time: 1610-9604 OT Time Calculation (min): 18 min Charges:  OT General Charges $OT Visit: 1 Visit OT Evaluation $OT  Eval Moderate Complexity: 1 Mod  Samuel Mayo, OTR/L [facility] Acute Rehabilitation Office: [phone]   Myrla Halsted 02/09/2023, 3:51 PM

## 2023-02-09 NOTE — Progress Notes (Signed)
PROGRESS NOTE  Onis Esty Kington  LOV:[id number] DOB: [birth year] DOA: 01/24/2023 PCP: [name], [name], MD   Brief Narrative: Patient is a 77 year old male with history of hypertension, atrial fibrillation, seizures, gout, MVR s/p angioplasty who presented with worsening jaundice.  Patient was recently admitted from 10/28 -11/1 for cholecystitis, diagnosed with Mirizzi syndrome ,underwent cholecystostomy.  Follow-up imaging showed persistent occlusion of the cystic duct, elevated liver enzymes so recommended to go to the emergency department.  Patient also  reported decreased appetite, weakness.  On presentation, he was hemodynamically stable.  Lab work showed sodium of 127, lipase of 1228, elevated liver enzymes, bilirubin of 20.  General surgery, GI consulted.  Attempted  ERCP but turned out to be unsuccessful.  IR did PTC with biliary drain placement on 11/29.  On 12/1,patient became febrile last night, hypotensive, tachycardic.  Underlying sepsis suspected.  Critical care , palliaitive care consulted.  Now clinically improving.  IR placed  left-sided biliary drain on 12/6. Plan for SnF  12/8: Vital stable, sodium improved to 134, improving transaminitis, and T. bili at 15.6.  Improving leukocytosis, significant macrocytosis. Blood cultures from 12/6 growing Pseudomonas aeruginosa.  CA 19-9 was significantly elevated at 939, AFP normal. Oncology was also consulted today at patient's request. Significant other was concerned that she was unable to make appointment with [facility] hepatobiliary surgery at this time.  Assessment & Plan:  Principal Problem:   Obstructive jaundice Active Problems:   Atrial fibrillation, chronic (HCC)   Hypertension   Seizure (HCC)   Cholecystitis   Acute biliary pancreatitis   Malnutrition of moderate degree   Hyperbilirubinemia   DNR (do not resuscitate)   DNR (do not resuscitate) discussion   Palliative care by specialist   Weakness  generalized   Obstructive jaundice: Initially admitted on 10/28 when he presented with jaundice.  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  Underwent percutaneous cholecystomy tube placement on 10/31 and was discharged home with oral antibiotics.  Outpatient labs showed persistently elevated liver enzymes. Drain study on 11/22 by IR showed persistent occlusion of cystic duct and no opacification of CBD.  MRCP showed dilated intrahepatic bile duct with thickening of the extrahepatic bile duct, suspicious for cholangitis.  Continues to have severe elevated bilirubin, alkaline phosphatase, hyponatremia.  General surgery, GI consulted.  Currently on Unasyn. Denies abdomen pain, nausea or vomiting today.  Remains intensely jaundiced. Attempted  ERCP on 11/27  but turned out to be unsuccessful.   IR did PTC with biliary drain placement on 11/29.  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    He will follow-up with Dr. Allen(hepatobiliary surgery) as an  outpatient at [facility].    Bilirubin  plateaued in the range of 14.IR placed left-sided biliary drain on 12/6.  Follow-up cytology sent from biliary brushings. -Oncology was consulted at patient's request today-message sent to Dr. Pamelia Hoit  Sepsis: Became febrile on the night of 11/30, developed leukocytosis.   CT abdomen/pelvis ordered without finding of any acute problem.  Culture ordered, no growth till date.   No leukocytosis.  Antibiotics changed to Augmentin but after the left-sided biliary drain placement on 12/6 , he developed rigors,fever, developed leukocytosis.  Ordered  blood cultures again, restarted Zosyn.  Improving leukocytosis.  Blood cultures from 12/6 growing Pseudomonas aeruginosa. -Continue Zosyn-will need 14 days of antibiotic.  Hyponatremia: Baseline sodium runs low in the range of 120-132.  Sodium dropped to the range of 122, prompting nephrology consult.  Started  on urea, Lasix now  stopped.  Likely SIADH.   Lasix discontinued Sodium slowly improving and now at 134.  AKI: Likely hemodynamically mediated, patient was hypotensive .  Now resolved  Hypertension: On amlodipine, Toprol at home.  Currently on hold.  Continue midodrine  Paroxysmal A-fib: He was On metoprolol which is on hold because of soft blood pressure. Eliquis was on hold .  Will resume  Remains in normal sinus rhythm at present.  Seizure disorder: On Depakote.  Elevated liver enzymes is not from Depakote but from obstructive liver disease.  History of mitral valve regurgitation: Status post annuloplasty.  Follow-up with cardiology as an outpatient  Goals of care: Palliative care, critical care consulted.  Patient might have underlying biliary tumor which looks like unresectable as per general surgery.  Poor prognosis.  CODE STATUS changed to DNR.  Might be a candidate for hospice/comfort care if further declines.    Deconditioning/debility: PT/OT recommending home health.  Wife requesting for a SNF.  Now the plan  is for SNF, TOC following   Nutrition Problem: Severe Malnutrition Etiology: acute illness    DVT prophylaxis:SCDs Start: 01/24/23 1337 apixaban (ELIQUIS) tablet 5 mg     Code Status: Limited: Do not attempt resuscitation (DNR) -DNR-LIMITED -Do Not Intubate/DNI   Family Communication: Significant other at bedside  Patient status:Inpatient  Patient is from :Home  Anticipated discharge to:  SnF  Estimated DC date: 2-3 days    Consultants: GI, general surgery, PCCM, palliative care  Procedures: ERCP, PTC  Antimicrobials:  Anti-infectives (From admission, onward)    Start     Dose/Rate Route Frequency Ordered Stop   02/07/23 1445  piperacillin-tazobactam (ZOSYN) IVPB 3.375 g        3.375 g 12.5 mL/hr over 240 Minutes Intravenous Every 8 hours 02/07/23 1347     02/07/23 1002  cefTRIAXone (ROCEPHIN) 2 g in sodium chloride 0.9 % 100 mL IVPB        over 30 Minutes Intravenous  Continuous PRN 02/07/23 1011 02/07/23 1017   02/07/23 0945  cefTRIAXone (ROCEPHIN) 2 g in sodium chloride 0.9 % 100 mL IVPB        2 g 200 mL/hr over 30 Minutes Intravenous To Radiology 02/07/23 0845 02/08/23 0945   02/06/23 1000  amoxicillin-clavulanate (AUGMENTIN) 875-125 MG per tablet 1 tablet  Status:  Discontinued        1 tablet Oral Every 12 hours 02/05/23 1108 02/07/23 1347   02/05/23 1000  Vancomycin (VANCOCIN) 1,500 mg in sodium chloride 0.9 % 500 mL IVPB        1,500 mg 250 mL/hr over 120 Minutes Intravenous Every 24 hours 02/05/23 0903 02/05/23 1229   02/04/23 1000  vancomycin (VANCOCIN) IVPB 1000 mg/200 mL premix  Status:  Discontinued        1,000 mg 200 mL/hr over 60 Minutes Intravenous Every 24 hours 02/03/23 1122 02/05/23 0903   02/03/23 1000  Vancomycin (VANCOCIN) 1,500 mg in sodium chloride 0.9 % 500 mL IVPB  Status:  Discontinued        1,500 mg 250 mL/hr over 120 Minutes Intravenous Every 24 hours 02/02/23 1006 02/03/23 1122   02/02/23 2100  vancomycin (VANCOCIN) IVPB 1000 mg/200 mL premix  Status:  Discontinued        1,000 mg 200 mL/hr over 60 Minutes Intravenous Every 12 hours 02/02/23 0811 02/02/23 1006   02/02/23 1100  vancomycin (VANCOCIN) IVPB 1000 mg/200 mL premix       Placed in "Followed by" Linked  Group   1,000 mg 200 mL/hr over 60 Minutes Intravenous  Once 02/02/23 0913 02/02/23 1500   02/02/23 1000  vancomycin (VANCOCIN) IVPB 1000 mg/200 mL premix       Placed in "Followed by" Linked Group   1,000 mg 200 mL/hr over 60 Minutes Intravenous  Once 02/02/23 0913 02/02/23 1340   02/02/23 0900  vancomycin (VANCOREADY) IVPB 2000 mg/400 mL  Status:  Discontinued        2,000 mg 200 mL/hr over 120 Minutes Intravenous  Once 02/02/23 0804 02/02/23 0913   02/02/23 0900  piperacillin-tazobactam (ZOSYN) IVPB 3.375 g        3.375 g 12.5 mL/hr over 240 Minutes Intravenous Every 8 hours 02/02/23 0805 02/05/23 2359   02/01/23 2200  amoxicillin-clavulanate (AUGMENTIN)  875-125 MG per tablet 1 tablet  Status:  Discontinued        1 tablet Oral Every 12 hours 02/01/23 1150 02/02/23 0738   01/31/23 1058  cefOXitin (MEFOXIN) 2 g in sodium chloride 0.9 % 100 mL IVPB        over 30 Minutes  Continuous PRN 01/31/23 1058 01/31/23 1058   01/31/23 0000  cefOXitin (MEFOXIN) 2 g in sodium chloride 0.9 % 100 mL IVPB  Status:  Discontinued        2 g 200 mL/hr over 30 Minutes Intravenous To Radiology 01/30/23 1014 01/30/23 1015   01/25/23 1600  ampicillin-sulbactam (UNASYN) 1.5 g in sodium chloride 0.9 % 100 mL IVPB  Status:  Discontinued        1.5 g 200 mL/hr over 30 Minutes Intravenous Every 6 hours 01/25/23 1525 02/01/23 1150       Subjective: Patient was seen and examined today.  No nausea or vomiting.  Significant other at bedside was very concerned about persistent jaundice, they were also requesting to see an oncologist.  She was unable to make an appointment with hepatobiliary surgery at [facility] as recommended before stating that they wanted him to be discharged first.  Objective: Vitals:   02/08/23 1700 02/08/23 2001 02/08/23 2258 02/09/23 0359  BP: 111/72 96/64 96/63  101/64  Pulse: 93 100 96 96  Resp:  20 20 (!) 27  Temp:  99.4 F (37.4 C) 99 F (37.2 C) 99.2 F (37.3 C)  TempSrc:  Oral Oral Oral  SpO2: 99% 96% 96% 95%  Weight:      Height:        Intake/Output Summary (Last 24 hours) at 02/09/2023 0810 Last data filed at 02/09/2023 0400 Gross per 24 hour  Intake 1635.9 ml  Output 2660 ml  Net -1024.1 ml   Filed Weights   01/24/23 0953 02/05/23 0223  Weight: 86.2 kg 82.5 kg    Examination:  General.  Frail elderly man, in no acute distress.  scleral icterus and skin pallor Pulmonary.  Lungs clear bilaterally, normal respiratory effort. CV.  Regular rate and rhythm, no JVD, rub or murmur. Abdomen.  Soft, nontender, nondistended, BS positive. CNS.  Alert and oriented .  No focal neurologic deficit. Extremities.  No edema, no cyanosis,  pulses intact and symmetrical.   Data Reviewed: I have personally reviewed following labs and imaging studies  CBC: Recent Labs  Lab 02/02/23 1117 02/03/23 0208 02/07/23 0204 02/08/23 0158 02/09/23 0207  WBC 9.6 6.8 7.2 13.1* 11.0*  HGB 10.9* 9.1* 9.3* 9.5* 8.9*  HCT 31.7* 26.6* 28.4* 28.3* 26.8*  MCV 100.3* 99.3 103.6* 104.8* 106.8*  PLT 220 183 241 239 274   Basic Metabolic Panel: Recent Labs  Lab 02/05/23 0205 02/06/23 0203 02/07/23 0204 02/08/23 0158 02/09/23 0207  NA 129* 129* 130* 130* 134*  K 3.7 3.9 3.9 4.6 3.8  CL 96* 94* 95* 97* 102  CO2 24 23 23 22  21*  GLUCOSE 101* 109* 142* 160* 119*  BUN 40* 37* 30* 32* 34*  CREATININE 1.13 0.95 0.92 1.13 1.17  CALCIUM 8.5* 8.7* 8.9 8.3* 8.4*     Recent Results (from the past 240 hour(s))  Culture, blood (Routine X 2) w Reflex to ID Panel     Status: None   Collection Time: 02/02/23  8:03 AM   Specimen: BLOOD LEFT ARM  Result Value Ref Range Status   Specimen Description BLOOD LEFT ARM  Final   Special Requests   Final    BOTTLES DRAWN AEROBIC AND ANAEROBIC Blood Culture results may not be optimal due to an inadequate volume of blood received in culture bottles   Culture   Final    NO GROWTH 5 DAYS Performed at [facility], [address], [place], [place] [address]    Report Status 02/07/2023 FINAL  Final  Culture, blood (Routine X 2) w Reflex to ID Panel     Status: None   Collection Time: 02/02/23  8:04 AM   Specimen: BLOOD LEFT ARM  Result Value Ref Range Status   Specimen Description BLOOD LEFT ARM  Final   Special Requests   Final    BOTTLES DRAWN AEROBIC AND ANAEROBIC Blood Culture results may not be optimal due to an inadequate volume of blood received in culture bottles   Culture   Final    NO GROWTH 5 DAYS Performed at [facility], [address] [place], [place] [address]    Report Status 02/07/2023 FINAL  Final  Culture, blood (Routine X 2) w Reflex to ID Panel     Status: None  (Preliminary result)   Collection Time: 02/07/23  2:28 PM   Specimen: BLOOD  Result Value Ref Range Status   Specimen Description BLOOD SITE NOT SPECIFIED  Final   Special Requests   Final    BOTTLES DRAWN AEROBIC ONLY Blood Culture adequate volume   Culture  Setup Time   Final    GRAM NEGATIVE RODS AEROBIC BOTTLE ONLY CRITICAL RESULT CALLED TO, READ BACK BY AND VERIFIED WITH: Meribeth Mattes [id number] AT 1356 BY EC Performed at [facility], [address] [place], [place] [address]    Culture GRAM NEGATIVE RODS  Final   Report Status PENDING  Incomplete  Blood Culture ID Panel (Reflexed)     Status: Abnormal   Collection Time: 02/07/23  2:28 PM  Result Value Ref Range Status   Enterococcus faecalis NOT DETECTED NOT DETECTED Final   Enterococcus Faecium NOT DETECTED NOT DETECTED Final   Listeria monocytogenes NOT DETECTED NOT DETECTED Final   Staphylococcus species NOT DETECTED NOT DETECTED Final   Staphylococcus aureus (BCID) NOT DETECTED NOT DETECTED Final   Staphylococcus epidermidis NOT DETECTED NOT DETECTED Final   Staphylococcus lugdunensis NOT DETECTED NOT DETECTED Final   Streptococcus species NOT DETECTED NOT DETECTED Final   Streptococcus agalactiae NOT DETECTED NOT DETECTED Final   Streptococcus pneumoniae NOT DETECTED NOT DETECTED Final   Streptococcus pyogenes NOT DETECTED NOT DETECTED Final   A.calcoaceticus-baumannii NOT DETECTED NOT DETECTED Final   Bacteroides fragilis NOT DETECTED NOT DETECTED Final   Enterobacterales NOT DETECTED NOT DETECTED Final   Enterobacter cloacae complex NOT DETECTED NOT DETECTED Final  Escherichia coli NOT DETECTED NOT DETECTED Final   Klebsiella aerogenes NOT DETECTED NOT DETECTED Final   Klebsiella oxytoca NOT DETECTED NOT DETECTED Final   Klebsiella pneumoniae NOT DETECTED NOT DETECTED Final   Proteus species NOT DETECTED NOT DETECTED Final   Salmonella species NOT DETECTED NOT DETECTED Final   Serratia marcescens NOT  DETECTED NOT DETECTED Final   Haemophilus influenzae NOT DETECTED NOT DETECTED Final   Neisseria meningitidis NOT DETECTED NOT DETECTED Final   Pseudomonas aeruginosa DETECTED (A) NOT DETECTED Final    Comment: CRITICAL RESULT CALLED TO, READ BACK BY AND VERIFIED WITH: PHARMD NATHAN GOAD [id number] AT 1356 BY EC    Stenotrophomonas maltophilia NOT DETECTED NOT DETECTED Final   Candida albicans NOT DETECTED NOT DETECTED Final   Candida auris NOT DETECTED NOT DETECTED Final   Candida glabrata NOT DETECTED NOT DETECTED Final   Candida krusei NOT DETECTED NOT DETECTED Final   Candida parapsilosis NOT DETECTED NOT DETECTED Final   Candida tropicalis NOT DETECTED NOT DETECTED Final   Cryptococcus neoformans/gattii NOT DETECTED NOT DETECTED Final   CTX-M ESBL NOT DETECTED NOT DETECTED Final   Carbapenem resistance IMP NOT DETECTED NOT DETECTED Final   Carbapenem resistance KPC NOT DETECTED NOT DETECTED Final   Carbapenem resistance NDM NOT DETECTED NOT DETECTED Final   Carbapenem resistance VIM NOT DETECTED NOT DETECTED Final    Comment: Performed at [facility], [address] [place], [place] [address]  Culture, blood (Routine X 2) w Reflex to ID Panel     Status: None (Preliminary result)   Collection Time: 02/07/23  2:30 PM   Specimen: BLOOD  Result Value Ref Range Status   Specimen Description BLOOD SITE NOT SPECIFIED  Final   Special Requests   Final    BOTTLES DRAWN AEROBIC ONLY Blood Culture adequate volume   Culture  Setup Time   Final    GRAM NEGATIVE RODS AEROBIC BOTTLE ONLY Performed at [facility], [address] [place], [place] [address]    Culture GRAM NEGATIVE RODS  Final   Report Status PENDING  Incomplete     Radiology Studies: IR INT EXT BILIARY DRAIN WITH CHOLANGIOGRAM  Result Date: 02/07/2023 INDICATION: 77 year old gentleman with history of biliary obstruction underwent percutaneous cholecystostomy placement on 01/02/2023 and replacement on  01/24/2023. Continued symptoms of jaundice and hyperbilirubinemia prompteda MRI on 01/24/2023. Enhancement of the ductal system was suspicious for cholangitis. Right hepatic biliary drain was placed on 01/31/2023. Imaging from [facility] on 01/31/2023 was suspicious for Klatskin tumor with irregular narrowing of central right and left main hepatic ducts. Despite placement of the right hepatic drain, hyperbilirubinemia and jaundice has persisted. He returns today for placement of left hepatic biliary drain as well as brush biopsy of the stenosed segment of the right hepatic duct through a pre-existing access. EXAM: 1. Ultrasound and fluoroscopy guided left hepatic internal external biliary drain placement 2. Fluoroscopy guided exchange of existing right hepatic internal external biliary drain 3. Brush biopsy of common and right hepatic bile ducts 4. Bilateral percutaneous transhepatic cholangiogram MEDICATIONS: Rocephin 2 g IV; The antibiotic was administered within an appropriate time frame prior to the initiation of the procedure. Demerol IV 50 mg ANESTHESIA/SEDATION: Moderate (conscious) sedation was employed during this procedure. A total of Versed 3 mg and Fentanyl 75 mcg was administered intravenously by the radiology nurse. Total intra-service moderate Sedation Time: 56 minutes. The patient's level of consciousness and vital signs were monitored continuously by radiology nursing throughout the  procedure under my direct supervision. FLUOROSCOPY: Radiation Exposure Index (as provided by the fluoroscopic device): 98 mGy Kerma COMPLICATIONS: None immediate. PROCEDURE: Informed written consent was obtained from the patient after a thorough discussion of the procedural risks, benefits and alternatives. All questions were addressed. Maximal Sterile Barrier Technique was utilized including caps, mask, sterile gowns, sterile gloves, sterile drape, hand hygiene and skin antiseptic. A timeout was performed prior to the  initiation of the procedure. Patient positioned supine on the procedure table. Epigastric and right upper quadrant abdominal wall skin prepped and draped in the usual sterile fashion. Sonographic evaluation of the left hepatic lobe showed mildly dilated ducts. Following local lidocaine administration, a left hepatic lobe duct was successfully accessed with a 21 gauge needle. 21 gauge needle was removed over 0.018 inch guidewire. The guidewire was advanced to the level of the common bile duct. Contrast administered through the right hepatic drain confirmed appropriate positioning within the right hepatic bile ducts and doudenum. The right-sided 55 French drain was cut and removed over 0.035 inch guidewire and replaced with an 8 [place] sheath. The 8 French sheath was advanced beyond the site of right hepatic and common bile duct stenosis. Two brush biopsies were performed at the stenotic segments of the common and right main hepatic ducts. Both samples were sent to pathology in sterile saline. New 12 French internal external biliary drain was inserted over the 0.035 inch guidewire. The pigtail was formed within the doudenum. Attention was again turned to the left hepatic lobe. Transitional dilator set was inserted over the 0.018 inch guidewire. Kumpe catheter and stiff glidewire were advanced to the level of the doudenum utilizing fluoroscopic guidance through this access. Kumpe the catheter was removed and a 10 [place] internal external biliary drain was inserted. The distal pigtail was formed within the doudenum. Contrast administered through both drains confirmed appropriate positioning within the doudenum as well as within the intrahepatic bile ducts. Both drains were secured to skin with silk suture and connected to bag. IMPRESSION: 1. Successful brush biopsy of the stenotic segments of the common bile and right main bile ducts. 2. Successful replacement of 12 French right hepatic internal external biliary drain.  3. Successful insertion of 10 French left hepatic internal external biliary drain. Electronically Signed   By: Acquanetta Belling M.D.   On: 02/07/2023 16:36   IR ENDOLUMINAL BX OF BILIARY TREE  Result Date: 02/07/2023 INDICATION: 77 year old gentleman with history of biliary obstruction underwent percutaneous cholecystostomy placement on 01/02/2023 and replacement on 01/24/2023. Continued symptoms of jaundice and hyperbilirubinemia prompteda MRI on 01/24/2023. Enhancement of the ductal system was suspicious for cholangitis. Right hepatic biliary drain was placed on 01/31/2023. Imaging from [facility] on 01/31/2023 was suspicious for Klatskin tumor with irregular narrowing of central right and left main hepatic ducts. Despite placement of the right hepatic drain, hyperbilirubinemia and jaundice has persisted. He returns today for placement of left hepatic biliary drain as well as brush biopsy of the stenosed segment of the right hepatic duct through a pre-existing access. EXAM: 1. Ultrasound and fluoroscopy guided left hepatic internal external biliary drain placement 2. Fluoroscopy guided exchange of existing right hepatic internal external biliary drain 3. Brush biopsy of common and right hepatic bile ducts 4. Bilateral percutaneous transhepatic cholangiogram MEDICATIONS: Rocephin 2 g IV; The antibiotic was administered within an appropriate time frame prior to the initiation of the procedure. Demerol IV 50 mg ANESTHESIA/SEDATION: Moderate (conscious) sedation was employed during this procedure. A total of Versed 3 mg and  Fentanyl 75 mcg was administered intravenously by the radiology nurse. Total intra-service moderate Sedation Time: 56 minutes. The patient's level of consciousness and vital signs were monitored continuously by radiology nursing throughout the procedure under my direct supervision. FLUOROSCOPY: Radiation Exposure Index (as provided by the fluoroscopic device): 98 mGy Kerma COMPLICATIONS: None immediate.  PROCEDURE: Informed written consent was obtained from the patient after a thorough discussion of the procedural risks, benefits and alternatives. All questions were addressed. Maximal Sterile Barrier Technique was utilized including caps, mask, sterile gowns, sterile gloves, sterile drape, hand hygiene and skin antiseptic. A timeout was performed prior to the initiation of the procedure. Patient positioned supine on the procedure table. Epigastric and right upper quadrant abdominal wall skin prepped and draped in the usual sterile fashion. Sonographic evaluation of the left hepatic lobe showed mildly dilated ducts. Following local lidocaine administration, a left hepatic lobe duct was successfully accessed with a 21 gauge needle. 21 gauge needle was removed over 0.018 inch guidewire. The guidewire was advanced to the level of the common bile duct. Contrast administered through the right hepatic drain confirmed appropriate positioning within the right hepatic bile ducts and doudenum. The right-sided 70 French drain was cut and removed over 0.035 inch guidewire and replaced with an 8 [place] sheath. The 8 French sheath was advanced beyond the site of right hepatic and common bile duct stenosis. Two brush biopsies were performed at the stenotic segments of the common and right main hepatic ducts. Both samples were sent to pathology in sterile saline. New 12 French internal external biliary drain was inserted over the 0.035 inch guidewire. The pigtail was formed within the doudenum. Attention was again turned to the left hepatic lobe. Transitional dilator set was inserted over the 0.018 inch guidewire. Kumpe catheter and stiff glidewire were advanced to the level of the doudenum utilizing fluoroscopic guidance through this access. Kumpe the catheter was removed and a 10 [place] internal external biliary drain was inserted. The distal pigtail was formed within the doudenum. Contrast administered through both drains  confirmed appropriate positioning within the doudenum as well as within the intrahepatic bile ducts. Both drains were secured to skin with silk suture and connected to bag. IMPRESSION: 1. Successful brush biopsy of the stenotic segments of the common bile and right main bile ducts. 2. Successful replacement of 12 French right hepatic internal external biliary drain. 3. Successful insertion of 10 French left hepatic internal external biliary drain. Electronically Signed   By: Acquanetta Belling M.D.   On: 02/07/2023 16:36   IR ENDOLUMINAL BX OF BILIARY TREE  Result Date: 02/07/2023 INDICATION: 77 year old gentleman with history of biliary obstruction underwent percutaneous cholecystostomy placement on 01/02/2023 and replacement on 01/24/2023. Continued symptoms of jaundice and hyperbilirubinemia prompteda MRI on 01/24/2023. Enhancement of the ductal system was suspicious for cholangitis. Right hepatic biliary drain was placed on 01/31/2023. 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 Despite placement of the right hepatic drain, hyperbilirubinemia and jaundice has persisted. He returns today for placement of left hepatic biliary drain as well as brush biopsy of the stenosed segment of the right hepatic duct through a pre-existing access. EXAM: 1. Ultrasound and fluoroscopy guided left hepatic internal external biliary drain placement 2. Fluoroscopy guided exchange of existing right hepatic internal external biliary drain 3. Brush biopsy of common and right hepatic bile ducts 4. Bilateral percutaneous transhepatic cholangiogram MEDICATIONS: Rocephin 2 g IV; The antibiotic was  administered within an appropriate time frame prior to the initiation of the procedure. Demerol IV 50 mg ANESTHESIA/SEDATION: Moderate (conscious) sedation was employed during this procedure. A total of Versed 3 mg and Fentanyl 75 mcg was administered  intravenously by the radiology nurse. Total intra-service moderate Sedation Time: 56 minutes. The patient's level of consciousness and vital signs were monitored continuously by radiology nursing throughout the procedure under my direct supervision. FLUOROSCOPY: Radiation Exposure Index (as provided by the fluoroscopic device): 98 mGy Kerma COMPLICATIONS: None immediate. PROCEDURE: Informed written consent was obtained from the patient after a thorough discussion of the procedural risks, benefits and alternatives. All questions were addressed. Maximal Sterile Barrier Technique was utilized including caps, mask, sterile gowns, sterile gloves, sterile drape, hand hygiene and skin antiseptic. A timeout was performed prior to the initiation of the procedure. Patient positioned supine on the procedure table. Epigastric and right upper quadrant abdominal wall skin prepped and draped in the usual sterile fashion. Sonographic evaluation of the left hepatic lobe showed mildly dilated ducts. Following local lidocaine administration, a left hepatic lobe duct was successfully accessed with a 21 gauge needle. 21 gauge needle was removed over 0.018 inch guidewire. The guidewire was advanced to the level of the common bile duct. Contrast administered through the right hepatic drain confirmed appropriate positioning within the right hepatic bile ducts and doudenum. The right-sided 46 French drain was cut and removed over 0.035 inch guidewire and replaced with an 8 [place] sheath. The 8 French sheath was advanced beyond the site of right hepatic and common bile duct stenosis. Two brush biopsies were performed at the stenotic segments of the common and right main hepatic ducts. Both samples were sent to pathology in sterile saline. New 12 French internal external biliary drain was inserted over the 0.035 inch guidewire. The pigtail was formed within the doudenum. Attention was again turned to the left hepatic lobe. Transitional  dilator set was inserted over the 0.018 inch guidewire. Kumpe catheter and stiff glidewire were advanced to the level of the doudenum utilizing fluoroscopic guidance through this access. Kumpe the catheter was removed and a 10 [place] internal external biliary drain was inserted. The distal pigtail was formed within the doudenum. Contrast administered through both drains confirmed appropriate positioning within the doudenum as well as within the intrahepatic bile ducts. Both drains were secured to skin with silk suture and connected to bag. IMPRESSION: 1. Successful brush biopsy of the stenotic segments of the common bile and right main bile ducts. 2. Successful replacement of 12 French right hepatic internal external biliary drain. 3. Successful insertion of 10 French left hepatic internal external biliary drain. Electronically Signed   By: Acquanetta Belling M.D.   On: 02/07/2023 16:36   IR EXCHANGE BILIARY DRAIN  Result Date: 02/07/2023 INDICATION: 77 year old gentleman with history of biliary obstruction underwent percutaneous cholecystostomy placement on 01/02/2023 and replacement on 01/24/2023. Continued symptoms of jaundice and hyperbilirubinemia prompteda MRI on 01/24/2023. Enhancement of the ductal system was suspicious for cholangitis. Right hepatic biliary drain was placed on 01/31/2023. 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 Despite placement of the right hepatic drain, hyperbilirubinemia and jaundice has persisted. He returns today for placement of left hepatic biliary drain as well as brush biopsy of the stenosed segment of the right hepatic duct through a pre-existing access. EXAM: 1. Ultrasound and fluoroscopy guided left hepatic internal external biliary drain placement 2. Fluoroscopy guided exchange  of existing right hepatic internal external biliary drain 3. Brush biopsy of common and right hepatic bile  ducts 4. Bilateral percutaneous transhepatic cholangiogram MEDICATIONS: Rocephin 2 g IV; The antibiotic was administered within an appropriate time frame prior to the initiation of the procedure. Demerol IV 50 mg ANESTHESIA/SEDATION: Moderate (conscious) sedation was employed during this procedure. A total of Versed 3 mg and Fentanyl 75 mcg was administered intravenously by the radiology nurse. Total intra-service moderate Sedation Time: 56 minutes. The patient's level of consciousness and vital signs were monitored continuously by radiology nursing throughout the procedure under my direct supervision. FLUOROSCOPY: Radiation Exposure Index (as provided by the fluoroscopic device): 98 mGy Kerma COMPLICATIONS: None immediate. PROCEDURE: Informed written consent was obtained from the patient after a thorough discussion of the procedural risks, benefits and alternatives. All questions were addressed. Maximal Sterile Barrier Technique was utilized including caps, mask, sterile gowns, sterile gloves, sterile drape, hand hygiene and skin antiseptic. A timeout was performed prior to the initiation of the procedure. Patient positioned supine on the procedure table. Epigastric and right upper quadrant abdominal wall skin prepped and draped in the usual sterile fashion. Sonographic evaluation of the left hepatic lobe showed mildly dilated ducts. Following local lidocaine administration, a left hepatic lobe duct was successfully accessed with a 21 gauge needle. 21 gauge needle was removed over 0.018 inch guidewire. The guidewire was advanced to the level of the common bile duct. Contrast administered through the right hepatic drain confirmed appropriate positioning within the right hepatic bile ducts and doudenum. The right-sided 92 French drain was cut and removed over 0.035 inch guidewire and replaced with an 8 [place] sheath. The 8 French sheath was advanced beyond the site of right hepatic and common bile duct stenosis. Two  brush biopsies were performed at the stenotic segments of the common and right main hepatic ducts. Both samples were sent to pathology in sterile saline. New 12 French internal external biliary drain was inserted over the 0.035 inch guidewire. The pigtail was formed within the doudenum. Attention was again turned to the left hepatic lobe. Transitional dilator set was inserted over the 0.018 inch guidewire. Kumpe catheter and stiff glidewire were advanced to the level of the doudenum utilizing fluoroscopic guidance through this access. Kumpe the catheter was removed and a 10 [place] internal external biliary drain was inserted. The distal pigtail was formed within the doudenum. Contrast administered through both drains confirmed appropriate positioning within the doudenum as well as within the intrahepatic bile ducts. Both drains were secured to skin with silk suture and connected to bag. IMPRESSION: 1. Successful brush biopsy of the stenotic segments of the common bile and right main bile ducts. 2. Successful replacement of 12 French right hepatic internal external biliary drain. 3. Successful insertion of 10 French left hepatic internal external biliary drain. Electronically Signed   By: Acquanetta Belling M.D.   On: 02/07/2023 16:36    Scheduled Meds:  sodium chloride   Intravenous Once   apixaban  5 mg Oral BID   divalproex  750 mg Oral BID   feeding supplement  237 mL Oral BID BM   midodrine  10 mg Oral TID WC   multivitamin with minerals  1 tablet Oral Daily   sodium chloride flush  3 mL Intravenous Q12H   sodium chloride flush  5 mL Intracatheter Q8H   Continuous Infusions:  piperacillin-tazobactam (ZOSYN)  IV 3.375 g (02/09/23 0654)     LOS: 14 days   Arnetha Courser, MD  [facility] P12/10/2022, 8:10 AM

## 2023-02-09 NOTE — Plan of Care (Signed)

## 2023-02-10 DIAGNOSIS — Z515 Encounter for palliative care: Secondary | ICD-10-CM | POA: Diagnosis not present

## 2023-02-10 DIAGNOSIS — K831 Obstruction of bile duct: Secondary | ICD-10-CM | POA: Diagnosis not present

## 2023-02-10 DIAGNOSIS — R17 Unspecified jaundice: Secondary | ICD-10-CM | POA: Diagnosis not present

## 2023-02-10 DIAGNOSIS — E440 Moderate protein-calorie malnutrition: Secondary | ICD-10-CM | POA: Diagnosis not present

## 2023-02-10 DIAGNOSIS — K851 Biliary acute pancreatitis without necrosis or infection: Secondary | ICD-10-CM | POA: Diagnosis not present

## 2023-02-10 LAB — COMPREHENSIVE METABOLIC PANEL
ALT: 87 U/L — ABNORMAL HIGH (ref 0–44)
AST: 70 U/L — ABNORMAL HIGH (ref 15–41)
Albumin: 1.6 g/dL — ABNORMAL LOW (ref 3.5–5.0)
Alkaline Phosphatase: 193 U/L — ABNORMAL HIGH (ref 38–126)
Anion gap: 11 (ref 5–15)
BUN: 40 mg/dL — ABNORMAL HIGH (ref 8–23)
CO2: 21 mmol/L — ABNORMAL LOW (ref 22–32)
Calcium: 8.2 mg/dL — ABNORMAL LOW (ref 8.9–10.3)
Chloride: 98 mmol/L (ref 98–111)
Creatinine, Ser: 1.26 mg/dL — ABNORMAL HIGH (ref 0.61–1.24)
GFR, Estimated: 58 mL/min — ABNORMAL LOW (ref >60)
Glucose, Bld: 89 mg/dL (ref 70–99)
Potassium: 4.1 mmol/L (ref 3.5–5.1)
Sodium: 130 mmol/L — ABNORMAL LOW (ref 135–145)
Total Bilirubin: 13.9 mg/dL — ABNORMAL HIGH (ref <1.2)
Total Protein: 5.3 g/dL — ABNORMAL LOW (ref 6.5–8.1)

## 2023-02-10 LAB — CULTURE, BLOOD (ROUTINE X 2)
Special Requests: ADEQUATE
Special Requests: ADEQUATE

## 2023-02-10 MED ORDER — METRONIDAZOLE 500 MG PO TABS
500.0000 mg | ORAL_TABLET | Freq: Two times a day (BID) | ORAL | Status: AC
  Administered 2023-02-10 – 2023-02-19 (×20): 500 mg via ORAL
  Filled 2023-02-10 (×20): qty 1

## 2023-02-10 MED ORDER — LEVOFLOXACIN IN D5W 750 MG/150ML IV SOLN
750.0000 mg | INTRAVENOUS | Status: DC
Start: 2023-02-10 — End: 2023-02-17
  Administered 2023-02-10 – 2023-02-17 (×8): 750 mg via INTRAVENOUS
  Filled 2023-02-10 (×8): qty 150

## 2023-02-10 NOTE — Consult Note (Signed)
[facility]    Date of Admission:  01/24/2023     Total days of antibiotics 17               Reason for Consult: Pseudomonas bacteremia   Referring Provider: Dr. 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 Suspected source is biliary. Lab work improving slowly with addition of secondary percutaneous drain which will hopefully be providing source control. Pseudomonas is susceptible to levofloxacin and will change antibiotics as it is resistant to cefepime and piperacillin-tazobactam. Recommend treatment for 10 days with levofloxacin initially IV and transition to oral. Reviewed medication administration and potential side effects. Continue with percutaneous drain management per IR and additional biliary duct dilation work up per General Surgery/Oncology. No follow up with ID needed. Remaining medical and supportive care per Internal Medicine.   PLAN:  Change antibiotics to levofloxacin. Can change to oral prior to discharge.  Percutaneous drain management per IR.  Additional biliary work up per General Surgery and Oncology.  Remaining medical and supportive care per Internal Medicine.   ID will sign off. Please re-consult if needed.    Principal Problem:   Obstructive jaundice Active Problems:   Hypertension   Atrial fibrillation, chronic (HCC)   Seizure (HCC)   Cholecystitis   Acute biliary pancreatitis   Malnutrition of moderate degree   Hyperbilirubinemia   DNR (do not resuscitate)   DNR (do not resuscitate) discussion   Palliative care by specialist   Weakness  generalized    sodium chloride   Intravenous Once   apixaban  5 mg Oral BID   divalproex  750 mg Oral BID   feeding supplement  237 mL Oral BID BM   metroNIDAZOLE  500 mg Oral Q12H   midodrine  10 mg Oral TID WC   multivitamin with minerals  1 tablet Oral Daily   sodium chloride flush  3 mL Intravenous Q12H   sodium chloride flush  5 mL Intracatheter Q8H     HPI: Samuel Mayo is a 77 y.o. male with previous medical history of hypertension, atrial fibrillation, seizures and mitral valve replacement s/p annuloplasty presenting to the hospital with worsening jaundice.  Samuel Mayo was previously hospitalized from 12/30/22 until 01/03/23 with cholecystitis with common bile duct dilation. Percutaneous drain placed on 10/31 and discharged on 4 days of Augmentin. 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 Afebrile on admission with MRI Imaging showing increased intrahepatic bile duct dilation and findings suspicious for cholangitis. Started on Unasyn. New right hepatic biliary drain was placed on 01/31/23 and had a subsequent fever of 101.3 F on 11/30. Blood cultures were negative and antibiotics were changed to Vancomycin and Piperacillin-tazobactam. On 02/07/23 had addition fever of 101.6 F. New blood cultures drawn positive for Pseudomonas which was resistant to cefepime and piperacillin-tazobactam.   Samuel Mayo has been afebrile since 02/07/23. LFTs and bilirubin continue to slowly improve. Currently on Day 17 of antimicrobial therapy. Has had about 1500 cc of total drain output over the past 24 hours. Wife is at  bedside.   Review of Systems: Review of Systems  Constitutional:  Negative for chills, fever and weight loss.  Respiratory:  Negative for cough, shortness of breath and wheezing.   Cardiovascular:  Negative for chest pain and leg swelling.  Gastrointestinal:  Positive for abdominal pain.  Negative for constipation, diarrhea, nausea and vomiting.  Skin:  Negative for rash.     Past Medical History:  Diagnosis Date   AK (actinic keratosis)    Allergy    Chicken pox    Compression fracture of L1 lumbar vertebra (HCC)    Compression fracture of thoracic vertebra (HCC)    Hypertension    Lentigines    Follows with DR Culton (derma)   Seizures (HCC)     Social History   Tobacco Use   Smoking status: Never   Smokeless tobacco: Never   Tobacco comments:    Never smoke 10/29/21   Vaping Use   Vaping status: Never Used  Substance Use Topics   Alcohol use: Yes    Alcohol/week: 14.0 standard drinks of alcohol    Types: 14 Standard drinks or equivalent per week    Comment: 2 glasses of wine nightly 10/29/21   Drug use: No    Family History  Problem Relation Age of Onset   Heart disease Mother    Cancer Father    Heart disease Father    Prostate cancer Father    Aneurysm Paternal Grandfather    Heart disease Son        congenital mitral valve dz   Colon cancer Neg Hx    Esophageal cancer Neg Hx    Stomach cancer Neg Hx    Rectal cancer Neg Hx     Allergies  Allergen Reactions   Other Other (See Comments)    Childhood reaction to Neosporin Ophthalmic ointment caused severe eye redness, irritation    OBJECTIVE: Blood pressure 121/84, pulse 99, temperature 98.1 F (36.7 C), temperature source Oral, resp. rate 20, height 5\' 11"  (1.803 m), weight 82.5 kg, SpO2 98%.  Physical Exam Constitutional:      General: Samuel Mayo is not in acute distress.    Appearance: Samuel Mayo is well-developed. Samuel Mayo is ill-appearing.  Cardiovascular:     Rate and Rhythm: Normal rate and regular rhythm.     Heart sounds: Normal heart sounds.  Pulmonary:     Effort: Pulmonary effort is normal.     Breath sounds: Normal breath sounds.  Skin:    General: Skin is warm and dry.     Coloration: Skin is jaundiced.  Neurological:     Mental Status: Samuel Mayo is alert and oriented to person, place, and  time.  Psychiatric:        Mood and Affect: Mood normal.     Lab Results Lab Results  Component Value Date   WBC 11.0 (H) 02/09/2023   HGB 8.9 (L) 02/09/2023   HCT 26.8 (L) 02/09/2023   MCV 106.8 (H) 02/09/2023   PLT 274 02/09/2023    Lab Results  Component Value Date   CREATININE 1.26 (H) 02/10/2023   BUN 40 (H) 02/10/2023   NA 130 (L) 02/10/2023   K 4.1 02/10/2023   CL 98 02/10/2023   CO2 21 (L) 02/10/2023    Lab Results  Component Value Date   ALT 87 (H) 02/10/2023   AST 70 (H) 02/10/2023   ALKPHOS 193 (H) 02/10/2023   BILITOT 13.9 (H) 02/10/2023     Microbiology: Recent Results (from the past 240 hour(s))  Culture, blood (Routine X 2) w Reflex to ID Panel     Status: None   Collection Time: 02/02/23  8:03 AM   Specimen: BLOOD LEFT ARM  Result Value Ref Range Status   Specimen Description BLOOD LEFT ARM  Final   Special Requests   Final    BOTTLES DRAWN AEROBIC AND ANAEROBIC Blood Culture results may not be optimal due to an inadequate volume of blood received in culture bottles   Culture   Final    NO GROWTH 5 DAYS Performed at [facility], [address] [place], [place] [address]    Report Status 02/07/2023 FINAL  Final  Culture, blood (Routine X 2) w Reflex to ID Panel     Status: None   Collection Time: 02/02/23  8:04 AM   Specimen: BLOOD LEFT ARM  Result Value Ref Range Status   Specimen Description BLOOD LEFT ARM  Final   Special Requests   Final    BOTTLES DRAWN AEROBIC AND ANAEROBIC Blood Culture results may not be optimal due to an inadequate volume of blood received in culture bottles   Culture   Final    NO GROWTH 5 DAYS Performed at [facility], [address], [place], [place] [address]    Report Status 02/07/2023 FINAL  Final  Culture, blood (Routine X 2) w Reflex to ID Panel     Status: Abnormal   Collection Time: 02/07/23  2:28 PM   Specimen: BLOOD  Result Value Ref Range Status   Specimen Description BLOOD SITE NOT  SPECIFIED  Final   Special Requests   Final    BOTTLES DRAWN AEROBIC ONLY Blood Culture adequate volume   Culture  Setup Time   Final    GRAM NEGATIVE RODS AEROBIC BOTTLE ONLY CRITICAL RESULT CALLED TO, READ BACK BY AND VERIFIED WITH: Meribeth Mattes [id number] AT 1356 BY EC Performed at [facility], [address] [place], [place] [address]    Culture PSEUDOMONAS AERUGINOSA (A)  Final   Report Status 02/10/2023 FINAL  Final   Organism ID, Bacteria PSEUDOMONAS AERUGINOSA  Final      Susceptibility   Pseudomonas aeruginosa - MIC*    CEFTAZIDIME 16 INTERMEDIATE Intermediate     CIPROFLOXACIN <=0.25 SENSITIVE Sensitive     GENTAMICIN 2 SENSITIVE Sensitive     IMIPENEM 1 SENSITIVE Sensitive     CEFEPIME 16 RESISTANT Resistant     * PSEUDOMONAS AERUGINOSA  Blood Culture ID Panel (Reflexed)     Status: Abnormal   Collection Time: 02/07/23  2:28 PM  Result Value Ref Range Status   Enterococcus faecalis NOT DETECTED NOT DETECTED Final   Enterococcus Faecium NOT DETECTED NOT DETECTED Final   Listeria monocytogenes NOT DETECTED NOT DETECTED Final   Staphylococcus species NOT DETECTED NOT DETECTED Final   Staphylococcus aureus (BCID) NOT DETECTED NOT DETECTED Final   Staphylococcus epidermidis NOT DETECTED NOT DETECTED Final   Staphylococcus lugdunensis NOT DETECTED NOT DETECTED Final   Streptococcus species NOT DETECTED NOT DETECTED Final   Streptococcus agalactiae NOT DETECTED NOT DETECTED Final   Streptococcus pneumoniae NOT DETECTED NOT DETECTED Final   Streptococcus pyogenes NOT DETECTED NOT DETECTED Final   A.calcoaceticus-baumannii NOT DETECTED NOT DETECTED Final   Bacteroides fragilis NOT DETECTED NOT DETECTED Final   Enterobacterales NOT DETECTED NOT DETECTED Final   Enterobacter cloacae complex NOT DETECTED NOT DETECTED Final   Escherichia coli NOT DETECTED NOT DETECTED Final   Klebsiella aerogenes NOT DETECTED  NOT DETECTED Final   Klebsiella oxytoca NOT DETECTED NOT  DETECTED Final   Klebsiella pneumoniae NOT DETECTED NOT DETECTED Final   Proteus species NOT DETECTED NOT DETECTED Final   Salmonella species NOT DETECTED NOT DETECTED Final   Serratia marcescens NOT DETECTED NOT DETECTED Final   Haemophilus influenzae NOT DETECTED NOT DETECTED Final   Neisseria meningitidis NOT DETECTED NOT DETECTED Final   Pseudomonas aeruginosa DETECTED (A) NOT DETECTED Final    Comment: CRITICAL RESULT CALLED TO, READ BACK BY AND VERIFIED WITH: PHARMD NATHAN GOAD [id number] AT 1356 BY EC    Stenotrophomonas maltophilia NOT DETECTED NOT DETECTED Final   Candida albicans NOT DETECTED NOT DETECTED Final   Candida auris NOT DETECTED NOT DETECTED Final   Candida glabrata NOT DETECTED NOT DETECTED Final   Candida krusei NOT DETECTED NOT DETECTED Final   Candida parapsilosis NOT DETECTED NOT DETECTED Final   Candida tropicalis NOT DETECTED NOT DETECTED Final   Cryptococcus neoformans/gattii NOT DETECTED NOT DETECTED Final   CTX-M ESBL NOT DETECTED NOT DETECTED Final   Carbapenem resistance IMP NOT DETECTED NOT DETECTED Final   Carbapenem resistance KPC NOT DETECTED NOT DETECTED Final   Carbapenem resistance NDM NOT DETECTED NOT DETECTED Final   Carbapenem resistance VIM NOT DETECTED NOT DETECTED Final    Comment: Performed at [facility], [address] [place], [place] [address]  Culture, blood (Routine X 2) w Reflex to ID Panel     Status: Abnormal   Collection Time: 02/07/23  2:30 PM   Specimen: BLOOD  Result Value Ref Range Status   Specimen Description BLOOD SITE NOT SPECIFIED  Final   Special Requests   Final    BOTTLES DRAWN AEROBIC ONLY Blood Culture adequate volume   Culture  Setup Time GRAM NEGATIVE RODS AEROBIC BOTTLE ONLY   Final   Culture (A)  Final    PSEUDOMONAS AERUGINOSA SUSCEPTIBILITIES PERFORMED ON PREVIOUS CULTURE WITHIN THE LAST 5 DAYS. Performed at [facility], [address] [place], [place] [address]    Report Status  02/10/2023 FINAL  Final     Marcos Eke, NP [facility]  02/10/2023  1:28 PM

## 2023-02-10 NOTE — Progress Notes (Signed)
PROGRESS NOTE    Samuel Mayo  OHY:[id number] DOB: 1945/09/02 DOA: 01/24/2023 PCP: Mayo, Samuel G, MD   Brief Narrative:  This 77 year old male with history of hypertension, atrial fibrillation, seizures, gout, MVR s/p angioplasty who presented with worsening jaundice.  Patient was recently admitted from 10/28 -11/1 for cholecystitis, diagnosed with Mirizzi syndrome ,underwent cholecystostomy tube.  Follow-up imaging showed persistent occlusion of the cystic duct, elevated liver enzymes so recommended to go to the emergency department.  Patient also  reported decreased appetite, weakness.  On presentation, he was hemodynamically stable.  Lab work showed sodium of 127, lipase of 1228, elevated liver enzymes, bilirubin of 20.  General surgery, GI consulted.  Attempted  ERCP but turned out to be unsuccessful.  IR did PTC with biliary drain placement on 11/29.  On 12/1, Patient became febrile  and was hypotensive, tachycardic.  Underlying sepsis suspected.  Critical care , palliaitive care consulted.  Now clinically improving.  IR placed  left-sided biliary drain on 12/6. Plan is for SNF Placement.   12/8: Vital stable, Blood cultures from 12/6 growing Pseudomonas aeruginosa.  CA 19-9 was significantly elevated at 939, AFP normal. Oncology was also consulted today at patient's request. 12/9: Reports significant improvement.  Pain is improved.  Bilirubin is trending down.   Assessment & Plan:   Principal Problem:   Obstructive jaundice Active Problems:   Atrial fibrillation, chronic (HCC)   Hypertension   Seizure (HCC)   Cholecystitis   Acute biliary pancreatitis   Malnutrition of moderate degree   Hyperbilirubinemia   DNR (do not resuscitate)   DNR (do not resuscitate) discussion   Palliative care by specialist   Weakness generalized  Obstructive jaundice:  Initially admitted on 10/28 when he presented with jaundice.  Found to have acute cholecystitis with abscess, possible  Mirizzi syndrome.  Underwent percutaneous cholecystomy tube placement on 10/31 and was discharged home with oral antibiotics.  Outpatient labs showed persistently elevated liver enzymes. Drain study on 11/22 by IR showed persistent occlusion of cystic duct and no opacification of CBD.  MRCP showed dilated intrahepatic bile duct with thickening of the extrahepatic bile duct, suspicious for cholangitis.  Continues to have severe elevated bilirubin, alkaline phosphatase, hyponatremia.  General surgery, GI consulted.  Currently on Unasyn. Denies abdomen pain, nausea or vomiting today.  Remains intensely jaundiced. Attempted  ERCP on 11/27  but turned out to be unsuccessful.  IR did PTC with biliary drain placement on 11/29.  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     He will follow-up with Dr. Allen(hepatobiliary surgery) as an  outpatient at [facility].  Bilirubin  plateaued in the range of 14. IR placed left-sided biliary drain on 12/6.   Follow-up cytology sent from biliary brushings. -Oncology was consulted at patient's request today-Follow up Dr. Pamelia Hoit   Sepsis sec.to cholangitis:  Became febrile on the night of 11/30, developed leukocytosis.   CT abdomen/pelvis ordered without finding of any acute problem.  Culture ordered, no growth till date.   No leukocytosis.  Antibiotics changed to Augmentin but after the left-sided biliary drain placement on 12/6 , he developed rigors, fever, developed leukocytosis.  Ordered  blood cultures again, restarted Zosyn.   Now Improving leukocytosis.  Blood cultures from 12/6 growing Pseudomonas aeruginosa. -Continue Zosyn-will need 14 days of antibiotics. Follow up ID.   Hyponatremia:  Baseline sodium runs low in the range of 120-132.  Sodium dropped to the range of 122, prompting nephrology consult.   Started  on urea, Lasix now stopped.  Likely SIADH.   Lasix discontinued Sodium slowly improving and now at 134 >130.   AKI: Likely  hemodynamically mediated, patient was hypotensive .  Now resolving   Hypertension: On amlodipine, Toprol at home.  Currently on hold due to hypotension.  Continue midodrine   Paroxysmal A-fib: He was On metoprolol which is on hold because of soft blood pressure. Eliquis was on hold .  Will resume  Remains in normal sinus rhythm at present.   Seizure disorder: On Depakote.  Elevated liver enzymes is not from Depakote but from obstructive liver disease. Continue to trend  CMP.   History of mitral valve regurgitation: Status post annuloplasty.  Follow-up with cardiology as an outpatient.   Goals of care: Palliative care, critical care consulted.  Patient might have underlying biliary tumor which looks like unresectable as per general surgery.  Poor prognosis.  CODE STATUS changed to DNR.  Might be a candidate for hospice/comfort care if further declines.     Deconditioning/debility: PT/OT recommending home health.  Wife requesting for a SNF.  Now the plan  is for SNF, TOC following     Nutrition Problem: Severe Malnutrition Etiology: acute illness   DVT prophylaxis: SCDs / Eliquis Code Status: DNR Family Communication:  Wife at bed side. Disposition Plan:     Status is: Inpatient Remains inpatient appropriate because:  Anticipated DC to SNF in 1-2 days.   Consultants:  GI General Surgery PCCM Palliative care  Procedures: ERCP/PTC Antimicrobials:  Anti-infectives (From admission, onward)    Start     Dose/Rate Route Frequency Ordered Stop   02/10/23 1030  levofloxacin (LEVAQUIN) IVPB 750 mg        750 mg 100 mL/hr over 90 Minutes Intravenous Every 24 hours 02/10/23 0931     02/10/23 1030  metroNIDAZOLE (FLAGYL) tablet 500 mg        500 mg Oral Every 12 hours 02/10/23 0931     02/07/23 1445  piperacillin-tazobactam (ZOSYN) IVPB 3.375 g  Status:  Discontinued        3.375 g 12.5 mL/hr over 240 Minutes Intravenous Every 8 hours 02/07/23 1347 02/10/23 0931   02/07/23 1002   cefTRIAXone (ROCEPHIN) 2 g in sodium chloride 0.9 % 100 mL IVPB        over 30 Minutes Intravenous Continuous PRN 02/07/23 1011 02/07/23 1017   02/07/23 0945  cefTRIAXone (ROCEPHIN) 2 g in sodium chloride 0.9 % 100 mL IVPB        2 g 200 mL/hr over 30 Minutes Intravenous To Radiology 02/07/23 0845 02/08/23 0945   02/06/23 1000  amoxicillin-clavulanate (AUGMENTIN) 875-125 MG per tablet 1 tablet  Status:  Discontinued        1 tablet Oral Every 12 hours 02/05/23 1108 02/07/23 1347   02/05/23 1000  Vancomycin (VANCOCIN) 1,500 mg in sodium chloride 0.9 % 500 mL IVPB        1,500 mg 250 mL/hr over 120 Minutes Intravenous Every 24 hours 02/05/23 0903 02/05/23 1229   02/04/23 1000  vancomycin (VANCOCIN) IVPB 1000 mg/200 mL premix  Status:  Discontinued        1,000 mg 200 mL/hr over 60 Minutes Intravenous Every 24 hours 02/03/23 1122 02/05/23 0903   02/03/23 1000  Vancomycin (VANCOCIN) 1,500 mg in sodium chloride 0.9 % 500 mL IVPB  Status:  Discontinued        1,500 mg 250 mL/hr over 120 Minutes Intravenous Every 24 hours 02/02/23 1006 02/03/23 1122  02/02/23 2100  vancomycin (VANCOCIN) IVPB 1000 mg/200 mL premix  Status:  Discontinued        1,000 mg 200 mL/hr over 60 Minutes Intravenous Every 12 hours 02/02/23 0811 02/02/23 1006   02/02/23 1100  vancomycin (VANCOCIN) IVPB 1000 mg/200 mL premix       Placed in "Followed by" Linked Group   1,000 mg 200 mL/hr over 60 Minutes Intravenous  Once 02/02/23 0913 02/02/23 1500   02/02/23 1000  vancomycin (VANCOCIN) IVPB 1000 mg/200 mL premix       Placed in "Followed by" Linked Group   1,000 mg 200 mL/hr over 60 Minutes Intravenous  Once 02/02/23 0913 02/02/23 1340   02/02/23 0900  vancomycin (VANCOREADY) IVPB 2000 mg/400 mL  Status:  Discontinued        2,000 mg 200 mL/hr over 120 Minutes Intravenous  Once 02/02/23 0804 02/02/23 0913   02/02/23 0900  piperacillin-tazobactam (ZOSYN) IVPB 3.375 g        3.375 g 12.5 mL/hr over 240 Minutes  Intravenous Every 8 hours 02/02/23 0805 02/05/23 2359   02/01/23 2200  amoxicillin-clavulanate (AUGMENTIN) 875-125 MG per tablet 1 tablet  Status:  Discontinued        1 tablet Oral Every 12 hours 02/01/23 1150 02/02/23 0738   01/31/23 1058  cefOXitin (MEFOXIN) 2 g in sodium chloride 0.9 % 100 mL IVPB        over 30 Minutes  Continuous PRN 01/31/23 1058 01/31/23 1058   01/31/23 0000  cefOXitin (MEFOXIN) 2 g in sodium chloride 0.9 % 100 mL IVPB  Status:  Discontinued        2 g 200 mL/hr over 30 Minutes Intravenous To Radiology 01/30/23 1014 01/30/23 1015   01/25/23 1600  ampicillin-sulbactam (UNASYN) 1.5 g in sodium chloride 0.9 % 100 mL IVPB  Status:  Discontinued        1.5 g 200 mL/hr over 30 Minutes Intravenous Every 6 hours 01/25/23 1525 02/01/23 1150       Subjective: Patient was seen and examined at bedside.  Overnight events noted.   Patient reports pain is reasonably controlled.  Patient has three percutaneous biliary drains.  Objective: Vitals:   02/09/23 2022 02/09/23 2326 02/10/23 0524 02/10/23 0751  BP: 104/75 110/75 104/74 105/72  Pulse: 89 87 82   Resp: 20 20 20 18   Temp: 98.2 F (36.8 C) 99.1 F (37.3 C) 98.6 F (37 C) 98.3 F (36.8 C)  TempSrc: Oral Axillary Oral Oral  SpO2: 97% 98% 97%   Weight:      Height:        Intake/Output Summary (Last 24 hours) at 02/10/2023 1157 Last data filed at 02/10/2023 1610 Gross per 24 hour  Intake 544.21 ml  Output 1135 ml  Net -590.79 ml   Filed Weights   01/24/23 0953 02/05/23 0223  Weight: 86.2 kg 82.5 kg    Examination:  General exam: Appears calm and comfortable, deconditioned, not in any acute distress Respiratory system: Clear to auscultation. Respiratory effort normal. RR 14 Cardiovascular system: S1 & S2 heard, RRR. No JVD, murmurs, rubs, gallops or clicks. No pedal edema. Gastrointestinal system: Abdomen is non distended, soft and  Mildly tender.  Normal bowel sounds heard. PCT x 3 noted. Central nervous  system: Alert and oriented x 3. No focal neurological deficits. Extremities: No edema, no cyanosis, no clubbing. Skin: No rashes, lesions or ulcers Psychiatry: Judgement and insight appear normal. Mood & affect appropriate.     Data Reviewed: I  have personally reviewed following labs and imaging studies  CBC: Recent Labs  Lab 02/07/23 0204 02/08/23 0158 02/09/23 0207  WBC 7.2 13.1* 11.0*  HGB 9.3* 9.5* 8.9*  HCT 28.4* 28.3* 26.8*  MCV 103.6* 104.8* 106.8*  PLT 241 239 274   Basic Metabolic Panel: Recent Labs  Lab 02/06/23 0203 02/07/23 0204 02/08/23 0158 02/09/23 0207 02/10/23 0203  NA 129* 130* 130* 134* 130*  K 3.9 3.9 4.6 3.8 4.1  CL 94* 95* 97* 102 98  CO2 23 23 22  21* 21*  GLUCOSE 109* 142* 160* 119* 89  BUN 37* 30* 32* 34* 40*  CREATININE 0.95 0.92 1.13 1.17 1.26*  CALCIUM 8.7* 8.9 8.3* 8.4* 8.2*   GFR: Estimated Creatinine Clearance: 51.5 mL/min (A) (by C-G formula based on SCr of 1.26 mg/dL (H)). Liver Function Tests: Recent Labs  Lab 02/06/23 0203 02/07/23 0204 02/08/23 0158 02/09/23 0207 02/10/23 0203  AST 98* 112* 125* 71* 70*  ALT 100* 116* 130* 101* 87*  ALKPHOS 219* 276* 284* 217* 193*  BILITOT 14.7* 14.7* 17.0* 15.6* 13.9*  PROT 5.5* 5.6* 5.5* 5.2* 5.3*  ALBUMIN 1.8* 1.8* 1.7* 1.7* 1.6*   No results for input(s): "LIPASE", "AMYLASE" in the last 168 hours. No results for input(s): "AMMONIA" in the last 168 hours. Coagulation Profile: No results for input(s): "INR", "PROTIME" in the last 168 hours. Cardiac Enzymes: No results for input(s): "CKTOTAL", "CKMB", "CKMBINDEX", "TROPONINI" in the last 168 hours. BNP (last 3 results) No results for input(s): "PROBNP" in the last 8760 hours. HbA1C: No results for input(s): "HGBA1C" in the last 72 hours. CBG: No results for input(s): "GLUCAP" in the last 168 hours. Lipid Profile: No results for input(s): "CHOL", "HDL", "LDLCALC", "TRIG", "CHOLHDL", "LDLDIRECT" in the last 72 hours. Thyroid  Function Tests: No results for input(s): "TSH", "T4TOTAL", "FREET4", "T3FREE", "THYROIDAB" in the last 72 hours. Anemia Panel: No results for input(s): "VITAMINB12", "FOLATE", "FERRITIN", "TIBC", "IRON", "RETICCTPCT" in the last 72 hours. Sepsis Labs: No results for input(s): "PROCALCITON", "LATICACIDVEN" in the last 168 hours.  Recent Results (from the past 240 hour(s))  Culture, blood (Routine X 2) w Reflex to ID Panel     Status: None   Collection Time: 02/02/23  8:03 AM   Specimen: BLOOD LEFT ARM  Result Value Ref Range Status   Specimen Description BLOOD LEFT ARM  Final   Special Requests   Final    BOTTLES DRAWN AEROBIC AND ANAEROBIC Blood Culture results may not be optimal due to an inadequate volume of blood received in culture bottles   Culture   Final    NO GROWTH 5 DAYS Performed at [facility], [address], [place], [place] [address]    Report Status 02/07/2023 FINAL  Final  Culture, blood (Routine X 2) w Reflex to ID Panel     Status: None   Collection Time: 02/02/23  8:04 AM   Specimen: BLOOD LEFT ARM  Result Value Ref Range Status   Specimen Description BLOOD LEFT ARM  Final   Special Requests   Final    BOTTLES DRAWN AEROBIC AND ANAEROBIC Blood Culture results may not be optimal due to an inadequate volume of blood received in culture bottles   Culture   Final    NO GROWTH 5 DAYS Performed at [facility], [address] [place], [place] [address]    Report Status 02/07/2023 FINAL  Final  Culture, blood (Routine X 2) w Reflex to ID Panel  Status: Abnormal   Collection Time: 02/07/23  2:28 PM   Specimen: BLOOD  Result Value Ref Range Status   Specimen Description BLOOD SITE NOT SPECIFIED  Final   Special Requests   Final    BOTTLES DRAWN AEROBIC ONLY Blood Culture adequate volume   Culture  Setup Time   Final    GRAM NEGATIVE RODS AEROBIC BOTTLE ONLY CRITICAL RESULT CALLED TO, READ BACK BY AND VERIFIED WITH: Meribeth Mattes [id number] AT  1356 BY EC Performed at [facility], [address] [place], [place] [address]    Culture PSEUDOMONAS AERUGINOSA (A)  Final   Report Status 02/10/2023 FINAL  Final   Organism ID, Bacteria PSEUDOMONAS AERUGINOSA  Final      Susceptibility   Pseudomonas aeruginosa - MIC*    CEFTAZIDIME 16 INTERMEDIATE Intermediate     CIPROFLOXACIN <=0.25 SENSITIVE Sensitive     GENTAMICIN 2 SENSITIVE Sensitive     IMIPENEM 1 SENSITIVE Sensitive     CEFEPIME 16 RESISTANT Resistant     * PSEUDOMONAS AERUGINOSA  Blood Culture ID Panel (Reflexed)     Status: Abnormal   Collection Time: 02/07/23  2:28 PM  Result Value Ref Range Status   Enterococcus faecalis NOT DETECTED NOT DETECTED Final   Enterococcus Faecium NOT DETECTED NOT DETECTED Final   Listeria monocytogenes NOT DETECTED NOT DETECTED Final   Staphylococcus species NOT DETECTED NOT DETECTED Final   Staphylococcus aureus (BCID) NOT DETECTED NOT DETECTED Final   Staphylococcus epidermidis NOT DETECTED NOT DETECTED Final   Staphylococcus lugdunensis NOT DETECTED NOT DETECTED Final   Streptococcus species NOT DETECTED NOT DETECTED Final   Streptococcus agalactiae NOT DETECTED NOT DETECTED Final   Streptococcus pneumoniae NOT DETECTED NOT DETECTED Final   Streptococcus pyogenes NOT DETECTED NOT DETECTED Final   A.calcoaceticus-baumannii NOT DETECTED NOT DETECTED Final   Bacteroides fragilis NOT DETECTED NOT DETECTED Final   Enterobacterales NOT DETECTED NOT DETECTED Final   Enterobacter cloacae complex NOT DETECTED NOT DETECTED Final   Escherichia coli NOT DETECTED NOT DETECTED Final   Klebsiella aerogenes NOT DETECTED NOT DETECTED Final   Klebsiella oxytoca NOT DETECTED NOT DETECTED Final   Klebsiella pneumoniae NOT DETECTED NOT DETECTED Final   Proteus species NOT DETECTED NOT DETECTED Final   Salmonella species NOT DETECTED NOT DETECTED Final   Serratia marcescens NOT DETECTED NOT DETECTED Final   Haemophilus influenzae NOT DETECTED  NOT DETECTED Final   Neisseria meningitidis NOT DETECTED NOT DETECTED Final   Pseudomonas aeruginosa DETECTED (A) NOT DETECTED Final    Comment: CRITICAL RESULT CALLED TO, READ BACK BY AND VERIFIED WITH: PHARMD NATHAN GOAD [id number] AT 1356 BY EC    Stenotrophomonas maltophilia NOT DETECTED NOT DETECTED Final   Candida albicans NOT DETECTED NOT DETECTED Final   Candida auris NOT DETECTED NOT DETECTED Final   Candida glabrata NOT DETECTED NOT DETECTED Final   Candida krusei NOT DETECTED NOT DETECTED Final   Candida parapsilosis NOT DETECTED NOT DETECTED Final   Candida tropicalis NOT DETECTED NOT DETECTED Final   Cryptococcus neoformans/gattii NOT DETECTED NOT DETECTED Final   CTX-M ESBL NOT DETECTED NOT DETECTED Final   Carbapenem resistance IMP NOT DETECTED NOT DETECTED Final   Carbapenem resistance KPC NOT DETECTED NOT DETECTED Final   Carbapenem resistance NDM NOT DETECTED NOT DETECTED Final   Carbapenem resistance VIM NOT DETECTED NOT DETECTED Final    Comment: Performed at [facility], [address] [place], [place] [address]  Culture, blood (Routine X  2) w Reflex to ID Panel     Status: Abnormal   Collection Time: 02/07/23  2:30 PM   Specimen: BLOOD  Result Value Ref Range Status   Specimen Description BLOOD SITE NOT SPECIFIED  Final   Special Requests   Final    BOTTLES DRAWN AEROBIC ONLY Blood Culture adequate volume   Culture  Setup Time GRAM NEGATIVE RODS AEROBIC BOTTLE ONLY   Final   Culture (A)  Final    PSEUDOMONAS AERUGINOSA SUSCEPTIBILITIES PERFORMED ON PREVIOUS CULTURE WITHIN THE LAST 5 DAYS. Performed at [facility], [address], [place], [place] [address]    Report Status 02/10/2023 FINAL  Final    Radiology Studies: No results found.  Scheduled Meds:  sodium chloride   Intravenous Once   apixaban  5 mg Oral BID   divalproex  750 mg Oral BID   feeding supplement  237 mL Oral BID BM   metroNIDAZOLE  500 mg Oral Q12H   midodrine  10 mg  Oral TID WC   multivitamin with minerals  1 tablet Oral Daily   sodium chloride flush  3 mL Intravenous Q12H   sodium chloride flush  5 mL Intracatheter Q8H   Continuous Infusions:  levofloxacin (LEVAQUIN) IV       LOS: 15 days    Time spent: 50 mins    Willeen Niece, MD [facility]   If 7PM-7AM, please contact night-coverage

## 2023-02-10 NOTE — Progress Notes (Signed)
Physical Therapy Treatment Patient Details Name: Samuel Mayo MRN: [id number] DOB: May 12, 1945 Today's Date: 02/10/2023   History of Present Illness 77 y.o. male presents to [facility] 01/24/23 with worsening jaundice and follow up imaging from recent admit that showed non-visualization of CBD. MRI/MRCP showed increased mild to moderate intrahepatic bile duct dilation and new mural thickening and enhancement of the extrahepatic bile duct suspicious for cholangitis. IR performed PTC 11/29 with placement of internal/external drain. 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 Recent admit from 10/28 -11/1 for cholecystitis, diagnosed with Mirizzi syndrome ,underwent cholecystostomy. PMHx: seizure disorder on depakote, PAF, HTN, a-fib, acute biliary pancreatitis, cholecystitis, gout    PT Comments  Patient fatigued after toileting and prolonged standing for hygiene and gown change.  Noted LE tremors while standing, but no buckling and some increased assist sit to stand from low toilet in the bathroom.  Patient and wife speaking with practitioners about rehab prior to d/c home and need for infection to be fully cleared.  PT will continue to follow in the acute setting.     If plan is discharge home, recommend the following: A little help with walking and/or transfers;Help with stairs or ramp for entrance;Assist for transportation   Can travel by private vehicle     Yes  Equipment Recommendations  Rollator (4 wheels)    Recommendations for Other Services       Precautions / Restrictions Precautions Precautions: Fall Precaution Comments: biliary tubes x 3 R UQ     Mobility  Bed Mobility               General bed mobility comments: in bathroom, assisted by mobility    Transfers Overall transfer level: Needs assistance Equipment used: Rolling walker (2 wheels) Transfers: Sit to/from Stand Sit to Stand: Min assist, Mod assist           General transfer comment: cues  for hand placement, help up from low toilet in bathroom    Ambulation/Gait Ambulation/Gait assistance: Min assist, Contact guard assist Gait Distance (Feet): 7 Feet Assistive device: Rolling walker (2 wheels) Gait Pattern/deviations: Step-to pattern, Decreased stride length, Trunk flexed, Wide base of support       General Gait Details: limited to short distance in room to recliner as fatigued after toileting and prolonged standing for hygiene   Stairs             Wheelchair Mobility     Tilt Bed    Modified Rankin (Stroke Patients Only)       Balance Overall balance assessment: Needs assistance Sitting-balance support: Feet supported Sitting balance-Leahy Scale: Good     Standing balance support: Bilateral upper extremity supported Standing balance-Leahy Scale: Poor Standing balance comment: legs shaky standing in bathroom with UE support while PT assisted for hygiene, changing gown, etc                            Cognition Arousal: Alert Behavior During Therapy: Flat affect Overall Cognitive Status: Impaired/Different from baseline Area of Impairment: Memory, Following commands, Safety/judgement, Problem solving                     Memory: Decreased short-term memory Following Commands: Follows one step commands consistently, Follows one step commands with increased time Safety/Judgement: Decreased awareness of safety, Decreased awareness of deficits   Problem Solving: Slow processing          Exercises  General Comments General comments (skin integrity, edema, etc.): VSS on RA with HR 110 after in room ambulation, wife in the room and palliative practitioners      Pertinent Vitals/Pain Pain Assessment Pain Assessment: No/denies pain    Home Living                          Prior Function            PT Goals (current goals can now be found in the care plan section) Progress towards PT goals: Progressing  toward goals    Frequency    Min 1X/week      PT Plan      Co-evaluation              AM-PAC PT "6 Clicks" Mobility   Outcome Measure  Help needed turning from your back to your side while in a flat bed without using bedrails?: A Little Help needed moving from lying on your back to sitting on the side of a flat bed without using bedrails?: A Little Help needed moving to and from a bed to a chair (including a wheelchair)?: A Little Help needed standing up from a chair using your arms (e.g., wheelchair or bedside chair)?: A Little Help needed to walk in hospital room?: A Little Help needed climbing 3-5 steps with a railing? : Total 6 Click Score: 16    End of Session   Activity Tolerance: Patient limited by fatigue Patient left: in chair;with call bell/phone within reach;with family/visitor present   PT Visit Diagnosis: Other abnormalities of gait and mobility (R26.89);Muscle weakness (generalized) (M62.81)     Time: 1610-9604 PT Time Calculation (min) (ACUTE ONLY): 31 min  Charges:    $Gait Training: 8-22 mins $Therapeutic Activity: 8-22 mins PT General Charges $$ ACUTE PT VISIT: 1 Visit                     Sheran Lawless, PT Acute Rehabilitation Services Office:[phone] 02/10/2023    Samuel Mayo 02/10/2023, 1:09 PM

## 2023-02-10 NOTE — Progress Notes (Signed)
Patient ID: Samuel Mayo, male   DOB: [birth year], 77 y.o.   MRN: [id number]    Progress Note from the Palliative Medicine Team at [facility]   Patient Name: Samuel Mayo        Date: 02/10/2023 DOB: April 27, 1945  Age: 77 y.o. MRN#: [id number] Attending Physician: Willeen Niece, MD Primary Care Physician: [name], [name], MD Admit Date: 01/24/2023   Reason for Consultation/Follow-up   Establishing Goals of Care   HPI/ Brief Hospital Review  Patient is a 77 year old male with history of hypertension, atrial fibrillation, seizures, gout, MVR s/p angioplasty who presented with worsening jaundice.  Patient was recently admitted from 10/28 -11/1 for cholecystitis, diagnosed with Mirizzi syndrome ,underwent cholecystostomy.  Follow-up imaging showed persistent occlusion of the cystic duct, elevated liver enzymes so recommended to go to the emergency department.  Patient also  reported decreased appetite, weakness.  On presentation, he was hemodynamically stable.  Lab work showed sodium of 127, lipase of 1228, elevated liver enzymes, bilirubin of 20.  General surgery, GI consulted.  Attempted  ERCP but turned out to be unsuccessful.  IR did PTC with biliary drain placement on 11/29.  On 12/1,patient became febrile last night, hypotensive, tachycardic.  Underlying sepsis suspected.  Critical care , palliaitive care consulted.  Now clinically improving.  Current plan is to continue IV antibiotics, follow-up cultures, allowing improvement in the liver function.    Patient and family face ongoing treatment option decisions, advanced directive decisions and anticipatory care needs.   Subjective  Extensive chart review has been completed prior to meeting with patient/family  including labs, vital signs, imaging, progress/consult notes, orders, medications and available advance directive documents.   This NP assessed patient at the bedside as a follow up for palliative medicine needs  and emotional support.     Patient's wife at bedside  Continued conversation regarding current medical situation.  Patient's wife details the  "new plan"; both patient and his wife remain hopeful for continued improvement, continue antibiotics and discharged to skilled nursing facility for ongoing rehabilitation.  Family is requesting oncology consult here at [facility] health.  Wife verbalizes concern regarding patient's nutritional requirements.   Detailed education offered regarding the importance of nutrition  in overall health and wellness.   Spoke directly with Rance Muir RD, requested that she reach out directly to patient's wife for ongoing education.  We discussed possible need for supplemental nutrition  Continued education regarding current medical situation, patient is optimistic with his ongoing improvement for continued progress.   When medically stable for discharge patient and his wife are in discussion of SNF for short term rehab verse home with home health.   Education offered on the importance of function/mobility in overall wellness and recovery.  Encourage patient to incorporate active range of motion exercises into his daily routine   PMT will continue to support holistically, family encouraged to call with  needs.  Education offered today regarding  the importance of continued conversation with family and their  medical providers regarding overall plan of care and treatment options,  ensuring decisions are within the context of the patients values and GOCs.  Questions and concerns addressed     Time: 50  minutes  Detailed review of medical records ( labs, imaging, vital signs), medically appropriate exam ( MS, skin, cardiac,  resp)   discussed with treatment team, counseling and education to patient, family, staff, documenting clinical information, medication management, coordination of care    Lorinda Creed NP  Palliative Medicine Team Team Phone # [phone] Pager [phone]

## 2023-02-10 NOTE — TOC Progression Note (Addendum)
Transition of Care [facility]) - Progression Note    Patient Details  Name: Samuel Mayo MRN: [id number] Date of Birth: 18-Jul-1945  [facility] CM/SW Contact  Marliss Coots, LCSW Phone Number: 02/10/2023, 12:22 PM  Clinical Narrative:     12:22 PM CSW informed patient and significant other, Tammy Sours, at patient's bedside, that [facility] and [place] denied bed offer to patient. Patient and Nicholos Johns expressed understanding of this and accepted bed offer at [facility] via ambulance transportation for discharge. CSW informed patient and Nicholos Johns that there could be a copay that is unknown until bill is provided to patient. Patient and Nicholos Johns expressed understanding of this. Patient and Nicholos Johns continue to be interested in discharging via ambulance transportation.  2:59 PM Insurance authorization approved.  Expected Discharge Plan: Skilled Nursing Facility Barriers to Discharge: English as a second language teacher, Continued Medical Work up  Expected Discharge Plan and Services In-house Referral: Clinical Social Work Discharge Planning Services: [employer] Consult Post Acute Care Choice: Skilled Nursing Facility Living arrangements for the past 2 months: Single Family Home                                       Social Determinants of Health (SDOH) Interventions SDOH Screenings   Food Insecurity: No Food Insecurity (01/25/2023)  Housing: Patient Declined (01/25/2023)  Transportation Needs: No Transportation Needs (01/25/2023)  Utilities: Not At Risk (01/25/2023)  Alcohol Screen: Low Risk  (10/21/2022)  Depression (PHQ2-9): High Risk (01/13/2023)  Financial Resource Strain: Low Risk  (10/21/2022)  Physical Activity: Insufficiently Active (10/21/2022)  Social Connections: Moderately Integrated (10/21/2022)  Stress: Stress Concern Present (10/21/2022)  Tobacco Use: Low Risk  (01/31/2023)    Readmission Risk Interventions     No data to display

## 2023-02-10 NOTE — Progress Notes (Signed)
Mobility Specialist Progress Note:    02/10/23 1000  Mobility  Activity Ambulated with assistance to bathroom  Level of Assistance Minimal assist, patient does 75% or more  Assistive Device Front wheel walker  Distance Ambulated (ft) 20 ft  Activity Response Tolerated fair  Mobility Referral Yes  Mobility visit 1 Mobility  Mobility Specialist Start Time (ACUTE ONLY) 1035  Mobility Specialist Stop Time (ACUTE ONLY) 1046  Mobility Specialist Time Calculation (min) (ACUTE ONLY) 11 min   Pt received in bed, requesting assistance to BR. Required minA for bed mobility and STS. Pt experienced bowel incontinence while ambulating to BR. Pt left in BR with instructions to pull call string. PT aware, waiting to visit pt.  Samuel Mayo Mobility Specialist Please contact via Special educational needs teacher or Rehab office at [phone]

## 2023-02-10 NOTE — Plan of Care (Signed)
  Problem: Safety: Goal: Ability to remain free from injury will improve Outcome: Progressing   Problem: Respiratory: Goal: Ability to maintain adequate ventilation will improve Outcome: Progressing

## 2023-02-10 NOTE — Plan of Care (Signed)

## 2023-02-10 NOTE — Consult Note (Addendum)
[facility] CONSULT NOTE  Patient Care Team: [name], [name], MD as PCP - General (Family Medicine) Wyline Mayo Alben Spittle, MD as PCP - Cardiology (Cardiology) Mealor, Roberts Gaudy, MD as PCP - Electrophysiology (Cardiology) Morene Crocker, MD as Referring Physician (Neurology)  CHIEF COMPLAINTS/PURPOSE OF CONSULTATION:  Obstructive jaundice/concern for biliary malignancy  REFERRING PHYSICIAN: Dr. Nelson Chimes  HISTORY OF PRESENTING ILLNESS:  Samuel Mayo 77 y.o. male this is a 77 year old male patient who was admitted on 01/24/2023 for lethargy, decreased appetite, obstructive jaundice.  Of note patient was admitted from 12/30/2022 to 01/03/2023 for cholecystitis at which time he was diagnosed with Mirizzi syndrome and had a cholecystostomy tube placed.   Patient was worked up in the ED and seen by GI.  ERCP was attempted but not successful.   IR did place a left-sided biliary drain on 02/07/2023. Past medical history significant for hypertension, A-fib, seizure disorder, and cholecystitis as stated.  Surgical history is noncontributory. Social history includes admission of alcohol use 2 drinks per day.  Denies tobacco use. Oncology is now requested due to concern for biliary malignancy. Patient seen and examined today. Awake and alert, no acute distress.   I have reviewed his chart and materials related to his cancer extensively and collaborated history with the patient. Summary of oncologic history is as follows: Oncology History   No history exists.    ASSESSMENT & PLAN:  Biliary obstruction, possible biliary malignancy -CA 19-9 elevated 939 on 01/31/2023 -Patient was seen by GI and Surgery in October 2024 for worsening hyperbilirubinemia and jaundice at which time he had cholecystostomy and tube placement.  CA 19-9 was elevated but thought at that time to be due to obstruction. 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 -ERCP was attempted but was not successful, IR placed a left-sided biliary drain. -LFTs and total bili also elevated it is noted that LFTs and bilirubin decreased somewhat. -Seen by surgery may be unresectable -Pending cytology from biliary brushings.  Will follow-up on these results. -It is noted that patient's significant other is attempting to make appointments with [facility] hepat[facility] as outpatient.  However patient is now requesting [facility] Oncology consultation.  Dr. Lucille Passy Onc will follow patient as needed.  2.  Acute cholecystitis/obstructive jaundice  -Thought to be Mirizzi syndrome.   -Had percutaneous cholecystostomy tube and was discharged home on oral antibiotics.  3.  Anemia, macrocytic -Hemoglobin 8.9 with MCV 106.8 on 02/09/2023 -Transfuse PRBC for Hgb less than 7.0 -No need to transfuse at this time  4.  Hypertension/A-fib -Monitor BP -On metoprolol and Eliquis, management by medicine    Orders Placed This Encounter  Procedures   Procedural/ Surgical Case Request: ENDOSCOPIC RETROGRADE CHOLANGIOPANCREATOGRAPHY (ERCP) WITH PROPOFOL    Standing Status:   Standing    Number of Occurrences:   1    Order Specific Question:   Pre-op diagnosis    Answer:   biliary obstruction    Order Specific Question:   CPT Code    Answer:   PR ERCP DX COLLECTION SPECIMEN BRUSHING/WASHING [43260]   ERCP    Standing Status:   Standing    Number of Occurrences:   1   Culture, blood (Routine X 2) w Reflex to ID Panel    Standing Status:   Standing    Number of Occurrences:   2   Culture, blood (Routine X 2)  w Reflex to ID Panel    Standing Status:   Standing    Number of Occurrences:   2   Blood Culture ID Panel (Reflexed)    Standing Status:   Standing    Number of Occurrences:   1   MR ABDOMEN MRCP W WO CONTAST    Standing Status:   Standing    Number of Occurrences:   1    Order Specific Question:   If indicated for the ordered  procedure, I authorize the administration of contrast media per Radiology protocol    Answer:   Yes    Order Specific Question:   What is the patient's sedation requirement?    Answer:   No Sedation    Order Specific Question:   Does the patient have a pacemaker or implanted devices?    Answer:   No   MR 3D Recon At Scanner    Standing Status:   Standing    Number of Occurrences:   1    Order Specific Question:   Symptom/Reason for Exam    Answer:   Jaundice [242209]    Order Specific Question:   What is the patient's sedation requirement?    Answer:   No Sedation    Order Specific Question:   Does the patient have a pacemaker or implanted devices?    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  Patient with TB of ~20 and needs drainage.  Eval for ability to do PTC    Standing Status:   Standing    Number of Occurrences:   1    Order Specific Question:   Can the patient sign their own consent?    Answer:   Yes    Order Specific Question:   Is the patient on any bloodthinners?    Answer:   No    Order Specific Question:   Is the patient NPO    Answer:   No    Order Specific Question:   If indicated for the ordered procedure, I authorize the administration of contrast media per Radiology protocol    Answer:   Yes   CT ABDOMEN PELVIS W CONTRAST    Standing Status:   Standing    Number of Occurrences:   1    Order Specific Question:   Does the patient have a contrast media/X-ray dye allergy?    Answer:   No    Order Specific Question:   If indicated for the ordered procedure, I authorize the administration of contrast media per Radiology protocol    Answer:   Yes    Order Specific Question:   If indicated for the ordered procedure, I authorize  the administration of oral contrast media per Radiology protocol    Answer:   Yes   CT ABDOMEN PELVIS WO CONTRAST    R/O intraabdominal bleeding.PTC 2 days ago, now Hb dropped    Standing Status:   Standing    Number of Occurrences:   1    Order Specific Question:   If indicated for the ordered procedure, I authorize the administration of oral contrast media per Radiology  protocol    Answer:   Yes    Order Specific Question:   Does the patient have a contrast media/X-ray dye allergy?    Answer:   No   DG CHEST PORT 1 VIEW    Standing Status:   Standing    Number of Occurrences:   1    Order Specific Question:   Symptom/Reason for Exam    Answer:   Fever [344092]   CT ABDOMEN W CONTRAST    Please send images to Dr. Bryn Gulling to read.    Standing Status:   Standing    Number of Occurrences:   1    Order Specific Question:   Does the patient have a contrast media/X-ray dye allergy?    Answer:   No    Order Specific Question:   If indicated for the ordered procedure, I authorize the administration of contrast media per Radiology protocol    Answer:   Yes    Order Specific Question:   If indicated for the ordered procedure, I authorize the administration of oral contrast media per Radiology protocol    Answer:   Yes   IR INT EXT BILIARY DRAIN WITH CHOLANGIOGRAM    Tentatively for 12/6    Standing Status:   Standing    Number of Occurrences:   1    Order Specific Question:   Can the patient sign their own consent?    Answer:   Yes    Order Specific Question:   Is the patient on any bloodthinners?    Answer:   No    Order Specific Question:   Is the patient NPO    Answer:   Yes    Order Specific Question:   If indicated for the ordered procedure, I authorize the administration of contrast media per Radiology protocol    Answer:   Yes   IR ENDOLUMINAL BX OF BILIARY TREE    Tentatively for 12/6    Standing Status:   Standing    Number of Occurrences:   1    Order Specific Question:   Can  the patient sign their own consent?    Answer:   Yes    Order Specific Question:   Is the patient on any bloodthinners?    Answer:   No    Order Specific Question:   Is the patient NPO    Answer:   Yes    Order Specific Question:   If indicated for the ordered procedure, I authorize the administration of contrast media per Radiology protocol    Answer:   Yes   IR ENDOLUMINAL BX OF BILIARY TREE    Standing Status:   Standing    Number of Occurrences:   1    Order Specific Question:   Can the patient sign their own consent?    Answer:   Yes    Order Specific Question:   Is the patient on any bloodthinners?    Answer:   No    Order Specific Question:   Is the patient NPO    Answer:   Yes    Order Specific Question:   If indicated for the ordered procedure, I authorize the administration of contrast media per Radiology protocol    Answer:   Yes   IR EXCHANGE BILIARY DRAIN    Standing Status:   Standing    Number of Occurrences:   1    Order Specific Question:   Can the patient sign their  own consent?    Answer:   Yes    Order Specific Question:   Is the patient on any bloodthinners?    Answer:   No    Order Specific Question:   Is the patient NPO    Answer:   Yes    Order Specific Question:   If indicated for the ordered procedure, I authorize the administration of contrast media per Radiology protocol    Answer:   Yes   Comprehensive metabolic panel    Standing Status:   Standing    Number of Occurrences:   1   Lipase, blood    Standing Status:   Standing    Number of Occurrences:   1   Urinalysis, Routine w reflex microscopic -Urine, Clean Catch    Standing Status:   Standing    Number of Occurrences:   1    Order Specific Question:   Specimen Source    Answer:   Urine, Clean Catch [76]   Valproic acid level    Standing Status:   Standing    Number of Occurrences:   1   CBC with Differential/Platelet    Standing Status:   Standing    Number of Occurrences:   1    Comprehensive metabolic panel    Standing Status:   Standing    Number of Occurrences:   1   CBC    Standing Status:   Standing    Number of Occurrences:   1   Protime-INR    Standing Status:   Standing    Number of Occurrences:   1   Lipase, blood    Standing Status:   Standing    Number of Occurrences:   1   CBC with Differential/Platelet    Standing Status:   Standing    Number of Occurrences:   1   Comprehensive metabolic panel    Standing Status:   Standing    Number of Occurrences:   3   AFP tumor marker    Standing Status:   Standing    Number of Occurrences:   1   Cancer antigen 19-9    Standing Status:   Standing    Number of Occurrences:   1   CBC    Standing Status:   Standing    Number of Occurrences:   1   Protime-INR    Standing Status:   Standing    Number of Occurrences:   1   Comprehensive metabolic panel    Standing Status:   Standing    Number of Occurrences:   1   Sodium, urine, random    Standing Status:   Standing    Number of Occurrences:   1   Osmolality, urine    Standing Status:   Standing    Number of Occurrences:   1   Osmolality    Standing Status:   Standing    Number of Occurrences:   1   TSH    Standing Status:   Standing    Number of Occurrences:   1   Sodium    Standing Status:   Standing    Number of Occurrences:   2   Cortisol    Standing Status:   Standing    Number of Occurrences:   1   Basic metabolic panel    Standing Status:   Standing    Number of Occurrences:   1   Comprehensive metabolic panel  Standing Status:   Standing    Number of Occurrences:   1   Cancer antigen 19-9    Standing Status:   Standing    Number of Occurrences:   1   AFP tumor marker    Standing Status:   Standing    Number of Occurrences:   1   Glucose, capillary    Standing Status:   Standing    Number of Occurrences:   1   Urinalysis, Routine w reflex microscopic -Urine, Clean Catch    Standing Status:   Standing    Number of  Occurrences:   1    Order Specific Question:   Specimen Source    Answer:   Urine, Clean Catch [76]   Lactic acid, plasma    Standing Status:   Standing    Number of Occurrences:   1   Comprehensive metabolic panel    Standing Status:   Standing    Number of Occurrences:   1   CBC    Standing Status:   Standing    Number of Occurrences:   1   Lactic acid, plasma    Standing Status:   Standing    Number of Occurrences:   1   Procalcitonin    Standing Status:   Standing    Number of Occurrences:   1   Lipase, blood    Standing Status:   Standing    Number of Occurrences:   1   Amylase    Standing Status:   Standing    Number of Occurrences:   1   Comprehensive metabolic panel    Standing Status:   Standing    Number of Occurrences:   1   CBC    Standing Status:   Standing    Number of Occurrences:   1   Comprehensive metabolic panel    Standing Status:   Standing    Number of Occurrences:   1    Order Specific Question:   Specimen collection method    Answer:   IV Team=IV Team collect   Comprehensive metabolic panel    Standing Status:   Standing    Number of Occurrences:   1    Order Specific Question:   Specimen collection method    Answer:   Lab=Lab collect   Comprehensive metabolic panel    Standing Status:   Standing    Number of Occurrences:   1    Order Specific Question:   Specimen collection method    Answer:   Lab=Lab collect   Comprehensive metabolic panel    Standing Status:   Standing    Number of Occurrences:   1    Order Specific Question:   Specimen collection method    Answer:   Lab=Lab collect   CBC    Standing Status:   Standing    Number of Occurrences:   1    Order Specific Question:   Specimen collection method    Answer:   Lab=Lab collect   CBC    Standing Status:   Standing    Number of Occurrences:   1    Order Specific Question:   Specimen collection method    Answer:   Lab=Lab collect   Comprehensive metabolic panel    Standing  Status:   Standing    Number of Occurrences:   1    Order Specific Question:   Specimen collection method    Answer:  Lab=Lab collect   CBC    Standing Status:   Standing    Number of Occurrences:   1    Order Specific Question:   Specimen collection method    Answer:   Lab=Lab collect   Comprehensive metabolic panel    Standing Status:   Standing    Number of Occurrences:   1    Order Specific Question:   Specimen collection method    Answer:   Lab=Lab collect   Comprehensive metabolic panel    Standing Status:   Standing    Number of Occurrences:   1    Order Specific Question:   Specimen collection method    Answer:   Lab=Lab collect   CBC    Standing Status:   Standing    Number of Occurrences:   1    Order Specific Question:   Specimen collection method    Answer:   Lab=Lab collect   Magnesium    Standing Status:   Standing    Number of Occurrences:   1    Order Specific Question:   Specimen collection method    Answer:   Lab=Lab collect   Comprehensive metabolic panel    Standing Status:   Standing    Number of Occurrences:   1    Order Specific Question:   Specimen collection method    Answer:   Lab=Lab collect   Phosphorus    Standing Status:   Standing    Number of Occurrences:   1    Order Specific Question:   Specimen collection method    Answer:   Lab=Lab collect   Diet regular Room service appropriate? Yes; Fluid consistency: Thin    Standing Status:   Standing    Number of Occurrences:   1    Order Specific Question:   Room service appropriate?    Answer:   Yes    Order Specific Question:   Fluid consistency:    Answer:   Thin   Vital signs    Standing Status:   Standing    Number of Occurrences:   1   Notify physician (specify)    Standing Status:   Standing    Number of Occurrences:   20    Order Specific Question:   Notify Physician    Answer:   for pulse less than 55 or greater than 120    Order Specific Question:   Notify Physician    Answer:    for respiratory rate less than 12 or greater than 25    Order Specific Question:   Notify Physician    Answer:   for temperature greater than 100.5 F    Order Specific Question:   Notify Physician    Answer:   for urinary output less than 30 mL/hr for four hours    Order Specific Question:   Notify Physician    Answer:   for systolic BP less than 90 or greater than 160, diastolic BP less than 60 or greater than 100    Order Specific Question:   Notify Physician    Answer:   for new hypoxia w/ oxygen saturations < 88%   Mobility Protocol: No Restrictions    RN to initiate protocols based on patient's level of care    Standing Status:   Standing    Number of Occurrences:   1   Refer to Sidebar Report Refer to ICU, Med-Surg, Progressive, and Step-Down Mobility Protocol Sidebars  Refer to ICU, Med-Surg, Progressive, and Step-Down Mobility Protocol Sidebars    Standing Status:   Standing    Number of Occurrences:   1   Initiate Oral Care Protocol    Standing Status:   Standing    Number of Occurrences:   1   Initiate Carrier Fluid Protocol    Standing Status:   Standing    Number of Occurrences:   1   RN may order General Admission PRN Orders utilizing "General Admission PRN medications" (through manage orders) for the following patient needs: allergy symptoms (Claritin), cold sores (Carmex), cough (Robitussin DM), eye irritation (Liquifilm Tears), hemorrhoids (Tucks), indigestion (Maalox), minor skin irritation (Hydrocortisone Cream), muscle pain Romeo Apple Gay), nose irritation (saline nasal spray) and sore throat (Chloraseptic spray).    Standing Status:   Standing    Number of Occurrences:   [phone]   SCDs    Standing Status:   Standing    Number of Occurrences:   1    Order Specific Question:   Laterality    Answer:   Bilateral   Maintain IV access    Standing Status:   Standing    Number of Occurrences:   1   Strict intake and output    Standing Status:   Standing    Number of  Occurrences:   1   Discontinue cardiac monitoring    Standing Status:   Standing    Number of Occurrences:   1   Informed Consent Details: Physician/Practitioner Attestation; Transcribe to consent form and obtain patient signature    Standing Status:   Standing    Number of Occurrences:   1    Order Specific Question:   Physician/Practitioner attestation of informed consent for procedure/surgical case    Answer:   I, the physician/practitioner, attest that I have discussed with the patient the benefits, risks, side effects, alternatives, likelihood of achieving goals and potential problems during recovery for the procedure that I have provided informed consent.    Order Specific Question:   Procedure    Answer:   Endoscopic Retrograde Cholangiopancreatography    Order Specific Question:   Physician/Practitioner performing the procedure    Answer:   Dr. Leone Payor    Order Specific Question:   Indication/Reason    Answer:   Evaluation and treatment of bile duct obstruction   CBG on all patients with diabetes    Standing Status:   Standing    Number of Occurrences:   1   Pre-admission testing diagnosis    Standing Status:   Standing    Number of Occurrences:   1    Order Specific Question:   Diagnosis    Answer:   Encounter for other preprocedural examination [Z01.818]   Vital signs per Moderate Sedation protocol    Standing Status:   Standing    Number of Occurrences:   1   Off unit privileges    Standing Status:   Standing    Number of Occurrences:   1    Order Specific Question:   Sitter Required    Answer:   No    Order Specific Question:   Location/Special details:    Answer:   may ambulate in hospital   Care order/instruction: Scheduled for IR procedure in am--- we will call for pt when can--- consent signed and in chart    Scheduled for IR procedure in am--- we will call for pt when can--- consent signed and in chart    Standing  Status:   Standing    Number of Occurrences:   1    Patient has an active order for code status: continue current code status order    Standing Status:   Standing    Number of Occurrences:   1   Vital signs every 30 minutes x 4, then per unit routine    every 30 minutes x 4, then per unit routine    Standing Status:   Standing    Number of Occurrences:   1   Notify physician (specify) Call attending physician for temp greater than 101.5 degrees farenheit, SBP less than 90, or Pulse greater than 120.    Call attending physician for temp greater than 101.5 degrees farenheit, SBP less than 90, or Pulse greater than 120.    Standing Status:   Standing    Number of Occurrences:   20   Call Radiologist    For problems or questions regarding the drainage catheter, contact Interventional Radiologist PA or IR on call at [phone].    Standing Status:   Standing    Number of Occurrences:   [phone]   Initiate Oral Care Protocol    Standing Status:   Standing    Number of Occurrences:   1   Initiate Carrier Fluid Protocol    Standing Status:   Standing    Number of Occurrences:   1   Record/measure drainage    #1 39F in Per chole connected to gravity.  #2 49F int/ext biliary drain connected to gravity.  Record output every shift.    Standing Status:   Standing    Number of Occurrences:   1   Flush Drainage Catheter    Flushing regimen: Normal Saline 5 mL three times daily.    Standing Status:   Standing    Number of Occurrences:   1   Change dressing    Do not use scissors near the catheter    Standing Status:   Standing    Number of Occurrences:   1   After flushes reconnect drainage tube to gravity drain    Standing Status:   Standing    Number of Occurrences:   1   Cardiac Monitoring Continuous x 24 hours Indications for use: Syncope of unknown etiology    Standing Status:   Standing    Number of Occurrences:   1    Order Specific Question:   Indications for use:    Answer:   Syncope of unknown etiology   Informed Consent  Details: Physician/Practitioner Attestation; Transcribe to consent form and obtain patient signature    Standing Status:   Standing    Number of Occurrences:   1    Order Specific Question:   Physician/Practitioner attestation of informed consent for blood and or blood product transfusion    Answer:   I, the physician/practitioner, attest that I have discussed with the patient the benefits, risks, side effects, alternatives, likelihood of achieving goals and potential problems during recovery for the procedure that I have provided informed consent.    Order Specific Question:   Product(s)    Answer:   All Product(s)   Cardiac Monitoring Continuous x 24 hours Indications for use: Syncope of unknown etiology    Standing Status:   Standing    Number of Occurrences:   1    Order Specific Question:   Indications for use:    Answer:   Syncope of unknown etiology   Cardiac Monitoring Continuous  x 48 hours Indications for use: Syncope of unknown etiology    Standing Status:   Standing    Number of Occurrences:   1    Order Specific Question:   Indications for use:    Answer:   Syncope of unknown etiology   Do not attempt resuscitation (DNR)- Limited -Do Not Intubate (DNI)    Standing Status:   Standing    Number of Occurrences:   1    Order Specific Question:   If pulseless and not breathing    Answer:   No CPR or chest compressions.    Order Specific Question:   In Pre-Arrest Conditions (Patient Is Breathing and Has A Pulse)    Answer:   Do not intubate. Provide all appropriate non-invasive medical interventions. Avoid ICU transfer unless indicated or required.    Order Specific Question:   Consent:    Answer:   Discussion documented in EHR or advanced directives reviewed   Consult to hospitalist    Standing Status:   Standing    Number of Occurrences:   1    Order Specific Question:   Place call to:    Answer:   [employer] Question:   Reason for Consult    Answer:    Admit   monitoring by pharmacy    Standing Status:   Standing    Number of Occurrences:   1    Order Specific Question:   Reason for Pharmacy Monitoring (*indicate specifics in comment field)    Answer:   Anticoagulant PTA    Order Specific Question:   Comment:    Answer:   Eliquis   Consult to nephrology Consult Timeframe: ROUTINE - requires response within 24 hours; Reason for Consult? severe hyponatremia    Standing Status:   Standing    Number of Occurrences:   1    Order Specific Question:   Consult Timeframe    Answer:   ROUTINE - requires response within 24 hours    Order Specific Question:   Reason for Consult?    Answer:   severe hyponatremia   Consult to palliative care    Standing Status:   Standing    Number of Occurrences:   1    Order Specific Question:   Palliative Care Consult Services    Answer:   Palliative Medicine Consult    Order Specific Question:   Reason for Consult?    Answer:   Presented with severe jaundice.  Might have occult biliary tumor.  Now decompensated likely from underlying severe sepsis.  Goals of care needs to be discussed   Consult to oncology Consult Timeframe: ROUTINE - requires response within 24 hours; Reason for Consult? Concern of biliary tumor    Standing Status:   Standing    Number of Occurrences:   1    Order Specific Question:   Consult Timeframe    Answer:   ROUTINE - requires response within 24 hours    Order Specific Question:   Reason for Consult?    Answer:   Concern of biliary tumor   OT eval and treat    Standing Status:   Standing    Number of Occurrences:   1   PT eval and treat    Standing Status:   Standing    Number of Occurrences:   1   PT eval and treat    Standing Status:   Standing    Number of Occurrences:  1   Oxygen therapy Mode or (Route): Nasal cannula; Liters Per Minute: 2; Keep O2 saturation between: greater than 92 %    Standing Status:   Standing    Number of Occurrences:   1    Order Specific  Question:   Mode or (Route)    Answer:   Nasal cannula    Order Specific Question:   Liters Per Minute    Answer:   2    Order Specific Question:   Keep O2 saturation between    Answer:   greater than 92 %   ED EKG    Standing Status:   Standing    Number of Occurrences:   1    Order Specific Question:   Reason for Exam    Answer:   Chest Pain   EKG    Standing Status:   Standing    Number of Occurrences:   1   Type and screen [facility]    [facility]     Standing Status:   Standing    Number of Occurrences:   1   Prepare RBC (crossmatch)    Standing Status:   Standing    Number of Occurrences:   1    Order Specific Question:   # of Units    Answer:   1 unit    Order Specific Question:   Transfusion Indications    Answer:   Hemoglobin < 7 gm/dL and symptomatic    Order Specific Question:   Number of Units to Keep Ahead    Answer:   NO units ahead    Order Specific Question:   If emergent release call blood bank    Answer:   [facility] [phone]   ABO/Rh    Standing Status:   Standing    Number of Occurrences:   1   Insert peripheral IV    Standing Status:   Standing    Number of Occurrences:   1   Place in observation (patient's expected length of stay will be less than 2 midnights)    Standing Status:   Standing    Number of Occurrences:   1    Order Specific Question:   Hospital Area    Answer:   [facility] [[id number]]    Order Specific Question:   Level of Care    Answer:   Telemetry Medical [104]    Order Specific Question:   May place patient in observation at [facility] or [facility] if equivalent level of care is available:    Answer:   No    Order Specific Question:   Covid Evaluation    Answer:   Asymptomatic - no recent exposure (last 10 days) testing not required    Order Specific Question:   Diagnosis    Answer:   Obstructive jaundice [709956]    Order Specific Question:   Admitting Physician     Answer:   Samuel Mayo [[id number]]    Order Specific Question:   Attending Physician    Answer:   Samuel Mayo [[id number]]   Admit to Inpatient (patient's expected length of stay will be greater than 2 midnights or inpatient only procedure)    Standing Status:   Standing    Number of Occurrences:   1    Order Specific Question:   Hospital Area    Answer:   [facility] [[id number]]    Order Specific  Question:   Level of Care    Answer:   Med-Surg [16]    Order Specific Question:   Covid Evaluation    Answer:   Asymptomatic - no recent exposure (last 10 days) testing not required    Order Specific Question:   Diagnosis    Answer:   Obstructive jaundice [709956]    Order Specific Question:   Admitting Physician    Answer:   [place], ERIC J [[id number]]    Order Specific Question:   Attending Physician    Answer:   [place], ERIC J [[id number]]    Order Specific Question:   Certification:    Answer:   I certify this patient will need inpatient services for at least 2 midnights   Transfer patient    Standing Status:   Standing    Number of Occurrences:   1    Order Specific Question:   Hospital Area    Answer:   [facility] [[id number]]    Order Specific Question:   Level of Care    Answer:   Progressive [102]    Order Specific Question:   Admit to Progressive based on following criteria    Answer:   CARDIOVASCULAR & THORACIC of moderate stability with acute coronary syndrome symptoms/low risk myocardial infarction/hypertensive urgency/arrhythmias/heart failure potentially compromising stability and stable post cardiovascular intervention patients.    Order Specific Question:   Covid Evaluation    Answer:   N/A   Change Level of Care    Standing Status:   Standing    Number of Occurrences:   1    Order Specific Question:   Hospital Area    Answer:   [facility] [[id number]]    Order Specific Question:   Level of Care    Answer:   Med-Surg [16]      MEDICAL HISTORY:  Past Medical History:  Diagnosis Date   AK (actinic keratosis)    Allergy    Chicken pox    Compression fracture of L1 lumbar vertebra (HCC)    Compression fracture of thoracic vertebra (HCC)    Hypertension    Lentigines    Follows with DR Culton (derma)   Seizures (HCC)     SURGICAL HISTORY: Past Surgical History:  Procedure Laterality Date   CARDIOVERSION N/A 05/14/2021   Procedure: CARDIOVERSION;  Surgeon: Little Ishikawa, MD;  Location: [facility] ENDOSCOPY;  Service: Cardiovascular;  Laterality: N/A;   CARDIOVERSION N/A 10/08/2021   Procedure: CARDIOVERSION;  Surgeon: Christell Constant, MD;  Location: [facility] ENDOSCOPY;  Service: Cardiovascular;  Laterality: N/A;   COLONOSCOPY     ESOPHAGOGASTRODUODENOSCOPY N/A 01/29/2023   Procedure: ESOPHAGOGASTRODUODENOSCOPY (EGD);  Surgeon: Iva Boop, MD;  Location: [facility] ENDOSCOPY;  Service: Gastroenterology;  Laterality: N/A;   IR BILIARY DRAIN PLACEMENT WITH CHOLANGIOGRAM  01/31/2023   IR ENDOLUMINAL BX OF BILIARY TREE  02/07/2023   IR ENDOLUMINAL BX OF BILIARY TREE  02/07/2023   IR EXCHANGE BILIARY DRAIN  01/24/2023   IR EXCHANGE BILIARY DRAIN  02/07/2023   IR INT EXT BILIARY DRAIN WITH CHOLANGIOGRAM  02/07/2023   IR PERC CHOLECYSTOSTOMY  01/02/2023   mitral vavle     Congetinal mitral valve disease.    SOCIAL HISTORY: Social History   Socioeconomic History   Marital status: Domestic Partner    Spouse name: Samuel Mayo   Number of children: Not on file   Years of education: Not on file   Highest education level: Bachelor's degree (e.g., BA, AB,  BS)  Occupational History   Not on file  Tobacco Use   Smoking status: Never   Smokeless tobacco: Never   Tobacco comments:    Never smoke 10/29/21   Vaping Use   Vaping status: Never Used  Substance and Sexual Activity   Alcohol use: Yes    Alcohol/week: 14.0 standard drinks of alcohol    Types: 14 Standard drinks or equivalent per week    Comment: 2  glasses of wine nightly 10/29/21   Drug use: No   Sexual activity: Not on file  Other Topics Concern   Not on file  Social History Narrative   Lives with wife Samuel Mayo   R handed   Caffeine: 3 C of coffee AM   Social Determinants of Health   Financial Resource Strain: Low Risk  (10/21/2022)   Overall Financial Resource Strain (CARDIA)    Difficulty of Paying Living Expenses: Not hard at all  Food Insecurity: No Food Insecurity (01/25/2023)   Hunger Vital Sign    Worried About Running Out of Food in the Last Year: Never true    Ran Out of Food in the Last Year: Never true  Transportation Needs: No Transportation Needs (01/25/2023)   PRAPARE - Administrator, Civil Service (Medical): No    Lack of Transportation (Non-Medical): No  Physical Activity: Insufficiently Active (10/21/2022)   Exercise Vital Sign    Days of Exercise per Week: 3 days    Minutes of Exercise per Session: 40 min  Stress: Stress Concern Present (10/21/2022)   [employer] of Occupational Health - Occupational Stress Questionnaire    Feeling of Stress : Rather much  Social Connections: Moderately Integrated (10/21/2022)   Social Connection and Isolation Panel [NHANES]    Frequency of Communication with Friends and Family: Three times a week    Frequency of Social Gatherings with Friends and Family: Once a week    Attends Religious Services: Never    Database administrator or Organizations: No    Attends Engineer, structural: More than 4 times per year    Marital Status: Living with partner  Intimate Partner Violence: Not At Risk (01/25/2023)   Humiliation, Afraid, Rape, and Kick questionnaire    Fear of Current or Ex-Partner: No    Emotionally Abused: No    Physically Abused: No    Sexually Abused: No    FAMILY HISTORY: Family History  Problem Relation Age of Onset   Heart disease Mother    Cancer Father    Heart disease Father    Prostate cancer Father    Aneurysm Paternal  Grandfather    Heart disease Son        congenital mitral valve dz   Colon cancer Neg Hx    Esophageal cancer Neg Hx    Stomach cancer Neg Hx    Rectal cancer Neg Hx     REVIEW OF SYSTEMS:   Constitutional: +fatigue, Denies fevers, chills or abnormal night sweats Eyes: Denies blurriness of vision, double vision or watery eyes Ears, nose, mouth, throat, and face: Denies mucositis or sore throat Respiratory: Denies cough, dyspnea or wheezes Cardiovascular: Denies palpitation, chest discomfort or lower extremity swelling Gastrointestinal: +poor appetite, Denies nausea, heartburn or change in bowel habits Skin: +yellow skin color, Denies abnormal skin rashes Lymphatics: Denies new lymphadenopathy or easy bruising Neurological: Denies numbness, tingling or new weaknesses Behavioral/Psych: Mayo is stable, no new changes  All other systems were reviewed with the patient and are  negative.  PHYSICAL EXAMINATION: ECOG PERFORMANCE STATUS: 3 - Symptomatic, >50% confined to bed  Vitals:   02/10/23 0524 02/10/23 0751  BP: 104/74 105/72  Pulse: 82   Resp: 20 18  Temp: 98.6 F (37 C) 98.3 F (36.8 C)  SpO2: 97%    Filed Weights   01/24/23 0953 02/05/23 0223  Weight: 190 lb (86.2 kg) 181 lb 14.1 oz (82.5 kg)    GENERAL: alert, no distress and comfortable SKIN: +yellow skin color, texture, turgor are normal, no rashes or significant lesions EYES: +scleral icterus  OROPHARYNX: no exudate, no erythema and lips, buccal mucosa, and tongue normal  NECK: supple, thyroid normal size, non-tender, without nodularity LYMPH: no palpable lymphadenopathy in the cervical, axillary or inguinal LUNGS: clear to auscultation and percussion with normal breathing effort HEART: regular rate & rhythm and no murmurs and no lower extremity edema ABDOMEN: abdomen soft, non-tender and normal bowel sounds +drains intact MUSCULOSKELETAL: no cyanosis of digits and no clubbing  PSYCH: alert & oriented x 3 with  fluent speech NEURO: no focal motor/sensory deficits   ALLERGIES:  is allergic to other.  MEDICATIONS:  Current Facility-Administered Medications  Medication Dose Route Frequency Provider Last Rate Last Admin   0.9 %  sodium chloride infusion (Manually program via Guardrails IV Fluids)   Intravenous Once Burnadette Pop, MD       acetaminophen (TYLENOL) tablet 650 mg  650 mg Oral Q4H PRN Burnadette Pop, MD   650 mg at 02/08/23 0426   apixaban (ELIQUIS) tablet 5 mg  5 mg Oral BID Burnadette Pop, MD   5 mg at 02/10/23 0849   diclofenac Sodium (VOLTAREN) 1 % topical gel 2 g  2 g Topical QID PRN Anthoney Harada, NP       divalproex (DEPAKOTE) DR tablet 750 mg  750 mg Oral BID Samuel Fail, MD   750 mg at 02/10/23 0849   feeding supplement (ENSURE ENLIVE / ENSURE PLUS) liquid 237 mL  237 mL Oral BID BM Adhikari, Amrit, MD   237 mL at 02/10/23 0849   HYDROmorphone (DILAUDID) injection 0.5 mg  0.5 mg Intravenous Q4H PRN Burnadette Pop, MD   0.5 mg at 02/07/23 1729   levofloxacin (LEVAQUIN) IVPB 750 mg  750 mg Intravenous Q24H Gardiner Barefoot, MD       metroNIDAZOLE (FLAGYL) tablet 500 mg  500 mg Oral Q12H Comer, Belia Heman, MD       midodrine (PROAMATINE) tablet 10 mg  10 mg Oral TID WC Burnadette Pop, MD   10 mg at 02/10/23 0602   multivitamin with minerals tablet 1 tablet  1 tablet Oral Daily Burnadette Pop, MD   1 tablet at 02/10/23 0849   polyethylene glycol (MIRALAX / GLYCOLAX) packet 17 g  17 g Oral Daily PRN Samuel Fail, MD       sodium chloride flush (NS) 0.9 % injection 3 mL  3 mL Intravenous Q12H Samuel Fail, MD   3 mL at 02/10/23 1104   sodium chloride flush (NS) 0.9 % injection 5 mL  5 mL Intracatheter Q8H Samuel Mor, DO   15 mL at 02/10/23 0604     LABORATORY DATA:  I have reviewed the data as listed Lab Results  Component Value Date   WBC 11.0 (H) 02/09/2023   HGB 8.9 (L) 02/09/2023   HCT 26.8 (L) 02/09/2023   MCV 106.8 (H) 02/09/2023   PLT 274  02/09/2023   Recent Labs    12/23/22 1055 12/27/22  0865 12/30/22 1600 01/13/23 0901 01/24/23 1020 02/08/23 0158 02/09/23 0207 02/10/23 0203  NA 131* 132*   < > 132*   < > 130* 134* 130*  K 4.4 4.4   < > 4.1   < > 4.6 3.8 4.1  CL 95* 95*   < > 95*   < > 97* 102 98  CO2 27 29   < > 28   < > 22 21* 21*  GLUCOSE 111* 116*   < > 134*   < > 160* 119* 89  BUN 23 31*   < > 20   < > 32* 34* 40*  CREATININE 1.12 1.08   < > 1.09   < > 1.13 1.17 1.26*  CALCIUM 9.1 9.0   < > 8.9   < > 8.3* 8.4* 8.2*  GFRNONAA  --   --    < >  --    < > >60 >60 58*  PROT 6.6 6.8   < > 6.1   < > 5.5* 5.2* 5.3*  ALBUMIN 3.6 3.5   < > 3.4*   < > 1.7* 1.7* 1.6*  AST 49* 28   < > 135*   < > 125* 71* 70*  ALT 108* 50   < > 150*   < > 130* 101* 87*  ALKPHOS 303* 362*   < > 694*   < > 284* 217* 193*  BILITOT 0.7 1.0   < > 3.9*   < > 17.0* 15.6* 13.9*  BILIDIR 0.3 0.3  --  2.5*  --   --   --   --    < > = values in this interval not displayed.    RADIOGRAPHIC STUDIES: I have personally reviewed the radiological images as listed and agreed with the findings in the report. IR INT EXT BILIARY DRAIN WITH CHOLANGIOGRAM  Result Date: 02/07/2023 INDICATION: 77 year old gentleman with history of biliary obstruction underwent percutaneous cholecystostomy placement on 01/02/2023 and replacement on 01/24/2023. Continued symptoms of jaundice and hyperbilirubinemia prompteda MRI on 01/24/2023. Enhancement of the ductal system was suspicious for cholangitis. Right hepatic biliary drain was placed on 01/31/2023. 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 Despite placement of the right hepatic drain, hyperbilirubinemia and jaundice has persisted. He returns today for placement of left hepatic biliary drain as well as brush biopsy of the stenosed segment of the right hepatic duct through a pre-existing access. EXAM: 1. Ultrasound and fluoroscopy  guided left hepatic internal external biliary drain placement 2. Fluoroscopy guided exchange of existing right hepatic internal external biliary drain 3. Brush biopsy of common and right hepatic bile ducts 4. Bilateral percutaneous transhepatic cholangiogram MEDICATIONS: Rocephin 2 g IV; The antibiotic was administered within an appropriate time frame prior to the initiation of the procedure. Demerol IV 50 mg ANESTHESIA/SEDATION: Moderate (conscious) sedation was employed during this procedure. A total of Versed 3 mg and Fentanyl 75 mcg was administered intravenously by the radiology nurse. Total intra-service moderate Sedation Time: 56 minutes. The patient's level of consciousness and vital signs were monitored continuously by radiology nursing throughout the procedure under my direct supervision. FLUOROSCOPY: Radiation Exposure Index (as provided by the fluoroscopic device): 98 mGy Kerma COMPLICATIONS: None immediate. PROCEDURE: Informed written consent was obtained from the patient after a thorough discussion of the procedural risks, benefits and alternatives. All questions were addressed. Maximal Sterile Barrier Technique was utilized including caps, mask, sterile gowns,  sterile gloves, sterile drape, hand hygiene and skin antiseptic. A timeout was performed prior to the initiation of the procedure. Patient positioned supine on the procedure table. Epigastric and right upper quadrant abdominal wall skin prepped and draped in the usual sterile fashion. Sonographic evaluation of the left hepatic lobe showed mildly dilated ducts. Following local lidocaine administration, a left hepatic lobe duct was successfully accessed with a 21 gauge needle. 21 gauge needle was removed over 0.018 inch guidewire. The guidewire was advanced to the level of the common bile duct. Contrast administered through the right hepatic drain confirmed appropriate positioning within the right hepatic bile ducts and doudenum. The right-sided  37 French drain was cut and removed over 0.035 inch guidewire and replaced with an 8 [place] sheath. The 8 French sheath was advanced beyond the site of right hepatic and common bile duct stenosis. Two brush biopsies were performed at the stenotic segments of the common and right main hepatic ducts. Both samples were sent to pathology in sterile saline. New 12 French internal external biliary drain was inserted over the 0.035 inch guidewire. The pigtail was formed within the doudenum. Attention was again turned to the left hepatic lobe. Transitional dilator set was inserted over the 0.018 inch guidewire. Kumpe catheter and stiff glidewire were advanced to the level of the doudenum utilizing fluoroscopic guidance through this access. Kumpe the catheter was removed and a 10 [place] internal external biliary drain was inserted. The distal pigtail was formed within the doudenum. Contrast administered through both drains confirmed appropriate positioning within the doudenum as well as within the intrahepatic bile ducts. Both drains were secured to skin with silk suture and connected to bag. IMPRESSION: 1. Successful brush biopsy of the stenotic segments of the common bile and right main bile ducts. 2. Successful replacement of 12 French right hepatic internal external biliary drain. 3. Successful insertion of 10 French left hepatic internal external biliary drain. Electronically Signed   By: Acquanetta Belling M.D.   On: 02/07/2023 16:36   IR ENDOLUMINAL BX OF BILIARY TREE  Result Date: 02/07/2023 INDICATION: 77 year old gentleman with history of biliary obstruction underwent percutaneous cholecystostomy placement on 01/02/2023 and replacement on 01/24/2023. Continued symptoms of jaundice and hyperbilirubinemia prompteda MRI on 01/24/2023. Enhancement of the ductal system was suspicious for cholangitis. Right hepatic biliary drain was placed on 01/31/2023. 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 Despite placement of the right hepatic drain, hyperbilirubinemia and jaundice has persisted. He returns today for placement of left hepatic biliary drain as well as brush biopsy of the stenosed segment of the right hepatic duct through a pre-existing access. EXAM: 1. Ultrasound and fluoroscopy guided left hepatic internal external biliary drain placement 2. Fluoroscopy guided exchange of existing right hepatic internal external biliary drain 3. Brush biopsy of common and right hepatic bile ducts 4. Bilateral percutaneous transhepatic cholangiogram MEDICATIONS: Rocephin 2 g IV; The antibiotic was administered within an appropriate time frame prior to the initiation of the procedure. Demerol IV 50 mg ANESTHESIA/SEDATION: Moderate (conscious) sedation was employed during this procedure. A total of Versed 3 mg and Fentanyl 75 mcg was administered intravenously by the radiology nurse. Total intra-service moderate Sedation Time: 56 minutes. The patient's level of consciousness and vital signs were monitored continuously by radiology nursing throughout the procedure under my direct supervision. FLUOROSCOPY: Radiation Exposure Index (as provided by the fluoroscopic device): 98 mGy Kerma COMPLICATIONS: None immediate. PROCEDURE: Informed  written consent was obtained from the patient after a thorough discussion of the procedural risks, benefits and alternatives. All questions were addressed. Maximal Sterile Barrier Technique was utilized including caps, mask, sterile gowns, sterile gloves, sterile drape, hand hygiene and skin antiseptic. A timeout was performed prior to the initiation of the procedure. Patient positioned supine on the procedure table. Epigastric and right upper quadrant abdominal wall skin prepped and draped in the usual sterile fashion. Sonographic evaluation of the left hepatic lobe showed mildly dilated ducts. Following local lidocaine  administration, a left hepatic lobe duct was successfully accessed with a 21 gauge needle. 21 gauge needle was removed over 0.018 inch guidewire. The guidewire was advanced to the level of the common bile duct. Contrast administered through the right hepatic drain confirmed appropriate positioning within the right hepatic bile ducts and doudenum. The right-sided 46 French drain was cut and removed over 0.035 inch guidewire and replaced with an 8 [place] sheath. The 8 French sheath was advanced beyond the site of right hepatic and common bile duct stenosis. Two brush biopsies were performed at the stenotic segments of the common and right main hepatic ducts. Both samples were sent to pathology in sterile saline. New 12 French internal external biliary drain was inserted over the 0.035 inch guidewire. The pigtail was formed within the doudenum. Attention was again turned to the left hepatic lobe. Transitional dilator set was inserted over the 0.018 inch guidewire. Kumpe catheter and stiff glidewire were advanced to the level of the doudenum utilizing fluoroscopic guidance through this access. Kumpe the catheter was removed and a 10 [place] internal external biliary drain was inserted. The distal pigtail was formed within the doudenum. Contrast administered through both drains confirmed appropriate positioning within the doudenum as well as within the intrahepatic bile ducts. Both drains were secured to skin with silk suture and connected to bag. IMPRESSION: 1. Successful brush biopsy of the stenotic segments of the common bile and right main bile ducts. 2. Successful replacement of 12 French right hepatic internal external biliary drain. 3. Successful insertion of 10 French left hepatic internal external biliary drain. Electronically Signed   By: Acquanetta Belling M.D.   On: 02/07/2023 16:36   IR ENDOLUMINAL BX OF BILIARY TREE  Result Date: 02/07/2023 INDICATION: 77 year old gentleman with history of biliary obstruction  underwent percutaneous cholecystostomy placement on 01/02/2023 and replacement on 01/24/2023. Continued symptoms of jaundice and hyperbilirubinemia prompteda MRI on 01/24/2023. Enhancement of the ductal system was suspicious for cholangitis. Right hepatic biliary drain was placed on 01/31/2023. 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 Despite placement of the right hepatic drain, hyperbilirubinemia and jaundice has persisted. He returns today for placement of left hepatic biliary drain as well as brush biopsy of the stenosed segment of the right hepatic duct through a pre-existing access. EXAM: 1. Ultrasound and fluoroscopy guided left hepatic internal external biliary drain placement 2. Fluoroscopy guided exchange of existing right hepatic internal external biliary drain 3. Brush biopsy of common and right hepatic bile ducts 4. Bilateral percutaneous transhepatic cholangiogram MEDICATIONS: Rocephin 2 g IV; The antibiotic was administered within an appropriate time frame prior to the initiation of the procedure. Demerol IV 50 mg ANESTHESIA/SEDATION: Moderate (conscious) sedation was employed during this procedure. A total of Versed 3 mg and Fentanyl 75 mcg was administered intravenously by the radiology nurse. Total intra-service moderate Sedation Time: 56 minutes. The patient's level of consciousness and  vital signs were monitored continuously by radiology nursing throughout the procedure under my direct supervision. FLUOROSCOPY: Radiation Exposure Index (as provided by the fluoroscopic device): 98 mGy Kerma COMPLICATIONS: None immediate. PROCEDURE: Informed written consent was obtained from the patient after a thorough discussion of the procedural risks, benefits and alternatives. All questions were addressed. Maximal Sterile Barrier Technique was utilized including caps, mask, sterile gowns, sterile gloves, sterile  drape, hand hygiene and skin antiseptic. A timeout was performed prior to the initiation of the procedure. Patient positioned supine on the procedure table. Epigastric and right upper quadrant abdominal wall skin prepped and draped in the usual sterile fashion. Sonographic evaluation of the left hepatic lobe showed mildly dilated ducts. Following local lidocaine administration, a left hepatic lobe duct was successfully accessed with a 21 gauge needle. 21 gauge needle was removed over 0.018 inch guidewire. The guidewire was advanced to the level of the common bile duct. Contrast administered through the right hepatic drain confirmed appropriate positioning within the right hepatic bile ducts and doudenum. The right-sided 43 French drain was cut and removed over 0.035 inch guidewire and replaced with an 8 [place] sheath. The 8 French sheath was advanced beyond the site of right hepatic and common bile duct stenosis. Two brush biopsies were performed at the stenotic segments of the common and right main hepatic ducts. Both samples were sent to pathology in sterile saline. New 12 French internal external biliary drain was inserted over the 0.035 inch guidewire. The pigtail was formed within the doudenum. Attention was again turned to the left hepatic lobe. Transitional dilator set was inserted over the 0.018 inch guidewire. Kumpe catheter and stiff glidewire were advanced to the level of the doudenum utilizing fluoroscopic guidance through this access. Kumpe the catheter was removed and a 10 [place] internal external biliary drain was inserted. The distal pigtail was formed within the doudenum. Contrast administered through both drains confirmed appropriate positioning within the doudenum as well as within the intrahepatic bile ducts. Both drains were secured to skin with silk suture and connected to bag. IMPRESSION: 1. Successful brush biopsy of the stenotic segments of the common bile and right main bile ducts. 2.  Successful replacement of 12 French right hepatic internal external biliary drain. 3. Successful insertion of 10 French left hepatic internal external biliary drain. Electronically Signed   By: Acquanetta Belling M.D.   On: 02/07/2023 16:36   IR EXCHANGE BILIARY DRAIN  Result Date: 02/07/2023 INDICATION: 77 year old gentleman with history of biliary obstruction underwent percutaneous cholecystostomy placement on 01/02/2023 and replacement on 01/24/2023. Continued symptoms of jaundice and hyperbilirubinemia prompteda MRI on 01/24/2023. Enhancement of the ductal system was suspicious for cholangitis. Right hepatic biliary drain was placed on 01/31/2023. 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 Despite placement of the right hepatic drain, hyperbilirubinemia and jaundice has persisted. He returns today for placement of left hepatic biliary drain as well as brush biopsy of the stenosed segment of the right hepatic duct through a pre-existing access. EXAM: 1. Ultrasound and fluoroscopy guided left hepatic internal external biliary drain placement 2. Fluoroscopy guided exchange of existing right hepatic internal external biliary drain 3. Brush biopsy of common and right hepatic bile ducts 4. Bilateral percutaneous transhepatic cholangiogram MEDICATIONS: Rocephin 2 g IV; The antibiotic was administered within an appropriate time frame prior to the initiation of the procedure. Demerol IV 50 mg ANESTHESIA/SEDATION: Moderate (conscious) sedation was employed during this  procedure. A total of Versed 3 mg and Fentanyl 75 mcg was administered intravenously by the radiology nurse. Total intra-service moderate Sedation Time: 56 minutes. The patient's level of consciousness and vital signs were monitored continuously by radiology nursing throughout the procedure under my direct supervision. FLUOROSCOPY: Radiation Exposure Index (as provided  by the fluoroscopic device): 98 mGy Kerma COMPLICATIONS: None immediate. PROCEDURE: Informed written consent was obtained from the patient after a thorough discussion of the procedural risks, benefits and alternatives. All questions were addressed. Maximal Sterile Barrier Technique was utilized including caps, mask, sterile gowns, sterile gloves, sterile drape, hand hygiene and skin antiseptic. A timeout was performed prior to the initiation of the procedure. Patient positioned supine on the procedure table. Epigastric and right upper quadrant abdominal wall skin prepped and draped in the usual sterile fashion. Sonographic evaluation of the left hepatic lobe showed mildly dilated ducts. Following local lidocaine administration, a left hepatic lobe duct was successfully accessed with a 21 gauge needle. 21 gauge needle was removed over 0.018 inch guidewire. The guidewire was advanced to the level of the common bile duct. Contrast administered through the right hepatic drain confirmed appropriate positioning within the right hepatic bile ducts and doudenum. The right-sided 102 French drain was cut and removed over 0.035 inch guidewire and replaced with an 8 [place] sheath. The 8 French sheath was advanced beyond the site of right hepatic and common bile duct stenosis. Two brush biopsies were performed at the stenotic segments of the common and right main hepatic ducts. Both samples were sent to pathology in sterile saline. New 12 French internal external biliary drain was inserted over the 0.035 inch guidewire. The pigtail was formed within the doudenum. Attention was again turned to the left hepatic lobe. Transitional dilator set was inserted over the 0.018 inch guidewire. Kumpe catheter and stiff glidewire were advanced to the level of the doudenum utilizing fluoroscopic guidance through this access. Kumpe the catheter was removed and a 10 [place] internal external biliary drain was inserted. The distal pigtail was  formed within the doudenum. Contrast administered through both drains confirmed appropriate positioning within the doudenum as well as within the intrahepatic bile ducts. Both drains were secured to skin with silk suture and connected to bag. IMPRESSION: 1. Successful brush biopsy of the stenotic segments of the common bile and right main bile ducts. 2. Successful replacement of 12 French right hepatic internal external biliary drain. 3. Successful insertion of 10 French left hepatic internal external biliary drain. Electronically Signed   By: Acquanetta Belling M.D.   On: 02/07/2023 16:36   CT ABDOMEN W CONTRAST  Result Date: 02/07/2023 CLINICAL DATA:  78 year old male with a history of biliary obstruction. The original ultrasound 12/26/2022 demonstrates evidence of acute cholecystitis, which was present on a follow-up MRI 12/29/2022. Following this a percutaneous cholecystostomy was placed 01/02/2023 and replaced 01/24/2023. Ongoing symptoms of jaundice and hyperbilirubinemia prompted a second MRI 01/24/2023. MR suggested cholangitis given some enhancement along the ductal system, undrained intrahepatic bile ducts. Internal external biliary drain was placed 01/31/2023. 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 A follow-up CT 01/31/2023 and 02/02/2023 performed, with no radiopaque gallstones accounting for biliary obstruction. The patient has had ongoing hyperbilirubinemia which has plateaued at the level of the proximally 14 and remains symptomatic. He presents for this CT study as a planning study preoperatively 4 left-sided biliary drainage.  EXAM: CT ABDOMEN WITH CONTRAST TECHNIQUE: Multidetector CT imaging of the abdomen was performed using the standard protocol following bolus administration of intravenous contrast. RADIATION DOSE REDUCTION: This exam was  performed according to the departmental dose-optimization program which includes automated exposure control, adjustment of the mA and/or kV according to patient size and/or use of iterative reconstruction technique. CONTRAST:  75mL OMNIPAQUE IOHEXOL 350 MG/ML SOLN COMPARISON:  Ultrasound 12/26/2022, MR 12/29/2022, per coli 01/02/2023, MR 01/24/2023, CT 01/31/2023, CT 02/02/2023 PTC and drainage 01/31/2023 FINDINGS: Lower chest: No acute finding of the lower chest. Minimal atelectasis and trace fluid. Hepatobiliary: Left-sided intrahepatic biliary ductal dilatation. There is associated periportal edema. There are a few small mildly dilated intrahepatic ducts of the right liver, particularly of the segment 8. Percutaneous transhepatic internal/external biliary drain via right-sided approach remains adequately position with the radiopaque marker in the bile ducts and the term in a shin of the drain in the small bowel. Percutaneous cholecystostomy tube in place within the fundus of the gallbladder. No radiopaque stones. There are no radiopaque stones identified along the course of the internal/external biliary drain. Region of hypodensity/hypoenhancement at the confluence of the left and right biliary ducts, at the liver hilum on image 19 of series 3. This region measures 13 mm x 42 mm on the axial images. No cirrhotic changes Pancreas: Attenuation/enhancement of the pancreas within normal limits. Mild ductal dilatation of the pancreatic duct. Spleen: Unremarkable Adrenals/Urinary Tract: - Right adrenal gland:  Unremarkable - Left adrenal gland: Unremarkable. - Right kidney: No hydronephrosis, nephrolithiasis, inflammation, or ureteral dilation. No focal lesion. - Left Kidney: No hydronephrosis, nephrolithiasis, inflammation, or ureteral dilation. No focal lesion. Stomach/Bowel: - Stomach: Small hiatal hernia.  Otherwise unremarkable stomach. - Small bowel: Duodenal diverticulum, which was identified on prior ERCP  attempt. Internal/external biliary drain terminates in the duodenum. Visualized small bowel and colon unremarkable with no distension. Vascular/Lymphatic: Calcifications of the abdominal aorta. Mesenteric arteries and renal arteries patent. Other: None Musculoskeletal: Unchanged configuration of the visualized vertebral bodies including compression fracture of L1 favored to be chronic. No bony canal narrowing. IMPRESSION: Right-sided internal/external biliary drain in adequate position across the right-sided biliary ducts and into the duodenum. The majority of the right-sided ductal system is decompressed, with some mild residual ductal dilatation involving segment 8. Persistent left-sided intrahepatic biliary ductal dilatation. Nonspecific focus of low attenuation/hypoenhancement at the confluence of the left and right-sided biliary ducts in the liver hilum. This may reflect focal edema, however, a Klatskin tumor cannot be excluded as etiology for the ductal dilatation. Percutaneous cholecystostomy in position in the gallbladder. Aortic Atherosclerosis (ICD10-I70.0). Electronically Signed   By: Samuel Mayo D.O.   On: 02/07/2023 11:21   DG CHEST PORT 1 VIEW  Result Date: 02/02/2023 CLINICAL DATA:  Fever EXAM: PORTABLE CHEST 1 VIEW COMPARISON:  12/26/2022 x-ray FINDINGS: Status post median sternotomy. Enlarged cardiopericardial silhouette with calcified tortuous aorta. Prosthetic valve. Overlapping cardiac leads. There is some linear opacity at the bases likely scar or atelectasis. No pneumothorax, effusion or edema. Healed right eighth rib fracture. IMPRESSION: Postop chest with enlarged heart and calcified aorta. Basilar atelectasis Electronically Signed   By: Samuel Mayo M.D.   On: 02/02/2023 16:08   CT ABDOMEN PELVIS WO CONTRAST  Result Date: 02/02/2023 CLINICAL DATA:  Anemia. EXAM: CT ABDOMEN AND PELVIS WITHOUT CONTRAST TECHNIQUE: Multidetector CT imaging of the abdomen and pelvis was performed  following the standard protocol without IV contrast. RADIATION DOSE REDUCTION: This exam was performed according to the departmental  dose-optimization program which includes automated exposure control, adjustment of the mA and/or kV according to patient size and/or use of iterative reconstruction technique. COMPARISON:  January 31, 2023. FINDINGS: Lower chest: Minimal bibasilar subsegmental atelectasis is noted. Hepatobiliary: Percutaneous cholecystostomy tube is again noted with decompression of the bladder. Stable position of internal external biliary drain with distal tip in duodenum. Stable mild left hepatic biliary dilatation with periportal edema. Pancreas: Unremarkable. No pancreatic ductal dilatation or surrounding inflammatory changes. Spleen: Normal in size without focal abnormality. Adrenals/Urinary Tract: Adrenal glands are unremarkable. Kidneys are normal, without renal calculi, focal lesion, or hydronephrosis. Bladder is unremarkable. Stomach/Bowel: Stomach is within normal limits. Appendix appears normal. No evidence of bowel wall thickening, distention, or inflammatory changes. Vascular/Lymphatic: Aortic atherosclerosis. No enlarged abdominal or pelvic lymph nodes. Reproductive: Mild prostatic enlargement is noted. Other: No abdominal wall hernia or abnormality. No abdominopelvic ascites. Musculoskeletal: Old L1 and L5 fractures are again noted. No acute abnormality seen. IMPRESSION: Percutaneous cholecystostomy tube is again noted. Stable position of internal external biliary drain with distal tip in duodenum. Stable mild left hepatic biliary dilatation with periportal edema. Electronically Signed   By: Samuel Mayo M.D.   On: 02/02/2023 13:43   CT ABDOMEN PELVIS W CONTRAST  Result Date: 01/31/2023 CLINICAL DATA:  Gallbladder and biliary cancer. EXAM: CT ABDOMEN AND PELVIS WITH CONTRAST TECHNIQUE: Multidetector CT imaging of the abdomen and pelvis was performed using the standard protocol  following bolus administration of intravenous contrast. RADIATION DOSE REDUCTION: This exam was performed according to the departmental dose-optimization program which includes automated exposure control, adjustment of the mA and/or kV according to patient size and/or use of iterative reconstruction technique. CONTRAST:  75mL OMNIPAQUE IOHEXOL 350 MG/ML SOLN COMPARISON:  MRI abdomen 01/24/2023. FINDINGS: Lower chest: There is atelectasis in the lung bases with trace bilateral pleural effusions. Hepatobiliary: Transhepatic percutaneous biliary catheter present with distal tip in the duodenal. There is mild intrahepatic biliary ductal dilatation in the left lobe of the liver with some periportal edema similar to prior MRI given differences in technique. There is a small amount of pneumobilia and contrast throughout the biliary tree likely related to recent procedure. No focal liver lesions are seen. Percutaneous cholecystostomy tube present. There some contrast in the gallbladder. The gallbladder is decompressed and there is diffuse wall thickening similar to prior. Pancreas: Unremarkable. No pancreatic ductal dilatation or surrounding inflammatory changes. Spleen: Normal in size without focal abnormality. Adrenals/Urinary Tract: Adrenal glands are unremarkable. Kidneys are normal, without renal calculi, focal lesion, or hydronephrosis. Bladder is unremarkable. Stomach/Bowel: Stomach is within normal limits. Appendix appears normal. No evidence of bowel wall thickening, distention, or inflammatory changes. There is sigmoid colon diverticulosis. Vascular/Lymphatic: No significant vascular findings are present. No enlarged abdominal or pelvic lymph nodes. Reproductive: Prostate gland is enlarged. Other: There is presacral edema. There is trace free fluid in the right upper quadrant similar to prior. There is no focal abdominal wall hernia. Musculoskeletal: There are compression deformities of T8, T9, T11, L1 and L5  which are favored as chronic, but age indeterminate. The bones are diffusely osteopenic. There is mild body wall edema. IMPRESSION: 1. Transhepatic percutaneous biliary catheter present with distal tip in the duodenum. 2. Percutaneous cholecystostomy tube present. Gallbladder is decompressed with diffuse wall thickening similar to prior. 3. Mild intrahepatic biliary ductal dilatation in the left lobe of the liver with periportal edema similar to prior MRI. 4. Trace free fluid in the right upper quadrant similar to prior. 5. Trace bilateral  pleural effusions. 6. Mild body wall edema. 7. Multiple compression deformities of the thoracic and lumbar spine are favored as chronic, but age indeterminate. Correlate clinically. Electronically Signed   By: Samuel Mayo M.D.   On: 01/31/2023 20:37   IR BILIARY DRAIN PLACEMENT WITH CHOLANGIOGRAM  Result Date: 01/31/2023 INDICATION: 77 year old male with biliary obstruction, presents for percutaneous transhepatic cholangiogram and internal/external drain placement EXAM: IMAGE GUIDED PERCUTANEOUS TRANSHEPATIC CHOLANGIOGRAM INTERNAL/EXTERNAL BILIARY DRAIN PLACEMENT MEDICATIONS: 2 g Mefoxin; The antibiotic was administered within an appropriate time frame prior to the initiation of the procedure. ANESTHESIA/SEDATION: Moderate (conscious) sedation was employed during this procedure. A total of Versed 2 mg and Fentanyl 150 mcg was administered intravenously by the radiology nurse. Total intra-service moderate Sedation Time: 43 minutes. The patient's level of consciousness and vital signs were monitored continuously by radiology nursing throughout the procedure under my direct supervision. FLUOROSCOPY: Radiation Exposure Index (as provided by the fluoroscopic device): 282 mGy Kerma COMPLICATIONS: None PROCEDURE: The procedure, risks, benefits, and alternatives were explained to the patient and the patient's family. A complete informed consent was performed, with risk benefit  analysis. Specific risks that were discussed for the procedure include bleeding, infection, biliary sepsis, IC use day, organ injury, need for further procedure, need for further surgery, long-term drain placement, cardiopulmonary collapse, death. Questions regarding the procedure were encouraged and answered. The patient understands and consents to the procedure. Patient is position in supine position on the fluoroscopy table, and the upper abdomen was prepped and draped in the usual sterile fashion. Maximum barrier sterile technique with sterile gowns and gloves were used for the procedure. A timeout was performed prior to the initiation of the procedure. Local anesthesia was provided with 1% lidocaine with epinephrine. Ultrasound survey of the left liver lobe was performed, with then ultrasound of the right liver lobe. We elected to access transhepatic biliary system via the right. 1% lidocaine was used for local anesthesia, with generous infiltration of the skin and subcutaneous tissues in and inter left costal location. A Chiba needle was advanced under ultrasound guidance into the right liver lobe, targeting biliary system. Once the tip of the needle was confirmed within the biliary system by injecting small aliquots of contrast, images were stored of the biliary system after partially opacifying the biliary tree via the needle. An 018 wire was then advanced centrally. The needle was removed, a small incision was made with an 11 blade scalpel, and then a triaxial Bard system was advanced into the biliary system. The metal stiffener and dilator were removed, we confirmed placement with contrast infusion. We then attempted to navigate through stenotic ductal system at the hilum, presumably the common hepatic duct. A coaxial Glidewire and 4 French glide cath were then used to attempt to navigate across the obstruction at the hilum of the liver. A combination of the glide cath, Glidewire, and a roadrunner wire  were required to access the common hepatic duct. Once the catheter and wire were advanced into the external hepatic ducts, further images were acquired. This demonstrated that the duodenal diverticulum was distorting the anatomy at the ampulla. After some initial attempts Mayo to cross the ampulla, a coon's wire was placed and then a 6 French 35 cm straight sheath was placed into the common bile duct. At this point we were able to navigate the angled diagnostic catheter and the Glidewire through the ampulla into the duodenum. With the diagnostic catheter in the second portion the duodenum, the coon's wire was then placed.  Twelve French dilation of the subcutaneous tissue tracks was performed, and then a 78 [place] biliary drain was placed as an internal/external biliary drain. Small amount of contrast confirmed location. The patient tolerated the procedure well and remained hemodynamically stable throughout. No complications were encountered and no significant blood loss was encountered. FINDINGS: Percutaneous transhepatic cholangiogram demonstrates significant stenotic narrowing in the bile ducts at the hilum of the liver, suspicious for malignancy/Klatskin tumor IMPRESSION: Status post image guided percutaneous transhepatic cholangiogram with internal/external biliary drain placement. Injection of the percutaneous cholecystostomy tube demonstrates that the cystic duct remains occluded. Signed, Samuel Mayo. Miachel Roux, RPVI [facility] Radiology Electronically Signed   By: Samuel Mayo D.O.   On: 01/31/2023 16:46   DG C-Arm 1-60 Min-No Report  Result Date: 01/29/2023 Fluoroscopy was utilized by the requesting physician.  No radiographic interpretation.   MR ABDOMEN MRCP W WO CONTAST  Result Date: 01/24/2023 CLINICAL DATA:  Jaundice. History of acute cholecystitis status post percutaneous cholecystostomy tube placement 01/02/2023 EXAM: MRI ABDOMEN WITHOUT  AND WITH CONTRAST (INCLUDING MRCP) TECHNIQUE: Multiplanar multisequence MR imaging of the abdomen was performed both before and after the administration of intravenous contrast. Heavily T2-weighted images of the biliary and pancreatic ducts were obtained. Post-processing was applied at the acquisition scanner with concurrent physician supervision which includes 3D reconstructions, MIPs, volume rendered images and/or shaded surface rendering. CONTRAST:  8.36mL GADAVIST GADOBUTROL 1 MMOL/ML IV SOLN COMPARISON:  MR abdomen dated 12/29/2022 FINDINGS: Decreased sensitivity and specificity for detailed findings due to motion artifact. Lower chest: Trace bilateral pleural effusions. Hepatobiliary: Parenchymal signal abnormality. Scattered subcentimeter T2 hyperintense foci, likely cysts. Increased mild to moderate intrahepatic bile duct dilation more prominent in the left hepatic lobe with new mural thickening of the extrahepatic bile duct (4:16) and enhancement (1304:45). The common bile duct is nondilated. Gallbladder is decompressed with catheter in-situ and demonstrates circumferential mural thickening. Pancreas: No mass or inflammatory changes. 3 mm T2 hyperintense cystic focus arises directly from the main pancreatic duct in the pancreatic neck (6:20) keeping with side branch intraductal papillary mucinous neoplasms (IPMN). No main ductal dilation, mass lesion, or abnormal enhancement. Spleen:  Within normal limits in size and appearance. Adrenals/Urinary Tract: No adrenal nodules. No suspicious renal masses identified. No evidence of hydronephrosis. Stomach/Bowel: Small proximal duodenal diverticulum. Colonic diverticulosis without acute diverticulitis. Vascular/Lymphatic: No pathologically enlarged lymph nodes identified. No abdominal aortic aneurysm demonstrated. Other:  Trace ascites. Musculoskeletal: No suspicious bone lesions identified. Susceptibility artifact related to median sternotomy wires. IMPRESSION: 1.  Increased mild to moderate intrahepatic bile duct dilation, more prominent in the left hepatic lobe, with new mural thickening and enhancement of the extrahepatic bile duct, suspicious for cholangitis. 2. Decompressed gallbladder with catheter in-situ and circumferential mural thickening. 3. Trace bilateral pleural effusions and trace ascites. Electronically Signed   By: Samuel Mayo M.D.   On: 01/24/2023 15:54   MR 3D Recon At Scanner  Result Date: 01/24/2023 CLINICAL DATA:  Jaundice. History of acute cholecystitis status post percutaneous cholecystostomy tube placement 01/02/2023 EXAM: MRI ABDOMEN WITHOUT AND WITH CONTRAST (INCLUDING MRCP) TECHNIQUE: Multiplanar multisequence MR imaging of the abdomen was performed both before and after the administration of intravenous contrast. Heavily T2-weighted images of the biliary and pancreatic ducts were obtained. Post-processing was applied at the acquisition scanner with concurrent physician supervision which includes 3D reconstructions, MIPs, volume rendered images and/or shaded surface rendering. CONTRAST:  8.34mL GADAVIST GADOBUTROL 1 MMOL/ML IV SOLN COMPARISON:  MR abdomen dated 12/29/2022  FINDINGS: Decreased sensitivity and specificity for detailed findings due to motion artifact. Lower chest: Trace bilateral pleural effusions. Hepatobiliary: Parenchymal signal abnormality. Scattered subcentimeter T2 hyperintense foci, likely cysts. Increased mild to moderate intrahepatic bile duct dilation more prominent in the left hepatic lobe with new mural thickening of the extrahepatic bile duct (4:16) and enhancement (1304:45). The common bile duct is nondilated. Gallbladder is decompressed with catheter in-situ and demonstrates circumferential mural thickening. Pancreas: No mass or inflammatory changes. 3 mm T2 hyperintense cystic focus arises directly from the main pancreatic duct in the pancreatic neck (6:20) keeping with side branch intraductal papillary mucinous  neoplasms (IPMN). No main ductal dilation, mass lesion, or abnormal enhancement. Spleen:  Within normal limits in size and appearance. Adrenals/Urinary Tract: No adrenal nodules. No suspicious renal masses identified. No evidence of hydronephrosis. Stomach/Bowel: Small proximal duodenal diverticulum. Colonic diverticulosis without acute diverticulitis. Vascular/Lymphatic: No pathologically enlarged lymph nodes identified. No abdominal aortic aneurysm demonstrated. Other:  Trace ascites. Musculoskeletal: No suspicious bone lesions identified. Susceptibility artifact related to median sternotomy wires. IMPRESSION: 1. Increased mild to moderate intrahepatic bile duct dilation, more prominent in the left hepatic lobe, with new mural thickening and enhancement of the extrahepatic bile duct, suspicious for cholangitis. 2. Decompressed gallbladder with catheter in-situ and circumferential mural thickening. 3. Trace bilateral pleural effusions and trace ascites. Electronically Signed   By: Samuel Mayo M.D.   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 Patient now with worsening hyperbilirubinemia and jaundice and as such presents for cholangiogram and potential cholecystostomy tube exchange. EXAM: FLUOROSCOPIC GUIDED CHOLECYSTOSTOMY TUBE EXCHANGE COMPARISON:  MRCP-12/29/2022 Image guided cholecystostomy tube placement-01/02/2023 MEDICATIONS: None ANESTHESIA/SEDATION: None CONTRAST:  15mL OMNIPAQUE IOHEXOL 300 MG/ML SOLN - administered into the gallbladder lumen. FLUOROSCOPY TIME:  1 minute, 6 seconds (6.1 mGy) COMPLICATIONS: None immediate. PROCEDURE: The patient was positioned supine on the fluoroscopy table. The external portion of the existing cholecystostomy tube as well as the surrounding skin was prepped and draped in usual sterile  fashion. A time-out was performed prior to the initiation of the procedure. A preprocedural spot fluoroscopic image was obtained of the right upper abdominal quadrant existing cholecystostomy tube. The skin surrounding the cholecystostomy tube was anesthetized with 1% lidocaine with epinephrine. The external portion of the cholecystostomy tube was cut and cannulated with a short Amplatz wire which was advanced through the tube and coiled within the gallbladder lumen Next, under intermittent fluoroscopic guidance, the existing 10 [place] cholecystostomy tube was exchanged for a new, slightly larger now 10 [place] cholecystostomy tube which was repositioned into the more central aspect of the gallbladder lumen. Contrast injection confirms appropriate positioning and functionality of the cholecystomy tube. The cholecystostomy tube was flushed with a small amount of saline and reconnected to a gravity bag. The cholecystostomy tube was secured with an interrupted suture and a Stat Lock device. A dressing was applied. The patient tolerated the procedure well without immediate postprocedural complication. FINDINGS: Preprocedural spot fluoroscopic image demonstrates unchanged positioning of cholecystostomy tube with end coiled and locked over the expected location of the fundus of the gallbladder. Contrast injection demonstrates appropriate positioning and functionality of the existing cholecystostomy tube. There is opacification of the peripheral aspect of the cystic duct without passage of contrast to the level of the CBD. After fluoroscopic guided exchange, the new, 10 [place] cholecystostomy tube is appropriately positioned within the gallbladder lumen.  Post exchange cholangiogram demonstrates appropriate positioning and functionality of the new cholecystostomy tube. IMPRESSION: Successful fluoroscopic guided exchange of 10 [place] cholecystostomy tube. PLAN: - Cholangiogram demonstrates persistent occlusion of the cystic  duct without opacification of the CBD. 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 The patient is visibly jaundiced with worsening hyperbilirubinemia (bilirubin - 3.9 on 11/11, 0.6 on the day of cholecystostomy tube placement) and as such the decision was made to escort the patient to the emergency department for further evaluation and management as the patient will likely require an ERCP to relieve his worsening biliary obstruction. Electronically Signed   By: Samuel Mayo M.D.   On: 01/24/2023 11:06     The total time spent in the appointment was 40 minutes encounter with patients including review of chart and various tests results, discussions about plan of care and coordination of care plan   All questions were answered. The patient knows to call the clinic with any problems, questions or concerns. No barriers to learning was detected.  Dietrich Pates Rouson, NP 12/9/202412:30 PM  Addendum I have seen the patient, examined him. I agree with the assessment and and plan and have edited the notes.   77 yo male with PMH of HTN, AF, seizure, MVR status post intercurrently, presented with painless jaundice.  I have reviewed his complicated hospital course, lab, and imaging myself, and discussed the findings with patient's.  He now status post 3 biliary drainage  in gallbladder, left in the right biliary ducts, cytology from brushing of right the main bile duct from February 07, 2023 is still pending.  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  Tissue diagnosis is strongly recommended before surgical referral. Due to his advanced age, poor PS, severe malnutrition, current bacteremia, he is a very poor candidate for surgery in the near future.  Patient has not had a CT chest, which I think is reasonable to do to rule out metastasis.  Tumor marker CA 19.9 has been significantly elevated in  800-900 range, but it is less meaningful in the setting of obstructive jaundice. If cytology negative, will ask IR to repeat biliary duct brushing, other alternative is do PET after hospital discharge.  Patient is understandably very frustrated, all questions were answered.  I spent a total of 60 minutes for his visit today, more than 50% time on face-to-face counseling.  I left him my phone number to patient, in case his wife has further questions.  Samuel Mood MD 02/10/2023

## 2023-02-10 NOTE — TOC Progression Note (Signed)
Transition of Care [facility]) - Progression Note    Patient Details  Name: Martae Samuelsen MRN: [id number] Date of Birth: 1945-11-08  Transition of Care [facility]) CM/SW Contact  Marliss Coots, LCSW Phone Number: 02/10/2023, 9:45 AM  Clinical Narrative:     9:45 AM CSW called admissions at [facility] to follow up on fl2. Admissions confirmed they received fl2 and would provide a deicison. CSW contacted [place] with admissions at [facility], who stated that DON would review patient's referral today.  Expected Discharge Plan: Skilled Nursing Facility Barriers to Discharge: English as a second language teacher, Continued Medical Work up, SNF Pending bed offer  Expected Discharge Plan and Services In-house Referral: Clinical Social Work Discharge Planning Services: CM Consult Post Acute Care Choice: Skilled Nursing Facility Living arrangements for the past 2 months: Single Family Home                                       Social Determinants of Health (SDOH) Interventions SDOH Screenings   Food Insecurity: No Food Insecurity (01/25/2023)  Housing: Patient Declined (01/25/2023)  Transportation Needs: No Transportation Needs (01/25/2023)  Utilities: Not At Risk (01/25/2023)  Alcohol Screen: Low Risk  (10/21/2022)  Depression (PHQ2-9): High Risk (01/13/2023)  Financial Resource Strain: Low Risk  (10/21/2022)  Physical Activity: Insufficiently Active (10/21/2022)  Social Connections: Moderately Integrated (10/21/2022)  Stress: Stress Concern Present (10/21/2022)  Tobacco Use: Low Risk  (01/31/2023)    Readmission Risk Interventions     No data to display

## 2023-02-11 DIAGNOSIS — K831 Obstruction of bile duct: Secondary | ICD-10-CM | POA: Diagnosis not present

## 2023-02-11 LAB — COMPREHENSIVE METABOLIC PANEL
ALT: 81 U/L — ABNORMAL HIGH (ref 0–44)
AST: 74 U/L — ABNORMAL HIGH (ref 15–41)
Albumin: <1.5 g/dL — ABNORMAL LOW (ref 3.5–5.0)
Alkaline Phosphatase: 187 U/L — ABNORMAL HIGH (ref 38–126)
Anion gap: 12 (ref 5–15)
BUN: 35 mg/dL — ABNORMAL HIGH (ref 8–23)
CO2: 22 mmol/L (ref 22–32)
Calcium: 8.1 mg/dL — ABNORMAL LOW (ref 8.9–10.3)
Chloride: 96 mmol/L — ABNORMAL LOW (ref 98–111)
Creatinine, Ser: 0.99 mg/dL (ref 0.61–1.24)
GFR, Estimated: >60 mL/min (ref >60)
Glucose, Bld: 86 mg/dL (ref 70–99)
Potassium: 3.6 mmol/L (ref 3.5–5.1)
Sodium: 130 mmol/L — ABNORMAL LOW (ref 135–145)
Total Bilirubin: 12.5 mg/dL — ABNORMAL HIGH (ref <1.2)
Total Protein: 4.7 g/dL — ABNORMAL LOW (ref 6.5–8.1)

## 2023-02-11 LAB — CBC
HCT: 23.8 % — ABNORMAL LOW (ref 39.0–52.0)
Hemoglobin: 8.1 g/dL — ABNORMAL LOW (ref 13.0–17.0)
MCH: 35.2 pg — ABNORMAL HIGH (ref 26.0–34.0)
MCHC: 34 g/dL (ref 30.0–36.0)
MCV: 103.5 fL — ABNORMAL HIGH (ref 80.0–100.0)
Platelets: 328 10*3/uL (ref 150–400)
RBC: 2.3 MIL/uL — ABNORMAL LOW (ref 4.22–5.81)
RDW: 15.2 % (ref 11.5–15.5)
WBC: 8.9 10*3/uL (ref 4.0–10.5)
nRBC: 0 % (ref 0.0–0.2)

## 2023-02-11 LAB — PHOSPHORUS: Phosphorus: 4.3 mg/dL (ref 2.5–4.6)

## 2023-02-11 LAB — MAGNESIUM: Magnesium: 2.2 mg/dL (ref 1.7–2.4)

## 2023-02-11 MED ORDER — PROSOURCE PLUS PO LIQD
30.0000 mL | Freq: Two times a day (BID) | ORAL | Status: DC
  Administered 2023-02-11 – 2023-02-24 (×25): 30 mL via ORAL
  Filled 2023-02-11 (×25): qty 30

## 2023-02-11 NOTE — Progress Notes (Signed)
Mobility Specialist Progress Note:    02/11/23 1109  Therapy Vitals  Temp 98.3 F (36.8 C)  Temp Source Oral  Pulse Rate 98  Resp 19  BP 100/79  Patient Position (if appropriate) Sitting  Oxygen Therapy  SpO2 98 %  O2 Device Room Air  Mobility  Activity Ambulated with assistance to bathroom  Level of Assistance Minimal assist, patient does 75% or more  Assistive Device Front wheel walker  Distance Ambulated (ft) 20 ft  Activity Response Tolerated fair  Mobility Referral Yes  Mobility visit 1 Mobility  Mobility Specialist Start Time (ACUTE ONLY) 1043  Mobility Specialist Stop Time (ACUTE ONLY) 1054  Mobility Specialist Time Calculation (min) (ACUTE ONLY) 11 min   Pt received in bed, requesting assistance to BR. Required minA to stand and ambulate to BR. Had some bowel incontinence while walking to BR. Pt left in BR and NT aware.  D'Vante Earlene Plater Mobility Specialist Please contact via Special educational needs teacher or Rehab office at [phone]

## 2023-02-11 NOTE — Discharge Instructions (Signed)

## 2023-02-11 NOTE — Progress Notes (Signed)
PROGRESS NOTE    Samuel Mayo  FTD:[id number] DOB: 1945/06/13 DOA: 01/24/2023 PCP: [name], [name], MD   Brief Narrative:  This 77 year old male with history of hypertension, atrial fibrillation, seizures, gout, MVR s/p angioplasty who presented with worsening jaundice.  Patient was recently admitted from 10/28 -11/1 for cholecystitis, diagnosed with Mirizzi syndrome ,underwent cholecystostomy tube.  Follow-up imaging showed persistent occlusion of the cystic duct, elevated liver enzymes so recommended to go to the emergency department.  Patient also  reported decreased appetite, weakness.  On presentation, he was hemodynamically stable.  Lab work showed sodium of 127, lipase of 1228, elevated liver enzymes, bilirubin of 20.  General surgery, GI consulted.  Attempted  ERCP but turned out to be unsuccessful.  IR did PTC with biliary drain placement on 11/29.  On 12/1, Patient became febrile and was hypotensive, tachycardic.  Underlying sepsis suspected.  Critical care , palliaitive care consulted.  Now clinically improving.  IR placed  left-sided biliary drain on 12/6. Plan is for SNF Placement.   12/8: Vital stable, Blood cultures from 12/6 growing Pseudomonas aeruginosa.  CA 19-9 was significantly elevated at 939, AFP normal. Oncology was consulted at patient's request. 12/9: Reports significant improvement.  Pain is improved.  Bilirubin is trending down. 12/10: He reports feeling much improved.  Bilirubin is trending down further.   Assessment & Plan:   Principal Problem:   Obstructive jaundice Active Problems:   Atrial fibrillation, chronic (HCC)   Hypertension   Seizure (HCC)   Cholecystitis   Acute biliary pancreatitis   Malnutrition of moderate degree   Hyperbilirubinemia   DNR (do not resuscitate)   DNR (do not resuscitate) discussion   Palliative care by specialist   Weakness generalized  Obstructive jaundice:  Initially admitted on 10/28 when he presented with  jaundice.  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  Underwent percutaneous cholecystomy tube placement on 10/31 and was discharged home with oral antibiotics.  Outpatient labs showed persistently elevated liver enzymes. Drain study on 11/22 by IR showed persistent occlusion of cystic duct and no opacification of CBD.  MRCP showed dilated intrahepatic bile duct with thickening of the extrahepatic bile duct, suspicious for cholangitis.  Continues to have severe elevated bilirubin, alkaline phosphatase, hyponatremia.  General surgery, GI consulted.  Currently on Unasyn. Denies abdomen pain, nausea or vomiting today.  Remains intensely jaundiced. Attempted  ERCP on 11/27  but turned out to be unsuccessful.  IR did PTC with biliary drain placement on 11/29.  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     He will follow-up with Dr. Allen(hepatobiliary surgery) as an  outpatient at [facility].  Bilirubin  plateaued in the range of 14. IR placed left-sided biliary drain on 12/6.   Follow-up cytology sent from biliary brushings. -Oncology was consulted at patient's request today- Appreciated Dr. Mosetta Putt consultation.   Sepsis sec.to cholangitis:  Became febrile on the night of 11/30, developed leukocytosis.   CT abdomen/pelvis ordered without finding of any acute problem.  Culture ordered, no growth till date.   No leukocytosis.  Antibiotics changed to Augmentin but after the left-sided biliary drain placement on 12/6 , he developed rigors, fever, developed leukocytosis.  Ordered  blood cultures again, restarted Zosyn.   Now Improving leukocytosis.  Blood cultures from 12/6 growing Pseudomonas aeruginosa. Continued on IV Zosyn. ID has recommended to change to Levaquin.  If discharged can be discharged on oral Levaquin ( Total 10 days ).  Hyponatremia:  Baseline sodium runs low in the range of 120-132.  Sodium dropped to the range of 122, prompting nephrology  consult.   Started on urea, Lasix now stopped.  Likely SIADH.   Lasix discontinued Sodium slowly improving and now at 134 >130.   AKI: Likely hemodynamically mediated, patient was hypotensive .  Now resolved.  Renal functions back to normal.   Hypertension: On amlodipine, Toprol at home.  Currently on hold due to hypotension.  Continue midodrine   Paroxysmal A-fib: Metoprolol is on hold because of soft blood pressure.  Heart rate controlled, remains in normal sinus rhythm.  Continue Eliquis.  Seizure disorder: On Depakote.  Elevated liver enzymes is not from Depakote but from obstructive liver disease. Continue to trend  CMP.   History of mitral valve regurgitation: Status post annuloplasty.  Follow-up with cardiology as an outpatient.   Goals of care: Palliative care, critical care consulted.  Patient might have underlying biliary tumor which looks like unresectable as per general surgery.  Poor prognosis.  CODE STATUS changed to DNR.  Might be a candidate for hospice/comfort care if further declines.     Deconditioning/debility: PT/OT recommending home health.  Wife requesting for a SNF.  Now the plan  is for SNF, TOC following  Nutrition Problem: Severe Malnutrition Etiology: acute illness,  Start Remeron 7.5 mg daily   DVT prophylaxis: SCDs / Eliquis Code Status: DNR Family Communication:  Wife at bed side. Disposition Plan:     Status is: Inpatient Remains inpatient appropriate because:  Anticipated DC to SNF in 1-2 days.   Consultants:  GI General Surgery PCCM Palliative care  Procedures: ERCP/PTC Antimicrobials:  Anti-infectives (From admission, onward)    Start     Dose/Rate Route Frequency Ordered Stop   02/10/23 1030  levofloxacin (LEVAQUIN) IVPB 750 mg        750 mg 100 mL/hr over 90 Minutes Intravenous Every 24 hours 02/10/23 0931 02/20/23 0959   02/10/23 1030  metroNIDAZOLE (FLAGYL) tablet 500 mg        500 mg Oral Every 12 hours 02/10/23 0931 02/20/23  0959   02/07/23 1445  piperacillin-tazobactam (ZOSYN) IVPB 3.375 g  Status:  Discontinued        3.375 g 12.5 mL/hr over 240 Minutes Intravenous Every 8 hours 02/07/23 1347 02/10/23 0931   02/07/23 1002  cefTRIAXone (ROCEPHIN) 2 g in sodium chloride 0.9 % 100 mL IVPB        over 30 Minutes Intravenous Continuous PRN 02/07/23 1011 02/07/23 1017   02/07/23 0945  cefTRIAXone (ROCEPHIN) 2 g in sodium chloride 0.9 % 100 mL IVPB        2 g 200 mL/hr over 30 Minutes Intravenous To Radiology 02/07/23 0845 02/08/23 0945   02/06/23 1000  amoxicillin-clavulanate (AUGMENTIN) 875-125 MG per tablet 1 tablet  Status:  Discontinued        1 tablet Oral Every 12 hours 02/05/23 1108 02/07/23 1347   02/05/23 1000  Vancomycin (VANCOCIN) 1,500 mg in sodium chloride 0.9 % 500 mL IVPB        1,500 mg 250 mL/hr over 120 Minutes Intravenous Every 24 hours 02/05/23 0903 02/05/23 1229   02/04/23 1000  vancomycin (VANCOCIN) IVPB 1000 mg/200 mL premix  Status:  Discontinued        1,000 mg 200 mL/hr over 60 Minutes Intravenous Every 24 hours 02/03/23 1122 02/05/23 0903   02/03/23 1000  Vancomycin (VANCOCIN) 1,500 mg in sodium chloride 0.9 % 500 mL IVPB  Status:  Discontinued        1,500 mg 250 mL/hr over 120 Minutes Intravenous Every 24 hours 02/02/23 1006 02/03/23 1122   02/02/23 2100  vancomycin (VANCOCIN) IVPB 1000 mg/200 mL premix  Status:  Discontinued        1,000 mg 200 mL/hr over 60 Minutes Intravenous Every 12 hours 02/02/23 0811 02/02/23 1006   02/02/23 1100  vancomycin (VANCOCIN) IVPB 1000 mg/200 mL premix       Placed in "Followed by" Linked Group   1,000 mg 200 mL/hr over 60 Minutes Intravenous  Once 02/02/23 0913 02/02/23 1500   02/02/23 1000  vancomycin (VANCOCIN) IVPB 1000 mg/200 mL premix       Placed in "Followed by" Linked Group   1,000 mg 200 mL/hr over 60 Minutes Intravenous  Once 02/02/23 0913 02/02/23 1340   02/02/23 0900  vancomycin (VANCOREADY) IVPB 2000 mg/400 mL  Status:  Discontinued         2,000 mg 200 mL/hr over 120 Minutes Intravenous  Once 02/02/23 0804 02/02/23 0913   02/02/23 0900  piperacillin-tazobactam (ZOSYN) IVPB 3.375 g        3.375 g 12.5 mL/hr over 240 Minutes Intravenous Every 8 hours 02/02/23 0805 02/05/23 2359   02/01/23 2200  amoxicillin-clavulanate (AUGMENTIN) 875-125 MG per tablet 1 tablet  Status:  Discontinued        1 tablet Oral Every 12 hours 02/01/23 1150 02/02/23 0738   01/31/23 1058  cefOXitin (MEFOXIN) 2 g in sodium chloride 0.9 % 100 mL IVPB        over 30 Minutes  Continuous PRN 01/31/23 1058 01/31/23 1058   01/31/23 0000  cefOXitin (MEFOXIN) 2 g in sodium chloride 0.9 % 100 mL IVPB  Status:  Discontinued        2 g 200 mL/hr over 30 Minutes Intravenous To Radiology 01/30/23 1014 01/30/23 1015   01/25/23 1600  ampicillin-sulbactam (UNASYN) 1.5 g in sodium chloride 0.9 % 100 mL IVPB  Status:  Discontinued        1.5 g 200 mL/hr over 30 Minutes Intravenous Every 6 hours 01/25/23 1525 02/01/23 1150       Subjective: Patient was seen and examined at bedside. Overnight events noted.   Patient reports pain is reasonably controlled.  Patient has 3 percutaneous biliary drains. Bilirubin is trending down,  Patient is feeling better,  still has poor appetite.  Objective: Vitals:   02/11/23 0459 02/11/23 0809 02/11/23 1055 02/11/23 1109  BP: 95/66 97/62  100/79  Pulse: 96 80  98  Resp: 15 18  19   Temp: 98.3 F (36.8 C) 98.2 F (36.8 C)  98.3 F (36.8 C)  TempSrc: Oral Oral  Oral  SpO2: 95% 98%  98%  Weight:   83.5 kg   Height:        Intake/Output Summary (Last 24 hours) at 02/11/2023 1256 Last data filed at 02/11/2023 1121 Gross per 24 hour  Intake 780 ml  Output 1975 ml  Net -1195 ml   Filed Weights   01/24/23 0953 02/05/23 0223 02/11/23 1055  Weight: 86.2 kg 82.5 kg 83.5 kg    Examination:  General exam: Appears comfortable, Jaundiced ,  deconditioned, not in any acute distress Respiratory system: CTA bilaterally .  Respiratory effort normal. RR 14 Cardiovascular system: S1 & S2 heard, RRR. No JVD, murmurs, rubs, gallops or clicks. No pedal edema. Gastrointestinal system: Abdomen is non distended, soft and  Mildly tender.  Normal bowel sounds heard. PCT x 3 noted. Central  nervous system: Alert and oriented x 3. No focal neurological deficits. Extremities: No edema, no cyanosis, no clubbing. Skin: No rashes, lesions or ulcers Psychiatry: Judgement and insight appear normal. Mood & affect appropriate.     Data Reviewed: I have personally reviewed following labs and imaging studies  CBC: Recent Labs  Lab 02/07/23 0204 02/08/23 0158 02/09/23 0207 02/11/23 0207  WBC 7.2 13.1* 11.0* 8.9  HGB 9.3* 9.5* 8.9* 8.1*  HCT 28.4* 28.3* 26.8* 23.8*  MCV 103.6* 104.8* 106.8* 103.5*  PLT 241 239 274 328   Basic Metabolic Panel: Recent Labs  Lab 02/07/23 0204 02/08/23 0158 02/09/23 0207 02/10/23 0203 02/11/23 0207  NA 130* 130* 134* 130* 130*  K 3.9 4.6 3.8 4.1 3.6  CL 95* 97* 102 98 96*  CO2 23 22 21* 21* 22  GLUCOSE 142* 160* 119* 89 86  BUN 30* 32* 34* 40* 35*  CREATININE 0.92 1.13 1.17 1.26* 0.99  CALCIUM 8.9 8.3* 8.4* 8.2* 8.1*  MG  --   --   --   --  2.2  PHOS  --   --   --   --  4.3   GFR: Estimated Creatinine Clearance: 65.5 mL/min (by C-G formula based on SCr of 0.99 mg/dL). Liver Function Tests: Recent Labs  Lab 02/07/23 0204 02/08/23 0158 02/09/23 0207 02/10/23 0203 02/11/23 0207  AST 112* 125* 71* 70* 74*  ALT 116* 130* 101* 87* 81*  ALKPHOS 276* 284* 217* 193* 187*  BILITOT 14.7* 17.0* 15.6* 13.9* 12.5*  PROT 5.6* 5.5* 5.2* 5.3* 4.7*  ALBUMIN 1.8* 1.7* 1.7* 1.6* <1.5*   No results for input(s): "LIPASE", "AMYLASE" in the last 168 hours. No results for input(s): "AMMONIA" in the last 168 hours. Coagulation Profile: No results for input(s): "INR", "PROTIME" in the last 168 hours. Cardiac Enzymes: No results for input(s): "CKTOTAL", "CKMB", "CKMBINDEX", "TROPONINI" in  the last 168 hours. BNP (last 3 results) No results for input(s): "PROBNP" in the last 8760 hours. HbA1C: No results for input(s): "HGBA1C" in the last 72 hours. CBG: No results for input(s): "GLUCAP" in the last 168 hours. Lipid Profile: No results for input(s): "CHOL", "HDL", "LDLCALC", "TRIG", "CHOLHDL", "LDLDIRECT" in the last 72 hours. Thyroid Function Tests: No results for input(s): "TSH", "T4TOTAL", "FREET4", "T3FREE", "THYROIDAB" in the last 72 hours. Anemia Panel: No results for input(s): "VITAMINB12", "FOLATE", "FERRITIN", "TIBC", "IRON", "RETICCTPCT" in the last 72 hours. Sepsis Labs: No results for input(s): "PROCALCITON", "LATICACIDVEN" in the last 168 hours.  Recent Results (from the past 240 hour(s))  Culture, blood (Routine X 2) w Reflex to ID Panel     Status: None   Collection Time: 02/02/23  8:03 AM   Specimen: BLOOD LEFT ARM  Result Value Ref Range Status   Specimen Description BLOOD LEFT ARM  Final   Special Requests   Final    BOTTLES DRAWN AEROBIC AND ANAEROBIC Blood Culture results may not be optimal due to an inadequate volume of blood received in culture bottles   Culture   Final    NO GROWTH 5 DAYS Performed at [facility], [address] [place], [place] [address]    Report Status 02/07/2023 FINAL  Final  Culture, blood (Routine X 2) w Reflex to ID Panel     Status: None   Collection Time: 02/02/23  8:04 AM   Specimen: BLOOD LEFT ARM  Result Value Ref Range Status   Specimen Description BLOOD LEFT ARM  Final   Special Requests  Final    BOTTLES DRAWN AEROBIC AND ANAEROBIC Blood Culture results may not be optimal due to an inadequate volume of blood received in culture bottles   Culture   Final    NO GROWTH 5 DAYS Performed at [facility], [address] [place], [place] [address]    Report Status 02/07/2023 FINAL  Final  Culture, blood (Routine X 2) w Reflex to ID Panel     Status: Abnormal   Collection Time: 02/07/23  2:28 PM    Specimen: BLOOD  Result Value Ref Range Status   Specimen Description BLOOD SITE NOT SPECIFIED  Final   Special Requests   Final    BOTTLES DRAWN AEROBIC ONLY Blood Culture adequate volume   Culture  Setup Time   Final    GRAM NEGATIVE RODS AEROBIC BOTTLE ONLY CRITICAL RESULT CALLED TO, READ BACK BY AND VERIFIED WITH: Meribeth Mattes [id number] AT 1356 BY EC Performed at [facility], [address] [place], [place] [address]    Culture PSEUDOMONAS AERUGINOSA (A)  Final   Report Status 02/10/2023 FINAL  Final   Organism ID, Bacteria PSEUDOMONAS AERUGINOSA  Final      Susceptibility   Pseudomonas aeruginosa - MIC*    CEFTAZIDIME 16 INTERMEDIATE Intermediate     CIPROFLOXACIN <=0.25 SENSITIVE Sensitive     GENTAMICIN 2 SENSITIVE Sensitive     IMIPENEM 1 SENSITIVE Sensitive     CEFEPIME 16 RESISTANT Resistant     * PSEUDOMONAS AERUGINOSA  Blood Culture ID Panel (Reflexed)     Status: Abnormal   Collection Time: 02/07/23  2:28 PM  Result Value Ref Range Status   Enterococcus faecalis NOT DETECTED NOT DETECTED Final   Enterococcus Faecium NOT DETECTED NOT DETECTED Final   Listeria monocytogenes NOT DETECTED NOT DETECTED Final   Staphylococcus species NOT DETECTED NOT DETECTED Final   Staphylococcus aureus (BCID) NOT DETECTED NOT DETECTED Final   Staphylococcus epidermidis NOT DETECTED NOT DETECTED Final   Staphylococcus lugdunensis NOT DETECTED NOT DETECTED Final   Streptococcus species NOT DETECTED NOT DETECTED Final   Streptococcus agalactiae NOT DETECTED NOT DETECTED Final   Streptococcus pneumoniae NOT DETECTED NOT DETECTED Final   Streptococcus pyogenes NOT DETECTED NOT DETECTED Final   A.calcoaceticus-baumannii NOT DETECTED NOT DETECTED Final   Bacteroides fragilis NOT DETECTED NOT DETECTED Final   Enterobacterales NOT DETECTED NOT DETECTED Final   Enterobacter cloacae complex NOT DETECTED NOT DETECTED Final   Escherichia coli NOT DETECTED NOT DETECTED Final    Klebsiella aerogenes NOT DETECTED NOT DETECTED Final   Klebsiella oxytoca NOT DETECTED NOT DETECTED Final   Klebsiella pneumoniae NOT DETECTED NOT DETECTED Final   Proteus species NOT DETECTED NOT DETECTED Final   Salmonella species NOT DETECTED NOT DETECTED Final   Serratia marcescens NOT DETECTED NOT DETECTED Final   Haemophilus influenzae NOT DETECTED NOT DETECTED Final   Neisseria meningitidis NOT DETECTED NOT DETECTED Final   Pseudomonas aeruginosa DETECTED (A) NOT DETECTED Final    Comment: CRITICAL RESULT CALLED TO, READ BACK BY AND VERIFIED WITH: PHARMD NATHAN GOAD [id number] AT 1356 BY EC    Stenotrophomonas maltophilia NOT DETECTED NOT DETECTED Final   Candida albicans NOT DETECTED NOT DETECTED Final   Candida auris NOT DETECTED NOT DETECTED Final   Candida glabrata NOT DETECTED NOT DETECTED Final   Candida krusei NOT DETECTED NOT DETECTED Final   Candida parapsilosis NOT DETECTED NOT DETECTED Final   Candida tropicalis NOT DETECTED NOT DETECTED Final   Cryptococcus  neoformans/gattii NOT DETECTED NOT DETECTED Final   CTX-M ESBL NOT DETECTED NOT DETECTED Final   Carbapenem resistance IMP NOT DETECTED NOT DETECTED Final   Carbapenem resistance KPC NOT DETECTED NOT DETECTED Final   Carbapenem resistance NDM NOT DETECTED NOT DETECTED Final   Carbapenem resistance VIM NOT DETECTED NOT DETECTED Final    Comment: Performed at [facility], [address], [place], [place] [address]  Culture, blood (Routine X 2) w Reflex to ID Panel     Status: Abnormal   Collection Time: 02/07/23  2:30 PM   Specimen: BLOOD  Result Value Ref Range Status   Specimen Description BLOOD SITE NOT SPECIFIED  Final   Special Requests   Final    BOTTLES DRAWN AEROBIC ONLY Blood Culture adequate volume   Culture  Setup Time GRAM NEGATIVE RODS AEROBIC BOTTLE ONLY   Final   Culture (A)  Final    PSEUDOMONAS AERUGINOSA SUSCEPTIBILITIES PERFORMED ON PREVIOUS CULTURE WITHIN THE LAST 5 DAYS. Performed  at [facility], [address] [place], [place] [address]    Report Status 02/10/2023 FINAL  Final    Radiology Studies: No results found.  Scheduled Meds:  sodium chloride   Intravenous Once   apixaban  5 mg Oral BID   divalproex  750 mg Oral BID   feeding supplement  237 mL Oral BID BM   metroNIDAZOLE  500 mg Oral Q12H   midodrine  10 mg Oral TID WC   multivitamin with minerals  1 tablet Oral Daily   sodium chloride flush  3 mL Intravenous Q12H   sodium chloride flush  5 mL Intracatheter Q8H   Continuous Infusions:  levofloxacin (LEVAQUIN) IV 750 mg (02/11/23 0949)     LOS: 16 days    Time spent: 35 mins    Willeen Niece, MD [facility]   If 7PM-7AM, please contact night-coverage

## 2023-02-11 NOTE — Plan of Care (Signed)
  Problem: Activity: °Goal: Risk for activity intolerance will decrease °Outcome: Progressing °  °Problem: Elimination: °Goal: Will not experience complications related to urinary retention °Outcome: Progressing °  °Problem: Safety: °Goal: Ability to remain free from injury will improve °Outcome: Progressing °  °Problem: Skin Integrity: °Goal: Risk for impaired skin integrity will decrease °Outcome: Progressing °  °

## 2023-02-11 NOTE — Progress Notes (Signed)
Mobility Specialist Progress Note:    02/11/23 1147  Mobility  Activity Ambulated with assistance in hallway  Level of Assistance Minimal assist, patient does 75% or more  Assistive Device Four wheel walker  Distance Ambulated (ft) 45 ft  Activity Response Tolerated fair  Mobility Referral Yes  Mobility visit 1 Mobility  Mobility Specialist Start Time (ACUTE ONLY) 1108  Mobility Specialist Stop Time (ACUTE ONLY) 1127  Mobility Specialist Time Calculation (min) (ACUTE ONLY) 19 min   Pt received in BR, agreeable to mobility. BM successful, NT assisted in peri care. Distance limited d/t pt being fatigued from going to BR. Pt left in chair with call bell and all needs met.  D'Vante Earlene Plater Mobility Specialist Please contact via Special educational needs teacher or Rehab office at [phone]

## 2023-02-11 NOTE — Progress Notes (Addendum)
Nutrition Follow-up  DOCUMENTATION CODES:   Non-severe (moderate) malnutrition in context of acute illness/injury  INTERVENTION:   - Provided high calorie, high protein handout   -  Ensure Enlive BID, each supplement provides 350 kcal and 20 grams of protein.   - Mighty Shake TID with meals, each supplement provides 330 kcals and 9 grams of protein  - Prosource Plus 30 ml BID, each supplement provides 100 kcal and 15 gm protein    -  MVI with minerals and snacks daily   - Encourage good PO intake on regular diet   NUTRITION DIAGNOSIS:   Severe Malnutrition related to acute illness as evidenced by mild muscle depletion, mild fat depletion.  - Ongoing   GOAL:   Patient will meet greater than or equal to 90% of their needs  - Progressing   MONITOR:   PO intake, Supplement acceptance, Labs  REASON FOR ASSESSMENT:   NPO/Clear Liquid Diet    ASSESSMENT:   Patient was recently admitted from 10/28 -11/1 for cholecystitis, diagnosed with Mirizzi syndrome ,underwent cholecystostomy. Readmitted 11/22 due to occlusion of the cystic duct and worsening jaundice. PMH of HTN, Afib, gout, congential mitral valve disease  10/31 - cholecystostomy tube placement  11/22- Readmitted, NPO 11/23 - Clears -> Heart healthy 11/24 - NPO 11/27 - Clears 11/28 - Regular 11/29 - PTC with R side biliary drain placement, NPO -> Regular  12/6 - L side bilary drain placement NPO -> Regular   -  2 drains on the right side, 1 drain on the middle/left side of the abdomen   Pt resting on bed with wife in room on visit. Pt states he does not have an appetite because he is tired of the hospital food and nothing tastes good. He has only been drinking 1-2 Ensure/day and is growing tired of them he also has not tried the [employer].   His wife reports she has not been bringing in food from home very often. Last week she brought him [employer] bread and he still did not eat much of it. His wife states he  usually weighs 200 pounds and is concerned of his poor protein, PO intake and his weight loss.   At home she states he was eating 3 meals/day and endorses a good appetite. Since his first admission from 10/28-11/11 she states he was eating less but still having adequate PO intake. She reports they followed a Mediterranean style diet at home, encouraged her to not restrict what pt eats right now and to bring in food whenever possible.   Spoke to MD about his poor PO intake and both agreed to start an Rameron.  Oncology workup still pending.   RD provided education regarding High-Calorie, High-Protein nutrition therapy.  RD provided "High-Calorie High-Protein Nutrition Therapy" handout from the Academy of Nutrition and Dietetics. Reviewed patient's dietary recall. Provided examples on ways to increase caloric density of foods and beverages frequently consumed by the patient. Also provided ideas to promote variety and to incorporate additional nutrient dense foods into patient's diet. Discussed eating small frequent meals and snacks to assist in increasing overall po intake. Teach back method used.  Admit weight: 82.5 kg  Current weight: 83.5 kg     Intake/Output Summary (Last 24 hours) at 02/11/2023 1320 Last data filed at 02/11/2023 1121 Gross per 24 hour  Intake 780 ml  Output 1975 ml  Net -1195 ml   Average Meal Intake: 12/2-12/9: 42% intake x 8 recorded meals  Nutritionally Relevant Medications:  Scheduled Meds:  sodium chloride   Intravenous Once   apixaban  5 mg Oral BID   divalproex  750 mg Oral BID   feeding supplement  237 mL Oral BID BM   metroNIDAZOLE  500 mg Oral Q12H   midodrine  10 mg Oral TID WC   multivitamin with minerals  1 tablet Oral Daily   Continuous Infusions:  levofloxacin (LEVAQUIN) IV 750 mg (02/11/23 0949)    Labs Reviewed: Sodium, Chloride 96, BUN 35, Calcium 8.1,   Diet Order:   Diet Order             Diet regular Room service appropriate? Yes;  Fluid consistency: Thin  Diet effective now                   EDUCATION NEEDS:   Education needs have been addressed  Skin:  Skin Assessment: Skin Integrity Issues: Skin Integrity Issues:: Other (Comment) Other: Jaundice  Last BM:  02/09/2023, type 5, medium  Height:   Ht Readings from Last 1 Encounters:  01/24/23 5\' 11"  (1.803 m)    Weight:   Wt Readings from Last 1 Encounters:  02/11/23 83.5 kg    Ideal Body Weight:  78.2 kg  BMI:  Body mass index is 25.66 kg/m.  Estimated Nutritional Needs:   Kcal:  1900-2100 kcal  Protein:  90-100 gm  Fluid:  >1.9 L   Elliot Dally, RD Registered Dietitian  See Amion for more information

## 2023-02-11 NOTE — Plan of Care (Signed)

## 2023-02-12 DIAGNOSIS — Z515 Encounter for palliative care: Secondary | ICD-10-CM | POA: Diagnosis not present

## 2023-02-12 DIAGNOSIS — K851 Biliary acute pancreatitis without necrosis or infection: Secondary | ICD-10-CM | POA: Diagnosis not present

## 2023-02-12 DIAGNOSIS — K831 Obstruction of bile duct: Secondary | ICD-10-CM | POA: Diagnosis not present

## 2023-02-12 DIAGNOSIS — E440 Moderate protein-calorie malnutrition: Secondary | ICD-10-CM | POA: Diagnosis not present

## 2023-02-12 LAB — COMPREHENSIVE METABOLIC PANEL
ALT: 93 U/L — ABNORMAL HIGH (ref 0–44)
AST: 89 U/L — ABNORMAL HIGH (ref 15–41)
Albumin: 1.7 g/dL — ABNORMAL LOW (ref 3.5–5.0)
Alkaline Phosphatase: 242 U/L — ABNORMAL HIGH (ref 38–126)
Anion gap: 10 (ref 5–15)
BUN: 36 mg/dL — ABNORMAL HIGH (ref 8–23)
CO2: 21 mmol/L — ABNORMAL LOW (ref 22–32)
Calcium: 8.6 mg/dL — ABNORMAL LOW (ref 8.9–10.3)
Chloride: 97 mmol/L — ABNORMAL LOW (ref 98–111)
Creatinine, Ser: 1.12 mg/dL (ref 0.61–1.24)
GFR, Estimated: >60 mL/min (ref >60)
Glucose, Bld: 104 mg/dL — ABNORMAL HIGH (ref 70–99)
Potassium: 4 mmol/L (ref 3.5–5.1)
Sodium: 128 mmol/L — ABNORMAL LOW (ref 135–145)
Total Bilirubin: 12.9 mg/dL — ABNORMAL HIGH (ref <1.2)
Total Protein: 5.6 g/dL — ABNORMAL LOW (ref 6.5–8.1)

## 2023-02-12 LAB — HEMOGLOBIN AND HEMATOCRIT, BLOOD
HCT: 27.1 % — ABNORMAL LOW (ref 39.0–52.0)
Hemoglobin: 9.1 g/dL — ABNORMAL LOW (ref 13.0–17.0)

## 2023-02-12 LAB — CYTOLOGY - NON PAP

## 2023-02-12 NOTE — Plan of Care (Signed)
  Problem: Clinical Measurements: Goal: Will remain free from infection Outcome: Progressing   Problem: Activity: Goal: Risk for activity intolerance will decrease Outcome: Progressing   Problem: Elimination: Goal: Will not experience complications related to urinary retention Outcome: Progressing   Problem: Pain Management: Goal: General experience of comfort will improve Outcome: Progressing   Problem: Safety: Goal: Ability to remain free from injury will improve Outcome: Progressing   Problem: Skin Integrity: Goal: Risk for impaired skin integrity will decrease Outcome: Progressing

## 2023-02-12 NOTE — Progress Notes (Signed)
Patient ID: Samuel Mayo, male   DOB: Feb 15, 1946, 77 y.o.   MRN: [id number]    Progress Note from the Palliative Medicine Team at [facility]   Patient Name: Samuel Mayo        Date: 02/12/2023 DOB: 1945/03/28  Age: 77 y.o. MRN#: [id number] Attending Physician: Lanae Boast, MD Primary Care Physician: [name], [name], MD Admit Date: 01/24/2023   Reason for Consultation/Follow-up   Establishing Goals of Care   HPI/ Brief Hospital Review  Patient is a 77 year old male with history of hypertension, atrial fibrillation, seizures, gout, MVR s/p angioplasty who presented with worsening jaundice.  Patient was recently admitted from 10/28 -11/1 for cholecystitis, diagnosed with Mirizzi syndrome ,underwent cholecystostomy.  Follow-up imaging showed persistent occlusion of the cystic duct, elevated liver enzymes so recommended to go to the emergency department.  Patient also  reported decreased appetite, weakness.  On presentation, he was hemodynamically stable.  Lab work showed sodium of 127, lipase of 1228, elevated liver enzymes, bilirubin of 20.  General surgery, GI consulted.  Attempted  ERCP but turned out to be unsuccessful.  IR did PTC with biliary drain placement on 11/29.  On 12/1,patient became febrile last night, hypotensive, tachycardic.  Underlying sepsis suspected.  Critical care , palliaitive care consulted.  Now clinically improving.  Current plan is to continue IV antibiotics, follow-up cultures, allowing improvement in the liver function.   Recent consult with [facility] oncology, awaits pathology reports.  Patient and family face ongoing treatment option decisions, advanced directive decisions and anticipatory care needs.   Subjective  Extensive chart review has been completed prior to meeting with patient/family  including labs, vital signs, imaging, progress/consult notes, orders, medications and available advance directive documents.   This NP assessed patient at  the bedside as a follow up for palliative medicine needs and emotional support.     Patient's wife at bedside.  Patient is alert and oriented he looks more tired today.  Ongoing  conversation regarding current medical situation.    Patient continues to struggle with over whelming fatigue and decreased appetite. He is progressing with mobility and continues to work hard with PT/OT.  Patient is beginning to question how much and for how long he wishes to pursue life-prolonging measures, this has been a hard difficult time for Mr Goggins  Patient awaits pathology reports.  Depending on outcome of the report, it may change decisional pathway moving forward.  Explored best case vs worse case scenario.   At this time when medically stable for discharge , patient and his wife are hopeful for SNF for short term rehab   Discussed with Mr Woodrome the importance of continued contemplation on his current medical situation and his response to current interventions.    Education offered today regarding  the importance of continued conversation with family and their  medical providers regarding overall plan of care and treatment options,  ensuring decisions are within the context of the patients values and GOCs.   PMT will continue to support holistically, family/patient  encouraged to call with  needs.  Questions and concerns addressed     Time: 50  minutes  Detailed review of medical records ( labs, imaging, vital signs), medically appropriate exam ( MS, skin, cardiac,  resp)   discussed with treatment team, counseling and education to patient, family, staff, documenting clinical information, medication management, coordination of care    Lorinda Creed NP  Palliative Medicine Team Team Phone # [phone] Pager [phone]

## 2023-02-12 NOTE — Progress Notes (Signed)
Referring Physician(s): Dr Leonides Schanz  Supervising Physician: Simonne Come  Patient Status:  [facility] - In-pt  Chief Complaint:  S/p perc chole placement on 10/31; exchanged 11/22 F/u cholangiogram on 11/22 showed persistent occlusion of the cystic duct without opacification of the CBD. Admitted on 11/22 with elevated T bili and jaundice, ERCP was unsuccessful  S/p int/ext bili drain placement in IR on 11/29  Subjective:  Up in bed; comfortable; alert No complaints 3 drains intact---- 1 Cholecystomy drain; 2 Biliary drains  Allergies: Other  Medications: Prior to Admission medications   Medication Sig Start Date End Date Taking? Authorizing Provider  amLODipine (NORVASC) 5 MG tablet TAKE 1 TABLET (5 MG TOTAL) BY MOUTH DAILY. 01/11/22  Yes [name], [name], MD  Cholecalciferol (VITAMIN D-3 PO) Take 1 capsule by mouth every evening.   Yes [provider]  divalproex (DEPAKOTE) 250 MG DR tablet Take 1 tablet (250 mg total) by mouth 2 (two) times daily. Take in addition to 500 mg twice a day (pt should have a total of 750 mg twice a day) 08/26/22  Yes Micki Riley, MD  divalproex (DEPAKOTE) 500 MG DR tablet Take 1 tablet (500 mg total) by mouth 2 (two) times daily. 05/20/22  Yes Micki Riley, MD  ELIQUIS 5 MG TABS tablet TAKE 1 TABLET BY MOUTH TWICE A DAY 08/07/22  Yes Mealor, Roberts Gaudy, MD  gabapentin (NEURONTIN) 300 MG capsule Take 1 capsule (300 mg total) by mouth at bedtime. 09/16/22  Yes Micki Riley, MD  ibandronate (BONIVA) 150 MG tablet TAKE 1 TABLET BY MOUTH EVERY 30 DAYS. TAKE IN AM WITH FULL GLASS OF WATER ON EMPTY STOMACH AND DON'T TAKE ANYTHING ELSE BY MOUTH OR LIE DOWN FOR THE NEXT 30 MINUTES Patient taking differently: Take 150 mg by mouth See admin instructions. Take 1 tablet (150mg ) once a month around the 20th. 07/22/22  Yes [name], [name], MD  LUTEIN PO Take 1 tablet by mouth every evening.   Yes [provider]  metoprolol succinate (TOPROL-XL) 25  MG 24 hr tablet TAKE 1 TABLET (25 MG TOTAL) BY MOUTH DAILY. 09/23/22  Yes BranchAlben Spittle, MD  Multiple Vitamin (MULTIVITAMIN WITH MINERALS) TABS tablet Take 1 tablet by mouth every evening.   Yes [provider]  OVER THE COUNTER MEDICATION Take 1 tablet by mouth 2 (two) times daily. Bone Up   Yes [provider]  amoxicillin-clavulanate (AUGMENTIN) 875-125 MG tablet Take 1 tablet by mouth 2 (two) times daily. Patient not taking: Reported on 01/24/2023    [provider]     Vital Signs: BP 103/70 (BP Location: Right Arm)   Pulse 87   Temp 98.1 F (36.7 C) (Oral)   Resp 12   Ht 5\' 11"  (1.803 m)   Wt 184 lb (83.5 kg)   SpO2 96%   BMI 25.66 kg/m   Physical Exam Skin:    General: Skin is warm and dry.     Comments: All sites are clean and dry NT No bleeding No sign of infection All flush and aspirate easily Bile OP     Imaging: No results found.  Labs:  CBC: Recent Labs    02/07/23 0204 02/08/23 0158 02/09/23 0207 02/11/23 0207 02/12/23 0217  WBC 7.2 13.1* 11.0* 8.9  --   HGB 9.3* 9.5* 8.9* 8.1* 9.1*  HCT 28.4* 28.3* 26.8* 23.8* 27.1*  PLT 241 239 274 328  --     COAGS: Recent Labs  12/30/22 2151 12/31/22 0705 12/31/22 1747 01/01/23 1154 01/25/23 0548 01/31/23 0516  INR 1.3*  --   --   --  1.2 1.1  APTT 36 34 61* 43*  --   --     BMP: Recent Labs    02/09/23 0207 02/10/23 0203 02/11/23 0207 02/12/23 0217  NA 134* 130* 130* 128*  K 3.8 4.1 3.6 4.0  CL 102 98 96* 97*  CO2 21* 21* 22 21*  GLUCOSE 119* 89 86 104*  BUN 34* 40* 35* 36*  CALCIUM 8.4* 8.2* 8.1* 8.6*  CREATININE 1.17 1.26* 0.99 1.12  GFRNONAA >60 58* >60 >60    LIVER FUNCTION TESTS: Recent Labs    02/09/23 0207 02/10/23 0203 02/11/23 0207 02/12/23 0217  BILITOT 15.6* 13.9* 12.5* 12.9*  AST 71* 70* 74* 89*  ALT 101* 87* 81* 93*  ALKPHOS 217* 193* 187* 242*  PROT 5.2* 5.3* 4.7* 5.6*  ALBUMIN 1.7* 1.6* <1.5* 1.7*   Drain Location: Rt  abd Size: Fr size: 12 Fr x 3 Date of placement: 10/31; 11/29; 12/6  Currently to: Drain collection device: gravity 24 hour output:  Output by Drain (mL) 02/10/23 0701 - 02/10/23 1900 02/10/23 1901 - 02/11/23 0700 02/11/23 0701 - 02/11/23 1900 02/11/23 1901 - 02/12/23 0631  Biliary Tube Cook slip-coat 10 Fr. RUQ 25 0 975 275  Biliary Tube Cook slip-coat 12 Fr. RLQ 275 125 60 50  Biliary Tube Cook slip-coat 10 Fr. LUQ 140 0 200 400    Interval imaging/drain manipulation:  12/6: IMPRESSION: 1. Successful brush biopsy of the stenotic segments of the common bile and right main bile ducts. 2. Successful replacement of 12 French right hepatic internal external biliary drain. 3. Successful insertion of 10 French left hepatic internal external biliary drain.  Current examination: Flushes/aspirates easily. - all 3 drains Insertion site unremarkable. Suture and stat lock in place. Dressed appropriately.  OP bile  Plan: Continue TID flushes with 5 cc NS. Record output Q shift. Dressing changes QD or PRN if soiled.  Call IR APP or on call IR MD if difficulty flushing or sudden change in drain output.    Discharge planning: Please contact IR APP or on call IR MD prior to patient d/c to ensure appropriate follow up plans are in place.  Upon DC- pt should have all drains flushed once daily IR will continue to follow - please call with questions or concerns.  Assessment and Plan:  Perc chole drain and 2 Biliary drains Should flush all drains once daily at home--- please Rx flushes upon DC Plan per [employer]  Electronically Signed: Robet Leu, PA-C 02/12/2023, 6:31 AM   I spent a total of 15 Minutes at the the patient's bedside AND on the patient's hospital floor or unit, greater than 50% of which was counseling/coordinating care for Perc chole drain and 2 Biliary drains

## 2023-02-12 NOTE — Progress Notes (Signed)
PROGRESS NOTE Samuel Mayo  WUJ:[id number] DOB: 1945/10/23 DOA: 01/24/2023 PCP: [name], [name], MD  Brief Narrative/Hospital Course: [phone] w/ Hx A-fib on Eliquis, HTN, seizure disorder, MVR, recent acute cholecystitis with abscess s/p cholecystostomy drain on 10/31 , exchanged 11/22 got admitted with obstructive jaundice on 11/22 with persistent occlusion of the cystic duct without opacification of the CBD. Seen by GI s/ p ERCP but unsuccessful and IR did PTC with biliary drain placement on 11/29.  On 12/1 became febrile hypotensive tachycardic-CCM and palliative care consulted. Left sided drain placement by IR on 12/6.  12/6 growing Pseudomonas aeruginosa and blood culture CA 19-9 elevated AFP normal oncology consulted.  Seen by ID advised Levaquin oral on discharge x 10 days  and outpatient follow-up 12/11: Currently with 3 drains: 1 cholecystostomy and 2 biliary drains-flushing once daily.    Subjective: Patient seen and examined this morning Wife at the bedside Having poor appetite, fussiness no new complaints   Assessment and Plan: Principal Problem:   Obstructive jaundice Active Problems:   Atrial fibrillation, chronic (HCC)   Hypertension   Seizure (HCC)   Cholecystitis   Acute biliary pancreatitis   Malnutrition of moderate degree   Hyperbilirubinemia   DNR (do not resuscitate)   DNR (do not resuscitate) discussion   Palliative care by specialist   Weakness generalized   Obstructive jaundice Possible biliary malignancy-CA 19-9 elevated 939,AFP normal: ERCP unsuccessful IR placed drains currently has 1 cholecystostomy tube and 2 biliary drains, flushing daily-will need follow-up with drain clinic with IR.  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 AST ALT T.  bili remains elevated, trend Recent Labs  Lab 02/07/23 0204 02/08/23 0158 02/09/23 0207 02/10/23 0203 02/11/23 0207 02/12/23 0217  AST 112* 125* 71* 70* 74* 89*  ALT 116* 130* 101* 87* 81* 93*  ALKPHOS 276* 284* 217* 193* 187* 242*  BILITOT 14.7* 17.0* 15.6* 13.9* 12.5* 12.9*  PROT 5.6* 5.5* 5.2* 5.3* 4.7* 5.6*  ALBUMIN 1.8* 1.7* 1.7* 1.6* <1.5* 1.7*  PLT 241 239 274  --  328  --     Sepsis secondary to cholangitis-febrile on 11/13 with leukocytosis Pseudomonas bacteremia Recent acute cholecystitis with abscess s/p cholecystostomy drain on 10/31 , exchanged 11/22: ID advised Levaquin oral on discharge x 10 days  and outpatient follow-up.  Currently afebrile, no leukocytosis.  Macrocytic anemia: Transfuse as needed.  Follow-up with oncology Recent Labs  Lab 02/07/23 0204 02/08/23 0158 02/09/23 0207 02/11/23 0207 02/12/23 0217  HGB 9.3* 9.5* 8.9* 8.1* 9.1*  HCT 28.4* 28.3* 26.8* 23.8* 27.1*    Hyponatremia Metabolic acidosis Dehydration AKI-resolved: Sodium at 128 bicarb 21 however creatinine normal, encourage oral intake.  Hypertension: stable,cont amlodipine, Toprol at home.Currently on hold due to hypotension.Continue midodrine   Paroxysmal A-fib: Heart rate is stable [place]ropol on hold due to soft BP on Eliquis    Seizure disorder:  Stable continue Depakote. Elevated liver enzymes is not from Depakote but from obstructive liver disease.   History of mitral valve regurgitation:  S/p annuloplasty.  Follow-up with cardiology as an outpatient.   Goals of care: Palliative care, critical care consulted.  Patient might have underlying biliary tumor which looks like unresectable as per general surgery.  Poor prognosis.  CODE STATUS changed to DNR.Might be a candidate for hospice/comfort care if further declines.  Deconditioning/debility:  Continue PT OT plan for skilled nursing facility at this time  Severe malnutrition : Augment diet as below  Etiology: acute  illness,  Start Remeron 7.5 mg daily  DVT prophylaxis: SCDs Start: 01/24/23 1337 Code Status:   Code Status: Limited: Do not attempt resuscitation (DNR) -DNR-LIMITED -Do Not Intubate/DNI  Family Communication: plan of care discussed with patient/wife at bedside. Patient status is: Remains hospitalized because of severity of illness Level of care: Progressive   Dispo: The patient is from: home            Anticipated disposition: snf 1-2 days Objective: Vitals last 24 hrs: Vitals:   02/11/23 1952 02/11/23 2250 02/12/23 0410 02/12/23 0748  BP: 110/71 115/70 103/70 108/73  Pulse: 89 84 87 80  Resp: 20 18 12 14   Temp: 98.3 F (36.8 C) 98.7 F (37.1 C) 98.1 F (36.7 C) 97.6 F (36.4 C)  TempSrc: Oral Oral Oral Oral  SpO2: 98% 96% 96% 96%  Weight:      Height:       Weight change:   Physical Examination: General exam: alert awake,at baseline, older than stated age HEENT:Oral mucosa moist, Ear/Nose WNL grossly Respiratory system: Bilaterally clear BS,no use of accessory muscle Cardiovascular system: S1 & S2 +, No JVD. Gastrointestinal system: Abdomen soft,NT,ND, BS+ Nervous System: Alert, awake, moving all extremities,and following commands. Extremities: LE edema neg,distal peripheral pulses palpable and warm.  Skin: No rashes++ icterus. MSK: Normal muscle bulk,tone, power   Medications reviewed:  Scheduled Meds:  (feeding supplement) PROSource Plus  30 mL Oral BID BM   sodium chloride   Intravenous Once   apixaban  5 mg Oral BID   divalproex  750 mg Oral BID   feeding supplement  237 mL Oral BID BM   metroNIDAZOLE  500 mg Oral Q12H   midodrine  10 mg Oral TID WC   multivitamin with minerals  1 tablet Oral Daily   sodium chloride flush  3 mL Intravenous Q12H   sodium chloride flush  5 mL Intracatheter Q8H   Continuous Infusions:  levofloxacin (LEVAQUIN) IV 750 mg (02/12/23 3244)    Diet Order             Diet regular Room service appropriate? Yes; Fluid  consistency: Thin  Diet effective now                  Intake/Output Summary (Last 24 hours) at 02/12/2023 1012 Last data filed at 02/12/2023 0749 Gross per 24 hour  Intake 178.42 ml  Output 2410 ml  Net -2231.58 ml   Net IO Since Admission: -13,133.81 mL [02/12/23 1012]  Wt Readings from Last 3 Encounters:  02/11/23 83.5 kg  01/13/23 85.8 kg  01/03/23 91.3 kg     Unresulted Labs (From admission, onward)    None     Data Reviewed: I have personally reviewed following labs and imaging studies CBC: Recent Labs  Lab 02/07/23 0204 02/08/23 0158 02/09/23 0207 02/11/23 0207 02/12/23 0217  WBC 7.2 13.1* 11.0* 8.9  --   HGB 9.3* 9.5* 8.9* 8.1* 9.1*  HCT 28.4* 28.3* 26.8* 23.8* 27.1*  MCV 103.6* 104.8* 106.8* 103.5*  --   PLT 241 239 274 328  --    Basic Metabolic Panel:  Recent Labs  Lab 02/08/23 0158 02/09/23 0207 02/10/23 0203 02/11/23 0207 02/12/23 0217  NA 130* 134* 130* 130* 128*  K 4.6 3.8 4.1 3.6 4.0  CL 97* 102 98 96* 97*  CO2 22 21*  21* 22 21*  GLUCOSE 160* 119* 89 86 104*  BUN 32* 34* 40* 35* 36*  CREATININE 1.13 1.17 1.26* 0.99 1.12  CALCIUM 8.3* 8.4* 8.2* 8.1* 8.6*  MG  --   --   --  2.2  --   PHOS  --   --   --  4.3  --    GFR: Estimated Creatinine Clearance: 57.9 mL/min (by C-G formula based on SCr of 1.12 mg/dL). Liver Function Tests:  Recent Labs  Lab 02/08/23 0158 02/09/23 0207 02/10/23 0203 02/11/23 0207 02/12/23 0217  AST 125* 71* 70* 74* 89*  ALT 130* 101* 87* 81* 93*  ALKPHOS 284* 217* 193* 187* 242*  BILITOT 17.0* 15.6* 13.9* 12.5* 12.9*  PROT 5.5* 5.2* 5.3* 4.7* 5.6*  ALBUMIN 1.7* 1.7* 1.6* <1.5* 1.7*  Sepsis Labs: No results for input(s): "PROCALCITON", "LATICACIDVEN" in the last 168 hours. Recent Results (from the past 240 hour(s))  Culture, blood (Routine X 2) w Reflex to ID Panel     Status: Abnormal   Collection Time: 02/07/23  2:28 PM   Specimen: BLOOD  Result Value Ref Range Status   Specimen Description BLOOD  SITE NOT SPECIFIED  Final   Special Requests   Final    BOTTLES DRAWN AEROBIC ONLY Blood Culture adequate volume   Culture  Setup Time   Final    GRAM NEGATIVE RODS AEROBIC BOTTLE ONLY CRITICAL RESULT CALLED TO, READ BACK BY AND VERIFIED WITH: Meribeth Mattes [id number] AT 1356 BY EC Performed at [facility], [address], [place], [place] [address]    Culture PSEUDOMONAS AERUGINOSA (A)  Final   Report Status 02/10/2023 FINAL  Final   Organism ID, Bacteria PSEUDOMONAS AERUGINOSA  Final      Susceptibility   Pseudomonas aeruginosa - MIC*    CEFTAZIDIME 16 INTERMEDIATE Intermediate     CIPROFLOXACIN <=0.25 SENSITIVE Sensitive     GENTAMICIN 2 SENSITIVE Sensitive     IMIPENEM 1 SENSITIVE Sensitive     CEFEPIME 16 RESISTANT Resistant     * PSEUDOMONAS AERUGINOSA  Blood Culture ID Panel (Reflexed)     Status: Abnormal   Collection Time: 02/07/23  2:28 PM  Result Value Ref Range Status   Enterococcus faecalis NOT DETECTED NOT DETECTED Final   Enterococcus Faecium NOT DETECTED NOT DETECTED Final   Listeria monocytogenes NOT DETECTED NOT DETECTED Final   Staphylococcus species NOT DETECTED NOT DETECTED Final   Staphylococcus aureus (BCID) NOT DETECTED NOT DETECTED Final   Staphylococcus epidermidis NOT DETECTED NOT DETECTED Final   Staphylococcus lugdunensis NOT DETECTED NOT DETECTED Final   Streptococcus species NOT DETECTED NOT DETECTED Final   Streptococcus agalactiae NOT DETECTED NOT DETECTED Final   Streptococcus pneumoniae NOT DETECTED NOT DETECTED Final   Streptococcus pyogenes NOT DETECTED NOT DETECTED Final   A.calcoaceticus-baumannii NOT DETECTED NOT DETECTED Final   Bacteroides fragilis NOT DETECTED NOT DETECTED Final   Enterobacterales NOT DETECTED NOT DETECTED Final   Enterobacter cloacae complex NOT DETECTED NOT DETECTED Final   Escherichia coli NOT DETECTED NOT DETECTED Final   Klebsiella aerogenes NOT DETECTED NOT DETECTED Final   Klebsiella oxytoca NOT  DETECTED NOT DETECTED Final   Klebsiella pneumoniae NOT DETECTED NOT DETECTED Final   Proteus species NOT DETECTED NOT DETECTED Final   Salmonella species NOT DETECTED NOT DETECTED Final   Serratia marcescens NOT DETECTED NOT DETECTED Final   Haemophilus influenzae NOT DETECTED NOT DETECTED Final   Neisseria meningitidis NOT DETECTED NOT DETECTED Final  Pseudomonas aeruginosa DETECTED (A) NOT DETECTED Final    Comment: CRITICAL RESULT CALLED TO, READ BACK BY AND VERIFIED WITH: PHARMD NATHAN GOAD [id number] AT 1356 BY EC    Stenotrophomonas maltophilia NOT DETECTED NOT DETECTED Final   Candida albicans NOT DETECTED NOT DETECTED Final   Candida auris NOT DETECTED NOT DETECTED Final   Candida glabrata NOT DETECTED NOT DETECTED Final   Candida krusei NOT DETECTED NOT DETECTED Final   Candida parapsilosis NOT DETECTED NOT DETECTED Final   Candida tropicalis NOT DETECTED NOT DETECTED Final   Cryptococcus neoformans/gattii NOT DETECTED NOT DETECTED Final   CTX-M ESBL NOT DETECTED NOT DETECTED Final   Carbapenem resistance IMP NOT DETECTED NOT DETECTED Final   Carbapenem resistance KPC NOT DETECTED NOT DETECTED Final   Carbapenem resistance NDM NOT DETECTED NOT DETECTED Final   Carbapenem resistance VIM NOT DETECTED NOT DETECTED Final    Comment: Performed at [facility], [address], [place], [place] [address]  Culture, blood (Routine X 2) w Reflex to ID Panel     Status: Abnormal   Collection Time: 02/07/23  2:30 PM   Specimen: BLOOD  Result Value Ref Range Status   Specimen Description BLOOD SITE NOT SPECIFIED  Final   Special Requests   Final    BOTTLES DRAWN AEROBIC ONLY Blood Culture adequate volume   Culture  Setup Time GRAM NEGATIVE RODS AEROBIC BOTTLE ONLY   Final   Culture (A)  Final    PSEUDOMONAS AERUGINOSA SUSCEPTIBILITIES PERFORMED ON PREVIOUS CULTURE WITHIN THE LAST 5 DAYS. Performed at [facility], [address], [place], [place] [address]    Report  Status 02/10/2023 FINAL  Final    Antimicrobials/Microbiology: Anti-infectives (From admission, onward)    Start     Dose/Rate Route Frequency Ordered Stop   02/10/23 1030  levofloxacin (LEVAQUIN) IVPB 750 mg        750 mg 100 mL/hr over 90 Minutes Intravenous Every 24 hours 02/10/23 0931 02/20/23 0959   02/10/23 1030  metroNIDAZOLE (FLAGYL) tablet 500 mg        500 mg Oral Every 12 hours 02/10/23 0931 02/20/23 0959   02/07/23 1445  piperacillin-tazobactam (ZOSYN) IVPB 3.375 g  Status:  Discontinued        3.375 g 12.5 mL/hr over 240 Minutes Intravenous Every 8 hours 02/07/23 1347 02/10/23 0931   02/07/23 1002  cefTRIAXone (ROCEPHIN) 2 g in sodium chloride 0.9 % 100 mL IVPB        over 30 Minutes Intravenous Continuous PRN 02/07/23 1011 02/07/23 1017   02/07/23 0945  cefTRIAXone (ROCEPHIN) 2 g in sodium chloride 0.9 % 100 mL IVPB        2 g 200 mL/hr over 30 Minutes Intravenous To Radiology 02/07/23 0845 02/08/23 0945   02/06/23 1000  amoxicillin-clavulanate (AUGMENTIN) 875-125 MG per tablet 1 tablet  Status:  Discontinued        1 tablet Oral Every 12 hours 02/05/23 1108 02/07/23 1347   02/05/23 1000  Vancomycin (VANCOCIN) 1,500 mg in sodium chloride 0.9 % 500 mL IVPB        1,500 mg 250 mL/hr over 120 Minutes Intravenous Every 24 hours 02/05/23 0903 02/05/23 1229   02/04/23 1000  vancomycin (VANCOCIN) IVPB 1000 mg/200 mL premix  Status:  Discontinued        1,000 mg 200 mL/hr over 60 Minutes Intravenous Every 24 hours 02/03/23 1122 02/05/23 0903   02/03/23 1000  Vancomycin (VANCOCIN) 1,500 mg in sodium  chloride 0.9 % 500 mL IVPB  Status:  Discontinued        1,500 mg 250 mL/hr over 120 Minutes Intravenous Every 24 hours 02/02/23 1006 02/03/23 1122   02/02/23 2100  vancomycin (VANCOCIN) IVPB 1000 mg/200 mL premix  Status:  Discontinued        1,000 mg 200 mL/hr over 60 Minutes Intravenous Every 12 hours 02/02/23 0811 02/02/23 1006   02/02/23 1100  vancomycin (VANCOCIN) IVPB 1000  mg/200 mL premix       Placed in "Followed by" Linked Group   1,000 mg 200 mL/hr over 60 Minutes Intravenous  Once 02/02/23 0913 02/02/23 1500   02/02/23 1000  vancomycin (VANCOCIN) IVPB 1000 mg/200 mL premix       Placed in "Followed by" Linked Group   1,000 mg 200 mL/hr over 60 Minutes Intravenous  Once 02/02/23 0913 02/02/23 1340   02/02/23 0900  vancomycin (VANCOREADY) IVPB 2000 mg/400 mL  Status:  Discontinued        2,000 mg 200 mL/hr over 120 Minutes Intravenous  Once 02/02/23 0804 02/02/23 0913   02/02/23 0900  piperacillin-tazobactam (ZOSYN) IVPB 3.375 g        3.375 g 12.5 mL/hr over 240 Minutes Intravenous Every 8 hours 02/02/23 0805 02/05/23 2359   02/01/23 2200  amoxicillin-clavulanate (AUGMENTIN) 875-125 MG per tablet 1 tablet  Status:  Discontinued        1 tablet Oral Every 12 hours 02/01/23 1150 02/02/23 0738   01/31/23 1058  cefOXitin (MEFOXIN) 2 g in sodium chloride 0.9 % 100 mL IVPB        over 30 Minutes  Continuous PRN 01/31/23 1058 01/31/23 1058   01/31/23 0000  cefOXitin (MEFOXIN) 2 g in sodium chloride 0.9 % 100 mL IVPB  Status:  Discontinued        2 g 200 mL/hr over 30 Minutes Intravenous To Radiology 01/30/23 1014 01/30/23 1015   01/25/23 1600  ampicillin-sulbactam (UNASYN) 1.5 g in sodium chloride 0.9 % 100 mL IVPB  Status:  Discontinued        1.5 g 200 mL/hr over 30 Minutes Intravenous Every 6 hours 01/25/23 1525 02/01/23 1150         Component Value Date/Time   SDES BLOOD SITE NOT SPECIFIED 02/07/2023 1430   SPECREQUEST  02/07/2023 1430    BOTTLES DRAWN AEROBIC ONLY Blood Culture adequate volume   CULT (A) 02/07/2023 1430    PSEUDOMONAS AERUGINOSA SUSCEPTIBILITIES PERFORMED ON PREVIOUS CULTURE WITHIN THE LAST 5 DAYS. Performed at [facility], [address] [place], [place] [address]    REPTSTATUS 02/10/2023 FINAL 02/07/2023 1430    Radiology Studies: No results found.   LOS: 17 days   Total time spent in review of labs and imaging,  patient evaluation, formulation of plan, documentation and communication with family: 35 minutes  Lanae Boast, MD [facility]  02/12/2023, 10:12 AM

## 2023-02-12 NOTE — Progress Notes (Signed)
Occupational Therapy Treatment Patient Details Name: Samuel Mayo MRN: [id number] DOB: 05-28-1945 Today's Date: 02/12/2023   History of present illness 77 y.o. male [facility]/24/23 with worsening jaundice and follow up imaging from recent admit that showed non-visualization of CBD. MRI/MRCP showed increased mild to moderate intrahepatic bile duct dilation and new mural thickening and enhancement of the extrahepatic bile duct suspicious for cholangitis. IR performed PTC 11/29 with placement of internal/external drain. 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 Recent admit from 10/28 -11/1 for cholecystitis, diagnosed with Mirizzi syndrome ,underwent cholecystostomy. PMHx: seizure disorder on depakote, PAF, HTN, a-fib, acute biliary pancreatitis, cholecystitis, gout   OT comments  Seen for return to ADL, cognition, and challenging activity tolerance. Pt with good self initiation of standing rest breaks during mobility, but needing external encouragement to push himself. Pt needing CGA for OOB mobility and standing ADL. Able to recall 4/5 parts of a name and address during delayed memory recall. Will continue to follow. Patient will benefit from continued inpatient follow up therapy, <3 hours/day       If plan is discharge home, recommend the following:  A little help with walking and/or transfers;A little help with bathing/dressing/bathroom;Assistance with cooking/housework;Assist for transportation;Help with stairs or ramp for entrance   Equipment Recommendations  Other (comment) (defer)    Recommendations for Other Services      Precautions / Restrictions Precautions Precautions: Fall Precaution Comments: biliary tubes x 3 R UQ Restrictions Weight Bearing Restrictions: No       Mobility Bed Mobility Overal bed mobility: Needs Assistance Bed Mobility: Supine to Sit, Sit to Supine     Supine to sit: Contact guard Sit to supine: Contact guard assist         Transfers Overall transfer level: Needs assistance Equipment used: Rolling walker (2 wheels) Transfers: Sit to/from Stand Sit to Stand: Contact guard assist           General transfer comment: cues for hand placement     Balance Overall balance assessment: Needs assistance Sitting-balance support: Feet supported Sitting balance-Leahy Scale: Good     Standing balance support: Bilateral upper extremity supported Standing balance-Leahy Scale: Poor                             ADL either performed or assessed with clinical judgement   ADL Overall ADL's : Needs assistance/impaired     Grooming: Oral care;Contact guard assist;Standing                   Toilet Transfer: Contact guard assist;Rolling walker (2 wheels);Cueing for safety           Functional mobility during ADLs: Contact guard assist;Rolling walker (2 wheels) General ADL Comments: cues for safety during transfers. 5-10 steps and rest break during hallway mobility 90 ft    Extremity/Trunk Assessment Upper Extremity Assessment Upper Extremity Assessment: Generalized weakness   Lower Extremity Assessment Lower Extremity Assessment: Defer to PT evaluation        Vision   Vision Assessment?: No apparent visual deficits Additional Comments: Not formally assessed   Perception Perception Perception: Not tested   Praxis Praxis Praxis: Not tested    Cognition Arousal: Alert Behavior During Therapy: Flat affect Overall Cognitive Status: Impaired/Different from baseline Area of Impairment: Memory, Following commands, Safety/judgement, Problem solving, Awareness  Memory: Decreased short-term memory Following Commands: Follows one step commands consistently, Follows one step commands with increased time Safety/Judgement: Decreased awareness of safety, Decreased awareness of deficits Awareness: Emergent Problem Solving: Slow processing General  Comments: did nor recall his grand daughters visiting on Monday. pt able to recall 4/5 parts of a name and address presented to him in a moderately distracting hallway during mobility.        Exercises      Shoulder Instructions       General Comments VSS on TA with HR max 105    Pertinent Vitals/ Pain       Pain Assessment Pain Assessment: No/denies pain  Home Living                                          Prior Functioning/Environment              Frequency  Min 1X/week        Progress Toward Goals  OT Goals(current goals can now be found in the care plan section)  Progress towards OT goals: Progressing toward goals  Acute Rehab OT Goals Patient Stated Goal: to not fatigue so easily OT Goal Formulation: With patient Time For Goal Achievement: 02/23/23 Potential to Achieve Goals: Good ADL Goals Pt Will Perform Grooming: with modified independence;standing Pt Will Perform Lower Body Dressing: with modified independence;sit to/from stand Pt Will Transfer to Toilet: with modified independence;ambulating Pt/caregiver will Perform Home Exercise Program: Increased strength;With written HEP provided;Both right and left upper extremity Additional ADL Goal #1: Pt will pass short blessed test of cognition  Plan      Co-evaluation                 AM-PAC OT "6 Clicks" Daily Activity     Outcome Measure   Help from another person eating meals?: A Little Help from another person taking care of personal grooming?: A Little Help from another person toileting, which includes using toliet, bedpan, or urinal?: A Little Help from another person bathing (including washing, rinsing, drying)?: A Little Help from another person to put on and taking off regular upper body clothing?: A Little Help from another person to put on and taking off regular lower body clothing?: A Lot 6 Click Score: 17    End of Session Equipment Utilized During Treatment:  Gait belt;Rolling walker (2 wheels)  OT Visit Diagnosis: Unsteadiness on feet (R26.81);Muscle weakness (generalized) (M62.81);Other symptoms and signs involving cognitive function   Activity Tolerance Patient tolerated treatment well   Patient Left in bed;with call bell/phone within reach;with bed alarm set   Nurse Communication Mobility status        Time: 4098-1191 OT Time Calculation (min): 29 min  Charges: OT General Charges $OT Visit: 1 Visit OT Treatments $Self Care/Home Management : 8-22 mins $Therapeutic Activity: 8-22 mins  Myrla Halsted, OTD, OTR/L [facility] Acute Rehabilitation Office: [phone]   Myrla Halsted 02/12/2023, 11:51 AM

## 2023-02-12 NOTE — Progress Notes (Signed)
PT Cancellation Note  Patient Details Name: Samuel Mayo MRN: [id number] DOB: 01-12-1946   Cancelled Treatment:    Reason Eval/Treat Not Completed: Patient declined, no reason specified (Pt states he already ambulated today. Despite encouragement adn education pt declined; including declining just transferring to chair stating he was going to eat in bed. Will continue to follow up as able and appropriate.)  Harrel Carina, DPT, CLT  Acute Rehabilitation Services Office: [phone] (Secure chat preferred)   Claudia Desanctis 02/12/2023, 4:45 PM

## 2023-02-12 NOTE — Progress Notes (Addendum)
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  -Elevated tumor marker CA 19-9, AFP negative  -Total bilirubin has been reducing somewhat from 17.0-12.9 today -LFTs decreasing  -Cytology from biliary brushings confirms Adenocarcinoma -Dr. Lucille Mayo Onc to discuss goals of care; treatment options with patient and family.    2.  Macrocytic anemia -Hemoglobin 9.1 today -Transfuse PRBC for Hgb less than 7.0 -No indication for transfusion at this time -Continue to monitor CBC closely  3. Sepsis -likely secondary to cholangitis  -Continue antibiotics as ordered    Code Status DNR-Limited   Subjective:  Patient seen awake and alert sitting out of bed in chair at bedside. Reports he's okay, denies new acute complaints. No acute distress noted.   Objective:  Vitals:   02/12/23 0410 02/12/23 0748  BP: 103/70 108/73  Pulse: 87 80  Resp: 12 14  Temp: 98.1 F (36.7 C) 97.6 F (36.4 C)  SpO2: 96% 96%     Intake/Output Summary (Last 24 hours) at 02/12/2023 1103 Last data filed at 02/12/2023 2956 Gross per 24 hour  Intake 178.42 ml  Output 2410 ml  Net -2231.58 ml     REVIEW OF SYSTEMS:   Constitutional: +fatigue, Denies fevers, chills or abnormal night sweats Eyes: Denies blurriness of vision, double vision or watery eyes Ears, nose, mouth, throat, and face: Denies mucositis or sore throat Respiratory: Denies cough, dyspnea or wheezes Cardiovascular: Denies palpitation, chest discomfort or +bil LE swelling Gastrointestinal:  Denies nausea, heartburn or change in bowel habits Skin: Denies abnormal skin rashes Lymphatics: Denies new lymphadenopathy or easy bruising Neurological: Denies numbness, tingling or new weaknesses Behavioral/Psych:  Mood is stable, no new changes  All other systems were reviewed with the patient and are negative.  PHYSICAL EXAMINATION: ECOG PERFORMANCE STATUS: 3 - Symptomatic, >50% confined to bed  Vitals:   02/12/23 0410 02/12/23 0748  BP: 103/70 108/73  Pulse: 87 80  Resp: 12 14  Temp: 98.1 F (36.7 C) 97.6 F (36.4 C)  SpO2: 96% 96%   Filed Weights   01/24/23 0953 02/05/23 0223 02/11/23 1055  Weight: 190 lb (86.2 kg) 181 lb 14.1 oz (82.5 kg) 184 lb (83.5 kg)    GENERAL: alert, no distress and comfortable SKIN: +jaundice, texture, turgor are normal, no rashes or significant lesions EYES: normal, conjunctiva are pink and non-injected, sclera clear OROPHARYNX: no exudate, no erythema and lips, buccal mucosa, and tongue normal  NECK: supple, thyroid normal size, non-tender, without nodularity LYMPH: no palpable lymphadenopathy in the cervical, axillary or inguinal LUNGS: clear to auscultation and percussion with normal breathing effort HEART: regular rate & rhythm and no murmurs and +bil lower extremity edema ABDOMEN: abdomen soft, non-tender and normal bowel sounds +mild abd discomfort MUSCULOSKELETAL: no cyanosis of digits and no clubbing  PSYCH: alert & oriented x 3 with fluent speech NEURO: no focal motor/sensory deficits   All questions were answered. The patient knows to call the clinic with any problems, questions or concerns.   The total time spent in the appointment was 30 minutes encounter with patient including review of chart and various tests results, discussions about plan of care and coordination of care plan  Samuel Bills, NP 02/12/2023 11:03 AM    Labs Reviewed:  Lab Results  Component Value Date   WBC 8.9 02/11/2023  HGB 9.1 (L) 02/12/2023   HCT 27.1 (L) 02/12/2023   MCV 103.5 (H) 02/11/2023   PLT 328 02/11/2023   Recent Labs    12/23/22 1055 12/27/22 0946 12/30/22 1600 01/13/23 0901 01/24/23 1020 02/10/23 0203 02/11/23 0207 02/12/23 0217  NA 131*  132*   < > 132*   < > 130* 130* 128*  K 4.4 4.4   < > 4.1   < > 4.1 3.6 4.0  CL 95* 95*   < > 95*   < > 98 96* 97*  CO2 27 29   < > 28   < > 21* 22 21*  GLUCOSE 111* 116*   < > 134*   < > 89 86 104*  BUN 23 31*   < > 20   < > 40* 35* 36*  CREATININE 1.12 1.08   < > 1.09   < > 1.26* 0.99 1.12  CALCIUM 9.1 9.0   < > 8.9   < > 8.2* 8.1* 8.6*  GFRNONAA  --   --    < >  --    < > 58* >60 >60  PROT 6.6 6.8   < > 6.1   < > 5.3* 4.7* 5.6*  ALBUMIN 3.6 3.5   < > 3.4*   < > 1.6* <1.5* 1.7*  AST 49* 28   < > 135*   < > 70* 74* 89*  ALT 108* 50   < > 150*   < > 87* 81* 93*  ALKPHOS 303* 362*   < > 694*   < > 193* 187* 242*  BILITOT 0.7 1.0   < > 3.9*   < > 13.9* 12.5* 12.9*  BILIDIR 0.3 0.3  --  2.5*  --   --   --   --    < > = values in this interval not displayed.    Studies Reviewed:  IR INT EXT BILIARY DRAIN WITH CHOLANGIOGRAM  Result Date: 02/07/2023 INDICATION: 77 year old gentleman with history of biliary obstruction underwent percutaneous cholecystostomy placement on 01/02/2023 and replacement on 01/24/2023. Continued symptoms of jaundice and hyperbilirubinemia prompteda MRI on 01/24/2023. Enhancement of the ductal system was suspicious for cholangitis. Right hepatic biliary drain was placed on 01/31/2023. 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 Despite placement of the right hepatic drain, hyperbilirubinemia and jaundice has persisted. He returns today for placement of left hepatic biliary drain as well as brush biopsy of the stenosed segment of the right hepatic duct through a pre-existing access. EXAM: 1. Ultrasound and fluoroscopy guided left hepatic internal external biliary drain placement 2. Fluoroscopy guided exchange of existing right hepatic internal external biliary drain 3. Brush biopsy of common and right hepatic bile ducts 4. Bilateral percutaneous transhepatic cholangiogram MEDICATIONS:  Rocephin 2 g IV; The antibiotic was administered within an appropriate time frame prior to the initiation of the procedure. Demerol IV 50 mg ANESTHESIA/SEDATION: Moderate (conscious) sedation was employed during this procedure. A total of Versed 3 mg and Fentanyl 75 mcg was administered intravenously by the radiology nurse. Total intra-service moderate Sedation Time: 56 minutes. The patient's level of consciousness and vital signs were monitored continuously by radiology nursing throughout the procedure under my direct supervision. FLUOROSCOPY: Radiation Exposure Index (as provided by the fluoroscopic device): 98 mGy Kerma COMPLICATIONS: None immediate. PROCEDURE: Informed written consent was obtained from the patient after a thorough discussion of the procedural risks, benefits and  alternatives. All questions were addressed. Maximal Sterile Barrier Technique was utilized including caps, mask, sterile gowns, sterile gloves, sterile drape, hand hygiene and skin antiseptic. A timeout was performed prior to the initiation of the procedure. Patient positioned supine on the procedure table. Epigastric and right upper quadrant abdominal wall skin prepped and draped in the usual sterile fashion. Sonographic evaluation of the left hepatic lobe showed mildly dilated ducts. Following local lidocaine administration, a left hepatic lobe duct was successfully accessed with a 21 gauge needle. 21 gauge needle was removed over 0.018 inch guidewire. The guidewire was advanced to the level of the common bile duct. Contrast administered through the right hepatic drain confirmed appropriate positioning within the right hepatic bile ducts and doudenum. The right-sided 56 French drain was cut and removed over 0.035 inch guidewire and replaced with an 8 [place] sheath. The 8 French sheath was advanced beyond the site of right hepatic and common bile duct stenosis. Two brush biopsies were performed at the stenotic segments of the common and  right main hepatic ducts. Both samples were sent to pathology in sterile saline. New 12 French internal external biliary drain was inserted over the 0.035 inch guidewire. The pigtail was formed within the doudenum. Attention was again turned to the left hepatic lobe. Transitional dilator set was inserted over the 0.018 inch guidewire. Kumpe catheter and stiff glidewire were advanced to the level of the doudenum utilizing fluoroscopic guidance through this access. Kumpe the catheter was removed and a 10 [place] internal external biliary drain was inserted. The distal pigtail was formed within the doudenum. Contrast administered through both drains confirmed appropriate positioning within the doudenum as well as within the intrahepatic bile ducts. Both drains were secured to skin with silk suture and connected to bag. IMPRESSION: 1. Successful brush biopsy of the stenotic segments of the common bile and right main bile ducts. 2. Successful replacement of 12 French right hepatic internal external biliary drain. 3. Successful insertion of 10 French left hepatic internal external biliary drain. Electronically Signed   By: Acquanetta Belling M.D.   On: 02/07/2023 16:36   IR ENDOLUMINAL BX OF BILIARY TREE  Result Date: 02/07/2023 INDICATION: 77 year old gentleman with history of biliary obstruction underwent percutaneous cholecystostomy placement on 01/02/2023 and replacement on 01/24/2023. Continued symptoms of jaundice and hyperbilirubinemia prompteda MRI on 01/24/2023. Enhancement of the ductal system was suspicious for cholangitis. Right hepatic biliary drain was placed on 01/31/2023. 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 Despite placement of the right hepatic drain, hyperbilirubinemia and jaundice has persisted. He returns today for placement of left hepatic biliary drain as well as brush biopsy of the stenosed segment of  the right hepatic duct through a pre-existing access. EXAM: 1. Ultrasound and fluoroscopy guided left hepatic internal external biliary drain placement 2. Fluoroscopy guided exchange of existing right hepatic internal external biliary drain 3. Brush biopsy of common and right hepatic bile ducts 4. Bilateral percutaneous transhepatic cholangiogram MEDICATIONS: Rocephin 2 g IV; The antibiotic was administered within an appropriate time frame prior to the initiation of the procedure. Demerol IV 50 mg ANESTHESIA/SEDATION: Moderate (conscious) sedation was employed during this procedure. A total of Versed 3 mg and Fentanyl 75 mcg was administered intravenously by the radiology nurse. Total intra-service moderate Sedation Time: 56 minutes. The patient's level of consciousness and vital signs were monitored continuously by radiology nursing throughout the procedure under my direct supervision. FLUOROSCOPY: Radiation  Exposure Index (as provided by the fluoroscopic device): 98 mGy Kerma COMPLICATIONS: None immediate. PROCEDURE: Informed written consent was obtained from the patient after a thorough discussion of the procedural risks, benefits and alternatives. All questions were addressed. Maximal Sterile Barrier Technique was utilized including caps, mask, sterile gowns, sterile gloves, sterile drape, hand hygiene and skin antiseptic. A timeout was performed prior to the initiation of the procedure. Patient positioned supine on the procedure table. Epigastric and right upper quadrant abdominal wall skin prepped and draped in the usual sterile fashion. Sonographic evaluation of the left hepatic lobe showed mildly dilated ducts. Following local lidocaine administration, a left hepatic lobe duct was successfully accessed with a 21 gauge needle. 21 gauge needle was removed over 0.018 inch guidewire. The guidewire was advanced to the level of the common bile duct. Contrast administered through the right hepatic drain confirmed  appropriate positioning within the right hepatic bile ducts and doudenum. The right-sided 25 French drain was cut and removed over 0.035 inch guidewire and replaced with an 8 [place] sheath. The 8 French sheath was advanced beyond the site of right hepatic and common bile duct stenosis. Two brush biopsies were performed at the stenotic segments of the common and right main hepatic ducts. Both samples were sent to pathology in sterile saline. New 12 French internal external biliary drain was inserted over the 0.035 inch guidewire. The pigtail was formed within the doudenum. Attention was again turned to the left hepatic lobe. Transitional dilator set was inserted over the 0.018 inch guidewire. Kumpe catheter and stiff glidewire were advanced to the level of the doudenum utilizing fluoroscopic guidance through this access. Kumpe the catheter was removed and a 10 [place] internal external biliary drain was inserted. The distal pigtail was formed within the doudenum. Contrast administered through both drains confirmed appropriate positioning within the doudenum as well as within the intrahepatic bile ducts. Both drains were secured to skin with silk suture and connected to bag. IMPRESSION: 1. Successful brush biopsy of the stenotic segments of the common bile and right main bile ducts. 2. Successful replacement of 12 French right hepatic internal external biliary drain. 3. Successful insertion of 10 French left hepatic internal external biliary drain. Electronically Signed   By: Acquanetta Belling M.D.   On: 02/07/2023 16:36   IR ENDOLUMINAL BX OF BILIARY TREE  Result Date: 02/07/2023 INDICATION: 77 year old gentleman with history of biliary obstruction underwent percutaneous cholecystostomy placement on 01/02/2023 and replacement on 01/24/2023. Continued symptoms of jaundice and hyperbilirubinemia prompteda MRI on 01/24/2023. Enhancement of the ductal system was suspicious for cholangitis. Right hepatic biliary drain was  placed on 01/31/2023. 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 Despite placement of the right hepatic drain, hyperbilirubinemia and jaundice has persisted. He returns today for placement of left hepatic biliary drain as well as brush biopsy of the stenosed segment of the right hepatic duct through a pre-existing access. EXAM: 1. Ultrasound and fluoroscopy guided left hepatic internal external biliary drain placement 2. Fluoroscopy guided exchange of existing right hepatic internal external biliary drain 3. Brush biopsy of common and right hepatic bile ducts 4. Bilateral percutaneous transhepatic cholangiogram MEDICATIONS: Rocephin 2 g IV; The antibiotic was administered within an appropriate time frame prior to the initiation of the procedure. Demerol IV 50 mg ANESTHESIA/SEDATION: Moderate (conscious) sedation was employed during this procedure. A total of Versed 3 mg and Fentanyl 75 mcg was administered intravenously by  the radiology nurse. Total intra-service moderate Sedation Time: 56 minutes. The patient's level of consciousness and vital signs were monitored continuously by radiology nursing throughout the procedure under my direct supervision. FLUOROSCOPY: Radiation Exposure Index (as provided by the fluoroscopic device): 98 mGy Kerma COMPLICATIONS: None immediate. PROCEDURE: Informed written consent was obtained from the patient after a thorough discussion of the procedural risks, benefits and alternatives. All questions were addressed. Maximal Sterile Barrier Technique was utilized including caps, mask, sterile gowns, sterile gloves, sterile drape, hand hygiene and skin antiseptic. A timeout was performed prior to the initiation of the procedure. Patient positioned supine on the procedure table. Epigastric and right upper quadrant abdominal wall skin prepped and draped in the usual sterile fashion. Sonographic  evaluation of the left hepatic lobe showed mildly dilated ducts. Following local lidocaine administration, a left hepatic lobe duct was successfully accessed with a 21 gauge needle. 21 gauge needle was removed over 0.018 inch guidewire. The guidewire was advanced to the level of the common bile duct. Contrast administered through the right hepatic drain confirmed appropriate positioning within the right hepatic bile ducts and doudenum. The right-sided 46 French drain was cut and removed over 0.035 inch guidewire and replaced with an 8 [place] sheath. The 8 French sheath was advanced beyond the site of right hepatic and common bile duct stenosis. Two brush biopsies were performed at the stenotic segments of the common and right main hepatic ducts. Both samples were sent to pathology in sterile saline. New 12 French internal external biliary drain was inserted over the 0.035 inch guidewire. The pigtail was formed within the doudenum. Attention was again turned to the left hepatic lobe. Transitional dilator set was inserted over the 0.018 inch guidewire. Kumpe catheter and stiff glidewire were advanced to the level of the doudenum utilizing fluoroscopic guidance through this access. Kumpe the catheter was removed and a 10 [place] internal external biliary drain was inserted. The distal pigtail was formed within the doudenum. Contrast administered through both drains confirmed appropriate positioning within the doudenum as well as within the intrahepatic bile ducts. Both drains were secured to skin with silk suture and connected to bag. IMPRESSION: 1. Successful brush biopsy of the stenotic segments of the common bile and right main bile ducts. 2. Successful replacement of 12 French right hepatic internal external biliary drain. 3. Successful insertion of 10 French left hepatic internal external biliary drain. Electronically Signed   By: Acquanetta Belling M.D.   On: 02/07/2023 16:36   IR EXCHANGE BILIARY DRAIN  Result  Date: 02/07/2023 INDICATION: 77 year old gentleman with history of biliary obstruction underwent percutaneous cholecystostomy placement on 01/02/2023 and replacement on 01/24/2023. Continued symptoms of jaundice and hyperbilirubinemia prompteda MRI on 01/24/2023. Enhancement of the ductal system was suspicious for cholangitis. Right hepatic biliary drain was placed on 01/31/2023. 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 Despite placement of the right hepatic drain, hyperbilirubinemia and jaundice has persisted. He returns today for placement of left hepatic biliary drain as well as brush biopsy of the stenosed segment of the right hepatic duct through a pre-existing access. EXAM: 1. Ultrasound and fluoroscopy guided left hepatic internal external biliary drain placement 2. Fluoroscopy guided exchange of existing right hepatic internal external biliary drain 3. Brush biopsy of common and right hepatic bile ducts 4. Bilateral percutaneous transhepatic cholangiogram MEDICATIONS: Rocephin 2 g IV; The antibiotic was administered within an appropriate time frame prior to the  initiation of the procedure. Demerol IV 50 mg ANESTHESIA/SEDATION: Moderate (conscious) sedation was employed during this procedure. A total of Versed 3 mg and Fentanyl 75 mcg was administered intravenously by the radiology nurse. Total intra-service moderate Sedation Time: 56 minutes. The patient's level of consciousness and vital signs were monitored continuously by radiology nursing throughout the procedure under my direct supervision. FLUOROSCOPY: Radiation Exposure Index (as provided by the fluoroscopic device): 98 mGy Kerma COMPLICATIONS: None immediate. PROCEDURE: Informed written consent was obtained from the patient after a thorough discussion of the procedural risks, benefits and alternatives. All questions were addressed. Maximal Sterile Barrier  Technique was utilized including caps, mask, sterile gowns, sterile gloves, sterile drape, hand hygiene and skin antiseptic. A timeout was performed prior to the initiation of the procedure. Patient positioned supine on the procedure table. Epigastric and right upper quadrant abdominal wall skin prepped and draped in the usual sterile fashion. Sonographic evaluation of the left hepatic lobe showed mildly dilated ducts. Following local lidocaine administration, a left hepatic lobe duct was successfully accessed with a 21 gauge needle. 21 gauge needle was removed over 0.018 inch guidewire. The guidewire was advanced to the level of the common bile duct. Contrast administered through the right hepatic drain confirmed appropriate positioning within the right hepatic bile ducts and doudenum. The right-sided 16 French drain was cut and removed over 0.035 inch guidewire and replaced with an 8 [place] sheath. The 8 French sheath was advanced beyond the site of right hepatic and common bile duct stenosis. Two brush biopsies were performed at the stenotic segments of the common and right main hepatic ducts. Both samples were sent to pathology in sterile saline. New 12 French internal external biliary drain was inserted over the 0.035 inch guidewire. The pigtail was formed within the doudenum. Attention was again turned to the left hepatic lobe. Transitional dilator set was inserted over the 0.018 inch guidewire. Kumpe catheter and stiff glidewire were advanced to the level of the doudenum utilizing fluoroscopic guidance through this access. Kumpe the catheter was removed and a 10 [place] internal external biliary drain was inserted. The distal pigtail was formed within the doudenum. Contrast administered through both drains confirmed appropriate positioning within the doudenum as well as within the intrahepatic bile ducts. Both drains were secured to skin with silk suture and connected to bag. IMPRESSION: 1. Successful brush  biopsy of the stenotic segments of the common bile and right main bile ducts. 2. Successful replacement of 12 French right hepatic internal external biliary drain. 3. Successful insertion of 10 French left hepatic internal external biliary drain. Electronically Signed   By: Acquanetta Belling M.D.   On: 02/07/2023 16:36   CT ABDOMEN W CONTRAST  Result Date: 02/07/2023 CLINICAL DATA:  78 year old male with a history of biliary obstruction. The original ultrasound 12/26/2022 demonstrates evidence of acute cholecystitis, which was present on a follow-up MRI 12/29/2022. Following this a percutaneous cholecystostomy was placed 01/02/2023 and replaced 01/24/2023. Ongoing symptoms of jaundice and hyperbilirubinemia prompted a second MRI 01/24/2023. MR suggested cholangitis given some enhancement along the ductal system, undrained intrahepatic bile ducts. Internal external biliary drain was placed 01/31/2023. 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 A follow-up CT 01/31/2023 and 02/02/2023 performed, with no radiopaque gallstones accounting for biliary obstruction. The patient has had ongoing hyperbilirubinemia which has plateaued at the level of the proximally 14 and remains  symptomatic. He presents for this CT study as a planning study preoperatively 4 left-sided biliary drainage. EXAM: CT ABDOMEN WITH CONTRAST TECHNIQUE: Multidetector CT imaging of the abdomen was performed using the standard protocol following bolus administration of intravenous contrast. RADIATION DOSE REDUCTION: This exam was performed according to the departmental dose-optimization program which includes automated exposure control, adjustment of the mA and/or kV according to patient size and/or use of iterative reconstruction technique. CONTRAST:  75mL OMNIPAQUE IOHEXOL 350 MG/ML SOLN COMPARISON:   Ultrasound 12/26/2022, MR 12/29/2022, per coli 01/02/2023, MR 01/24/2023, CT 01/31/2023, CT 02/02/2023 PTC and drainage 01/31/2023 FINDINGS: Lower chest: No acute finding of the lower chest. Minimal atelectasis and trace fluid. Hepatobiliary: Left-sided intrahepatic biliary ductal dilatation. There is associated periportal edema. There are a few small mildly dilated intrahepatic ducts of the right liver, particularly of the segment 8. Percutaneous transhepatic internal/external biliary drain via right-sided approach remains adequately position with the radiopaque marker in the bile ducts and the term in a shin of the drain in the small bowel. Percutaneous cholecystostomy tube in place within the fundus of the gallbladder. No radiopaque stones. There are no radiopaque stones identified along the course of the internal/external biliary drain. Region of hypodensity/hypoenhancement at the confluence of the left and right biliary ducts, at the liver hilum on image 19 of series 3. This region measures 13 mm x 42 mm on the axial images. No cirrhotic changes Pancreas: Attenuation/enhancement of the pancreas within normal limits. Mild ductal dilatation of the pancreatic duct. Spleen: Unremarkable Adrenals/Urinary Tract: - Right adrenal gland:  Unremarkable - Left adrenal gland: Unremarkable. - Right kidney: No hydronephrosis, nephrolithiasis, inflammation, or ureteral dilation. No focal lesion. - Left Kidney: No hydronephrosis, nephrolithiasis, inflammation, or ureteral dilation. No focal lesion. Stomach/Bowel: - Stomach: Small hiatal hernia.  Otherwise unremarkable stomach. 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 Internal/external biliary drain terminates in the duodenum. Visualized small bowel and colon unremarkable with no distension. Vascular/Lymphatic: Calcifications of the abdominal aorta. Mesenteric arteries and renal arteries patent. Other: None Musculoskeletal: Unchanged  configuration of the visualized vertebral bodies including compression fracture of L1 favored to be chronic. No bony canal narrowing. IMPRESSION: Right-sided internal/external biliary drain in adequate position across the right-sided biliary ducts and into the duodenum. The majority of the right-sided ductal system is decompressed, with some mild residual ductal dilatation involving segment 8. Persistent left-sided intrahepatic biliary ductal dilatation. Nonspecific focus of low attenuation/hypoenhancement at the confluence of the left and right-sided biliary ducts in the liver hilum. This may reflect focal edema, however, a Klatskin tumor cannot be excluded as etiology for the ductal dilatation. Percutaneous cholecystostomy in position in the gallbladder. Aortic Atherosclerosis (ICD10-I70.0). Electronically Signed   By: Gilmer Mor D.O.   On: 02/07/2023 11:21   DG CHEST PORT 1 VIEW  Result Date: 02/02/2023 CLINICAL DATA:  Fever EXAM: PORTABLE CHEST 1 VIEW COMPARISON:  12/26/2022 x-ray FINDINGS: Status post median sternotomy. Enlarged cardiopericardial silhouette with calcified tortuous aorta. Prosthetic valve. Overlapping cardiac leads. There is some linear opacity at the bases likely scar or atelectasis. No pneumothorax, effusion or edema. Healed right eighth rib fracture. IMPRESSION: Postop chest with enlarged heart and calcified aorta. Basilar atelectasis Electronically Signed   By: Karen Kays M.D.   On: 02/02/2023 16:08   CT ABDOMEN PELVIS WO CONTRAST  Result Date: 02/02/2023 CLINICAL DATA:  Anemia. EXAM: CT ABDOMEN AND PELVIS WITHOUT CONTRAST TECHNIQUE: Multidetector CT imaging of the abdomen and pelvis was performed following the standard  protocol without IV contrast. RADIATION DOSE REDUCTION: This exam was performed according to the departmental dose-optimization program which includes automated exposure control, adjustment of the mA and/or kV according to patient size and/or use of iterative  reconstruction technique. COMPARISON:  January 31, 2023. FINDINGS: Lower chest: Minimal bibasilar subsegmental atelectasis is noted. Hepatobiliary: Percutaneous cholecystostomy tube is again noted with decompression of the bladder. Stable position of internal external biliary drain with distal tip in duodenum. Stable mild left hepatic biliary dilatation with periportal edema. Pancreas: Unremarkable. No pancreatic ductal dilatation or surrounding inflammatory changes. Spleen: Normal in size without focal abnormality. Adrenals/Urinary Tract: Adrenal glands are unremarkable. Kidneys are normal, without renal calculi, focal lesion, or hydronephrosis. Bladder is unremarkable. Stomach/Bowel: Stomach is within normal limits. Appendix appears normal. No evidence of bowel wall thickening, distention, or inflammatory changes. Vascular/Lymphatic: Aortic atherosclerosis. No enlarged abdominal or pelvic lymph nodes. Reproductive: Mild prostatic enlargement is noted. Other: No abdominal wall hernia or abnormality. No abdominopelvic ascites. Musculoskeletal: Old L1 and L5 fractures are again noted. No acute abnormality seen. IMPRESSION: Percutaneous cholecystostomy tube is again noted. Stable position of internal external biliary drain with distal tip in duodenum. Stable mild left hepatic biliary dilatation with periportal edema. Electronically Signed   By: Lupita Raider M.D.   On: 02/02/2023 13:43   CT ABDOMEN PELVIS W CONTRAST  Result Date: 01/31/2023 CLINICAL DATA:  Gallbladder and biliary cancer. EXAM: CT ABDOMEN AND PELVIS WITH CONTRAST TECHNIQUE: Multidetector CT imaging of the abdomen and pelvis was performed using the standard protocol following bolus administration of intravenous contrast. RADIATION DOSE REDUCTION: This exam was performed according to the departmental dose-optimization program which includes automated exposure control, adjustment of the mA and/or kV according to patient size and/or use of  iterative reconstruction technique. CONTRAST:  75mL OMNIPAQUE IOHEXOL 350 MG/ML SOLN COMPARISON:  MRI abdomen 01/24/2023. FINDINGS: Lower chest: There is atelectasis in the lung bases with trace bilateral pleural effusions. Hepatobiliary: Transhepatic percutaneous biliary catheter present with distal tip in the duodenal. There is mild intrahepatic biliary ductal dilatation in the left lobe of the liver with some periportal edema similar to prior MRI given differences in technique. There is a small amount of pneumobilia and contrast throughout the biliary tree likely related to recent procedure. No focal liver lesions are seen. Percutaneous cholecystostomy tube present. There some contrast in the gallbladder. The gallbladder is decompressed and there is diffuse wall thickening similar to prior. Pancreas: Unremarkable. No pancreatic ductal dilatation or surrounding inflammatory changes. Spleen: Normal in size without focal abnormality. Adrenals/Urinary Tract: Adrenal glands are unremarkable. Kidneys are normal, without renal calculi, focal lesion, or hydronephrosis. Bladder is unremarkable. Stomach/Bowel: Stomach is within normal limits. Appendix appears normal. No evidence of bowel wall thickening, distention, or inflammatory changes. There is sigmoid colon diverticulosis. Vascular/Lymphatic: No significant vascular findings are present. No enlarged abdominal or pelvic lymph nodes. Reproductive: Prostate gland is enlarged. Other: There is presacral edema. There is trace free fluid in the right upper quadrant similar to prior. There is no focal abdominal wall hernia. Musculoskeletal: There are compression deformities of T8, T9, T11, L1 and L5 which are favored as chronic, but age indeterminate. The bones are diffusely osteopenic. There is mild body wall edema. IMPRESSION: 1. Transhepatic percutaneous biliary catheter present with distal tip in the duodenum. 2. Percutaneous cholecystostomy tube present. Gallbladder is  decompressed with diffuse wall thickening similar to prior. 3. Mild intrahepatic biliary ductal dilatation in the left lobe of the liver with periportal edema similar to prior  MRI. 4. Trace free fluid in the right upper quadrant similar to prior. 5. Trace bilateral pleural effusions. 6. Mild body wall edema. 7. Multiple compression deformities of the thoracic and lumbar spine are favored as chronic, but age indeterminate. Correlate clinically. Electronically Signed   By: Darliss Cheney M.D.   On: 01/31/2023 20:37   IR BILIARY DRAIN PLACEMENT WITH CHOLANGIOGRAM  Result Date: 01/31/2023 INDICATION: 77 year old male with biliary obstruction, presents for percutaneous transhepatic cholangiogram and internal/external drain placement EXAM: IMAGE GUIDED PERCUTANEOUS TRANSHEPATIC CHOLANGIOGRAM INTERNAL/EXTERNAL BILIARY DRAIN PLACEMENT MEDICATIONS: 2 g Mefoxin; The antibiotic was administered within an appropriate time frame prior to the initiation of the procedure. ANESTHESIA/SEDATION: Moderate (conscious) sedation was employed during this procedure. A total of Versed 2 mg and Fentanyl 150 mcg was administered intravenously by the radiology nurse. Total intra-service moderate Sedation Time: 43 minutes. The patient's level of consciousness and vital signs were monitored continuously by radiology nursing throughout the procedure under my direct supervision. FLUOROSCOPY: Radiation Exposure Index (as provided by the fluoroscopic device): 282 mGy Kerma COMPLICATIONS: None PROCEDURE: The procedure, risks, benefits, and alternatives were explained to the patient and the patient's family. A complete informed consent was performed, with risk benefit analysis. Specific risks that were discussed for the procedure include bleeding, infection, biliary sepsis, IC use day, organ injury, need for further procedure, need for further surgery, long-term drain placement, cardiopulmonary collapse, death. Questions regarding the procedure  were encouraged and answered. The patient understands and consents to the procedure. Patient is position in supine position on the fluoroscopy table, and the upper abdomen was prepped and draped in the usual sterile fashion. Maximum barrier sterile technique with sterile gowns and gloves were used for the procedure. A timeout was performed prior to the initiation of the procedure. Local anesthesia was provided with 1% lidocaine with epinephrine. Ultrasound survey of the left liver lobe was performed, with then ultrasound of the right liver lobe. We elected to access transhepatic biliary system via the right. 1% lidocaine was used for local anesthesia, with generous infiltration of the skin and subcutaneous tissues in and inter left costal location. A Chiba needle was advanced under ultrasound guidance into the right liver lobe, targeting biliary system. Once the tip of the needle was confirmed within the biliary system by injecting small aliquots of contrast, images were stored of the biliary system after partially opacifying the biliary tree via the needle. An 018 wire was then advanced centrally. The needle was removed, a small incision was made with an 11 blade scalpel, and then a triaxial Bard system was advanced into the biliary system. The metal stiffener and dilator were removed, we confirmed placement with contrast infusion. We then attempted to navigate through stenotic ductal system at the hilum, presumably the common hepatic duct. A coaxial Glidewire and 4 French glide cath were then used to attempt to navigate across the obstruction at the hilum of the liver. A combination of the glide cath, Glidewire, and a roadrunner wire were required to access the common hepatic duct. Once the catheter and wire were advanced into the external hepatic ducts, further images were acquired. This demonstrated that the duodenal diverticulum was distorting the anatomy at the ampulla. After some initial attempts fail to  cross the ampulla, a coon's wire was placed and then a 6 French 35 cm straight sheath was placed into the common bile duct. At this point we were able to navigate the angled diagnostic catheter and the Glidewire through the ampulla into the duodenum.  With the diagnostic catheter in the second portion the duodenum, the coon's wire was then placed. Twelve French dilation of the subcutaneous tissue tracks was performed, and then a 17 [place] biliary drain was placed as an internal/external biliary drain. Small amount of contrast confirmed location. The patient tolerated the procedure well and remained hemodynamically stable throughout. No complications were encountered and no significant blood loss was encountered. FINDINGS: Percutaneous transhepatic cholangiogram demonstrates significant stenotic narrowing in the bile ducts at the hilum of the liver, suspicious for malignancy/Klatskin tumor IMPRESSION: Status post image guided percutaneous transhepatic cholangiogram with internal/external biliary drain placement. Injection of the percutaneous cholecystostomy tube demonstrates that the cystic duct remains occluded. Signed, Yvone Neu. Miachel Roux, RPVI [facility] Radiology Electronically Signed   By: Gilmer Mor D.O.   On: 01/31/2023 16:46   DG C-Arm 1-60 Min-No Report  Result Date: 01/29/2023 Fluoroscopy was utilized by the requesting physician.  No radiographic interpretation.   MR ABDOMEN MRCP W WO CONTAST  Result Date: 01/24/2023 CLINICAL DATA:  Jaundice. History of acute cholecystitis status post percutaneous cholecystostomy tube placement 01/02/2023 EXAM: MRI ABDOMEN WITHOUT AND WITH CONTRAST (INCLUDING MRCP) TECHNIQUE: Multiplanar multisequence MR imaging of the abdomen was performed both before and after the administration of intravenous contrast. Heavily T2-weighted images of the biliary and pancreatic ducts were obtained. Post-processing was  applied at the acquisition scanner with concurrent physician supervision which includes 3D reconstructions, MIPs, volume rendered images and/or shaded surface rendering. CONTRAST:  8.13mL GADAVIST GADOBUTROL 1 MMOL/ML IV SOLN COMPARISON:  MR abdomen dated 12/29/2022 FINDINGS: Decreased sensitivity and specificity for detailed findings due to motion artifact. Lower chest: Trace bilateral pleural effusions. Hepatobiliary: Parenchymal signal abnormality. Scattered subcentimeter T2 hyperintense foci, likely cysts. Increased mild to moderate intrahepatic bile duct dilation more prominent in the left hepatic lobe with new mural thickening of the extrahepatic bile duct (4:16) and enhancement (1304:45). The common bile duct is nondilated. Gallbladder is decompressed with catheter in-situ and demonstrates circumferential mural thickening. Pancreas: No mass or inflammatory changes. 3 mm T2 hyperintense cystic focus arises directly from the main pancreatic duct in the pancreatic neck (6:20) keeping with side branch intraductal papillary mucinous neoplasms (IPMN). No main ductal dilation, mass lesion, or abnormal enhancement. Spleen:  Within normal limits in size and appearance. Adrenals/Urinary Tract: No adrenal nodules. No suspicious renal masses identified. No evidence of hydronephrosis. Stomach/Bowel: Small proximal duodenal diverticulum. Colonic diverticulosis without acute diverticulitis. Vascular/Lymphatic: No pathologically enlarged lymph nodes identified. No abdominal aortic aneurysm demonstrated. Other:  Trace ascites. Musculoskeletal: No suspicious bone lesions identified. Susceptibility artifact related to median sternotomy wires. IMPRESSION: 1. Increased mild to moderate intrahepatic bile duct dilation, more prominent in the left hepatic lobe, with new mural thickening and enhancement of the extrahepatic bile duct, suspicious for cholangitis. 2. Decompressed gallbladder with catheter in-situ and circumferential  mural thickening. 3. Trace bilateral pleural effusions and trace ascites. Electronically Signed   By: Agustin Cree M.D.   On: 01/24/2023 15:54   MR 3D Recon At Scanner  Result Date: 01/24/2023 CLINICAL DATA:  Jaundice. History of acute cholecystitis status post percutaneous cholecystostomy tube placement 01/02/2023 EXAM: MRI ABDOMEN WITHOUT AND WITH CONTRAST (INCLUDING MRCP) TECHNIQUE: Multiplanar multisequence MR imaging of the abdomen was performed both before and after the administration of intravenous contrast. Heavily T2-weighted images of the biliary and pancreatic ducts were obtained. Post-processing was applied at the acquisition scanner with concurrent physician supervision which includes 3D reconstructions, MIPs, volume rendered images and/or shaded surface  rendering. CONTRAST:  8.74mL GADAVIST GADOBUTROL 1 MMOL/ML IV SOLN COMPARISON:  MR abdomen dated 12/29/2022 FINDINGS: Decreased sensitivity and specificity for detailed findings due to motion artifact. Lower chest: Trace bilateral pleural effusions. Hepatobiliary: Parenchymal signal abnormality. Scattered subcentimeter T2 hyperintense foci, likely cysts. Increased mild to moderate intrahepatic bile duct dilation more prominent in the left hepatic lobe with new mural thickening of the extrahepatic bile duct (4:16) and enhancement (1304:45). The common bile duct is nondilated. Gallbladder is decompressed with catheter in-situ and demonstrates circumferential mural thickening. Pancreas: No mass or inflammatory changes. 3 mm T2 hyperintense cystic focus arises directly from the main pancreatic duct in the pancreatic neck (6:20) keeping with side branch intraductal papillary mucinous neoplasms (IPMN). No main ductal dilation, mass lesion, or abnormal enhancement. Spleen:  Within normal limits in size and appearance. Adrenals/Urinary Tract: No adrenal nodules. No suspicious renal masses identified. No evidence of hydronephrosis. Stomach/Bowel: Small proximal  duodenal diverticulum. Colonic diverticulosis without acute diverticulitis. Vascular/Lymphatic: No pathologically enlarged lymph nodes identified. No abdominal aortic aneurysm demonstrated. Other:  Trace ascites. Musculoskeletal: No suspicious bone lesions identified. Susceptibility artifact related to median sternotomy wires. IMPRESSION: 1. Increased mild to moderate intrahepatic bile duct dilation, more prominent in the left hepatic lobe, with new mural thickening and enhancement of the extrahepatic bile duct, suspicious for cholangitis. 2. Decompressed gallbladder with catheter in-situ and circumferential mural thickening. 3. Trace bilateral pleural effusions and trace ascites. Electronically Signed   By: Agustin Cree M.D.   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 Patient now with worsening hyperbilirubinemia and jaundice and as such presents for cholangiogram and potential cholecystostomy tube exchange. EXAM: FLUOROSCOPIC GUIDED CHOLECYSTOSTOMY TUBE EXCHANGE COMPARISON:  MRCP-12/29/2022 Image guided cholecystostomy tube placement-01/02/2023 MEDICATIONS: None ANESTHESIA/SEDATION: None CONTRAST:  15mL OMNIPAQUE IOHEXOL 300 MG/ML SOLN - administered into the gallbladder lumen. FLUOROSCOPY TIME:  1 minute, 6 seconds (6.1 mGy) COMPLICATIONS: None immediate. PROCEDURE: The patient was positioned supine on the fluoroscopy table. The external portion of the existing cholecystostomy tube as well as the surrounding skin was prepped and draped in usual sterile fashion. A time-out was performed prior to the initiation of the procedure. A preprocedural spot fluoroscopic image was obtained of the right upper abdominal quadrant existing cholecystostomy tube. The skin surrounding the cholecystostomy tube was anesthetized with 1% lidocaine  with epinephrine. The external portion of the cholecystostomy tube was cut and cannulated with a short Amplatz wire which was advanced through the tube and coiled within the gallbladder lumen Next, under intermittent fluoroscopic guidance, the existing 10 [place] cholecystostomy tube was exchanged for a new, slightly larger now 10 [place] cholecystostomy tube which was repositioned into the more central aspect of the gallbladder lumen. Contrast injection confirms appropriate positioning and functionality of the cholecystomy tube. The cholecystostomy tube was flushed with a small amount of saline and reconnected to a gravity bag. The cholecystostomy tube was secured with an interrupted suture and a Stat Lock device. A dressing was applied. The patient tolerated the procedure well without immediate postprocedural complication. FINDINGS: Preprocedural spot fluoroscopic image demonstrates unchanged positioning of cholecystostomy tube with end coiled and locked over the expected location of the fundus of the gallbladder. Contrast injection demonstrates appropriate positioning and functionality of the existing cholecystostomy tube. There is opacification of the peripheral aspect of the cystic duct without passage of contrast to the level of the CBD. After  fluoroscopic guided exchange, the new, 10 [place] cholecystostomy tube is appropriately positioned within the gallbladder lumen. Post exchange cholangiogram demonstrates appropriate positioning and functionality of the new cholecystostomy tube. IMPRESSION: Successful fluoroscopic guided exchange of 10 French cholecystostomy tube. PLAN: - Cholangiogram demonstrates persistent occlusion of the cystic duct without opacification of the CBD. 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 The patient is visibly jaundiced with worsening hyperbilirubinemia (bilirubin - 3.9 on 11/11, 0.6 on the day of  cholecystostomy tube placement) and as such the decision was made to escort the patient to the emergency department for further evaluation and management as the patient will likely require an ERCP to relieve his worsening biliary obstruction. Electronically Signed   By: Simonne Come M.D.   On: 01/24/2023 11:06    Addendum I have seen the patient, examined him. I agree with the assessment and and plan and have edited the notes.   I reviewed the cytology from biliary brushing which confirmed adenocarcinoma.  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  I will complete staging with a CT chest.  I discussed the diagnosis and treatment options with patient and his wife and son in detail.  Unfortunately surgical resection is challenge due to the location of the tumor, I have reviewed the case with our pancreatobiliary surgeon Dr. Donell Beers, and she recommends referral to tertiary cancer center such as [facility] or [facility].  Patient's wife has called Dr. Freida Busman at the [facility] as a self-referral, and they will schedule his consult after his hospital discharge.  Due to his advanced age, severe jaundice, and severe malnutrition, he is a poor candidate for surgery.  We reviewed nutritional supplement, and I strongly encouraged him to increase oral intake of Ensure or boost, patient is able to swallow and no significant nausea, I do not think he needs a feeding tube.  He unfortunately is not a candidate for TPN due to the bacteremia.  His total bilirubin has not come down since his last biliary draining tube placement on February 07, 2023.  I have reached out to IR Dr. Loreta Ave to see if they have anything else they can offer.  I also reviewed the option of radiation and oral chemotherapy at the palliative treatment option, if he is not a candidate for surgery.  All questions were answered.  I spent a total of 50 minutes for his visit today, more than 50% time on  face-to-face counseling.  Malachy Mood MD 02/12/2023

## 2023-02-12 NOTE — Plan of Care (Signed)

## 2023-02-13 ENCOUNTER — Inpatient Hospital Stay (HOSPITAL_COMMUNITY): Payer: Medicare Other

## 2023-02-13 DIAGNOSIS — K831 Obstruction of bile duct: Secondary | ICD-10-CM | POA: Diagnosis not present

## 2023-02-13 DIAGNOSIS — R17 Unspecified jaundice: Secondary | ICD-10-CM | POA: Diagnosis not present

## 2023-02-13 HISTORY — PX: IR CHOLANGIOGRAM EXISTING TUBE: IMG6040

## 2023-02-13 HISTORY — PX: IR EXCHANGE BILIARY DRAIN: IMG6046

## 2023-02-13 LAB — COMPREHENSIVE METABOLIC PANEL
ALT: 102 U/L — ABNORMAL HIGH (ref 0–44)
AST: 109 U/L — ABNORMAL HIGH (ref 15–41)
Albumin: 1.8 g/dL — ABNORMAL LOW (ref 3.5–5.0)
Alkaline Phosphatase: 293 U/L — ABNORMAL HIGH (ref 38–126)
Anion gap: 12 (ref 5–15)
BUN: 42 mg/dL — ABNORMAL HIGH (ref 8–23)
CO2: 21 mmol/L — ABNORMAL LOW (ref 22–32)
Calcium: 8.9 mg/dL (ref 8.9–10.3)
Chloride: 96 mmol/L — ABNORMAL LOW (ref 98–111)
Creatinine, Ser: 1.18 mg/dL (ref 0.61–1.24)
GFR, Estimated: >60 mL/min (ref >60)
Glucose, Bld: 116 mg/dL — ABNORMAL HIGH (ref 70–99)
Potassium: 4 mmol/L (ref 3.5–5.1)
Sodium: 129 mmol/L — ABNORMAL LOW (ref 135–145)
Total Bilirubin: 12.2 mg/dL — ABNORMAL HIGH (ref <1.2)
Total Protein: 5.8 g/dL — ABNORMAL LOW (ref 6.5–8.1)

## 2023-02-13 LAB — MRSA NEXT GEN BY PCR, NASAL: MRSA by PCR Next Gen: NOT DETECTED

## 2023-02-13 MED ORDER — SODIUM CHLORIDE 0.9% FLUSH
10.0000 mL | Freq: Three times a day (TID) | INTRAVENOUS | Status: DC
  Administered 2023-02-13 – 2023-02-24 (×31): 10 mL

## 2023-02-13 MED ORDER — SODIUM CHLORIDE 0.9 % IV SOLN
INTRAVENOUS | Status: AC | PRN
  Administered 2023-02-13: 1 g via INTRAVENOUS

## 2023-02-13 MED ORDER — ENSURE ENLIVE PO LIQD
237.0000 mL | Freq: Four times a day (QID) | ORAL | Status: DC
Start: 2023-02-13 — End: 2023-02-24
  Administered 2023-02-13 – 2023-02-24 (×37): 237 mL via ORAL

## 2023-02-13 MED ORDER — SODIUM CHLORIDE 0.9% FLUSH
5.0000 mL | Freq: Three times a day (TID) | INTRAVENOUS | Status: DC
  Administered 2023-02-13 – 2023-02-24 (×32): 5 mL

## 2023-02-13 MED ORDER — LIDOCAINE HCL 1 % IJ SOLN
INTRAMUSCULAR | Status: AC
Start: 2023-02-13 — End: ?
  Filled 2023-02-13: qty 20

## 2023-02-13 MED ORDER — LIDOCAINE HCL 1 % IJ SOLN
20.0000 mL | Freq: Once | INTRAMUSCULAR | Status: AC
  Administered 2023-02-13: 10 mL via INTRADERMAL
  Filled 2023-02-13: qty 20

## 2023-02-13 MED ORDER — MIDAZOLAM HCL 2 MG/2ML IJ SOLN
INTRAMUSCULAR | Status: AC
  Filled 2023-02-13: qty 2

## 2023-02-13 MED ORDER — MIDAZOLAM HCL 2 MG/2ML IJ SOLN
INTRAMUSCULAR | Status: AC | PRN
  Administered 2023-02-13: 1 mg via INTRAVENOUS

## 2023-02-13 MED ORDER — SODIUM CHLORIDE 0.9 % IV BOLUS
500.0000 mL | Freq: Once | INTRAVENOUS | Status: AC
  Administered 2023-02-13: 500 mL via INTRAVENOUS

## 2023-02-13 MED ORDER — FENTANYL CITRATE (PF) 100 MCG/2ML IJ SOLN
INTRAMUSCULAR | Status: AC
  Filled 2023-02-13: qty 2

## 2023-02-13 MED ORDER — SODIUM CHLORIDE 0.9 % IV SOLN
INTRAVENOUS | Status: AC
  Filled 2023-02-13: qty 20

## 2023-02-13 MED ORDER — IOHEXOL 300 MG/ML  SOLN
50.0000 mL | Freq: Once | INTRAMUSCULAR | Status: AC | PRN
  Administered 2023-02-13: 25 mL

## 2023-02-13 MED ORDER — FENTANYL CITRATE (PF) 100 MCG/2ML IJ SOLN
INTRAMUSCULAR | Status: AC | PRN
  Administered 2023-02-13: 50 ug via INTRAVENOUS

## 2023-02-13 NOTE — Procedures (Signed)
Pre procedural Dx: Concern for cholangiocarcinoma/Klatskin tumor Post procedural Dx: Same  Successful flouro guided exchange and up sizing of now 14 Fr RIGHT sided biliary drain with end coiled and locked within the duodenum. Successful flouro guided exchange and up sizing of now 12 Fr LEFT sided biliary drain with end coiled and locked within the duodenum.  Appropriate positioning and functionality of chole tube with persistent obstruction of the cystic duct. No exchange performed.  All drainage catheters were re-connected to gravity bags.  EBL: Trace Complications: None immediate  PLAN: - Recommend obtaining daily CMPs.  - IF patient's bilirubin levels do not improve in the coming days, recommend further evaluation with repeat CT of the abdomen or MRCP.  Katherina Right, MD Pager #: [phone]

## 2023-02-13 NOTE — Progress Notes (Signed)
Referring Physician(s): Dr Adrian Saran  Supervising Physician: Gilmer Mor  Patient Status:  [facility] - In-pt  Chief Complaint:  Biliary obstruction   Subjective:  Known to IR S/p perc chole placement on 10/31; exchanged 11/22 F/u cholangiogram on 11/22 showed persistent occlusion of the cystic duct without opacification of the CBD. Admitted on 11/22 with elevated T bili and jaundice, ERCP was unsuccessful  S/p int/ext bili drain placement in IR on 11/29 Drains are able to flush easily Tbili remains high:  12.9; 12.5; 13.9; 15.6  Dr Mosetta Putt request evaluation with IR Dr Loreta Ave has reviewed imaging and chart--- planned for Biliary drain evaluation and possible upsize/exchange    Allergies: Other  Medications: Prior to Admission medications   Medication Sig Start Date End Date Taking? Authorizing Provider  amLODipine (NORVASC) 5 MG tablet TAKE 1 TABLET (5 MG TOTAL) BY MOUTH DAILY. 01/11/22  Yes [name], [name], MD  Cholecalciferol (VITAMIN D-3 PO) Take 1 capsule by mouth every evening.   Yes [provider]  divalproex (DEPAKOTE) 250 MG DR tablet Take 1 tablet (250 mg total) by mouth 2 (two) times daily. Take in addition to 500 mg twice a day (pt should have a total of 750 mg twice a day) 08/26/22  Yes Micki Riley, MD  divalproex (DEPAKOTE) 500 MG DR tablet Take 1 tablet (500 mg total) by mouth 2 (two) times daily. 05/20/22  Yes Micki Riley, MD  ELIQUIS 5 MG TABS tablet TAKE 1 TABLET BY MOUTH TWICE A DAY 08/07/22  Yes Mealor, Roberts Gaudy, MD  gabapentin (NEURONTIN) 300 MG capsule Take 1 capsule (300 mg total) by mouth at bedtime. 09/16/22  Yes Micki Riley, MD  ibandronate (BONIVA) 150 MG tablet TAKE 1 TABLET BY MOUTH EVERY 30 DAYS. TAKE IN AM WITH FULL GLASS OF WATER ON EMPTY STOMACH AND DON'T TAKE ANYTHING ELSE BY MOUTH OR LIE DOWN FOR THE NEXT 30 MINUTES Patient taking differently: Take 150 mg by mouth See admin instructions. Take 1 tablet (150mg ) once a month around  the 20th. 07/22/22  Yes [name], [name], MD  LUTEIN PO Take 1 tablet by mouth every evening.   Yes [provider]  metoprolol succinate (TOPROL-XL) 25 MG 24 hr tablet TAKE 1 TABLET (25 MG TOTAL) BY MOUTH DAILY. 09/23/22  Yes BranchAlben Spittle, MD  Multiple Vitamin (MULTIVITAMIN WITH MINERALS) TABS tablet Take 1 tablet by mouth every evening.   Yes [provider]  OVER THE COUNTER MEDICATION Take 1 tablet by mouth 2 (two) times daily. Bone Up   Yes [provider]  amoxicillin-clavulanate (AUGMENTIN) 875-125 MG tablet Take 1 tablet by mouth 2 (two) times daily. Patient not taking: Reported on 01/24/2023    [provider]     Vital Signs: BP 109/85 (BP Location: Right Arm)   Pulse 82   Temp 98.1 F (36.7 C) (Oral)   Resp 20   Ht 5\' 11"  (1.803 m)   Wt 184 lb (83.5 kg)   SpO2 96%   BMI 25.66 kg/m   Physical Exam HENT:     Mouth/Throat:     Mouth: Mucous membranes are moist.  Cardiovascular:     Rate and Rhythm: Normal rate and regular rhythm.  Pulmonary:     Breath sounds: Normal breath sounds.  Abdominal:     Palpations: Abdomen is soft.  Skin:    Comments: Drains all intact (3) Sites are clean and dry No infection OP bile  Neurological:  Mental Status: He is alert and oriented to person, place, and time.  Psychiatric:        Behavior: Behavior normal.     Imaging: No results found.  Labs:  CBC: Recent Labs    02/07/23 0204 02/08/23 0158 02/09/23 0207 02/11/23 0207 02/12/23 0217  WBC 7.2 13.1* 11.0* 8.9  --   HGB 9.3* 9.5* 8.9* 8.1* 9.1*  HCT 28.4* 28.3* 26.8* 23.8* 27.1*  PLT 241 239 274 328  --     COAGS: Recent Labs    12/30/22 2151 12/31/22 0705 12/31/22 1747 01/01/23 1154 01/25/23 0548 01/31/23 0516  INR 1.3*  --   --   --  1.2 1.1  APTT 36 34 61* 43*  --   --     BMP: Recent Labs    02/09/23 0207 02/10/23 0203 02/11/23 0207 02/12/23 0217  NA 134* 130* 130* 128*  K 3.8 4.1 3.6 4.0  CL 102 98  96* 97*  CO2 21* 21* 22 21*  GLUCOSE 119* 89 86 104*  BUN 34* 40* 35* 36*  CALCIUM 8.4* 8.2* 8.1* 8.6*  CREATININE 1.17 1.26* 0.99 1.12  GFRNONAA >60 58* >60 >60    LIVER FUNCTION TESTS: Recent Labs    02/09/23 0207 02/10/23 0203 02/11/23 0207 02/12/23 0217  BILITOT 15.6* 13.9* 12.5* 12.9*  AST 71* 70* 74* 89*  ALT 101* 87* 81* 93*  ALKPHOS 217* 193* 187* 242*  PROT 5.2* 5.3* 4.7* 5.6*  ALBUMIN 1.7* 1.6* <1.5* 1.7*    Assessment and Plan:  Scheduled for Biliary drain evaluation/upsize/exchange Pt and wife aware of procedure benefits and risks Including but not limited to infection; bleeding; damage to surrounding to structures Agreeable to proceed Consent signed and in chart  Electronically Signed: Robet Leu, PA-C 02/13/2023, 10:17 AM   I spent a total of 25 Minutes at the the patient's bedside AND on the patient's hospital floor or unit, greater than 50% of which was counseling/coordinating care for Biliary obstruction

## 2023-02-13 NOTE — Progress Notes (Signed)
Physical Therapy Treatment Patient Details Name: Samuel Mayo MRN: [id number] DOB: 11/11/1945 Today's Date: 02/13/2023   History of Present Illness 77 y.o. male [facility]24/23 with worsening jaundice and follow up imaging from recent admit that showed non-visualization of CBD. MRI/MRCP showed increased mild to moderate intrahepatic bile duct dilation and new mural thickening and enhancement of the extrahepatic bile duct suspicious for cholangitis. IR performed PTC 11/29 with placement of internal/external drain. 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 Recent admit from 10/28 -11/1 for cholecystitis, diagnosed with Mirizzi syndrome ,underwent cholecystostomy. PMHx: seizure disorder on depakote, PAF, HTN, a-fib, acute biliary pancreatitis, cholecystitis, gout    PT Comments  Pt received in supine, agreeable to therapy session with encouragement, spouse present and encouraging him. Pt with slow processing and needs increased time and some multimodal cues for supine  and seated BLE exercises for strengthening, handout given to reinforce technique. Pt with symptomatic orthostatic hypotension (also NPO today in anticipation of procedure) and would benefit from BLE compression next session to see if standing BP more stable, which may help in building standing/activity tolerance. Pt needing up to minA for transfers and dynamic standing tasks with RW support due to posterior instability. Pt will continue to benefit from skilled rehab in a post acute setting < 3 hours per day to maximize functional gains before returning home.   Vital Signs  Patient Position (if appropriate) Orthostatic Vitals  Orthostatic Lying   BP- Lying 104/74  Pulse- Lying 85  Orthostatic Sitting  BP- Sitting 106/79  Pulse- Sitting 90  Orthostatic Standing at 0 minutes  BP- Standing at 0 minutes (!) 84/69  Pulse- Standing at 0 minutes 95  Orthostatic Standing at 3 minutes  BP- Standing at 3 minutes  (!) 88/72  Pulse- Standing at 3 minutes 102   Orthostatic Sitting (after standing reading taken, pt sitting in chair)  BP- Sitting 117/82  Pulse- Sitting 93     If plan is discharge home, recommend the following: A little help with walking and/or transfers;Help with stairs or ramp for entrance;Assist for transportation   Can travel by private vehicle     Yes  Equipment Recommendations  Rollator (4 wheels) (vs RW pending progress)    Recommendations for Other Services       Precautions / Restrictions Precautions Precautions: Fall Precaution Comments: biliary tubes x 3 RUQ, orthostatic hypotension Restrictions Weight Bearing Restrictions Per Provider Order: No     Mobility  Bed Mobility Overal bed mobility: Needs Assistance Bed Mobility: Supine to Sit, Sit to Supine     Supine to sit: Contact guard Sit to supine: Contact guard assist        Transfers Overall transfer level: Needs assistance Equipment used: Rolling walker (2 wheels) Transfers: Sit to/from Stand, Bed to chair/wheelchair/BSC Sit to Stand: Min assist   Step pivot transfers: Contact guard assist       General transfer comment: Cues for hand placement and safety, pt initially attempting to pull up on RW handles. Needs reminder also to reach back prior to sitting, decreased eccentric control to sit.    Ambulation/Gait Ambulation/Gait assistance: Min assist   Assistive device: Rolling walker (2 wheels)       Pre-gait activities: hip flexion x10 reps at RW (minA due to posterior instability during exercise) General Gait Details: limited to step pivot due to symptomatic orthostatic hypotension, RN/MD notified of BP readings   Stairs  Wheelchair Mobility     Tilt Bed    Modified Rankin (Stroke Patients Only)       Balance Overall balance assessment: Needs assistance Sitting-balance support: Feet supported Sitting balance-Leahy Scale: Good     Standing balance  support: Bilateral upper extremity supported Standing balance-Leahy Scale: Poor Standing balance comment: heavy reliance on RW, fair static standing but poor dynamic standing during standing hip flexion exercise, needing up to minA                            Cognition Arousal: Alert Behavior During Therapy: Flat affect Overall Cognitive Status: Impaired/Different from baseline Area of Impairment: Memory, Following commands, Safety/judgement, Problem solving, Awareness                 Orientation Level: Disoriented to, Time   Memory: Decreased short-term memory Following Commands: Follows one step commands consistently, Follows one step commands with increased time Safety/Judgement: Decreased awareness of safety, Decreased awareness of deficits Awareness: Emergent Problem Solving: Slow processing General Comments: Slow processing, esp while standing, pt often takes a long time to answer direct questions and at times needs repetition of question, per spouse he is not HoH.        Exercises Other Exercises Other Exercises: supine BLE AROM: ankle pumps, quad sets, heel slides, hip abduction and ADduction pillow squeezes x5-10 reps ea for teachback Other Exercises: seated BLE AROM: LAQ Other Exercises: standing BLE AROM: hip flexion x10 reps ea at RW    General Comments General comments (skin integrity, edema, etc.): BLE edema, jaundiced skin tone      Pertinent Vitals/Pain Pain Assessment Pain Assessment: Faces Faces Pain Scale: Hurts a little bit Pain Location: abdomen Pain Descriptors / Indicators: Guarding Pain Intervention(s): Limited activity within patient's tolerance, Monitored during session, Repositioned (no significant c/o, slow to initiate movements, some guarding)    Home Living                          Prior Function            PT Goals (current goals can now be found in the care plan section) Acute Rehab PT Goals PT Goal  Formulation: With patient/family Time For Goal Achievement: 02/15/23 Progress towards PT goals: Progressing toward goals    Frequency    Min 1X/week      PT Plan         AM-PAC PT "6 Clicks" Mobility   Outcome Measure  Help needed turning from your back to your side while in a flat bed without using bedrails?: A Little Help needed moving from lying on your back to sitting on the side of a flat bed without using bedrails?: A Little Help needed moving to and from a bed to a chair (including a wheelchair)?: A Little Help needed standing up from a chair using your arms (e.g., wheelchair or bedside chair)?: A Little Help needed to walk in hospital room?: Total (<58ft, orthostatic hypotension) Help needed climbing 3-5 steps with a railing? : Total 6 Click Score: 14    End of Session Equipment Utilized During Treatment: Gait belt Activity Tolerance: Patient tolerated treatment well;Patient limited by fatigue;Other (comment);Treatment limited secondary to medical complications (Comment) (symptomatic orthostatic hypotension) Patient left: in chair;with call bell/phone within reach;with family/visitor present (no chair alarm in room, RN notified; spouse states she will stay with him while he is up in chair) Nurse Communication: Mobility  status;Other (comment) (orthostatic hypotension, pt asking for chapstick) PT Visit Diagnosis: Other abnormalities of gait and mobility (R26.89);Muscle weakness (generalized) (M62.81)     Time: 8295-6213 PT Time Calculation (min) (ACUTE ONLY): 24 min  Charges:    $Therapeutic Exercise: 8-22 mins $Therapeutic Activity: 8-22 mins PT General Charges $$ ACUTE PT VISIT: 1 Visit                     Syrah Daughtrey P., PTA Acute Rehabilitation Services Secure Chat Preferred 9a-5:30pm Office: [phone]    Dorathy Kinsman [facility] 02/13/2023, 2:42 PM

## 2023-02-13 NOTE — Progress Notes (Signed)
[facility]  Date of Admission:  01/24/2023     Total days of antibiotics 19         ASSESSMENT:  Mr. 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 Tolerating previously prescribed levofloxacin and metronidazole. Anticipate bacteremia has been cleared at this point with new blood cultures drawn this morning and pending. IR asked to evaluate drains given continued elevated bilirubin. Would be appropriate for PICC line if needed for TPN purposes. Recommend continuation of previous plan of care with 10 days of levofloxacin and metronidazole with end of treatment planned for 02/20/23. Adenocarcinoma treatment per Oncology with remaining medical and supportive care per Internal Medicine.   PLAN:  Continue current dose of levofloxacin and metronidazole through 02/20/23.  Percutaneous drain management per IR.  Adenocarcinoma treatment per Oncology.  Remaining medical and supportive care per Internal Medicine.   ID will sign off. Please re-consult if needed.   Principal Problem:   Obstructive jaundice Active Problems:   Hypertension   Atrial fibrillation, chronic (HCC)   Seizure (HCC)   Cholecystitis   Acute biliary pancreatitis   Malnutrition of moderate degree   Hyperbilirubinemia   DNR (do not resuscitate)   DNR (do not resuscitate) discussion   Palliative care by specialist   Weakness generalized    (feeding supplement) PROSource Plus  30 mL Oral BID BM   sodium chloride   Intravenous Once   apixaban  5 mg Oral BID   divalproex  750 mg Oral BID   feeding supplement  237 mL Oral QID   metroNIDAZOLE  500 mg Oral Q12H   midodrine  10 mg Oral TID WC   multivitamin with minerals  1 tablet Oral Daily   sodium chloride flush  3 mL Intravenous Q12H   sodium chloride flush  5 mL Intracatheter Q8H     SUBJECTIVE:  Afebrile overnight with no acute events. Wife at bedside.   Allergies  Allergen Reactions   Other Other (See Comments)    Childhood reaction to Neosporin Ophthalmic ointment caused severe eye redness, irritation     Review of Systems: Review of Systems  Constitutional:  Negative for chills, fever and weight loss.  Respiratory:  Negative for cough, shortness of breath and wheezing.   Cardiovascular:  Negative for chest pain and leg swelling.  Gastrointestinal:  Negative for abdominal pain, constipation, diarrhea, nausea and vomiting.  Skin:  Negative for rash.      OBJECTIVE: Vitals:   02/12/23 2319 02/13/23 0255 02/13/23 0740 02/13/23 1055  BP: 116/67 113/84 109/85 110/78  Pulse: 79 91 82 98  Resp: 15 17 20 19   Temp: 98 F (36.7 C)  98.1 F (36.7 C) 98.1 F (36.7 C)  TempSrc: Oral  Oral Oral  SpO2: 96% 97% 96% 97%  Weight:      Height:       Body mass index is 25.66 kg/m.  Physical Exam Constitutional:      General: He is not in acute distress.    Appearance: He is well-developed.  Cardiovascular:     Rate and Rhythm: Normal rate and regular rhythm.     Heart sounds: Normal heart sounds.  Pulmonary:     Effort: Pulmonary effort is normal.     Breath sounds: Normal breath sounds.  Skin:    General: Skin is warm and dry.     Coloration: Skin is jaundiced.  Neurological:     Mental Status: He is alert and oriented to person, place, and time.     Lab Results Lab Results  Component Value Date   WBC 8.9 02/11/2023   HGB 9.1 (L) 02/12/2023   HCT 27.1 (L) 02/12/2023   MCV 103.5 (H) 02/11/2023   PLT 328 02/11/2023    Lab Results  Component Value Date   CREATININE 1.18 02/13/2023   BUN 42 (H) 02/13/2023   NA 129 (L) 02/13/2023   K 4.0 02/13/2023   CL 96 (L) 02/13/2023   CO2 21 (L) 02/13/2023    Lab Results  Component Value Date   ALT 102 (H) 02/13/2023   AST 109 (H) 02/13/2023   ALKPHOS 293 (H) 02/13/2023   BILITOT 12.2 (H)  02/13/2023     Microbiology: Recent Results (from the past 240 hours)  Culture, blood (Routine X 2) w Reflex to ID Panel     Status: Abnormal   Collection Time: 02/07/23  2:28 PM   Specimen: BLOOD  Result Value Ref Range Status   Specimen Description BLOOD SITE NOT SPECIFIED  Final   Special Requests   Final    BOTTLES DRAWN AEROBIC ONLY Blood Culture adequate volume   Culture  Setup Time   Final    GRAM NEGATIVE RODS AEROBIC BOTTLE ONLY CRITICAL RESULT CALLED TO, READ BACK BY AND VERIFIED WITH: Meribeth Mattes [id number] AT 1356 BY EC Performed at [facility], [address] [place], [place] [address]    Culture PSEUDOMONAS AERUGINOSA (A)  Final   Report Status 02/10/2023 FINAL  Final   Organism ID, Bacteria PSEUDOMONAS AERUGINOSA  Final      Susceptibility   Pseudomonas aeruginosa - MIC*    CEFTAZIDIME 16 INTERMEDIATE Intermediate     CIPROFLOXACIN <=0.25 SENSITIVE Sensitive     GENTAMICIN 2 SENSITIVE Sensitive     IMIPENEM 1 SENSITIVE Sensitive     CEFEPIME 16 RESISTANT Resistant     * PSEUDOMONAS AERUGINOSA  Blood Culture ID Panel (Reflexed)     Status: Abnormal   Collection Time: 02/07/23  2:28 PM  Result Value Ref Range Status   Enterococcus faecalis NOT DETECTED NOT DETECTED Final   Enterococcus Faecium NOT DETECTED NOT DETECTED Final   Listeria monocytogenes NOT DETECTED NOT DETECTED Final   Staphylococcus species NOT DETECTED NOT DETECTED Final   Staphylococcus aureus (BCID) NOT DETECTED NOT DETECTED Final   Staphylococcus epidermidis NOT DETECTED NOT DETECTED Final   Staphylococcus lugdunensis NOT DETECTED NOT DETECTED Final   Streptococcus species NOT DETECTED NOT DETECTED Final   Streptococcus agalactiae NOT DETECTED NOT DETECTED Final   Streptococcus pneumoniae NOT DETECTED NOT DETECTED Final   Streptococcus pyogenes NOT DETECTED NOT DETECTED Final   A.calcoaceticus-baumannii NOT DETECTED NOT DETECTED Final   Bacteroides fragilis NOT DETECTED NOT  DETECTED Final   Enterobacterales NOT DETECTED NOT DETECTED Final   Enterobacter cloacae complex NOT DETECTED NOT DETECTED Final   Escherichia coli NOT DETECTED NOT DETECTED Final   Klebsiella aerogenes NOT DETECTED NOT DETECTED Final   Klebsiella oxytoca NOT DETECTED NOT DETECTED Final   Klebsiella pneumoniae NOT DETECTED NOT DETECTED Final   Proteus species NOT DETECTED NOT DETECTED Final   Salmonella species NOT DETECTED NOT DETECTED Final   Serratia marcescens NOT DETECTED NOT DETECTED Final   Haemophilus influenzae NOT DETECTED NOT DETECTED Final   Neisseria meningitidis NOT DETECTED NOT DETECTED Final   Pseudomonas aeruginosa DETECTED (A) NOT DETECTED Final    Comment: CRITICAL RESULT  CALLED TO, READ BACK BY AND VERIFIED WITH: PHARMD NATHAN GOAD [id number] AT 1356 BY EC    Stenotrophomonas maltophilia NOT DETECTED NOT DETECTED Final   Candida albicans NOT DETECTED NOT DETECTED Final   Candida auris NOT DETECTED NOT DETECTED Final   Candida glabrata NOT DETECTED NOT DETECTED Final   Candida krusei NOT DETECTED NOT DETECTED Final   Candida parapsilosis NOT DETECTED NOT DETECTED Final   Candida tropicalis NOT DETECTED NOT DETECTED Final   Cryptococcus neoformans/gattii NOT DETECTED NOT DETECTED Final   CTX-M ESBL NOT DETECTED NOT DETECTED Final   Carbapenem resistance IMP NOT DETECTED NOT DETECTED Final   Carbapenem resistance KPC NOT DETECTED NOT DETECTED Final   Carbapenem resistance NDM NOT DETECTED NOT DETECTED Final   Carbapenem resistance VIM NOT DETECTED NOT DETECTED Final    Comment: Performed at [facility], [address] [place], [place] [address]  Culture, blood (Routine X 2) w Reflex to ID Panel     Status: Abnormal   Collection Time: 02/07/23  2:30 PM   Specimen: BLOOD  Result Value Ref Range Status   Specimen Description BLOOD SITE NOT SPECIFIED  Final   Special Requests   Final    BOTTLES DRAWN AEROBIC ONLY Blood Culture adequate volume   Culture  Setup  Time GRAM NEGATIVE RODS AEROBIC BOTTLE ONLY   Final   Culture (A)  Final    PSEUDOMONAS AERUGINOSA SUSCEPTIBILITIES PERFORMED ON PREVIOUS CULTURE WITHIN THE LAST 5 DAYS. Performed at [facility], [address] [place], [place] [address]    Report Status 02/10/2023 FINAL  Final  Culture, blood (Routine X 2) w Reflex to ID Panel     Status: None (Preliminary result)   Collection Time: 02/13/23 11:22 AM   Specimen: BLOOD LEFT HAND  Result Value Ref Range Status   Specimen Description BLOOD LEFT HAND  Final   Special Requests   Final    BOTTLES DRAWN AEROBIC AND ANAEROBIC Blood Culture results may not be optimal due to an inadequate volume of blood received in culture bottles Performed at [facility], [address], [place], [place] [address]    Culture PENDING  Incomplete   Report Status PENDING  Incomplete     Marcos Eke, NP [facility]  02/13/2023  12:15 PM

## 2023-02-13 NOTE — Progress Notes (Signed)
PROGRESS NOTE Samuel Mayo  WUJ:[id number] DOB: 1945/04/29 DOA: 01/24/2023 PCP: [name], [name], MD  Brief Narrative/Hospital Course: [id number] w/ Hx A-fib on Eliquis, HTN, seizure disorder, MVR, recent acute cholecystitis with abscess s/p cholecystostomy drain on 10/31 , exchanged 11/22 got admitted with obstructive jaundice on 11/22 with persistent occlusion of the cystic duct without opacification of the CBD. Seen by GI s/ p ERCP but unsuccessful and IR did PTC with biliary drain placement on 11/29.  On 12/1 became febrile hypotensive tachycardic-CCM and palliative care consulted. Left sided drain placement by IR on 12/6.  12/6 growing Pseudomonas aeruginosa and blood culture CA 19-9 elevated AFP normal oncology consulted.  Seen by ID advised Levaquin oral on discharge x 10 days  and outpatient follow-up 12/11: Currently with 3 drains: 1 cholecystostomy and 2 biliary drains-flushing once daily.    Subjective:  Patient seen and examined this morning  Seen comfortably on the bedside chair on room air afebrile overnight Labs CMP pending   Assessment and Plan: Principal Problem:   Obstructive jaundice Active Problems:   Atrial fibrillation, chronic (HCC)   Hypertension   Seizure (HCC)   Cholecystitis   Acute biliary pancreatitis   Malnutrition of moderate degree   Hyperbilirubinemia   DNR (do not resuscitate)   DNR (do not resuscitate) discussion   Palliative care by specialist   Weakness generalized   Obstructive jaundice Possible biliary malignancy-CA 19-9 elevated 939,AFP normal: ERCP unsuccessful IR placed drains currently has 1 cholecystostomy tube and 2 biliary drains, flushing daily-will need follow-up with drain clinic with IR-?  If needs to upsize drain IR to eval. Patient was thought to have Kl[place]kin tumor for which surgery is not offered here and plan was to refer to [facility], LFTs and bilirubin elevated, surgery following, awaiting cytology from biliary brushing, follow-up  with [facility] oncology/[facility] oncology outpatient. Fu lasb today Recent Labs  Lab 02/07/23 0204 02/08/23 0158 02/09/23 0207 02/10/23 0203 02/11/23 0207 02/12/23 0217 02/13/23 1122  AST 112* 125* 71* 70* 74* 89* 109*  ALT 116* 130* 101* 87* 81* 93* 102*  ALKPHOS 276* 284* 217* 193* 187* 242* 293*  BILITOT 14.7* 17.0* 15.6* 13.9* 12.5* 12.9* 12.2*  PROT 5.6* 5.5* 5.2* 5.3* 4.7* 5.6* 5.8*  ALBUMIN 1.8* 1.7* 1.7* 1.6* <1.5* 1.7* 1.8*  PLT 241 239 274  --  328  --   --    Sepsis secondary to cholangitis-febrile on 11/13 with leukocytosis Pseudomonas bacteremia Recent acute cholecystitis with abscess s/p cholecystostomy drain on 10/31 , exchanged 11/22: ID advised Levaquin oral on discharge x 10 days  and outpatient follow-up.  Currently afebrile, no leukocytosis. Wife concerned about ongoing bacteremia from 12/6, repeating blood cultures today ID to see the patient again today.  Macrocytic anemia: Transfuse as needed.  Follow-up with oncology trend labs here. Recent Labs  Lab 02/07/23 0204 02/08/23 0158 02/09/23 0207 02/11/23 0207 02/12/23 0217  HGB 9.3* 9.5* 8.9* 8.1* 9.1*  HCT 28.4* 28.3* 26.8* 23.8* 27.1*    Hyponatremia Metabolic acidosis Dehydration AKI-resolved: Sodium on lower side encourage oral hydration, monitor electrolytes.    Hypertension Hypotension: Stable blood pressure. PTA on  amlodipine, Toprol currently on hold due to hypotension. Continue midodrine Addendum:Patient was orthostatic this afternoon w/ PTOT, will give additional 500 cc bolus, add compression stocking already on midodrine  Paroxysmal A-fib: Rate controlled continue Eliquis Toprol on hold    Seizure disorder:  Stable continue Depakote. Elevated liver enzymes is not from Depakote but from obstructive liver disease.   History  of mitral valve regurgitation:  S/p annuloplasty.  Follow-up with cardiology as an outpatient.   Goals of care: Palliative care, critical care was involved in the care.   Patient might have underlying biliary tumor which looks like unresectable as per general surgery.  Poor prognosis.  CODE STATUS changed to DNR.Might be a candidate for hospice/comfort care if further declines.     Deconditioning/debility:  Continue PT OT plan for skilled nursing facility at this time  Severe malnutrition : Augment diet as below  Etiology: acute illness,  Start Remeron 7.5 mg daily  DVT prophylaxis: SCDs Start: 01/24/23 1337 Code Status:   Code Status: Limited: Do not attempt resuscitation (DNR) -DNR-LIMITED -Do Not Intubate/DNI  Family Communication: plan of care discussed with patient/wife at bedside. Patient status is: Remains hospitalized because of severity of illness Level of care: Progressive   Dispo: The patient is from: home            Anticipated disposition: snf once bacteremia better  Objective: Vitals last 24 hrs: Vitals:   02/12/23 2319 02/13/23 0255 02/13/23 0740 02/13/23 1055  BP: 116/67 113/84 109/85 110/78  Pulse: 79 91 82 98  Resp: 15 17 20 19   Temp: 98 F (36.7 C)  98.1 F (36.7 C) 98.1 F (36.7 C)  TempSrc: Oral  Oral Oral  SpO2: 96% 97% 96% 97%  Weight:      Height:       Weight change:   Physical Examination: General exam: alert awake, oriented at baseline, older than stated age HEENT:Oral mucosa moist, Ear/Nose WNL grossly Respiratory system: Bilaterally clear BS,no use of accessory muscle Cardiovascular system: S1 & S2 +, No JVD. Gastrointestinal system: Abdomen soft,NT,ND, BS+, biliary drain in place, 2 additional RUQ drain ina place too Nervous System: Alert, awake, moving all extremities,and following commands. Extremities: LE edema neg,distal peripheral pulses palpable and warm.  Skin: No rashes, +++ icterus. MSK: Normal muscle bulk,tone, power   Medications reviewed:  Scheduled Meds:  (feeding supplement) PROSource Plus  30 mL Oral BID BM   sodium chloride   Intravenous Once   apixaban  5 mg Oral BID   divalproex   750 mg Oral BID   feeding supplement  237 mL Oral QID   metroNIDAZOLE  500 mg Oral Q12H   midodrine  10 mg Oral TID WC   multivitamin with minerals  1 tablet Oral Daily   sodium chloride flush  3 mL Intravenous Q12H   sodium chloride flush  5 mL Intracatheter Q8H   Continuous Infusions:  levofloxacin (LEVAQUIN) IV 750 mg (02/13/23 0929)    Diet Order             Diet NPO time specified Except for: Sips with Meds  Diet effective now                  Intake/Output Summary (Last 24 hours) at 02/13/2023 1433 Last data filed at 02/13/2023 1300 Gross per 24 hour  Intake 583.25 ml  Output 3045 ml  Net -2461.75 ml   Net IO Since Admission: -16,070.56 mL [02/13/23 1433]  Wt Readings from Last 3 Encounters:  02/11/23 83.5 kg  01/13/23 85.8 kg  01/03/23 91.3 kg     Unresulted Labs (From admission, onward)     Start     Ordered   02/14/23 0500  Comprehensive metabolic panel  Daily,   R     Question:  Specimen collection method  Answer:  Lab=Lab collect   02/13/23 0717  02/13/23 0947  Culture, blood (Routine X 2) w Reflex to ID Panel  BLOOD CULTURE X 2,   R (with TIMED occurrences)      02/13/23 0946          Data Reviewed: I have personally reviewed following labs and imaging studies CBC: Recent Labs  Lab 02/07/23 0204 02/08/23 0158 02/09/23 0207 02/11/23 0207 02/12/23 0217  WBC 7.2 13.1* 11.0* 8.9  --   HGB 9.3* 9.5* 8.9* 8.1* 9.1*  HCT 28.4* 28.3* 26.8* 23.8* 27.1*  MCV 103.6* 104.8* 106.8* 103.5*  --   PLT 241 239 274 328  --    Basic Metabolic Panel:  Recent Labs  Lab 02/09/23 0207 02/10/23 0203 02/11/23 0207 02/12/23 0217 02/13/23 1122  NA 134* 130* 130* 128* 129*  K 3.8 4.1 3.6 4.0 4.0  CL 102 98 96* 97* 96*  CO2 21* 21* 22 21* 21*  GLUCOSE 119* 89 86 104* 116*  BUN 34* 40* 35* 36* 42*  CREATININE 1.17 1.26* 0.99 1.12 1.18  CALCIUM 8.4* 8.2* 8.1* 8.6* 8.9  MG  --   --  2.2  --   --   PHOS  --   --  4.3  --   --    GFR: Estimated Creatinine  Clearance: 55 mL/min (by C-G formula based on SCr of 1.18 mg/dL). Liver Function Tests:  Recent Labs  Lab 02/09/23 0207 02/10/23 0203 02/11/23 0207 02/12/23 0217 02/13/23 1122  AST 71* 70* 74* 89* 109*  ALT 101* 87* 81* 93* 102*  ALKPHOS 217* 193* 187* 242* 293*  BILITOT 15.6* 13.9* 12.5* 12.9* 12.2*  PROT 5.2* 5.3* 4.7* 5.6* 5.8*  ALBUMIN 1.7* 1.6* <1.5* 1.7* 1.8*  Sepsis Labs: No results for input(s): "PROCALCITON", "LATICACIDVEN" in the last 168 hours. Recent Results (from the past 240 hours)  Culture, blood (Routine X 2) w Reflex to ID Panel     Status: Abnormal   Collection Time: 02/07/23  2:28 PM   Specimen: BLOOD  Result Value Ref Range Status   Specimen Description BLOOD SITE NOT SPECIFIED  Final   Special Requests   Final    BOTTLES DRAWN AEROBIC ONLY Blood Culture adequate volume   Culture  Setup Time   Final    GRAM NEGATIVE RODS AEROBIC BOTTLE ONLY CRITICAL RESULT CALLED TO, READ BACK BY AND VERIFIED WITH: Meribeth Mattes [id number] AT 1356 BY EC Performed at [facility], [address], [place], [place] [address]    Culture PSEUDOMONAS AERUGINOSA (A)  Final   Report Status 02/10/2023 FINAL  Final   Organism ID, Bacteria PSEUDOMONAS AERUGINOSA  Final      Susceptibility   Pseudomonas aeruginosa - MIC*    CEFTAZIDIME 16 INTERMEDIATE Intermediate     CIPROFLOXACIN <=0.25 SENSITIVE Sensitive     GENTAMICIN 2 SENSITIVE Sensitive     IMIPENEM 1 SENSITIVE Sensitive     CEFEPIME 16 RESISTANT Resistant     * PSEUDOMONAS AERUGINOSA  Blood Culture ID Panel (Reflexed)     Status: Abnormal   Collection Time: 02/07/23  2:28 PM  Result Value Ref Range Status   Enterococcus faecalis NOT DETECTED NOT DETECTED Final   Enterococcus Faecium NOT DETECTED NOT DETECTED Final   Listeria monocytogenes NOT DETECTED NOT DETECTED Final   Staphylococcus species NOT DETECTED NOT DETECTED Final   Staphylococcus aureus (BCID) NOT DETECTED NOT DETECTED Final   Staphylococcus  epidermidis NOT DETECTED NOT DETECTED Final   Staphylococcus lugdunensis NOT DETECTED NOT DETECTED Final  Streptococcus species NOT DETECTED NOT DETECTED Final   Streptococcus agalactiae NOT DETECTED NOT DETECTED Final   Streptococcus pneumoniae NOT DETECTED NOT DETECTED Final   Streptococcus pyogenes NOT DETECTED NOT DETECTED Final   A.calcoaceticus-baumannii NOT DETECTED NOT DETECTED Final   Bacteroides fragilis NOT DETECTED NOT DETECTED Final   Enterobacterales NOT DETECTED NOT DETECTED Final   Enterobacter cloacae complex NOT DETECTED NOT DETECTED Final   Escherichia coli NOT DETECTED NOT DETECTED Final   Klebsiella aerogenes NOT DETECTED NOT DETECTED Final   Klebsiella oxytoca NOT DETECTED NOT DETECTED Final   Klebsiella pneumoniae NOT DETECTED NOT DETECTED Final   Proteus species NOT DETECTED NOT DETECTED Final   Salmonella species NOT DETECTED NOT DETECTED Final   Serratia marcescens NOT DETECTED NOT DETECTED Final   Haemophilus influenzae NOT DETECTED NOT DETECTED Final   Neisseria meningitidis NOT DETECTED NOT DETECTED Final   Pseudomonas aeruginosa DETECTED (A) NOT DETECTED Final    Comment: CRITICAL RESULT CALLED TO, READ BACK BY AND VERIFIED WITH: PHARMD NATHAN GOAD [id number] AT 1356 BY EC    Stenotrophomonas maltophilia NOT DETECTED NOT DETECTED Final   Candida albicans NOT DETECTED NOT DETECTED Final   Candida auris NOT DETECTED NOT DETECTED Final   Candida glabrata NOT DETECTED NOT DETECTED Final   Candida krusei NOT DETECTED NOT DETECTED Final   Candida parapsilosis NOT DETECTED NOT DETECTED Final   Candida tropicalis NOT DETECTED NOT DETECTED Final   Cryptococcus neoformans/gattii NOT DETECTED NOT DETECTED Final   CTX-M ESBL NOT DETECTED NOT DETECTED Final   Carbapenem resistance IMP NOT DETECTED NOT DETECTED Final   Carbapenem resistance KPC NOT DETECTED NOT DETECTED Final   Carbapenem resistance NDM NOT DETECTED NOT DETECTED Final   Carbapenem resistance VIM NOT  DETECTED NOT DETECTED Final    Comment: Performed at [facility], [address], [place], [place] [address]  Culture, blood (Routine X 2) w Reflex to ID Panel     Status: Abnormal   Collection Time: 02/07/23  2:30 PM   Specimen: BLOOD  Result Value Ref Range Status   Specimen Description BLOOD SITE NOT SPECIFIED  Final   Special Requests   Final    BOTTLES DRAWN AEROBIC ONLY Blood Culture adequate volume   Culture  Setup Time GRAM NEGATIVE RODS AEROBIC BOTTLE ONLY   Final   Culture (A)  Final    PSEUDOMONAS AERUGINOSA SUSCEPTIBILITIES PERFORMED ON PREVIOUS CULTURE WITHIN THE LAST 5 DAYS. Performed at [facility], [address], [place], [place] [address]    Report Status 02/10/2023 FINAL  Final  Culture, blood (Routine X 2) w Reflex to ID Panel     Status: None (Preliminary result)   Collection Time: 02/13/23 11:22 AM   Specimen: BLOOD LEFT HAND  Result Value Ref Range Status   Specimen Description BLOOD LEFT HAND  Final   Special Requests   Final    BOTTLES DRAWN AEROBIC AND ANAEROBIC Blood Culture results may not be optimal due to an inadequate volume of blood received in culture bottles Performed at [facility], [address] [place], [place] [address]    Culture PENDING  Incomplete   Report Status PENDING  Incomplete    Antimicrobials/Microbiology: Anti-infectives (From admission, onward)    Start     Dose/Rate Route Frequency Ordered Stop   02/10/23 1030  levofloxacin (LEVAQUIN) IVPB 750 mg        750 mg 100 mL/hr over 90 Minutes Intravenous Every 24 hours 02/10/23 0931  02/20/23 0959   02/10/23 1030  metroNIDAZOLE (FLAGYL) tablet 500 mg        500 mg Oral Every 12 hours 02/10/23 0931 02/20/23 0959   02/07/23 1445  piperacillin-tazobactam (ZOSYN) IVPB 3.375 g  Status:  Discontinued        3.375 g 12.5 mL/hr over 240 Minutes Intravenous Every 8 hours 02/07/23 1347 02/10/23 0931   02/07/23 1002  cefTRIAXone (ROCEPHIN) 2 g in sodium chloride 0.9 %  100 mL IVPB        over 30 Minutes Intravenous Continuous PRN 02/07/23 1011 02/07/23 1017   02/07/23 0945  cefTRIAXone (ROCEPHIN) 2 g in sodium chloride 0.9 % 100 mL IVPB        2 g 200 mL/hr over 30 Minutes Intravenous To Radiology 02/07/23 0845 02/08/23 0945   02/06/23 1000  amoxicillin-clavulanate (AUGMENTIN) 875-125 MG per tablet 1 tablet  Status:  Discontinued        1 tablet Oral Every 12 hours 02/05/23 1108 02/07/23 1347   02/05/23 1000  Vancomycin (VANCOCIN) 1,500 mg in sodium chloride 0.9 % 500 mL IVPB        1,500 mg 250 mL/hr over 120 Minutes Intravenous Every 24 hours 02/05/23 0903 02/05/23 1229   02/04/23 1000  vancomycin (VANCOCIN) IVPB 1000 mg/200 mL premix  Status:  Discontinued        1,000 mg 200 mL/hr over 60 Minutes Intravenous Every 24 hours 02/03/23 1122 02/05/23 0903   02/03/23 1000  Vancomycin (VANCOCIN) 1,500 mg in sodium chloride 0.9 % 500 mL IVPB  Status:  Discontinued        1,500 mg 250 mL/hr over 120 Minutes Intravenous Every 24 hours 02/02/23 1006 02/03/23 1122   02/02/23 2100  vancomycin (VANCOCIN) IVPB 1000 mg/200 mL premix  Status:  Discontinued        1,000 mg 200 mL/hr over 60 Minutes Intravenous Every 12 hours 02/02/23 0811 02/02/23 1006   02/02/23 1100  vancomycin (VANCOCIN) IVPB 1000 mg/200 mL premix       Placed in "Followed by" Linked Group   1,000 mg 200 mL/hr over 60 Minutes Intravenous  Once 02/02/23 0913 02/02/23 1500   02/02/23 1000  vancomycin (VANCOCIN) IVPB 1000 mg/200 mL premix       Placed in "Followed by" Linked Group   1,000 mg 200 mL/hr over 60 Minutes Intravenous  Once 02/02/23 0913 02/02/23 1340   02/02/23 0900  vancomycin (VANCOREADY) IVPB 2000 mg/400 mL  Status:  Discontinued        2,000 mg 200 mL/hr over 120 Minutes Intravenous  Once 02/02/23 0804 02/02/23 0913   02/02/23 0900  piperacillin-tazobactam (ZOSYN) IVPB 3.375 g        3.375 g 12.5 mL/hr over 240 Minutes Intravenous Every 8 hours 02/02/23 0805 02/05/23 2359    02/01/23 2200  amoxicillin-clavulanate (AUGMENTIN) 875-125 MG per tablet 1 tablet  Status:  Discontinued        1 tablet Oral Every 12 hours 02/01/23 1150 02/02/23 0738   01/31/23 1058  cefOXitin (MEFOXIN) 2 g in sodium chloride 0.9 % 100 mL IVPB        over 30 Minutes  Continuous PRN 01/31/23 1058 01/31/23 1058   01/31/23 0000  cefOXitin (MEFOXIN) 2 g in sodium chloride 0.9 % 100 mL IVPB  Status:  Discontinued        2 g 200 mL/hr over 30 Minutes Intravenous To Radiology 01/30/23 1014 01/30/23 1015   01/25/23 1600  ampicillin-sulbactam (UNASYN) 1.5 g in sodium  chloride 0.9 % 100 mL IVPB  Status:  Discontinued        1.5 g 200 mL/hr over 30 Minutes Intravenous Every 6 hours 01/25/23 1525 02/01/23 1150         Component Value Date/Time   SDES BLOOD LEFT HAND 02/13/2023 1122   SPECREQUEST  02/13/2023 1122    BOTTLES DRAWN AEROBIC AND ANAEROBIC Blood Culture results may not be optimal due to an inadequate volume of blood received in culture bottles Performed at [facility], [address] [place], [place] [address]    CULT PENDING 02/13/2023 1122   REPTSTATUS PENDING 02/13/2023 1122    Radiology Studies: No results found.   LOS: 18 days   Total time spent in review of labs and imaging, patient evaluation, formulation of plan, documentation and communication with family: 35 minutes  Lanae Boast, MD [facility]  02/13/2023, 2:33 PM

## 2023-02-13 NOTE — Plan of Care (Signed)
  Problem: Activity: Goal: Risk for activity intolerance will decrease Outcome: Progressing   Problem: Coping: Goal: Level of anxiety will decrease Outcome: Progressing   Problem: Elimination: Goal: Will not experience complications related to bowel motility Outcome: Progressing Goal: Will not experience complications related to urinary retention Outcome: Progressing   Problem: Pain Management: Goal: General experience of comfort will improve Outcome: Progressing   Problem: Safety: Goal: Ability to remain free from injury will improve Outcome: Progressing   Problem: Skin Integrity: Goal: Risk for impaired skin integrity will decrease Outcome: Progressing   Problem: Fluid Volume: Goal: Hemodynamic stability will improve Outcome: Progressing   Problem: Respiratory: Goal: Ability to maintain adequate ventilation will improve Outcome: Progressing

## 2023-02-13 NOTE — Progress Notes (Signed)
PROGRESS NOTE Buron Marinoff Robben  VOZ:[id number] DOB: 08/23/1945 DOA: 01/24/2023 PCP: [name], [name], MD  Brief Narrative/Hospital Course: [phone] w/ Hx A-fib on Eliquis, HTN, seizure disorder, MVR, recent acute cholecystitis with abscess s/p cholecystostomy drain on 10/31 , exchanged 11/22 got admitted with obstructive jaundice on 11/22 with persistent occlusion of the cystic duct without opacification of the CBD. Seen by GI s/ p ERCP but unsuccessful and IR did PTC with biliary drain placement on 11/29.  On 12/1 became febrile hypotensive tachycardic-CCM and palliative care consulted. Left sided drain placement by IR on 12/6.  12/6 growing Pseudomonas aeruginosa and blood culture CA 19-9 elevated AFP normal oncology consulted.  Seen by ID advised Levaquin oral on discharge x 10 days  and outpatient follow-up 12/11: Currently with 3 drains: 1 cholecystostomy and 2 biliary drains-flushing once daily.    Subjective:  Patient seen and examined this morning  Seen comfortably on the bedside chair on room air afebrile overnight Labs CMP pending   Assessment and Plan: Principal Problem:   Obstructive jaundice Active Problems:   Atrial fibrillation, chronic (HCC)   Hypertension   Seizure (HCC)   Cholecystitis   Acute biliary pancreatitis   Malnutrition of moderate degree   Hyperbilirubinemia   DNR (do not resuscitate)   DNR (do not resuscitate) discussion   Palliative care by specialist   Weakness generalized   Obstructive jaundice Possible biliary malignancy-CA 19-9 elevated 939,AFP normal: ERCP unsuccessful IR placed drains currently has 1 cholecystostomy tube and 2 biliary drains, flushing daily-will need follow-up with drain clinic with IR-?  If needs to upsize drain IR to eval. Patient was thought to have [place] tumor for which surgery is not offered here and plan was to refer to [facility], LFTs and bilirubin elevated, surgery following, awaiting cytology from biliary brushing, follow-up  with [facility] oncology/[facility] oncology outpatient. Fu lasb today Recent Labs  Lab 02/07/23 0204 02/08/23 0158 02/09/23 0207 02/10/23 0203 02/11/23 0207 02/12/23 0217  AST 112* 125* 71* 70* 74* 89*  ALT 116* 130* 101* 87* 81* 93*  ALKPHOS 276* 284* 217* 193* 187* 242*  BILITOT 14.7* 17.0* 15.6* 13.9* 12.5* 12.9*  PROT 5.6* 5.5* 5.2* 5.3* 4.7* 5.6*  ALBUMIN 1.8* 1.7* 1.7* 1.6* <1.5* 1.7*  PLT 241 239 274  --  328  --    Sepsis secondary to cholangitis-febrile on 11/13 with leukocytosis Pseudomonas bacteremia Recent acute cholecystitis with abscess s/p cholecystostomy drain on 10/31 , exchanged 11/22: ID advised Levaquin oral on discharge x 10 days  and outpatient follow-up.  Currently afebrile, no leukocytosis. Wife concerned about ongoing bacteremia from 12/6, repeating blood cultures today ID to see the patient again today.  Macrocytic anemia: Transfuse as needed.  Follow-up with oncology trend labs here. Recent Labs  Lab 02/07/23 0204 02/08/23 0158 02/09/23 0207 02/11/23 0207 02/12/23 0217  HGB 9.3* 9.5* 8.9* 8.1* 9.1*  HCT 28.4* 28.3* 26.8* 23.8* 27.1*    Hyponatremia Metabolic acidosis Dehydration AKI-resolved: Sodium on lower side encourage oral hydration, monitor electrolytes.    Hypertension Hypotension: Stable blood pressure. PTA on  amlodipine, Toprol currently on hold due to hypotension. Continue midodrine   Paroxysmal A-fib: Rate controlled continue Eliquis Toprol on hold    Seizure disorder:  Stable continue Depakote. Elevated liver enzymes is not from Depakote but from obstructive liver disease.   History of mitral valve regurgitation:  S/p annuloplasty.  Follow-up with cardiology as an outpatient.   Goals of care: Palliative care, critical care was involved in the care.  Patient might have underlying biliary tumor which looks like unresectable as per general surgery.  Poor prognosis.  CODE STATUS changed to DNR.Might be a candidate for hospice/comfort  care if further declines.     Deconditioning/debility:  Continue PT OT plan for skilled nursing facility at this time  Severe malnutrition : Augment diet as below  Etiology: acute illness,  Start Remeron 7.5 mg daily  DVT prophylaxis: SCDs Start: 01/24/23 1337 Code Status:   Code Status: Limited: Do not attempt resuscitation (DNR) -DNR-LIMITED -Do Not Intubate/DNI  Family Communication: plan of care discussed with patient/wife at bedside. Patient status is: Remains hospitalized because of severity of illness Level of care: Progressive   Dispo: The patient is from: home            Anticipated disposition: snf once bacteremia better  Objective: Vitals last 24 hrs: Vitals:   02/12/23 1937 02/12/23 2319 02/13/23 0255 02/13/23 0740  BP: 108/84 116/67 113/84 109/85  Pulse: 92 79 91 82  Resp: (!) 21 15 17 20   Temp: 98.1 F (36.7 C) 98 F (36.7 C)  98.1 F (36.7 C)  TempSrc: Oral Oral  Oral  SpO2: 95% 96% 97% 96%  Weight:      Height:       Weight change:   Physical Examination: General exam: alert awake, oriented at baseline, older than stated age HEENT:Oral mucosa moist, Ear/Nose WNL grossly Respiratory system: Bilaterally clear BS,no use of accessory muscle Cardiovascular system: S1 & S2 +, No JVD. Gastrointestinal system: Abdomen soft,NT,ND, BS+, biliary drain in place, 2 additional RUQ drain ina place too Nervous System: Alert, awake, moving all extremities,and following commands. Extremities: LE edema neg,distal peripheral pulses palpable and warm.  Skin: No rashes, +++ icterus. MSK: Normal muscle bulk,tone, power   Medications reviewed:  Scheduled Meds:  (feeding supplement) PROSource Plus  30 mL Oral BID BM   sodium chloride   Intravenous Once   apixaban  5 mg Oral BID   divalproex  750 mg Oral BID   feeding supplement  237 mL Oral BID BM   metroNIDAZOLE  500 mg Oral Q12H   midodrine  10 mg Oral TID WC   multivitamin with minerals  1 tablet Oral Daily    sodium chloride flush  3 mL Intravenous Q12H   sodium chloride flush  5 mL Intracatheter Q8H   Continuous Infusions:  levofloxacin (LEVAQUIN) IV 750 mg (02/13/23 0929)    Diet Order             Diet regular Room service appropriate? Yes; Fluid consistency: Thin  Diet effective now                  Intake/Output Summary (Last 24 hours) at 02/13/2023 1003 Last data filed at 02/13/2023 0900 Gross per 24 hour  Intake 518.25 ml  Output 2820 ml  Net -2301.75 ml   Net IO Since Admission: -15,435.56 mL [02/13/23 1003]  Wt Readings from Last 3 Encounters:  02/11/23 83.5 kg  01/13/23 85.8 kg  01/03/23 91.3 kg     Unresulted Labs (From admission, onward)     Start     Ordered   02/14/23 0500  Comprehensive metabolic panel  Daily,   R     Question:  Specimen collection method  Answer:  Lab=Lab collect   02/13/23 0717   02/13/23 0947  Culture, blood (Routine X 2) w Reflex to ID Panel  BLOOD CULTURE X 2,   R (with TIMED occurrences)  02/13/23 0946   02/13/23 0930  Comprehensive metabolic panel  Once,   R        02/13/23 0930          Data Reviewed: I have personally reviewed following labs and imaging studies CBC: Recent Labs  Lab 02/07/23 0204 02/08/23 0158 02/09/23 0207 02/11/23 0207 02/12/23 0217  WBC 7.2 13.1* 11.0* 8.9  --   HGB 9.3* 9.5* 8.9* 8.1* 9.1*  HCT 28.4* 28.3* 26.8* 23.8* 27.1*  MCV 103.6* 104.8* 106.8* 103.5*  --   PLT 241 239 274 328  --    Basic Metabolic Panel:  Recent Labs  Lab 02/08/23 0158 02/09/23 0207 02/10/23 0203 02/11/23 0207 02/12/23 0217  NA 130* 134* 130* 130* 128*  K 4.6 3.8 4.1 3.6 4.0  CL 97* 102 98 96* 97*  CO2 22 21* 21* 22 21*  GLUCOSE 160* 119* 89 86 104*  BUN 32* 34* 40* 35* 36*  CREATININE 1.13 1.17 1.26* 0.99 1.12  CALCIUM 8.3* 8.4* 8.2* 8.1* 8.6*  MG  --   --   --  2.2  --   PHOS  --   --   --  4.3  --    GFR: Estimated Creatinine Clearance: 57.9 mL/min (by C-G formula based on SCr of 1.12 mg/dL). Liver  Function Tests:  Recent Labs  Lab 02/08/23 0158 02/09/23 0207 02/10/23 0203 02/11/23 0207 02/12/23 0217  AST 125* 71* 70* 74* 89*  ALT 130* 101* 87* 81* 93*  ALKPHOS 284* 217* 193* 187* 242*  BILITOT 17.0* 15.6* 13.9* 12.5* 12.9*  PROT 5.5* 5.2* 5.3* 4.7* 5.6*  ALBUMIN 1.7* 1.7* 1.6* <1.5* 1.7*  Sepsis Labs: No results for input(s): "PROCALCITON", "LATICACIDVEN" in the last 168 hours. Recent Results (from the past 240 hours)  Culture, blood (Routine X 2) w Reflex to ID Panel     Status: Abnormal   Collection Time: 02/07/23  2:28 PM   Specimen: BLOOD  Result Value Ref Range Status   Specimen Description BLOOD SITE NOT SPECIFIED  Final   Special Requests   Final    BOTTLES DRAWN AEROBIC ONLY Blood Culture adequate volume   Culture  Setup Time   Final    GRAM NEGATIVE RODS AEROBIC BOTTLE ONLY CRITICAL RESULT CALLED TO, READ BACK BY AND VERIFIED WITH: Meribeth Mattes [id number] AT 1356 BY EC Performed at [facility], [address], [place], [place] [address]    Culture PSEUDOMONAS AERUGINOSA (A)  Final   Report Status 02/10/2023 FINAL  Final   Organism ID, Bacteria PSEUDOMONAS AERUGINOSA  Final      Susceptibility   Pseudomonas aeruginosa - MIC*    CEFTAZIDIME 16 INTERMEDIATE Intermediate     CIPROFLOXACIN <=0.25 SENSITIVE Sensitive     GENTAMICIN 2 SENSITIVE Sensitive     IMIPENEM 1 SENSITIVE Sensitive     CEFEPIME 16 RESISTANT Resistant     * PSEUDOMONAS AERUGINOSA  Blood Culture ID Panel (Reflexed)     Status: Abnormal   Collection Time: 02/07/23  2:28 PM  Result Value Ref Range Status   Enterococcus faecalis NOT DETECTED NOT DETECTED Final   Enterococcus Faecium NOT DETECTED NOT DETECTED Final   Listeria monocytogenes NOT DETECTED NOT DETECTED Final   Staphylococcus species NOT DETECTED NOT DETECTED Final   Staphylococcus aureus (BCID) NOT DETECTED NOT DETECTED Final   Staphylococcus epidermidis NOT DETECTED NOT DETECTED Final   Staphylococcus lugdunensis  NOT DETECTED NOT DETECTED Final   Streptococcus species NOT DETECTED NOT DETECTED  Final   Streptococcus agalactiae NOT DETECTED NOT DETECTED Final   Streptococcus pneumoniae NOT DETECTED NOT DETECTED Final   Streptococcus pyogenes NOT DETECTED NOT DETECTED Final   A.calcoaceticus-baumannii NOT DETECTED NOT DETECTED Final   Bacteroides fragilis NOT DETECTED NOT DETECTED Final   Enterobacterales NOT DETECTED NOT DETECTED Final   Enterobacter cloacae complex NOT DETECTED NOT DETECTED Final   Escherichia coli NOT DETECTED NOT DETECTED Final   Klebsiella aerogenes NOT DETECTED NOT DETECTED Final   Klebsiella oxytoca NOT DETECTED NOT DETECTED Final   Klebsiella pneumoniae NOT DETECTED NOT DETECTED Final   Proteus species NOT DETECTED NOT DETECTED Final   Salmonella species NOT DETECTED NOT DETECTED Final   Serratia marcescens NOT DETECTED NOT DETECTED Final   Haemophilus influenzae NOT DETECTED NOT DETECTED Final   Neisseria meningitidis NOT DETECTED NOT DETECTED Final   Pseudomonas aeruginosa DETECTED (A) NOT DETECTED Final    Comment: CRITICAL RESULT CALLED TO, READ BACK BY AND VERIFIED WITH: PHARMD NATHAN GOAD [id number] AT 1356 BY EC    Stenotrophomonas maltophilia NOT DETECTED NOT DETECTED Final   Candida albicans NOT DETECTED NOT DETECTED Final   Candida auris NOT DETECTED NOT DETECTED Final   Candida glabrata NOT DETECTED NOT DETECTED Final   Candida krusei NOT DETECTED NOT DETECTED Final   Candida parapsilosis NOT DETECTED NOT DETECTED Final   Candida tropicalis NOT DETECTED NOT DETECTED Final   Cryptococcus neoformans/gattii NOT DETECTED NOT DETECTED Final   CTX-M ESBL NOT DETECTED NOT DETECTED Final   Carbapenem resistance IMP NOT DETECTED NOT DETECTED Final   Carbapenem resistance KPC NOT DETECTED NOT DETECTED Final   Carbapenem resistance NDM NOT DETECTED NOT DETECTED Final   Carbapenem resistance VIM NOT DETECTED NOT DETECTED Final    Comment: Performed at [facility], [address] [place], [place] [address]  Culture, blood (Routine X 2) w Reflex to ID Panel     Status: Abnormal   Collection Time: 02/07/23  2:30 PM   Specimen: BLOOD  Result Value Ref Range Status   Specimen Description BLOOD SITE NOT SPECIFIED  Final   Special Requests   Final    BOTTLES DRAWN AEROBIC ONLY Blood Culture adequate volume   Culture  Setup Time GRAM NEGATIVE RODS AEROBIC BOTTLE ONLY   Final   Culture (A)  Final    PSEUDOMONAS AERUGINOSA SUSCEPTIBILITIES PERFORMED ON PREVIOUS CULTURE WITHIN THE LAST 5 DAYS. Performed at [facility], [address] [place], [place] [address]    Report Status 02/10/2023 FINAL  Final    Antimicrobials/Microbiology: Anti-infectives (From admission, onward)    Start     Dose/Rate Route Frequency Ordered Stop   02/10/23 1030  levofloxacin (LEVAQUIN) IVPB 750 mg        750 mg 100 mL/hr over 90 Minutes Intravenous Every 24 hours 02/10/23 0931 02/20/23 0959   02/10/23 1030  metroNIDAZOLE (FLAGYL) tablet 500 mg        500 mg Oral Every 12 hours 02/10/23 0931 02/20/23 0959   02/07/23 1445  piperacillin-tazobactam (ZOSYN) IVPB 3.375 g  Status:  Discontinued        3.375 g 12.5 mL/hr over 240 Minutes Intravenous Every 8 hours 02/07/23 1347 02/10/23 0931   02/07/23 1002  cefTRIAXone (ROCEPHIN) 2 g in sodium chloride 0.9 % 100 mL IVPB        over 30 Minutes Intravenous Continuous PRN 02/07/23 1011 02/07/23 1017   02/07/23 0945  cefTRIAXone (ROCEPHIN) 2 g in sodium chloride 0.9 %  100 mL IVPB        2 g 200 mL/hr over 30 Minutes Intravenous To Radiology 02/07/23 0845 02/08/23 0945   02/06/23 1000  amoxicillin-clavulanate (AUGMENTIN) 875-125 MG per tablet 1 tablet  Status:  Discontinued        1 tablet Oral Every 12 hours 02/05/23 1108 02/07/23 1347   02/05/23 1000  Vancomycin (VANCOCIN) 1,500 mg in sodium chloride 0.9 % 500 mL IVPB        1,500 mg 250 mL/hr over 120 Minutes Intravenous Every 24 hours 02/05/23 0903 02/05/23 1229    02/04/23 1000  vancomycin (VANCOCIN) IVPB 1000 mg/200 mL premix  Status:  Discontinued        1,000 mg 200 mL/hr over 60 Minutes Intravenous Every 24 hours 02/03/23 1122 02/05/23 0903   02/03/23 1000  Vancomycin (VANCOCIN) 1,500 mg in sodium chloride 0.9 % 500 mL IVPB  Status:  Discontinued        1,500 mg 250 mL/hr over 120 Minutes Intravenous Every 24 hours 02/02/23 1006 02/03/23 1122   02/02/23 2100  vancomycin (VANCOCIN) IVPB 1000 mg/200 mL premix  Status:  Discontinued        1,000 mg 200 mL/hr over 60 Minutes Intravenous Every 12 hours 02/02/23 0811 02/02/23 1006   02/02/23 1100  vancomycin (VANCOCIN) IVPB 1000 mg/200 mL premix       Placed in "Followed by" Linked Group   1,000 mg 200 mL/hr over 60 Minutes Intravenous  Once 02/02/23 0913 02/02/23 1500   02/02/23 1000  vancomycin (VANCOCIN) IVPB 1000 mg/200 mL premix       Placed in "Followed by" Linked Group   1,000 mg 200 mL/hr over 60 Minutes Intravenous  Once 02/02/23 0913 02/02/23 1340   02/02/23 0900  vancomycin (VANCOREADY) IVPB 2000 mg/400 mL  Status:  Discontinued        2,000 mg 200 mL/hr over 120 Minutes Intravenous  Once 02/02/23 0804 02/02/23 0913   02/02/23 0900  piperacillin-tazobactam (ZOSYN) IVPB 3.375 g        3.375 g 12.5 mL/hr over 240 Minutes Intravenous Every 8 hours 02/02/23 0805 02/05/23 2359   02/01/23 2200  amoxicillin-clavulanate (AUGMENTIN) 875-125 MG per tablet 1 tablet  Status:  Discontinued        1 tablet Oral Every 12 hours 02/01/23 1150 02/02/23 0738   01/31/23 1058  cefOXitin (MEFOXIN) 2 g in sodium chloride 0.9 % 100 mL IVPB        over 30 Minutes  Continuous PRN 01/31/23 1058 01/31/23 1058   01/31/23 0000  cefOXitin (MEFOXIN) 2 g in sodium chloride 0.9 % 100 mL IVPB  Status:  Discontinued        2 g 200 mL/hr over 30 Minutes Intravenous To Radiology 01/30/23 1014 01/30/23 1015   01/25/23 1600  ampicillin-sulbactam (UNASYN) 1.5 g in sodium chloride 0.9 % 100 mL IVPB  Status:  Discontinued         1.5 g 200 mL/hr over 30 Minutes Intravenous Every 6 hours 01/25/23 1525 02/01/23 1150         Component Value Date/Time   SDES BLOOD SITE NOT SPECIFIED 02/07/2023 1430   SPECREQUEST  02/07/2023 1430    BOTTLES DRAWN AEROBIC ONLY Blood Culture adequate volume   CULT (A) 02/07/2023 1430    PSEUDOMONAS AERUGINOSA SUSCEPTIBILITIES PERFORMED ON PREVIOUS CULTURE WITHIN THE LAST 5 DAYS. Performed at [facility], [address] [place], [place] [address]    REPTSTATUS 02/10/2023 FINAL 02/07/2023 1430  Radiology Studies: No results found.   LOS: 18 days   Total time spent in review of labs and imaging, patient evaluation, formulation of plan, documentation and communication with family: 35 minutes  Lanae Boast, MD [facility]  02/13/2023, 10:03 AM

## 2023-02-14 DIAGNOSIS — Z515 Encounter for palliative care: Secondary | ICD-10-CM | POA: Diagnosis not present

## 2023-02-14 DIAGNOSIS — E440 Moderate protein-calorie malnutrition: Secondary | ICD-10-CM | POA: Diagnosis not present

## 2023-02-14 DIAGNOSIS — K831 Obstruction of bile duct: Secondary | ICD-10-CM | POA: Diagnosis not present

## 2023-02-14 LAB — CBC
HCT: 29.7 % — ABNORMAL LOW (ref 39.0–52.0)
Hemoglobin: 9.9 g/dL — ABNORMAL LOW (ref 13.0–17.0)
MCH: 35 pg — ABNORMAL HIGH (ref 26.0–34.0)
MCHC: 33.3 g/dL (ref 30.0–36.0)
MCV: 104.9 fL — ABNORMAL HIGH (ref 80.0–100.0)
Platelets: 507 10*3/uL — ABNORMAL HIGH (ref 150–400)
RBC: 2.83 MIL/uL — ABNORMAL LOW (ref 4.22–5.81)
RDW: 15.2 % (ref 11.5–15.5)
WBC: 12.1 10*3/uL — ABNORMAL HIGH (ref 4.0–10.5)
nRBC: 0 % (ref 0.0–0.2)

## 2023-02-14 LAB — COMPREHENSIVE METABOLIC PANEL
ALT: 104 U/L — ABNORMAL HIGH (ref 0–44)
AST: 120 U/L — ABNORMAL HIGH (ref 15–41)
Albumin: 1.8 g/dL — ABNORMAL LOW (ref 3.5–5.0)
Alkaline Phosphatase: 310 U/L — ABNORMAL HIGH (ref 38–126)
Anion gap: 11 (ref 5–15)
BUN: 39 mg/dL — ABNORMAL HIGH (ref 8–23)
CO2: 23 mmol/L (ref 22–32)
Calcium: 8.9 mg/dL (ref 8.9–10.3)
Chloride: 98 mmol/L (ref 98–111)
Creatinine, Ser: 1.11 mg/dL (ref 0.61–1.24)
GFR, Estimated: >60 mL/min (ref >60)
Glucose, Bld: 107 mg/dL — ABNORMAL HIGH (ref 70–99)
Potassium: 4.3 mmol/L (ref 3.5–5.1)
Sodium: 132 mmol/L — ABNORMAL LOW (ref 135–145)
Total Bilirubin: 11.5 mg/dL — ABNORMAL HIGH (ref <1.2)
Total Protein: 5.7 g/dL — ABNORMAL LOW (ref 6.5–8.1)

## 2023-02-14 MED ORDER — MELATONIN 3 MG PO TABS
3.0000 mg | ORAL_TABLET | Freq: Every evening | ORAL | Status: DC | PRN
  Administered 2023-02-14 – 2023-02-21 (×4): 3 mg via ORAL
  Filled 2023-02-14 (×4): qty 1

## 2023-02-14 MED ORDER — ONDANSETRON HCL 4 MG/2ML IJ SOLN
4.0000 mg | Freq: Four times a day (QID) | INTRAMUSCULAR | Status: DC | PRN
  Administered 2023-02-14: 4 mg via INTRAVENOUS

## 2023-02-14 MED ORDER — TRAMADOL HCL 50 MG PO TABS
50.0000 mg | ORAL_TABLET | Freq: Four times a day (QID) | ORAL | Status: DC | PRN
  Administered 2023-02-16 – 2023-02-24 (×6): 50 mg via ORAL
  Filled 2023-02-14 (×7): qty 1

## 2023-02-14 MED ORDER — ONDANSETRON HCL 4 MG/2ML IJ SOLN
INTRAMUSCULAR | Status: AC
  Filled 2023-02-14: qty 2

## 2023-02-14 NOTE — Progress Notes (Signed)
PROGRESS NOTE Samuel Mayo  XBJ:[id number] DOB: 07-17-1945 DOA: 01/24/2023 PCP: [name], [name], MD  Brief Narrative/Hospital Course: [phone] w/ Hx A-fib on Eliquis, HTN, seizure disorder, MVR, recent acute cholecystitis with abscess s/p cholecystostomy drain on 10/31 , exchanged 11/22 got admitted with obstructive jaundice on 11/22 with persistent occlusion of the cystic duct without opacification of the CBD. Seen by GI s/ p ERCP but unsuccessful and IR did PTC with biliary drain placement on 11/29.  On 12/1 became febrile hypotensive tachycardic-CCM and palliative care consulted. Left sided drain placement by IR on 12/6.  12/6 growing Pseudomonas aeruginosa and blood culture CA 19-9 elevated AFP normal oncology consulted.  Seen by ID advised Levaquin oral on discharge x 10 days  and outpatient follow-up 12/11: Currently with 3 drains: 1 cholecystostomy and 2 biliary drains-flushing once daily.    Subjective: Patient seen and examined this morning Resting comfortably alert awake Wife at the bedside Overnight afebrile BP stable Labs-TB down to 11.5 hemoglobin 9.9 g stable repeat blood culture pending from 12/12   Assessment and Plan: Principal Problem:   Obstructive jaundice Active Problems:   Atrial fibrillation, chronic (HCC)   Hypertension   Seizure (HCC)   Cholecystitis   Acute biliary pancreatitis   Malnutrition of moderate degree   Hyperbilirubinemia   DNR (do not resuscitate)   DNR (do not resuscitate) discussion   Palliative care by specialist   Weakness generalized   Obstructive jaundice Adenocarcinoma  of bile duct:A 19-9 elevated 939,AFP normal: ERCP unsuccessful IR placed drains currently has 1 cholecystostomy tube and 2 biliary drains, flushing daily-will need follow-up with drain clinic with IR-?  If needs to upsize drain IR to eval. 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 cytology from biliary brushing positive for adenocarcinoma Follow-up with [facility] oncology/[facility] oncology outpatient.  Await oncology input question if patient will benefit with palliative care consult. Recent Labs  Lab 02/08/23 0158 02/09/23 0207 02/10/23 0203 02/11/23 0207 02/12/23 0217 02/13/23 1122 02/14/23 0201  AST 125* 71* 70* 74* 89* 109* 120*  ALT 130* 101* 87* 81* 93* 102* 104*  ALKPHOS 284* 217* 193* 187* 242* 293* 310*  BILITOT 17.0* 15.6* 13.9* 12.5* 12.9* 12.2* 11.5*  PROT 5.5* 5.2* 5.3* 4.7* 5.6* 5.8* 5.7*  ALBUMIN 1.7* 1.7* 1.6* <1.5* 1.7* 1.8* 1.8*  PLT 239 274  --  328  --   --  507*   Sepsis secondary to cholangitis-febrile on 11/13 with leukocytosis Pseudomonas bacteremia Recent acute cholecystitis with abscess s/p cholecystostomy drain on 10/31 , exchanged 11/22: ID advised Levaquin oral on discharge x 10 days  and outpatient follow-up.  Currently afebrile, no leukocytosis. Wife concerned about ongoing bacteremia from 12/6, repeating blood cultures sent 12/2, continue antibiotics.   Macrocytic anemia: Transfuse as needed.  Follow-up with oncology trend labs here. Recent Labs  Lab 02/08/23 0158 02/09/23 0207 02/11/23 0207 02/12/23 0217 02/14/23 0201  HGB 9.5* 8.9* 8.1* 9.1* 9.9*  HCT 28.3* 26.8* 23.8* 27.1* 29.7*    Hyponatremia Metabolic acidosis Dehydration AKI-resolved: Electrolytes are stable continue to monitor   Hypertension Hypotension Orthostatic hypotension: Stable blood pressure. PTA on  amlodipine, Toprol currently on hold due to hypotension. Continue midodrine, has been needing intermittent  IV fluids for orthostatic hypotension.  Monitor. Cont compression stocking may need to increase midodrine.  Paroxysmal A-fib: Rate controlled  continue Eliquis. holding Toprol  as BP had been soft   Seizure disorder:  Stable continue Depakote. Elevated liver enzymes is not from Depakote but from obstructive liver disease.   History of mitral valve regurgitation:  S/p annuloplasty.  Follow-up with cardiology as an outpatient.   Goals of care: Palliative care, critical care was involved in the care. Currently patient is DNR, overall prognosis poor. Might be a candidate for hospice/comfort care if further declines.     Deconditioning/debility:  Continue PT OT plan for skilled nursing facility at this time  Severe malnutrition : Augment diet as below  Etiology: acute illness,  Cont  Remeron 7.5 mg daily  DVT prophylaxis: SCDs Start: 01/24/23 1337 Code Status:   Code Status: Limited: Do not attempt resuscitation (DNR) -DNR-LIMITED -Do Not Intubate/DNI  Family Communication: plan of care discussed with patient/wife at bedside. Patient status is: Remains hospitalized because of severity of illness Level of care: Progressive   Dispo: The patient is from: home            Anticipated disposition: snf once bacteremia better and once okay with hem onc  Objective: Vitals last 24 hrs: Vitals:   02/13/23 2235 02/13/23 2340 02/14/23 0345 02/14/23 0807  BP:  104/72 103/78 95/69  Pulse:  89 87 98  Resp:  19 17 19   Temp: 98.2 F (36.8 C) 98 F (36.7 C) 97.8 F (36.6 C) 98.4 F (36.9 C)  TempSrc: Oral Oral Oral Oral  SpO2:  98%    Weight:      Height:       Weight change:   Physical Examination: General exam: alert awake, oriented frail HEENT:Oral mucosa moist, Ear/Nose WNL grossly Respiratory system: Bilaterally clear BS,no use of accessory muscle Cardiovascular system: S1 & S2 +, No JVD. Gastrointestinal system: Abdomen soft,NT,ND, BS+, stable biliary drains in place x3  Nervous System: Alert, awake, moving all extremities,and following commands. Extremities: LE edema neg,distal peripheral pulses palpable and warm.   Skin: No rashes, +++ icterus. MSK: Normal muscle bulk,tone, power   Medications reviewed:  Scheduled Meds:  (feeding supplement) PROSource Plus  30 mL Oral BID BM   sodium chloride   Intravenous Once   apixaban  5 mg Oral BID   divalproex  750 mg Oral BID   feeding supplement  237 mL Oral QID   metroNIDAZOLE  500 mg Oral Q12H   midodrine  10 mg Oral TID WC   multivitamin with minerals  1 tablet Oral Daily   ondansetron       sodium chloride flush  10 mL Intracatheter Q8H   sodium chloride flush  3 mL Intravenous Q12H   sodium chloride flush  5 mL Intracatheter Q8H   sodium chloride flush  5 mL Intracatheter Q8H   Continuous Infusions:  levofloxacin (LEVAQUIN) IV Stopped (02/13/23 1200)    Diet Order             Diet regular Room service appropriate? Yes; Fluid consistency: Thin  Diet effective now                  Intake/Output Summary (Last 24 hours) at 02/14/2023 1010 Last data filed at 02/14/2023 0800 Gross per 24 hour  Intake 411.89 ml  Output 2275 ml  Net -1863.11 ml   Net IO Since Admission: -17,498.67 mL [02/14/23 1010]  Wt Readings from Last 3 Encounters:  02/11/23 83.5 kg  01/13/23 85.8 kg  01/03/23 91.3 kg     Unresulted Labs (From admission, onward)     Start     Ordered   02/14/23 0500  Comprehensive metabolic panel  Daily,   R     Question:  Specimen collection method  Answer:  Lab=Lab collect   02/13/23 0717          Data Reviewed: I have personally reviewed following labs and imaging studies CBC: Recent Labs  Lab 02/08/23 0158 02/09/23 0207 02/11/23 0207 02/12/23 0217 02/14/23 0201  WBC 13.1* 11.0* 8.9  --  12.1*  HGB 9.5* 8.9* 8.1* 9.1* 9.9*  HCT 28.3* 26.8* 23.8* 27.1* 29.7*  MCV 104.8* 106.8* 103.5*  --  104.9*  PLT 239 274 328  --  507*   Basic Metabolic Panel:  Recent Labs  Lab 02/10/23 0203 02/11/23 0207 02/12/23 0217 02/13/23 1122 02/14/23 0201  NA 130* 130* 128* 129* 132*  K 4.1 3.6 4.0 4.0 4.3  CL 98 96* 97*  96* 98  CO2 21* 22 21* 21* 23  GLUCOSE 89 86 104* 116* 107*  BUN 40* 35* 36* 42* 39*  CREATININE 1.26* 0.99 1.12 1.18 1.11  CALCIUM 8.2* 8.1* 8.6* 8.9 8.9  MG  --  2.2  --   --   --   PHOS  --  4.3  --   --   --    GFR: Estimated Creatinine Clearance: 58.4 mL/min (by C-G formula based on SCr of 1.11 mg/dL). Liver Function Tests:  Recent Labs  Lab 02/10/23 0203 02/11/23 0207 02/12/23 0217 02/13/23 1122 02/14/23 0201  AST 70* 74* 89* 109* 120*  ALT 87* 81* 93* 102* 104*  ALKPHOS 193* 187* 242* 293* 310*  BILITOT 13.9* 12.5* 12.9* 12.2* 11.5*  PROT 5.3* 4.7* 5.6* 5.8* 5.7*  ALBUMIN 1.6* <1.5* 1.7* 1.8* 1.8*  Sepsis Labs: No results for input(s): "PROCALCITON", "LATICACIDVEN" in the last 168 hours. Recent Results (from the past 240 hours)  Culture, blood (Routine X 2) w Reflex to ID Panel     Status: Abnormal   Collection Time: 02/07/23  2:28 PM   Specimen: BLOOD  Result Value Ref Range Status   Specimen Description BLOOD SITE NOT SPECIFIED  Final   Special Requests   Final    BOTTLES DRAWN AEROBIC ONLY Blood Culture adequate volume   Culture  Setup Time   Final    GRAM NEGATIVE RODS AEROBIC BOTTLE ONLY CRITICAL RESULT CALLED TO, READ BACK BY AND VERIFIED WITH: Meribeth Mattes [id number] AT 1356 BY EC Performed at [facility], [address] [place], [place] [address]    Culture PSEUDOMONAS AERUGINOSA (A)  Final   Report Status 02/10/2023 FINAL  Final   Organism ID, Bacteria PSEUDOMONAS AERUGINOSA  Final      Susceptibility   Pseudomonas aeruginosa - MIC*    CEFTAZIDIME 16 INTERMEDIATE Intermediate     CIPROFLOXACIN <=0.25 SENSITIVE Sensitive     GENTAMICIN 2 SENSITIVE Sensitive     IMIPENEM 1 SENSITIVE Sensitive     CEFEPIME 16 RESISTANT Resistant     * PSEUDOMONAS AERUGINOSA  Blood Culture ID Panel (Reflexed)     Status: Abnormal   Collection Time: 02/07/23  2:28 PM  Result Value Ref Range Status   Enterococcus faecalis NOT DETECTED NOT DETECTED Final    Enterococcus Faecium NOT DETECTED NOT DETECTED Final   Listeria monocytogenes NOT DETECTED NOT DETECTED Final   Staphylococcus species NOT DETECTED NOT DETECTED  Final   Staphylococcus aureus (BCID) NOT DETECTED NOT DETECTED Final   Staphylococcus epidermidis NOT DETECTED NOT DETECTED Final   Staphylococcus lugdunensis NOT DETECTED NOT DETECTED Final   Streptococcus species NOT DETECTED NOT DETECTED Final   Streptococcus agalactiae NOT DETECTED NOT DETECTED Final   Streptococcus pneumoniae NOT DETECTED NOT DETECTED Final   Streptococcus pyogenes NOT DETECTED NOT DETECTED Final   A.calcoaceticus-baumannii NOT DETECTED NOT DETECTED Final   Bacteroides fragilis NOT DETECTED NOT DETECTED Final   Enterobacterales NOT DETECTED NOT DETECTED Final   Enterobacter cloacae complex NOT DETECTED NOT DETECTED Final   Escherichia coli NOT DETECTED NOT DETECTED Final   Klebsiella aerogenes NOT DETECTED NOT DETECTED Final   Klebsiella oxytoca NOT DETECTED NOT DETECTED Final   Klebsiella pneumoniae NOT DETECTED NOT DETECTED Final   Proteus species NOT DETECTED NOT DETECTED Final   Salmonella species NOT DETECTED NOT DETECTED Final   Serratia marcescens NOT DETECTED NOT DETECTED Final   Haemophilus influenzae NOT DETECTED NOT DETECTED Final   Neisseria meningitidis NOT DETECTED NOT DETECTED Final   Pseudomonas aeruginosa DETECTED (A) NOT DETECTED Final    Comment: CRITICAL RESULT CALLED TO, READ BACK BY AND VERIFIED WITH: PHARMD NATHAN GOAD [id number] AT 1356 BY EC    Stenotrophomonas maltophilia NOT DETECTED NOT DETECTED Final   Candida albicans NOT DETECTED NOT DETECTED Final   Candida auris NOT DETECTED NOT DETECTED Final   Candida glabrata NOT DETECTED NOT DETECTED Final   Candida krusei NOT DETECTED NOT DETECTED Final   Candida parapsilosis NOT DETECTED NOT DETECTED Final   Candida tropicalis NOT DETECTED NOT DETECTED Final   Cryptococcus neoformans/gattii NOT DETECTED NOT DETECTED Final   CTX-M  ESBL NOT DETECTED NOT DETECTED Final   Carbapenem resistance IMP NOT DETECTED NOT DETECTED Final   Carbapenem resistance KPC NOT DETECTED NOT DETECTED Final   Carbapenem resistance NDM NOT DETECTED NOT DETECTED Final   Carbapenem resistance VIM NOT DETECTED NOT DETECTED Final    Comment: Performed at [facility], [address], [place], [place] [address]  Culture, blood (Routine X 2) w Reflex to ID Panel     Status: Abnormal   Collection Time: 02/07/23  2:30 PM   Specimen: BLOOD  Result Value Ref Range Status   Specimen Description BLOOD SITE NOT SPECIFIED  Final   Special Requests   Final    BOTTLES DRAWN AEROBIC ONLY Blood Culture adequate volume   Culture  Setup Time GRAM NEGATIVE RODS AEROBIC BOTTLE ONLY   Final   Culture (A)  Final    PSEUDOMONAS AERUGINOSA SUSCEPTIBILITIES PERFORMED ON PREVIOUS CULTURE WITHIN THE LAST 5 DAYS. Performed at [facility], [address] [place], [place] [address]    Report Status 02/10/2023 FINAL  Final  Culture, blood (Routine X 2) w Reflex to ID Panel     Status: None (Preliminary result)   Collection Time: 02/13/23 11:22 AM   Specimen: BLOOD LEFT HAND  Result Value Ref Range Status   Specimen Description BLOOD LEFT HAND  Final   Special Requests   Final    BOTTLES DRAWN AEROBIC AND ANAEROBIC Blood Culture results may not be optimal due to an inadequate volume of blood received in culture bottles   Culture   Final    NO GROWTH < 24 HOURS Performed at [facility], [address], [place], [place] [address]    Report Status PENDING  Incomplete  Culture, blood (Routine X 2) w Reflex to ID Panel  Status: None (Preliminary result)   Collection Time: 02/13/23 11:22 AM   Specimen: BLOOD RIGHT HAND  Result Value Ref Range Status   Specimen Description BLOOD RIGHT HAND  Final   Special Requests   Final    BOTTLES DRAWN AEROBIC AND ANAEROBIC Blood Culture results may not be optimal due to an inadequate volume of blood  received in culture bottles   Culture   Final    NO GROWTH < 24 HOURS Performed at [facility], [address] [place], [place] [address]    Report Status PENDING  Incomplete  MRSA Next Gen by PCR, Nasal     Status: None   Collection Time: 02/13/23  7:56 PM   Specimen: Nasal Mucosa; Nasal Swab  Result Value Ref Range Status   MRSA by PCR Next Gen NOT DETECTED NOT DETECTED Final    Comment: (NOTE) The GeneXpert MRSA Assay (FDA approved for NASAL specimens only), is one component of a comprehensive MRSA colonization surveillance program. It is not intended to diagnose MRSA infection nor to guide or monitor treatment for MRSA infections. Test performance is not FDA approved in patients less than 46 years old. Performed at [facility], [address], [place], [place] [address]     Antimicrobials/Microbiology: Anti-infectives (From admission, onward)    Start     Dose/Rate Route Frequency Ordered Stop   02/13/23 1640  cefTRIAXone (ROCEPHIN) 1 g in sodium chloride 0.9 % 100 mL IVPB        over 30 Minutes  Continuous PRN 02/13/23 1654 02/13/23 1640   02/10/23 1030  levofloxacin (LEVAQUIN) IVPB 750 mg        750 mg 100 mL/hr over 90 Minutes Intravenous Every 24 hours 02/10/23 0931 02/20/23 0959   02/10/23 1030  metroNIDAZOLE (FLAGYL) tablet 500 mg        500 mg Oral Every 12 hours 02/10/23 0931 02/20/23 0959   02/07/23 1445  piperacillin-tazobactam (ZOSYN) IVPB 3.375 g  Status:  Discontinued        3.375 g 12.5 mL/hr over 240 Minutes Intravenous Every 8 hours 02/07/23 1347 02/10/23 0931   02/07/23 1002  cefTRIAXone (ROCEPHIN) 2 g in sodium chloride 0.9 % 100 mL IVPB        over 30 Minutes Intravenous Continuous PRN 02/07/23 1011 02/07/23 1017   02/07/23 0945  cefTRIAXone (ROCEPHIN) 2 g in sodium chloride 0.9 % 100 mL IVPB        2 g 200 mL/hr over 30 Minutes Intravenous To Radiology 02/07/23 0845 02/08/23 0945   02/06/23 1000  amoxicillin-clavulanate (AUGMENTIN)  875-125 MG per tablet 1 tablet  Status:  Discontinued        1 tablet Oral Every 12 hours 02/05/23 1108 02/07/23 1347   02/05/23 1000  Vancomycin (VANCOCIN) 1,500 mg in sodium chloride 0.9 % 500 mL IVPB        1,500 mg 250 mL/hr over 120 Minutes Intravenous Every 24 hours 02/05/23 0903 02/05/23 1229   02/04/23 1000  vancomycin (VANCOCIN) IVPB 1000 mg/200 mL premix  Status:  Discontinued        1,000 mg 200 mL/hr over 60 Minutes Intravenous Every 24 hours 02/03/23 1122 02/05/23 0903   02/03/23 1000  Vancomycin (VANCOCIN) 1,500 mg in sodium chloride 0.9 % 500 mL IVPB  Status:  Discontinued        1,500 mg 250 mL/hr over 120 Minutes Intravenous Every 24 hours 02/02/23 1006 02/03/23 1122   02/02/23 2100  vancomycin (VANCOCIN)  IVPB 1000 mg/200 mL premix  Status:  Discontinued        1,000 mg 200 mL/hr over 60 Minutes Intravenous Every 12 hours 02/02/23 0811 02/02/23 1006   02/02/23 1100  vancomycin (VANCOCIN) IVPB 1000 mg/200 mL premix       Placed in "Followed by" Linked Group   1,000 mg 200 mL/hr over 60 Minutes Intravenous  Once 02/02/23 0913 02/02/23 1500   02/02/23 1000  vancomycin (VANCOCIN) IVPB 1000 mg/200 mL premix       Placed in "Followed by" Linked Group   1,000 mg 200 mL/hr over 60 Minutes Intravenous  Once 02/02/23 0913 02/02/23 1340   02/02/23 0900  vancomycin (VANCOREADY) IVPB 2000 mg/400 mL  Status:  Discontinued        2,000 mg 200 mL/hr over 120 Minutes Intravenous  Once 02/02/23 0804 02/02/23 0913   02/02/23 0900  piperacillin-tazobactam (ZOSYN) IVPB 3.375 g        3.375 g 12.5 mL/hr over 240 Minutes Intravenous Every 8 hours 02/02/23 0805 02/05/23 2359   02/01/23 2200  amoxicillin-clavulanate (AUGMENTIN) 875-125 MG per tablet 1 tablet  Status:  Discontinued        1 tablet Oral Every 12 hours 02/01/23 1150 02/02/23 0738   01/31/23 1058  cefOXitin (MEFOXIN) 2 g in sodium chloride 0.9 % 100 mL IVPB        over 30 Minutes  Continuous PRN 01/31/23 1058 01/31/23 1058    01/31/23 0000  cefOXitin (MEFOXIN) 2 g in sodium chloride 0.9 % 100 mL IVPB  Status:  Discontinued        2 g 200 mL/hr over 30 Minutes Intravenous To Radiology 01/30/23 1014 01/30/23 1015   01/25/23 1600  ampicillin-sulbactam (UNASYN) 1.5 g in sodium chloride 0.9 % 100 mL IVPB  Status:  Discontinued        1.5 g 200 mL/hr over 30 Minutes Intravenous Every 6 hours 01/25/23 1525 02/01/23 1150         Component Value Date/Time   SDES BLOOD LEFT HAND 02/13/2023 1122   SDES BLOOD RIGHT HAND 02/13/2023 1122   SPECREQUEST  02/13/2023 1122    BOTTLES DRAWN AEROBIC AND ANAEROBIC Blood Culture results may not be optimal due to an inadequate volume of blood received in culture bottles   SPECREQUEST  02/13/2023 1122    BOTTLES DRAWN AEROBIC AND ANAEROBIC Blood Culture results may not be optimal due to an inadequate volume of blood received in culture bottles   CULT  02/13/2023 1122    NO GROWTH < 24 HOURS Performed at [facility], [address], [place], [place] [address]    CULT  02/13/2023 1122    NO GROWTH < 24 HOURS Performed at [facility], [address], [place], [place] [address]    REPTSTATUS PENDING 02/13/2023 1122   REPTSTATUS PENDING 02/13/2023 1122    Radiology Studies: IR EXCHANGE BILIARY DRAIN Result Date: 02/14/2023 INDICATION: Patient initially diagnosed with Mirizzi syndrome, post cholecystostomy tube placement on 01/02/2023. Unfortunately, patient experienced worsening hyperbilirubinemia, ultimately undergoing attempted though unsuccessful ERCP. As such, patient underwent placement of a right-sided biliary drainage catheter on the 01/31/2023 and ultimately placement of a left-sided biliary drainage catheter on 02/07/2023. 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 Since that time, despite cholecystostomy and bilateral biliary drainage catheter placement, the patient's hyperbilirubinemia has persisted. As such, the patient  presents today for cholangiogram and potential biliary drainage catheter exchange and up sizing. EXAM: 1.  Cholangiogram via existing cholecystostomy tube. CHOLANGIOGRAM VIA EXISTING CHOLECYSTOSTOMY TUBE 2. FLUOROSCOPIC GUIDED EXCHANGE AND UP SIZING TO NOW 14 FRENCH RIGHT-SIDED BILIARY DRAINAGE CATHETER 3. FLUOROSCOPIC GUIDED EXCHANGE AND UP SIZING TO NOW 12 FRENCH LEFT-SIDED BILIARY DRAINAGE CATHETER COMPARISON:  COMPARISON Image guided left-sided biliary drainage catheter placement and brush biopsy - 02/07/2023 Image guided right-sided biliary drainage catheter placement - 01/31/2023 Cholecystostomy tube exchange - 01/24/2023 Image guided cholecystostomy tube placement - 01/02/2023 CT abdomen and pelvis - 02/06/2023; 02/02/2023 Abdominal MRI - 12/29/2022 CONTRAST:  25 mL Omnipaque-300 administered into the collecting system ANESTHESIA/SEDATION: ANESTHESIA/SEDATION 3 minutes, 6 seconds (117 mGy) FLUOROSCOPY TIME:  3 minutes, 6 seconds (1 image 17 mGy) COMPLICATIONS: None immediate. TECHNIQUE: Informed written consent was obtained from the patient after a discussion of the risks, benefits and alternatives to treatment. Questions regarding the procedure were encouraged and answered. A timeout was performed prior to the initiation of the procedure. Preprocedural spot fluoroscopic image was obtained of the right upper abdominal quadrant. Cholangiogram were performed via the right and left biliary drainage catheters as well as the cholecystostomy tube. Next, the external portion of the left lower drainage catheter was cut and cannulated with an Amplatz wire which was advanced through the biliary drainage catheter to the level of the duodenum. Under fluoroscopic guided exchange, the pre-existing 10 [place] left-sided biliary drainage catheter was exchanged for a new, slightly larger, now 64 [place] biliary drainage catheter with end coiled and locked within the duodenum. The identical procedure was performed for the  right-sided biliary drainage catheter, ultimately with exchange of the pre-existing 12 [place] biliary drainage catheter for a new, slightly larger now 6 [place] biliary drainage catheter with end coiled and locked within the duodenum. Small amount of contrast was injected via the exchanged biliary drainage catheters and post fluoroscopic and radiographic images were obtained. The biliary drainage catheters were and secured to the skin with interrupted sutures and all drainage catheters were reconnected to gravity bags. Dressings were applied. The patient tolerated the procedure well without immediate postprocedural complication. FINDINGS: Appropriately positioned and functioning cholecystostomy tube. There is persistent occlusion of the cystic duct at its mid/peripheral aspects. Appropriate positioning and functionality of both the right and left-sided biliary drainage catheters with opacification of the intrahepatic biliary system as well as passage of contrast through the catheters to the level of the duodenum. After fluoroscopic guided exchange and up sizing, both biliary drainage catheters are appropriately positioned with end coiled and locked within the duodenum. IMPRESSION: 1. Successful fluoroscopic guided exchange and up sizing of now 61 French right-sided biliary drainage catheter with end coiled and locked within the duodenum. 2. Successful fluoroscopic guided exchange and up sizing of now 69 French left-sided biliary drainage catheter with end coiled and locked within the duodenum. 3. Appropriately positioned and functioning cholecystostomy tube with persistent complete occlusion of the mid/peripheral aspect of the cystic duct. The cholecystostomy tube was not exchanged as it was most recently exchanged on 01/24/2023. PLAN: Recommend obtaining daily CMP. If patient's hyperbilirubinemia immediate does not improve in the coming days, would recommend further evaluation with repeat either contrast-enhanced  abdominal CT or MRCP as indicated. Electronically Signed   By: Simonne Come M.D.   On: 02/14/2023 07:15   IR EXCHANGE BILIARY DRAIN Result Date: 02/14/2023 INDICATION: Patient initially diagnosed with Mirizzi syndrome, post cholecystostomy tube placement on 01/02/2023. Unfortunately, patient experienced worsening hyperbilirubinemia, ultimately undergoing attempted though unsuccessful ERCP. As such, patient underwent placement of a right-sided biliary drainage catheter on the 01/31/2023 and  ultimately placement of a left-sided biliary drainage catheter on 02/07/2023. 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 Since that time, despite cholecystostomy and bilateral biliary drainage catheter placement, the patient's hyperbilirubinemia has persisted. As such, the patient presents today for cholangiogram and potential biliary drainage catheter exchange and up sizing. EXAM: 1. Cholangiogram via existing cholecystostomy tube. CHOLANGIOGRAM VIA EXISTING CHOLECYSTOSTOMY TUBE 2. FLUOROSCOPIC GUIDED EXCHANGE AND UP SIZING TO NOW 14 FRENCH RIGHT-SIDED BILIARY DRAINAGE CATHETER 3. FLUOROSCOPIC GUIDED EXCHANGE AND UP SIZING TO NOW 12 FRENCH LEFT-SIDED BILIARY DRAINAGE CATHETER COMPARISON:  COMPARISON Image guided left-sided biliary drainage catheter placement and brush biopsy - 02/07/2023 Image guided right-sided biliary drainage catheter placement - 01/31/2023 Cholecystostomy tube exchange - 01/24/2023 Image guided cholecystostomy tube placement - 01/02/2023 CT abdomen and pelvis - 02/06/2023; 02/02/2023 Abdominal MRI - 12/29/2022 CONTRAST:  25 mL Omnipaque-300 administered into the collecting system ANESTHESIA/SEDATION: ANESTHESIA/SEDATION 3 minutes, 6 seconds (117 mGy) FLUOROSCOPY TIME:  3 minutes, 6 seconds (1 image 17 mGy) COMPLICATIONS: None immediate. TECHNIQUE: Informed written consent was obtained from the patient after a discussion of the risks, benefits and alternatives to  treatment. Questions regarding the procedure were encouraged and answered. A timeout was performed prior to the initiation of the procedure. Preprocedural spot fluoroscopic image was obtained of the right upper abdominal quadrant. Cholangiogram were performed via the right and left biliary drainage catheters as well as the cholecystostomy tube. Next, the external portion of the left lower drainage catheter was cut and cannulated with an Amplatz wire which was advanced through the biliary drainage catheter to the level of the duodenum. Under fluoroscopic guided exchange, the pre-existing 10 [place] left-sided biliary drainage catheter was exchanged for a new, slightly larger, now 44 [place] biliary drainage catheter with end coiled and locked within the duodenum. The identical procedure was performed for the right-sided biliary drainage catheter, ultimately with exchange of the pre-existing 12 [place] biliary drainage catheter for a new, slightly larger now 53 [place] biliary drainage catheter with end coiled and locked within the duodenum. Small amount of contrast was injected via the exchanged biliary drainage catheters and post fluoroscopic and radiographic images were obtained. The biliary drainage catheters were and secured to the skin with interrupted sutures and all drainage catheters were reconnected to gravity bags. Dressings were applied. The patient tolerated the procedure well without immediate postprocedural complication. FINDINGS: Appropriately positioned and functioning cholecystostomy tube. There is persistent occlusion of the cystic duct at its mid/peripheral aspects. Appropriate positioning and functionality of both the right and left-sided biliary drainage catheters with opacification of the intrahepatic biliary system as well as passage of contrast through the catheters to the level of the duodenum. After fluoroscopic guided exchange and up sizing, both biliary drainage catheters are appropriately  positioned with end coiled and locked within the duodenum. IMPRESSION: 1. Successful fluoroscopic guided exchange and up sizing of now 80 French right-sided biliary drainage catheter with end coiled and locked within the duodenum. 2. Successful fluoroscopic guided exchange and up sizing of now 29 French left-sided biliary drainage catheter with end coiled and locked within the duodenum. 3. Appropriately positioned and functioning cholecystostomy tube with persistent complete occlusion of the mid/peripheral aspect of the cystic duct. The cholecystostomy tube was not exchanged as it was most recently exchanged on 01/24/2023. PLAN: Recommend obtaining daily CMP. If patient's hyperbilirubinemia immediate does not improve in the coming days, would recommend further evaluation with repeat either contrast-enhanced abdominal CT or MRCP as indicated. Electronically Signed   By: Jonny Ruiz  Watts M.D.   On: 02/14/2023 07:15   IR CHOLANGIOGRAM EXISTING TUBE Result Date: 02/14/2023 INDICATION: Patient initially diagnosed with Mirizzi syndrome, post cholecystostomy tube placement on 01/02/2023. Unfortunately, patient experienced worsening hyperbilirubinemia, ultimately undergoing attempted though unsuccessful ERCP. As such, patient underwent placement of a right-sided biliary drainage catheter on the 01/31/2023 and ultimately placement of a left-sided biliary drainage catheter on 02/07/2023. 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 Since that time, despite cholecystostomy and bilateral biliary drainage catheter placement, the patient's hyperbilirubinemia has persisted. As such, the patient presents today for cholangiogram and potential biliary drainage catheter exchange and up sizing. EXAM: 1. Cholangiogram via existing cholecystostomy tube. CHOLANGIOGRAM VIA EXISTING CHOLECYSTOSTOMY TUBE 2. FLUOROSCOPIC GUIDED EXCHANGE AND UP SIZING TO NOW 14 FRENCH RIGHT-SIDED BILIARY DRAINAGE CATHETER 3.  FLUOROSCOPIC GUIDED EXCHANGE AND UP SIZING TO NOW 12 FRENCH LEFT-SIDED BILIARY DRAINAGE CATHETER COMPARISON:  COMPARISON Image guided left-sided biliary drainage catheter placement and brush biopsy - 02/07/2023 Image guided right-sided biliary drainage catheter placement - 01/31/2023 Cholecystostomy tube exchange - 01/24/2023 Image guided cholecystostomy tube placement - 01/02/2023 CT abdomen and pelvis - 02/06/2023; 02/02/2023 Abdominal MRI - 12/29/2022 CONTRAST:  25 mL Omnipaque-300 administered into the collecting system ANESTHESIA/SEDATION: ANESTHESIA/SEDATION 3 minutes, 6 seconds (117 mGy) FLUOROSCOPY TIME:  3 minutes, 6 seconds (1 image 17 mGy) COMPLICATIONS: None immediate. TECHNIQUE: Informed written consent was obtained from the patient after a discussion of the risks, benefits and alternatives to treatment. Questions regarding the procedure were encouraged and answered. A timeout was performed prior to the initiation of the procedure. Preprocedural spot fluoroscopic image was obtained of the right upper abdominal quadrant. Cholangiogram were performed via the right and left biliary drainage catheters as well as the cholecystostomy tube. Next, the external portion of the left lower drainage catheter was cut and cannulated with an Amplatz wire which was advanced through the biliary drainage catheter to the level of the duodenum. Under fluoroscopic guided exchange, the pre-existing 10 [place] left-sided biliary drainage catheter was exchanged for a new, slightly larger, now 36 [place] biliary drainage catheter with end coiled and locked within the duodenum. The identical procedure was performed for the right-sided biliary drainage catheter, ultimately with exchange of the pre-existing 12 [place] biliary drainage catheter for a new, slightly larger now 39 [place] biliary drainage catheter with end coiled and locked within the duodenum. Small amount of contrast was injected via the exchanged biliary drainage  catheters and post fluoroscopic and radiographic images were obtained. The biliary drainage catheters were and secured to the skin with interrupted sutures and all drainage catheters were reconnected to gravity bags. Dressings were applied. The patient tolerated the procedure well without immediate postprocedural complication. FINDINGS: Appropriately positioned and functioning cholecystostomy tube. There is persistent occlusion of the cystic duct at its mid/peripheral aspects. Appropriate positioning and functionality of both the right and left-sided biliary drainage catheters with opacification of the intrahepatic biliary system as well as passage of contrast through the catheters to the level of the duodenum. After fluoroscopic guided exchange and up sizing, both biliary drainage catheters are appropriately positioned with end coiled and locked within the duodenum. IMPRESSION: 1. Successful fluoroscopic guided exchange and up sizing of now 57 French right-sided biliary drainage catheter with end coiled and locked within the duodenum. 2. Successful fluoroscopic guided exchange and up sizing of now 8 French left-sided biliary drainage catheter with end coiled and locked within the duodenum. 3. Appropriately positioned and functioning cholecystostomy tube with persistent complete occlusion of the mid/peripheral  aspect of the cystic duct. The cholecystostomy tube was not exchanged as it was most recently exchanged on 01/24/2023. PLAN: Recommend obtaining daily CMP. If patient's hyperbilirubinemia immediate does not improve in the coming days, would recommend further evaluation with repeat either contrast-enhanced abdominal CT or MRCP as indicated. Electronically Signed   By: Simonne Come M.D.   On: 02/14/2023 07:15   CT CHEST WO CONTRAST Result Date: 02/13/2023 CLINICAL DATA:  Hepatocellular carcinoma, cholangiocarcinoma, staging evaluation EXAM: CT CHEST WITHOUT CONTRAST TECHNIQUE: Multidetector CT imaging of the  chest was performed following the standard protocol without IV contrast. RADIATION DOSE REDUCTION: This exam was performed according to the departmental dose-optimization program which includes automated exposure control, adjustment of the mA and/or kV according to patient size and/or use of iterative reconstruction technique. COMPARISON:  02/06/2023 FINDINGS: Cardiovascular: Unenhanced imaging of the heart is unremarkable without pericardial effusion. Mitral valve prosthesis. 4.1 cm ascending thoracic aortic aneurysm. Evaluation of the vascular lumen is limited without IV contrast. Atherosclerosis of the aorta and coronary vasculature. Mediastinum/Nodes: No enlarged mediastinal or axillary lymph nodes. Thyroid gland, trachea, and esophagus demonstrate no significant findings. Lungs/Pleura: No acute airspace disease, effusion, or pneumothorax. Linear consolidation within the right lower lobe consistent with subsegmental atelectasis or scarring. Upper Abdomen: Percutaneous biliary drains are identified traversing the left and right lobes of the liver. Downstream extent excluded by slice selection. Percutaneous cholecystostomy tube is seen coiled within the gallbladder fossa. Minimal pneumobilia. Continued edema at the level of porta hepatis which may reflect known cholangiocarcinoma. Musculoskeletal: Large sclerotic lesions are seen within the C7 and T3 vertebral body suspicious for metastatic disease. There are chronic compression deformities of T4, T9, T11, and L1. Reconstructed images demonstrate no additional findings. IMPRESSION: 1. Large sclerotic lesions within the C7 and T3 vertebral bodies consistent with bony metastatic disease. 2. No other signs of intrathoracic metastases. 3. Stable findings within the liver consistent with known cholangiocarcinoma and indwelling biliary and gallbladder drains. Please refer to recent CT abdomen exam for full description of findings. 4. 4.1 cm ascending thoracic aortic  aneurysm. Recommend annual imaging followup by CTA or MRA. This recommendation follows 2010 ACCF/AHA/AATS/ACR/ASA/SCA/SCAI/SIR/STS/SVM Guidelines for the Diagnosis and Management of Patients with Thoracic Aortic Disease. Circulation. 2010; 121: [id number]. Aortic aneurysm NOS (ICD10-I71.9) 5. Aortic Atherosclerosis (ICD10-I70.0). Coronary artery atherosclerosis. Electronically Signed   By: Sharlet Salina M.D.   On: 02/13/2023 18:57     LOS: 19 days   Total time spent in review of labs and imaging, patient evaluation, formulation of plan, documentation and communication with family: 35 minutes  Lanae Boast, MD [facility]  02/14/2023, 10:10 AM

## 2023-02-14 NOTE — Progress Notes (Signed)
Referring Physician(s): Dr. Mosetta Putt  Supervising Physician: Marliss Coots  Patient Status:  [facility] - In-pt  Chief Complaint: Biliary obstruction  Subjective: Resting comfortably.  Food tray available with little intake-- is taking supplements.  Tbili, WBC with slight downward trend today.  No change in clinical status per patient.   Allergies: Other  Medications: Prior to Admission medications   Medication Sig Start Date End Date Taking? Authorizing Provider  amLODipine (NORVASC) 5 MG tablet TAKE 1 TABLET (5 MG TOTAL) BY MOUTH DAILY. 01/11/22  Yes [name], [name], MD  Cholecalciferol (VITAMIN D-3 PO) Take 1 capsule by mouth every evening.   Yes [provider]  divalproex (DEPAKOTE) 250 MG DR tablet Take 1 tablet (250 mg total) by mouth 2 (two) times daily. Take in addition to 500 mg twice a day (pt should have a total of 750 mg twice a day) 08/26/22  Yes Micki Riley, MD  divalproex (DEPAKOTE) 500 MG DR tablet Take 1 tablet (500 mg total) by mouth 2 (two) times daily. 05/20/22  Yes Micki Riley, MD  ELIQUIS 5 MG TABS tablet TAKE 1 TABLET BY MOUTH TWICE A DAY 08/07/22  Yes Mealor, Roberts Gaudy, MD  gabapentin (NEURONTIN) 300 MG capsule Take 1 capsule (300 mg total) by mouth at bedtime. 09/16/22  Yes Micki Riley, MD  ibandronate (BONIVA) 150 MG tablet TAKE 1 TABLET BY MOUTH EVERY 30 DAYS. TAKE IN AM WITH FULL GLASS OF WATER ON EMPTY STOMACH AND DON'T TAKE ANYTHING ELSE BY MOUTH OR LIE DOWN FOR THE NEXT 30 MINUTES Patient taking differently: Take 150 mg by mouth See admin instructions. Take 1 tablet (150mg ) once a month around the 20th. 07/22/22  Yes [name], [name], MD  LUTEIN PO Take 1 tablet by mouth every evening.   Yes [provider]  metoprolol succinate (TOPROL-XL) 25 MG 24 hr tablet TAKE 1 TABLET (25 MG TOTAL) BY MOUTH DAILY. 09/23/22  Yes BranchAlben Spittle, MD  Multiple Vitamin (MULTIVITAMIN WITH MINERALS) TABS tablet Take 1 tablet by mouth every evening.   Yes  [provider]  OVER THE COUNTER MEDICATION Take 1 tablet by mouth 2 (two) times daily. Bone Up   Yes [provider]  amoxicillin-clavulanate (AUGMENTIN) 875-125 MG tablet Take 1 tablet by mouth 2 (two) times daily. Patient not taking: Reported on 01/24/2023    [provider]     Vital Signs: BP 104/74 (BP Location: Right Arm)   Pulse 91   Temp 98.4 F (36.9 C) (Oral)   Resp 18   Ht 5\' 11"  (1.803 m)   Wt 184 lb (83.5 kg)   SpO2 98%   BMI 25.66 kg/m   Physical Exam NAD, alert Abdomen: Soft, mildly distended.  No tenderness, but generalized discomfort Perc chole drain intact.  Thin, clear output in gravity bag. Flushes easily.  RUQ biliary drain intact. There is drainage around the catheter and dressing soiled.  Cloudy, yellow output. Flushes easily.  No flush escape from insertion site.  Dressing replaced.  LUQ biliary drain intact. Insertion site clean, dry.  Drain functioning well. Dark, green output.   Imaging: IR EXCHANGE BILIARY DRAIN Result Date: 02/14/2023 INDICATION: Patient initially diagnosed with Mirizzi syndrome, post cholecystostomy tube placement on 01/02/2023. Unfortunately, patient experienced worsening hyperbilirubinemia, ultimately undergoing attempted though unsuccessful ERCP. As such, patient underwent placement of a right-sided biliary drainage catheter on the 01/31/2023 and ultimately placement of a left-sided biliary drainage catheter on 02/07/2023. 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 Since that time, despite cholecystostomy and bilateral biliary drainage catheter placement, the patient's hyperbilirubinemia has persisted. As such, the patient presents today for cholangiogram and potential biliary drainage catheter exchange and up sizing. EXAM: 1. Cholangiogram via existing cholecystostomy tube. CHOLANGIOGRAM VIA EXISTING CHOLECYSTOSTOMY TUBE 2. FLUOROSCOPIC GUIDED EXCHANGE AND UP SIZING TO NOW  14 FRENCH RIGHT-SIDED BILIARY DRAINAGE CATHETER 3. FLUOROSCOPIC GUIDED EXCHANGE AND UP SIZING TO NOW 12 FRENCH LEFT-SIDED BILIARY DRAINAGE CATHETER COMPARISON:  COMPARISON Image guided left-sided biliary drainage catheter placement and brush biopsy - 02/07/2023 Image guided right-sided biliary drainage catheter placement - 01/31/2023 Cholecystostomy tube exchange - 01/24/2023 Image guided cholecystostomy tube placement - 01/02/2023 CT abdomen and pelvis - 02/06/2023; 02/02/2023 Abdominal MRI - 12/29/2022 CONTRAST:  25 mL Omnipaque-300 administered into the collecting system ANESTHESIA/SEDATION: ANESTHESIA/SEDATION 3 minutes, 6 seconds (117 mGy) FLUOROSCOPY TIME:  3 minutes, 6 seconds (1 image 17 mGy) COMPLICATIONS: None immediate. TECHNIQUE: Informed written consent was obtained from the patient after a discussion of the risks, benefits and alternatives to treatment. Questions regarding the procedure were encouraged and answered. A timeout was performed prior to the initiation of the procedure. Preprocedural spot fluoroscopic image was obtained of the right upper abdominal quadrant. Cholangiogram were performed via the right and left biliary drainage catheters as well as the cholecystostomy tube. Next, the external portion of the left lower drainage catheter was cut and cannulated with an Amplatz wire which was advanced through the biliary drainage catheter to the level of the duodenum. Under fluoroscopic guided exchange, the pre-existing 10 [place] left-sided biliary drainage catheter was exchanged for a new, slightly larger, now 35 [place] biliary drainage catheter with end coiled and locked within the duodenum. The identical procedure was performed for the right-sided biliary drainage catheter, ultimately with exchange of the pre-existing 12 [place] biliary drainage catheter for a new, slightly larger now 71 [place] biliary drainage catheter with end coiled and locked within the duodenum. Small amount of contrast was  injected via the exchanged biliary drainage catheters and post fluoroscopic and radiographic images were obtained. The biliary drainage catheters were and secured to the skin with interrupted sutures and all drainage catheters were reconnected to gravity bags. Dressings were applied. The patient tolerated the procedure well without immediate postprocedural complication. FINDINGS: Appropriately positioned and functioning cholecystostomy tube. There is persistent occlusion of the cystic duct at its mid/peripheral aspects. Appropriate positioning and functionality of both the right and left-sided biliary drainage catheters with opacification of the intrahepatic biliary system as well as passage of contrast through the catheters to the level of the duodenum. After fluoroscopic guided exchange and up sizing, both biliary drainage catheters are appropriately positioned with end coiled and locked within the duodenum. IMPRESSION: 1. Successful fluoroscopic guided exchange and up sizing of now 65 French right-sided biliary drainage catheter with end coiled and locked within the duodenum. 2. Successful fluoroscopic guided exchange and up sizing of now 74 French left-sided biliary drainage catheter with end coiled and locked within the duodenum. 3. Appropriately positioned and functioning cholecystostomy tube with persistent complete occlusion of the mid/peripheral aspect of the cystic duct. The cholecystostomy tube was not exchanged as it was most recently exchanged on 01/24/2023. PLAN: Recommend obtaining daily CMP. If patient's hyperbilirubinemia immediate does not improve in the coming days, would recommend further evaluation with repeat either contrast-enhanced abdominal CT or MRCP as indicated. Electronically Signed   By: Simonne Come M.D.   On: 02/14/2023 07:15   IR EXCHANGE BILIARY DRAIN Result  Date: 02/14/2023 INDICATION: Patient initially diagnosed with Mirizzi syndrome, post cholecystostomy tube placement on  01/02/2023. Unfortunately, patient experienced worsening hyperbilirubinemia, ultimately undergoing attempted though unsuccessful ERCP. As such, patient underwent placement of a right-sided biliary drainage catheter on the 01/31/2023 and ultimately placement of a left-sided biliary drainage catheter on 02/07/2023. 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 Since that time, despite cholecystostomy and bilateral biliary drainage catheter placement, the patient's hyperbilirubinemia has persisted. As such, the patient presents today for cholangiogram and potential biliary drainage catheter exchange and up sizing. EXAM: 1. Cholangiogram via existing cholecystostomy tube. CHOLANGIOGRAM VIA EXISTING CHOLECYSTOSTOMY TUBE 2. FLUOROSCOPIC GUIDED EXCHANGE AND UP SIZING TO NOW 14 FRENCH RIGHT-SIDED BILIARY DRAINAGE CATHETER 3. FLUOROSCOPIC GUIDED EXCHANGE AND UP SIZING TO NOW 12 FRENCH LEFT-SIDED BILIARY DRAINAGE CATHETER COMPARISON:  COMPARISON Image guided left-sided biliary drainage catheter placement and brush biopsy - 02/07/2023 Image guided right-sided biliary drainage catheter placement - 01/31/2023 Cholecystostomy tube exchange - 01/24/2023 Image guided cholecystostomy tube placement - 01/02/2023 CT abdomen and pelvis - 02/06/2023; 02/02/2023 Abdominal MRI - 12/29/2022 CONTRAST:  25 mL Omnipaque-300 administered into the collecting system ANESTHESIA/SEDATION: ANESTHESIA/SEDATION 3 minutes, 6 seconds (117 mGy) FLUOROSCOPY TIME:  3 minutes, 6 seconds (1 image 17 mGy) COMPLICATIONS: None immediate. TECHNIQUE: Informed written consent was obtained from the patient after a discussion of the risks, benefits and alternatives to treatment. Questions regarding the procedure were encouraged and answered. A timeout was performed prior to the initiation of the procedure. Preprocedural spot fluoroscopic image was obtained of the right upper abdominal quadrant. Cholangiogram were performed via the  right and left biliary drainage catheters as well as the cholecystostomy tube. Next, the external portion of the left lower drainage catheter was cut and cannulated with an Amplatz wire which was advanced through the biliary drainage catheter to the level of the duodenum. Under fluoroscopic guided exchange, the pre-existing 10 [place] left-sided biliary drainage catheter was exchanged for a new, slightly larger, now 11 [place] biliary drainage catheter with end coiled and locked within the duodenum. The identical procedure was performed for the right-sided biliary drainage catheter, ultimately with exchange of the pre-existing 12 [place] biliary drainage catheter for a new, slightly larger now 22 [place] biliary drainage catheter with end coiled and locked within the duodenum. Small amount of contrast was injected via the exchanged biliary drainage catheters and post fluoroscopic and radiographic images were obtained. The biliary drainage catheters were and secured to the skin with interrupted sutures and all drainage catheters were reconnected to gravity bags. Dressings were applied. The patient tolerated the procedure well without immediate postprocedural complication. FINDINGS: Appropriately positioned and functioning cholecystostomy tube. There is persistent occlusion of the cystic duct at its mid/peripheral aspects. Appropriate positioning and functionality of both the right and left-sided biliary drainage catheters with opacification of the intrahepatic biliary system as well as passage of contrast through the catheters to the level of the duodenum. After fluoroscopic guided exchange and up sizing, both biliary drainage catheters are appropriately positioned with end coiled and locked within the duodenum. IMPRESSION: 1. Successful fluoroscopic guided exchange and up sizing of now 88 French right-sided biliary drainage catheter with end coiled and locked within the duodenum. 2. Successful fluoroscopic guided  exchange and up sizing of now 37 French left-sided biliary drainage catheter with end coiled and locked within the duodenum. 3. Appropriately positioned and functioning cholecystostomy tube with persistent complete occlusion of the mid/peripheral aspect of the cystic duct. The cholecystostomy tube was not exchanged as it  was most recently exchanged on 01/24/2023. PLAN: Recommend obtaining daily CMP. If patient's hyperbilirubinemia immediate does not improve in the coming days, would recommend further evaluation with repeat either contrast-enhanced abdominal CT or MRCP as indicated. Electronically Signed   By: Simonne Come M.D.   On: 02/14/2023 07:15   IR CHOLANGIOGRAM EXISTING TUBE Result Date: 02/14/2023 INDICATION: Patient initially diagnosed with Mirizzi syndrome, post cholecystostomy tube placement on 01/02/2023. Unfortunately, patient experienced worsening hyperbilirubinemia, ultimately undergoing attempted though unsuccessful ERCP. As such, patient underwent placement of a right-sided biliary drainage catheter on the 01/31/2023 and ultimately placement of a left-sided biliary drainage catheter on 02/07/2023. 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 Since that time, despite cholecystostomy and bilateral biliary drainage catheter placement, the patient's hyperbilirubinemia has persisted. As such, the patient presents today for cholangiogram and potential biliary drainage catheter exchange and up sizing. EXAM: 1. Cholangiogram via existing cholecystostomy tube. CHOLANGIOGRAM VIA EXISTING CHOLECYSTOSTOMY TUBE 2. FLUOROSCOPIC GUIDED EXCHANGE AND UP SIZING TO NOW 14 FRENCH RIGHT-SIDED BILIARY DRAINAGE CATHETER 3. FLUOROSCOPIC GUIDED EXCHANGE AND UP SIZING TO NOW 12 FRENCH LEFT-SIDED BILIARY DRAINAGE CATHETER COMPARISON:  COMPARISON Image guided left-sided biliary drainage catheter placement and brush biopsy - 02/07/2023 Image guided right-sided biliary drainage catheter  placement - 01/31/2023 Cholecystostomy tube exchange - 01/24/2023 Image guided cholecystostomy tube placement - 01/02/2023 CT abdomen and pelvis - 02/06/2023; 02/02/2023 Abdominal MRI - 12/29/2022 CONTRAST:  25 mL Omnipaque-300 administered into the collecting system ANESTHESIA/SEDATION: ANESTHESIA/SEDATION 3 minutes, 6 seconds (117 mGy) FLUOROSCOPY TIME:  3 minutes, 6 seconds (1 image 17 mGy) COMPLICATIONS: None immediate. TECHNIQUE: Informed written consent was obtained from the patient after a discussion of the risks, benefits and alternatives to treatment. Questions regarding the procedure were encouraged and answered. A timeout was performed prior to the initiation of the procedure. Preprocedural spot fluoroscopic image was obtained of the right upper abdominal quadrant. Cholangiogram were performed via the right and left biliary drainage catheters as well as the cholecystostomy tube. Next, the external portion of the left lower drainage catheter was cut and cannulated with an Amplatz wire which was advanced through the biliary drainage catheter to the level of the duodenum. Under fluoroscopic guided exchange, the pre-existing 10 [place] left-sided biliary drainage catheter was exchanged for a new, slightly larger, now 24 [place] biliary drainage catheter with end coiled and locked within the duodenum. The identical procedure was performed for the right-sided biliary drainage catheter, ultimately with exchange of the pre-existing 12 [place] biliary drainage catheter for a new, slightly larger now 57 [place] biliary drainage catheter with end coiled and locked within the duodenum. Small amount of contrast was injected via the exchanged biliary drainage catheters and post fluoroscopic and radiographic images were obtained. The biliary drainage catheters were and secured to the skin with interrupted sutures and all drainage catheters were reconnected to gravity bags. Dressings were applied. The patient tolerated the  procedure well without immediate postprocedural complication. FINDINGS: Appropriately positioned and functioning cholecystostomy tube. There is persistent occlusion of the cystic duct at its mid/peripheral aspects. Appropriate positioning and functionality of both the right and left-sided biliary drainage catheters with opacification of the intrahepatic biliary system as well as passage of contrast through the catheters to the level of the duodenum. After fluoroscopic guided exchange and up sizing, both biliary drainage catheters are appropriately positioned with end coiled and locked within the duodenum. IMPRESSION: 1. Successful fluoroscopic guided exchange and up sizing of now 52 French right-sided biliary drainage catheter with end coiled  and locked within the duodenum. 2. Successful fluoroscopic guided exchange and up sizing of now 49 French left-sided biliary drainage catheter with end coiled and locked within the duodenum. 3. Appropriately positioned and functioning cholecystostomy tube with persistent complete occlusion of the mid/peripheral aspect of the cystic duct. The cholecystostomy tube was not exchanged as it was most recently exchanged on 01/24/2023. PLAN: Recommend obtaining daily CMP. If patient's hyperbilirubinemia immediate does not improve in the coming days, would recommend further evaluation with repeat either contrast-enhanced abdominal CT or MRCP as indicated. Electronically Signed   By: Simonne Come M.D.   On: 02/14/2023 07:15   CT CHEST WO CONTRAST Result Date: 02/13/2023 CLINICAL DATA:  Hepatocellular carcinoma, cholangiocarcinoma, staging evaluation EXAM: CT CHEST WITHOUT CONTRAST TECHNIQUE: Multidetector CT imaging of the chest was performed following the standard protocol without IV contrast. RADIATION DOSE REDUCTION: This exam was performed according to the departmental dose-optimization program which includes automated exposure control, adjustment of the mA and/or kV according to  patient size and/or use of iterative reconstruction technique. COMPARISON:  02/06/2023 FINDINGS: Cardiovascular: Unenhanced imaging of the heart is unremarkable without pericardial effusion. Mitral valve prosthesis. 4.1 cm ascending thoracic aortic aneurysm. Evaluation of the vascular lumen is limited without IV contrast. Atherosclerosis of the aorta and coronary vasculature. Mediastinum/Nodes: No enlarged mediastinal or axillary lymph nodes. Thyroid gland, trachea, and esophagus demonstrate no significant findings. Lungs/Pleura: No acute airspace disease, effusion, or pneumothorax. Linear consolidation within the right lower lobe consistent with subsegmental atelectasis or scarring. Upper Abdomen: Percutaneous biliary drains are identified traversing the left and right lobes of the liver. Downstream extent excluded by slice selection. Percutaneous cholecystostomy tube is seen coiled within the gallbladder fossa. Minimal pneumobilia. Continued edema at the level of porta hepatis which may reflect known cholangiocarcinoma. Musculoskeletal: Large sclerotic lesions are seen within the C7 and T3 vertebral body suspicious for metastatic disease. There are chronic compression deformities of T4, T9, T11, and L1. Reconstructed images demonstrate no additional findings. IMPRESSION: 1. Large sclerotic lesions within the C7 and T3 vertebral bodies consistent with bony metastatic disease. 2. No other signs of intrathoracic metastases. 3. Stable findings within the liver consistent with known cholangiocarcinoma and indwelling biliary and gallbladder drains. Please refer to recent CT abdomen exam for full description of findings. 4. 4.1 cm ascending thoracic aortic aneurysm. Recommend annual imaging followup by CTA or MRA. This recommendation follows 2010 ACCF/AHA/AATS/ACR/ASA/SCA/SCAI/SIR/STS/SVM Guidelines for the Diagnosis and Management of Patients with Thoracic Aortic Disease. Circulation. 2010; 121: N829-F621. Aortic  aneurysm NOS (ICD10-I71.9) 5. Aortic Atherosclerosis (ICD10-I70.0). Coronary artery atherosclerosis. Electronically Signed   By: Sharlet Salina M.D.   On: 02/13/2023 18:57    Labs:  CBC: Recent Labs    02/08/23 0158 02/09/23 0207 02/11/23 0207 02/12/23 0217 02/14/23 0201  WBC 13.1* 11.0* 8.9  --  12.1*  HGB 9.5* 8.9* 8.1* 9.1* 9.9*  HCT 28.3* 26.8* 23.8* 27.1* 29.7*  PLT 239 274 328  --  507*    COAGS: Recent Labs    12/30/22 2151 12/31/22 0705 12/31/22 1747 01/01/23 1154 01/25/23 0548 01/31/23 0516  INR 1.3*  --   --   --  1.2 1.1  APTT 36 34 61* 43*  --   --     BMP: Recent Labs    02/11/23 0207 02/12/23 0217 02/13/23 1122 02/14/23 0201  NA 130* 128* 129* 132*  K 3.6 4.0 4.0 4.3  CL 96* 97* 96* 98  CO2 22 21* 21* 23  GLUCOSE 86 104* 116* 107*  BUN 35* 36* 42* 39*  CALCIUM 8.1* 8.6* 8.9 8.9  CREATININE 0.99 1.12 1.18 1.11  GFRNONAA >60 >60 >60 >60    LIVER FUNCTION TESTS: Recent Labs    02/11/23 0207 02/12/23 0217 02/13/23 1122 02/14/23 0201  BILITOT 12.5* 12.9* 12.2* 11.5*  AST 74* 89* 109* 120*  ALT 81* 93* 102* 104*  ALKPHOS 187* 242* 293* 310*  PROT 4.7* 5.6* 5.8* 5.7*  ALBUMIN <1.5* 1.7* 1.8* 1.8*    Assessment and Plan: Biliary obstruction s/p Right and left biliary drain, percutaneous cholecystostomy tube placement.  Patient with multiple drains in place.  All functioning well.  Some drainage noted around RUQ drain. Flushed without issue.   Drain Location: RUQ Size: Fr size: 12 Fr Date of placement: 01/02/23  Currently to: Drain collection device: gravity  Drain Location: RUQ Size: Fr size: 14 Fr Date of placement: 01/31/23 Currently to: Drain collection device: gravity   Drain Location: LUQ Size: Fr size: 12 Fr Date of placement: 02/07/23  Currently to: Drain collection device: gravity 24 hour output:  Output by Drain (mL) 02/12/23 0701 - 02/12/23 1900 02/12/23 1901 - 02/13/23 0700 02/13/23 0701 - 02/13/23 1900 02/13/23 1901  - 02/14/23 0700 02/14/23 0701 - 02/14/23 1338  Biliary Tube Cook slip-coat 12 Fr. RUQ 900 325 40 100   Biliary Tube Cook slip-coat 12 Fr. LUQ    350 75    Interval imaging/drain manipulation:  Exchange and upsize of both R and L biliary drains  Plan: Continue TID flushes with 5 cc NS. Record output Q shift. Dressing changes QD or PRN if soiled.  Call IR APP or on call IR MD if difficulty flushing or sudden change in drain output.   Discharge planning: Please contact IR APP or on call IR MD prior to patient d/c to ensure appropriate follow up plans are in place.  IR will continue to follow - please call with questions or concerns.   Electronically Signed: Hoyt Koch, PA 02/14/2023, 1:08 PM   I spent a total of 25 Minutes at the the patient's bedside AND on the patient's hospital floor or unit, greater than 50% of which was counseling/coordinating care for biliary obstruction.

## 2023-02-14 NOTE — Plan of Care (Signed)
  Problem: Activity: Goal: Risk for activity intolerance will decrease Outcome: Progressing   Problem: Nutrition: Goal: Adequate nutrition will be maintained Outcome: Progressing   Problem: Coping: Goal: Level of anxiety will decrease Outcome: Progressing   Problem: Elimination: Goal: Will not experience complications related to bowel motility Outcome: Progressing Goal: Will not experience complications related to urinary retention Outcome: Progressing   Problem: Pain Management: Goal: General experience of comfort will improve Outcome: Progressing   Problem: Safety: Goal: Ability to remain free from injury will improve Outcome: Progressing   Problem: Skin Integrity: Goal: Risk for impaired skin integrity will decrease Outcome: Progressing   Problem: Fluid Volume: Goal: Hemodynamic stability will improve Outcome: Progressing   Problem: Respiratory: Goal: Ability to maintain adequate ventilation will improve Outcome: Progressing

## 2023-02-14 NOTE — Progress Notes (Signed)
Daily Progress Note   Patient Name: Samuel Mayo       Date: 02/14/2023 DOB: 01-15-1946  Age: 77 y.o. MRN#: [id number] Attending Physician: Lanae Boast, MD Primary Care Physician: [name], [name], MD Admit Date: 01/24/2023  Reason for Consultation/Follow-up: Establishing goals of care  Length of Stay: 19  Current Medications: Scheduled Meds:   (feeding supplement) PROSource Plus  30 mL Oral BID BM   sodium chloride   Intravenous Once   apixaban  5 mg Oral BID   divalproex  750 mg Oral BID   feeding supplement  237 mL Oral QID   metroNIDAZOLE  500 mg Oral Q12H   midodrine  10 mg Oral TID WC   multivitamin with minerals  1 tablet Oral Daily   ondansetron       sodium chloride flush  10 mL Intracatheter Q8H   sodium chloride flush  3 mL Intravenous Q12H   sodium chloride flush  5 mL Intracatheter Q8H   sodium chloride flush  5 mL Intracatheter Q8H    Continuous Infusions:  levofloxacin (LEVAQUIN) IV 750 mg (02/14/23 1012)    PRN Meds: acetaminophen, diclofenac Sodium, HYDROmorphone (DILAUDID) injection, ondansetron, ondansetron (ZOFRAN) IV, polyethylene glycol, traMADol  Physical Exam Vitals reviewed.  Constitutional:      Appearance: He is ill-appearing.  HENT:     Head: Normocephalic and atraumatic.  Cardiovascular:     Rate and Rhythm: Normal rate.  Pulmonary:     Effort: Pulmonary effort is normal. Tachypnea present.  Skin:    Coloration: Skin is jaundiced.  Neurological:     Mental Status: He is alert and oriented to person, place, and time.  Psychiatric:        Mood and Affect: Affect is flat.             Vital Signs: BP 106/67 (BP Location: Right Arm)   Pulse 84   Temp 98.3 F (36.8 C) (Oral)   Resp 10   Ht 5\' 11"  (1.803 m)   Wt 83.5 kg   SpO2  98%   BMI 25.66 kg/m  SpO2: SpO2: 98 % O2 Device: O2 Device: Room Air O2 Flow Rate: O2 Flow Rate (L/min): 2 L/min    Patient Active Problem List   Diagnosis Date Noted   Palliative care by specialist 02/05/2023  Weakness generalized 02/05/2023   Hyperbilirubinemia 02/02/2023   DNR (do not resuscitate) 02/02/2023   DNR (do not resuscitate) discussion 02/02/2023   Malnutrition of moderate degree 01/31/2023   Acute biliary pancreatitis 01/27/2023   Cholecystitis 01/25/2023   Obstructive jaundice 01/24/2023   Acute cholecystitis 12/30/2022   Routine general medical examination at a health care facility 05/07/2022   Seizure (HCC) 04/26/2022   Secondary hypercoagulable state (HCC) 10/29/2021   Atrial fibrillation, chronic (HCC)    Gout, arthritis- R MTP joint. 11/26/2017   Vitamin D deficiency, unspecified 01/30/2017   Osteoporosis 10/23/2016   Hypertension 10/23/2016   Complex partial epilepsy (HCC) 09/21/2015   Status epilepticus (HCC) 09/16/2015    Palliative Care Assessment & Plan   Patient Profile: 77 y.o. male  with past medical history of a-fib / flutter on Eliquis, mitral valve annuloplasty, HTN, and seizure disorder. 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 Underwent percutaneous cholecystomy tube placed. Discharged 01/03/23 on antibiotics. Saw PCP, found to have worsening jaundice. Went for drain study 11/22 and there was non-visualization of CBD so readmitted on 11/22. GI saw on 11/23 for jaundice. MRI/MRCP showed increased mild to moderate intrahepatic bile duct dilation and new mural thickening and enhancement of the extrahepatic bile duct suspicious for cholangitis.   On 12/1,patient became febrile last night, hypotensive, tachycardic. Underlying sepsis suspected. Now clinically improving. Current plan is to continue IV antibiotics, follow-up cultures, allowing improvement in the liver function.   Today's  Discussion: Patient was alone in his room. He reports no current discomfort and reports he feels no better than when he was first admitted. His mood is flat. He discusses how long his hospitalization has been and his frustration with providers coming in and asking the same questions. Now that the biopsy has resulted he is hopeful to get a plan in place.   He understands he is not ready for discharge and will require SNF for rehab. We discussed the importance of nutrition and staying active. He reports he has little interest in food but is eating what he can. We discussed food choices that are high protein and high calorie. I suggested magic cups which he has not tried yet.The patient shares his son was working on PT exercises with him earlier today.  Patient and family face ongoing treatment option decisions, advanced directive decisions and anticipatory care needs. We plan to meet again tomorrow when the patient's wife is also there to continue discussing goals of care.    Recommendations/Plan: DNR/DNI Treat the treatable SNF at discharge Continued PMT support   Code Status:    Code Status Orders  (From admission, onward)           Start     Ordered   02/02/23 0923  Do not attempt resuscitation (DNR)- Limited -Do Not Intubate (DNI)  (Code Status)  Continuous       Question Answer Comment  If pulseless and not breathing No CPR or chest compressions.   In Pre-Arrest Conditions (Patient Is Breathing and Has A Pulse) Do not intubate. Provide all appropriate non-invasive medical interventions. Avoid ICU transfer unless indicated or required.   Consent: Discussion documented in EHR or advanced directives reviewed      02/02/23 0922         Extensive chart review has been completed prior to seeing the patient including labs, vital signs, imaging, progress/consult notes, orders, medications, and available advance directive documents.  Time spent: 35 minutes  Thank you for  allowing  the Palliative Medicine Team to assist in the care of this patient.    Sherryll Burger, NP  Please contact Palliative Medicine Team phone at [phone] for questions and concerns.

## 2023-02-14 NOTE — Progress Notes (Addendum)
Samuel Mayo   DOB:09/21/1945   ON#:[id number]      ASSESSMENT & PLAN:  1.  Adenocarcinoma, hilar cholangiocarcinoma/ Klatskin Tumor Obstructive jaundice -Cytology from biliary brushings shows adenocarcinoma -Elevated tumor markers CEA and CA 19-9, AFP normal -Unresectable per surgery -IR exchange biliary drain done 02/14/2023 -Total bili down slightly 11.5 today, from high of 17.0 a week ago.  -Recommend repeat CT A/P or MRCP if bilirubin continues to be elevated post IR exchange of biliary drain. -CT of chest done 02/13/2023 for staging shows bone mets at C7 and T3 -Patient remains with biliary and cholecystostomy drains intact -Patient's family wants to know treatment plan for patient.  If there are any changes to plan due to bone mets.   -Dr. Irie Dowson/medical oncology will discuss treatment plans with patient and family.  Patient's wife previously discussed taking patient to see Dr. Freida Busman at [facility] [place].  2.  Anemia, macrocytic -Hemoglobin 9.9 with elevated MCV 104 today -Transfuse PRBC for hemoglobin less than 7.0 -No indication to transfuse -Continue to monitor CBC daily  3.  Sepsis -Likely secondary to cholangitis -Continue antibiotics -Afebrile, monitor fever curve -Medicine following  4.  Malnutrition -Patient encouraged to increase oral intake of Ensure -Poor oral intake, continue supplements -Likely does not need a feeding tube at this time  Code Status DNR limited   Subjective:  Patient seen awake alert and oriented laying in bed.  Patient's son at bedside, has many questions.  Patient appears in better spirits today.  No acute distress noted at this time.  Objective:  Vitals:   02/14/23 0345 02/14/23 0807  BP: 103/78 95/69  Pulse: 87 98  Resp: 17 19  Temp: 97.8 F (36.6 C) 98.4 F (36.9 C)  SpO2:       Intake/Output Summary (Last 24 hours) at 02/14/2023 2440 Last data filed at 02/14/2023 0800 Gross per 24 hour  Intake 411.89 ml  Output 2275 ml  Net  -1863.11 ml     REVIEW OF SYSTEMS:   Constitutional: + Fatigue, denies fevers, chills or abnormal night sweats Eyes: Denies blurriness of vision, double vision or watery eyes Ears, nose, mouth, throat, and face: Denies mucositis or sore throat Respiratory: Denies cough, dyspnea or wheezes Cardiovascular: Denies palpitation, chest discomfort or lower extremity swelling Gastrointestinal:  + Poor appetite , denies nausea, heartburn or change in bowel habits Skin: + Yellow skin tone  Lymphatics: Denies new lymphadenopathy or easy bruising Neurological: Denies numbness, tingling or new weaknesses Behavioral/Psych: Mood is stable, no new changes  All other systems were reviewed with the patient and are negative.  PHYSICAL EXAMINATION: ECOG PERFORMANCE STATUS: 2 - Symptomatic, <50% confined to bed  Vitals:   02/14/23 0345 02/14/23 0807  BP: 103/78 95/69  Pulse: 87 98  Resp: 17 19  Temp: 97.8 F (36.6 C) 98.4 F (36.9 C)  SpO2:     Filed Weights   01/24/23 0953 02/05/23 0223 02/11/23 1055  Weight: 190 lb (86.2 kg) 181 lb 14.1 oz (82.5 kg) 184 lb (83.5 kg)    GENERAL: + Weak appearing, alert, no distress and comfortable SKIN: + Jaundiced skin color, texture, turgor are normal, no rashes or significant lesions EYES: + Scleral icterus OROPHARYNX: no exudate, no erythema and lips, buccal mucosa, and tongue normal  NECK: supple, thyroid normal size, non-tender, without nodularity LYMPH: no palpable lymphadenopathy in the cervical, axillary or inguinal LUNGS: clear to auscultation and percussion with normal breathing effort HEART: regular rate & rhythm and no murmurs and no lower  extremity edema ABDOMEN: abdomen soft, + normal bowel sounds, + multiple drains MUSCULOSKELETAL: no cyanosis of digits and no clubbing  PSYCH: alert & oriented x 3 with fluent speech NEURO: no focal motor/sensory deficits   All questions were answered. The patient knows to call the clinic with any  problems, questions or concerns.   The total time spent in the appointment was 40 minutes encounter with patient including review of chart and various tests results, discussions about plan of care and coordination of care plan  [name], NP 02/14/2023 9:28 AM    Labs Reviewed:  Lab Results  Component Value Date   WBC 12.1 (H) 02/14/2023   HGB 9.9 (L) 02/14/2023   HCT 29.7 (L) 02/14/2023   MCV 104.9 (H) 02/14/2023   PLT 507 (H) 02/14/2023   Recent Labs    12/23/22 1055 12/27/22 0946 12/30/22 1600 01/13/23 0901 01/24/23 1020 02/12/23 0217 02/13/23 1122 02/14/23 0201  NA 131* 132*   < > 132*   < > 128* 129* 132*  K 4.4 4.4   < > 4.1   < > 4.0 4.0 4.3  CL 95* 95*   < > 95*   < > 97* 96* 98  CO2 27 29   < > 28   < > 21* 21* 23  GLUCOSE 111* 116*   < > 134*   < > 104* 116* 107*  BUN 23 31*   < > 20   < > 36* 42* 39*  CREATININE 1.12 1.08   < > 1.09   < > 1.12 1.18 1.11  CALCIUM 9.1 9.0   < > 8.9   < > 8.6* 8.9 8.9  GFRNONAA  --   --    < >  --    < > >60 >60 >60  PROT 6.6 6.8   < > 6.1   < > 5.6* 5.8* 5.7*  ALBUMIN 3.6 3.5   < > 3.4*   < > 1.7* 1.8* 1.8*  AST 49* 28   < > 135*   < > 89* 109* 120*  ALT 108* 50   < > 150*   < > 93* 102* 104*  ALKPHOS 303* 362*   < > 694*   < > 242* 293* 310*  BILITOT 0.7 1.0   < > 3.9*   < > 12.9* 12.2* 11.5*  BILIDIR 0.3 0.3  --  2.5*  --   --   --   --    < > = values in this interval not displayed.    Studies Reviewed:  IR EXCHANGE BILIARY DRAIN Result Date: 02/14/2023 INDICATION: Patient initially diagnosed with Mirizzi syndrome, post cholecystostomy tube placement on 01/02/2023. Unfortunately, patient experienced worsening hyperbilirubinemia, ultimately undergoing attempted though unsuccessful ERCP. As such, patient underwent placement of a right-sided biliary drainage catheter on the 01/31/2023 and ultimately placement of a left-sided biliary drainage catheter on 02/07/2023. Unfortunately, biliary brush biopsy confirmed the  presence of a Klatskin tumor. Since that time, despite cholecystostomy and bilateral biliary drainage catheter placement, the patient's hyperbilirubinemia has persisted. As such, the patient presents today for cholangiogram and potential biliary drainage catheter exchange and up sizing. EXAM: 1. Cholangiogram via existing cholecystostomy tube. CHOLANGIOGRAM VIA EXISTING CHOLECYSTOSTOMY TUBE 2. FLUOROSCOPIC GUIDED EXCHANGE AND UP SIZING TO NOW 14 FRENCH RIGHT-SIDED BILIARY DRAINAGE CATHETER 3. FLUOROSCOPIC GUIDED EXCHANGE AND UP SIZING TO NOW 12 FRENCH LEFT-SIDED BILIARY DRAINAGE CATHETER COMPARISON:  COMPARISON Image guided left-sided biliary drainage catheter placement and brush biopsy -  02/07/2023 Image guided right-sided biliary drainage catheter placement - 01/31/2023 Cholecystostomy tube exchange - 01/24/2023 Image guided cholecystostomy tube placement - 01/02/2023 CT abdomen and pelvis - 02/06/2023; 02/02/2023 Abdominal MRI - 12/29/2022 CONTRAST:  25 mL Omnipaque-300 administered into the collecting system ANESTHESIA/SEDATION: ANESTHESIA/SEDATION 3 minutes, 6 seconds (117 mGy) FLUOROSCOPY TIME:  3 minutes, 6 seconds (1 image 17 mGy) COMPLICATIONS: None immediate. TECHNIQUE: Informed written consent was obtained from the patient after a discussion of the risks, benefits and alternatives to treatment. Questions regarding the procedure were encouraged and answered. A timeout was performed prior to the initiation of the procedure. Preprocedural spot fluoroscopic image was obtained of the right upper abdominal quadrant. Cholangiogram were performed via the right and left biliary drainage catheters as well as the cholecystostomy tube. Next, the external portion of the left lower drainage catheter was cut and cannulated with an Amplatz wire which was advanced through the biliary drainage catheter to the level of the duodenum. Under fluoroscopic guided exchange, the pre-existing 10 [place] left-sided biliary drainage  catheter was exchanged for a new, slightly larger, now 24 [place] biliary drainage catheter with end coiled and locked within the duodenum. The identical procedure was performed for the right-sided biliary drainage catheter, ultimately with exchange of the pre-existing 12 [place] biliary drainage catheter for a new, slightly larger now 65 [place] biliary drainage catheter with end coiled and locked within the duodenum. Small amount of contrast was injected via the exchanged biliary drainage catheters and post fluoroscopic and radiographic images were obtained. The biliary drainage catheters were and secured to the skin with interrupted sutures and all drainage catheters were reconnected to gravity bags. Dressings were applied. The patient tolerated the procedure well without immediate postprocedural complication. FINDINGS: Appropriately positioned and functioning cholecystostomy tube. There is persistent occlusion of the cystic duct at its mid/peripheral aspects. Appropriate positioning and functionality of both the right and left-sided biliary drainage catheters with opacification of the intrahepatic biliary system as well as passage of contrast through the catheters to the level of the duodenum. After fluoroscopic guided exchange and up sizing, both biliary drainage catheters are appropriately positioned with end coiled and locked within the duodenum. IMPRESSION: 1. Successful fluoroscopic guided exchange and up sizing of now 22 French right-sided biliary drainage catheter with end coiled and locked within the duodenum. 2. Successful fluoroscopic guided exchange and up sizing of now 49 French left-sided biliary drainage catheter with end coiled and locked within the duodenum. 3. Appropriately positioned and functioning cholecystostomy tube with persistent complete occlusion of the mid/peripheral aspect of the cystic duct. The cholecystostomy tube was not exchanged as it was most recently exchanged on 01/24/2023.  PLAN: Recommend obtaining daily CMP. If patient's hyperbilirubinemia immediate does not improve in the coming days, would recommend further evaluation with repeat either contrast-enhanced abdominal CT or MRCP as indicated. Electronically Signed   By: Simonne Come M.D.   On: 02/14/2023 07:15   IR EXCHANGE BILIARY DRAIN Result Date: 02/14/2023 INDICATION: Patient initially diagnosed with Mirizzi syndrome, post cholecystostomy tube placement on 01/02/2023. Unfortunately, patient experienced worsening hyperbilirubinemia, ultimately undergoing attempted though unsuccessful ERCP. As such, patient underwent placement of a right-sided biliary drainage catheter on the 01/31/2023 and ultimately placement of a left-sided biliary drainage catheter on 02/07/2023. 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 Since that time, despite cholecystostomy and bilateral biliary drainage catheter placement, the patient's hyperbilirubinemia has persisted. As such, the patient presents today for cholangiogram and potential biliary drainage catheter exchange and up sizing.  EXAM: 1. Cholangiogram via existing cholecystostomy tube. CHOLANGIOGRAM VIA EXISTING CHOLECYSTOSTOMY TUBE 2. FLUOROSCOPIC GUIDED EXCHANGE AND UP SIZING TO NOW 14 FRENCH RIGHT-SIDED BILIARY DRAINAGE CATHETER 3. FLUOROSCOPIC GUIDED EXCHANGE AND UP SIZING TO NOW 12 FRENCH LEFT-SIDED BILIARY DRAINAGE CATHETER COMPARISON:  COMPARISON Image guided left-sided biliary drainage catheter placement and brush biopsy - 02/07/2023 Image guided right-sided biliary drainage catheter placement - 01/31/2023 Cholecystostomy tube exchange - 01/24/2023 Image guided cholecystostomy tube placement - 01/02/2023 CT abdomen and pelvis - 02/06/2023; 02/02/2023 Abdominal MRI - 12/29/2022 CONTRAST:  25 mL Omnipaque-300 administered into the collecting system ANESTHESIA/SEDATION: ANESTHESIA/SEDATION 3 minutes, 6 seconds (117 mGy) FLUOROSCOPY TIME:  3 minutes, 6  seconds (1 image 17 mGy) COMPLICATIONS: None immediate. TECHNIQUE: Informed written consent was obtained from the patient after a discussion of the risks, benefits and alternatives to treatment. Questions regarding the procedure were encouraged and answered. A timeout was performed prior to the initiation of the procedure. Preprocedural spot fluoroscopic image was obtained of the right upper abdominal quadrant. Cholangiogram were performed via the right and left biliary drainage catheters as well as the cholecystostomy tube. Next, the external portion of the left lower drainage catheter was cut and cannulated with an Amplatz wire which was advanced through the biliary drainage catheter to the level of the duodenum. Under fluoroscopic guided exchange, the pre-existing 10 [place] left-sided biliary drainage catheter was exchanged for a new, slightly larger, now 50 [place] biliary drainage catheter with end coiled and locked within the duodenum. The identical procedure was performed for the right-sided biliary drainage catheter, ultimately with exchange of the pre-existing 12 [place] biliary drainage catheter for a new, slightly larger now 59 [place] biliary drainage catheter with end coiled and locked within the duodenum. Small amount of contrast was injected via the exchanged biliary drainage catheters and post fluoroscopic and radiographic images were obtained. The biliary drainage catheters were and secured to the skin with interrupted sutures and all drainage catheters were reconnected to gravity bags. Dressings were applied. The patient tolerated the procedure well without immediate postprocedural complication. FINDINGS: Appropriately positioned and functioning cholecystostomy tube. There is persistent occlusion of the cystic duct at its mid/peripheral aspects. Appropriate positioning and functionality of both the right and left-sided biliary drainage catheters with opacification of the intrahepatic biliary system as  well as passage of contrast through the catheters to the level of the duodenum. After fluoroscopic guided exchange and up sizing, both biliary drainage catheters are appropriately positioned with end coiled and locked within the duodenum. IMPRESSION: 1. Successful fluoroscopic guided exchange and up sizing of now 22 French right-sided biliary drainage catheter with end coiled and locked within the duodenum. 2. Successful fluoroscopic guided exchange and up sizing of now 64 French left-sided biliary drainage catheter with end coiled and locked within the duodenum. 3. Appropriately positioned and functioning cholecystostomy tube with persistent complete occlusion of the mid/peripheral aspect of the cystic duct. The cholecystostomy tube was not exchanged as it was most recently exchanged on 01/24/2023. PLAN: Recommend obtaining daily CMP. If patient's hyperbilirubinemia immediate does not improve in the coming days, would recommend further evaluation with repeat either contrast-enhanced abdominal CT or MRCP as indicated. Electronically Signed   By: Simonne Come M.D.   On: 02/14/2023 07:15   IR CHOLANGIOGRAM EXISTING TUBE Result Date: 02/14/2023 INDICATION: Patient initially diagnosed with Mirizzi syndrome, post cholecystostomy tube placement on 01/02/2023. Unfortunately, patient experienced worsening hyperbilirubinemia, ultimately undergoing attempted though unsuccessful ERCP. As such, patient underwent placement of a right-sided biliary drainage catheter on the  01/31/2023 and ultimately placement of a left-sided biliary drainage catheter on 02/07/2023. 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 Since that time, despite cholecystostomy and bilateral biliary drainage catheter placement, the patient's hyperbilirubinemia has persisted. As such, the patient presents today for cholangiogram and potential biliary drainage catheter exchange and up sizing. EXAM: 1. Cholangiogram via  existing cholecystostomy tube. CHOLANGIOGRAM VIA EXISTING CHOLECYSTOSTOMY TUBE 2. FLUOROSCOPIC GUIDED EXCHANGE AND UP SIZING TO NOW 14 FRENCH RIGHT-SIDED BILIARY DRAINAGE CATHETER 3. FLUOROSCOPIC GUIDED EXCHANGE AND UP SIZING TO NOW 12 FRENCH LEFT-SIDED BILIARY DRAINAGE CATHETER COMPARISON:  COMPARISON Image guided left-sided biliary drainage catheter placement and brush biopsy - 02/07/2023 Image guided right-sided biliary drainage catheter placement - 01/31/2023 Cholecystostomy tube exchange - 01/24/2023 Image guided cholecystostomy tube placement - 01/02/2023 CT abdomen and pelvis - 02/06/2023; 02/02/2023 Abdominal MRI - 12/29/2022 CONTRAST:  25 mL Omnipaque-300 administered into the collecting system ANESTHESIA/SEDATION: ANESTHESIA/SEDATION 3 minutes, 6 seconds (117 mGy) FLUOROSCOPY TIME:  3 minutes, 6 seconds (1 image 17 mGy) COMPLICATIONS: None immediate. TECHNIQUE: Informed written consent was obtained from the patient after a discussion of the risks, benefits and alternatives to treatment. Questions regarding the procedure were encouraged and answered. A timeout was performed prior to the initiation of the procedure. Preprocedural spot fluoroscopic image was obtained of the right upper abdominal quadrant. Cholangiogram were performed via the right and left biliary drainage catheters as well as the cholecystostomy tube. Next, the external portion of the left lower drainage catheter was cut and cannulated with an Amplatz wire which was advanced through the biliary drainage catheter to the level of the duodenum. Under fluoroscopic guided exchange, the pre-existing 10 [place] left-sided biliary drainage catheter was exchanged for a new, slightly larger, now 32 [place] biliary drainage catheter with end coiled and locked within the duodenum. The identical procedure was performed for the right-sided biliary drainage catheter, ultimately with exchange of the pre-existing 12 [place] biliary drainage catheter for a new,  slightly larger now 40 [place] biliary drainage catheter with end coiled and locked within the duodenum. Small amount of contrast was injected via the exchanged biliary drainage catheters and post fluoroscopic and radiographic images were obtained. The biliary drainage catheters were and secured to the skin with interrupted sutures and all drainage catheters were reconnected to gravity bags. Dressings were applied. The patient tolerated the procedure well without immediate postprocedural complication. FINDINGS: Appropriately positioned and functioning cholecystostomy tube. There is persistent occlusion of the cystic duct at its mid/peripheral aspects. Appropriate positioning and functionality of both the right and left-sided biliary drainage catheters with opacification of the intrahepatic biliary system as well as passage of contrast through the catheters to the level of the duodenum. After fluoroscopic guided exchange and up sizing, both biliary drainage catheters are appropriately positioned with end coiled and locked within the duodenum. IMPRESSION: 1. Successful fluoroscopic guided exchange and up sizing of now 39 French right-sided biliary drainage catheter with end coiled and locked within the duodenum. 2. Successful fluoroscopic guided exchange and up sizing of now 41 French left-sided biliary drainage catheter with end coiled and locked within the duodenum. 3. Appropriately positioned and functioning cholecystostomy tube with persistent complete occlusion of the mid/peripheral aspect of the cystic duct. The cholecystostomy tube was not exchanged as it was most recently exchanged on 01/24/2023. PLAN: Recommend obtaining daily CMP. If patient's hyperbilirubinemia immediate does not improve in the coming days, would recommend further evaluation with repeat either contrast-enhanced abdominal CT or MRCP as indicated. Electronically Signed  By: Simonne Come M.D.   On: 02/14/2023 07:15   CT CHEST WO  CONTRAST Result Date: 02/13/2023 CLINICAL DATA:  Hepatocellular carcinoma, cholangiocarcinoma, staging evaluation EXAM: CT CHEST WITHOUT CONTRAST TECHNIQUE: Multidetector CT imaging of the chest was performed following the standard protocol without IV contrast. RADIATION DOSE REDUCTION: This exam was performed according to the departmental dose-optimization program which includes automated exposure control, adjustment of the mA and/or kV according to patient size and/or use of iterative reconstruction technique. COMPARISON:  02/06/2023 FINDINGS: Cardiovascular: Unenhanced imaging of the heart is unremarkable without pericardial effusion. Mitral valve prosthesis. 4.1 cm ascending thoracic aortic aneurysm. Evaluation of the vascular lumen is limited without IV contrast. Atherosclerosis of the aorta and coronary vasculature. Mediastinum/Nodes: No enlarged mediastinal or axillary lymph nodes. Thyroid gland, trachea, and esophagus demonstrate no significant findings. Lungs/Pleura: No acute airspace disease, effusion, or pneumothorax. Linear consolidation within the right lower lobe consistent with subsegmental atelectasis or scarring. Upper Abdomen: Percutaneous biliary drains are identified traversing the left and right lobes of the liver. Downstream extent excluded by slice selection. Percutaneous cholecystostomy tube is seen coiled within the gallbladder fossa. Minimal pneumobilia. Continued edema at the level of porta hepatis which may reflect known cholangiocarcinoma. Musculoskeletal: Large sclerotic lesions are seen within the C7 and T3 vertebral body suspicious for metastatic disease. There are chronic compression deformities of T4, T9, T11, and L1. Reconstructed images demonstrate no additional findings. IMPRESSION: 1. Large sclerotic lesions within the C7 and T3 vertebral bodies consistent with bony metastatic disease. 2. No other signs of intrathoracic metastases. 3. Stable findings within the liver  consistent with known cholangiocarcinoma and indwelling biliary and gallbladder drains. Please refer to recent CT abdomen exam for full description of findings. 4. 4.1 cm ascending thoracic aortic aneurysm. Recommend annual imaging followup by CTA or MRA. This recommendation follows 2010 ACCF/AHA/AATS/ACR/ASA/SCA/SCAI/SIR/STS/SVM Guidelines for the Diagnosis and Management of Patients with Thoracic Aortic Disease. Circulation. 2010; 121: U981-X914. Aortic aneurysm NOS (ICD10-I71.9) 5. Aortic Atherosclerosis (ICD10-I70.0). Coronary artery atherosclerosis. Electronically Signed   By: Sharlet Salina M.D.   On: 02/13/2023 18:57   IR INT EXT BILIARY DRAIN WITH CHOLANGIOGRAM Result Date: 02/07/2023 INDICATION: 77 year old gentleman with history of biliary obstruction underwent percutaneous cholecystostomy placement on 01/02/2023 and replacement on 01/24/2023. Continued symptoms of jaundice and hyperbilirubinemia prompteda MRI on 01/24/2023. Enhancement of the ductal system was suspicious for cholangitis. Right hepatic biliary drain was placed on 01/31/2023. Imaging from [facility] on 01/31/2023 was suspicious for Klatskin tumor with irregular narrowing of central right and left main hepatic ducts. Despite placement of the right hepatic drain, hyperbilirubinemia and jaundice has persisted. He returns today for placement of left hepatic biliary drain as well as brush biopsy of the stenosed segment of the right hepatic duct through a pre-existing access. EXAM: 1. Ultrasound and fluoroscopy guided left hepatic internal external biliary drain placement 2. Fluoroscopy guided exchange of existing right hepatic internal external biliary drain 3. Brush biopsy of common and right hepatic bile ducts 4. Bilateral percutaneous transhepatic cholangiogram MEDICATIONS: Rocephin 2 g IV; The antibiotic was administered within an appropriate time frame prior to the initiation of the procedure. Demerol IV 50 mg ANESTHESIA/SEDATION: Moderate  (conscious) sedation was employed during this procedure. A total of Versed 3 mg and Fentanyl 75 mcg was administered intravenously by the radiology nurse. Total intra-service moderate Sedation Time: 56 minutes. The patient's level of consciousness and vital signs were monitored continuously by radiology nursing throughout the procedure under my direct supervision. FLUOROSCOPY: Radiation Exposure Index (as  provided by the fluoroscopic device): 98 mGy Kerma COMPLICATIONS: None immediate. PROCEDURE: Informed written consent was obtained from the patient after a thorough discussion of the procedural risks, benefits and alternatives. All questions were addressed. Maximal Sterile Barrier Technique was utilized including caps, mask, sterile gowns, sterile gloves, sterile drape, hand hygiene and skin antiseptic. A timeout was performed prior to the initiation of the procedure. Patient positioned supine on the procedure table. Epigastric and right upper quadrant abdominal wall skin prepped and draped in the usual sterile fashion. Sonographic evaluation of the left hepatic lobe showed mildly dilated ducts. Following local lidocaine administration, a left hepatic lobe duct was successfully accessed with a 21 gauge needle. 21 gauge needle was removed over 0.018 inch guidewire. The guidewire was advanced to the level of the common bile duct. Contrast administered through the right hepatic drain confirmed appropriate positioning within the right hepatic bile ducts and doudenum. The right-sided 45 French drain was cut and removed over 0.035 inch guidewire and replaced with an 8 [place] sheath. The 8 French sheath was advanced beyond the site of right hepatic and common bile duct stenosis. Two brush biopsies were performed at the stenotic segments of the common and right main hepatic ducts. Both samples were sent to pathology in sterile saline. New 12 French internal external biliary drain was inserted over the 0.035 inch guidewire.  The pigtail was formed within the doudenum. Attention was again turned to the left hepatic lobe. Transitional dilator set was inserted over the 0.018 inch guidewire. Kumpe catheter and stiff glidewire were advanced to the level of the doudenum utilizing fluoroscopic guidance through this access. Kumpe the catheter was removed and a 10 [place] internal external biliary drain was inserted. The distal pigtail was formed within the doudenum. Contrast administered through both drains confirmed appropriate positioning within the doudenum as well as within the intrahepatic bile ducts. Both drains were secured to skin with silk suture and connected to bag. IMPRESSION: 1. Successful brush biopsy of the stenotic segments of the common bile and right main bile ducts. 2. Successful replacement of 12 French right hepatic internal external biliary drain. 3. Successful insertion of 10 French left hepatic internal external biliary drain. Electronically Signed   By: Acquanetta Belling M.D.   On: 02/07/2023 16:36   IR ENDOLUMINAL BX OF BILIARY TREE Result Date: 02/07/2023 INDICATION: 77 year old gentleman with history of biliary obstruction underwent percutaneous cholecystostomy placement on 01/02/2023 and replacement on 01/24/2023. Continued symptoms of jaundice and hyperbilirubinemia prompteda MRI on 01/24/2023. Enhancement of the ductal system was suspicious for cholangitis. Right hepatic biliary drain was placed on 01/31/2023. 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 Despite placement of the right hepatic drain, hyperbilirubinemia and jaundice has persisted. He returns today for placement of left hepatic biliary drain as well as brush biopsy of the stenosed segment of the right hepatic duct through a pre-existing access. EXAM: 1. Ultrasound and fluoroscopy guided left hepatic internal external biliary drain placement 2. Fluoroscopy guided  exchange of existing right hepatic internal external biliary drain 3. Brush biopsy of common and right hepatic bile ducts 4. Bilateral percutaneous transhepatic cholangiogram MEDICATIONS: Rocephin 2 g IV; The antibiotic was administered within an appropriate time frame prior to the initiation of the procedure. Demerol IV 50 mg ANESTHESIA/SEDATION: Moderate (conscious) sedation was employed during this procedure. A total of Versed 3 mg and Fentanyl 75 mcg was administered intravenously by the radiology nurse. Total  intra-service moderate Sedation Time: 56 minutes. The patient's level of consciousness and vital signs were monitored continuously by radiology nursing throughout the procedure under my direct supervision. FLUOROSCOPY: Radiation Exposure Index (as provided by the fluoroscopic device): 98 mGy Kerma COMPLICATIONS: None immediate. PROCEDURE: Informed written consent was obtained from the patient after a thorough discussion of the procedural risks, benefits and alternatives. All questions were addressed. Maximal Sterile Barrier Technique was utilized including caps, mask, sterile gowns, sterile gloves, sterile drape, hand hygiene and skin antiseptic. A timeout was performed prior to the initiation of the procedure. Patient positioned supine on the procedure table. Epigastric and right upper quadrant abdominal wall skin prepped and draped in the usual sterile fashion. Sonographic evaluation of the left hepatic lobe showed mildly dilated ducts. Following local lidocaine administration, a left hepatic lobe duct was successfully accessed with a 21 gauge needle. 21 gauge needle was removed over 0.018 inch guidewire. The guidewire was advanced to the level of the common bile duct. Contrast administered through the right hepatic drain confirmed appropriate positioning within the right hepatic bile ducts and doudenum. The right-sided 47 French drain was cut and removed over 0.035 inch guidewire and replaced with an 8  [place] sheath. The 8 French sheath was advanced beyond the site of right hepatic and common bile duct stenosis. Two brush biopsies were performed at the stenotic segments of the common and right main hepatic ducts. Both samples were sent to pathology in sterile saline. New 12 French internal external biliary drain was inserted over the 0.035 inch guidewire. The pigtail was formed within the doudenum. Attention was again turned to the left hepatic lobe. Transitional dilator set was inserted over the 0.018 inch guidewire. Kumpe catheter and stiff glidewire were advanced to the level of the doudenum utilizing fluoroscopic guidance through this access. Kumpe the catheter was removed and a 10 [place] internal external biliary drain was inserted. The distal pigtail was formed within the doudenum. Contrast administered through both drains confirmed appropriate positioning within the doudenum as well as within the intrahepatic bile ducts. Both drains were secured to skin with silk suture and connected to bag. IMPRESSION: 1. Successful brush biopsy of the stenotic segments of the common bile and right main bile ducts. 2. Successful replacement of 12 French right hepatic internal external biliary drain. 3. Successful insertion of 10 French left hepatic internal external biliary drain. Electronically Signed   By: Acquanetta Belling M.D.   On: 02/07/2023 16:36   IR ENDOLUMINAL BX OF BILIARY TREE Result Date: 02/07/2023 INDICATION: 77 year old gentleman with history of biliary obstruction underwent percutaneous cholecystostomy placement on 01/02/2023 and replacement on 01/24/2023. Continued symptoms of jaundice and hyperbilirubinemia prompteda MRI on 01/24/2023. Enhancement of the ductal system was suspicious for cholangitis. Right hepatic biliary drain was placed on 01/31/2023. 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 Despite placement of  the right hepatic drain, hyperbilirubinemia and jaundice has persisted. He returns today for placement of left hepatic biliary drain as well as brush biopsy of the stenosed segment of the right hepatic duct through a pre-existing access. EXAM: 1. Ultrasound and fluoroscopy guided left hepatic internal external biliary drain placement 2. Fluoroscopy guided exchange of existing right hepatic internal external biliary drain 3. Brush biopsy of common and right hepatic bile ducts 4. Bilateral percutaneous transhepatic cholangiogram MEDICATIONS: Rocephin 2 g IV; The antibiotic was administered within an appropriate time frame prior to the initiation of the  procedure. Demerol IV 50 mg ANESTHESIA/SEDATION: Moderate (conscious) sedation was employed during this procedure. A total of Versed 3 mg and Fentanyl 75 mcg was administered intravenously by the radiology nurse. Total intra-service moderate Sedation Time: 56 minutes. The patient's level of consciousness and vital signs were monitored continuously by radiology nursing throughout the procedure under my direct supervision. FLUOROSCOPY: Radiation Exposure Index (as provided by the fluoroscopic device): 98 mGy Kerma COMPLICATIONS: None immediate. PROCEDURE: Informed written consent was obtained from the patient after a thorough discussion of the procedural risks, benefits and alternatives. All questions were addressed. Maximal Sterile Barrier Technique was utilized including caps, mask, sterile gowns, sterile gloves, sterile drape, hand hygiene and skin antiseptic. A timeout was performed prior to the initiation of the procedure. Patient positioned supine on the procedure table. Epigastric and right upper quadrant abdominal wall skin prepped and draped in the usual sterile fashion. Sonographic evaluation of the left hepatic lobe showed mildly dilated ducts. Following local lidocaine administration, a left hepatic lobe duct was successfully accessed with a 21 gauge needle.  21 gauge needle was removed over 0.018 inch guidewire. The guidewire was advanced to the level of the common bile duct. Contrast administered through the right hepatic drain confirmed appropriate positioning within the right hepatic bile ducts and doudenum. The right-sided 53 French drain was cut and removed over 0.035 inch guidewire and replaced with an 8 [place] sheath. The 8 French sheath was advanced beyond the site of right hepatic and common bile duct stenosis. Two brush biopsies were performed at the stenotic segments of the common and right main hepatic ducts. Both samples were sent to pathology in sterile saline. New 12 French internal external biliary drain was inserted over the 0.035 inch guidewire. The pigtail was formed within the doudenum. Attention was again turned to the left hepatic lobe. Transitional dilator set was inserted over the 0.018 inch guidewire. Kumpe catheter and stiff glidewire were advanced to the level of the doudenum utilizing fluoroscopic guidance through this access. Kumpe the catheter was removed and a 10 [place] internal external biliary drain was inserted. The distal pigtail was formed within the doudenum. Contrast administered through both drains confirmed appropriate positioning within the doudenum as well as within the intrahepatic bile ducts. Both drains were secured to skin with silk suture and connected to bag. IMPRESSION: 1. Successful brush biopsy of the stenotic segments of the common bile and right main bile ducts. 2. Successful replacement of 12 French right hepatic internal external biliary drain. 3. Successful insertion of 10 French left hepatic internal external biliary drain. Electronically Signed   By: Acquanetta Belling M.D.   On: 02/07/2023 16:36   IR EXCHANGE BILIARY DRAIN Result Date: 02/07/2023 INDICATION: 77 year old gentleman with history of biliary obstruction underwent percutaneous cholecystostomy placement on 01/02/2023 and replacement on 01/24/2023.  Continued symptoms of jaundice and hyperbilirubinemia prompteda MRI on 01/24/2023. Enhancement of the ductal system was suspicious for cholangitis. Right hepatic biliary drain was placed on 01/31/2023. 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 Despite placement of the right hepatic drain, hyperbilirubinemia and jaundice has persisted. He returns today for placement of left hepatic biliary drain as well as brush biopsy of the stenosed segment of the right hepatic duct through a pre-existing access. EXAM: 1. Ultrasound and fluoroscopy guided left hepatic internal external biliary drain placement 2. Fluoroscopy guided exchange of existing right hepatic internal external biliary drain 3. Brush biopsy of common  and right hepatic bile ducts 4. Bilateral percutaneous transhepatic cholangiogram MEDICATIONS: Rocephin 2 g IV; The antibiotic was administered within an appropriate time frame prior to the initiation of the procedure. Demerol IV 50 mg ANESTHESIA/SEDATION: Moderate (conscious) sedation was employed during this procedure. A total of Versed 3 mg and Fentanyl 75 mcg was administered intravenously by the radiology nurse. Total intra-service moderate Sedation Time: 56 minutes. The patient's level of consciousness and vital signs were monitored continuously by radiology nursing throughout the procedure under my direct supervision. FLUOROSCOPY: Radiation Exposure Index (as provided by the fluoroscopic device): 98 mGy Kerma COMPLICATIONS: None immediate. PROCEDURE: Informed written consent was obtained from the patient after a thorough discussion of the procedural risks, benefits and alternatives. All questions were addressed. Maximal Sterile Barrier Technique was utilized including caps, mask, sterile gowns, sterile gloves, sterile drape, hand hygiene and skin antiseptic. A timeout was performed prior to the initiation of the  procedure. Patient positioned supine on the procedure table. Epigastric and right upper quadrant abdominal wall skin prepped and draped in the usual sterile fashion. Sonographic evaluation of the left hepatic lobe showed mildly dilated ducts. Following local lidocaine administration, a left hepatic lobe duct was successfully accessed with a 21 gauge needle. 21 gauge needle was removed over 0.018 inch guidewire. The guidewire was advanced to the level of the common bile duct. Contrast administered through the right hepatic drain confirmed appropriate positioning within the right hepatic bile ducts and doudenum. The right-sided 40 French drain was cut and removed over 0.035 inch guidewire and replaced with an 8 [place] sheath. The 8 French sheath was advanced beyond the site of right hepatic and common bile duct stenosis. Two brush biopsies were performed at the stenotic segments of the common and right main hepatic ducts. Both samples were sent to pathology in sterile saline. New 12 French internal external biliary drain was inserted over the 0.035 inch guidewire. The pigtail was formed within the doudenum. Attention was again turned to the left hepatic lobe. Transitional dilator set was inserted over the 0.018 inch guidewire. Kumpe catheter and stiff glidewire were advanced to the level of the doudenum utilizing fluoroscopic guidance through this access. Kumpe the catheter was removed and a 10 [place] internal external biliary drain was inserted. The distal pigtail was formed within the doudenum. Contrast administered through both drains confirmed appropriate positioning within the doudenum as well as within the intrahepatic bile ducts. Both drains were secured to skin with silk suture and connected to bag. IMPRESSION: 1. Successful brush biopsy of the stenotic segments of the common bile and right main bile ducts. 2. Successful replacement of 12 French right hepatic internal external biliary drain. 3. Successful  insertion of 10 French left hepatic internal external biliary drain. Electronically Signed   By: Acquanetta Belling M.D.   On: 02/07/2023 16:36   CT ABDOMEN W CONTRAST Result Date: 02/07/2023 CLINICAL DATA:  77 year old male with a history of biliary obstruction. The original ultrasound 12/26/2022 demonstrates evidence of acute cholecystitis, which was present on a follow-up MRI 12/29/2022. Following this a percutaneous cholecystostomy was placed 01/02/2023 and replaced 01/24/2023. Ongoing symptoms of jaundice and hyperbilirubinemia prompted a second MRI 01/24/2023. MR suggested cholangitis given some enhancement along the ductal system, undrained intrahepatic bile ducts. Internal external biliary drain was placed 01/31/2023. 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 A follow-up CT 01/31/2023 and 02/02/2023 performed,  with no radiopaque gallstones accounting for biliary obstruction. The patient has had ongoing hyperbilirubinemia which has plateaued at the level of the proximally 14 and remains symptomatic. He presents for this CT study as a planning study preoperatively 4 left-sided biliary drainage. EXAM: CT ABDOMEN WITH CONTRAST TECHNIQUE: Multidetector CT imaging of the abdomen was performed using the standard protocol following bolus administration of intravenous contrast. RADIATION DOSE REDUCTION: This exam was performed according to the departmental dose-optimization program which includes automated exposure control, adjustment of the mA and/or kV according to patient size and/or use of iterative reconstruction technique. CONTRAST:  75mL OMNIPAQUE IOHEXOL 350 MG/ML SOLN COMPARISON:  Ultrasound 12/26/2022, MR 12/29/2022, per coli 01/02/2023, MR 01/24/2023, CT 01/31/2023, CT 02/02/2023 PTC and drainage 01/31/2023 FINDINGS: Lower chest: No acute finding of the lower  chest. Minimal atelectasis and trace fluid. Hepatobiliary: Left-sided intrahepatic biliary ductal dilatation. There is associated periportal edema. There are a few small mildly dilated intrahepatic ducts of the right liver, particularly of the segment 8. Percutaneous transhepatic internal/external biliary drain via right-sided approach remains adequately position with the radiopaque marker in the bile ducts and the term in a shin of the drain in the small bowel. Percutaneous cholecystostomy tube in place within the fundus of the gallbladder. No radiopaque stones. There are no radiopaque stones identified along the course of the internal/external biliary drain. Region of hypodensity/hypoenhancement at the confluence of the left and right biliary ducts, at the liver hilum on image 19 of series 3. This region measures 13 mm x 42 mm on the axial images. No cirrhotic changes Pancreas: Attenuation/enhancement of the pancreas within normal limits. Mild ductal dilatation of the pancreatic duct. Spleen: Unremarkable Adrenals/Urinary Tract: - Right adrenal gland:  Unremarkable - Left adrenal gland: Unremarkable. - Right kidney: No hydronephrosis, nephrolithiasis, inflammation, or ureteral dilation. No focal lesion. - Left Kidney: No hydronephrosis, nephrolithiasis, inflammation, or ureteral dilation. No focal lesion. Stomach/Bowel: - Stomach: Small hiatal hernia.  Otherwise unremarkable stomach. 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 Internal/external biliary drain terminates in the duodenum. Visualized small bowel and colon unremarkable with no distension. Vascular/Lymphatic: Calcifications of the abdominal aorta. Mesenteric arteries and renal arteries patent. Other: None Musculoskeletal: Unchanged configuration of the visualized vertebral bodies including compression fracture of L1 favored to be chronic. No bony canal narrowing. IMPRESSION: Right-sided internal/external biliary  drain in adequate position across the right-sided biliary ducts and into the duodenum. The majority of the right-sided ductal system is decompressed, with some mild residual ductal dilatation involving segment 8. Persistent left-sided intrahepatic biliary ductal dilatation. Nonspecific focus of low attenuation/hypoenhancement at the confluence of the left and right-sided biliary ducts in the liver hilum. This may reflect focal edema, however, a Klatskin tumor cannot be excluded as etiology for the ductal dilatation. Percutaneous cholecystostomy in position in the gallbladder. Aortic Atherosclerosis (ICD10-I70.0). Electronically Signed   By: Gilmer Mor D.O.   On: 02/07/2023 11:21   DG CHEST PORT 1 VIEW Result Date: 02/02/2023 CLINICAL DATA:  Fever EXAM: PORTABLE CHEST 1 VIEW COMPARISON:  12/26/2022 x-ray FINDINGS: Status post median sternotomy. Enlarged cardiopericardial silhouette with calcified tortuous aorta. Prosthetic valve. Overlapping cardiac leads. There is some linear opacity at the bases likely scar or atelectasis. No pneumothorax, effusion or edema. Healed right eighth rib fracture. IMPRESSION: Postop chest with enlarged heart and calcified aorta. Basilar atelectasis Electronically Signed   By: Karen Kays M.D.   On: 02/02/2023 16:08   CT ABDOMEN PELVIS WO CONTRAST Result Date: 02/02/2023 CLINICAL  DATA:  Anemia. EXAM: CT ABDOMEN AND PELVIS WITHOUT CONTRAST TECHNIQUE: Multidetector CT imaging of the abdomen and pelvis was performed following the standard protocol without IV contrast. RADIATION DOSE REDUCTION: This exam was performed according to the departmental dose-optimization program which includes automated exposure control, adjustment of the mA and/or kV according to patient size and/or use of iterative reconstruction technique. COMPARISON:  January 31, 2023. FINDINGS: Lower chest: Minimal bibasilar subsegmental atelectasis is noted. Hepatobiliary: Percutaneous cholecystostomy tube is again  noted with decompression of the bladder. Stable position of internal external biliary drain with distal tip in duodenum. Stable mild left hepatic biliary dilatation with periportal edema. Pancreas: Unremarkable. No pancreatic ductal dilatation or surrounding inflammatory changes. Spleen: Normal in size without focal abnormality. Adrenals/Urinary Tract: Adrenal glands are unremarkable. Kidneys are normal, without renal calculi, focal lesion, or hydronephrosis. Bladder is unremarkable. Stomach/Bowel: Stomach is within normal limits. Appendix appears normal. No evidence of bowel wall thickening, distention, or inflammatory changes. Vascular/Lymphatic: Aortic atherosclerosis. No enlarged abdominal or pelvic lymph nodes. Reproductive: Mild prostatic enlargement is noted. Other: No abdominal wall hernia or abnormality. No abdominopelvic ascites. Musculoskeletal: Old L1 and L5 fractures are again noted. No acute abnormality seen. IMPRESSION: Percutaneous cholecystostomy tube is again noted. Stable position of internal external biliary drain with distal tip in duodenum. Stable mild left hepatic biliary dilatation with periportal edema. Electronically Signed   By: Lupita Raider M.D.   On: 02/02/2023 13:43   CT ABDOMEN PELVIS W CONTRAST Result Date: 01/31/2023 CLINICAL DATA:  Gallbladder and biliary cancer. EXAM: CT ABDOMEN AND PELVIS WITH CONTRAST TECHNIQUE: Multidetector CT imaging of the abdomen and pelvis was performed using the standard protocol following bolus administration of intravenous contrast. RADIATION DOSE REDUCTION: This exam was performed according to the departmental dose-optimization program which includes automated exposure control, adjustment of the mA and/or kV according to patient size and/or use of iterative reconstruction technique. CONTRAST:  75mL OMNIPAQUE IOHEXOL 350 MG/ML SOLN COMPARISON:  MRI abdomen 01/24/2023. FINDINGS: Lower chest: There is atelectasis in the lung bases with trace  bilateral pleural effusions. Hepatobiliary: Transhepatic percutaneous biliary catheter present with distal tip in the duodenal. There is mild intrahepatic biliary ductal dilatation in the left lobe of the liver with some periportal edema similar to prior MRI given differences in technique. There is a small amount of pneumobilia and contrast throughout the biliary tree likely related to recent procedure. No focal liver lesions are seen. Percutaneous cholecystostomy tube present. There some contrast in the gallbladder. The gallbladder is decompressed and there is diffuse wall thickening similar to prior. Pancreas: Unremarkable. No pancreatic ductal dilatation or surrounding inflammatory changes. Spleen: Normal in size without focal abnormality. Adrenals/Urinary Tract: Adrenal glands are unremarkable. Kidneys are normal, without renal calculi, focal lesion, or hydronephrosis. Bladder is unremarkable. Stomach/Bowel: Stomach is within normal limits. Appendix appears normal. No evidence of bowel wall thickening, distention, or inflammatory changes. There is sigmoid colon diverticulosis. Vascular/Lymphatic: No significant vascular findings are present. No enlarged abdominal or pelvic lymph nodes. Reproductive: Prostate gland is enlarged. Other: There is presacral edema. There is trace free fluid in the right upper quadrant similar to prior. There is no focal abdominal wall hernia. Musculoskeletal: There are compression deformities of T8, T9, T11, L1 and L5 which are favored as chronic, but age indeterminate. The bones are diffusely osteopenic. There is mild body wall edema. IMPRESSION: 1. Transhepatic percutaneous biliary catheter present with distal tip in the duodenum. 2. Percutaneous cholecystostomy tube present. Gallbladder is decompressed with diffuse wall thickening  similar to prior. 3. Mild intrahepatic biliary ductal dilatation in the left lobe of the liver with periportal edema similar to prior MRI. 4. Trace free  fluid in the right upper quadrant similar to prior. 5. Trace bilateral pleural effusions. 6. Mild body wall edema. 7. Multiple compression deformities of the thoracic and lumbar spine are favored as chronic, but age indeterminate. Correlate clinically. Electronically Signed   By: Darliss Cheney M.D.   On: 01/31/2023 20:37   IR BILIARY DRAIN PLACEMENT WITH CHOLANGIOGRAM Result Date: 01/31/2023 INDICATION: 77 year old male with biliary obstruction, presents for percutaneous transhepatic cholangiogram and internal/external drain placement EXAM: IMAGE GUIDED PERCUTANEOUS TRANSHEPATIC CHOLANGIOGRAM INTERNAL/EXTERNAL BILIARY DRAIN PLACEMENT MEDICATIONS: 2 g Mefoxin; The antibiotic was administered within an appropriate time frame prior to the initiation of the procedure. ANESTHESIA/SEDATION: Moderate (conscious) sedation was employed during this procedure. A total of Versed 2 mg and Fentanyl 150 mcg was administered intravenously by the radiology nurse. Total intra-service moderate Sedation Time: 43 minutes. The patient's level of consciousness and vital signs were monitored continuously by radiology nursing throughout the procedure under my direct supervision. FLUOROSCOPY: Radiation Exposure Index (as provided by the fluoroscopic device): 282 mGy Kerma COMPLICATIONS: None PROCEDURE: The procedure, risks, benefits, and alternatives were explained to the patient and the patient's family. A complete informed consent was performed, with risk benefit analysis. Specific risks that were discussed for the procedure include bleeding, infection, biliary sepsis, IC use day, organ injury, need for further procedure, need for further surgery, long-term drain placement, cardiopulmonary collapse, death. Questions regarding the procedure were encouraged and answered. The patient understands and consents to the procedure. Patient is position in supine position on the fluoroscopy table, and the upper abdomen was prepped and draped in  the usual sterile fashion. Maximum barrier sterile technique with sterile gowns and gloves were used for the procedure. A timeout was performed prior to the initiation of the procedure. Local anesthesia was provided with 1% lidocaine with epinephrine. Ultrasound survey of the left liver lobe was performed, with then ultrasound of the right liver lobe. We elected to access transhepatic biliary system via the right. 1% lidocaine was used for local anesthesia, with generous infiltration of the skin and subcutaneous tissues in and inter left costal location. A Chiba needle was advanced under ultrasound guidance into the right liver lobe, targeting biliary system. Once the tip of the needle was confirmed within the biliary system by injecting small aliquots of contrast, images were stored of the biliary system after partially opacifying the biliary tree via the needle. An 018 wire was then advanced centrally. The needle was removed, a small incision was made with an 11 blade scalpel, and then a triaxial Bard system was advanced into the biliary system. The metal stiffener and dilator were removed, we confirmed placement with contrast infusion. We then attempted to navigate through stenotic ductal system at the hilum, presumably the common hepatic duct. A coaxial Glidewire and 4 French glide cath were then used to attempt to navigate across the obstruction at the hilum of the liver. A combination of the glide cath, Glidewire, and a roadrunner wire were required to access the common hepatic duct. Once the catheter and wire were advanced into the external hepatic ducts, further images were acquired. This demonstrated that the duodenal diverticulum was distorting the anatomy at the ampulla. After some initial attempts fail to cross the ampulla, a coon's wire was placed and then a 6 French 35 cm straight sheath was placed into the common bile duct.  At this point we were able to navigate the angled diagnostic catheter and the  Glidewire through the ampulla into the duodenum. With the diagnostic catheter in the second portion the duodenum, the coon's wire was then placed. Twelve French dilation of the subcutaneous tissue tracks was performed, and then a 25 [place] biliary drain was placed as an internal/external biliary drain. Small amount of contrast confirmed location. The patient tolerated the procedure well and remained hemodynamically stable throughout. No complications were encountered and no significant blood loss was encountered. FINDINGS: Percutaneous transhepatic cholangiogram demonstrates significant stenotic narrowing in the bile ducts at the hilum of the liver, suspicious for malignancy/Klatskin tumor IMPRESSION: Status post image guided percutaneous transhepatic cholangiogram with internal/external biliary drain placement. Injection of the percutaneous cholecystostomy tube demonstrates that the cystic duct remains occluded. Signed, Yvone Neu. Miachel Roux, RPVI [facility] Radiology Electronically Signed   By: Gilmer Mor D.O.   On: 01/31/2023 16:46   DG C-Arm 1-60 Min-No Report Result Date: 01/29/2023 Fluoroscopy was utilized by the requesting physician.  No radiographic interpretation.   MR ABDOMEN MRCP W WO CONTAST Result Date: 01/24/2023 CLINICAL DATA:  Jaundice. History of acute cholecystitis status post percutaneous cholecystostomy tube placement 01/02/2023 EXAM: MRI ABDOMEN WITHOUT AND WITH CONTRAST (INCLUDING MRCP) TECHNIQUE: Multiplanar multisequence MR imaging of the abdomen was performed both before and after the administration of intravenous contrast. Heavily T2-weighted images of the biliary and pancreatic ducts were obtained. Post-processing was applied at the acquisition scanner with concurrent physician supervision which includes 3D reconstructions, MIPs, volume rendered images and/or shaded surface rendering. CONTRAST:  8.23mL GADAVIST GADOBUTROL 1  MMOL/ML IV SOLN COMPARISON:  MR abdomen dated 12/29/2022 FINDINGS: Decreased sensitivity and specificity for detailed findings due to motion artifact. Lower chest: Trace bilateral pleural effusions. Hepatobiliary: Parenchymal signal abnormality. Scattered subcentimeter T2 hyperintense foci, likely cysts. Increased mild to moderate intrahepatic bile duct dilation more prominent in the left hepatic lobe with new mural thickening of the extrahepatic bile duct (4:16) and enhancement (1304:45). The common bile duct is nondilated. Gallbladder is decompressed with catheter in-situ and demonstrates circumferential mural thickening. Pancreas: No mass or inflammatory changes. 3 mm T2 hyperintense cystic focus arises directly from the main pancreatic duct in the pancreatic neck (6:20) keeping with side branch intraductal papillary mucinous neoplasms (IPMN). No main ductal dilation, mass lesion, or abnormal enhancement. Spleen:  Within normal limits in size and appearance. Adrenals/Urinary Tract: No adrenal nodules. No suspicious renal masses identified. No evidence of hydronephrosis. Stomach/Bowel: Small proximal duodenal diverticulum. Colonic diverticulosis without acute diverticulitis. Vascular/Lymphatic: No pathologically enlarged lymph nodes identified. No abdominal aortic aneurysm demonstrated. Other:  Trace ascites. Musculoskeletal: No suspicious bone lesions identified. Susceptibility artifact related to median sternotomy wires. IMPRESSION: 1. Increased mild to moderate intrahepatic bile duct dilation, more prominent in the left hepatic lobe, with new mural thickening and enhancement of the extrahepatic bile duct, suspicious for cholangitis. 2. Decompressed gallbladder with catheter in-situ and circumferential mural thickening. 3. Trace bilateral pleural effusions and trace ascites. Electronically Signed   By: Agustin Cree M.D.   On: 01/24/2023 15:54   MR 3D Recon At Scanner Result Date: 01/24/2023 CLINICAL DATA:   Jaundice. History of acute cholecystitis status post percutaneous cholecystostomy tube placement 01/02/2023 EXAM: MRI ABDOMEN WITHOUT AND WITH CONTRAST (INCLUDING MRCP) TECHNIQUE: Multiplanar multisequence MR imaging of the abdomen was performed both before and after the administration of intravenous contrast. Heavily T2-weighted images of the biliary and pancreatic ducts were obtained. Post-processing was applied  at the acquisition scanner with concurrent physician supervision which includes 3D reconstructions, MIPs, volume rendered images and/or shaded surface rendering. CONTRAST:  8.53mL GADAVIST GADOBUTROL 1 MMOL/ML IV SOLN COMPARISON:  MR abdomen dated 12/29/2022 FINDINGS: Decreased sensitivity and specificity for detailed findings due to motion artifact. Lower chest: Trace bilateral pleural effusions. Hepatobiliary: Parenchymal signal abnormality. Scattered subcentimeter T2 hyperintense foci, likely cysts. Increased mild to moderate intrahepatic bile duct dilation more prominent in the left hepatic lobe with new mural thickening of the extrahepatic bile duct (4:16) and enhancement (1304:45). The common bile duct is nondilated. Gallbladder is decompressed with catheter in-situ and demonstrates circumferential mural thickening. Pancreas: No mass or inflammatory changes. 3 mm T2 hyperintense cystic focus arises directly from the main pancreatic duct in the pancreatic neck (6:20) keeping with side branch intraductal papillary mucinous neoplasms (IPMN). No main ductal dilation, mass lesion, or abnormal enhancement. Spleen:  Within normal limits in size and appearance. Adrenals/Urinary Tract: No adrenal nodules. No suspicious renal masses identified. No evidence of hydronephrosis. Stomach/Bowel: Small proximal duodenal diverticulum. Colonic diverticulosis without acute diverticulitis. Vascular/Lymphatic: No pathologically enlarged lymph nodes identified. No abdominal aortic aneurysm demonstrated. Other:  Trace  ascites. Musculoskeletal: No suspicious bone lesions identified. Susceptibility artifact related to median sternotomy wires. IMPRESSION: 1. Increased mild to moderate intrahepatic bile duct dilation, more prominent in the left hepatic lobe, with new mural thickening and enhancement of the extrahepatic bile duct, suspicious for cholangitis. 2. Decompressed gallbladder with catheter in-situ and circumferential mural thickening. 3. Trace bilateral pleural effusions and trace ascites. Electronically Signed   By: Agustin Cree M.D.   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 Patient now with worsening hyperbilirubinemia and jaundice and as such presents for cholangiogram and potential cholecystostomy tube exchange. EXAM: FLUOROSCOPIC GUIDED CHOLECYSTOSTOMY TUBE EXCHANGE COMPARISON:  MRCP-12/29/2022 Image guided cholecystostomy tube placement-01/02/2023 MEDICATIONS: None ANESTHESIA/SEDATION: None CONTRAST:  15mL OMNIPAQUE IOHEXOL 300 MG/ML SOLN - administered into the gallbladder lumen. FLUOROSCOPY TIME:  1 minute, 6 seconds (6.1 mGy) COMPLICATIONS: None immediate. PROCEDURE: The patient was positioned supine on the fluoroscopy table. The external portion of the existing cholecystostomy tube as well as the surrounding skin was prepped and draped in usual sterile fashion. A time-out was performed prior to the initiation of the procedure. A preprocedural spot fluoroscopic image was obtained of the right upper abdominal quadrant existing cholecystostomy tube. The skin surrounding the cholecystostomy tube was anesthetized with 1% lidocaine with epinephrine. The external portion of the cholecystostomy tube was cut and cannulated with a short Amplatz wire which was advanced through the tube and coiled within the gallbladder lumen Next, under  intermittent fluoroscopic guidance, the existing 10 [place] cholecystostomy tube was exchanged for a new, slightly larger now 10 [place] cholecystostomy tube which was repositioned into the more central aspect of the gallbladder lumen. Contrast injection confirms appropriate positioning and functionality of the cholecystomy tube. The cholecystostomy tube was flushed with a small amount of saline and reconnected to a gravity bag. The cholecystostomy tube was secured with an interrupted suture and a Stat Lock device. A dressing was applied. The patient tolerated the procedure well without immediate postprocedural complication. FINDINGS: Preprocedural spot fluoroscopic image demonstrates unchanged positioning of cholecystostomy tube with end coiled and locked over the expected location of the fundus of the gallbladder. Contrast injection demonstrates appropriate positioning and functionality of the existing cholecystostomy tube. There is opacification of  the peripheral aspect of the cystic duct without passage of contrast to the level of the CBD. After fluoroscopic guided exchange, the new, 10 [place] cholecystostomy tube is appropriately positioned within the gallbladder lumen. Post exchange cholangiogram demonstrates appropriate positioning and functionality of the new cholecystostomy tube. IMPRESSION: Successful fluoroscopic guided exchange of 10 French cholecystostomy tube. PLAN: - Cholangiogram demonstrates persistent occlusion of the cystic duct without opacification of the CBD. 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 The patient is visibly jaundiced with worsening hyperbilirubinemia (bilirubin - 3.9 on 11/11, 0.6 on the day of cholecystostomy tube placement) and as such the decision was made to escort the patient to the emergency department for further evaluation and management as the patient will likely require an ERCP to  relieve his worsening biliary obstruction. Electronically Signed   By: Simonne Come M.D.   On: 01/24/2023 11:06   Addendum I have seen the patient, examined him. I agree with the assessment and and plan and have edited the notes.   I personally reviewed his CT chest images, which showed large sclerotic lesion in C7 and T3 vertebral bodies, no other signs of intrathoracic metastasis.  Patient is not symptomatic with back pain.  I reviewed the findings with patient and his son, and expressed my concern of probable metastatic disease.  I will discuss with IR to see if they can biopsy one of the bone lesions.  Giving sclerotic mets is not very common in cholangiocarcinoma, and he does not have BPH symptoms, I will obtain a PSA, to rule out metastatic prostate cancer.  I will also recommend outpatient PET scan to further evaluate his bone lesions.  I explained to the patient and his son that he is currently not a candidate for chemotherapy or surgery, and that we have time to figure this out.  He understands the incurable nature and overall poor prognosis if this is metastatic disease to bone.  He agrees with the plan.  He will likely go to rehab soon.  His son will call my office after his discharge, so I can schedule his PET scan.  All questions were answered.  Malachy Mood MD 02/14/2023

## 2023-02-14 NOTE — Plan of Care (Signed)

## 2023-02-14 NOTE — TOC CM/SW Note (Addendum)
Transition of Care [facility]) - Inpatient Brief Assessment   Patient Details  Name: Samuel Mayo MRN: [id number] Date of Birth: [birth year]  Transition of Care [facility]) CM/SW Contact:    Marliss Coots, LCSW Phone Number: 02/14/2023, 12:35 PM   Clinical Narrative:  12:36 PM Per progression, patient continues to not be medically stable for discharge to [facility] via PTAR. Insurance authorization for SNF expired. CSW submitted new insurance authorization (reference # [id number]) which is currently pending.  Transition of Care Asessment: Insurance and Status: Insurance coverage has been reviewed Patient has primary care physician: Yes Home environment has been reviewed: DC to SNF Prior level of function:: Independent/Modified Independent Prior/Current Home Services: No current home services Social Drivers of Health Review: SDOH reviewed no interventions necessary Readmission risk has been reviewed: Yes Transition of care needs: transition of care needs identified, TOC will continue to follow (PTAR for dc to [facility])

## 2023-02-14 NOTE — Progress Notes (Signed)
Physical Therapy Treatment Patient Details Name: Samuel Mayo MRN: [id number] DOB: 01/07/1946 Today's Date: 02/14/2023   History of Present Illness 77 y.o. male presents to [facility] 01/24/23 with worsening jaundice and follow up imaging from recent admit that showed non-visualization of CBD. MRI/MRCP showed increased mild to moderate intrahepatic bile duct dilation and new mural thickening and enhancement of the extrahepatic bile duct suspicious for cholangitis. IR performed PTC 11/29 with placement of internal/external drain. 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 Recent admit from 10/28 -11/1 for cholecystitis, diagnosed with Mirizzi syndrome ,underwent cholecystostomy. PMHx: seizure disorder on depakote, PAF, HTN, a-fib, acute biliary pancreatitis, cholecystitis, gout    PT Comments  Slow progress towards goals. Pt has an initial increase in BP when sitting EOB and then a decrease when standing EOB; pt was asymptomatic today. MAP above 60 and leveled out around 80's/60's after 3 min standing and short distance gait. Pt continues to require CGA for bed mobility and Min a for sit to stand/gait. Pt has improved eccentric movement this session for sitting. Due to pt current functional status, home set up and available assistance at home recommending skilled physical therapy services < 3 hours/day in order to address strength, balance and functional mobility to decrease risk for falls, injury, immobility, skin break down and re-hospitalization.     If plan is discharge home, recommend the following: A little help with walking and/or transfers;Help with stairs or ramp for entrance;Assist for transportation   Can travel by private vehicle     Yes  Equipment Recommendations  Rollator (4 wheels)       Precautions / Restrictions Precautions Precautions: Fall Precaution Comments: biliary tubes x 3 RUQ, orthostatic hypotension Restrictions Weight Bearing Restrictions Per Provider  Order: No     Mobility  Bed Mobility Overal bed mobility: Needs Assistance Bed Mobility: Supine to Sit, Sit to Supine     Supine to sit: Contact guard Sit to supine: Contact guard assist        Transfers Overall transfer level: Needs assistance Equipment used: Rolling walker (2 wheels) Transfers: Sit to/from Stand Sit to Stand: Min assist           General transfer comment: Cues for hand placement and safety, pt initially attempting to pull up on RW handles. Improved eccentric control when sitting.    Ambulation/Gait Ambulation/Gait assistance: Min assist Gait Distance (Feet): 30 Feet Assistive device: Rolling walker (2 wheels) Gait Pattern/deviations: Step-to pattern, Decreased stride length, Trunk flexed, Wide base of support Gait velocity: decreased Gait velocity interpretation: <1.8 ft/sec, indicate of risk for recurrent falls Pre-gait activities: heel raises 5x standing with UE support, toe raises 3x, bil bicep curl 0# 10x General Gait Details: Pt was limited by fatigue. BP leveled out but did initially drop with MAP staying over 60      Balance Overall balance assessment: Needs assistance Sitting-balance support: Feet supported Sitting balance-Leahy Scale: Good     Standing balance support: Bilateral upper extremity supported Standing balance-Leahy Scale: Fair Standing balance comment: heavy reliance on RW, fair static standing but poor dynamic standing during standing hip flexion exercise, needing up to minA      Cognition Arousal: Alert Behavior During Therapy: Flat affect Overall Cognitive Status: Within Functional Limits for tasks assessed     Following Commands: Follows one step commands consistently       General Comments: Slow processing, esp while standing, pt often takes a long time to answer direct questions and at times needs repetition of  question, per spouse he is not HoH.           General Comments General comments (skin  integrity, edema, etc.): BLE edema, measured for TED hose today due to orthostatics yesteray. Jaundiced skin tone. orthostatics supine at 43 degrees HO 96/67, sitting 125/75, standing 87/61, 3 min standing: 82/63, After ambulation 79/62      Pertinent Vitals/Pain Pain Assessment Pain Assessment: Faces Faces Pain Scale: Hurts a little bit Pain Location: abdomen Pain Descriptors / Indicators: Guarding Pain Intervention(s): Monitored during session     PT Goals (current goals can now be found in the care plan section) Acute Rehab PT Goals Patient Stated Goal: to work on balance PT Goal Formulation: With patient/family Time For Goal Achievement: 02/15/23 Potential to Achieve Goals: Good Progress towards PT goals: Progressing toward goals    Frequency    Min 1X/week      PT Plan  Continue with current POC        AM-PAC PT "6 Clicks" Mobility   Outcome Measure  Help needed turning from your back to your side while in a flat bed without using bedrails?: A Little Help needed moving from lying on your back to sitting on the side of a flat bed without using bedrails?: A Little Help needed moving to and from a bed to a chair (including a wheelchair)?: A Little Help needed standing up from a chair using your arms (e.g., wheelchair or bedside chair)?: A Little Help needed to walk in hospital room?: A Little Help needed climbing 3-5 steps with a railing? : A Lot 6 Click Score: 17    End of Session   Activity Tolerance: Patient tolerated treatment well;Patient limited by fatigue Patient left: in bed;with call bell/phone within reach;with family/visitor present Nurse Communication: Mobility status PT Visit Diagnosis: Other abnormalities of gait and mobility (R26.89);Muscle weakness (generalized) (M62.81)     Time: 7253-6644 PT Time Calculation (min) (ACUTE ONLY): 30 min  Charges:    $Therapeutic Activity: 23-37 mins PT General Charges $$ ACUTE PT VISIT: 1 Visit                     Harrel Carina, DPT, CLT  Acute Rehabilitation Services Office: [phone] (Secure chat preferred)    Claudia Desanctis 02/14/2023, 9:40 AM

## 2023-02-15 DIAGNOSIS — K831 Obstruction of bile duct: Secondary | ICD-10-CM | POA: Diagnosis not present

## 2023-02-15 LAB — COMPREHENSIVE METABOLIC PANEL
ALT: 93 U/L — ABNORMAL HIGH (ref 0–44)
AST: 114 U/L — ABNORMAL HIGH (ref 15–41)
Albumin: 1.8 g/dL — ABNORMAL LOW (ref 3.5–5.0)
Alkaline Phosphatase: 277 U/L — ABNORMAL HIGH (ref 38–126)
Anion gap: 8 (ref 5–15)
BUN: 47 mg/dL — ABNORMAL HIGH (ref 8–23)
CO2: 23 mmol/L (ref 22–32)
Calcium: 8.5 mg/dL — ABNORMAL LOW (ref 8.9–10.3)
Chloride: 97 mmol/L — ABNORMAL LOW (ref 98–111)
Creatinine, Ser: 1.19 mg/dL (ref 0.61–1.24)
GFR, Estimated: >60 mL/min (ref >60)
Glucose, Bld: 109 mg/dL — ABNORMAL HIGH (ref 70–99)
Potassium: 4.8 mmol/L (ref 3.5–5.1)
Sodium: 128 mmol/L — ABNORMAL LOW (ref 135–145)
Total Bilirubin: 10.1 mg/dL — ABNORMAL HIGH (ref <1.2)
Total Protein: 5.1 g/dL — ABNORMAL LOW (ref 6.5–8.1)

## 2023-02-15 MED ORDER — TRAZODONE HCL 50 MG PO TABS
50.0000 mg | ORAL_TABLET | Freq: Every day | ORAL | Status: DC
  Administered 2023-02-15 – 2023-02-23 (×9): 50 mg via ORAL
  Filled 2023-02-15 (×9): qty 1

## 2023-02-15 NOTE — Progress Notes (Signed)
Mobility Specialist Progress Note:    02/15/23 1006  Mobility  Activity Ambulated with assistance in hallway  Level of Assistance Contact guard assist, steadying assist  Assistive Device Front wheel walker  Distance Ambulated (ft) 80 ft  Activity Response Tolerated well  Mobility Referral Yes  Mobility visit 1 Mobility  Mobility Specialist Start Time (ACUTE ONLY) 0913  Mobility Specialist Stop Time (ACUTE ONLY) 0930  Mobility Specialist Time Calculation (min) (ACUTE ONLY) 17 min   Pt received in BR, agreeable to mobility. Asymptomatic w/ no complaints throughout ambulation. Upon returning to room, pt able to stand at sink and brush teeth. Pt left in bed with call bell and all needs met.  Samuel Mayo Mobility Specialist Please contact via Special educational needs teacher or Rehab office at [phone]

## 2023-02-15 NOTE — Progress Notes (Addendum)
0730 patient alert x4 delayed responses up in chair with breakfast 0800 patient back in bed min assist with walker. 0900 mobility team helped patient ambulate to bathroom then in hall 1100 wife at bedside with toe anil clippers patient on blood thinners advised that we can not do that she insist she would patient agreeable education given to wife and patient that it was a risk with him being on blood thinners. 1200 patient wanting staff to pull him up in bed patient ambulates able to move self wife and patient wanting staff to lift patient unable to educate that patient needs to move if he's able to

## 2023-02-15 NOTE — Progress Notes (Signed)
PROGRESS NOTE Sasuke Rhyan Poblete  WGN:[id number] DOB: October 25, 1945 DOA: 01/24/2023 PCP: [name], [name], MD  Brief Narrative/Hospital Course: [id number] w/ Hx A-fib on Eliquis, HTN, seizure disorder, MVR, recent acute cholecystitis with abscess s/p cholecystostomy drain on 10/31 , exchanged 11/22 got admitted with obstructive jaundice on 11/22 with persistent occlusion of the cystic duct without opacification of the CBD. Seen by GI s/ p ERCP but unsuccessful and IR did PTC with biliary drain placement on 11/29.  On 12/1 became febrile hypotensive tachycardic-CCM and palliative care consulted. Left sided drain placement by IR on 12/6.  12/6 growing Pseudomonas aeruginosa and blood culture CA 19-9 elevated AFP normal oncology consulted.  Seen by ID advised Levaquin oral on discharge x 10 days  and outpatient follow-up 12/11: Currently with 3 drains: 1 cholecystostomy and 2 biliary drains-flushing once daily.    Subjective:  Patient seen examined this morning  Wife at the bedside son also arrived  Did not sleep well last night  Able to ambulate with PT this morning  Afebrile, labs show TB improving  Assessment and Plan: Principal Problem:   Obstructive jaundice Active Problems:   Atrial fibrillation, chronic (HCC)   Hypertension   Seizure (HCC)   Cholecystitis   Acute biliary pancreatitis   Malnutrition of moderate degree   Hyperbilirubinemia   DNR (do not resuscitate)   DNR (do not resuscitate) discussion   Palliative care by specialist   Weakness generalized   Obstructive jaundice Adenocarcinoma  of bile duct:A 19-9 elevated 939,AFP normal: ERCP unsuccessful IR placed drains currently has 1 cholecystostomy tube and 2 biliary drains, flushing daily-will need follow-up with drain clinic with IR-?  If needs to upsize drain IR to eval. 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 Recent Labs  Lab 02/09/23 0207 02/10/23 0203 02/11/23 0207 02/12/23 0217 02/13/23 1122 02/14/23 0201 02/15/23 0203  AST 71*   < > 74* 89* 109* 120* 114*  ALT 101*   < > 81* 93* 102* 104* 93*  ALKPHOS 217*   < > 187* 242* 293* 310* 277*  BILITOT 15.6*   < > 12.5* 12.9* 12.2* 11.5* 10.1*  PROT 5.2*   < > 4.7* 5.6* 5.8* 5.7* 5.1*  ALBUMIN 1.7*   < > <1.5* 1.7* 1.8* 1.8* 1.8*  PLT 274  --  328  --   --  507*  --    < > = values in this interval not displayed.   Sepsis secondary to cholangitis-febrile on 11/13 with leukocytosis Pseudomonas bacteremia Recent acute cholecystitis with abscess s/p cholecystostomy drain on 10/31 , exchanged 11/22: ID advised Levaquin oral on discharge x 10 days  and outpatient follow-up.  Currently afebrile, no leukocytosis. Wife concerned about ongoing bacteremia from 12/6, repeat blood cultures 02/03/23 NGTD Continue antibiotics.   Macrocytic anemia: Transfuse as needed.  Follow-up with oncology trend labs here. Recent Labs  Lab 02/09/23 0207 02/11/23 0207 02/12/23 0217 02/14/23 0201  HGB 8.9* 8.1* 9.1* 9.9*  HCT 26.8* 23.8* 27.1* 29.7*    Hyponatremia Metabolic acidosis Dehydration AKI-resolved: Electrolytes stable monitor intermittently.  Encourage oral intake hyponatremia history of  malignancy Suspect.  Hypertension Hypotension Orthostatic hypotension: BP currently stable PTA on  amlodipine, Toprol and on hold  Continue midodrine,compression stocking for mobility  Paroxysmal A-fib: Rate controlled continue Eliquis. holding Toprol  as BP had been soft   Seizure disorder:  Stable continue Depakote. Elevated liver  enzymes is not from Depakote but from obstructive liver disease.   History of mitral valve regurgitation:  S/p annuloplasty.  Follow-up with cardiology as an outpatient.   Goals of care: Palliative care, critical care was involved in the care. Currently patient is DNR, overall prognosis poor. Might be a candidate for hospice/comfort care if further declines.     Deconditioning/debility:  Continue PT OT plan for skilled nursing facility at this time  Insomnia: Added trazodone at bedtime  Severe malnutrition : Augment diet as below  Etiology: acute illness,  Cont  Remeron 7.5 mg daily  DVT prophylaxis: SCDs Start: 01/24/23 1337 Code Status:   Code Status: Limited: Do not attempt resuscitation (DNR) -DNR-LIMITED -Do Not Intubate/DNI  Family Communication: plan of care discussed with patient/wife and son at bedside. Patient status is: Remains hospitalized because of severity of illness Level of care: Progressive   Dispo: The patient is from: home            Anticipated disposition: snf once bacteremia better and once okay with hem onc  Objective: Vitals last 24 hrs: Vitals:   02/14/23 2303 02/15/23 0249 02/15/23 0756 02/15/23 0800  BP: 103/69 101/75 111/84   Pulse: 82 80 86 86  Resp: 18 20 18  (!) 21  Temp: 97.7 F (36.5 C) 97.7 F (36.5 C) 98.1 F (36.7 C)   TempSrc: Oral Oral Oral   SpO2: 99% 97% 98% 98%  Weight:      Height:       Weight change:   Physical Examination: General exam: alert awake, oriented at baseline, older than stated age HEENT:Oral mucosa moist, Ear/Nose WNL grossly Respiratory system: Bilaterally clear BS,no use of accessory muscle Cardiovascular system: S1 & S2 +, No JVD. Gastrointestinal system: Abdomen soft,NT,ND, multiple biliary drain present x 3 Nervous System: Alert, awake, moving all extremities,and following commands. Extremities: LE edema neg,distal peripheral pulses palpable and warm.  Skin: No rashes,Icterus. ++ MSK: Normal muscle  bulk,tone, power   Medications reviewed:  Scheduled Meds:  (feeding supplement) PROSource Plus  30 mL Oral BID BM   sodium chloride   Intravenous Once   apixaban  5 mg Oral BID   divalproex  750 mg Oral BID   feeding supplement  237 mL Oral QID   metroNIDAZOLE  500 mg Oral Q12H   midodrine  10 mg Oral TID WC   multivitamin with minerals  1 tablet Oral Daily   sodium chloride flush  10 mL Intracatheter Q8H   sodium chloride flush  3 mL Intravenous Q12H   sodium chloride flush  5 mL Intracatheter Q8H   sodium chloride flush  5 mL Intracatheter Q8H   traZODone  50 mg Oral QHS   Continuous Infusions:  levofloxacin (LEVAQUIN) IV 750 mg (02/15/23 1100)    Diet Order             Diet regular Room service appropriate? Yes; Fluid consistency: Thin  Diet effective now                  Intake/Output Summary (Last 24 hours) at 02/15/2023 1114 Last data filed at 02/15/2023 1100 Gross per 24 hour  Intake 510 ml  Output 1830 ml  Net -  1320 ml   Net IO Since Admission: -18,578.67 mL [02/15/23 1114]  Wt Readings from Last 3 Encounters:  02/11/23 83.5 kg  01/13/23 85.8 kg  01/03/23 91.3 kg     Unresulted Labs (From admission, onward)     Start     Ordered   02/14/23 0500  Comprehensive metabolic panel  Daily,   R     Question:  Specimen collection method  Answer:  Lab=Lab collect   02/13/23 0717          Data Reviewed: I have personally reviewed following labs and imaging studies CBC: Recent Labs  Lab 02/09/23 0207 02/11/23 0207 02/12/23 0217 02/14/23 0201  WBC 11.0* 8.9  --  12.1*  HGB 8.9* 8.1* 9.1* 9.9*  HCT 26.8* 23.8* 27.1* 29.7*  MCV 106.8* 103.5*  --  104.9*  PLT 274 328  --  507*   Basic Metabolic Panel:  Recent Labs  Lab 02/11/23 0207 02/12/23 0217 02/13/23 1122 02/14/23 0201 02/15/23 0203  NA 130* 128* 129* 132* 128*  K 3.6 4.0 4.0 4.3 4.8  CL 96* 97* 96* 98 97*  CO2 22 21* 21* 23 23  GLUCOSE 86 104* 116* 107* 109*  BUN 35* 36* 42* 39* 47*   CREATININE 0.99 1.12 1.18 1.11 1.19  CALCIUM 8.1* 8.6* 8.9 8.9 8.5*  MG 2.2  --   --   --   --   PHOS 4.3  --   --   --   --    GFR: Estimated Creatinine Clearance: 54.5 mL/min (by C-G formula based on SCr of 1.19 mg/dL). Liver Function Tests:  Recent Labs  Lab 02/11/23 0207 02/12/23 0217 02/13/23 1122 02/14/23 0201 02/15/23 0203  AST 74* 89* 109* 120* 114*  ALT 81* 93* 102* 104* 93*  ALKPHOS 187* 242* 293* 310* 277*  BILITOT 12.5* 12.9* 12.2* 11.5* 10.1*  PROT 4.7* 5.6* 5.8* 5.7* 5.1*  ALBUMIN <1.5* 1.7* 1.8* 1.8* 1.8*  Sepsis Labs: No results for input(s): "PROCALCITON", "LATICACIDVEN" in the last 168 hours. Recent Results (from the past 240 hours)  Culture, blood (Routine X 2) w Reflex to ID Panel     Status: Abnormal   Collection Time: 02/07/23  2:28 PM   Specimen: BLOOD  Result Value Ref Range Status   Specimen Description BLOOD SITE NOT SPECIFIED  Final   Special Requests   Final    BOTTLES DRAWN AEROBIC ONLY Blood Culture adequate volume   Culture  Setup Time   Final    GRAM NEGATIVE RODS AEROBIC BOTTLE ONLY CRITICAL RESULT CALLED TO, READ BACK BY AND VERIFIED WITH: Meribeth Mattes [id number] AT 1356 BY EC Performed at [facility], [address] [place], [place] [address]    Culture PSEUDOMONAS AERUGINOSA (A)  Final   Report Status 02/10/2023 FINAL  Final   Organism ID, Bacteria PSEUDOMONAS AERUGINOSA  Final      Susceptibility   Pseudomonas aeruginosa - MIC*    CEFTAZIDIME 16 INTERMEDIATE Intermediate     CIPROFLOXACIN <=0.25 SENSITIVE Sensitive     GENTAMICIN 2 SENSITIVE Sensitive     IMIPENEM 1 SENSITIVE Sensitive     CEFEPIME 16 RESISTANT Resistant     * PSEUDOMONAS AERUGINOSA  Blood Culture ID Panel (Reflexed)     Status: Abnormal   Collection Time: 02/07/23  2:28 PM  Result Value Ref Range Status   Enterococcus faecalis NOT DETECTED NOT DETECTED Final   Enterococcus Faecium NOT DETECTED NOT DETECTED Final   Listeria monocytogenes NOT  DETECTED NOT DETECTED Final   Staphylococcus species NOT DETECTED NOT DETECTED Final   Staphylococcus aureus (BCID) NOT DETECTED NOT DETECTED Final   Staphylococcus epidermidis NOT DETECTED NOT DETECTED Final   Staphylococcus lugdunensis NOT DETECTED NOT DETECTED Final   Streptococcus species NOT DETECTED NOT DETECTED Final   Streptococcus agalactiae NOT DETECTED NOT DETECTED Final   Streptococcus pneumoniae NOT DETECTED NOT DETECTED Final   Streptococcus pyogenes NOT DETECTED NOT DETECTED Final   A.calcoaceticus-baumannii NOT DETECTED NOT DETECTED Final   Bacteroides fragilis NOT DETECTED NOT DETECTED Final   Enterobacterales NOT DETECTED NOT DETECTED Final   Enterobacter cloacae complex NOT DETECTED NOT DETECTED Final   Escherichia coli NOT DETECTED NOT DETECTED Final   Klebsiella aerogenes NOT DETECTED NOT DETECTED Final   Klebsiella oxytoca NOT DETECTED NOT DETECTED Final   Klebsiella pneumoniae NOT DETECTED NOT DETECTED Final   Proteus species NOT DETECTED NOT DETECTED Final   Salmonella species NOT DETECTED NOT DETECTED Final   Serratia marcescens NOT DETECTED NOT DETECTED Final   Haemophilus influenzae NOT DETECTED NOT DETECTED Final   Neisseria meningitidis NOT DETECTED NOT DETECTED Final   Pseudomonas aeruginosa DETECTED (A) NOT DETECTED Final    Comment: CRITICAL RESULT CALLED TO, READ BACK BY AND VERIFIED WITH: PHARMD NATHAN GOAD [phone] AT 1356 BY EC    Stenotrophomonas maltophilia NOT DETECTED NOT DETECTED Final   Candida albicans NOT DETECTED NOT DETECTED Final   Candida auris NOT DETECTED NOT DETECTED Final   Candida glabrata NOT DETECTED NOT DETECTED Final   Candida krusei NOT DETECTED NOT DETECTED Final   Candida parapsilosis NOT DETECTED NOT DETECTED Final   Candida tropicalis NOT DETECTED NOT DETECTED Final   Cryptococcus neoformans/gattii NOT DETECTED NOT DETECTED Final   CTX-M ESBL NOT DETECTED NOT DETECTED Final   Carbapenem resistance IMP NOT DETECTED NOT  DETECTED Final   Carbapenem resistance KPC NOT DETECTED NOT DETECTED Final   Carbapenem resistance NDM NOT DETECTED NOT DETECTED Final   Carbapenem resistance VIM NOT DETECTED NOT DETECTED Final    Comment: Performed at [facility], [address], [place], [place] [address]  Culture, blood (Routine X 2) w Reflex to ID Panel     Status: Abnormal   Collection Time: 02/07/23  2:30 PM   Specimen: BLOOD  Result Value Ref Range Status   Specimen Description BLOOD SITE NOT SPECIFIED  Final   Special Requests   Final    BOTTLES DRAWN AEROBIC ONLY Blood Culture adequate volume   Culture  Setup Time GRAM NEGATIVE RODS AEROBIC BOTTLE ONLY   Final   Culture (A)  Final    PSEUDOMONAS AERUGINOSA SUSCEPTIBILITIES PERFORMED ON PREVIOUS CULTURE WITHIN THE LAST 5 DAYS. Performed at [facility], [address], [place], [place] [address]    Report Status 02/10/2023 FINAL  Final  Culture, blood (Routine X 2) w Reflex to ID Panel     Status: None (Preliminary result)   Collection Time: 02/13/23 11:22 AM   Specimen: BLOOD LEFT HAND  Result Value Ref Range Status   Specimen Description BLOOD LEFT HAND  Final   Special Requests   Final    BOTTLES DRAWN AEROBIC AND ANAEROBIC Blood Culture results may not be optimal due to an inadequate volume of blood received in culture bottles   Culture   Final    NO GROWTH < 24 HOURS Performed at [facility], [address], [place], [place] [address]    Report Status PENDING  Incomplete  Culture, blood (Routine X 2) w Reflex to ID Panel     Status: None (Preliminary result)   Collection Time: 02/13/23 11:22 AM   Specimen: BLOOD RIGHT HAND  Result Value Ref Range Status   Specimen Description BLOOD RIGHT HAND  Final   Special Requests   Final    BOTTLES DRAWN AEROBIC AND ANAEROBIC Blood Culture results may not be optimal due to an inadequate volume of blood received in culture bottles   Culture   Final    NO GROWTH < 24 HOURS Performed at  [facility], [address], [place], [place] [address]    Report Status PENDING  Incomplete  MRSA Next Gen by PCR, Nasal     Status: None   Collection Time: 02/13/23  7:56 PM   Specimen: Nasal Mucosa; Nasal Swab  Result Value Ref Range Status   MRSA by PCR Next Gen NOT DETECTED NOT DETECTED Final    Comment: (NOTE) The GeneXpert MRSA Assay (FDA approved for NASAL specimens only), is one component of a comprehensive MRSA colonization surveillance program. It is not intended to diagnose MRSA infection nor to guide or monitor treatment for MRSA infections. Test performance is not FDA approved in patients less than 31 years old. Performed at [facility], [address] [place], [place] [address]     Antimicrobials/Microbiology: Anti-infectives (From admission, onward)    Start     Dose/Rate Route Frequency Ordered Stop   02/13/23 1640  cefTRIAXone (ROCEPHIN) 1 g in sodium chloride 0.9 % 100 mL IVPB        over 30 Minutes  Continuous PRN 02/13/23 1654 02/13/23 1640   02/10/23 1030  levofloxacin (LEVAQUIN) IVPB 750 mg        750 mg 100 mL/hr over 90 Minutes Intravenous Every 24 hours 02/10/23 0931 02/20/23 0959   02/10/23 1030  metroNIDAZOLE (FLAGYL) tablet 500 mg        500 mg Oral Every 12 hours 02/10/23 0931 02/20/23 0959   02/07/23 1445  piperacillin-tazobactam (ZOSYN) IVPB 3.375 g  Status:  Discontinued        3.375 g 12.5 mL/hr over 240 Minutes Intravenous Every 8 hours 02/07/23 1347 02/10/23 0931   02/07/23 1002  cefTRIAXone (ROCEPHIN) 2 g in sodium chloride 0.9 % 100 mL IVPB        over 30 Minutes Intravenous Continuous PRN 02/07/23 1011 02/07/23 1017   02/07/23 0945  cefTRIAXone (ROCEPHIN) 2 g in sodium chloride 0.9 % 100 mL IVPB        2 g 200 mL/hr over 30 Minutes Intravenous To Radiology 02/07/23 0845 02/08/23 0945   02/06/23 1000  amoxicillin-clavulanate (AUGMENTIN) 875-125 MG per tablet 1 tablet  Status:  Discontinued        1 tablet Oral Every 12 hours  02/05/23 1108 02/07/23 1347   02/05/23 1000  Vancomycin (VANCOCIN) 1,500 mg in sodium chloride 0.9 % 500 mL IVPB        1,500 mg 250 mL/hr over 120 Minutes Intravenous Every 24 hours 02/05/23 0903 02/05/23 1229   02/04/23 1000  vancomycin (VANCOCIN) IVPB 1000 mg/200 mL premix  Status:  Discontinued        1,000 mg 200 mL/hr over 60 Minutes Intravenous Every 24 hours 02/03/23 1122 02/05/23 0903   02/03/23 1000  Vancomycin (VANCOCIN) 1,500 mg in sodium chloride 0.9 % 500 mL IVPB  Status:  Discontinued        1,500 mg 250 mL/hr over 120 Minutes Intravenous  Every 24 hours 02/02/23 1006 02/03/23 1122   02/02/23 2100  vancomycin (VANCOCIN) IVPB 1000 mg/200 mL premix  Status:  Discontinued        1,000 mg 200 mL/hr over 60 Minutes Intravenous Every 12 hours 02/02/23 0811 02/02/23 1006   02/02/23 1100  vancomycin (VANCOCIN) IVPB 1000 mg/200 mL premix       Placed in "Followed by" Linked Group   1,000 mg 200 mL/hr over 60 Minutes Intravenous  Once 02/02/23 0913 02/02/23 1500   02/02/23 1000  vancomycin (VANCOCIN) IVPB 1000 mg/200 mL premix       Placed in "Followed by" Linked Group   1,000 mg 200 mL/hr over 60 Minutes Intravenous  Once 02/02/23 0913 02/02/23 1340   02/02/23 0900  vancomycin (VANCOREADY) IVPB 2000 mg/400 mL  Status:  Discontinued        2,000 mg 200 mL/hr over 120 Minutes Intravenous  Once 02/02/23 0804 02/02/23 0913   02/02/23 0900  piperacillin-tazobactam (ZOSYN) IVPB 3.375 g        3.375 g 12.5 mL/hr over 240 Minutes Intravenous Every 8 hours 02/02/23 0805 02/05/23 2359   02/01/23 2200  amoxicillin-clavulanate (AUGMENTIN) 875-125 MG per tablet 1 tablet  Status:  Discontinued        1 tablet Oral Every 12 hours 02/01/23 1150 02/02/23 0738   01/31/23 1058  cefOXitin (MEFOXIN) 2 g in sodium chloride 0.9 % 100 mL IVPB        over 30 Minutes  Continuous PRN 01/31/23 1058 01/31/23 1058   01/31/23 0000  cefOXitin (MEFOXIN) 2 g in sodium chloride 0.9 % 100 mL IVPB  Status:   Discontinued        2 g 200 mL/hr over 30 Minutes Intravenous To Radiology 01/30/23 1014 01/30/23 1015   01/25/23 1600  ampicillin-sulbactam (UNASYN) 1.5 g in sodium chloride 0.9 % 100 mL IVPB  Status:  Discontinued        1.5 g 200 mL/hr over 30 Minutes Intravenous Every 6 hours 01/25/23 1525 02/01/23 1150         Component Value Date/Time   SDES BLOOD LEFT HAND 02/13/2023 1122   SDES BLOOD RIGHT HAND 02/13/2023 1122   SPECREQUEST  02/13/2023 1122    BOTTLES DRAWN AEROBIC AND ANAEROBIC Blood Culture results may not be optimal due to an inadequate volume of blood received in culture bottles   SPECREQUEST  02/13/2023 1122    BOTTLES DRAWN AEROBIC AND ANAEROBIC Blood Culture results may not be optimal due to an inadequate volume of blood received in culture bottles   CULT  02/13/2023 1122    NO GROWTH < 24 HOURS Performed at [facility], [address], [place], [place] [address]    CULT  02/13/2023 1122    NO GROWTH < 24 HOURS Performed at [facility], [address] [place], [place] [address]    REPTSTATUS PENDING 02/13/2023 1122   REPTSTATUS PENDING 02/13/2023 1122    Radiology Studies: IR EXCHANGE BILIARY DRAIN Result Date: 02/14/2023 INDICATION: Patient initially diagnosed with Mirizzi syndrome, post cholecystostomy tube placement on 01/02/2023. Unfortunately, patient experienced worsening hyperbilirubinemia, ultimately undergoing attempted though unsuccessful ERCP. As such, patient underwent placement of a right-sided biliary drainage catheter on the 01/31/2023 and ultimately placement of a left-sided biliary drainage catheter on 02/07/2023. 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 Since that time, despite cholecystostomy and bilateral biliary drainage catheter placement, the patient's hyperbilirubinemia has persisted. As such, the patient presents today  for cholangiogram and potential biliary drainage catheter exchange and up  sizing. EXAM: 1. Cholangiogram via existing cholecystostomy tube. CHOLANGIOGRAM VIA EXISTING CHOLECYSTOSTOMY TUBE 2. FLUOROSCOPIC GUIDED EXCHANGE AND UP SIZING TO NOW 14 FRENCH RIGHT-SIDED BILIARY DRAINAGE CATHETER 3. FLUOROSCOPIC GUIDED EXCHANGE AND UP SIZING TO NOW 12 FRENCH LEFT-SIDED BILIARY DRAINAGE CATHETER COMPARISON:  COMPARISON Image guided left-sided biliary drainage catheter placement and brush biopsy - 02/07/2023 Image guided right-sided biliary drainage catheter placement - 01/31/2023 Cholecystostomy tube exchange - 01/24/2023 Image guided cholecystostomy tube placement - 01/02/2023 CT abdomen and pelvis - 02/06/2023; 02/02/2023 Abdominal MRI - 12/29/2022 CONTRAST:  25 mL Omnipaque-300 administered into the collecting system ANESTHESIA/SEDATION: ANESTHESIA/SEDATION 3 minutes, 6 seconds (117 mGy) FLUOROSCOPY TIME:  3 minutes, 6 seconds (1 image 17 mGy) COMPLICATIONS: None immediate. TECHNIQUE: Informed written consent was obtained from the patient after a discussion of the risks, benefits and alternatives to treatment. Questions regarding the procedure were encouraged and answered. A timeout was performed prior to the initiation of the procedure. Preprocedural spot fluoroscopic image was obtained of the right upper abdominal quadrant. Cholangiogram were performed via the right and left biliary drainage catheters as well as the cholecystostomy tube. Next, the external portion of the left lower drainage catheter was cut and cannulated with an Amplatz wire which was advanced through the biliary drainage catheter to the level of the duodenum. Under fluoroscopic guided exchange, the pre-existing 10 [place] left-sided biliary drainage catheter was exchanged for a new, slightly larger, now 8 [place] biliary drainage catheter with end coiled and locked within the duodenum. The identical procedure was performed for the right-sided biliary drainage catheter, ultimately with exchange of the pre-existing 12 [place]  biliary drainage catheter for a new, slightly larger now 12 [place] biliary drainage catheter with end coiled and locked within the duodenum. Small amount of contrast was injected via the exchanged biliary drainage catheters and post fluoroscopic and radiographic images were obtained. The biliary drainage catheters were and secured to the skin with interrupted sutures and all drainage catheters were reconnected to gravity bags. Dressings were applied. The patient tolerated the procedure well without immediate postprocedural complication. FINDINGS: Appropriately positioned and functioning cholecystostomy tube. There is persistent occlusion of the cystic duct at its mid/peripheral aspects. Appropriate positioning and functionality of both the right and left-sided biliary drainage catheters with opacification of the intrahepatic biliary system as well as passage of contrast through the catheters to the level of the duodenum. After fluoroscopic guided exchange and up sizing, both biliary drainage catheters are appropriately positioned with end coiled and locked within the duodenum. IMPRESSION: 1. Successful fluoroscopic guided exchange and up sizing of now 51 French right-sided biliary drainage catheter with end coiled and locked within the duodenum. 2. Successful fluoroscopic guided exchange and up sizing of now 54 French left-sided biliary drainage catheter with end coiled and locked within the duodenum. 3. Appropriately positioned and functioning cholecystostomy tube with persistent complete occlusion of the mid/peripheral aspect of the cystic duct. The cholecystostomy tube was not exchanged as it was most recently exchanged on 01/24/2023. PLAN: Recommend obtaining daily CMP. If patient's hyperbilirubinemia immediate does not improve in the coming days, would recommend further evaluation with repeat either contrast-enhanced abdominal CT or MRCP as indicated. Electronically Signed   By: Simonne Come M.D.   On:  02/14/2023 07:15   IR EXCHANGE BILIARY DRAIN Result Date: 02/14/2023 INDICATION: Patient initially diagnosed with Mirizzi syndrome, post cholecystostomy tube placement on 01/02/2023. Unfortunately, patient experienced worsening hyperbilirubinemia, ultimately undergoing attempted though unsuccessful ERCP. As  such, patient underwent placement of a right-sided biliary drainage catheter on the 01/31/2023 and ultimately placement of a left-sided biliary drainage catheter on 02/07/2023. 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 Since that time, despite cholecystostomy and bilateral biliary drainage catheter placement, the patient's hyperbilirubinemia has persisted. As such, the patient presents today for cholangiogram and potential biliary drainage catheter exchange and up sizing. EXAM: 1. Cholangiogram via existing cholecystostomy tube. CHOLANGIOGRAM VIA EXISTING CHOLECYSTOSTOMY TUBE 2. FLUOROSCOPIC GUIDED EXCHANGE AND UP SIZING TO NOW 14 FRENCH RIGHT-SIDED BILIARY DRAINAGE CATHETER 3. FLUOROSCOPIC GUIDED EXCHANGE AND UP SIZING TO NOW 12 FRENCH LEFT-SIDED BILIARY DRAINAGE CATHETER COMPARISON:  COMPARISON Image guided left-sided biliary drainage catheter placement and brush biopsy - 02/07/2023 Image guided right-sided biliary drainage catheter placement - 01/31/2023 Cholecystostomy tube exchange - 01/24/2023 Image guided cholecystostomy tube placement - 01/02/2023 CT abdomen and pelvis - 02/06/2023; 02/02/2023 Abdominal MRI - 12/29/2022 CONTRAST:  25 mL Omnipaque-300 administered into the collecting system ANESTHESIA/SEDATION: ANESTHESIA/SEDATION 3 minutes, 6 seconds (117 mGy) FLUOROSCOPY TIME:  3 minutes, 6 seconds (1 image 17 mGy) COMPLICATIONS: None immediate. TECHNIQUE: Informed written consent was obtained from the patient after a discussion of the risks, benefits and alternatives to treatment. Questions regarding the procedure were encouraged and answered. A timeout was  performed prior to the initiation of the procedure. Preprocedural spot fluoroscopic image was obtained of the right upper abdominal quadrant. Cholangiogram were performed via the right and left biliary drainage catheters as well as the cholecystostomy tube. Next, the external portion of the left lower drainage catheter was cut and cannulated with an Amplatz wire which was advanced through the biliary drainage catheter to the level of the duodenum. Under fluoroscopic guided exchange, the pre-existing 10 [place] left-sided biliary drainage catheter was exchanged for a new, slightly larger, now 33 [place] biliary drainage catheter with end coiled and locked within the duodenum. The identical procedure was performed for the right-sided biliary drainage catheter, ultimately with exchange of the pre-existing 12 [place] biliary drainage catheter for a new, slightly larger now 5 [place] biliary drainage catheter with end coiled and locked within the duodenum. Small amount of contrast was injected via the exchanged biliary drainage catheters and post fluoroscopic and radiographic images were obtained. The biliary drainage catheters were and secured to the skin with interrupted sutures and all drainage catheters were reconnected to gravity bags. Dressings were applied. The patient tolerated the procedure well without immediate postprocedural complication. FINDINGS: Appropriately positioned and functioning cholecystostomy tube. There is persistent occlusion of the cystic duct at its mid/peripheral aspects. Appropriate positioning and functionality of both the right and left-sided biliary drainage catheters with opacification of the intrahepatic biliary system as well as passage of contrast through the catheters to the level of the duodenum. After fluoroscopic guided exchange and up sizing, both biliary drainage catheters are appropriately positioned with end coiled and locked within the duodenum. IMPRESSION: 1. Successful  fluoroscopic guided exchange and up sizing of now 19 French right-sided biliary drainage catheter with end coiled and locked within the duodenum. 2. Successful fluoroscopic guided exchange and up sizing of now 8 French left-sided biliary drainage catheter with end coiled and locked within the duodenum. 3. Appropriately positioned and functioning cholecystostomy tube with persistent complete occlusion of the mid/peripheral aspect of the cystic duct. The cholecystostomy tube was not exchanged as it was most recently exchanged on 01/24/2023. PLAN: Recommend obtaining daily CMP. If patient's hyperbilirubinemia immediate does not improve in the coming days, would recommend further evaluation with repeat  either contrast-enhanced abdominal CT or MRCP as indicated. Electronically Signed   By: Simonne Come M.D.   On: 02/14/2023 07:15   IR CHOLANGIOGRAM EXISTING TUBE Result Date: 02/14/2023 INDICATION: Patient initially diagnosed with Mirizzi syndrome, post cholecystostomy tube placement on 01/02/2023. Unfortunately, patient experienced worsening hyperbilirubinemia, ultimately undergoing attempted though unsuccessful ERCP. As such, patient underwent placement of a right-sided biliary drainage catheter on the 01/31/2023 and ultimately placement of a left-sided biliary drainage catheter on 02/07/2023. 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 Since that time, despite cholecystostomy and bilateral biliary drainage catheter placement, the patient's hyperbilirubinemia has persisted. As such, the patient presents today for cholangiogram and potential biliary drainage catheter exchange and up sizing. EXAM: 1. Cholangiogram via existing cholecystostomy tube. CHOLANGIOGRAM VIA EXISTING CHOLECYSTOSTOMY TUBE 2. FLUOROSCOPIC GUIDED EXCHANGE AND UP SIZING TO NOW 14 FRENCH RIGHT-SIDED BILIARY DRAINAGE CATHETER 3. FLUOROSCOPIC GUIDED EXCHANGE AND UP SIZING TO NOW 12 FRENCH LEFT-SIDED BILIARY  DRAINAGE CATHETER COMPARISON:  COMPARISON Image guided left-sided biliary drainage catheter placement and brush biopsy - 02/07/2023 Image guided right-sided biliary drainage catheter placement - 01/31/2023 Cholecystostomy tube exchange - 01/24/2023 Image guided cholecystostomy tube placement - 01/02/2023 CT abdomen and pelvis - 02/06/2023; 02/02/2023 Abdominal MRI - 12/29/2022 CONTRAST:  25 mL Omnipaque-300 administered into the collecting system ANESTHESIA/SEDATION: ANESTHESIA/SEDATION 3 minutes, 6 seconds (117 mGy) FLUOROSCOPY TIME:  3 minutes, 6 seconds (1 image 17 mGy) COMPLICATIONS: None immediate. TECHNIQUE: Informed written consent was obtained from the patient after a discussion of the risks, benefits and alternatives to treatment. Questions regarding the procedure were encouraged and answered. A timeout was performed prior to the initiation of the procedure. Preprocedural spot fluoroscopic image was obtained of the right upper abdominal quadrant. Cholangiogram were performed via the right and left biliary drainage catheters as well as the cholecystostomy tube. Next, the external portion of the left lower drainage catheter was cut and cannulated with an Amplatz wire which was advanced through the biliary drainage catheter to the level of the duodenum. Under fluoroscopic guided exchange, the pre-existing 10 [place] left-sided biliary drainage catheter was exchanged for a new, slightly larger, now 32 [place] biliary drainage catheter with end coiled and locked within the duodenum. The identical procedure was performed for the right-sided biliary drainage catheter, ultimately with exchange of the pre-existing 12 [place] biliary drainage catheter for a new, slightly larger now 74 [place] biliary drainage catheter with end coiled and locked within the duodenum. Small amount of contrast was injected via the exchanged biliary drainage catheters and post fluoroscopic and radiographic images were obtained. The biliary  drainage catheters were and secured to the skin with interrupted sutures and all drainage catheters were reconnected to gravity bags. Dressings were applied. The patient tolerated the procedure well without immediate postprocedural complication. FINDINGS: Appropriately positioned and functioning cholecystostomy tube. There is persistent occlusion of the cystic duct at its mid/peripheral aspects. Appropriate positioning and functionality of both the right and left-sided biliary drainage catheters with opacification of the intrahepatic biliary system as well as passage of contrast through the catheters to the level of the duodenum. After fluoroscopic guided exchange and up sizing, both biliary drainage catheters are appropriately positioned with end coiled and locked within the duodenum. IMPRESSION: 1. Successful fluoroscopic guided exchange and up sizing of now 64 French right-sided biliary drainage catheter with end coiled and locked within the duodenum. 2. Successful fluoroscopic guided exchange and up sizing of now 65 French left-sided biliary drainage catheter with end coiled and locked within the  duodenum. 3. Appropriately positioned and functioning cholecystostomy tube with persistent complete occlusion of the mid/peripheral aspect of the cystic duct. The cholecystostomy tube was not exchanged as it was most recently exchanged on 01/24/2023. PLAN: Recommend obtaining daily CMP. If patient's hyperbilirubinemia immediate does not improve in the coming days, would recommend further evaluation with repeat either contrast-enhanced abdominal CT or MRCP as indicated. Electronically Signed   By: Simonne Come M.D.   On: 02/14/2023 07:15   CT CHEST WO CONTRAST Result Date: 02/13/2023 CLINICAL DATA:  Hepatocellular carcinoma, cholangiocarcinoma, staging evaluation EXAM: CT CHEST WITHOUT CONTRAST TECHNIQUE: Multidetector CT imaging of the chest was performed following the standard protocol without IV contrast. RADIATION  DOSE REDUCTION: This exam was performed according to the departmental dose-optimization program which includes automated exposure control, adjustment of the mA and/or kV according to patient size and/or use of iterative reconstruction technique. COMPARISON:  02/06/2023 FINDINGS: Cardiovascular: Unenhanced imaging of the heart is unremarkable without pericardial effusion. Mitral valve prosthesis. 4.1 cm ascending thoracic aortic aneurysm. Evaluation of the vascular lumen is limited without IV contrast. Atherosclerosis of the aorta and coronary vasculature. Mediastinum/Nodes: No enlarged mediastinal or axillary lymph nodes. Thyroid gland, trachea, and esophagus demonstrate no significant findings. Lungs/Pleura: No acute airspace disease, effusion, or pneumothorax. Linear consolidation within the right lower lobe consistent with subsegmental atelectasis or scarring. Upper Abdomen: Percutaneous biliary drains are identified traversing the left and right lobes of the liver. Downstream extent excluded by slice selection. Percutaneous cholecystostomy tube is seen coiled within the gallbladder fossa. Minimal pneumobilia. Continued edema at the level of porta hepatis which may reflect known cholangiocarcinoma. Musculoskeletal: Large sclerotic lesions are seen within the C7 and T3 vertebral body suspicious for metastatic disease. There are chronic compression deformities of T4, T9, T11, and L1. Reconstructed images demonstrate no additional findings. IMPRESSION: 1. Large sclerotic lesions within the C7 and T3 vertebral bodies consistent with bony metastatic disease. 2. No other signs of intrathoracic metastases. 3. Stable findings within the liver consistent with known cholangiocarcinoma and indwelling biliary and gallbladder drains. Please refer to recent CT abdomen exam for full description of findings. 4. 4.1 cm ascending thoracic aortic aneurysm. Recommend annual imaging followup by CTA or MRA. This recommendation follows  2010 ACCF/AHA/AATS/ACR/ASA/SCA/SCAI/SIR/STS/SVM Guidelines for the Diagnosis and Management of Patients with Thoracic Aortic Disease. Circulation. 2010; 121: W098-J191. Aortic aneurysm NOS (ICD10-I71.9) 5. Aortic Atherosclerosis (ICD10-I70.0). Coronary artery atherosclerosis. Electronically Signed   By: Sharlet Salina M.D.   On: 02/13/2023 18:57     LOS: 20 days   Total time spent in review of labs and imaging, patient evaluation, formulation of plan, documentation and communication with family: 35 minutes  Lanae Boast, MD [facility]  02/15/2023, 11:14 AM

## 2023-02-15 NOTE — Plan of Care (Signed)
  Problem: Education: Goal: Knowledge of General Education information will improve Description: Including pain rating scale, medication(s)/side effects and non-pharmacologic comfort measures 02/15/2023 1338 by Aretta Nip, RN Outcome: Progressing 02/15/2023 1314 by Aretta Nip, RN Outcome: Progressing   Problem: Health Behavior/Discharge Planning: Goal: Ability to manage health-related needs will improve 02/15/2023 1338 by Aretta Nip, RN Outcome: Progressing 02/15/2023 1314 by Aretta Nip, RN Outcome: Progressing   Problem: Clinical Measurements: Goal: Ability to maintain clinical measurements within normal limits will improve 02/15/2023 1338 by Aretta Nip, RN Outcome: Progressing 02/15/2023 1314 by Aretta Nip, RN Outcome: Progressing Goal: Will remain free from infection 02/15/2023 1338 by Aretta Nip, RN Outcome: Progressing 02/15/2023 1314 by Aretta Nip, RN Outcome: Progressing Goal: Diagnostic test results will improve 02/15/2023 1338 by Aretta Nip, RN Outcome: Progressing 02/15/2023 1314 by Aretta Nip, RN Outcome: Progressing Goal: Respiratory complications will improve 02/15/2023 1338 by Aretta Nip, RN Outcome: Progressing 02/15/2023 1314 by Aretta Nip, RN Outcome: Progressing Goal: Cardiovascular complication will be avoided 02/15/2023 1338 by Aretta Nip, RN Outcome: Progressing 02/15/2023 1314 by Aretta Nip, RN Outcome: Progressing   Problem: Activity: Goal: Risk for activity intolerance will decrease 02/15/2023 1338 by Aretta Nip, RN Outcome: Progressing 02/15/2023 1314 by Aretta Nip, RN Outcome: Progressing   Problem: Nutrition: Goal: Adequate nutrition will be maintained 02/15/2023 1338 by Aretta Nip, RN Outcome: Progressing 02/15/2023 1314 by Aretta Nip, RN Outcome: Not Progressing   Problem: Coping: Goal: Level of anxiety will decrease 02/15/2023 1338 by  Aretta Nip, RN Outcome: Progressing 02/15/2023 1314 by Aretta Nip, RN Outcome: Progressing   Problem: Elimination: Goal: Will not experience complications related to bowel motility 02/15/2023 1338 by Aretta Nip, RN Outcome: Progressing 02/15/2023 1314 by Aretta Nip, RN Outcome: Progressing Goal: Will not experience complications related to urinary retention 02/15/2023 1338 by Aretta Nip, RN Outcome: Progressing 02/15/2023 1314 by Aretta Nip, RN Outcome: Progressing   Problem: Pain Management: Goal: General experience of comfort will improve 02/15/2023 1338 by Aretta Nip, RN Outcome: Progressing 02/15/2023 1314 by Aretta Nip, RN Outcome: Progressing   Problem: Safety: Goal: Ability to remain free from injury will improve 02/15/2023 1338 by Aretta Nip, RN Outcome: Progressing 02/15/2023 1314 by Aretta Nip, RN Outcome: Not Progressing   Problem: Skin Integrity: Goal: Risk for impaired skin integrity will decrease 02/15/2023 1338 by Aretta Nip, RN Outcome: Progressing 02/15/2023 1314 by Aretta Nip, RN Outcome: Progressing   Problem: Fluid Volume: Goal: Hemodynamic stability will improve 02/15/2023 1338 by Aretta Nip, RN Outcome: Progressing 02/15/2023 1314 by Aretta Nip, RN Outcome: Progressing   Problem: Clinical Measurements: Goal: Diagnostic test results will improve 02/15/2023 1338 by Aretta Nip, RN Outcome: Progressing 02/15/2023 1314 by Aretta Nip, RN Outcome: Progressing Goal: Signs and symptoms of infection will decrease 02/15/2023 1338 by Aretta Nip, RN Outcome: Progressing 02/15/2023 1314 by Aretta Nip, RN Outcome: Progressing   Problem: Respiratory: Goal: Ability to maintain adequate ventilation will improve 02/15/2023 1338 by Aretta Nip, RN Outcome: Progressing 02/15/2023 1314 by Aretta Nip, RN Outcome: Progressing

## 2023-02-15 NOTE — Plan of Care (Signed)
  Problem: Fluid Volume: Goal: Hemodynamic stability will improve 02/15/2023 1339 by Aretta Nip, RN Outcome: Progressing 02/15/2023 1338 by Aretta Nip, RN Outcome: Progressing 02/15/2023 1314 by Aretta Nip, RN Outcome: Progressing   Problem: Clinical Measurements: Goal: Diagnostic test results will improve 02/15/2023 1339 by Aretta Nip, RN Outcome: Progressing 02/15/2023 1338 by Aretta Nip, RN Outcome: Progressing 02/15/2023 1314 by Aretta Nip, RN Outcome: Progressing Goal: Signs and symptoms of infection will decrease 02/15/2023 1339 by Aretta Nip, RN Outcome: Progressing 02/15/2023 1338 by Aretta Nip, RN Outcome: Progressing 02/15/2023 1314 by Aretta Nip, RN Outcome: Progressing   Problem: Respiratory: Goal: Ability to maintain adequate ventilation will improve 02/15/2023 1339 by Aretta Nip, RN Outcome: Progressing 02/15/2023 1338 by Aretta Nip, RN Outcome: Progressing 02/15/2023 1314 by Aretta Nip, RN Outcome: Progressing

## 2023-02-15 NOTE — Progress Notes (Signed)
Mobility Specialist Progress Note:    02/15/23 1000  Mobility  Activity Ambulated with assistance to bathroom  Level of Assistance Contact guard assist, steadying assist  Assistive Device Front wheel walker  Distance Ambulated (ft) 15 ft  Activity Response Tolerated well  Mobility Referral Yes  Mobility visit 1 Mobility  Mobility Specialist Start Time (ACUTE ONLY) [id number]  Mobility Specialist Stop Time (ACUTE ONLY) 0859  Mobility Specialist Time Calculation (min) (ACUTE ONLY) 7 min   Pt received in bed, requesting assistance to BR. Asymptomatic w/ no complaints throughout. Pt left in BR with instructions to pull call string.  Samuel Mayo Mobility Specialist Please contact via Special educational needs teacher or Rehab office at [phone]

## 2023-02-16 DIAGNOSIS — K831 Obstruction of bile duct: Secondary | ICD-10-CM | POA: Diagnosis not present

## 2023-02-16 LAB — COMPREHENSIVE METABOLIC PANEL
ALT: 86 U/L — ABNORMAL HIGH (ref 0–44)
AST: 102 U/L — ABNORMAL HIGH (ref 15–41)
Albumin: 1.8 g/dL — ABNORMAL LOW (ref 3.5–5.0)
Alkaline Phosphatase: 265 U/L — ABNORMAL HIGH (ref 38–126)
Anion gap: 8 (ref 5–15)
BUN: 45 mg/dL — ABNORMAL HIGH (ref 8–23)
CO2: 25 mmol/L (ref 22–32)
Calcium: 8.8 mg/dL — ABNORMAL LOW (ref 8.9–10.3)
Chloride: 98 mmol/L (ref 98–111)
Creatinine, Ser: 1.26 mg/dL — ABNORMAL HIGH (ref 0.61–1.24)
GFR, Estimated: 58 mL/min — ABNORMAL LOW (ref >60)
Glucose, Bld: 124 mg/dL — ABNORMAL HIGH (ref 70–99)
Potassium: 4.8 mmol/L (ref 3.5–5.1)
Sodium: 131 mmol/L — ABNORMAL LOW (ref 135–145)
Total Bilirubin: 9.9 mg/dL — ABNORMAL HIGH (ref <1.2)
Total Protein: 5.2 g/dL — ABNORMAL LOW (ref 6.5–8.1)

## 2023-02-16 NOTE — Plan of Care (Signed)
  Problem: Health Behavior/Discharge Planning: Goal: Ability to manage health-related needs will improve Outcome: Progressing   Problem: Clinical Measurements: Goal: Ability to maintain clinical measurements within normal limits will improve Outcome: Progressing   Problem: Clinical Measurements: Goal: Will remain free from infection Outcome: Progressing   

## 2023-02-16 NOTE — Progress Notes (Signed)
PROGRESS NOTE Samuel Mayo  ZOX:[id number] DOB: September 05, 1945 DOA: 01/24/2023 PCP: Mayo, Samuel G, MD  Brief Narrative/Hospital Course: [phone] w/ Hx A-fib on Eliquis, HTN, seizure disorder, MVR, recent acute cholecystitis with abscess s/p cholecystostomy drain on 10/31 , exchanged 11/22 got admitted with obstructive jaundice on 11/22 with persistent occlusion of the cystic duct without opacification of the CBD. Seen by GI s/ p ERCP but unsuccessful and IR did PTC with biliary drain placement on 11/29.  On 12/1 became febrile hypotensive tachycardic-CCM and palliative care consulted. Left sided drain placement by IR on 12/6.  12/6 growing Pseudomonas aeruginosa and blood culture CA 19-9 elevated AFP normal oncology consulted.  Seen by ID advised Levaquin oral on discharge x 10 days  and outpatient follow-up 12/11> Currently with 3 drains: 1 cholecystostomy and 2 biliary drains-flushing once daily. 12/13> s/ p exchange and upsizing to 14 [place] right-sided biliary drain  by IR and LFTs improving     Subjective: Patient seen examined this morning Slept well Wife at the bedside She is concerned about going to SNF soon-but does have a place picked up  Overnight afebrile, not hypoxic  Labs stable LFTs continues to improve, creatinine slightly up w/ marginal intake  Assessment and Plan: Principal Problem:   Obstructive jaundice Active Problems:   Atrial fibrillation, chronic (HCC)   Hypertension   Seizure (HCC)   Cholecystitis   Acute biliary pancreatitis   Malnutrition of moderate degree   Hyperbilirubinemia   DNR (do not resuscitate)   DNR (do not resuscitate) discussion   Palliative care by specialist   Weakness generalized   Obstructive jaundice Adenocarcinoma  of bile duct: S/p ERCP- but unsuccessful then IR placed drains : has 1 cholecystostomy tube and 2 biliary drains  He was thought to have [place] tumor for which surgery is not offered here and plan was to refer to  [facility] and upsizing to 14 [place] right-sided biliary drain 12/12 by IR and LFTs has been improving since. If TB levels do not improve in the coming days  may need repeat CT or MRCP per IR. Cytology from biliary brushing positive for adenocarcinoma- Dr Mosetta Putt following for further plan of care Overall prognosis appears guarded to poor. Recent Labs  Lab 02/11/23 0207 02/12/23 0217 02/13/23 1122 02/14/23 0201 02/15/23 0203 02/16/23 0213  AST 74* 89* 109* 120* 114* 102*  ALT 81* 93* 102* 104* 93* 86*  ALKPHOS 187* 242* 293* 310* 277* 265*  BILITOT 12.5* 12.9* 12.2* 11.5* 10.1* 9.9*  PROT 4.7* 5.6* 5.8* 5.7* 5.1* 5.2*  ALBUMIN <1.5* 1.7* 1.8* 1.8* 1.8* 1.8*  PLT 328  --   --  507*  --   --    Sepsis secondary to cholangitis-febrile on 11/13 with leukocytosis Pseudomonas bacteremia Recent acute cholecystitis with abscess s/p cholecystostomy drain on 10/31 , exchanged 11/22: Seen by ID - cont Levaquin oral on discharge x 10 days  and outpatient follow-up.  Overall clinically stable. Repeat blood culture ordered 12/12 and NGTD, blood cx + from 12/6  Macrocytic anemia: Stable hb,transfuse as needed.  Follow-up with oncology trend labs here. Recent Labs  Lab 02/11/23 0207 02/12/23 0217 02/14/23 0201  HGB 8.1* 9.1* 9.9*  HCT 23.8* 27.1* 29.7*    Hyponatremia Metabolic acidosis Dehydration AKI-resolved: Electrolytes stable, although creatinine slightly uptrending, has had mild hyponatremia but expected due to his malignancy.  Encourage oral hydration, at risk of AKI if having poor intake.  Avoid nephrotoxic medication  Hypertension Hypotension Orthostatic hypotension: BP overall  doing well no hypotension.  Discontinued home  amlodipine, Toprol and  Cont midodrine, leg compressions  Paroxysmal A-fib: Rate controlled continue Eliquis. holding Toprol  as BP had been soft   Seizure disorder:  Stable on home Depakote. Elevated liver enzymes is not from Depakote but from  obstructive liver disease.   History of mitral valve regurgitation:  S/p annuloplasty.  Follow-up with cardiology as an outpatient.   Goals of care: Palliative care, critical care was involved in the care. Currently patient is DNR, overall prognosis poor. Might be a candidate for hospice/comfort care if further declines.     Deconditioning/debility:  Continue PT OT plan for skilled nursing facility at this time  Insomnia: Added trazodone at bedtime, cont Remeron   Severe malnutrition : Augment diet as below  Etiology: acute illness,  Cont  Remeron 7.5 mg daily  DVT prophylaxis: SCDs Start: 01/24/23 1337 Code Status:   Code Status: Limited: Do not attempt resuscitation (DNR) -DNR-LIMITED -Do Not Intubate/DNI  Family Communication: plan of care discussed with patient/wife and son at bedside. Patient status is: Remains hospitalized because of severity of illness Level of care: Progressive   Dispo: The patient is from: home            Anticipated disposition: SNF likely early coming week once okay with oncology -wife is concerned about going to rehab soon-will discuss with Dr. Mosetta Putt Monday hopefully discharge on Tuesday to SNF.  Objective: Vitals last 24 hrs: Vitals:   02/15/23 1943 02/15/23 2306 02/16/23 0351 02/16/23 0736  BP: 99/70 104/78 110/72 111/78  Pulse: 91 92 85 89  Resp: 18 (!) 21 17 17   Temp: 98.1 F (36.7 C) 97.9 F (36.6 C) 98.1 F (36.7 C) 98.4 F (36.9 C)  TempSrc: Oral Oral Oral Oral  SpO2: 96% 98% 93%   Weight:      Height:       Weight change:   Physical Examination: General exam: alert awake, appears frail, low mood HEENT:Oral mucosa moist, Ear/Nose WNL grossly Respiratory system: Bilaterally clear BS,no use of accessory muscle Cardiovascular system: S1 & S2 +, No JVD. Gastrointestinal system: Abdomen soft,NT,ND, BS+ biliary drain x2, cholecystomy drain + Nervous System: Alert, awake, moving all extremities,and following commands. Extremities: LE  edema neg,distal peripheral pulses palpable and warm.  Skin: No rashes, ++icterus. MSK: Normal muscle bulk,tone, power   Medications reviewed:  Scheduled Meds:  (feeding supplement) PROSource Plus  30 mL Oral BID BM   sodium chloride   Intravenous Once   apixaban  5 mg Oral BID   divalproex  750 mg Oral BID   feeding supplement  237 mL Oral QID   metroNIDAZOLE  500 mg Oral Q12H   midodrine  10 mg Oral TID WC   multivitamin with minerals  1 tablet Oral Daily   sodium chloride flush  10 mL Intracatheter Q8H   sodium chloride flush  3 mL Intravenous Q12H   sodium chloride flush  5 mL Intracatheter Q8H   sodium chloride flush  5 mL Intracatheter Q8H   traZODone  50 mg Oral QHS   Continuous Infusions:  levofloxacin (LEVAQUIN) IV Stopped (02/15/23 1230)    Diet Order             Diet regular Room service appropriate? Yes; Fluid consistency: Thin  Diet effective now                  Intake/Output Summary (Last 24 hours) at 02/16/2023 0916 Last data filed at 02/16/2023 [phone]  Gross per 24 hour  Intake 925.04 ml  Output 2025 ml  Net -1099.96 ml   Net IO Since Admission: -19,798.63 mL [02/16/23 0916]  Wt Readings from Last 3 Encounters:  02/11/23 83.5 kg  01/13/23 85.8 kg  01/03/23 91.3 kg     Unresulted Labs (From admission, onward)     Start     Ordered   02/14/23 0500  Comprehensive metabolic panel  Daily,   R     Question:  Specimen collection method  Answer:  Lab=Lab collect   02/13/23 0717          Data Reviewed: I have personally reviewed following labs and imaging studies CBC: Recent Labs  Lab 02/11/23 0207 02/12/23 0217 02/14/23 0201  WBC 8.9  --  12.1*  HGB 8.1* 9.1* 9.9*  HCT 23.8* 27.1* 29.7*  MCV 103.5*  --  104.9*  PLT 328  --  507*   Basic Metabolic Panel:  Recent Labs  Lab 02/11/23 0207 02/12/23 0217 02/13/23 1122 02/14/23 0201 02/15/23 0203 02/16/23 0213  NA 130* 128* 129* 132* 128* 131*  K 3.6 4.0 4.0 4.3 4.8 4.8  CL 96* 97* 96*  98 97* 98  CO2 22 21* 21* 23 23 25   GLUCOSE 86 104* 116* 107* 109* 124*  BUN 35* 36* 42* 39* 47* 45*  CREATININE 0.99 1.12 1.18 1.11 1.19 1.26*  CALCIUM 8.1* 8.6* 8.9 8.9 8.5* 8.8*  MG 2.2  --   --   --   --   --   PHOS 4.3  --   --   --   --   --    GFR: Estimated Creatinine Clearance: 51.5 mL/min (A) (by C-G formula based on SCr of 1.26 mg/dL (H)). Liver Function Tests:  Recent Labs  Lab 02/12/23 0217 02/13/23 1122 02/14/23 0201 02/15/23 0203 02/16/23 0213  AST 89* 109* 120* 114* 102*  ALT 93* 102* 104* 93* 86*  ALKPHOS 242* 293* 310* 277* 265*  BILITOT 12.9* 12.2* 11.5* 10.1* 9.9*  PROT 5.6* 5.8* 5.7* 5.1* 5.2*  ALBUMIN 1.7* 1.8* 1.8* 1.8* 1.8*  Sepsis Labs: No results for input(s): "PROCALCITON", "LATICACIDVEN" in the last 168 hours. Recent Results (from the past 240 hours)  Culture, blood (Routine X 2) w Reflex to ID Panel     Status: Abnormal   Collection Time: 02/07/23  2:28 PM   Specimen: BLOOD  Result Value Ref Range Status   Specimen Description BLOOD SITE NOT SPECIFIED  Final   Special Requests   Final    BOTTLES DRAWN AEROBIC ONLY Blood Culture adequate volume   Culture  Setup Time   Final    GRAM NEGATIVE RODS AEROBIC BOTTLE ONLY CRITICAL RESULT CALLED TO, READ BACK BY AND VERIFIED WITH: Meribeth Mattes [id number] AT 1356 BY EC Performed at [facility], [address], [place], [place] [address]    Culture PSEUDOMONAS AERUGINOSA (A)  Final   Report Status 02/10/2023 FINAL  Final   Organism ID, Bacteria PSEUDOMONAS AERUGINOSA  Final      Susceptibility   Pseudomonas aeruginosa - MIC*    CEFTAZIDIME 16 INTERMEDIATE Intermediate     CIPROFLOXACIN <=0.25 SENSITIVE Sensitive     GENTAMICIN 2 SENSITIVE Sensitive     IMIPENEM 1 SENSITIVE Sensitive     CEFEPIME 16 RESISTANT Resistant     * PSEUDOMONAS AERUGINOSA  Blood Culture ID Panel (Reflexed)     Status: Abnormal   Collection Time: 02/07/23  2:28 PM  Result Value Ref  Range Status   Enterococcus  faecalis NOT DETECTED NOT DETECTED Final   Enterococcus Faecium NOT DETECTED NOT DETECTED Final   Listeria monocytogenes NOT DETECTED NOT DETECTED Final   Staphylococcus species NOT DETECTED NOT DETECTED Final   Staphylococcus aureus (BCID) NOT DETECTED NOT DETECTED Final   Staphylococcus epidermidis NOT DETECTED NOT DETECTED Final   Staphylococcus lugdunensis NOT DETECTED NOT DETECTED Final   Streptococcus species NOT DETECTED NOT DETECTED Final   Streptococcus agalactiae NOT DETECTED NOT DETECTED Final   Streptococcus pneumoniae NOT DETECTED NOT DETECTED Final   Streptococcus pyogenes NOT DETECTED NOT DETECTED Final   A.calcoaceticus-baumannii NOT DETECTED NOT DETECTED Final   Bacteroides fragilis NOT DETECTED NOT DETECTED Final   Enterobacterales NOT DETECTED NOT DETECTED Final   Enterobacter cloacae complex NOT DETECTED NOT DETECTED Final   Escherichia coli NOT DETECTED NOT DETECTED Final   Klebsiella aerogenes NOT DETECTED NOT DETECTED Final   Klebsiella oxytoca NOT DETECTED NOT DETECTED Final   Klebsiella pneumoniae NOT DETECTED NOT DETECTED Final   Proteus species NOT DETECTED NOT DETECTED Final   Salmonella species NOT DETECTED NOT DETECTED Final   Serratia marcescens NOT DETECTED NOT DETECTED Final   Haemophilus influenzae NOT DETECTED NOT DETECTED Final   Neisseria meningitidis NOT DETECTED NOT DETECTED Final   Pseudomonas aeruginosa DETECTED (A) NOT DETECTED Final    Comment: CRITICAL RESULT CALLED TO, READ BACK BY AND VERIFIED WITH: PHARMD NATHAN GOAD [id number] AT 1356 BY EC    Stenotrophomonas maltophilia NOT DETECTED NOT DETECTED Final   Candida albicans NOT DETECTED NOT DETECTED Final   Candida auris NOT DETECTED NOT DETECTED Final   Candida glabrata NOT DETECTED NOT DETECTED Final   Candida krusei NOT DETECTED NOT DETECTED Final   Candida parapsilosis NOT DETECTED NOT DETECTED Final   Candida tropicalis NOT DETECTED NOT DETECTED Final   Cryptococcus neoformans/gattii  NOT DETECTED NOT DETECTED Final   CTX-M ESBL NOT DETECTED NOT DETECTED Final   Carbapenem resistance IMP NOT DETECTED NOT DETECTED Final   Carbapenem resistance KPC NOT DETECTED NOT DETECTED Final   Carbapenem resistance NDM NOT DETECTED NOT DETECTED Final   Carbapenem resistance VIM NOT DETECTED NOT DETECTED Final    Comment: Performed at [facility], [address] [place], [place] [address]  Culture, blood (Routine X 2) w Reflex to ID Panel     Status: Abnormal   Collection Time: 02/07/23  2:30 PM   Specimen: BLOOD  Result Value Ref Range Status   Specimen Description BLOOD SITE NOT SPECIFIED  Final   Special Requests   Final    BOTTLES DRAWN AEROBIC ONLY Blood Culture adequate volume   Culture  Setup Time GRAM NEGATIVE RODS AEROBIC BOTTLE ONLY   Final   Culture (A)  Final    PSEUDOMONAS AERUGINOSA SUSCEPTIBILITIES PERFORMED ON PREVIOUS CULTURE WITHIN THE LAST 5 DAYS. Performed at [facility], [address] [place], [place] [address]    Report Status 02/10/2023 FINAL  Final  Culture, blood (Routine X 2) w Reflex to ID Panel     Status: None (Preliminary result)   Collection Time: 02/13/23 11:22 AM   Specimen: BLOOD LEFT HAND  Result Value Ref Range Status   Specimen Description BLOOD LEFT HAND  Final   Special Requests   Final    BOTTLES DRAWN AEROBIC AND ANAEROBIC Blood Culture results may not be optimal due to an inadequate volume of blood received in culture bottles   Culture   Final  NO GROWTH 2 DAYS Performed at [facility], [address] [place], [place] [address]    Report Status PENDING  Incomplete  Culture, blood (Routine X 2) w Reflex to ID Panel     Status: None (Preliminary result)   Collection Time: 02/13/23 11:22 AM   Specimen: BLOOD RIGHT HAND  Result Value Ref Range Status   Specimen Description BLOOD RIGHT HAND  Final   Special Requests   Final    BOTTLES DRAWN AEROBIC AND ANAEROBIC Blood Culture results may not be optimal due  to an inadequate volume of blood received in culture bottles   Culture   Final    NO GROWTH 2 DAYS Performed at [facility], [address], [place], [place] [address]    Report Status PENDING  Incomplete  MRSA Next Gen by PCR, Nasal     Status: None   Collection Time: 02/13/23  7:56 PM   Specimen: Nasal Mucosa; Nasal Swab  Result Value Ref Range Status   MRSA by PCR Next Gen NOT DETECTED NOT DETECTED Final    Comment: (NOTE) The GeneXpert MRSA Assay (FDA approved for NASAL specimens only), is one component of a comprehensive MRSA colonization surveillance program. It is not intended to diagnose MRSA infection nor to guide or monitor treatment for MRSA infections. Test performance is not FDA approved in patients less than 40 years old. Performed at [facility], [address] [place], [place] [address]     Antimicrobials/Microbiology: Anti-infectives (From admission, onward)    Start     Dose/Rate Route Frequency Ordered Stop   02/13/23 1640  cefTRIAXone (ROCEPHIN) 1 g in sodium chloride 0.9 % 100 mL IVPB        over 30 Minutes  Continuous PRN 02/13/23 1654 02/13/23 1640   02/10/23 1030  levofloxacin (LEVAQUIN) IVPB 750 mg        750 mg 100 mL/hr over 90 Minutes Intravenous Every 24 hours 02/10/23 0931 02/20/23 0959   02/10/23 1030  metroNIDAZOLE (FLAGYL) tablet 500 mg        500 mg Oral Every 12 hours 02/10/23 0931 02/20/23 0959   02/07/23 1445  piperacillin-tazobactam (ZOSYN) IVPB 3.375 g  Status:  Discontinued        3.375 g 12.5 mL/hr over 240 Minutes Intravenous Every 8 hours 02/07/23 1347 02/10/23 0931   02/07/23 1002  cefTRIAXone (ROCEPHIN) 2 g in sodium chloride 0.9 % 100 mL IVPB        over 30 Minutes Intravenous Continuous PRN 02/07/23 1011 02/07/23 1017   02/07/23 0945  cefTRIAXone (ROCEPHIN) 2 g in sodium chloride 0.9 % 100 mL IVPB        2 g 200 mL/hr over 30 Minutes Intravenous To Radiology 02/07/23 0845 02/08/23 0945   02/06/23 1000   amoxicillin-clavulanate (AUGMENTIN) 875-125 MG per tablet 1 tablet  Status:  Discontinued        1 tablet Oral Every 12 hours 02/05/23 1108 02/07/23 1347   02/05/23 1000  Vancomycin (VANCOCIN) 1,500 mg in sodium chloride 0.9 % 500 mL IVPB        1,500 mg 250 mL/hr over 120 Minutes Intravenous Every 24 hours 02/05/23 0903 02/05/23 1229   02/04/23 1000  vancomycin (VANCOCIN) IVPB 1000 mg/200 mL premix  Status:  Discontinued        1,000 mg 200 mL/hr over 60 Minutes Intravenous Every 24 hours 02/03/23 1122 02/05/23 0903   02/03/23 1000  Vancomycin (VANCOCIN) 1,500 mg in sodium  chloride 0.9 % 500 mL IVPB  Status:  Discontinued        1,500 mg 250 mL/hr over 120 Minutes Intravenous Every 24 hours 02/02/23 1006 02/03/23 1122   02/02/23 2100  vancomycin (VANCOCIN) IVPB 1000 mg/200 mL premix  Status:  Discontinued        1,000 mg 200 mL/hr over 60 Minutes Intravenous Every 12 hours 02/02/23 0811 02/02/23 1006   02/02/23 1100  vancomycin (VANCOCIN) IVPB 1000 mg/200 mL premix       Placed in "Followed by" Linked Group   1,000 mg 200 mL/hr over 60 Minutes Intravenous  Once 02/02/23 0913 02/02/23 1500   02/02/23 1000  vancomycin (VANCOCIN) IVPB 1000 mg/200 mL premix       Placed in "Followed by" Linked Group   1,000 mg 200 mL/hr over 60 Minutes Intravenous  Once 02/02/23 0913 02/02/23 1340   02/02/23 0900  vancomycin (VANCOREADY) IVPB 2000 mg/400 mL  Status:  Discontinued        2,000 mg 200 mL/hr over 120 Minutes Intravenous  Once 02/02/23 0804 02/02/23 0913   02/02/23 0900  piperacillin-tazobactam (ZOSYN) IVPB 3.375 g        3.375 g 12.5 mL/hr over 240 Minutes Intravenous Every 8 hours 02/02/23 0805 02/05/23 2359   02/01/23 2200  amoxicillin-clavulanate (AUGMENTIN) 875-125 MG per tablet 1 tablet  Status:  Discontinued        1 tablet Oral Every 12 hours 02/01/23 1150 02/02/23 0738   01/31/23 1058  cefOXitin (MEFOXIN) 2 g in sodium chloride 0.9 % 100 mL IVPB        over 30 Minutes  Continuous PRN  01/31/23 1058 01/31/23 1058   01/31/23 0000  cefOXitin (MEFOXIN) 2 g in sodium chloride 0.9 % 100 mL IVPB  Status:  Discontinued        2 g 200 mL/hr over 30 Minutes Intravenous To Radiology 01/30/23 1014 01/30/23 1015   01/25/23 1600  ampicillin-sulbactam (UNASYN) 1.5 g in sodium chloride 0.9 % 100 mL IVPB  Status:  Discontinued        1.5 g 200 mL/hr over 30 Minutes Intravenous Every 6 hours 01/25/23 1525 02/01/23 1150         Component Value Date/Time   SDES BLOOD LEFT HAND 02/13/2023 1122   SDES BLOOD RIGHT HAND 02/13/2023 1122   SPECREQUEST  02/13/2023 1122    BOTTLES DRAWN AEROBIC AND ANAEROBIC Blood Culture results may not be optimal due to an inadequate volume of blood received in culture bottles   SPECREQUEST  02/13/2023 1122    BOTTLES DRAWN AEROBIC AND ANAEROBIC Blood Culture results may not be optimal due to an inadequate volume of blood received in culture bottles   CULT  02/13/2023 1122    NO GROWTH 2 DAYS Performed at [facility], [address], [place], [place] [address]    CULT  02/13/2023 1122    NO GROWTH 2 DAYS Performed at [facility], [address] [place], [place] [address]    REPTSTATUS PENDING 02/13/2023 1122   REPTSTATUS PENDING 02/13/2023 1122    Radiology Studies: No results found.   LOS: 21 days   Total time spent in review of labs and imaging, patient evaluation, formulation of plan, documentation and communication with family: 35 minutes  Lanae Boast, MD [facility]  02/16/2023, 9:16 AM

## 2023-02-16 NOTE — Progress Notes (Signed)
Mobility Specialist Progress Note:   02/16/23 1400  Orthostatic Lying   BP- Lying 111/80  Mobility  Activity Ambulated with assistance to bathroom;Ambulated with assistance in hallway  Level of Assistance Contact guard assist, steadying assist  Assistive Device Front wheel walker  Distance Ambulated (ft) 135 ft (15' + 120')  Activity Response Tolerated well  Mobility Referral Yes  Mobility visit 1 Mobility  Mobility Specialist Start Time (ACUTE ONLY) 1357  Mobility Specialist Stop Time (ACUTE ONLY) 1427  Mobility Specialist Time Calculation (min) (ACUTE ONLY) 30 min    Pt received in bed, agreeable to mobility. Voided in BR before entering hallway. Asymptomatic w/ no complaints throughout. Pt left in bed with call bell and all needs met.  D'Vante Earlene Plater Mobility Specialist Please contact via Special educational needs teacher or Rehab office at [phone]

## 2023-02-17 DIAGNOSIS — K831 Obstruction of bile duct: Secondary | ICD-10-CM | POA: Diagnosis not present

## 2023-02-17 LAB — COMPREHENSIVE METABOLIC PANEL
ALT: 83 U/L — ABNORMAL HIGH (ref 0–44)
AST: 114 U/L — ABNORMAL HIGH (ref 15–41)
Albumin: 1.9 g/dL — ABNORMAL LOW (ref 3.5–5.0)
Alkaline Phosphatase: 253 U/L — ABNORMAL HIGH (ref 38–126)
Anion gap: 8 (ref 5–15)
BUN: 41 mg/dL — ABNORMAL HIGH (ref 8–23)
CO2: 26 mmol/L (ref 22–32)
Calcium: 8.7 mg/dL — ABNORMAL LOW (ref 8.9–10.3)
Chloride: 94 mmol/L — ABNORMAL LOW (ref 98–111)
Creatinine, Ser: 1.11 mg/dL (ref 0.61–1.24)
GFR, Estimated: >60 mL/min (ref >60)
Glucose, Bld: 122 mg/dL — ABNORMAL HIGH (ref 70–99)
Potassium: 4.6 mmol/L (ref 3.5–5.1)
Sodium: 128 mmol/L — ABNORMAL LOW (ref 135–145)
Total Bilirubin: 9 mg/dL — ABNORMAL HIGH (ref <1.2)
Total Protein: 5.1 g/dL — ABNORMAL LOW (ref 6.5–8.1)

## 2023-02-17 LAB — CBC WITH DIFFERENTIAL/PLATELET
Abs Immature Granulocytes: 0.4 10*3/uL — ABNORMAL HIGH (ref 0.00–0.07)
Basophils Absolute: 0.1 10*3/uL (ref 0.0–0.1)
Basophils Relative: 1 %
Eosinophils Absolute: 0.1 10*3/uL (ref 0.0–0.5)
Eosinophils Relative: 1 %
HCT: 30 % — ABNORMAL LOW (ref 39.0–52.0)
Hemoglobin: 9.9 g/dL — ABNORMAL LOW (ref 13.0–17.0)
Immature Granulocytes: 5 %
Lymphocytes Relative: 13 %
Lymphs Abs: 1.1 10*3/uL (ref 0.7–4.0)
MCH: 35.1 pg — ABNORMAL HIGH (ref 26.0–34.0)
MCHC: 33 g/dL (ref 30.0–36.0)
MCV: 106.4 fL — ABNORMAL HIGH (ref 80.0–100.0)
Monocytes Absolute: 1.1 10*3/uL — ABNORMAL HIGH (ref 0.1–1.0)
Monocytes Relative: 12 %
Neutro Abs: 6.1 10*3/uL (ref 1.7–7.7)
Neutrophils Relative %: 68 %
Platelets: 359 10*3/uL (ref 150–400)
RBC: 2.82 MIL/uL — ABNORMAL LOW (ref 4.22–5.81)
RDW: 15.1 % (ref 11.5–15.5)
WBC: 8.8 10*3/uL (ref 4.0–10.5)
nRBC: 0 % (ref 0.0–0.2)

## 2023-02-17 MED ORDER — LEVOFLOXACIN 750 MG PO TABS
750.0000 mg | ORAL_TABLET | Freq: Every day | ORAL | Status: AC
  Administered 2023-02-18 – 2023-02-20 (×3): 750 mg via ORAL
  Filled 2023-02-17 (×3): qty 1

## 2023-02-17 NOTE — Progress Notes (Signed)
Physical Therapy Treatment Patient Details Name: Samuel Mayo MRN: [id number] DOB: Mar 18, 1945 Today's Date: 02/17/2023   History of Present Illness 77 y.o. male presents to [facility] 01/24/23 with worsening jaundice and follow up imaging from recent admit that showed non-visualization of CBD. MRI/MRCP showed increased mild to moderate intrahepatic bile duct dilation and new mural thickening and enhancement of the extrahepatic bile duct suspicious for cholangitis. IR performed PTC 11/29 with placement of internal/external drain. 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 Recent admit from 10/28 -11/1 for cholecystitis, diagnosed with Mirizzi syndrome ,underwent cholecystostomy. PMHx: seizure disorder on depakote, PAF, HTN, a-fib, acute biliary pancreatitis, cholecystitis, gout    PT Comments  Pt received sitting EOB and wanting to stand and move due to buttocks discomfort in bed. Bed raised 6" and pt able to stand to RW with supervision. Pt with very small steps today and mildly unsteady, different pattern than last time this PT worked with him. Worked towards longer, smoother stride. Pt fatigued after 100', took seated rest break. Then worked on sitting and standing balance exercises, family participated. PT will continue to follow.     If plan is discharge home, recommend the following: A little help with walking and/or transfers;Help with stairs or ramp for entrance;Assist for transportation   Can travel by private vehicle     Yes  Equipment Recommendations  Rollator (4 wheels)    Recommendations for Other Services       Precautions / Restrictions Precautions Precautions: Fall Precaution Comments: biliary tubes x 3 RUQ, orthostatic hypotension Restrictions Weight Bearing Restrictions Per Provider Order: No     Mobility  Bed Mobility               General bed mobility comments: received sitting EOB    Transfers Overall transfer level: Needs  assistance Equipment used: Rolling walker (2 wheels) Transfers: Sit to/from Stand, Bed to chair/wheelchair/BSC Sit to Stand: Min assist   Step pivot transfers: Contact guard assist       General transfer comment: pt needed bed elevated for successful sit>stand, no physical assist after that though. Was able to stand from low recliner with use of arm rests. Pt demonstrated good control of stand > sit today    Ambulation/Gait Ambulation/Gait assistance: Min assist Gait Distance (Feet): 100 Feet Assistive device: Rolling walker (2 wheels) Gait Pattern/deviations: Step-to pattern, Decreased stride length, Trunk flexed, Wide base of support Gait velocity: decreased Gait velocity interpretation: <1.8 ft/sec, indicate of risk for recurrent falls   General Gait Details: very short steps, 1/2 length of foot. Worked on increasing and smoothing out gait.   Stairs             Wheelchair Mobility     Tilt Bed    Modified Rankin (Stroke Patients Only)       Balance Overall balance assessment: Needs assistance Sitting-balance support: Feet supported Sitting balance-Leahy Scale: Good     Standing balance support: Bilateral upper extremity supported Standing balance-Leahy Scale: Fair Standing balance comment: worked on balloon taps in sitting then standing in multiple planes of motion. Had granddaughter participate in this when she arrived. CGA throughout                            Cognition Arousal: Alert Behavior During Therapy: Flat affect Overall Cognitive Status: Within Functional Limits for tasks assessed  Problem Solving: Slow processing General Comments: pt continues to have intermittent delays with responses but some times quick today. Varied throughout session and seems partially related to fatigue        Exercises      General Comments General comments (skin integrity, edema, etc.): HR 111 bpm with  activity. SPO2 100% on RA      Pertinent Vitals/Pain Pain Assessment Pain Assessment: Faces Faces Pain Scale: Hurts a little bit Pain Location: buttocks Pain Descriptors / Indicators: Sore Pain Intervention(s): Limited activity within patient's tolerance, Monitored during session    Home Living                          Prior Function            PT Goals (current goals can now be found in the care plan section) Acute Rehab PT Goals Patient Stated Goal: to work on balance PT Goal Formulation: With patient/family Time For Goal Achievement: 03/03/23 Potential to Achieve Goals: Good Progress towards PT goals: Progressing toward goals    Frequency    Min 1X/week      PT Plan      Co-evaluation              AM-PAC PT "6 Clicks" Mobility   Outcome Measure  Help needed turning from your back to your side while in a flat bed without using bedrails?: A Little Help needed moving from lying on your back to sitting on the side of a flat bed without using bedrails?: A Little Help needed moving to and from a bed to a chair (including a wheelchair)?: A Little Help needed standing up from a chair using your arms (e.g., wheelchair or bedside chair)?: A Little Help needed to walk in hospital room?: A Little Help needed climbing 3-5 steps with a railing? : A Lot 6 Click Score: 17    End of Session Equipment Utilized During Treatment: Gait belt Activity Tolerance: Patient tolerated treatment well Patient left: in bed;with call bell/phone within reach;with family/visitor present Nurse Communication: Mobility status PT Visit Diagnosis: Other abnormalities of gait and mobility (R26.89);Muscle weakness (generalized) (M62.81)     Time: 8657-8469 PT Time Calculation (min) (ACUTE ONLY): 34 min  Charges:    $Gait Training: 8-22 mins $Therapeutic Exercise: 8-22 mins PT General Charges $$ ACUTE PT VISIT: 1 Visit                     Lyanne Co, PT  Acute  Rehab Services Secure chat preferred Office [phone]    Lawana Chambers Ketan Renz 02/17/2023, 11:38 AM

## 2023-02-17 NOTE — Progress Notes (Signed)
PROGRESS NOTE Samuel Mayo  UEA:[id number] DOB: 03-11-1945 DOA: 01/24/2023 PCP: [name], [name], MD  Brief Narrative/Hospital Course: [phone] w/ Hx A-fib on Eliquis, HTN, seizure disorder, MVR, recent acute cholecystitis with abscess s/p cholecystostomy drain on 10/31 , exchanged 11/22 got admitted with obstructive jaundice on 11/22 with persistent occlusion of the cystic duct without opacification of the CBD. Seen by GI s/ p ERCP but unsuccessful and IR did PTC with biliary drain placement on 11/29.  On 12/1 became febrile hypotensive tachycardic-CCM and palliative care consulted. Left sided drain placement by IR on 12/6.  12/6 growing Pseudomonas aeruginosa and blood culture CA 19-9 elevated AFP normal oncology consulted.  Seen by ID advised Levaquin oral on discharge x 10 days  and outpatient follow-up 12/11> Currently with 3 drains: 1 cholecystostomy and 2 biliary drains-flushing once daily. 12/13> s/ p exchange and upsizing to 14 [place] right-sided biliary drain  by IR and LFTs improving     Subjective: Seen and examined this am Slept well No new complaints Overnight afebrile BP stable  Labs show sodium slightly low 128 but total bilirubin nicely improving at 9  Creat improved to 1.1 from 1.2 w/ oral intake  Assessment and Plan: Principal Problem:   Obstructive jaundice Active Problems:   Atrial fibrillation, chronic (HCC)   Hypertension   Seizure (HCC)   Cholecystitis   Acute biliary pancreatitis   Malnutrition of moderate degree   Hyperbilirubinemia   DNR (do not resuscitate)   DNR (do not resuscitate) discussion   Palliative care by specialist   Weakness generalized   Obstructive jaundice Adenocarcinoma  of bile duct: S/p ERCP- but unsuccessful then IR placed drains : has 1 cholecystostomy tube and 2 biliary drains  He was thought to have [place]kin tumor for which surgery is not offered here and plan was to refer to [facility] Exchange and upsizing to 14 [place]  right-sided biliary drain 12/12 by IR and LFTs improving since. If TB not improving may need further CT/MRCP per IR. Cytology Biliary brushing> w/ Adenocarcinoma-Dr Mosetta Putt following and to see today for further plan of care Overall prognosis appears guarded to poor. Recent Labs  Lab 02/11/23 0207 02/12/23 0217 02/13/23 1122 02/14/23 0201 02/15/23 0203 02/16/23 0213 02/17/23 0205  AST 74*   < > 109* 120* 114* 102* 114*  ALT 81*   < > 102* 104* 93* 86* 83*  ALKPHOS 187*   < > 293* 310* 277* 265* 253*  BILITOT 12.5*   < > 12.2* 11.5* 10.1* 9.9* 9.0*  PROT 4.7*   < > 5.8* 5.7* 5.1* 5.2* 5.1*  ALBUMIN <1.5*   < > 1.8* 1.8* 1.8* 1.8* 1.9*  PLT 328  --   --  507*  --   --   --    < > = values in this interval not displayed.   Sepsis secondary to cholangitis-febrile on 11/13 with leukocytosis Pseudomonas bacteremia Recent acute cholecystitis with abscess s/p cholecystostomy drain on 10/31 , exchanged 11/22: Seen by ID - cont Levaquin + falgly- cont x10 days  doing ok, no fever Repeat blood culture ordered 12/12 and NGTD, blood cx + from 12/6  Macrocytic anemia: Stable hb,transfuse as needed.  Follow-up with oncology trend labs here. Recent Labs  Lab 02/11/23 0207 02/12/23 0217 02/14/23 0201  HGB 8.1* 9.1* 9.9*  HCT 23.8* 27.1* 29.7*    Hyponatremia Metabolic acidosis Dehydration AKI-resolved: Electrolytes stable with mild hyponatremia encourage oral intake.  AKI has resolved  Hypertension Hypotension Orthostatic hypotension: BP  stable.discontinued home  amlodipine, Toprol and. Cont midodrine, leg compressions  Paroxysmal A-fib: Rate controlled continue Eliquis. holding Toprol  as BP had been soft   Seizure disorder:  Stable on home Depakote. Elevated liver enzymes is not from Depakote but from obstructive liver disease.   History of mitral valve regurgitation:  S/p annuloplasty.  Follow-up with cardiology as an outpatient.   Goals of care: Palliative care, critical  care was involved in the care. Currently patient is DNR, overall prognosis poor. Might be a candidate for hospice/comfort care if further declines.     Deconditioning/debility:  Continue PT OT plan for skilled nursing facility at this time  Insomnia: Added trazodone at bedtime, cont Remeron   Severe malnutrition : Augment diet as below  Etiology: acute illness,  Cont  Remeron 7.5 mg daily  DVT prophylaxis: SCDs Start: 01/24/23 1337 Code Status:   Code Status: Limited: Do not attempt resuscitation (DNR) -DNR-LIMITED -Do Not Intubate/DNI  Family Communication: plan of care discussed with patient/wife updated on the phone  Patient status is: Remains hospitalized because of severity of illness Level of care: Progressive  Dispo: The patient is from: home            Anticipated disposition: SNF soon once further input from oncology obtain-wife is concerned about going to rehab soon wants to d/w Dr. Mosetta Putt  Objective: Vitals last 24 hrs: Vitals:   02/16/23 1929 02/16/23 2320 02/17/23 0347 02/17/23 0735  BP: 100/70 101/76 95/68 92/62   Pulse: 82 100 87 87  Resp: 17 18 19 15   Temp: 97.9 F (36.6 C) 98.4 F (36.9 C) 97.9 F (36.6 C) 98.2 F (36.8 C)  TempSrc: Oral Oral Oral Oral  SpO2: 98% 97% 96% 98%  Weight:      Height:       Weight change:   Physical Examination: General exam: alert awake, oriented x 3, weak frail HEENT:Oral mucosa moist, Ear/Nose WNL grossly Respiratory system: Bilaterally clear BS,no use of accessory muscle Cardiovascular system: S1 & S2 +, No JVD. Gastrointestinal system: Abdomen soft,NT,ND,BS+, I biliary drain+ cholecystostomy drain+ Nervous System: Alert, awake, moving all extremities,and following commands. Extremities: LE edema neg,distal peripheral pulses palpable and warm.  Skin: No rashes, ++ icterus. MSK: Normal muscle bulk,tone, power   Medications reviewed:  Scheduled Meds:  (feeding supplement) PROSource Plus  30 mL Oral BID BM   sodium  chloride   Intravenous Once   apixaban  5 mg Oral BID   divalproex  750 mg Oral BID   feeding supplement  237 mL Oral QID   metroNIDAZOLE  500 mg Oral Q12H   midodrine  10 mg Oral TID WC   multivitamin with minerals  1 tablet Oral Daily   sodium chloride flush  10 mL Intracatheter Q8H   sodium chloride flush  3 mL Intravenous Q12H   sodium chloride flush  5 mL Intracatheter Q8H   sodium chloride flush  5 mL Intracatheter Q8H   traZODone  50 mg Oral QHS   Continuous Infusions:  levofloxacin (LEVAQUIN) IV 750 mg (02/16/23 0932)    Diet Order             Diet regular Room service appropriate? Yes; Fluid consistency: Thin  Diet effective now                  Intake/Output Summary (Last 24 hours) at 02/17/2023 1029 Last data filed at 02/17/2023 0738 Gross per 24 hour  Intake 540 ml  Output 2000 ml  Net -  1460 ml   Net IO Since Admission: -21,203.63 mL [02/17/23 1029]  Wt Readings from Last 3 Encounters:  02/11/23 83.5 kg  01/13/23 85.8 kg  01/03/23 91.3 kg     Unresulted Labs (From admission, onward)     Start     Ordered   02/17/23 0845  PSA, total and free  Once,   R       Question:  Specimen collection method  Answer:  Lab=Lab collect   02/17/23 0844   02/17/23 0837  CBC with Differential/Platelet  Once,   R       Question:  Specimen collection method  Answer:  Lab=Lab collect   02/17/23 0836          Data Reviewed: I have personally reviewed following labs and imaging studies CBC: Recent Labs  Lab 02/11/23 0207 02/12/23 0217 02/14/23 0201  WBC 8.9  --  12.1*  HGB 8.1* 9.1* 9.9*  HCT 23.8* 27.1* 29.7*  MCV 103.5*  --  104.9*  PLT 328  --  507*   Basic Metabolic Panel:  Recent Labs  Lab 02/11/23 0207 02/12/23 0217 02/13/23 1122 02/14/23 0201 02/15/23 0203 02/16/23 0213 02/17/23 0205  NA 130*   < > 129* 132* 128* 131* 128*  K 3.6   < > 4.0 4.3 4.8 4.8 4.6  CL 96*   < > 96* 98 97* 98 94*  CO2 22   < > 21* 23 23 25 26   GLUCOSE 86   < > 116*  107* 109* 124* 122*  BUN 35*   < > 42* 39* 47* 45* 41*  CREATININE 0.99   < > 1.18 1.11 1.19 1.26* 1.11  CALCIUM 8.1*   < > 8.9 8.9 8.5* 8.8* 8.7*  MG 2.2  --   --   --   --   --   --   PHOS 4.3  --   --   --   --   --   --    < > = values in this interval not displayed.   GFR: Estimated Creatinine Clearance: 58.4 mL/min (by C-G formula based on SCr of 1.11 mg/dL). Liver Function Tests:  Recent Labs  Lab 02/13/23 1122 02/14/23 0201 02/15/23 0203 02/16/23 0213 02/17/23 0205  AST 109* 120* 114* 102* 114*  ALT 102* 104* 93* 86* 83*  ALKPHOS 293* 310* 277* 265* 253*  BILITOT 12.2* 11.5* 10.1* 9.9* 9.0*  PROT 5.8* 5.7* 5.1* 5.2* 5.1*  ALBUMIN 1.8* 1.8* 1.8* 1.8* 1.9*  Sepsis Labs: No results for input(s): "PROCALCITON", "LATICACIDVEN" in the last 168 hours. Recent Results (from the past 240 hours)  Culture, blood (Routine X 2) w Reflex to ID Panel     Status: Abnormal   Collection Time: 02/07/23  2:28 PM   Specimen: BLOOD  Result Value Ref Range Status   Specimen Description BLOOD SITE NOT SPECIFIED  Final   Special Requests   Final    BOTTLES DRAWN AEROBIC ONLY Blood Culture adequate volume   Culture  Setup Time   Final    GRAM NEGATIVE RODS AEROBIC BOTTLE ONLY CRITICAL RESULT CALLED TO, READ BACK BY AND VERIFIED WITH: Meribeth Mattes [id number] AT 1356 BY EC Performed at [facility], [address] [place], [place] [address]    Culture PSEUDOMONAS AERUGINOSA (A)  Final   Report Status 02/10/2023 FINAL  Final   Organism ID, Bacteria PSEUDOMONAS AERUGINOSA  Final      Susceptibility   Pseudomonas aeruginosa -  MIC*    CEFTAZIDIME 16 INTERMEDIATE Intermediate     CIPROFLOXACIN <=0.25 SENSITIVE Sensitive     GENTAMICIN 2 SENSITIVE Sensitive     IMIPENEM 1 SENSITIVE Sensitive     CEFEPIME 16 RESISTANT Resistant     * PSEUDOMONAS AERUGINOSA  Blood Culture ID Panel (Reflexed)     Status: Abnormal   Collection Time: 02/07/23  2:28 PM  Result Value Ref Range Status    Enterococcus faecalis NOT DETECTED NOT DETECTED Final   Enterococcus Faecium NOT DETECTED NOT DETECTED Final   Listeria monocytogenes NOT DETECTED NOT DETECTED Final   Staphylococcus species NOT DETECTED NOT DETECTED Final   Staphylococcus aureus (BCID) NOT DETECTED NOT DETECTED Final   Staphylococcus epidermidis NOT DETECTED NOT DETECTED Final   Staphylococcus lugdunensis NOT DETECTED NOT DETECTED Final   Streptococcus species NOT DETECTED NOT DETECTED Final   Streptococcus agalactiae NOT DETECTED NOT DETECTED Final   Streptococcus pneumoniae NOT DETECTED NOT DETECTED Final   Streptococcus pyogenes NOT DETECTED NOT DETECTED Final   A.calcoaceticus-baumannii NOT DETECTED NOT DETECTED Final   Bacteroides fragilis NOT DETECTED NOT DETECTED Final   Enterobacterales NOT DETECTED NOT DETECTED Final   Enterobacter cloacae complex NOT DETECTED NOT DETECTED Final   Escherichia coli NOT DETECTED NOT DETECTED Final   Klebsiella aerogenes NOT DETECTED NOT DETECTED Final   Klebsiella oxytoca NOT DETECTED NOT DETECTED Final   Klebsiella pneumoniae NOT DETECTED NOT DETECTED Final   Proteus species NOT DETECTED NOT DETECTED Final   Salmonella species NOT DETECTED NOT DETECTED Final   Serratia marcescens NOT DETECTED NOT DETECTED Final   Haemophilus influenzae NOT DETECTED NOT DETECTED Final   Neisseria meningitidis NOT DETECTED NOT DETECTED Final   Pseudomonas aeruginosa DETECTED (A) NOT DETECTED Final    Comment: CRITICAL RESULT CALLED TO, READ BACK BY AND VERIFIED WITH: PHARMD NATHAN GOAD [phone] AT 1356 BY EC    Stenotrophomonas maltophilia NOT DETECTED NOT DETECTED Final   Candida albicans NOT DETECTED NOT DETECTED Final   Candida auris NOT DETECTED NOT DETECTED Final   Candida glabrata NOT DETECTED NOT DETECTED Final   Candida krusei NOT DETECTED NOT DETECTED Final   Candida parapsilosis NOT DETECTED NOT DETECTED Final   Candida tropicalis NOT DETECTED NOT DETECTED Final   Cryptococcus  neoformans/gattii NOT DETECTED NOT DETECTED Final   CTX-M ESBL NOT DETECTED NOT DETECTED Final   Carbapenem resistance IMP NOT DETECTED NOT DETECTED Final   Carbapenem resistance KPC NOT DETECTED NOT DETECTED Final   Carbapenem resistance NDM NOT DETECTED NOT DETECTED Final   Carbapenem resistance VIM NOT DETECTED NOT DETECTED Final    Comment: Performed at [facility], [address] [place], [place] [address]  Culture, blood (Routine X 2) w Reflex to ID Panel     Status: Abnormal   Collection Time: 02/07/23  2:30 PM   Specimen: BLOOD  Result Value Ref Range Status   Specimen Description BLOOD SITE NOT SPECIFIED  Final   Special Requests   Final    BOTTLES DRAWN AEROBIC ONLY Blood Culture adequate volume   Culture  Setup Time GRAM NEGATIVE RODS AEROBIC BOTTLE ONLY   Final   Culture (A)  Final    PSEUDOMONAS AERUGINOSA SUSCEPTIBILITIES PERFORMED ON PREVIOUS CULTURE WITHIN THE LAST 5 DAYS. Performed at [facility], [address], [place], [place] [address]    Report Status 02/10/2023 FINAL  Final  Culture, blood (Routine X 2) w Reflex to ID Panel     Status: None (Preliminary  result)   Collection Time: 02/13/23 11:22 AM   Specimen: BLOOD LEFT HAND  Result Value Ref Range Status   Specimen Description BLOOD LEFT HAND  Final   Special Requests   Final    BOTTLES DRAWN AEROBIC AND ANAEROBIC Blood Culture results may not be optimal due to an inadequate volume of blood received in culture bottles   Culture   Final    NO GROWTH 4 DAYS Performed at [facility], [address], [place], [place] [address]    Report Status PENDING  Incomplete  Culture, blood (Routine X 2) w Reflex to ID Panel     Status: None (Preliminary result)   Collection Time: 02/13/23 11:22 AM   Specimen: BLOOD RIGHT HAND  Result Value Ref Range Status   Specimen Description BLOOD RIGHT HAND  Final   Special Requests   Final    BOTTLES DRAWN AEROBIC AND ANAEROBIC Blood Culture results may  not be optimal due to an inadequate volume of blood received in culture bottles   Culture   Final    NO GROWTH 4 DAYS Performed at [facility], [address] [place], [place] [address]    Report Status PENDING  Incomplete  MRSA Next Gen by PCR, Nasal     Status: None   Collection Time: 02/13/23  7:56 PM   Specimen: Nasal Mucosa; Nasal Swab  Result Value Ref Range Status   MRSA by PCR Next Gen NOT DETECTED NOT DETECTED Final    Comment: (NOTE) The GeneXpert MRSA Assay (FDA approved for NASAL specimens only), is one component of a comprehensive MRSA colonization surveillance program. It is not intended to diagnose MRSA infection nor to guide or monitor treatment for MRSA infections. Test performance is not FDA approved in patients less than 1 years old. Performed at [facility], [address] [place], [place] [address]     Antimicrobials/Microbiology: Anti-infectives (From admission, onward)    Start     Dose/Rate Route Frequency Ordered Stop   02/13/23 1640  cefTRIAXone (ROCEPHIN) 1 g in sodium chloride 0.9 % 100 mL IVPB        over 30 Minutes  Continuous PRN 02/13/23 1654 02/13/23 1640   02/10/23 1030  levofloxacin (LEVAQUIN) IVPB 750 mg        750 mg 100 mL/hr over 90 Minutes Intravenous Every 24 hours 02/10/23 0931 02/20/23 0959   02/10/23 1030  metroNIDAZOLE (FLAGYL) tablet 500 mg        500 mg Oral Every 12 hours 02/10/23 0931 02/20/23 0959   02/07/23 1445  piperacillin-tazobactam (ZOSYN) IVPB 3.375 g  Status:  Discontinued        3.375 g 12.5 mL/hr over 240 Minutes Intravenous Every 8 hours 02/07/23 1347 02/10/23 0931   02/07/23 1002  cefTRIAXone (ROCEPHIN) 2 g in sodium chloride 0.9 % 100 mL IVPB        over 30 Minutes Intravenous Continuous PRN 02/07/23 1011 02/07/23 1017   02/07/23 0945  cefTRIAXone (ROCEPHIN) 2 g in sodium chloride 0.9 % 100 mL IVPB        2 g 200 mL/hr over 30 Minutes Intravenous To Radiology 02/07/23 0845 02/08/23 0945   02/06/23  1000  amoxicillin-clavulanate (AUGMENTIN) 875-125 MG per tablet 1 tablet  Status:  Discontinued        1 tablet Oral Every 12 hours 02/05/23 1108 02/07/23 1347   02/05/23 1000  Vancomycin (VANCOCIN) 1,500 mg in sodium chloride 0.9 % 500 mL IVPB  1,500 mg 250 mL/hr over 120 Minutes Intravenous Every 24 hours 02/05/23 0903 02/05/23 1229   02/04/23 1000  vancomycin (VANCOCIN) IVPB 1000 mg/200 mL premix  Status:  Discontinued        1,000 mg 200 mL/hr over 60 Minutes Intravenous Every 24 hours 02/03/23 1122 02/05/23 0903   02/03/23 1000  Vancomycin (VANCOCIN) 1,500 mg in sodium chloride 0.9 % 500 mL IVPB  Status:  Discontinued        1,500 mg 250 mL/hr over 120 Minutes Intravenous Every 24 hours 02/02/23 1006 02/03/23 1122   02/02/23 2100  vancomycin (VANCOCIN) IVPB 1000 mg/200 mL premix  Status:  Discontinued        1,000 mg 200 mL/hr over 60 Minutes Intravenous Every 12 hours 02/02/23 0811 02/02/23 1006   02/02/23 1100  vancomycin (VANCOCIN) IVPB 1000 mg/200 mL premix       Placed in "Followed by" Linked Group   1,000 mg 200 mL/hr over 60 Minutes Intravenous  Once 02/02/23 0913 02/02/23 1500   02/02/23 1000  vancomycin (VANCOCIN) IVPB 1000 mg/200 mL premix       Placed in "Followed by" Linked Group   1,000 mg 200 mL/hr over 60 Minutes Intravenous  Once 02/02/23 0913 02/02/23 1340   02/02/23 0900  vancomycin (VANCOREADY) IVPB 2000 mg/400 mL  Status:  Discontinued        2,000 mg 200 mL/hr over 120 Minutes Intravenous  Once 02/02/23 0804 02/02/23 0913   02/02/23 0900  piperacillin-tazobactam (ZOSYN) IVPB 3.375 g        3.375 g 12.5 mL/hr over 240 Minutes Intravenous Every 8 hours 02/02/23 0805 02/05/23 2359   02/01/23 2200  amoxicillin-clavulanate (AUGMENTIN) 875-125 MG per tablet 1 tablet  Status:  Discontinued        1 tablet Oral Every 12 hours 02/01/23 1150 02/02/23 0738   01/31/23 1058  cefOXitin (MEFOXIN) 2 g in sodium chloride 0.9 % 100 mL IVPB        over 30 Minutes   Continuous PRN 01/31/23 1058 01/31/23 1058   01/31/23 0000  cefOXitin (MEFOXIN) 2 g in sodium chloride 0.9 % 100 mL IVPB  Status:  Discontinued        2 g 200 mL/hr over 30 Minutes Intravenous To Radiology 01/30/23 1014 01/30/23 1015   01/25/23 1600  ampicillin-sulbactam (UNASYN) 1.5 g in sodium chloride 0.9 % 100 mL IVPB  Status:  Discontinued        1.5 g 200 mL/hr over 30 Minutes Intravenous Every 6 hours 01/25/23 1525 02/01/23 1150         Component Value Date/Time   SDES BLOOD LEFT HAND 02/13/2023 1122   SDES BLOOD RIGHT HAND 02/13/2023 1122   SPECREQUEST  02/13/2023 1122    BOTTLES DRAWN AEROBIC AND ANAEROBIC Blood Culture results may not be optimal due to an inadequate volume of blood received in culture bottles   SPECREQUEST  02/13/2023 1122    BOTTLES DRAWN AEROBIC AND ANAEROBIC Blood Culture results may not be optimal due to an inadequate volume of blood received in culture bottles   CULT  02/13/2023 1122    NO GROWTH 4 DAYS Performed at [facility], [address] [place], [place] [address]    CULT  02/13/2023 1122    NO GROWTH 4 DAYS Performed at [facility], [address] [place], [place] [address]    REPTSTATUS PENDING 02/13/2023 1122   REPTSTATUS PENDING 02/13/2023 1122    Radiology Studies: No results  found.   LOS: 22 days   Total time spent in review of labs and imaging, patient evaluation, formulation of plan, documentation and communication with family: 35 minutes  Lanae Boast, MD [facility]  02/17/2023, 10:29 AM

## 2023-02-17 NOTE — Progress Notes (Addendum)
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 -Intact biliary and Cholecystostomy drains, draining well -Ordered PSA levels, pending at this time -Dr. Irlene Crudup/medical oncology following closely  2.  Macrocytic anemia -Hemoglobin stable 9.9 today -No indication to transfuse at this time -Monitor CBC with differential  3.  Sepsis -Likely due to cholangitis -On antibiotics, continue per orders -Medicine following  4.  Malnutrition -Encouraged to increase oral intake, Ensure -Patient with poor oral intake, continue supplements -Likely does not need a feeding tube at this time.   Code Status DNR-Limited   Discharge planning: TO SNF  Subjective:  Patient is seen today awake and alert, ambulating with walker from bathroom to chair at bedside.  Reports that he feels a little tired from walking however no new complaints.  Patient somewhat conversant upon prompting, states granddaughter came to visit him from [place].  Objective:  Vitals:   02/17/23 0735 02/17/23 1151  BP: 92/62   Pulse: 87 85  Resp: 15 13  Temp: 98.2 F (36.8 C)   SpO2: 98% 97%     Intake/Output Summary (Last 24 hours) at 02/17/2023 1154 Last data filed at 02/17/2023 9562 Gross per 24 hour  Intake 540 ml  Output 2000 ml  Net -1460 ml     REVIEW OF SYSTEMS:   Constitutional: +weak, Denies fevers, chills or abnormal night sweats Eyes: Denies blurriness of vision, double vision or watery eyes Ears, nose, mouth, throat, and face: Denies mucositis or sore throat Respiratory: Denies cough, dyspnea or wheezes Cardiovascular: Denies palpitation, chest discomfort or lower extremity swelling Gastrointestinal:  Denies  nausea, heartburn or change in bowel habits Skin: Denies abnormal skin rashes Lymphatics: Denies new lymphadenopathy or easy bruising Neurological: Denies numbness, tingling or new weaknesses Behavioral/Psych: Mood is stable, no new changes  All other systems were reviewed with the patient and are negative.  PHYSICAL EXAMINATION: ECOG PERFORMANCE STATUS: 3 - Symptomatic, >50% confined to bed  Vitals:   02/17/23 0735 02/17/23 1151  BP: 92/62   Pulse: 87 85  Resp: 15 13  Temp: 98.2 F (36.8 C)   SpO2: 98% 97%   Filed Weights   01/24/23 0953 02/05/23 0223 02/11/23 1055  Weight: 190 lb (86.2 kg) 181 lb 14.1 oz (82.5 kg) 184 lb (83.5 kg)    GENERAL: alert, +weak and frail appearing, no distress and comfortable SKIN: +jaundiced skin color, texture, turgor are normal, no rashes or significant lesions EYES: normal, conjunctiva are pink and non-injected, sclera clear OROPHARYNX: no exudate, no erythema and lips, buccal mucosa, and tongue normal  NECK: supple, thyroid normal size, non-tender, without nodularity LYMPH: no palpable lymphadenopathy in the cervical, axillary or inguinal LUNGS: clear to auscultation and percussion with normal breathing effort HEART: regular rate & rhythm and no murmurs and no lower extremity edema ABDOMEN: abdomen soft, +multiple drains intact  MUSCULOSKELETAL: no cyanosis of digits and no clubbing  PSYCH: alert & oriented x 3 with fluent speech NEURO: no focal motor/sensory deficits   All questions were answered. The patient knows to call the clinic with any problems, questions or concerns.   The total time spent in the appointment was 30 minutes encounter with patient including review of chart and various tests results, discussions about plan of care and  coordination of care plan  Samuel Bills, NP 02/17/2023 11:54 AM    Labs Reviewed:  Lab Results  Component Value Date   WBC 8.8 02/17/2023   HGB 9.9 (L) 02/17/2023   HCT 30.0 (L) 02/17/2023    MCV 106.4 (H) 02/17/2023   PLT 359 02/17/2023   Recent Labs    12/23/22 1055 12/27/22 0946 12/30/22 1600 01/13/23 0901 01/24/23 1020 02/15/23 0203 02/16/23 0213 02/17/23 0205  NA 131* 132*   < > 132*   < > 128* 131* 128*  K 4.4 4.4   < > 4.1   < > 4.8 4.8 4.6  CL 95* 95*   < > 95*   < > 97* 98 94*  CO2 27 29   < > 28   < > 23 25 26   GLUCOSE 111* 116*   < > 134*   < > 109* 124* 122*  BUN 23 31*   < > 20   < > 47* 45* 41*  CREATININE 1.12 1.08   < > 1.09   < > 1.19 1.26* 1.11  CALCIUM 9.1 9.0   < > 8.9   < > 8.5* 8.8* 8.7*  GFRNONAA  --   --    < >  --    < > >60 58* >60  PROT 6.6 6.8   < > 6.1   < > 5.1* 5.2* 5.1*  ALBUMIN 3.6 3.5   < > 3.4*   < > 1.8* 1.8* 1.9*  AST 49* 28   < > 135*   < > 114* 102* 114*  ALT 108* 50   < > 150*   < > 93* 86* 83*  ALKPHOS 303* 362*   < > 694*   < > 277* 265* 253*  BILITOT 0.7 1.0   < > 3.9*   < > 10.1* 9.9* 9.0*  BILIDIR 0.3 0.3  --  2.5*  --   --   --   --    < > = values in this interval not displayed.    Studies Reviewed:  IR EXCHANGE BILIARY DRAIN Result Date: 02/14/2023 INDICATION: Patient initially diagnosed with Mirizzi syndrome, post cholecystostomy tube placement on 01/02/2023. Unfortunately, patient experienced worsening hyperbilirubinemia, ultimately undergoing attempted though unsuccessful ERCP. As such, patient underwent placement of a right-sided biliary drainage catheter on the 01/31/2023 and ultimately placement of a left-sided biliary drainage catheter on 02/07/2023. 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 Since that time, despite cholecystostomy and bilateral biliary drainage catheter placement, the patient's hyperbilirubinemia has persisted. As such, the patient presents today for cholangiogram and potential biliary drainage catheter exchange and up sizing. EXAM: 1. Cholangiogram via existing cholecystostomy tube. CHOLANGIOGRAM VIA EXISTING CHOLECYSTOSTOMY TUBE 2. FLUOROSCOPIC GUIDED  EXCHANGE AND UP SIZING TO NOW 14 FRENCH RIGHT-SIDED BILIARY DRAINAGE CATHETER 3. FLUOROSCOPIC GUIDED EXCHANGE AND UP SIZING TO NOW 12 FRENCH LEFT-SIDED BILIARY DRAINAGE CATHETER COMPARISON:  COMPARISON Image guided left-sided biliary drainage catheter placement and brush biopsy - 02/07/2023 Image guided right-sided biliary drainage catheter placement - 01/31/2023 Cholecystostomy tube exchange - 01/24/2023 Image guided cholecystostomy tube placement - 01/02/2023 CT abdomen and pelvis - 02/06/2023; 02/02/2023 Abdominal MRI - 12/29/2022 CONTRAST:  25 mL Omnipaque-300 administered into the collecting system ANESTHESIA/SEDATION: ANESTHESIA/SEDATION 3 minutes, 6 seconds (117 mGy) FLUOROSCOPY TIME:  3 minutes, 6 seconds (1 image 17 mGy) COMPLICATIONS: None immediate. TECHNIQUE: Informed written consent was obtained from the patient after a discussion of the risks, benefits and alternatives to  treatment. Questions regarding the procedure were encouraged and answered. A timeout was performed prior to the initiation of the procedure. Preprocedural spot fluoroscopic image was obtained of the right upper abdominal quadrant. Cholangiogram were performed via the right and left biliary drainage catheters as well as the cholecystostomy tube. Next, the external portion of the left lower drainage catheter was cut and cannulated with an Amplatz wire which was advanced through the biliary drainage catheter to the level of the duodenum. Under fluoroscopic guided exchange, the pre-existing 10 [place] left-sided biliary drainage catheter was exchanged for a new, slightly larger, now 53 [place] biliary drainage catheter with end coiled and locked within the duodenum. The identical procedure was performed for the right-sided biliary drainage catheter, ultimately with exchange of the pre-existing 12 [place] biliary drainage catheter for a new, slightly larger now 56 [place] biliary drainage catheter with end coiled and locked within the  duodenum. Small amount of contrast was injected via the exchanged biliary drainage catheters and post fluoroscopic and radiographic images were obtained. The biliary drainage catheters were and secured to the skin with interrupted sutures and all drainage catheters were reconnected to gravity bags. Dressings were applied. The patient tolerated the procedure well without immediate postprocedural complication. FINDINGS: Appropriately positioned and functioning cholecystostomy tube. There is persistent occlusion of the cystic duct at its mid/peripheral aspects. Appropriate positioning and functionality of both the right and left-sided biliary drainage catheters with opacification of the intrahepatic biliary system as well as passage of contrast through the catheters to the level of the duodenum. After fluoroscopic guided exchange and up sizing, both biliary drainage catheters are appropriately positioned with end coiled and locked within the duodenum. IMPRESSION: 1. Successful fluoroscopic guided exchange and up sizing of now 68 French right-sided biliary drainage catheter with end coiled and locked within the duodenum. 2. Successful fluoroscopic guided exchange and up sizing of now 20 French left-sided biliary drainage catheter with end coiled and locked within the duodenum. 3. Appropriately positioned and functioning cholecystostomy tube with persistent complete occlusion of the mid/peripheral aspect of the cystic duct. The cholecystostomy tube was not exchanged as it was most recently exchanged on 01/24/2023. PLAN: Recommend obtaining daily CMP. If patient's hyperbilirubinemia immediate does not improve in the coming days, would recommend further evaluation with repeat either contrast-enhanced abdominal CT or MRCP as indicated. Electronically Signed   By: Simonne Come M.D.   On: 02/14/2023 07:15   IR EXCHANGE BILIARY DRAIN Result Date: 02/14/2023 INDICATION: Patient initially diagnosed with Mirizzi syndrome, post  cholecystostomy tube placement on 01/02/2023. Unfortunately, patient experienced worsening hyperbilirubinemia, ultimately undergoing attempted though unsuccessful ERCP. As such, patient underwent placement of a right-sided biliary drainage catheter on the 01/31/2023 and ultimately placement of a left-sided biliary drainage catheter on 02/07/2023. 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 Since that time, despite cholecystostomy and bilateral biliary drainage catheter placement, the patient's hyperbilirubinemia has persisted. As such, the patient presents today for cholangiogram and potential biliary drainage catheter exchange and up sizing. EXAM: 1. Cholangiogram via existing cholecystostomy tube. CHOLANGIOGRAM VIA EXISTING CHOLECYSTOSTOMY TUBE 2. FLUOROSCOPIC GUIDED EXCHANGE AND UP SIZING TO NOW 14 FRENCH RIGHT-SIDED BILIARY DRAINAGE CATHETER 3. FLUOROSCOPIC GUIDED EXCHANGE AND UP SIZING TO NOW 12 FRENCH LEFT-SIDED BILIARY DRAINAGE CATHETER COMPARISON:  COMPARISON Image guided left-sided biliary drainage catheter placement and brush biopsy - 02/07/2023 Image guided right-sided biliary drainage catheter placement - 01/31/2023 Cholecystostomy tube exchange - 01/24/2023 Image guided cholecystostomy tube placement - 01/02/2023 CT abdomen and pelvis - 02/06/2023;  02/02/2023 Abdominal MRI - 12/29/2022 CONTRAST:  25 mL Omnipaque-300 administered into the collecting system ANESTHESIA/SEDATION: ANESTHESIA/SEDATION 3 minutes, 6 seconds (117 mGy) FLUOROSCOPY TIME:  3 minutes, 6 seconds (1 image 17 mGy) COMPLICATIONS: None immediate. TECHNIQUE: Informed written consent was obtained from the patient after a discussion of the risks, benefits and alternatives to treatment. Questions regarding the procedure were encouraged and answered. A timeout was performed prior to the initiation of the procedure. Preprocedural spot fluoroscopic image was obtained of the right upper abdominal quadrant.  Cholangiogram were performed via the right and left biliary drainage catheters as well as the cholecystostomy tube. Next, the external portion of the left lower drainage catheter was cut and cannulated with an Amplatz wire which was advanced through the biliary drainage catheter to the level of the duodenum. Under fluoroscopic guided exchange, the pre-existing 10 [place] left-sided biliary drainage catheter was exchanged for a new, slightly larger, now 57 [place] biliary drainage catheter with end coiled and locked within the duodenum. The identical procedure was performed for the right-sided biliary drainage catheter, ultimately with exchange of the pre-existing 12 [place] biliary drainage catheter for a new, slightly larger now 66 [place] biliary drainage catheter with end coiled and locked within the duodenum. Small amount of contrast was injected via the exchanged biliary drainage catheters and post fluoroscopic and radiographic images were obtained. The biliary drainage catheters were and secured to the skin with interrupted sutures and all drainage catheters were reconnected to gravity bags. Dressings were applied. The patient tolerated the procedure well without immediate postprocedural complication. FINDINGS: Appropriately positioned and functioning cholecystostomy tube. There is persistent occlusion of the cystic duct at its mid/peripheral aspects. Appropriate positioning and functionality of both the right and left-sided biliary drainage catheters with opacification of the intrahepatic biliary system as well as passage of contrast through the catheters to the level of the duodenum. After fluoroscopic guided exchange and up sizing, both biliary drainage catheters are appropriately positioned with end coiled and locked within the duodenum. IMPRESSION: 1. Successful fluoroscopic guided exchange and up sizing of now 65 French right-sided biliary drainage catheter with end coiled and locked within the duodenum. 2.  Successful fluoroscopic guided exchange and up sizing of now 42 French left-sided biliary drainage catheter with end coiled and locked within the duodenum. 3. Appropriately positioned and functioning cholecystostomy tube with persistent complete occlusion of the mid/peripheral aspect of the cystic duct. The cholecystostomy tube was not exchanged as it was most recently exchanged on 01/24/2023. PLAN: Recommend obtaining daily CMP. If patient's hyperbilirubinemia immediate does not improve in the coming days, would recommend further evaluation with repeat either contrast-enhanced abdominal CT or MRCP as indicated. Electronically Signed   By: Simonne Come M.D.   On: 02/14/2023 07:15   IR CHOLANGIOGRAM EXISTING TUBE Result Date: 02/14/2023 INDICATION: Patient initially diagnosed with Mirizzi syndrome, post cholecystostomy tube placement on 01/02/2023. Unfortunately, patient experienced worsening hyperbilirubinemia, ultimately undergoing attempted though unsuccessful ERCP. As such, patient underwent placement of a right-sided biliary drainage catheter on the 01/31/2023 and ultimately placement of a left-sided biliary drainage catheter on 02/07/2023. 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 Since that time, despite cholecystostomy and bilateral biliary drainage catheter placement, the patient's hyperbilirubinemia has persisted. As such, the patient presents today for cholangiogram and potential biliary drainage catheter exchange and up sizing. EXAM: 1. Cholangiogram via existing cholecystostomy tube. CHOLANGIOGRAM VIA EXISTING CHOLECYSTOSTOMY TUBE 2. FLUOROSCOPIC GUIDED EXCHANGE AND UP SIZING TO NOW 14 FRENCH RIGHT-SIDED BILIARY DRAINAGE CATHETER 3.  FLUOROSCOPIC GUIDED EXCHANGE AND UP SIZING TO NOW 12 FRENCH LEFT-SIDED BILIARY DRAINAGE CATHETER COMPARISON:  COMPARISON Image guided left-sided biliary drainage catheter placement and brush biopsy - 02/07/2023 Image guided right-sided  biliary drainage catheter placement - 01/31/2023 Cholecystostomy tube exchange - 01/24/2023 Image guided cholecystostomy tube placement - 01/02/2023 CT abdomen and pelvis - 02/06/2023; 02/02/2023 Abdominal MRI - 12/29/2022 CONTRAST:  25 mL Omnipaque-300 administered into the collecting system ANESTHESIA/SEDATION: ANESTHESIA/SEDATION 3 minutes, 6 seconds (117 mGy) FLUOROSCOPY TIME:  3 minutes, 6 seconds (1 image 17 mGy) COMPLICATIONS: None immediate. TECHNIQUE: Informed written consent was obtained from the patient after a discussion of the risks, benefits and alternatives to treatment. Questions regarding the procedure were encouraged and answered. A timeout was performed prior to the initiation of the procedure. Preprocedural spot fluoroscopic image was obtained of the right upper abdominal quadrant. Cholangiogram were performed via the right and left biliary drainage catheters as well as the cholecystostomy tube. Next, the external portion of the left lower drainage catheter was cut and cannulated with an Amplatz wire which was advanced through the biliary drainage catheter to the level of the duodenum. Under fluoroscopic guided exchange, the pre-existing 10 [place] left-sided biliary drainage catheter was exchanged for a new, slightly larger, now 52 [place] biliary drainage catheter with end coiled and locked within the duodenum. The identical procedure was performed for the right-sided biliary drainage catheter, ultimately with exchange of the pre-existing 12 [place] biliary drainage catheter for a new, slightly larger now 35 [place] biliary drainage catheter with end coiled and locked within the duodenum. Small amount of contrast was injected via the exchanged biliary drainage catheters and post fluoroscopic and radiographic images were obtained. The biliary drainage catheters were and secured to the skin with interrupted sutures and all drainage catheters were reconnected to gravity bags. Dressings were applied.  The patient tolerated the procedure well without immediate postprocedural complication. FINDINGS: Appropriately positioned and functioning cholecystostomy tube. There is persistent occlusion of the cystic duct at its mid/peripheral aspects. Appropriate positioning and functionality of both the right and left-sided biliary drainage catheters with opacification of the intrahepatic biliary system as well as passage of contrast through the catheters to the level of the duodenum. After fluoroscopic guided exchange and up sizing, both biliary drainage catheters are appropriately positioned with end coiled and locked within the duodenum. IMPRESSION: 1. Successful fluoroscopic guided exchange and up sizing of now 23 French right-sided biliary drainage catheter with end coiled and locked within the duodenum. 2. Successful fluoroscopic guided exchange and up sizing of now 58 French left-sided biliary drainage catheter with end coiled and locked within the duodenum. 3. Appropriately positioned and functioning cholecystostomy tube with persistent complete occlusion of the mid/peripheral aspect of the cystic duct. The cholecystostomy tube was not exchanged as it was most recently exchanged on 01/24/2023. PLAN: Recommend obtaining daily CMP. If patient's hyperbilirubinemia immediate does not improve in the coming days, would recommend further evaluation with repeat either contrast-enhanced abdominal CT or MRCP as indicated. Electronically Signed   By: Simonne Come M.D.   On: 02/14/2023 07:15   CT CHEST WO CONTRAST Result Date: 02/13/2023 CLINICAL DATA:  Hepatocellular carcinoma, cholangiocarcinoma, staging evaluation EXAM: CT CHEST WITHOUT CONTRAST TECHNIQUE: Multidetector CT imaging of the chest was performed following the standard protocol without IV contrast. RADIATION DOSE REDUCTION: This exam was performed according to the departmental dose-optimization program which includes automated exposure control, adjustment of the  mA and/or kV according to patient size and/or use of iterative reconstruction technique. COMPARISON:  02/06/2023 FINDINGS: Cardiovascular: Unenhanced imaging of the heart is unremarkable without pericardial effusion. Mitral valve prosthesis. 4.1 cm ascending thoracic aortic aneurysm. Evaluation of the vascular lumen is limited without IV contrast. Atherosclerosis of the aorta and coronary vasculature. Mediastinum/Nodes: No enlarged mediastinal or axillary lymph nodes. Thyroid gland, trachea, and esophagus demonstrate no significant findings. Lungs/Pleura: No acute airspace disease, effusion, or pneumothorax. Linear consolidation within the right lower lobe consistent with subsegmental atelectasis or scarring. Upper Abdomen: Percutaneous biliary drains are identified traversing the left and right lobes of the liver. Downstream extent excluded by slice selection. Percutaneous cholecystostomy tube is seen coiled within the gallbladder fossa. Minimal pneumobilia. Continued edema at the level of porta hepatis which may reflect known cholangiocarcinoma. Musculoskeletal: Large sclerotic lesions are seen within the C7 and T3 vertebral body suspicious for metastatic disease. There are chronic compression deformities of T4, T9, T11, and L1. Reconstructed images demonstrate no additional findings. IMPRESSION: 1. Large sclerotic lesions within the C7 and T3 vertebral bodies consistent with bony metastatic disease. 2. No other signs of intrathoracic metastases. 3. Stable findings within the liver consistent with known cholangiocarcinoma and indwelling biliary and gallbladder drains. Please refer to recent CT abdomen exam for full description of findings. 4. 4.1 cm ascending thoracic aortic aneurysm. Recommend annual imaging followup by CTA or MRA. This recommendation follows 2010 ACCF/AHA/AATS/ACR/ASA/SCA/SCAI/SIR/STS/SVM Guidelines for the Diagnosis and Management of Patients with Thoracic Aortic Disease. Circulation. 2010;  121: Z610-R604. Aortic aneurysm NOS (ICD10-I71.9) 5. Aortic Atherosclerosis (ICD10-I70.0). Coronary artery atherosclerosis. Electronically Signed   By: Sharlet Salina M.D.   On: 02/13/2023 18:57   IR INT EXT BILIARY DRAIN WITH CHOLANGIOGRAM Result Date: 02/07/2023 INDICATION: 77 year old gentleman with history of biliary obstruction underwent percutaneous cholecystostomy placement on 01/02/2023 and replacement on 01/24/2023. Continued symptoms of jaundice and hyperbilirubinemia prompteda MRI on 01/24/2023. Enhancement of the ductal system was suspicious for cholangitis. Right hepatic biliary drain was placed on 01/31/2023. Imaging from [facility] on 01/31/2023 was suspicious for Klatskin tumor with irregular narrowing of central right and left main hepatic ducts. Despite placement of the right hepatic drain, hyperbilirubinemia and jaundice has persisted. He returns today for placement of left hepatic biliary drain as well as brush biopsy of the stenosed segment of the right hepatic duct through a pre-existing access. EXAM: 1. Ultrasound and fluoroscopy guided left hepatic internal external biliary drain placement 2. Fluoroscopy guided exchange of existing right hepatic internal external biliary drain 3. Brush biopsy of common and right hepatic bile ducts 4. Bilateral percutaneous transhepatic cholangiogram MEDICATIONS: Rocephin 2 g IV; The antibiotic was administered within an appropriate time frame prior to the initiation of the procedure. Demerol IV 50 mg ANESTHESIA/SEDATION: Moderate (conscious) sedation was employed during this procedure. A total of Versed 3 mg and Fentanyl 75 mcg was administered intravenously by the radiology nurse. Total intra-service moderate Sedation Time: 56 minutes. The patient's level of consciousness and vital signs were monitored continuously by radiology nursing throughout the procedure under my direct supervision. FLUOROSCOPY: Radiation Exposure Index (as provided by the fluoroscopic  device): 98 mGy Kerma COMPLICATIONS: None immediate. PROCEDURE: Informed written consent was obtained from the patient after a thorough discussion of the procedural risks, benefits and alternatives. All questions were addressed. Maximal Sterile Barrier Technique was utilized including caps, mask, sterile gowns, sterile gloves, sterile drape, hand hygiene and skin antiseptic. A timeout was performed prior to the initiation of the procedure. Patient positioned supine on the procedure table. Epigastric and right upper quadrant abdominal wall skin prepped and draped in  the usual sterile fashion. Sonographic evaluation of the left hepatic lobe showed mildly dilated ducts. Following local lidocaine administration, a left hepatic lobe duct was successfully accessed with a 21 gauge needle. 21 gauge needle was removed over 0.018 inch guidewire. The guidewire was advanced to the level of the common bile duct. Contrast administered through the right hepatic drain confirmed appropriate positioning within the right hepatic bile ducts and doudenum. The right-sided 49 French drain was cut and removed over 0.035 inch guidewire and replaced with an 8 [place] sheath. The 8 French sheath was advanced beyond the site of right hepatic and common bile duct stenosis. Two brush biopsies were performed at the stenotic segments of the common and right main hepatic ducts. Both samples were sent to pathology in sterile saline. New 12 French internal external biliary drain was inserted over the 0.035 inch guidewire. The pigtail was formed within the doudenum. Attention was again turned to the left hepatic lobe. Transitional dilator set was inserted over the 0.018 inch guidewire. Kumpe catheter and stiff glidewire were advanced to the level of the doudenum utilizing fluoroscopic guidance through this access. Kumpe the catheter was removed and a 10 [place] internal external biliary drain was inserted. The distal pigtail was formed within the  doudenum. Contrast administered through both drains confirmed appropriate positioning within the doudenum as well as within the intrahepatic bile ducts. Both drains were secured to skin with silk suture and connected to bag. IMPRESSION: 1. Successful brush biopsy of the stenotic segments of the common bile and right main bile ducts. 2. Successful replacement of 12 French right hepatic internal external biliary drain. 3. Successful insertion of 10 French left hepatic internal external biliary drain. Electronically Signed   By: Acquanetta Belling M.D.   On: 02/07/2023 16:36   IR ENDOLUMINAL BX OF BILIARY TREE Result Date: 02/07/2023 INDICATION: 77 year old gentleman with history of biliary obstruction underwent percutaneous cholecystostomy placement on 01/02/2023 and replacement on 01/24/2023. Continued symptoms of jaundice and hyperbilirubinemia prompteda MRI on 01/24/2023. Enhancement of the ductal system was suspicious for cholangitis. Right hepatic biliary drain was placed on 01/31/2023. 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 Despite placement of the right hepatic drain, hyperbilirubinemia and jaundice has persisted. He returns today for placement of left hepatic biliary drain as well as brush biopsy of the stenosed segment of the right hepatic duct through a pre-existing access. EXAM: 1. Ultrasound and fluoroscopy guided left hepatic internal external biliary drain placement 2. Fluoroscopy guided exchange of existing right hepatic internal external biliary drain 3. Brush biopsy of common and right hepatic bile ducts 4. Bilateral percutaneous transhepatic cholangiogram MEDICATIONS: Rocephin 2 g IV; The antibiotic was administered within an appropriate time frame prior to the initiation of the procedure. Demerol IV 50 mg ANESTHESIA/SEDATION: Moderate (conscious) sedation was employed during this procedure. A total of  Versed 3 mg and Fentanyl 75 mcg was administered intravenously by the radiology nurse. Total intra-service moderate Sedation Time: 56 minutes. The patient's level of consciousness and vital signs were monitored continuously by radiology nursing throughout the procedure under my direct supervision. FLUOROSCOPY: Radiation Exposure Index (as provided by the fluoroscopic device): 98 mGy Kerma COMPLICATIONS: None immediate. PROCEDURE: Informed written consent was obtained from the patient after a thorough discussion of the procedural risks, benefits and alternatives. All questions were addressed. Maximal Sterile Barrier Technique was utilized including caps, mask, sterile gowns, sterile gloves, sterile drape, hand hygiene and  skin antiseptic. A timeout was performed prior to the initiation of the procedure. Patient positioned supine on the procedure table. Epigastric and right upper quadrant abdominal wall skin prepped and draped in the usual sterile fashion. Sonographic evaluation of the left hepatic lobe showed mildly dilated ducts. Following local lidocaine administration, a left hepatic lobe duct was successfully accessed with a 21 gauge needle. 21 gauge needle was removed over 0.018 inch guidewire. The guidewire was advanced to the level of the common bile duct. Contrast administered through the right hepatic drain confirmed appropriate positioning within the right hepatic bile ducts and doudenum. The right-sided 10 French drain was cut and removed over 0.035 inch guidewire and replaced with an 8 [place] sheath. The 8 French sheath was advanced beyond the site of right hepatic and common bile duct stenosis. Two brush biopsies were performed at the stenotic segments of the common and right main hepatic ducts. Both samples were sent to pathology in sterile saline. New 12 French internal external biliary drain was inserted over the 0.035 inch guidewire. The pigtail was formed within the doudenum. Attention was again  turned to the left hepatic lobe. Transitional dilator set was inserted over the 0.018 inch guidewire. Kumpe catheter and stiff glidewire were advanced to the level of the doudenum utilizing fluoroscopic guidance through this access. Kumpe the catheter was removed and a 10 [place] internal external biliary drain was inserted. The distal pigtail was formed within the doudenum. Contrast administered through both drains confirmed appropriate positioning within the doudenum as well as within the intrahepatic bile ducts. Both drains were secured to skin with silk suture and connected to bag. IMPRESSION: 1. Successful brush biopsy of the stenotic segments of the common bile and right main bile ducts. 2. Successful replacement of 12 French right hepatic internal external biliary drain. 3. Successful insertion of 10 French left hepatic internal external biliary drain. Electronically Signed   By: Acquanetta Belling M.D.   On: 02/07/2023 16:36   IR ENDOLUMINAL BX OF BILIARY TREE Result Date: 02/07/2023 INDICATION: 77 year old gentleman with history of biliary obstruction underwent percutaneous cholecystostomy placement on 01/02/2023 and replacement on 01/24/2023. Continued symptoms of jaundice and hyperbilirubinemia prompteda MRI on 01/24/2023. Enhancement of the ductal system was suspicious for cholangitis. Right hepatic biliary drain was placed on 01/31/2023. 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 Despite placement of the right hepatic drain, hyperbilirubinemia and jaundice has persisted. He returns today for placement of left hepatic biliary drain as well as brush biopsy of the stenosed segment of the right hepatic duct through a pre-existing access. EXAM: 1. Ultrasound and fluoroscopy guided left hepatic internal external biliary drain placement 2. Fluoroscopy guided exchange of existing right hepatic internal external biliary  drain 3. Brush biopsy of common and right hepatic bile ducts 4. Bilateral percutaneous transhepatic cholangiogram MEDICATIONS: Rocephin 2 g IV; The antibiotic was administered within an appropriate time frame prior to the initiation of the procedure. Demerol IV 50 mg ANESTHESIA/SEDATION: Moderate (conscious) sedation was employed during this procedure. A total of Versed 3 mg and Fentanyl 75 mcg was administered intravenously by the radiology nurse. Total intra-service moderate Sedation Time: 56 minutes. The patient's level of consciousness and vital signs were monitored continuously by radiology nursing throughout the procedure under my direct supervision. FLUOROSCOPY: Radiation Exposure Index (as provided by the fluoroscopic device): 98 mGy Kerma COMPLICATIONS: None immediate. PROCEDURE: Informed written consent was obtained from the patient after  a thorough discussion of the procedural risks, benefits and alternatives. All questions were addressed. Maximal Sterile Barrier Technique was utilized including caps, mask, sterile gowns, sterile gloves, sterile drape, hand hygiene and skin antiseptic. A timeout was performed prior to the initiation of the procedure. Patient positioned supine on the procedure table. Epigastric and right upper quadrant abdominal wall skin prepped and draped in the usual sterile fashion. Sonographic evaluation of the left hepatic lobe showed mildly dilated ducts. Following local lidocaine administration, a left hepatic lobe duct was successfully accessed with a 21 gauge needle. 21 gauge needle was removed over 0.018 inch guidewire. The guidewire was advanced to the level of the common bile duct. Contrast administered through the right hepatic drain confirmed appropriate positioning within the right hepatic bile ducts and doudenum. The right-sided 25 French drain was cut and removed over 0.035 inch guidewire and replaced with an 8 [place] sheath. The 8 French sheath was advanced beyond the  site of right hepatic and common bile duct stenosis. Two brush biopsies were performed at the stenotic segments of the common and right main hepatic ducts. Both samples were sent to pathology in sterile saline. New 12 French internal external biliary drain was inserted over the 0.035 inch guidewire. The pigtail was formed within the doudenum. Attention was again turned to the left hepatic lobe. Transitional dilator set was inserted over the 0.018 inch guidewire. Kumpe catheter and stiff glidewire were advanced to the level of the doudenum utilizing fluoroscopic guidance through this access. Kumpe the catheter was removed and a 10 [place] internal external biliary drain was inserted. The distal pigtail was formed within the doudenum. Contrast administered through both drains confirmed appropriate positioning within the doudenum as well as within the intrahepatic bile ducts. Both drains were secured to skin with silk suture and connected to bag. IMPRESSION: 1. Successful brush biopsy of the stenotic segments of the common bile and right main bile ducts. 2. Successful replacement of 12 French right hepatic internal external biliary drain. 3. Successful insertion of 10 French left hepatic internal external biliary drain. Electronically Signed   By: Acquanetta Belling M.D.   On: 02/07/2023 16:36   IR EXCHANGE BILIARY DRAIN Result Date: 02/07/2023 INDICATION: 77 year old gentleman with history of biliary obstruction underwent percutaneous cholecystostomy placement on 01/02/2023 and replacement on 01/24/2023. Continued symptoms of jaundice and hyperbilirubinemia prompteda MRI on 01/24/2023. Enhancement of the ductal system was suspicious for cholangitis. Right hepatic biliary drain was placed on 01/31/2023. 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 Despite placement of the right hepatic drain, hyperbilirubinemia and jaundice has  persisted. He returns today for placement of left hepatic biliary drain as well as brush biopsy of the stenosed segment of the right hepatic duct through a pre-existing access. EXAM: 1. Ultrasound and fluoroscopy guided left hepatic internal external biliary drain placement 2. Fluoroscopy guided exchange of existing right hepatic internal external biliary drain 3. Brush biopsy of common and right hepatic bile ducts 4. Bilateral percutaneous transhepatic cholangiogram MEDICATIONS: Rocephin 2 g IV; The antibiotic was administered within an appropriate time frame prior to the initiation of the procedure. Demerol IV 50 mg ANESTHESIA/SEDATION: Moderate (conscious) sedation was employed during this procedure. A total of Versed 3 mg and Fentanyl 75 mcg was administered intravenously by the radiology nurse. Total intra-service moderate Sedation Time: 56 minutes. The patient's level of consciousness and vital signs were monitored continuously by radiology nursing throughout the procedure  under my direct supervision. FLUOROSCOPY: Radiation Exposure Index (as provided by the fluoroscopic device): 98 mGy Kerma COMPLICATIONS: None immediate. PROCEDURE: Informed written consent was obtained from the patient after a thorough discussion of the procedural risks, benefits and alternatives. All questions were addressed. Maximal Sterile Barrier Technique was utilized including caps, mask, sterile gowns, sterile gloves, sterile drape, hand hygiene and skin antiseptic. A timeout was performed prior to the initiation of the procedure. Patient positioned supine on the procedure table. Epigastric and right upper quadrant abdominal wall skin prepped and draped in the usual sterile fashion. Sonographic evaluation of the left hepatic lobe showed mildly dilated ducts. Following local lidocaine administration, a left hepatic lobe duct was successfully accessed with a 21 gauge needle. 21 gauge needle was removed over 0.018 inch guidewire. The  guidewire was advanced to the level of the common bile duct. Contrast administered through the right hepatic drain confirmed appropriate positioning within the right hepatic bile ducts and doudenum. The right-sided 56 French drain was cut and removed over 0.035 inch guidewire and replaced with an 8 [place] sheath. The 8 French sheath was advanced beyond the site of right hepatic and common bile duct stenosis. Two brush biopsies were performed at the stenotic segments of the common and right main hepatic ducts. Both samples were sent to pathology in sterile saline. New 12 French internal external biliary drain was inserted over the 0.035 inch guidewire. The pigtail was formed within the doudenum. Attention was again turned to the left hepatic lobe. Transitional dilator set was inserted over the 0.018 inch guidewire. Kumpe catheter and stiff glidewire were advanced to the level of the doudenum utilizing fluoroscopic guidance through this access. Kumpe the catheter was removed and a 10 [place] internal external biliary drain was inserted. The distal pigtail was formed within the doudenum. Contrast administered through both drains confirmed appropriate positioning within the doudenum as well as within the intrahepatic bile ducts. Both drains were secured to skin with silk suture and connected to bag. IMPRESSION: 1. Successful brush biopsy of the stenotic segments of the common bile and right main bile ducts. 2. Successful replacement of 12 French right hepatic internal external biliary drain. 3. Successful insertion of 10 French left hepatic internal external biliary drain. Electronically Signed   By: Acquanetta Belling M.D.   On: 02/07/2023 16:36   CT ABDOMEN W CONTRAST Result Date: 02/07/2023 CLINICAL DATA:  77 year old male with a history of biliary obstruction. The original ultrasound 12/26/2022 demonstrates evidence of acute cholecystitis, which was present on a follow-up MRI 12/29/2022. Following this a percutaneous  cholecystostomy was placed 01/02/2023 and replaced 01/24/2023. Ongoing symptoms of jaundice and hyperbilirubinemia prompted a second MRI 01/24/2023. MR suggested cholangitis given some enhancement along the ductal system, undrained intrahepatic bile ducts. Internal external biliary drain was placed 01/31/2023. 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 A follow-up CT 01/31/2023 and 02/02/2023 performed, with no radiopaque gallstones accounting for biliary obstruction. The patient has had ongoing hyperbilirubinemia which has plateaued at the level of the proximally 14 and remains symptomatic. He presents for this CT study as a planning study preoperatively 4 left-sided biliary drainage. EXAM: CT ABDOMEN WITH CONTRAST TECHNIQUE: Multidetector CT imaging of the abdomen was performed using the standard protocol following bolus administration of intravenous contrast. RADIATION DOSE REDUCTION: This exam was performed according to the departmental dose-optimization program which includes automated exposure control, adjustment  of the mA and/or kV according to patient size and/or use of iterative reconstruction technique. CONTRAST:  75mL OMNIPAQUE IOHEXOL 350 MG/ML SOLN COMPARISON:  Ultrasound 12/26/2022, MR 12/29/2022, per coli 01/02/2023, MR 01/24/2023, CT 01/31/2023, CT 02/02/2023 PTC and drainage 01/31/2023 FINDINGS: Lower chest: No acute finding of the lower chest. Minimal atelectasis and trace fluid. Hepatobiliary: Left-sided intrahepatic biliary ductal dilatation. There is associated periportal edema. There are a few small mildly dilated intrahepatic ducts of the right liver, particularly of the segment 8. Percutaneous transhepatic internal/external biliary drain via right-sided approach remains adequately position with the radiopaque marker in the bile ducts and the  term in a shin of the drain in the small bowel. Percutaneous cholecystostomy tube in place within the fundus of the gallbladder. No radiopaque stones. There are no radiopaque stones identified along the course of the internal/external biliary drain. Region of hypodensity/hypoenhancement at the confluence of the left and right biliary ducts, at the liver hilum on image 19 of series 3. This region measures 13 mm x 42 mm on the axial images. No cirrhotic changes Pancreas: Attenuation/enhancement of the pancreas within normal limits. Mild ductal dilatation of the pancreatic duct. Spleen: Unremarkable Adrenals/Urinary Tract: - Right adrenal gland:  Unremarkable - Left adrenal gland: Unremarkable. - Right kidney: No hydronephrosis, nephrolithiasis, inflammation, or ureteral dilation. No focal lesion. - Left Kidney: No hydronephrosis, nephrolithiasis, inflammation, or ureteral dilation. No focal lesion. Stomach/Bowel: - Stomach: Small hiatal hernia.  Otherwise unremarkable stomach. 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 Internal/external biliary drain terminates in the duodenum. Visualized small bowel and colon unremarkable with no distension. Vascular/Lymphatic: Calcifications of the abdominal aorta. Mesenteric arteries and renal arteries patent. Other: None Musculoskeletal: Unchanged configuration of the visualized vertebral bodies including compression fracture of L1 favored to be chronic. No bony canal narrowing. IMPRESSION: Right-sided internal/external biliary drain in adequate position across the right-sided biliary ducts and into the duodenum. The majority of the right-sided ductal system is decompressed, with some mild residual ductal dilatation involving segment 8. Persistent left-sided intrahepatic biliary ductal dilatation. Nonspecific focus of low attenuation/hypoenhancement at the confluence of the left and right-sided biliary ducts in the liver hilum. This may  reflect focal edema, however, a Klatskin tumor cannot be excluded as etiology for the ductal dilatation. Percutaneous cholecystostomy in position in the gallbladder. Aortic Atherosclerosis (ICD10-I70.0). Electronically Signed   By: Gilmer Mor D.O.   On: 02/07/2023 11:21   DG CHEST PORT 1 VIEW Result Date: 02/02/2023 CLINICAL DATA:  Fever EXAM: PORTABLE CHEST 1 VIEW COMPARISON:  12/26/2022 x-ray FINDINGS: Status post median sternotomy. Enlarged cardiopericardial silhouette with calcified tortuous aorta. Prosthetic valve. Overlapping cardiac leads. There is some linear opacity at the bases likely scar or atelectasis. No pneumothorax, effusion or edema. Healed right eighth rib fracture. IMPRESSION: Postop chest with enlarged heart and calcified aorta. Basilar atelectasis Electronically Signed   By: Karen Kays M.D.   On: 02/02/2023 16:08   CT ABDOMEN PELVIS WO CONTRAST Result Date: 02/02/2023 CLINICAL DATA:  Anemia. EXAM: CT ABDOMEN AND PELVIS WITHOUT CONTRAST TECHNIQUE: Multidetector CT imaging of the abdomen and pelvis was performed following the standard protocol without IV contrast. RADIATION DOSE REDUCTION: This exam was performed according to the departmental dose-optimization program which includes automated exposure control, adjustment of the mA and/or kV according to patient size and/or use of iterative reconstruction technique. COMPARISON:  January 31, 2023. FINDINGS: Lower chest: Minimal bibasilar subsegmental atelectasis is noted. Hepatobiliary: Percutaneous cholecystostomy tube is again noted with decompression  of the bladder. Stable position of internal external biliary drain with distal tip in duodenum. Stable mild left hepatic biliary dilatation with periportal edema. Pancreas: Unremarkable. No pancreatic ductal dilatation or surrounding inflammatory changes. Spleen: Normal in size without focal abnormality. Adrenals/Urinary Tract: Adrenal glands are unremarkable. Kidneys are normal, without  renal calculi, focal lesion, or hydronephrosis. Bladder is unremarkable. Stomach/Bowel: Stomach is within normal limits. Appendix appears normal. No evidence of bowel wall thickening, distention, or inflammatory changes. Vascular/Lymphatic: Aortic atherosclerosis. No enlarged abdominal or pelvic lymph nodes. Reproductive: Mild prostatic enlargement is noted. Other: No abdominal wall hernia or abnormality. No abdominopelvic ascites. Musculoskeletal: Old L1 and L5 fractures are again noted. No acute abnormality seen. IMPRESSION: Percutaneous cholecystostomy tube is again noted. Stable position of internal external biliary drain with distal tip in duodenum. Stable mild left hepatic biliary dilatation with periportal edema. Electronically Signed   By: Lupita Raider M.D.   On: 02/02/2023 13:43   CT ABDOMEN PELVIS W CONTRAST Result Date: 01/31/2023 CLINICAL DATA:  Gallbladder and biliary cancer. EXAM: CT ABDOMEN AND PELVIS WITH CONTRAST TECHNIQUE: Multidetector CT imaging of the abdomen and pelvis was performed using the standard protocol following bolus administration of intravenous contrast. RADIATION DOSE REDUCTION: This exam was performed according to the departmental dose-optimization program which includes automated exposure control, adjustment of the mA and/or kV according to patient size and/or use of iterative reconstruction technique. CONTRAST:  75mL OMNIPAQUE IOHEXOL 350 MG/ML SOLN COMPARISON:  MRI abdomen 01/24/2023. FINDINGS: Lower chest: There is atelectasis in the lung bases with trace bilateral pleural effusions. Hepatobiliary: Transhepatic percutaneous biliary catheter present with distal tip in the duodenal. There is mild intrahepatic biliary ductal dilatation in the left lobe of the liver with some periportal edema similar to prior MRI given differences in technique. There is a small amount of pneumobilia and contrast throughout the biliary tree likely related to recent procedure. No focal liver  lesions are seen. Percutaneous cholecystostomy tube present. There some contrast in the gallbladder. The gallbladder is decompressed and there is diffuse wall thickening similar to prior. Pancreas: Unremarkable. No pancreatic ductal dilatation or surrounding inflammatory changes. Spleen: Normal in size without focal abnormality. Adrenals/Urinary Tract: Adrenal glands are unremarkable. Kidneys are normal, without renal calculi, focal lesion, or hydronephrosis. Bladder is unremarkable. Stomach/Bowel: Stomach is within normal limits. Appendix appears normal. No evidence of bowel wall thickening, distention, or inflammatory changes. There is sigmoid colon diverticulosis. Vascular/Lymphatic: No significant vascular findings are present. No enlarged abdominal or pelvic lymph nodes. Reproductive: Prostate gland is enlarged. Other: There is presacral edema. There is trace free fluid in the right upper quadrant similar to prior. There is no focal abdominal wall hernia. Musculoskeletal: There are compression deformities of T8, T9, T11, L1 and L5 which are favored as chronic, but age indeterminate. The bones are diffusely osteopenic. There is mild body wall edema. IMPRESSION: 1. Transhepatic percutaneous biliary catheter present with distal tip in the duodenum. 2. Percutaneous cholecystostomy tube present. Gallbladder is decompressed with diffuse wall thickening similar to prior. 3. Mild intrahepatic biliary ductal dilatation in the left lobe of the liver with periportal edema similar to prior MRI. 4. Trace free fluid in the right upper quadrant similar to prior. 5. Trace bilateral pleural effusions. 6. Mild body wall edema. 7. Multiple compression deformities of the thoracic and lumbar spine are favored as chronic, but age indeterminate. Correlate clinically. Electronically Signed   By: Darliss Cheney M.D.   On: 01/31/2023 20:37   IR BILIARY DRAIN PLACEMENT WITH  CHOLANGIOGRAM Result Date: 01/31/2023 INDICATION: 77 year old  male with biliary obstruction, presents for percutaneous transhepatic cholangiogram and internal/external drain placement EXAM: IMAGE GUIDED PERCUTANEOUS TRANSHEPATIC CHOLANGIOGRAM INTERNAL/EXTERNAL BILIARY DRAIN PLACEMENT MEDICATIONS: 2 g Mefoxin; The antibiotic was administered within an appropriate time frame prior to the initiation of the procedure. ANESTHESIA/SEDATION: Moderate (conscious) sedation was employed during this procedure. A total of Versed 2 mg and Fentanyl 150 mcg was administered intravenously by the radiology nurse. Total intra-service moderate Sedation Time: 43 minutes. The patient's level of consciousness and vital signs were monitored continuously by radiology nursing throughout the procedure under my direct supervision. FLUOROSCOPY: Radiation Exposure Index (as provided by the fluoroscopic device): 282 mGy Kerma COMPLICATIONS: None PROCEDURE: The procedure, risks, benefits, and alternatives were explained to the patient and the patient's family. A complete informed consent was performed, with risk benefit analysis. Specific risks that were discussed for the procedure include bleeding, infection, biliary sepsis, IC use day, organ injury, need for further procedure, need for further surgery, long-term drain placement, cardiopulmonary collapse, death. Questions regarding the procedure were encouraged and answered. The patient understands and consents to the procedure. Patient is position in supine position on the fluoroscopy table, and the upper abdomen was prepped and draped in the usual sterile fashion. Maximum barrier sterile technique with sterile gowns and gloves were used for the procedure. A timeout was performed prior to the initiation of the procedure. Local anesthesia was provided with 1% lidocaine with epinephrine. Ultrasound survey of the left liver lobe was performed, with then ultrasound of the right liver lobe. We elected to access transhepatic biliary system via the right. 1%  lidocaine was used for local anesthesia, with generous infiltration of the skin and subcutaneous tissues in and inter left costal location. A Chiba needle was advanced under ultrasound guidance into the right liver lobe, targeting biliary system. Once the tip of the needle was confirmed within the biliary system by injecting small aliquots of contrast, images were stored of the biliary system after partially opacifying the biliary tree via the needle. An 018 wire was then advanced centrally. The needle was removed, a small incision was made with an 11 blade scalpel, and then a triaxial Bard system was advanced into the biliary system. The metal stiffener and dilator were removed, we confirmed placement with contrast infusion. We then attempted to navigate through stenotic ductal system at the hilum, presumably the common hepatic duct. A coaxial Glidewire and 4 French glide cath were then used to attempt to navigate across the obstruction at the hilum of the liver. A combination of the glide cath, Glidewire, and a roadrunner wire were required to access the common hepatic duct. Once the catheter and wire were advanced into the external hepatic ducts, further images were acquired. This demonstrated that the duodenal diverticulum was distorting the anatomy at the ampulla. After some initial attempts fail to cross the ampulla, a coon's wire was placed and then a 6 French 35 cm straight sheath was placed into the common bile duct. At this point we were able to navigate the angled diagnostic catheter and the Glidewire through the ampulla into the duodenum. With the diagnostic catheter in the second portion the duodenum, the coon's wire was then placed. Twelve French dilation of the subcutaneous tissue tracks was performed, and then a 59 [place] biliary drain was placed as an internal/external biliary drain. Small amount of contrast confirmed location. The patient tolerated the procedure well and remained hemodynamically  stable throughout. No complications were encountered and no  significant blood loss was encountered. FINDINGS: Percutaneous transhepatic cholangiogram demonstrates significant stenotic narrowing in the bile ducts at the hilum of the liver, suspicious for malignancy/[place] tumor IMPRESSION: Status post image guided percutaneous transhepatic cholangiogram with internal/external biliary drain placement. Injection of the percutaneous cholecystostomy tube demonstrates that the cystic duct remains occluded. Signed, Yvone Neu. Miachel Roux, RPVI [facility] Radiology Electronically Signed   By: Gilmer Mor D.O.   On: 01/31/2023 16:46   DG C-Arm 1-60 Min-No Report Result Date: 01/29/2023 Fluoroscopy was utilized by the requesting physician.  No radiographic interpretation.   MR ABDOMEN MRCP W WO CONTAST Result Date: 01/24/2023 CLINICAL DATA:  Jaundice. History of acute cholecystitis status post percutaneous cholecystostomy tube placement 01/02/2023 EXAM: MRI ABDOMEN WITHOUT AND WITH CONTRAST (INCLUDING MRCP) TECHNIQUE: Multiplanar multisequence MR imaging of the abdomen was performed both before and after the administration of intravenous contrast. Heavily T2-weighted images of the biliary and pancreatic ducts were obtained. Post-processing was applied at the acquisition scanner with concurrent physician supervision which includes 3D reconstructions, MIPs, volume rendered images and/or shaded surface rendering. CONTRAST:  8.80mL GADAVIST GADOBUTROL 1 MMOL/ML IV SOLN COMPARISON:  MR abdomen dated 12/29/2022 FINDINGS: Decreased sensitivity and specificity for detailed findings due to motion artifact. Lower chest: Trace bilateral pleural effusions. Hepatobiliary: Parenchymal signal abnormality. Scattered subcentimeter T2 hyperintense foci, likely cysts. Increased mild to moderate intrahepatic bile duct dilation more prominent in the left hepatic lobe with new mural  thickening of the extrahepatic bile duct (4:16) and enhancement (1304:45). The common bile duct is nondilated. Gallbladder is decompressed with catheter in-situ and demonstrates circumferential mural thickening. Pancreas: No mass or inflammatory changes. 3 mm T2 hyperintense cystic focus arises directly from the main pancreatic duct in the pancreatic neck (6:20) keeping with side branch intraductal papillary mucinous neoplasms (IPMN). No main ductal dilation, mass lesion, or abnormal enhancement. Spleen:  Within normal limits in size and appearance. Adrenals/Urinary Tract: No adrenal nodules. No suspicious renal masses identified. No evidence of hydronephrosis. Stomach/Bowel: Small proximal duodenal diverticulum. Colonic diverticulosis without acute diverticulitis. Vascular/Lymphatic: No pathologically enlarged lymph nodes identified. No abdominal aortic aneurysm demonstrated. Other:  Trace ascites. Musculoskeletal: No suspicious bone lesions identified. Susceptibility artifact related to median sternotomy wires. IMPRESSION: 1. Increased mild to moderate intrahepatic bile duct dilation, more prominent in the left hepatic lobe, with new mural thickening and enhancement of the extrahepatic bile duct, suspicious for cholangitis. 2. Decompressed gallbladder with catheter in-situ and circumferential mural thickening. 3. Trace bilateral pleural effusions and trace ascites. Electronically Signed   By: Agustin Cree M.D.   On: 01/24/2023 15:54   MR 3D Recon At Scanner Result Date: 01/24/2023 CLINICAL DATA:  Jaundice. History of acute cholecystitis status post percutaneous cholecystostomy tube placement 01/02/2023 EXAM: MRI ABDOMEN WITHOUT AND WITH CONTRAST (INCLUDING MRCP) TECHNIQUE: Multiplanar multisequence MR imaging of the abdomen was performed both before and after the administration of intravenous contrast. Heavily T2-weighted images of the biliary and pancreatic ducts were obtained. Post-processing was applied at the  acquisition scanner with concurrent physician supervision which includes 3D reconstructions, MIPs, volume rendered images and/or shaded surface rendering. CONTRAST:  8.86mL GADAVIST GADOBUTROL 1 MMOL/ML IV SOLN COMPARISON:  MR abdomen dated 12/29/2022 FINDINGS: Decreased sensitivity and specificity for detailed findings due to motion artifact. Lower chest: Trace bilateral pleural effusions. Hepatobiliary: Parenchymal signal abnormality. Scattered subcentimeter T2 hyperintense foci, likely cysts. Increased mild to moderate intrahepatic bile duct dilation more prominent in the left hepatic lobe with new mural thickening of the  extrahepatic bile duct (4:16) and enhancement (1304:45). The common bile duct is nondilated. Gallbladder is decompressed with catheter in-situ and demonstrates circumferential mural thickening. Pancreas: No mass or inflammatory changes. 3 mm T2 hyperintense cystic focus arises directly from the main pancreatic duct in the pancreatic neck (6:20) keeping with side branch intraductal papillary mucinous neoplasms (IPMN). No main ductal dilation, mass lesion, or abnormal enhancement. Spleen:  Within normal limits in size and appearance. Adrenals/Urinary Tract: No adrenal nodules. No suspicious renal masses identified. No evidence of hydronephrosis. Stomach/Bowel: Small proximal duodenal diverticulum. Colonic diverticulosis without acute diverticulitis. Vascular/Lymphatic: No pathologically enlarged lymph nodes identified. No abdominal aortic aneurysm demonstrated. Other:  Trace ascites. Musculoskeletal: No suspicious bone lesions identified. Susceptibility artifact related to median sternotomy wires. IMPRESSION: 1. Increased mild to moderate intrahepatic bile duct dilation, more prominent in the left hepatic lobe, with new mural thickening and enhancement of the extrahepatic bile duct, suspicious for cholangitis. 2. Decompressed gallbladder with catheter in-situ and circumferential mural thickening.  3. Trace bilateral pleural effusions and trace ascites. Electronically Signed   By: Agustin Cree M.D.   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 Patient now with worsening hyperbilirubinemia and jaundice and as such presents for cholangiogram and potential cholecystostomy tube exchange. EXAM: FLUOROSCOPIC GUIDED CHOLECYSTOSTOMY TUBE EXCHANGE COMPARISON:  MRCP-12/29/2022 Image guided cholecystostomy tube placement-01/02/2023 MEDICATIONS: None ANESTHESIA/SEDATION: None CONTRAST:  15mL OMNIPAQUE IOHEXOL 300 MG/ML SOLN - administered into the gallbladder lumen. FLUOROSCOPY TIME:  1 minute, 6 seconds (6.1 mGy) COMPLICATIONS: None immediate. PROCEDURE: The patient was positioned supine on the fluoroscopy table. The external portion of the existing cholecystostomy tube as well as the surrounding skin was prepped and draped in usual sterile fashion. A time-out was performed prior to the initiation of the procedure. A preprocedural spot fluoroscopic image was obtained of the right upper abdominal quadrant existing cholecystostomy tube. The skin surrounding the cholecystostomy tube was anesthetized with 1% lidocaine with epinephrine. The external portion of the cholecystostomy tube was cut and cannulated with a short Amplatz wire which was advanced through the tube and coiled within the gallbladder lumen Next, under intermittent fluoroscopic guidance, the existing 10 [place] cholecystostomy tube was exchanged for a new, slightly larger now 10 [place] cholecystostomy tube which was repositioned into the more central aspect of the gallbladder lumen. Contrast injection confirms appropriate positioning and functionality of the cholecystomy tube. The cholecystostomy tube was flushed with a small amount of saline and reconnected to a gravity bag.  The cholecystostomy tube was secured with an interrupted suture and a Stat Lock device. A dressing was applied. The patient tolerated the procedure well without immediate postprocedural complication. FINDINGS: Preprocedural spot fluoroscopic image demonstrates unchanged positioning of cholecystostomy tube with end coiled and locked over the expected location of the fundus of the gallbladder. Contrast injection demonstrates appropriate positioning and functionality of the existing cholecystostomy tube. There is opacification of the peripheral aspect of the cystic duct without passage of contrast to the level of the CBD. After fluoroscopic guided exchange, the new, 10 [place] cholecystostomy tube is appropriately positioned within the gallbladder lumen. Post exchange cholangiogram demonstrates appropriate positioning and functionality of the new cholecystostomy tube. IMPRESSION: Successful fluoroscopic guided exchange of 10 French cholecystostomy tube. PLAN: - Cholangiogram demonstrates persistent occlusion of the cystic duct without opacification of the CBD. 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 The patient is visibly jaundiced with worsening hyperbilirubinemia (bilirubin - 3.9 on 11/11, 0.6 on the day of cholecystostomy tube placement) and as such the decision was made to escort the patient to the emergency department for further evaluation and management as the patient will likely require an ERCP to relieve his worsening biliary obstruction. Electronically Signed   By: Simonne Come M.D.   On: 01/24/2023 11:06   Addendum I have seen the patient, examined him. I agree with the assessment and and plan and have edited the notes.   Patient's bilirubin is trending down slowly, T. bili 9.0 today.  He is still very fatigued, with low appetite, only able to walk for short distance with physical therapist.  He has been in bed for  most of time.  I again met his wife, son and daughter at bedside today, and answered to their many questions to their satisfaction.  I again recommend PET scan after hospital discharge, then decide if he needs bone biopsy based on PET.  If he has hypermetabolic bone lesions on PET scan, I do not feel strongly he needs a bone biopsy.  If he does have bone metastasis, then he is cancer is not curable, and treatment would be chemotherapy and immunotherapy if he is a candidate.  I will follow him in office after his PET scan.  I encouraged him to participate in physical therapy, and try to eat more, increase nutritional supplement, to see if he can get stronger.  Given his advanced age, low performance status, his treatment options is very limited.  Overall prognosis is poor.  Patient and his family understand the above.  Discharge plan per primary team, I do not have additional workup before his discharge. I spent a total of 50 minutes close visit today, more than 50% time on face-to-face counseling. Malachy Mood MD 02/17/2023

## 2023-02-17 NOTE — Progress Notes (Signed)
Chief Complaint: Biliary obstruction  Referring Physician: Dr. Mosetta Putt  Supervising Physician: Malachy Moan  Patient Status: [facility] - In-pt  History of Present Illness: Samuel Mayo is a 77 y.o. male with history of a fib, HTN, MVR, cholecystitis with abscess. Initial cholecystostomy drain was placed 10/31 and exchanged 11/22. Patient was admitted 11/22 for obstruction jaundice 11/22. Biliary drain placed 11/29 and another biliary drain placed 12/6. Upsize to 14 Fr of the right biliary drain was performed 02/13/23.   02/17/23: Patient is sitting upright in bed. No adverse events occurred overnight. Patient denies drain complications or concerns. Patient denies abdominal pain or drain site pain/redness.   Past Medical History:  Diagnosis Date   AK (actinic keratosis)    Allergy    Chicken pox    Compression fracture of L1 lumbar vertebra (HCC)    Compression fracture of thoracic vertebra (HCC)    Hypertension    Lentigines    Follows with DR Culton (derma)   Seizures (HCC)     Past Surgical History:  Procedure Laterality Date   CARDIOVERSION N/A 05/14/2021   Procedure: CARDIOVERSION;  Surgeon: Little Ishikawa, MD;  Location: [facility] ENDOSCOPY;  Service: Cardiovascular;  Laterality: N/A;   CARDIOVERSION N/A 10/08/2021   Procedure: CARDIOVERSION;  Surgeon: Christell Constant, MD;  Location: [facility] ENDOSCOPY;  Service: Cardiovascular;  Laterality: N/A;   COLONOSCOPY     ESOPHAGOGASTRODUODENOSCOPY N/A 01/29/2023   Procedure: ESOPHAGOGASTRODUODENOSCOPY (EGD);  Surgeon: Iva Boop, MD;  Location: [facility] ENDOSCOPY;  Service: Gastroenterology;  Laterality: N/A;   IR BILIARY DRAIN PLACEMENT WITH CHOLANGIOGRAM  01/31/2023   IR CHOLANGIOGRAM EXISTING TUBE  02/13/2023   IR ENDOLUMINAL BX OF BILIARY TREE  02/07/2023   IR ENDOLUMINAL BX OF BILIARY TREE  02/07/2023   IR EXCHANGE BILIARY DRAIN  01/24/2023   IR EXCHANGE BILIARY DRAIN  02/07/2023   IR EXCHANGE BILIARY DRAIN   02/13/2023   IR EXCHANGE BILIARY DRAIN  02/13/2023   IR INT EXT BILIARY DRAIN WITH CHOLANGIOGRAM  02/07/2023   IR PERC CHOLECYSTOSTOMY  01/02/2023   mitral vavle     Congetinal mitral valve disease.    Allergies: Other  Medications: Prior to Admission medications   Medication Sig Start Date End Date Taking? Authorizing Provider  amLODipine (NORVASC) 5 MG tablet TAKE 1 TABLET (5 MG TOTAL) BY MOUTH DAILY. 01/11/22  Yes [name], [name], MD  Cholecalciferol (VITAMIN D-3 PO) Take 1 capsule by mouth every evening.   Yes [provider]  divalproex (DEPAKOTE) 250 MG DR tablet Take 1 tablet (250 mg total) by mouth 2 (two) times daily. Take in addition to 500 mg twice a day (pt should have a total of 750 mg twice a day) 08/26/22  Yes Micki Riley, MD  divalproex (DEPAKOTE) 500 MG DR tablet Take 1 tablet (500 mg total) by mouth 2 (two) times daily. 05/20/22  Yes Micki Riley, MD  ELIQUIS 5 MG TABS tablet TAKE 1 TABLET BY MOUTH TWICE A DAY 08/07/22  Yes Mealor, Roberts Gaudy, MD  gabapentin (NEURONTIN) 300 MG capsule Take 1 capsule (300 mg total) by mouth at bedtime. 09/16/22  Yes Micki Riley, MD  ibandronate (BONIVA) 150 MG tablet TAKE 1 TABLET BY MOUTH EVERY 30 DAYS. TAKE IN AM WITH FULL GLASS OF WATER ON EMPTY STOMACH AND DON'T TAKE ANYTHING ELSE BY MOUTH OR LIE DOWN FOR THE NEXT 30 MINUTES Patient taking differently: Take 150 mg by mouth See admin instructions. Take 1 tablet (150mg ) once a  month around the 20th. 07/22/22  Yes [name], [name], MD  LUTEIN PO Take 1 tablet by mouth every evening.   Yes [provider]  metoprolol succinate (TOPROL-XL) 25 MG 24 hr tablet TAKE 1 TABLET (25 MG TOTAL) BY MOUTH DAILY. 09/23/22  Yes BranchAlben Spittle, MD  Multiple Vitamin (MULTIVITAMIN WITH MINERALS) TABS tablet Take 1 tablet by mouth every evening.   Yes [provider]  OVER THE COUNTER MEDICATION Take 1 tablet by mouth 2 (two) times daily. Bone Up   Yes [provider]   amoxicillin-clavulanate (AUGMENTIN) 875-125 MG tablet Take 1 tablet by mouth 2 (two) times daily. Patient not taking: Reported on 01/24/2023    [provider]     Family History  Problem Relation Age of Onset   Heart disease Mother    Cancer Father    Heart disease Father    Prostate cancer Father    Aneurysm Paternal Grandfather    Heart disease Son        congenital mitral valve dz   Colon cancer Neg Hx    Esophageal cancer Neg Hx    Stomach cancer Neg Hx    Rectal cancer Neg Hx     Social History   Socioeconomic History   Marital status: Domestic Partner    Spouse name: [name]   Number of children: Not on file   Years of education: Not on file   Highest education level: Bachelor's degree (e.g., BA, AB, BS)  Occupational History   Not on file  Tobacco Use   Smoking status: Never   Smokeless tobacco: Never   Tobacco comments:    Never smoke 10/29/21   Vaping Use   Vaping status: Never Used  Substance and Sexual Activity   Alcohol use: Yes    Alcohol/week: 14.0 standard drinks of alcohol    Types: 14 Standard drinks or equivalent per week    Comment: 2 glasses of wine nightly 10/29/21   Drug use: No   Sexual activity: Not on file  Other Topics Concern   Not on file  Social History Narrative   Lives with wife [name]   R handed   Caffeine: 3 C of coffee AM   Social Drivers of Health   Financial Resource Strain: Low Risk  (10/21/2022)   Overall Financial Resource Strain (CARDIA)    Difficulty of Paying Living Expenses: Not hard at all  Food Insecurity: No Food Insecurity (01/25/2023)   Hunger Vital Sign    Worried About Running Out of Food in the Last Year: Never true    Ran Out of Food in the Last Year: Never true  Transportation Needs: No Transportation Needs (01/25/2023)   PRAPARE - Administrator, Civil Service (Medical): No    Lack of Transportation (Non-Medical): No  Physical Activity: Insufficiently Active (10/21/2022)   Exercise  Vital Sign    Days of Exercise per Week: 3 days    Minutes of Exercise per Session: 40 min  Stress: Stress Concern Present (10/21/2022)   [employer] of Occupational Health - Occupational Stress Questionnaire    Feeling of Stress : Rather much  Social Connections: Moderately Integrated (10/21/2022)   Social Connection and Isolation Panel [NHANES]    Frequency of Communication with Friends and Family: Three times a week    Frequency of Social Gatherings with Friends and Family: Once a week    Attends Religious Services: Never    Database administrator or Organizations: No  Attends Banker Meetings: More than 4 times per year    Marital Status: Living with partner    Review of Systems: A 12 point ROS discussed and pertinent positives are indicated in the HPI above.  All other systems are negative.  Review of Systems  Constitutional:  Negative for fever.  Gastrointestinal:  Negative for abdominal pain.  Skin:  Negative for color change.  Psychiatric/Behavioral:  Negative for confusion.     Vital Signs: BP 92/62 (BP Location: Left Arm)   Pulse 87   Temp 98.2 F (36.8 C) (Oral)   Resp 15   Ht 5\' 11"  (1.803 m)   Wt 184 lb (83.5 kg)   SpO2 98%   BMI 25.66 kg/m      Physical Exam HENT:     Head: Normocephalic.  Pulmonary:     Effort: Pulmonary effort is normal.  Abdominal:     Palpations: Abdomen is soft.     Comments: Two drains in place in the right abdomen. One drain in place in the left abdomen. All drains are attached to gravity bags. Perc drain in the RUQ with clear fluid. Biliary drain in the RUQ with yellow/green fluid and left biliary drain with dark green fluid.   Skin:    Coloration: Skin is jaundiced.  Neurological:     Mental Status: He is alert and oriented to person, place, and time.  Psychiatric:        Thought Content: Thought content normal.        Judgment: Judgment normal.     Imaging: IR EXCHANGE BILIARY DRAIN Result Date:  02/14/2023 INDICATION: Patient initially diagnosed with Mirizzi syndrome, post cholecystostomy tube placement on 01/02/2023. Unfortunately, patient experienced worsening hyperbilirubinemia, ultimately undergoing attempted though unsuccessful ERCP. As such, patient underwent placement of a right-sided biliary drainage catheter on the 01/31/2023 and ultimately placement of a left-sided biliary drainage catheter on 02/07/2023. 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 Since that time, despite cholecystostomy and bilateral biliary drainage catheter placement, the patient's hyperbilirubinemia has persisted. As such, the patient presents today for cholangiogram and potential biliary drainage catheter exchange and up sizing. EXAM: 1. Cholangiogram via existing cholecystostomy tube. CHOLANGIOGRAM VIA EXISTING CHOLECYSTOSTOMY TUBE 2. FLUOROSCOPIC GUIDED EXCHANGE AND UP SIZING TO NOW 14 FRENCH RIGHT-SIDED BILIARY DRAINAGE CATHETER 3. FLUOROSCOPIC GUIDED EXCHANGE AND UP SIZING TO NOW 12 FRENCH LEFT-SIDED BILIARY DRAINAGE CATHETER COMPARISON:  COMPARISON Image guided left-sided biliary drainage catheter placement and brush biopsy - 02/07/2023 Image guided right-sided biliary drainage catheter placement - 01/31/2023 Cholecystostomy tube exchange - 01/24/2023 Image guided cholecystostomy tube placement - 01/02/2023 CT abdomen and pelvis - 02/06/2023; 02/02/2023 Abdominal MRI - 12/29/2022 CONTRAST:  25 mL Omnipaque-300 administered into the collecting system ANESTHESIA/SEDATION: ANESTHESIA/SEDATION 3 minutes, 6 seconds (117 mGy) FLUOROSCOPY TIME:  3 minutes, 6 seconds (1 image 17 mGy) COMPLICATIONS: None immediate. TECHNIQUE: Informed written consent was obtained from the patient after a discussion of the risks, benefits and alternatives to treatment. Questions regarding the procedure were encouraged and answered. A timeout was performed prior to the initiation of the procedure. Preprocedural  spot fluoroscopic image was obtained of the right upper abdominal quadrant. Cholangiogram were performed via the right and left biliary drainage catheters as well as the cholecystostomy tube. Next, the external portion of the left lower drainage catheter was cut and cannulated with an Amplatz wire which was advanced through the biliary drainage catheter to the level of the duodenum. Under fluoroscopic guided exchange, the pre-existing 10  [place] left-sided biliary drainage catheter was exchanged for a new, slightly larger, now 70 [place] biliary drainage catheter with end coiled and locked within the duodenum. The identical procedure was performed for the right-sided biliary drainage catheter, ultimately with exchange of the pre-existing 12 [place] biliary drainage catheter for a new, slightly larger now 90 [place] biliary drainage catheter with end coiled and locked within the duodenum. Small amount of contrast was injected via the exchanged biliary drainage catheters and post fluoroscopic and radiographic images were obtained. The biliary drainage catheters were and secured to the skin with interrupted sutures and all drainage catheters were reconnected to gravity bags. Dressings were applied. The patient tolerated the procedure well without immediate postprocedural complication. FINDINGS: Appropriately positioned and functioning cholecystostomy tube. There is persistent occlusion of the cystic duct at its mid/peripheral aspects. Appropriate positioning and functionality of both the right and left-sided biliary drainage catheters with opacification of the intrahepatic biliary system as well as passage of contrast through the catheters to the level of the duodenum. After fluoroscopic guided exchange and up sizing, both biliary drainage catheters are appropriately positioned with end coiled and locked within the duodenum. IMPRESSION: 1. Successful fluoroscopic guided exchange and up sizing of now 37 French right-sided  biliary drainage catheter with end coiled and locked within the duodenum. 2. Successful fluoroscopic guided exchange and up sizing of now 57 French left-sided biliary drainage catheter with end coiled and locked within the duodenum. 3. Appropriately positioned and functioning cholecystostomy tube with persistent complete occlusion of the mid/peripheral aspect of the cystic duct. The cholecystostomy tube was not exchanged as it was most recently exchanged on 01/24/2023. PLAN: Recommend obtaining daily CMP. If patient's hyperbilirubinemia immediate does not improve in the coming days, would recommend further evaluation with repeat either contrast-enhanced abdominal CT or MRCP as indicated. Electronically Signed   By: Simonne Come M.D.   On: 02/14/2023 07:15   IR EXCHANGE BILIARY DRAIN Result Date: 02/14/2023 INDICATION: Patient initially diagnosed with Mirizzi syndrome, post cholecystostomy tube placement on 01/02/2023. Unfortunately, patient experienced worsening hyperbilirubinemia, ultimately undergoing attempted though unsuccessful ERCP. As such, patient underwent placement of a right-sided biliary drainage catheter on the 01/31/2023 and ultimately placement of a left-sided biliary drainage catheter on 02/07/2023. 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 Since that time, despite cholecystostomy and bilateral biliary drainage catheter placement, the patient's hyperbilirubinemia has persisted. As such, the patient presents today for cholangiogram and potential biliary drainage catheter exchange and up sizing. EXAM: 1. Cholangiogram via existing cholecystostomy tube. CHOLANGIOGRAM VIA EXISTING CHOLECYSTOSTOMY TUBE 2. FLUOROSCOPIC GUIDED EXCHANGE AND UP SIZING TO NOW 14 FRENCH RIGHT-SIDED BILIARY DRAINAGE CATHETER 3. FLUOROSCOPIC GUIDED EXCHANGE AND UP SIZING TO NOW 12 FRENCH LEFT-SIDED BILIARY DRAINAGE CATHETER COMPARISON:  COMPARISON Image guided left-sided biliary drainage  catheter placement and brush biopsy - 02/07/2023 Image guided right-sided biliary drainage catheter placement - 01/31/2023 Cholecystostomy tube exchange - 01/24/2023 Image guided cholecystostomy tube placement - 01/02/2023 CT abdomen and pelvis - 02/06/2023; 02/02/2023 Abdominal MRI - 12/29/2022 CONTRAST:  25 mL Omnipaque-300 administered into the collecting system ANESTHESIA/SEDATION: ANESTHESIA/SEDATION 3 minutes, 6 seconds (117 mGy) FLUOROSCOPY TIME:  3 minutes, 6 seconds (1 image 17 mGy) COMPLICATIONS: None immediate. TECHNIQUE: Informed written consent was obtained from the patient after a discussion of the risks, benefits and alternatives to treatment. Questions regarding the procedure were encouraged and answered. A timeout was performed prior to the initiation of the procedure. Preprocedural spot fluoroscopic image was obtained of the right upper abdominal quadrant.  Cholangiogram were performed via the right and left biliary drainage catheters as well as the cholecystostomy tube. Next, the external portion of the left lower drainage catheter was cut and cannulated with an Amplatz wire which was advanced through the biliary drainage catheter to the level of the duodenum. Under fluoroscopic guided exchange, the pre-existing 10 [place] left-sided biliary drainage catheter was exchanged for a new, slightly larger, now 33 [place] biliary drainage catheter with end coiled and locked within the duodenum. The identical procedure was performed for the right-sided biliary drainage catheter, ultimately with exchange of the pre-existing 12 [place] biliary drainage catheter for a new, slightly larger now 27 [place] biliary drainage catheter with end coiled and locked within the duodenum. Small amount of contrast was injected via the exchanged biliary drainage catheters and post fluoroscopic and radiographic images were obtained. The biliary drainage catheters were and secured to the skin with interrupted sutures and all  drainage catheters were reconnected to gravity bags. Dressings were applied. The patient tolerated the procedure well without immediate postprocedural complication. FINDINGS: Appropriately positioned and functioning cholecystostomy tube. There is persistent occlusion of the cystic duct at its mid/peripheral aspects. Appropriate positioning and functionality of both the right and left-sided biliary drainage catheters with opacification of the intrahepatic biliary system as well as passage of contrast through the catheters to the level of the duodenum. After fluoroscopic guided exchange and up sizing, both biliary drainage catheters are appropriately positioned with end coiled and locked within the duodenum. IMPRESSION: 1. Successful fluoroscopic guided exchange and up sizing of now 72 French right-sided biliary drainage catheter with end coiled and locked within the duodenum. 2. Successful fluoroscopic guided exchange and up sizing of now 55 French left-sided biliary drainage catheter with end coiled and locked within the duodenum. 3. Appropriately positioned and functioning cholecystostomy tube with persistent complete occlusion of the mid/peripheral aspect of the cystic duct. The cholecystostomy tube was not exchanged as it was most recently exchanged on 01/24/2023. PLAN: Recommend obtaining daily CMP. If patient's hyperbilirubinemia immediate does not improve in the coming days, would recommend further evaluation with repeat either contrast-enhanced abdominal CT or MRCP as indicated. Electronically Signed   By: Simonne Come M.D.   On: 02/14/2023 07:15   IR CHOLANGIOGRAM EXISTING TUBE Result Date: 02/14/2023 INDICATION: Patient initially diagnosed with Mirizzi syndrome, post cholecystostomy tube placement on 01/02/2023. Unfortunately, patient experienced worsening hyperbilirubinemia, ultimately undergoing attempted though unsuccessful ERCP. As such, patient underwent placement of a right-sided biliary drainage  catheter on the 01/31/2023 and ultimately placement of a left-sided biliary drainage catheter on 02/07/2023. 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 Since that time, despite cholecystostomy and bilateral biliary drainage catheter placement, the patient's hyperbilirubinemia has persisted. As such, the patient presents today for cholangiogram and potential biliary drainage catheter exchange and up sizing. EXAM: 1. Cholangiogram via existing cholecystostomy tube. CHOLANGIOGRAM VIA EXISTING CHOLECYSTOSTOMY TUBE 2. FLUOROSCOPIC GUIDED EXCHANGE AND UP SIZING TO NOW 14 FRENCH RIGHT-SIDED BILIARY DRAINAGE CATHETER 3. FLUOROSCOPIC GUIDED EXCHANGE AND UP SIZING TO NOW 12 FRENCH LEFT-SIDED BILIARY DRAINAGE CATHETER COMPARISON:  COMPARISON Image guided left-sided biliary drainage catheter placement and brush biopsy - 02/07/2023 Image guided right-sided biliary drainage catheter placement - 01/31/2023 Cholecystostomy tube exchange - 01/24/2023 Image guided cholecystostomy tube placement - 01/02/2023 CT abdomen and pelvis - 02/06/2023; 02/02/2023 Abdominal MRI - 12/29/2022 CONTRAST:  25 mL Omnipaque-300 administered into the collecting system ANESTHESIA/SEDATION: ANESTHESIA/SEDATION 3 minutes, 6 seconds (117 mGy) FLUOROSCOPY TIME:  3 minutes, 6 seconds (1 image  17 mGy) COMPLICATIONS: None immediate. TECHNIQUE: Informed written consent was obtained from the patient after a discussion of the risks, benefits and alternatives to treatment. Questions regarding the procedure were encouraged and answered. A timeout was performed prior to the initiation of the procedure. Preprocedural spot fluoroscopic image was obtained of the right upper abdominal quadrant. Cholangiogram were performed via the right and left biliary drainage catheters as well as the cholecystostomy tube. Next, the external portion of the left lower drainage catheter was cut and cannulated with an Amplatz wire which was advanced  through the biliary drainage catheter to the level of the duodenum. Under fluoroscopic guided exchange, the pre-existing 10 [place] left-sided biliary drainage catheter was exchanged for a new, slightly larger, now 6 [place] biliary drainage catheter with end coiled and locked within the duodenum. The identical procedure was performed for the right-sided biliary drainage catheter, ultimately with exchange of the pre-existing 12 [place] biliary drainage catheter for a new, slightly larger now 5 [place] biliary drainage catheter with end coiled and locked within the duodenum. Small amount of contrast was injected via the exchanged biliary drainage catheters and post fluoroscopic and radiographic images were obtained. The biliary drainage catheters were and secured to the skin with interrupted sutures and all drainage catheters were reconnected to gravity bags. Dressings were applied. The patient tolerated the procedure well without immediate postprocedural complication. FINDINGS: Appropriately positioned and functioning cholecystostomy tube. There is persistent occlusion of the cystic duct at its mid/peripheral aspects. Appropriate positioning and functionality of both the right and left-sided biliary drainage catheters with opacification of the intrahepatic biliary system as well as passage of contrast through the catheters to the level of the duodenum. After fluoroscopic guided exchange and up sizing, both biliary drainage catheters are appropriately positioned with end coiled and locked within the duodenum. IMPRESSION: 1. Successful fluoroscopic guided exchange and up sizing of now 68 French right-sided biliary drainage catheter with end coiled and locked within the duodenum. 2. Successful fluoroscopic guided exchange and up sizing of now 22 French left-sided biliary drainage catheter with end coiled and locked within the duodenum. 3. Appropriately positioned and functioning cholecystostomy tube with persistent  complete occlusion of the mid/peripheral aspect of the cystic duct. The cholecystostomy tube was not exchanged as it was most recently exchanged on 01/24/2023. PLAN: Recommend obtaining daily CMP. If patient's hyperbilirubinemia immediate does not improve in the coming days, would recommend further evaluation with repeat either contrast-enhanced abdominal CT or MRCP as indicated. Electronically Signed   By: Simonne Come M.D.   On: 02/14/2023 07:15   CT CHEST WO CONTRAST Result Date: 02/13/2023 CLINICAL DATA:  Hepatocellular carcinoma, cholangiocarcinoma, staging evaluation EXAM: CT CHEST WITHOUT CONTRAST TECHNIQUE: Multidetector CT imaging of the chest was performed following the standard protocol without IV contrast. RADIATION DOSE REDUCTION: This exam was performed according to the departmental dose-optimization program which includes automated exposure control, adjustment of the mA and/or kV according to patient size and/or use of iterative reconstruction technique. COMPARISON:  02/06/2023 FINDINGS: Cardiovascular: Unenhanced imaging of the heart is unremarkable without pericardial effusion. Mitral valve prosthesis. 4.1 cm ascending thoracic aortic aneurysm. Evaluation of the vascular lumen is limited without IV contrast. Atherosclerosis of the aorta and coronary vasculature. Mediastinum/Nodes: No enlarged mediastinal or axillary lymph nodes. Thyroid gland, trachea, and esophagus demonstrate no significant findings. Lungs/Pleura: No acute airspace disease, effusion, or pneumothorax. Linear consolidation within the right lower lobe consistent with subsegmental atelectasis or scarring. Upper Abdomen: Percutaneous biliary drains are identified traversing the left and  right lobes of the liver. Downstream extent excluded by slice selection. Percutaneous cholecystostomy tube is seen coiled within the gallbladder fossa. Minimal pneumobilia. Continued edema at the level of porta hepatis which may reflect known  cholangiocarcinoma. Musculoskeletal: Large sclerotic lesions are seen within the C7 and T3 vertebral body suspicious for metastatic disease. There are chronic compression deformities of T4, T9, T11, and L1. Reconstructed images demonstrate no additional findings. IMPRESSION: 1. Large sclerotic lesions within the C7 and T3 vertebral bodies consistent with bony metastatic disease. 2. No other signs of intrathoracic metastases. 3. Stable findings within the liver consistent with known cholangiocarcinoma and indwelling biliary and gallbladder drains. Please refer to recent CT abdomen exam for full description of findings. 4. 4.1 cm ascending thoracic aortic aneurysm. Recommend annual imaging followup by CTA or MRA. This recommendation follows 2010 ACCF/AHA/AATS/ACR/ASA/SCA/SCAI/SIR/STS/SVM Guidelines for the Diagnosis and Management of Patients with Thoracic Aortic Disease. Circulation. 2010; 121: G315-V761. Aortic aneurysm NOS (ICD10-I71.9) 5. Aortic Atherosclerosis (ICD10-I70.0). Coronary artery atherosclerosis. Electronically Signed   By: Sharlet Salina M.D.   On: 02/13/2023 18:57   IR INT EXT BILIARY DRAIN WITH CHOLANGIOGRAM Result Date: 02/07/2023 INDICATION: 77 year old gentleman with history of biliary obstruction underwent percutaneous cholecystostomy placement on 01/02/2023 and replacement on 01/24/2023. Continued symptoms of jaundice and hyperbilirubinemia prompteda MRI on 01/24/2023. Enhancement of the ductal system was suspicious for cholangitis. Right hepatic biliary drain was placed on 01/31/2023. 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 Despite placement of the right hepatic drain, hyperbilirubinemia and jaundice has persisted. He returns today for placement of left hepatic biliary drain as well as brush biopsy of the stenosed segment of the right hepatic duct through a pre-existing access. EXAM: 1.  Ultrasound and fluoroscopy guided left hepatic internal external biliary drain placement 2. Fluoroscopy guided exchange of existing right hepatic internal external biliary drain 3. Brush biopsy of common and right hepatic bile ducts 4. Bilateral percutaneous transhepatic cholangiogram MEDICATIONS: Rocephin 2 g IV; The antibiotic was administered within an appropriate time frame prior to the initiation of the procedure. Demerol IV 50 mg ANESTHESIA/SEDATION: Moderate (conscious) sedation was employed during this procedure. A total of Versed 3 mg and Fentanyl 75 mcg was administered intravenously by the radiology nurse. Total intra-service moderate Sedation Time: 56 minutes. The patient's level of consciousness and vital signs were monitored continuously by radiology nursing throughout the procedure under my direct supervision. FLUOROSCOPY: Radiation Exposure Index (as provided by the fluoroscopic device): 98 mGy Kerma COMPLICATIONS: None immediate. PROCEDURE: Informed written consent was obtained from the patient after a thorough discussion of the procedural risks, benefits and alternatives. All questions were addressed. Maximal Sterile Barrier Technique was utilized including caps, mask, sterile gowns, sterile gloves, sterile drape, hand hygiene and skin antiseptic. A timeout was performed prior to the initiation of the procedure. Patient positioned supine on the procedure table. Epigastric and right upper quadrant abdominal wall skin prepped and draped in the usual sterile fashion. Sonographic evaluation of the left hepatic lobe showed mildly dilated ducts. Following local lidocaine administration, a left hepatic lobe duct was successfully accessed with a 21 gauge needle. 21 gauge needle was removed over 0.018 inch guidewire. The guidewire was advanced to the level of the common bile duct. Contrast administered through the right hepatic drain confirmed appropriate positioning within the right hepatic bile ducts and  doudenum. The right-sided 81 French drain was cut and removed over 0.035 inch guidewire and replaced with  an 8 [place] sheath. The 8 French sheath was advanced beyond the site of right hepatic and common bile duct stenosis. Two brush biopsies were performed at the stenotic segments of the common and right main hepatic ducts. Both samples were sent to pathology in sterile saline. New 12 French internal external biliary drain was inserted over the 0.035 inch guidewire. The pigtail was formed within the doudenum. Attention was again turned to the left hepatic lobe. Transitional dilator set was inserted over the 0.018 inch guidewire. Kumpe catheter and stiff glidewire were advanced to the level of the doudenum utilizing fluoroscopic guidance through this access. Kumpe the catheter was removed and a 10 [place] internal external biliary drain was inserted. The distal pigtail was formed within the doudenum. Contrast administered through both drains confirmed appropriate positioning within the doudenum as well as within the intrahepatic bile ducts. Both drains were secured to skin with silk suture and connected to bag. IMPRESSION: 1. Successful brush biopsy of the stenotic segments of the common bile and right main bile ducts. 2. Successful replacement of 12 French right hepatic internal external biliary drain. 3. Successful insertion of 10 French left hepatic internal external biliary drain. Electronically Signed   By: Acquanetta Belling M.D.   On: 02/07/2023 16:36   IR ENDOLUMINAL BX OF BILIARY TREE Result Date: 02/07/2023 INDICATION: 77 year old gentleman with history of biliary obstruction underwent percutaneous cholecystostomy placement on 01/02/2023 and replacement on 01/24/2023. Continued symptoms of jaundice and hyperbilirubinemia prompteda MRI on 01/24/2023. Enhancement of the ductal system was suspicious for cholangitis. Right hepatic biliary drain was placed on 01/31/2023. Imaging from [facility] on 01/31/2023 was suspicious  for Klatskin tumor with irregular narrowing of central right and left main hepatic ducts. Despite placement of the right hepatic drain, hyperbilirubinemia and jaundice has persisted. He returns today for placement of left hepatic biliary drain as well as brush biopsy of the stenosed segment of the right hepatic duct through a pre-existing access. EXAM: 1. Ultrasound and fluoroscopy guided left hepatic internal external biliary drain placement 2. Fluoroscopy guided exchange of existing right hepatic internal external biliary drain 3. Brush biopsy of common and right hepatic bile ducts 4. Bilateral percutaneous transhepatic cholangiogram MEDICATIONS: Rocephin 2 g IV; The antibiotic was administered within an appropriate time frame prior to the initiation of the procedure. Demerol IV 50 mg ANESTHESIA/SEDATION: Moderate (conscious) sedation was employed during this procedure. A total of Versed 3 mg and Fentanyl 75 mcg was administered intravenously by the radiology nurse. Total intra-service moderate Sedation Time: 56 minutes. The patient's level of consciousness and vital signs were monitored continuously by radiology nursing throughout the procedure under my direct supervision. FLUOROSCOPY: Radiation Exposure Index (as provided by the fluoroscopic device): 98 mGy Kerma COMPLICATIONS: None immediate. PROCEDURE: Informed written consent was obtained from the patient after a thorough discussion of the procedural risks, benefits and alternatives. All questions were addressed. Maximal Sterile Barrier Technique was utilized including caps, mask, sterile gowns, sterile gloves, sterile drape, hand hygiene and skin antiseptic. A timeout was performed prior to the initiation of the procedure. Patient positioned supine on the procedure table. Epigastric and right upper quadrant abdominal wall skin prepped and draped in the usual sterile fashion. Sonographic evaluation of the left hepatic lobe showed mildly dilated ducts.  Following local lidocaine administration, a left hepatic lobe duct was successfully accessed with a 21 gauge needle. 21 gauge needle was removed over 0.018 inch guidewire. The guidewire was advanced to the level of the common bile duct. Contrast administered  through the right hepatic drain confirmed appropriate positioning within the right hepatic bile ducts and doudenum. The right-sided 38 French drain was cut and removed over 0.035 inch guidewire and replaced with an 8 [place] sheath. The 8 French sheath was advanced beyond the site of right hepatic and common bile duct stenosis. Two brush biopsies were performed at the stenotic segments of the common and right main hepatic ducts. Both samples were sent to pathology in sterile saline. New 12 French internal external biliary drain was inserted over the 0.035 inch guidewire. The pigtail was formed within the doudenum. Attention was again turned to the left hepatic lobe. Transitional dilator set was inserted over the 0.018 inch guidewire. Kumpe catheter and stiff glidewire were advanced to the level of the doudenum utilizing fluoroscopic guidance through this access. Kumpe the catheter was removed and a 10 [place] internal external biliary drain was inserted. The distal pigtail was formed within the doudenum. Contrast administered through both drains confirmed appropriate positioning within the doudenum as well as within the intrahepatic bile ducts. Both drains were secured to skin with silk suture and connected to bag. IMPRESSION: 1. Successful brush biopsy of the stenotic segments of the common bile and right main bile ducts. 2. Successful replacement of 12 French right hepatic internal external biliary drain. 3. Successful insertion of 10 French left hepatic internal external biliary drain. Electronically Signed   By: Acquanetta Belling M.D.   On: 02/07/2023 16:36   IR ENDOLUMINAL BX OF BILIARY TREE Result Date: 02/07/2023 INDICATION: 77 year old gentleman with history  of biliary obstruction underwent percutaneous cholecystostomy placement on 01/02/2023 and replacement on 01/24/2023. Continued symptoms of jaundice and hyperbilirubinemia prompteda MRI on 01/24/2023. Enhancement of the ductal system was suspicious for cholangitis. Right hepatic biliary drain was placed on 01/31/2023. 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 Despite placement of the right hepatic drain, hyperbilirubinemia and jaundice has persisted. He returns today for placement of left hepatic biliary drain as well as brush biopsy of the stenosed segment of the right hepatic duct through a pre-existing access. EXAM: 1. Ultrasound and fluoroscopy guided left hepatic internal external biliary drain placement 2. Fluoroscopy guided exchange of existing right hepatic internal external biliary drain 3. Brush biopsy of common and right hepatic bile ducts 4. Bilateral percutaneous transhepatic cholangiogram MEDICATIONS: Rocephin 2 g IV; The antibiotic was administered within an appropriate time frame prior to the initiation of the procedure. Demerol IV 50 mg ANESTHESIA/SEDATION: Moderate (conscious) sedation was employed during this procedure. A total of Versed 3 mg and Fentanyl 75 mcg was administered intravenously by the radiology nurse. Total intra-service moderate Sedation Time: 56 minutes. The patient's level of consciousness and vital signs were monitored continuously by radiology nursing throughout the procedure under my direct supervision. FLUOROSCOPY: Radiation Exposure Index (as provided by the fluoroscopic device): 98 mGy Kerma COMPLICATIONS: None immediate. PROCEDURE: Informed written consent was obtained from the patient after a thorough discussion of the procedural risks, benefits and alternatives. All questions were addressed. Maximal Sterile Barrier Technique was utilized including caps, mask, sterile gowns,  sterile gloves, sterile drape, hand hygiene and skin antiseptic. A timeout was performed prior to the initiation of the procedure. Patient positioned supine on the procedure table. Epigastric and right upper quadrant abdominal wall skin prepped and draped in the usual sterile fashion. Sonographic evaluation of the left hepatic lobe showed mildly dilated ducts. Following local lidocaine administration, a left hepatic lobe  duct was successfully accessed with a 21 gauge needle. 21 gauge needle was removed over 0.018 inch guidewire. The guidewire was advanced to the level of the common bile duct. Contrast administered through the right hepatic drain confirmed appropriate positioning within the right hepatic bile ducts and doudenum. The right-sided 78 French drain was cut and removed over 0.035 inch guidewire and replaced with an 8 [place] sheath. The 8 French sheath was advanced beyond the site of right hepatic and common bile duct stenosis. Two brush biopsies were performed at the stenotic segments of the common and right main hepatic ducts. Both samples were sent to pathology in sterile saline. New 12 French internal external biliary drain was inserted over the 0.035 inch guidewire. The pigtail was formed within the doudenum. Attention was again turned to the left hepatic lobe. Transitional dilator set was inserted over the 0.018 inch guidewire. Kumpe catheter and stiff glidewire were advanced to the level of the doudenum utilizing fluoroscopic guidance through this access. Kumpe the catheter was removed and a 10 [place] internal external biliary drain was inserted. The distal pigtail was formed within the doudenum. Contrast administered through both drains confirmed appropriate positioning within the doudenum as well as within the intrahepatic bile ducts. Both drains were secured to skin with silk suture and connected to bag. IMPRESSION: 1. Successful brush biopsy of the stenotic segments of the common bile and right  main bile ducts. 2. Successful replacement of 12 French right hepatic internal external biliary drain. 3. Successful insertion of 10 French left hepatic internal external biliary drain. Electronically Signed   By: Acquanetta Belling M.D.   On: 02/07/2023 16:36   IR EXCHANGE BILIARY DRAIN Result Date: 02/07/2023 INDICATION: 77 year old gentleman with history of biliary obstruction underwent percutaneous cholecystostomy placement on 01/02/2023 and replacement on 01/24/2023. Continued symptoms of jaundice and hyperbilirubinemia prompteda MRI on 01/24/2023. Enhancement of the ductal system was suspicious for cholangitis. Right hepatic biliary drain was placed on 01/31/2023. 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 Despite placement of the right hepatic drain, hyperbilirubinemia and jaundice has persisted. He returns today for placement of left hepatic biliary drain as well as brush biopsy of the stenosed segment of the right hepatic duct through a pre-existing access. EXAM: 1. Ultrasound and fluoroscopy guided left hepatic internal external biliary drain placement 2. Fluoroscopy guided exchange of existing right hepatic internal external biliary drain 3. Brush biopsy of common and right hepatic bile ducts 4. Bilateral percutaneous transhepatic cholangiogram MEDICATIONS: Rocephin 2 g IV; The antibiotic was administered within an appropriate time frame prior to the initiation of the procedure. Demerol IV 50 mg ANESTHESIA/SEDATION: Moderate (conscious) sedation was employed during this procedure. A total of Versed 3 mg and Fentanyl 75 mcg was administered intravenously by the radiology nurse. Total intra-service moderate Sedation Time: 56 minutes. The patient's level of consciousness and vital signs were monitored continuously by radiology nursing throughout the procedure under my direct supervision. FLUOROSCOPY: Radiation Exposure  Index (as provided by the fluoroscopic device): 98 mGy Kerma COMPLICATIONS: None immediate. PROCEDURE: Informed written consent was obtained from the patient after a thorough discussion of the procedural risks, benefits and alternatives. All questions were addressed. Maximal Sterile Barrier Technique was utilized including caps, mask, sterile gowns, sterile gloves, sterile drape, hand hygiene and skin antiseptic. A timeout was performed prior to the initiation of the procedure. Patient positioned supine on the procedure table. Epigastric and right upper quadrant  abdominal wall skin prepped and draped in the usual sterile fashion. Sonographic evaluation of the left hepatic lobe showed mildly dilated ducts. Following local lidocaine administration, a left hepatic lobe duct was successfully accessed with a 21 gauge needle. 21 gauge needle was removed over 0.018 inch guidewire. The guidewire was advanced to the level of the common bile duct. Contrast administered through the right hepatic drain confirmed appropriate positioning within the right hepatic bile ducts and doudenum. The right-sided 79 French drain was cut and removed over 0.035 inch guidewire and replaced with an 8 [place] sheath. The 8 French sheath was advanced beyond the site of right hepatic and common bile duct stenosis. Two brush biopsies were performed at the stenotic segments of the common and right main hepatic ducts. Both samples were sent to pathology in sterile saline. New 12 French internal external biliary drain was inserted over the 0.035 inch guidewire. The pigtail was formed within the doudenum. Attention was again turned to the left hepatic lobe. Transitional dilator set was inserted over the 0.018 inch guidewire. Kumpe catheter and stiff glidewire were advanced to the level of the doudenum utilizing fluoroscopic guidance through this access. Kumpe the catheter was removed and a 10 [place] internal external biliary drain was inserted. The  distal pigtail was formed within the doudenum. Contrast administered through both drains confirmed appropriate positioning within the doudenum as well as within the intrahepatic bile ducts. Both drains were secured to skin with silk suture and connected to bag. IMPRESSION: 1. Successful brush biopsy of the stenotic segments of the common bile and right main bile ducts. 2. Successful replacement of 12 French right hepatic internal external biliary drain. 3. Successful insertion of 10 French left hepatic internal external biliary drain. Electronically Signed   By: Acquanetta Belling M.D.   On: 02/07/2023 16:36   CT ABDOMEN W CONTRAST Result Date: 02/07/2023 CLINICAL DATA:  77 year old male with a history of biliary obstruction. The original ultrasound 12/26/2022 demonstrates evidence of acute cholecystitis, which was present on a follow-up MRI 12/29/2022. Following this a percutaneous cholecystostomy was placed 01/02/2023 and replaced 01/24/2023. Ongoing symptoms of jaundice and hyperbilirubinemia prompted a second MRI 01/24/2023. MR suggested cholangitis given some enhancement along the ductal system, undrained intrahepatic bile ducts. Internal external biliary drain was placed 01/31/2023. 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 A follow-up CT 01/31/2023 and 02/02/2023 performed, with no radiopaque gallstones accounting for biliary obstruction. The patient has had ongoing hyperbilirubinemia which has plateaued at the level of the proximally 14 and remains symptomatic. He presents for this CT study as a planning study preoperatively 4 left-sided biliary drainage. EXAM: CT ABDOMEN WITH CONTRAST TECHNIQUE: Multidetector CT imaging of the abdomen was performed using the standard protocol following bolus administration of intravenous contrast. RADIATION DOSE REDUCTION: This  exam was performed according to the departmental dose-optimization program which includes automated exposure control, adjustment of the mA and/or kV according to patient size and/or use of iterative reconstruction technique. CONTRAST:  75mL OMNIPAQUE IOHEXOL 350 MG/ML SOLN COMPARISON:  Ultrasound 12/26/2022, MR 12/29/2022, per coli 01/02/2023, MR 01/24/2023, CT 01/31/2023, CT 02/02/2023 PTC and drainage 01/31/2023 FINDINGS: Lower chest: No acute finding of the lower chest. Minimal atelectasis and trace fluid. Hepatobiliary: Left-sided intrahepatic biliary ductal dilatation. There is associated periportal edema. There are a few small mildly dilated intrahepatic ducts of the right liver, particularly of the segment 8. Percutaneous  transhepatic internal/external biliary drain via right-sided approach remains adequately position with the radiopaque marker in the bile ducts and the term in a shin of the drain in the small bowel. Percutaneous cholecystostomy tube in place within the fundus of the gallbladder. No radiopaque stones. There are no radiopaque stones identified along the course of the internal/external biliary drain. Region of hypodensity/hypoenhancement at the confluence of the left and right biliary ducts, at the liver hilum on image 19 of series 3. This region measures 13 mm x 42 mm on the axial images. No cirrhotic changes Pancreas: Attenuation/enhancement of the pancreas within normal limits. Mild ductal dilatation of the pancreatic duct. Spleen: Unremarkable Adrenals/Urinary Tract: - Right adrenal gland:  Unremarkable - Left adrenal gland: Unremarkable. - Right kidney: No hydronephrosis, nephrolithiasis, inflammation, or ureteral dilation. No focal lesion. - Left Kidney: No hydronephrosis, nephrolithiasis, inflammation, or ureteral dilation. No focal lesion. Stomach/Bowel: - Stomach: Small hiatal hernia.  Otherwise unremarkable stomach. - Small bowel: Duodenal diverticulum, which was identified on prior  ERCP attempt. Internal/external biliary drain terminates in the duodenum. Visualized small bowel and colon unremarkable with no distension. Vascular/Lymphatic: Calcifications of the abdominal aorta. Mesenteric arteries and renal arteries patent. Other: None Musculoskeletal: Unchanged configuration of the visualized vertebral bodies including compression fracture of L1 favored to be chronic. No bony canal narrowing. IMPRESSION: Right-sided internal/external biliary drain in adequate position across the right-sided biliary ducts and into the duodenum. The majority of the right-sided ductal system is decompressed, with some mild residual ductal dilatation involving segment 8. Persistent left-sided intrahepatic biliary ductal dilatation. Nonspecific focus of low attenuation/hypoenhancement at the confluence of the left and right-sided biliary ducts in the liver hilum. This may reflect focal edema, however, a Klatskin tumor cannot be excluded as etiology for the ductal dilatation. Percutaneous cholecystostomy in position in the gallbladder. Aortic Atherosclerosis (ICD10-I70.0). Electronically Signed   By: Gilmer Mor D.O.   On: 02/07/2023 11:21   DG CHEST PORT 1 VIEW Result Date: 02/02/2023 CLINICAL DATA:  Fever EXAM: PORTABLE CHEST 1 VIEW COMPARISON:  12/26/2022 x-ray FINDINGS: Status post median sternotomy. Enlarged cardiopericardial silhouette with calcified tortuous aorta. Prosthetic valve. Overlapping cardiac leads. There is some linear opacity at the bases likely scar or atelectasis. No pneumothorax, effusion or edema. Healed right eighth rib fracture. IMPRESSION: Postop chest with enlarged heart and calcified aorta. Basilar atelectasis Electronically Signed   By: Karen Kays M.D.   On: 02/02/2023 16:08   CT ABDOMEN PELVIS WO CONTRAST Result Date: 02/02/2023 CLINICAL DATA:  Anemia. EXAM: CT ABDOMEN AND PELVIS WITHOUT CONTRAST TECHNIQUE: Multidetector CT imaging of the abdomen and pelvis was performed  following the standard protocol without IV contrast. RADIATION DOSE REDUCTION: This exam was performed according to the departmental dose-optimization program which includes automated exposure control, adjustment of the mA and/or kV according to patient size and/or use of iterative reconstruction technique. COMPARISON:  January 31, 2023. FINDINGS: Lower chest: Minimal bibasilar subsegmental atelectasis is noted. Hepatobiliary: Percutaneous cholecystostomy tube is again noted with decompression of the bladder. Stable position of internal external biliary drain with distal tip in duodenum. Stable mild left hepatic biliary dilatation with periportal edema. Pancreas: Unremarkable. No pancreatic ductal dilatation or surrounding inflammatory changes. Spleen: Normal in size without focal abnormality. Adrenals/Urinary Tract: Adrenal glands are unremarkable. Kidneys are normal, without renal calculi, focal lesion, or hydronephrosis. Bladder is unremarkable. Stomach/Bowel: Stomach is within normal limits. Appendix appears normal. No evidence of bowel wall thickening, distention, or inflammatory changes. Vascular/Lymphatic: Aortic atherosclerosis. No enlarged abdominal or pelvic  lymph nodes. Reproductive: Mild prostatic enlargement is noted. Other: No abdominal wall hernia or abnormality. No abdominopelvic ascites. Musculoskeletal: Old L1 and L5 fractures are again noted. No acute abnormality seen. IMPRESSION: Percutaneous cholecystostomy tube is again noted. Stable position of internal external biliary drain with distal tip in duodenum. Stable mild left hepatic biliary dilatation with periportal edema. Electronically Signed   By: Lupita Raider M.D.   On: 02/02/2023 13:43   CT ABDOMEN PELVIS W CONTRAST Result Date: 01/31/2023 CLINICAL DATA:  Gallbladder and biliary cancer. EXAM: CT ABDOMEN AND PELVIS WITH CONTRAST TECHNIQUE: Multidetector CT imaging of the abdomen and pelvis was performed using the standard protocol  following bolus administration of intravenous contrast. RADIATION DOSE REDUCTION: This exam was performed according to the departmental dose-optimization program which includes automated exposure control, adjustment of the mA and/or kV according to patient size and/or use of iterative reconstruction technique. CONTRAST:  75mL OMNIPAQUE IOHEXOL 350 MG/ML SOLN COMPARISON:  MRI abdomen 01/24/2023. FINDINGS: Lower chest: There is atelectasis in the lung bases with trace bilateral pleural effusions. Hepatobiliary: Transhepatic percutaneous biliary catheter present with distal tip in the duodenal. There is mild intrahepatic biliary ductal dilatation in the left lobe of the liver with some periportal edema similar to prior MRI given differences in technique. There is a small amount of pneumobilia and contrast throughout the biliary tree likely related to recent procedure. No focal liver lesions are seen. Percutaneous cholecystostomy tube present. There some contrast in the gallbladder. The gallbladder is decompressed and there is diffuse wall thickening similar to prior. Pancreas: Unremarkable. No pancreatic ductal dilatation or surrounding inflammatory changes. Spleen: Normal in size without focal abnormality. Adrenals/Urinary Tract: Adrenal glands are unremarkable. Kidneys are normal, without renal calculi, focal lesion, or hydronephrosis. Bladder is unremarkable. Stomach/Bowel: Stomach is within normal limits. Appendix appears normal. No evidence of bowel wall thickening, distention, or inflammatory changes. There is sigmoid colon diverticulosis. Vascular/Lymphatic: No significant vascular findings are present. No enlarged abdominal or pelvic lymph nodes. Reproductive: Prostate gland is enlarged. Other: There is presacral edema. There is trace free fluid in the right upper quadrant similar to prior. There is no focal abdominal wall hernia. Musculoskeletal: There are compression deformities of T8, T9, T11, L1 and L5  which are favored as chronic, but age indeterminate. The bones are diffusely osteopenic. There is mild body wall edema. IMPRESSION: 1. Transhepatic percutaneous biliary catheter present with distal tip in the duodenum. 2. Percutaneous cholecystostomy tube present. Gallbladder is decompressed with diffuse wall thickening similar to prior. 3. Mild intrahepatic biliary ductal dilatation in the left lobe of the liver with periportal edema similar to prior MRI. 4. Trace free fluid in the right upper quadrant similar to prior. 5. Trace bilateral pleural effusions. 6. Mild body wall edema. 7. Multiple compression deformities of the thoracic and lumbar spine are favored as chronic, but age indeterminate. Correlate clinically. Electronically Signed   By: Darliss Cheney M.D.   On: 01/31/2023 20:37   IR BILIARY DRAIN PLACEMENT WITH CHOLANGIOGRAM Result Date: 01/31/2023 INDICATION: 77 year old male with biliary obstruction, presents for percutaneous transhepatic cholangiogram and internal/external drain placement EXAM: IMAGE GUIDED PERCUTANEOUS TRANSHEPATIC CHOLANGIOGRAM INTERNAL/EXTERNAL BILIARY DRAIN PLACEMENT MEDICATIONS: 2 g Mefoxin; The antibiotic was administered within an appropriate time frame prior to the initiation of the procedure. ANESTHESIA/SEDATION: Moderate (conscious) sedation was employed during this procedure. A total of Versed 2 mg and Fentanyl 150 mcg was administered intravenously by the radiology nurse. Total intra-service moderate Sedation Time: 43 minutes. The patient's level of consciousness  and vital signs were monitored continuously by radiology nursing throughout the procedure under my direct supervision. FLUOROSCOPY: Radiation Exposure Index (as provided by the fluoroscopic device): 282 mGy Kerma COMPLICATIONS: None PROCEDURE: The procedure, risks, benefits, and alternatives were explained to the patient and the patient's family. A complete informed consent was performed, with risk benefit  analysis. Specific risks that were discussed for the procedure include bleeding, infection, biliary sepsis, IC use day, organ injury, need for further procedure, need for further surgery, long-term drain placement, cardiopulmonary collapse, death. Questions regarding the procedure were encouraged and answered. The patient understands and consents to the procedure. Patient is position in supine position on the fluoroscopy table, and the upper abdomen was prepped and draped in the usual sterile fashion. Maximum barrier sterile technique with sterile gowns and gloves were used for the procedure. A timeout was performed prior to the initiation of the procedure. Local anesthesia was provided with 1% lidocaine with epinephrine. Ultrasound survey of the left liver lobe was performed, with then ultrasound of the right liver lobe. We elected to access transhepatic biliary system via the right. 1% lidocaine was used for local anesthesia, with generous infiltration of the skin and subcutaneous tissues in and inter left costal location. A Chiba needle was advanced under ultrasound guidance into the right liver lobe, targeting biliary system. Once the tip of the needle was confirmed within the biliary system by injecting small aliquots of contrast, images were stored of the biliary system after partially opacifying the biliary tree via the needle. An 018 wire was then advanced centrally. The needle was removed, a small incision was made with an 11 blade scalpel, and then a triaxial Bard system was advanced into the biliary system. The metal stiffener and dilator were removed, we confirmed placement with contrast infusion. We then attempted to navigate through stenotic ductal system at the hilum, presumably the common hepatic duct. A coaxial Glidewire and 4 French glide cath were then used to attempt to navigate across the obstruction at the hilum of the liver. A combination of the glide cath, Glidewire, and a roadrunner wire  were required to access the common hepatic duct. Once the catheter and wire were advanced into the external hepatic ducts, further images were acquired. This demonstrated that the duodenal diverticulum was distorting the anatomy at the ampulla. After some initial attempts fail to cross the ampulla, a coon's wire was placed and then a 6 French 35 cm straight sheath was placed into the common bile duct. At this point we were able to navigate the angled diagnostic catheter and the Glidewire through the ampulla into the duodenum. With the diagnostic catheter in the second portion the duodenum, the coon's wire was then placed. Twelve French dilation of the subcutaneous tissue tracks was performed, and then a 54 [place] biliary drain was placed as an internal/external biliary drain. Small amount of contrast confirmed location. The patient tolerated the procedure well and remained hemodynamically stable throughout. No complications were encountered and no significant blood loss was encountered. FINDINGS: Percutaneous transhepatic cholangiogram demonstrates significant stenotic narrowing in the bile ducts at the hilum of the liver, suspicious for malignancy/[place] tumor IMPRESSION: Status post image guided percutaneous transhepatic cholangiogram with internal/external biliary drain placement. Injection of the percutaneous cholecystostomy tube demonstrates that the cystic duct remains occluded. Signed, Yvone Neu. Miachel Roux, RPVI [facility] Radiology Electronically Signed   By: Gilmer Mor D.O.   On: 01/31/2023 16:46   DG C-Arm 1-60 Min-No Report  Result Date: 01/29/2023 Fluoroscopy was utilized by the requesting physician.  No radiographic interpretation.   MR ABDOMEN MRCP W WO CONTAST Result Date: 01/24/2023 CLINICAL DATA:  Jaundice. History of acute cholecystitis status post percutaneous cholecystostomy tube placement 01/02/2023 EXAM: MRI ABDOMEN WITHOUT AND  WITH CONTRAST (INCLUDING MRCP) TECHNIQUE: Multiplanar multisequence MR imaging of the abdomen was performed both before and after the administration of intravenous contrast. Heavily T2-weighted images of the biliary and pancreatic ducts were obtained. Post-processing was applied at the acquisition scanner with concurrent physician supervision which includes 3D reconstructions, MIPs, volume rendered images and/or shaded surface rendering. CONTRAST:  8.62mL GADAVIST GADOBUTROL 1 MMOL/ML IV SOLN COMPARISON:  MR abdomen dated 12/29/2022 FINDINGS: Decreased sensitivity and specificity for detailed findings due to motion artifact. Lower chest: Trace bilateral pleural effusions. Hepatobiliary: Parenchymal signal abnormality. Scattered subcentimeter T2 hyperintense foci, likely cysts. Increased mild to moderate intrahepatic bile duct dilation more prominent in the left hepatic lobe with new mural thickening of the extrahepatic bile duct (4:16) and enhancement (1304:45). The common bile duct is nondilated. Gallbladder is decompressed with catheter in-situ and demonstrates circumferential mural thickening. Pancreas: No mass or inflammatory changes. 3 mm T2 hyperintense cystic focus arises directly from the main pancreatic duct in the pancreatic neck (6:20) keeping with side branch intraductal papillary mucinous neoplasms (IPMN). No main ductal dilation, mass lesion, or abnormal enhancement. Spleen:  Within normal limits in size and appearance. Adrenals/Urinary Tract: No adrenal nodules. No suspicious renal masses identified. No evidence of hydronephrosis. Stomach/Bowel: Small proximal duodenal diverticulum. Colonic diverticulosis without acute diverticulitis. Vascular/Lymphatic: No pathologically enlarged lymph nodes identified. No abdominal aortic aneurysm demonstrated. Other:  Trace ascites. Musculoskeletal: No suspicious bone lesions identified. Susceptibility artifact related to median sternotomy wires. IMPRESSION: 1.  Increased mild to moderate intrahepatic bile duct dilation, more prominent in the left hepatic lobe, with new mural thickening and enhancement of the extrahepatic bile duct, suspicious for cholangitis. 2. Decompressed gallbladder with catheter in-situ and circumferential mural thickening. 3. Trace bilateral pleural effusions and trace ascites. Electronically Signed   By: Agustin Cree M.D.   On: 01/24/2023 15:54   MR 3D Recon At Scanner Result Date: 01/24/2023 CLINICAL DATA:  Jaundice. History of acute cholecystitis status post percutaneous cholecystostomy tube placement 01/02/2023 EXAM: MRI ABDOMEN WITHOUT AND WITH CONTRAST (INCLUDING MRCP) TECHNIQUE: Multiplanar multisequence MR imaging of the abdomen was performed both before and after the administration of intravenous contrast. Heavily T2-weighted images of the biliary and pancreatic ducts were obtained. Post-processing was applied at the acquisition scanner with concurrent physician supervision which includes 3D reconstructions, MIPs, volume rendered images and/or shaded surface rendering. CONTRAST:  8.9mL GADAVIST GADOBUTROL 1 MMOL/ML IV SOLN COMPARISON:  MR abdomen dated 12/29/2022 FINDINGS: Decreased sensitivity and specificity for detailed findings due to motion artifact. Lower chest: Trace bilateral pleural effusions. Hepatobiliary: Parenchymal signal abnormality. Scattered subcentimeter T2 hyperintense foci, likely cysts. Increased mild to moderate intrahepatic bile duct dilation more prominent in the left hepatic lobe with new mural thickening of the extrahepatic bile duct (4:16) and enhancement (1304:45). The common bile duct is nondilated. Gallbladder is decompressed with catheter in-situ and demonstrates circumferential mural thickening. Pancreas: No mass or inflammatory changes. 3 mm T2 hyperintense cystic focus arises directly from the main pancreatic duct in the pancreatic neck (6:20) keeping with side branch intraductal papillary mucinous  neoplasms (IPMN). No main ductal dilation, mass lesion, or abnormal enhancement. Spleen:  Within normal limits in size and appearance. Adrenals/Urinary Tract: No adrenal nodules. No suspicious renal masses identified. No evidence  of hydronephrosis. Stomach/Bowel: Small proximal duodenal diverticulum. Colonic diverticulosis without acute diverticulitis. Vascular/Lymphatic: No pathologically enlarged lymph nodes identified. No abdominal aortic aneurysm demonstrated. Other:  Trace ascites. Musculoskeletal: No suspicious bone lesions identified. Susceptibility artifact related to median sternotomy wires. IMPRESSION: 1. Increased mild to moderate intrahepatic bile duct dilation, more prominent in the left hepatic lobe, with new mural thickening and enhancement of the extrahepatic bile duct, suspicious for cholangitis. 2. Decompressed gallbladder with catheter in-situ and circumferential mural thickening. 3. Trace bilateral pleural effusions and trace ascites. Electronically Signed   By: Agustin Cree M.D.   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 Patient now with worsening hyperbilirubinemia and jaundice and as such presents for cholangiogram and potential cholecystostomy tube exchange. EXAM: FLUOROSCOPIC GUIDED CHOLECYSTOSTOMY TUBE EXCHANGE COMPARISON:  MRCP-12/29/2022 Image guided cholecystostomy tube placement-01/02/2023 MEDICATIONS: None ANESTHESIA/SEDATION: None CONTRAST:  15mL OMNIPAQUE IOHEXOL 300 MG/ML SOLN - administered into the gallbladder lumen. FLUOROSCOPY TIME:  1 minute, 6 seconds (6.1 mGy) COMPLICATIONS: None immediate. PROCEDURE: The patient was positioned supine on the fluoroscopy table. The external portion of the existing cholecystostomy tube as well as the surrounding skin was prepped and draped in usual sterile  fashion. A time-out was performed prior to the initiation of the procedure. A preprocedural spot fluoroscopic image was obtained of the right upper abdominal quadrant existing cholecystostomy tube. The skin surrounding the cholecystostomy tube was anesthetized with 1% lidocaine with epinephrine. The external portion of the cholecystostomy tube was cut and cannulated with a short Amplatz wire which was advanced through the tube and coiled within the gallbladder lumen Next, under intermittent fluoroscopic guidance, the existing 10 [place] cholecystostomy tube was exchanged for a new, slightly larger now 10 [place] cholecystostomy tube which was repositioned into the more central aspect of the gallbladder lumen. Contrast injection confirms appropriate positioning and functionality of the cholecystomy tube. The cholecystostomy tube was flushed with a small amount of saline and reconnected to a gravity bag. The cholecystostomy tube was secured with an interrupted suture and a Stat Lock device. A dressing was applied. The patient tolerated the procedure well without immediate postprocedural complication. FINDINGS: Preprocedural spot fluoroscopic image demonstrates unchanged positioning of cholecystostomy tube with end coiled and locked over the expected location of the fundus of the gallbladder. Contrast injection demonstrates appropriate positioning and functionality of the existing cholecystostomy tube. There is opacification of the peripheral aspect of the cystic duct without passage of contrast to the level of the CBD. After fluoroscopic guided exchange, the new, 10 [place] cholecystostomy tube is appropriately positioned within the gallbladder lumen. Post exchange cholangiogram demonstrates appropriate positioning and functionality of the new cholecystostomy tube. IMPRESSION: Successful fluoroscopic guided exchange of 10 French cholecystostomy tube. PLAN: - Cholangiogram demonstrates persistent occlusion of the cystic  duct without opacification of the CBD. 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 The patient is visibly jaundiced with worsening hyperbilirubinemia (bilirubin - 3.9 on 11/11, 0.6 on the day of cholecystostomy tube placement) and as such the decision was made to escort the patient to the emergency department for further evaluation and management as the patient will likely require an ERCP to relieve his worsening biliary obstruction. Electronically Signed   By: Simonne Come M.D.   On: 01/24/2023 11:06    Labs:  CBC: Recent Labs    02/08/23 0158 02/09/23  0207 02/11/23 0207 02/12/23 0217 02/14/23 0201  WBC 13.1* 11.0* 8.9  --  12.1*  HGB 9.5* 8.9* 8.1* 9.1* 9.9*  HCT 28.3* 26.8* 23.8* 27.1* 29.7*  PLT 239 274 328  --  507*    COAGS: Recent Labs    12/30/22 2151 12/31/22 0705 12/31/22 1747 01/01/23 1154 01/25/23 0548 01/31/23 0516  INR 1.3*  --   --   --  1.2 1.1  APTT 36 34 61* 43*  --   --     BMP: Recent Labs    02/14/23 0201 02/15/23 0203 02/16/23 0213 02/17/23 0205  NA 132* 128* 131* 128*  K 4.3 4.8 4.8 4.6  CL 98 97* 98 94*  CO2 23 23 25 26   GLUCOSE 107* 109* 124* 122*  BUN 39* 47* 45* 41*  CALCIUM 8.9 8.5* 8.8* 8.7*  CREATININE 1.11 1.19 1.26* 1.11  GFRNONAA >60 >60 58* >60    LIVER FUNCTION TESTS: Recent Labs    02/14/23 0201 02/15/23 0203 02/16/23 0213 02/17/23 0205  BILITOT 11.5* 10.1* 9.9* 9.0*  AST 120* 114* 102* 114*  ALT 104* 93* 86* 83*  ALKPHOS 310* 277* 265* 253*  PROT 5.7* 5.1* 5.2* 5.1*  ALBUMIN 1.8* 1.8* 1.8* 1.9*     TUMOR MARKERS: No results for input(s): "AFPTM", "CEA", "CA199", "CHROMGRNA" in the last 8760 hours.  Assessment and Plan:   Drain Location: RUQ Size: Fr size: 12 Fr Date of placement: 01/02/23 Currently to: Drain collection device: gravity 24 hour output:  Output by Drain (mL) 02/15/23 0701 - 02/15/23 1900 02/15/23 1901 -  02/16/23 0700 02/16/23 0701 - 02/16/23 1900 02/16/23 1901 - 02/17/23 0700 02/17/23 0701 - 02/17/23 1201  Biliary Tube Cook slip-coat 12 Fr. RUQ 100 10 0 25   Biliary Tube Cook slip-coat 12 Fr. LUQ 200 150 320 225   Biliary Tube Cook slip-coat 14 Fr. RUQ 175 140 180 200    Drain Location: RUQ Size: Fr size: 14 Fr Date of placement: 01/31/23  Currently to: Drain collection device: gravity 24 hour output:  Output by Drain (mL) 02/15/23 0701 - 02/15/23 1900 02/15/23 1901 - 02/16/23 0700 02/16/23 0701 - 02/16/23 1900 02/16/23 1901 - 02/17/23 0700 02/17/23 0701 - 02/17/23 1235  Biliary Tube Cook slip-coat 12 Fr. RUQ 100 10 0 25   Biliary Tube Cook slip-coat 12 Fr. LUQ 200 150 320 225   Biliary Tube Cook slip-coat 14 Fr. RUQ 175 140 180 200     Drain Location: LUQ Size: Fr size: 12 Fr Date of placement: 02/07/23  Currently to: Drain collection device: gravity 24 hour output:  Output by Drain (mL) 02/15/23 0701 - 02/15/23 1900 02/15/23 1901 - 02/16/23 0700 02/16/23 0701 - 02/16/23 1900 02/16/23 1901 - 02/17/23 0700 02/17/23 0701 - 02/17/23 1235  Biliary Tube Cook slip-coat 12 Fr. RUQ 100 10 0 25   Biliary Tube Cook slip-coat 12 Fr. LUQ 200 150 320 225   Biliary Tube Cook slip-coat 14 Fr. RUQ 175 140 180 200      Plan: Continue TID flushes with 5 cc NS. Record output Q shift. Dressing changes QD or PRN if soiled.  Call IR APP or on call IR MD if difficulty flushing or sudden change in drain output.  Repeat imaging/possible drain injection once output < 10 mL/QD (excluding flush material). Consideration for drain removal if output is < 10 mL/QD (excluding flush material), pending discussion with the providing surgical service.  Discharge planning: Please contact IR APP or on call IR  MD prior to patient d/c to ensure appropriate follow up plans are in place. Typically patient will follow up with IR clinic 10-14 days post d/c for repeat imaging/possible drain injection. IR scheduler will  contact patient with date/time of appointment. Patient will need to flush drain QD with 5 cc NS, record output QD, dressing changes every 2-3 days or earlier if soiled.   IR will continue to follow - please call with questions or concerns.   Electronically Signed: Rosalita Levan, PA 02/17/2023, 8:33 AM   I spent a total of 25 minutes at the patient's bedside AND on the patient's hospital floor or unit, greater than 50% of which was counseling/coordinating care for biliary obstruction

## 2023-02-18 ENCOUNTER — Other Ambulatory Visit: Payer: Self-pay

## 2023-02-18 DIAGNOSIS — R531 Weakness: Secondary | ICD-10-CM | POA: Diagnosis not present

## 2023-02-18 DIAGNOSIS — K831 Obstruction of bile duct: Secondary | ICD-10-CM | POA: Diagnosis not present

## 2023-02-18 DIAGNOSIS — C221 Intrahepatic bile duct carcinoma: Secondary | ICD-10-CM | POA: Insufficient documentation

## 2023-02-18 DIAGNOSIS — K851 Biliary acute pancreatitis without necrosis or infection: Secondary | ICD-10-CM | POA: Diagnosis not present

## 2023-02-18 DIAGNOSIS — Z515 Encounter for palliative care: Secondary | ICD-10-CM | POA: Diagnosis not present

## 2023-02-18 LAB — COMPREHENSIVE METABOLIC PANEL
ALT: 94 U/L — ABNORMAL HIGH (ref 0–44)
AST: 145 U/L — ABNORMAL HIGH (ref 15–41)
Albumin: 2 g/dL — ABNORMAL LOW (ref 3.5–5.0)
Alkaline Phosphatase: 274 U/L — ABNORMAL HIGH (ref 38–126)
Anion gap: 11 (ref 5–15)
BUN: 47 mg/dL — ABNORMAL HIGH (ref 8–23)
CO2: 24 mmol/L (ref 22–32)
Calcium: 9.1 mg/dL (ref 8.9–10.3)
Chloride: 95 mmol/L — ABNORMAL LOW (ref 98–111)
Creatinine, Ser: 1.19 mg/dL (ref 0.61–1.24)
GFR, Estimated: >60 mL/min (ref >60)
Glucose, Bld: 108 mg/dL — ABNORMAL HIGH (ref 70–99)
Potassium: 4.7 mmol/L (ref 3.5–5.1)
Sodium: 130 mmol/L — ABNORMAL LOW (ref 135–145)
Total Bilirubin: 10 mg/dL — ABNORMAL HIGH (ref <1.2)
Total Protein: 5.4 g/dL — ABNORMAL LOW (ref 6.5–8.1)

## 2023-02-18 LAB — CULTURE, BLOOD (ROUTINE X 2)
Culture: NO GROWTH
Culture: NO GROWTH

## 2023-02-18 LAB — PSA, TOTAL AND FREE
PSA, Free Pct: 17.4 %
PSA, Free: 0.92 ng/mL
Prostate Specific Ag, Serum: 5.3 ng/mL — ABNORMAL HIGH (ref 0.0–4.0)

## 2023-02-18 LAB — CBC
HCT: 27 % — ABNORMAL LOW (ref 39.0–52.0)
Hemoglobin: 9.2 g/dL — ABNORMAL LOW (ref 13.0–17.0)
MCH: 35.7 pg — ABNORMAL HIGH (ref 26.0–34.0)
MCHC: 34.1 g/dL (ref 30.0–36.0)
MCV: 104.7 fL — ABNORMAL HIGH (ref 80.0–100.0)
Platelets: 291 10*3/uL (ref 150–400)
RBC: 2.58 MIL/uL — ABNORMAL LOW (ref 4.22–5.81)
RDW: 15.1 % (ref 11.5–15.5)
WBC: 8.7 10*3/uL (ref 4.0–10.5)
nRBC: 0 % (ref 0.0–0.2)

## 2023-02-18 NOTE — Progress Notes (Signed)
Biliary dressings checked, dry and intact.

## 2023-02-18 NOTE — Progress Notes (Signed)
PROGRESS NOTE Samuel Mayo  YTK:[id number] DOB: [birth year] DOA: 01/24/2023 PCP: [name], [name], MD  Brief Narrative/Hospital Course: [phone] w/ Hx A-fib on Eliquis, HTN, seizure disorder, MVR, recent acute cholecystitis with abscess s/p cholecystostomy drain on 10/31 , exchanged 11/22 got admitted with obstructive jaundice on 11/22 with persistent occlusion of the cystic duct without opacification of the CBD. Seen by GI s/ p ERCP but unsuccessful and IR did PTC with biliary drain placement on 11/29.  On 12/1 became febrile hypotensive tachycardic-CCM and palliative care consulted. Left sided drain placement by IR on 12/6.  12/6 growing Pseudomonas aeruginosa and blood culture CA 19-9 elevated AFP normal oncology consulted.  Seen by ID advised Levaquin oral on discharge x 10 days  and outpatient follow-up 12/11> Currently with 3 drains: 1 cholecystostomy and 2 biliary drains-flushing once daily. 12/13> s/ p exchange and upsizing to 14 [place] right-sided biliary drain  by IR and LFTs shows TB continues to improve.  Overall hemodynamically stable, repeat blood culture 12/12 no growth to date. IR advised to follow-up in drain clinic 10 to 14 days,Continue flushing drain daily with 5 cc NS. Per oncology no further workup and will plan for outpatient PET scan and further treatment plan for hilar cholangiocarcinoma/Klatskin tumor with obstructive jaundice with bone mets C7,T3.   Subjective: Patient seen and examined  He is alert awake resting comfortably  No new complaints   Assessment and Plan: Principal Problem:   Obstructive jaundice Active Problems:   Atrial fibrillation, chronic (HCC)   Hypertension   Seizure (HCC)   Cholecystitis   Acute biliary pancreatitis   Malnutrition of moderate degree   Hyperbilirubinemia   DNR (do not resuscitate)   DNR (do not resuscitate) discussion   Palliative care by specialist   Weakness generalized   Hilar cholangiocarcinoma/Klatskin tumor   Obstructive jaundice 2/2 above C7/T3 bony mets: S/p ERCP- but unsuccessful then IR placed drains: has 1 cholecystostomy tube and 2 biliary drains  He was thought to have Klatskin tumor for which surgery is not offered here and plan was to refer to [facility] (patient has received a call from [facility]- and was advised to get PET scan first) S/P Exchange and upsizing to 14 [place] right-sided biliary drain 12/12 by IR and LFTs was nicely improving but today up > recheck LFT in the morning to ensure they are stable or improving before discharge  If TB not improving may need further CT/MRCP per IR. Dr Mosetta Putt planning for outpatient PET scan and decide further treatment option . Psa level ordered while here. Overall prognosis appears guarded to poor. Recent Labs  Lab 02/14/23 0201 02/15/23 0203 02/16/23 0213 02/17/23 0205 02/17/23 0901 02/18/23 0212 02/18/23 0642  AST 120* 114* 102* 114*  --   --  145*  ALT 104* 93* 86* 83*  --   --  94*  ALKPHOS 310* 277* 265* 253*  --   --  274*  BILITOT 11.5* 10.1* 9.9* 9.0*  --   --  10.0*  PROT 5.7* 5.1* 5.2* 5.1*  --   --  5.4*  ALBUMIN 1.8* 1.8* 1.8* 1.9*  --   --  2.0*  PLT 507*  --   --   --  359 291  --    Sepsis secondary to cholangitis-febrile on 11/13 with leukocytosis Pseudomonas bacteremia Recent acute cholecystitis with abscess s/p cholecystostomy drain on 10/31 , exchanged 11/22: Seen by ID - cont Levaquin + falgly- cont x10 days  doing ok, no fever Repeat blood  culture ordered 12/12 and NGTD, blood cx + from 12/6  Macrocytic anemia: Hemoglobin remains stable in ~9 gm.Monitor intermittently   Hyponatremia Metabolic acidosis Dehydration AKI-resolved: Electrolytes stable with mild hyponatremia, AKI resolved.Encourage oral intake  Hypertension Hypotension Orthostatic hypotension: BP stable.discontinued home  amlodipine, Toprol and. Cont midodrine, leg compressions  Paroxysmal A-fib: Rate controlled continue Eliquis. holding Toprol  as BP  had been soft   Seizure disorder:  Stable on home Depakote. Elevated liver enzymes is not from Depakote but from obstructive liver disease.   History of mitral valve regurgitation:  S/p annuloplasty.  Follow-up with cardiology as an outpatient.   Goals of care: Palliative care, critical care was involved in the care. Currently patient is DNR, overall prognosis poor. Might be a candidate for hospice/comfort care if further declines.     Deconditioning/debility:  Continue PT OT plan for skilled nursing facility  Insomnia: Cont trazodone at bedtime, cont Remeron (needs rx on d/c)  Severe malnutrition : Augment diet as below  Etiology: acute illness,  Cont  Remeron 7.5 mg daily  DVT prophylaxis: SCDs Start: 01/24/23 1337 Code Status:   Code Status: Limited: Do not attempt resuscitation (DNR) -DNR-LIMITED -Do Not Intubate/DNI  Family Communication: plan of care discussed with patient/wife updated on the phone  Patient status is: Remains hospitalized because of severity of illness Level of care: Progressive  Dispo: The patient is from: home            Anticipated disposition: SNF tomorrow if bed available and LFTs downtrending-discussed with patient's significant other at bedside and are in agreement.  Objective: Vitals last 24 hrs: Vitals:   02/17/23 2315 02/17/23 2336 02/18/23 0341 02/18/23 0844  BP:  98/70 119/84 100/69  Pulse: 85 88 95 82  Resp: 19 19 18 11   Temp:  97.6 F (36.4 C) 98.1 F (36.7 C) 97.6 F (36.4 C)  TempSrc:  Oral Oral Oral  SpO2: 98% 99% 97% 94%  Weight:      Height:       Weight change:   Physical Examination: General exam: alert awake, ill appearing HEENT:Oral mucosa moist, Ear/Nose WNL grossly Respiratory system: Bilaterally clear BS,no use of accessory muscle Cardiovascular system: S1 & S2 +, No JVD. Gastrointestinal system: Abdomen soft,NT,ND, BS+ Nervous System: Alert, awake, moving all extremities,and following commands. Extremities: LE  edema neg,distal peripheral pulses palpable and warm.  Skin: No rashes, ++icterus. MSK: Normal muscle bulk,tone, power   Medications reviewed:  Scheduled Meds:  (feeding supplement) PROSource Plus  30 mL Oral BID BM   sodium chloride   Intravenous Once   apixaban  5 mg Oral BID   divalproex  750 mg Oral BID   feeding supplement  237 mL Oral QID   levofloxacin  750 mg Oral Daily   metroNIDAZOLE  500 mg Oral Q12H   midodrine  10 mg Oral TID WC   multivitamin with minerals  1 tablet Oral Daily   sodium chloride flush  10 mL Intracatheter Q8H   sodium chloride flush  3 mL Intravenous Q12H   sodium chloride flush  5 mL Intracatheter Q8H   sodium chloride flush  5 mL Intracatheter Q8H   traZODone  50 mg Oral QHS   Continuous Infusions:    Diet Order             Diet regular Room service appropriate? Yes; Fluid consistency: Thin  Diet effective now  Intake/Output Summary (Last 24 hours) at 02/18/2023 1114 Last data filed at 02/18/2023 0642 Gross per 24 hour  Intake 292 ml  Output 1610 ml  Net -1318 ml   Net IO Since Admission: -22,284.63 mL [02/18/23 1114]  Wt Readings from Last 3 Encounters:  02/11/23 83.5 kg  01/13/23 85.8 kg  01/03/23 91.3 kg     Unresulted Labs (From admission, onward)     Start     Ordered   02/17/23 0845  PSA, total and free  Once,   R       Question:  Specimen collection method  Answer:  Lab=Lab collect   02/17/23 0844          Data Reviewed: I have personally reviewed following labs and imaging studies CBC: Recent Labs  Lab 02/12/23 0217 02/14/23 0201 02/17/23 0901 02/18/23 0212  WBC  --  12.1* 8.8 8.7  NEUTROABS  --   --  6.1  --   HGB 9.1* 9.9* 9.9* 9.2*  HCT 27.1* 29.7* 30.0* 27.0*  MCV  --  104.9* 106.4* 104.7*  PLT  --  507* 359 291   Basic Metabolic Panel:  Recent Labs  Lab 02/14/23 0201 02/15/23 0203 02/16/23 0213 02/17/23 0205 02/18/23 0642  NA 132* 128* 131* 128* 130*  K 4.3 4.8 4.8 4.6 4.7   CL 98 97* 98 94* 95*  CO2 23 23 25 26 24   GLUCOSE 107* 109* 124* 122* 108*  BUN 39* 47* 45* 41* 47*  CREATININE 1.11 1.19 1.26* 1.11 1.19  CALCIUM 8.9 8.5* 8.8* 8.7* 9.1   GFR: Estimated Creatinine Clearance: 54.5 mL/min (by C-G formula based on SCr of 1.19 mg/dL). Liver Function Tests:  Recent Labs  Lab 02/14/23 0201 02/15/23 0203 02/16/23 0213 02/17/23 0205 02/18/23 0642  AST 120* 114* 102* 114* 145*  ALT 104* 93* 86* 83* 94*  ALKPHOS 310* 277* 265* 253* 274*  BILITOT 11.5* 10.1* 9.9* 9.0* 10.0*  PROT 5.7* 5.1* 5.2* 5.1* 5.4*  ALBUMIN 1.8* 1.8* 1.8* 1.9* 2.0*  Sepsis Labs: No results for input(s): "PROCALCITON", "LATICACIDVEN" in the last 168 hours. Recent Results (from the past 240 hours)  Culture, blood (Routine X 2) w Reflex to ID Panel     Status: None   Collection Time: 02/13/23 11:22 AM   Specimen: BLOOD LEFT HAND  Result Value Ref Range Status   Specimen Description BLOOD LEFT HAND  Final   Special Requests   Final    BOTTLES DRAWN AEROBIC AND ANAEROBIC Blood Culture results may not be optimal due to an inadequate volume of blood received in culture bottles   Culture   Final    NO GROWTH 5 DAYS Performed at [facility], [address], [place], [place] [address]    Report Status 02/18/2023 FINAL  Final  Culture, blood (Routine X 2) w Reflex to ID Panel     Status: None   Collection Time: 02/13/23 11:22 AM   Specimen: BLOOD RIGHT HAND  Result Value Ref Range Status   Specimen Description BLOOD RIGHT HAND  Final   Special Requests   Final    BOTTLES DRAWN AEROBIC AND ANAEROBIC Blood Culture results may not be optimal due to an inadequate volume of blood received in culture bottles   Culture   Final    NO GROWTH 5 DAYS Performed at [facility], [address] [place], [place] [address]    Report Status 02/18/2023 FINAL  Final  MRSA Next Gen by PCR,  Nasal     Status: None   Collection Time: 02/13/23  7:56 PM   Specimen: Nasal Mucosa; Nasal Swab   Result Value Ref Range Status   MRSA by PCR Next Gen NOT DETECTED NOT DETECTED Final    Comment: (NOTE) The GeneXpert MRSA Assay (FDA approved for NASAL specimens only), is one component of a comprehensive MRSA colonization surveillance program. It is not intended to diagnose MRSA infection nor to guide or monitor treatment for MRSA infections. Test performance is not FDA approved in patients less than 95 years old. Performed at [facility], [address] [place], [place] [address]     Antimicrobials/Microbiology: Anti-infectives (From admission, onward)    Start     Dose/Rate Route Frequency Ordered Stop   02/18/23 1000  levofloxacin (LEVAQUIN) tablet 750 mg        750 mg Oral Daily 02/17/23 1522 02/21/23 0959   02/13/23 1640  cefTRIAXone (ROCEPHIN) 1 g in sodium chloride 0.9 % 100 mL IVPB        over 30 Minutes  Continuous PRN 02/13/23 1654 02/13/23 1640   02/10/23 1030  levofloxacin (LEVAQUIN) IVPB 750 mg  Status:  Discontinued        750 mg 100 mL/hr over 90 Minutes Intravenous Every 24 hours 02/10/23 0931 02/17/23 1522   02/10/23 1030  metroNIDAZOLE (FLAGYL) tablet 500 mg        500 mg Oral Every 12 hours 02/10/23 0931 02/20/23 0959   02/07/23 1445  piperacillin-tazobactam (ZOSYN) IVPB 3.375 g  Status:  Discontinued        3.375 g 12.5 mL/hr over 240 Minutes Intravenous Every 8 hours 02/07/23 1347 02/10/23 0931   02/07/23 1002  cefTRIAXone (ROCEPHIN) 2 g in sodium chloride 0.9 % 100 mL IVPB        over 30 Minutes Intravenous Continuous PRN 02/07/23 1011 02/07/23 1017   02/07/23 0945  cefTRIAXone (ROCEPHIN) 2 g in sodium chloride 0.9 % 100 mL IVPB        2 g 200 mL/hr over 30 Minutes Intravenous To Radiology 02/07/23 0845 02/08/23 0945   02/06/23 1000  amoxicillin-clavulanate (AUGMENTIN) 875-125 MG per tablet 1 tablet  Status:  Discontinued        1 tablet Oral Every 12 hours 02/05/23 1108 02/07/23 1347   02/05/23 1000  Vancomycin (VANCOCIN) 1,500 mg in sodium  chloride 0.9 % 500 mL IVPB        1,500 mg 250 mL/hr over 120 Minutes Intravenous Every 24 hours 02/05/23 0903 02/05/23 1229   02/04/23 1000  vancomycin (VANCOCIN) IVPB 1000 mg/200 mL premix  Status:  Discontinued        1,000 mg 200 mL/hr over 60 Minutes Intravenous Every 24 hours 02/03/23 1122 02/05/23 0903   02/03/23 1000  Vancomycin (VANCOCIN) 1,500 mg in sodium chloride 0.9 % 500 mL IVPB  Status:  Discontinued        1,500 mg 250 mL/hr over 120 Minutes Intravenous Every 24 hours 02/02/23 1006 02/03/23 1122   02/02/23 2100  vancomycin (VANCOCIN) IVPB 1000 mg/200 mL premix  Status:  Discontinued        1,000 mg 200 mL/hr over 60 Minutes Intravenous Every 12 hours 02/02/23 0811 02/02/23 1006   02/02/23 1100  vancomycin (VANCOCIN) IVPB 1000 mg/200 mL premix       Placed in "Followed by" Linked Group   1,000 mg 200 mL/hr over 60 Minutes Intravenous  Once 02/02/23 0913 02/02/23 1500   02/02/23 1000  vancomycin (  VANCOCIN) IVPB 1000 mg/200 mL premix       Placed in "Followed by" Linked Group   1,000 mg 200 mL/hr over 60 Minutes Intravenous  Once 02/02/23 0913 02/02/23 1340   02/02/23 0900  vancomycin (VANCOREADY) IVPB 2000 mg/400 mL  Status:  Discontinued        2,000 mg 200 mL/hr over 120 Minutes Intravenous  Once 02/02/23 0804 02/02/23 0913   02/02/23 0900  piperacillin-tazobactam (ZOSYN) IVPB 3.375 g        3.375 g 12.5 mL/hr over 240 Minutes Intravenous Every 8 hours 02/02/23 0805 02/05/23 2359   02/01/23 2200  amoxicillin-clavulanate (AUGMENTIN) 875-125 MG per tablet 1 tablet  Status:  Discontinued        1 tablet Oral Every 12 hours 02/01/23 1150 02/02/23 0738   01/31/23 1058  cefOXitin (MEFOXIN) 2 g in sodium chloride 0.9 % 100 mL IVPB        over 30 Minutes  Continuous PRN 01/31/23 1058 01/31/23 1058   01/31/23 0000  cefOXitin (MEFOXIN) 2 g in sodium chloride 0.9 % 100 mL IVPB  Status:  Discontinued        2 g 200 mL/hr over 30 Minutes Intravenous To Radiology 01/30/23 1014  01/30/23 1015   01/25/23 1600  ampicillin-sulbactam (UNASYN) 1.5 g in sodium chloride 0.9 % 100 mL IVPB  Status:  Discontinued        1.5 g 200 mL/hr over 30 Minutes Intravenous Every 6 hours 01/25/23 1525 02/01/23 1150         Component Value Date/Time   SDES BLOOD LEFT HAND 02/13/2023 1122   SDES BLOOD RIGHT HAND 02/13/2023 1122   SPECREQUEST  02/13/2023 1122    BOTTLES DRAWN AEROBIC AND ANAEROBIC Blood Culture results may not be optimal due to an inadequate volume of blood received in culture bottles   SPECREQUEST  02/13/2023 1122    BOTTLES DRAWN AEROBIC AND ANAEROBIC Blood Culture results may not be optimal due to an inadequate volume of blood received in culture bottles   CULT  02/13/2023 1122    NO GROWTH 5 DAYS Performed at [facility], [address] [place], [place] [address]    CULT  02/13/2023 1122    NO GROWTH 5 DAYS Performed at [facility], [address] [place], [place] [address]    REPTSTATUS 02/18/2023 FINAL 02/13/2023 1122   REPTSTATUS 02/18/2023 FINAL 02/13/2023 1122    Radiology Studies: No results found.   LOS: 23 days   Total time spent in review of labs and imaging, patient evaluation, formulation of plan, documentation and communication with family: 35 minutes  Lanae Boast, MD [facility]  02/18/2023, 11:14 AM

## 2023-02-18 NOTE — TOC CM/SW Note (Signed)
Transition of Care [facility]) - Inpatient Brief Assessment   Patient Details  Name: Samuel Mayo MRN: [id number] Date of Birth: Apr 14, 1945  Transition of [facility]) CM/SW Contact:    Marliss Coots, LCSW Phone Number: 02/18/2023, 12:16 PM   Clinical Narrative:  12:16 PM Insurance authorization (reference # [id number]) has been approved. Authorization expires tomorrow. CSW will coordinate transportation via PTAR once patient is medically ready for discharge to [facility].  Transition of Care Asessment: Insurance and Status: Insurance coverage has been reviewed Patient has primary care physician: Yes Home environment has been reviewed: DC to SNF Prior level of function:: Independent/Modified Independent Prior/Current Home Services: No current home services Social Drivers of Health Review: SDOH reviewed no interventions necessary Readmission risk has been reviewed: Yes Transition of care needs: transition of care needs identified, TOC will continue to follow (PTAR for dc to [facility])

## 2023-02-18 NOTE — Progress Notes (Signed)
Nutrition Follow-up  DOCUMENTATION CODES:   Non-severe (moderate) malnutrition in context of acute illness/injury  INTERVENTION:  Continue regular diet as ordered Assistance with feeding/meal encouragement 30 ml ProSource Plus BID, each supplement provides 100 kcals and 15 grams protein.  Ensure Enlive po QID, each supplement provides 350 kcal and 20 grams of protein. Snacks TID between meals (tuna salad, yogurt, fruit, cereal, jello, pudding) Consider adding bowel regimen given last BM x4 days  NUTRITION DIAGNOSIS:  Severe Malnutrition related to acute illness as evidenced by mild muscle depletion, mild fat depletion. - remains applicable  GOAL:  Patient will meet greater than or equal to 90% of their needs - goal unmet, addressing via meals, ONS and snacks  MONITOR:  PO intake, Supplement acceptance, Labs  REASON FOR ASSESSMENT:  NPO/Clear Liquid Diet    ASSESSMENT:   Patient was recently admitted from 10/28 -11/1 for cholecystitis, diagnosed with Mirizzi syndrome ,underwent cholecystostomy. Readmitted 11/22 due to occlusion of the cystic duct and worsening jaundice. PMH of HTN, Afib, gout, congential mitral valve disease  10/31 - cholecystostomy tube placement  11/22- Readmitted, NPO 11/23 - Clears -> Heart healthy 11/24 - NPO 11/27 - Clears 11/28 - Regular 11/29 - PTC with R side biliary drain placement, NPO -> Regular  12/6 - L side bilary drain placement NPO -> Regular  12/13 - s/p exchange and upsize of R sided biliary drain  PMT following for GOC.  Oncology following. Cytology from biliary biopsy with findings of unresectable hilar cholangiocarcinoma/klatskin tumor/obstructive jaundice with bone mets. Plans to follow pt for ongoing workup after discharge.   Pt resting soundly at time of visit. Pt's significant other present. She reports that his intake is slightly improved but not by much. When she arrived this morning, breakfast tray was on table but had been  untouched. She assisted with feeding him yogurt and 2 fruit cups which he ate 100% of and about 50% of an ensure. She noticed that he is having difficulty self feeding but feels that if help is offered he will accept it otherwise is frustrated by the constant encouragement.   She was initially trying to bring food from home but he was not eating it therefore he is only consuming what is provided from the hospital. He is consuming some of each supplement (ensure, prosource and mighty shakes) but does not consume 100% of each supplement.   Meal completions: 12/14: 50% lunch, 10% dinner 12/15: 50% breakfast, 10% lunch, 10% dinner  Briefly discussed wishes for short versus long term nutrition support if indicated. Pt's significant other reports that they would prefer to avoid nutrition support especially if clinical course indicated palliative treatment.   Admit weight: 86.2 kg Last documented weight 12/10: 83.5 kg  Medications: abx, MVI  Labs: sodium 130, BUN 47, alkaline phos 274, AST 145, LAT 94, Tbili 10.0  Biliary drains: LUQ - x24 hours RUQ (12Fr) - 10ml x24 hours RUQ (14Fr)- x24 hours  Diet Order:   Diet Order             Diet regular Room service appropriate? Yes; Fluid consistency: Thin  Diet effective now                   EDUCATION NEEDS:   Education needs have been addressed  Skin:  Skin Assessment: Skin Integrity Issues: Skin Integrity Issues:: Other (Comment) Other: Jaundice  Last BM:  12/13  Height:   Ht Readings from Last 1 Encounters:  01/24/23 5\' 11"  (1.803  m)    Weight:   Wt Readings from Last 1 Encounters:  02/11/23 83.5 kg    Ideal Body Weight:  78.2 kg  BMI:  Body mass index is 25.66 kg/m.  Estimated Nutritional Needs:   Kcal:  1900-2100 kcal  Protein:  90-100 gm  Fluid:  >1.9 L  Drusilla Kanner, RDN, LDN Clinical Nutrition

## 2023-02-18 NOTE — Progress Notes (Signed)
Verbal order w/readback from Dr. Mosetta Putt for [employer] [facility] testing and PET Scan Initial Staging to be done when pt is d/c from hospital.  Orders placed and [employer] paperwork submitted to [employer] online.  Lab for [employer] is still pending pt's d/c from hospital as well as pt's PET Scan.

## 2023-02-18 NOTE — Progress Notes (Signed)
Occupational Therapy Treatment Patient Details Name: Samuel Mayo MRN: [id number] DOB: 1946-02-12 Today's Date: 02/18/2023   History of present illness 77 y.o. male presents to [facility] 01/24/23 with worsening jaundice and follow up imaging from recent admit that showed non-visualization of CBD. MRI/MRCP showed increased mild to moderate intrahepatic bile duct dilation and new mural thickening and enhancement of the extrahepatic bile duct suspicious for cholangitis. IR performed PTC 11/29 with placement of internal/external drain. 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 Recent admit from 10/28 -11/1 for cholecystitis, diagnosed with Mirizzi syndrome ,underwent cholecystostomy. PMHx: seizure disorder on depakote, PAF, HTN, a-fib, acute biliary pancreatitis, cholecystitis, gout   OT comments  Pt progressing toward established OT goals. Challenging activity tolerance with mobility into hall and pt needing 2 standing rest breaks. Greater step pattern noted today with min cues for optimizing larger stride length. Pt performing LB ADL with set-up. Min cues for attention to task within his room in presence of mod distraction. Will continue to follow. Patient will benefit from continued inpatient follow up therapy, <3 hours/day       If plan is discharge home, recommend the following:  A little help with walking and/or transfers;A little help with bathing/dressing/bathroom;Assistance with cooking/housework;Assist for transportation;Help with stairs or ramp for entrance   Equipment Recommendations  Other (comment) (TBD)    Recommendations for Other Services      Precautions / Restrictions Precautions Precautions: Fall Precaution Comments: biliary tubes x 3 RUQ, orthostatic hypotension Restrictions Weight Bearing Restrictions Per Provider Order: No       Mobility Bed Mobility Overal bed mobility: Needs Assistance Bed Mobility: Supine to Sit     Supine to sit: Contact  guard     General bed mobility comments: increased time    Transfers Overall transfer level: Needs assistance Equipment used: Rolling walker (2 wheels) Transfers: Sit to/from Stand, Bed to chair/wheelchair/BSC Sit to Stand: Contact guard assist           General transfer comment: increased time to implement safety precautions on his own.     Balance Overall balance assessment: Needs assistance Sitting-balance support: Feet supported Sitting balance-Leahy Scale: Good     Standing balance support: Bilateral upper extremity supported Standing balance-Leahy Scale: Fair                             ADL either performed or assessed with clinical judgement   ADL Overall ADL's : Needs assistance/impaired Eating/Feeding: Set up;Sitting   Grooming: Contact guard assist;Standing;Wash/dry face               Lower Body Dressing: Set up;Sitting/lateral leans Lower Body Dressing Details (indicate cue type and reason): to doff socks and don new ones; cues for attention to task with mod distraction in room Toilet Transfer: Contact guard assist;Rolling walker (2 wheels);Cueing for safety           Functional mobility during ADLs: Contact guard assist;Rolling walker (2 wheels)      Extremity/Trunk Assessment Upper Extremity Assessment Upper Extremity Assessment: Generalized weakness   Lower Extremity Assessment Lower Extremity Assessment: Defer to PT evaluation        Vision       Perception     Praxis      Cognition Arousal: Alert Behavior During Therapy: Flat affect Overall Cognitive Status: Impaired/Different from baseline Area of Impairment: Problem solving, Attention  Problem Solving: Slow processing General Comments: Following all commands with increased time. increased time for processing. Easily distracted        Exercises      Shoulder Instructions       General Comments HR up to 111 with  mobility into hall    Pertinent Vitals/ Pain       Pain Assessment Pain Assessment: Faces Faces Pain Scale: No hurt  Home Living                                          Prior Functioning/Environment              Frequency  Min 1X/week        Progress Toward Goals  OT Goals(current goals can now be found in the care plan section)  Progress towards OT goals: Progressing toward goals  Acute Rehab OT Goals Patient Stated Goal: none stated this sessiojn OT Goal Formulation: With patient Time For Goal Achievement: 02/23/23 Potential to Achieve Goals: Good ADL Goals Pt Will Perform Grooming: with modified independence;standing Pt Will Perform Lower Body Dressing: with modified independence;sit to/from stand Pt Will Transfer to Toilet: with modified independence;ambulating Pt/caregiver will Perform Home Exercise Program: Increased strength;With written HEP provided;Both right and left upper extremity Additional ADL Goal #1: Pt will pass short blessed test of cognition  Plan      Co-evaluation                 AM-PAC OT "6 Clicks" Daily Activity     Outcome Measure   Help from another person eating meals?: A Little Help from another person taking care of personal grooming?: A Little Help from another person toileting, which includes using toliet, bedpan, or urinal?: A Little Help from another person bathing (including washing, rinsing, drying)?: A Little Help from another person to put on and taking off regular upper body clothing?: A Little Help from another person to put on and taking off regular lower body clothing?: A Lot 6 Click Score: 17    End of Session Equipment Utilized During Treatment: Gait belt;Rolling walker (2 wheels)  OT Visit Diagnosis: Unsteadiness on feet (R26.81);Muscle weakness (generalized) (M62.81);Other symptoms and signs involving cognitive function   Activity Tolerance Patient tolerated treatment well   Patient  Left in chair;with family/visitor present;Other (comment) (there was no chair alarm in chair; RN notified)   Nurse Communication Mobility status        Time: 6440-3474 OT Time Calculation (min): 29 min  Charges: OT General Charges $OT Visit: 1 Visit OT Treatments $Self Care/Home Management : 8-22 mins $Therapeutic Activity: 8-22 mins  Myrla Halsted, OTD, OTR/L [facility] Acute Rehabilitation Office: [phone]   Myrla Halsted 02/18/2023, 12:14 PM

## 2023-02-18 NOTE — Progress Notes (Signed)
Mobility Specialist Progress Note:    02/18/23 1300  Mobility  Activity Transferred from chair to bed  Level of Assistance Contact guard assist, steadying assist  Assistive Device Front wheel walker  Distance Ambulated (ft) 4 ft  Activity Response Tolerated well  Mobility Referral Yes  Mobility visit 1 Mobility  Mobility Specialist Start Time (ACUTE ONLY) 1212  Mobility Specialist Stop Time (ACUTE ONLY) 1223  Mobility Specialist Time Calculation (min) (ACUTE ONLY) 11 min   Pt received in chair, requesting to get back to bed. Ambulation deferred d/t just walking w/ OT. No complaints throughout. Pt left in bed with call bell and RN present.  Samuel Mayo Mobility Specialist Please contact via Special educational needs teacher or Rehab office at [phone]

## 2023-02-18 NOTE — Progress Notes (Addendum)
Patient ID: Samuel Mayo, male   DOB: 1945-04-09, 77 y.o.   MRN: [id number]    Progress Note from the Palliative Medicine Team at [facility]   Patient Name: Samuel Mayo        Date: 02/18/2023 DOB: 18-Jun-1945  Age: 77 y.o. MRN#: [id number] Attending Physician: Lanae Boast, MD Primary Care Physician: [name], [name], MD Admit Date: 01/24/2023   Reason for Consultation/Follow-up   Establishing Goals of Care   HPI/ Brief Hospital Review  Patient is a 77 year old male with history of hypertension, atrial fibrillation, seizures, gout, MVR s/p angioplasty who presented with worsening jaundice.  Patient was recently admitted from 10/28 -11/1 for cholecystitis, diagnosed with Mirizzi syndrome ,underwent cholecystostomy.  Follow-up imaging showed persistent occlusion of the cystic duct, elevated liver enzymes so recommended to go to the emergency department.  Patient also  reported decreased appetite, weakness.  On presentation, he was hemodynamically stable.  Lab work showed sodium of 127, lipase of 1228, elevated liver enzymes, bilirubin of 20.  General surgery, GI consulted.  Attempted  ERCP but turned out to be unsuccessful.  IR did PTC with biliary drain placement on 11/29.  On 12/1,patient became febrile last night, hypotensive, tachycardic.  Underlying sepsis suspected.  Critical care , palliaitive care consulted.  Now clinically improving.  Current plan is to continue IV antibiotics, follow-up cultures, allowing improvement in the liver function.   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  Patient and family face ongoing treatment option decisions, advanced directive decisions and anticipatory care needs.   Subjective  Extensive chart review has been completed prior to meeting with patient/family  including labs, vital signs, imaging, progress/consult notes, orders, medications and available advance directive  documents.   This NP assessed patient at the bedside as a follow up for palliative medicine needs and emotional support.     Patient's wife at bedside.  Patient is alert and oriented, he continues to work with therapy however he is "tired  Patient continues to struggle with over whelming fatigue and decreased appetite.   Patient is beginning to question how much and for how long he wishes to pursue life-prolonging measures, this has been a hard difficult time for Samuel Mayo  Education offered today regarding  the importance of continued conversation with family and their  medical providers regarding overall plan of care and treatment options,  ensuring decisions are within the context of the patients values and GOCs.  Family request to explore next steps in transition of care.   Meeting scheduled for tomorrow morning at 10 AM with patient, his wife and his son.   PMT will continue to support holistically, family/patient  encouraged to call with  needs.  Questions and concerns addressed     Time: 25  minutes  Detailed review of medical records ( labs, imaging, vital signs), medically appropriate exam ( MS, skin, cardiac,  resp)   discussed with treatment team, counseling and education to patient, family, staff, documenting clinical information, medication management, coordination of care    Lorinda Creed NP  Palliative Medicine Team Team Phone # [phone] Pager [phone]

## 2023-02-19 DIAGNOSIS — C252 Malignant neoplasm of tail of pancreas: Secondary | ICD-10-CM

## 2023-02-19 DIAGNOSIS — K831 Obstruction of bile duct: Secondary | ICD-10-CM | POA: Diagnosis not present

## 2023-02-19 LAB — COMPREHENSIVE METABOLIC PANEL
ALT: 99 U/L — ABNORMAL HIGH (ref 0–44)
AST: 163 U/L — ABNORMAL HIGH (ref 15–41)
Albumin: 2 g/dL — ABNORMAL LOW (ref 3.5–5.0)
Alkaline Phosphatase: 240 U/L — ABNORMAL HIGH (ref 38–126)
Anion gap: 12 (ref 5–15)
BUN: 55 mg/dL — ABNORMAL HIGH (ref 8–23)
CO2: 24 mmol/L (ref 22–32)
Calcium: 8.8 mg/dL — ABNORMAL LOW (ref 8.9–10.3)
Chloride: 93 mmol/L — ABNORMAL LOW (ref 98–111)
Creatinine, Ser: 1.44 mg/dL — ABNORMAL HIGH (ref 0.61–1.24)
GFR, Estimated: 50 mL/min — ABNORMAL LOW (ref >60)
Glucose, Bld: 91 mg/dL (ref 70–99)
Potassium: 5 mmol/L (ref 3.5–5.1)
Sodium: 129 mmol/L — ABNORMAL LOW (ref 135–145)
Total Bilirubin: 9.8 mg/dL — ABNORMAL HIGH (ref <1.2)
Total Protein: 5.2 g/dL — ABNORMAL LOW (ref 6.5–8.1)

## 2023-02-19 MED ORDER — SODIUM CHLORIDE 0.9 % IV SOLN
INTRAVENOUS | Status: AC
Start: 2023-02-19 — End: 2023-02-20

## 2023-02-19 MED ORDER — HYDROMORPHONE HCL 1 MG/ML IJ SOLN
0.5000 mg | INTRAMUSCULAR | Status: DC | PRN
  Administered 2023-02-22: 0.5 mg via INTRAVENOUS
  Filled 2023-02-19: qty 0.5

## 2023-02-19 NOTE — Progress Notes (Signed)
Chief Complaint: Biliary obstruction  Referring Physician: Dr. Mosetta Putt  Supervising Physician: Dr. Lowella Dandy  Patient Status: [facility] - In-pt  History of Present Illness: Samuel Mayo is a 77 y.o. male with history of a fib, HTN, MVR, cholecystitis with abscess. Initial cholecystostomy drain was placed 10/31 and exchanged 11/22. Patient was admitted 11/22 for obstruction jaundice 11/22. Biliary drain placed 11/29 and another biliary drain placed 12/6. Upsize to 14 Fr of the right biliary drain was performed 02/13/23.   Sitting up in bed. Not much appetite. Patient denies abdominal pain or drain site pain/redness.   Past Medical History:  Diagnosis Date   AK (actinic keratosis)    Allergy    Chicken pox    Compression fracture of L1 lumbar vertebra (HCC)    Compression fracture of thoracic vertebra (HCC)    Hypertension    Lentigines    Follows with DR Culton (derma)   Seizures (HCC)     Past Surgical History:  Procedure Laterality Date   CARDIOVERSION N/A 05/14/2021   Procedure: CARDIOVERSION;  Surgeon: Little Ishikawa, MD;  Location: [facility] ENDOSCOPY;  Service: Cardiovascular;  Laterality: N/A;   CARDIOVERSION N/A 10/08/2021   Procedure: CARDIOVERSION;  Surgeon: Christell Constant, MD;  Location: [facility] ENDOSCOPY;  Service: Cardiovascular;  Laterality: N/A;   COLONOSCOPY     ESOPHAGOGASTRODUODENOSCOPY N/A 01/29/2023   Procedure: ESOPHAGOGASTRODUODENOSCOPY (EGD);  Surgeon: Iva Boop, MD;  Location: [facility] ENDOSCOPY;  Service: Gastroenterology;  Laterality: N/A;   IR BILIARY DRAIN PLACEMENT WITH CHOLANGIOGRAM  01/31/2023   IR CHOLANGIOGRAM EXISTING TUBE  02/13/2023   IR ENDOLUMINAL BX OF BILIARY TREE  02/07/2023   IR ENDOLUMINAL BX OF BILIARY TREE  02/07/2023   IR EXCHANGE BILIARY DRAIN  01/24/2023   IR EXCHANGE BILIARY DRAIN  02/07/2023   IR EXCHANGE BILIARY DRAIN  02/13/2023   IR EXCHANGE BILIARY DRAIN  02/13/2023   IR INT EXT BILIARY DRAIN WITH CHOLANGIOGRAM   02/07/2023   IR PERC CHOLECYSTOSTOMY  01/02/2023   mitral vavle     Congetinal mitral valve disease.    Allergies: Other  Medications:  Current Facility-Administered Medications:    (feeding supplement) PROSource Plus liquid 30 mL, 30 mL, Oral, BID BM, Khatri, Pardeep, MD, 30 mL at 02/19/23 1124   0.9 %  sodium chloride infusion (Manually program via Guardrails IV Fluids), , Intravenous, Once, Adhikari, Amrit, MD   0.9 %  sodium chloride infusion, , Intravenous, Continuous, Samtani, Jai-Gurmukh, MD, Last Rate: 40 mL/hr at 02/19/23 1123, New Bag at 02/19/23 1123   acetaminophen (TYLENOL) tablet 650 mg, 650 mg, Oral, Q4H PRN, Burnadette Pop, MD, 650 mg at 02/15/23 2304   apixaban (ELIQUIS) tablet 5 mg, 5 mg, Oral, BID, Adhikari, Amrit, MD, 5 mg at 02/19/23 1123   diclofenac Sodium (VOLTAREN) 1 % topical gel 2 g, 2 g, Topical, QID PRN, Anthoney Harada, NP   divalproex (DEPAKOTE) DR tablet 750 mg, 750 mg, Oral, BID, Synetta Fail, MD, 750 mg at 02/19/23 1124   feeding supplement (ENSURE ENLIVE / ENSURE PLUS) liquid 237 mL, 237 mL, Oral, QID, Kc, Ramesh, MD, 237 mL at 02/19/23 1000   HYDROmorphone (DILAUDID) injection 0.5 mg, 0.5 mg, Intravenous, Q4H PRN, Mahala Menghini, Jai-Gurmukh, MD   levofloxacin (LEVAQUIN) tablet 750 mg, 750 mg, Oral, Daily, Kc, Ramesh, MD, 750 mg at 02/19/23 1123   melatonin tablet 3 mg, 3 mg, Oral, QHS PRN, Opyd, Lavone Neri, MD, 3 mg at 02/18/23 2053   metroNIDAZOLE (FLAGYL) tablet 500 mg, 500 mg,  Oral, Q12H, Gardiner Barefoot, MD, 500 mg at 02/19/23 1124   midodrine (PROAMATINE) tablet 10 mg, 10 mg, Oral, TID WC, Adhikari, Amrit, MD, 10 mg at 02/19/23 1123   multivitamin with minerals tablet 1 tablet, 1 tablet, Oral, Daily, Adhikari, Amrit, MD, 1 tablet at 02/19/23 1123   ondansetron (ZOFRAN) injection 4 mg, 4 mg, Intravenous, Q6H PRN, Kc, Ramesh, MD, 4 mg at 02/14/23 0750   polyethylene glycol (MIRALAX / GLYCOLAX) packet 17 g, 17 g, Oral, Daily PRN, Synetta Fail,  MD   sodium chloride flush (NS) 0.9 % injection 10 mL, 10 mL, Intracatheter, Q8H, Simonne Come, MD, 10 mL at 02/19/23 0538   sodium chloride flush (NS) 0.9 % injection 3 mL, 3 mL, Intravenous, Q12H, Synetta Fail, MD, 3 mL at 02/19/23 1000   sodium chloride flush (NS) 0.9 % injection 5 mL, 5 mL, Intracatheter, Q8H, Wagner, Jaime, DO, 5 mL at 02/19/23 0539   sodium chloride flush (NS) 0.9 % injection 5 mL, 5 mL, Intracatheter, Q8H, Mir, Al Corpus, MD, 5 mL at 02/19/23 0539   traMADol (ULTRAM) tablet 50 mg, 50 mg, Oral, Q6H PRN, Kc, Ramesh, MD, 50 mg at 02/18/23 2052   traZODone (DESYREL) tablet 50 mg, 50 mg, Oral, QHS, Kc, Ramesh, MD, 50 mg at 02/18/23 2053    Family History  Problem Relation Age of Onset   Heart disease Mother    Cancer Father    Heart disease Father    Prostate cancer Father    Aneurysm Paternal Grandfather    Heart disease Son        congenital mitral valve dz   Colon cancer Neg Hx    Esophageal cancer Neg Hx    Stomach cancer Neg Hx    Rectal cancer Neg Hx     Social History   Socioeconomic History   Marital status: Domestic Partner    Spouse name: [name]   Number of children: Not on file   Years of education: Not on file   Highest education level: Bachelor's degree (e.g., BA, AB, BS)  Occupational History   Not on file  Tobacco Use   Smoking status: Never   Smokeless tobacco: Never   Tobacco comments:    Never smoke 10/29/21   Vaping Use   Vaping status: Never Used  Substance and Sexual Activity   Alcohol use: Yes    Alcohol/week: 14.0 standard drinks of alcohol    Types: 14 Standard drinks or equivalent per week    Comment: 2 glasses of wine nightly 10/29/21   Drug use: No   Sexual activity: Not on file  Other Topics Concern   Not on file  Social History Narrative   Lives with wife [name]   R handed   Caffeine: 3 C of coffee AM   Social Drivers of Health   Financial Resource Strain: Low Risk  (10/21/2022)   Overall Financial Resource  Strain (CARDIA)    Difficulty of Paying Living Expenses: Not hard at all  Food Insecurity: No Food Insecurity (01/25/2023)   Hunger Vital Sign    Worried About Running Out of Food in the Last Year: Never true    Ran Out of Food in the Last Year: Never true  Transportation Needs: No Transportation Needs (01/25/2023)   PRAPARE - Administrator, Civil Service (Medical): No    Lack of Transportation (Non-Medical): No  Physical Activity: Insufficiently Active (10/21/2022)   Exercise Vital Sign    Days of Exercise  per Week: 3 days    Minutes of Exercise per Session: 40 min  Stress: Stress Concern Present (10/21/2022)   [employer] of Occupational Health - Occupational Stress Questionnaire    Feeling of Stress : Rather much  Social Connections: Moderately Integrated (10/21/2022)   Social Connection and Isolation Panel [NHANES]    Frequency of Communication with Friends and Family: Three times a week    Frequency of Social Gatherings with Friends and Family: Once a week    Attends Religious Services: Never    Database administrator or Organizations: No    Attends Engineer, structural: More than 4 times per year    Marital Status: Living with partner    Review of Systems: A 12 point ROS discussed and pertinent positives are indicated in the HPI above.  All other systems are negative.  Review of Systems  Constitutional:  Negative for fever.  Gastrointestinal:  Negative for abdominal pain.  Skin:  Negative for color change.  Psychiatric/Behavioral:  Negative for confusion.     Vital Signs: BP 100/73 (BP Location: Right Arm)   Pulse 94   Temp 97.8 F (36.6 C) (Oral)   Resp 19   Ht 5\' 11"  (1.803 m)   Wt 184 lb (83.5 kg)   SpO2 98%   BMI 25.66 kg/m      Physical Exam HENT:     Head: Normocephalic.  Pulmonary:     Effort: Pulmonary effort is normal.  Abdominal:     Palpations: Abdomen is soft.     Comments: All drains intact, sites clean. Chole drain  with minimal output Bili drains intact with continued bilious output, LUQ drain with more output that RUQ drain    Skin:    Coloration: Skin is jaundiced.  Neurological:     Mental Status: He is alert and oriented to person, place, and time.  Psychiatric:        Thought Content: Thought content normal.        Judgment: Judgment normal.      Labs:  CBC: Recent Labs    02/11/23 0207 02/12/23 0217 02/14/23 0201 02/17/23 0901 02/18/23 0212  WBC 8.9  --  12.1* 8.8 8.7  HGB 8.1* 9.1* 9.9* 9.9* 9.2*  HCT 23.8* 27.1* 29.7* 30.0* 27.0*  PLT 328  --  507* 359 291    COAGS: Recent Labs    12/30/22 2151 12/31/22 0705 12/31/22 1747 01/01/23 1154 01/25/23 0548 01/31/23 0516  INR 1.3*  --   --   --  1.2 1.1  APTT 36 34 61* 43*  --   --     BMP: Recent Labs    02/16/23 0213 02/17/23 0205 02/18/23 0642 02/19/23 0220  NA 131* 128* 130* 129*  K 4.8 4.6 4.7 5.0  CL 98 94* 95* 93*  CO2 25 26 24 24   GLUCOSE 124* 122* 108* 91  BUN 45* 41* 47* 55*  CALCIUM 8.8* 8.7* 9.1 8.8*  CREATININE 1.26* 1.11 1.19 1.44*  GFRNONAA 58* >60 >60 50*    LIVER FUNCTION TESTS: Recent Labs    02/16/23 0213 02/17/23 0205 02/18/23 0642 02/19/23 0220  BILITOT 9.9* 9.0* 10.0* 9.8*  AST 102* 114* 145* 163*  ALT 86* 83* 94* 99*  ALKPHOS 265* 253* 274* 240*  PROT 5.2* 5.1* 5.4* 5.2*  ALBUMIN 1.8* 1.9* 2.0* 2.0*     TUMOR MARKERS: No results for input(s): "AFPTM", "CEA", "CA199", "CHROMGRNA" in the last 8760 hours.  Assessment and Plan:  Drain Location: RUQ Size: Fr size: 12 Fr Date of placement: 01/02/23 Currently to: Drain collection device: gravity 24 hour output:  Output by Drain (mL) 02/17/23 0701 - 02/17/23 1900 02/17/23 1901 - 02/18/23 0700 02/18/23 0701 - 02/18/23 1900 02/18/23 1901 - 02/19/23 0700 02/19/23 0701 - 02/19/23 1308  Biliary Tube Cook slip-coat 12 Fr. RUQ 10 0 5 25   Biliary Tube Cook slip-coat 12 Fr. LUQ 200 400 150 275   Biliary Tube Cook slip-coat 14 Fr.  RUQ 100 250 100 175    Drain Location: RUQ Size: Fr size: 14 Fr Date of placement: 01/31/23  Currently to: Drain collection device: gravity 24 hour output:  Output by Drain (mL) 02/17/23 0701 - 02/17/23 1900 02/17/23 1901 - 02/18/23 0700 02/18/23 0701 - 02/18/23 1900 02/18/23 1901 - 02/19/23 0700 02/19/23 0701 - 02/19/23 1308  Biliary Tube Cook slip-coat 12 Fr. RUQ 10 0 5 25   Biliary Tube Cook slip-coat 12 Fr. LUQ 200 400 150 275   Biliary Tube Cook slip-coat 14 Fr. RUQ 100 250 100 175     Drain Location: LUQ Size: Fr size: 12 Fr Date of placement: 02/07/23  Currently to: Drain collection device: gravity 24 hour output:  Output by Drain (mL) 02/17/23 0701 - 02/17/23 1900 02/17/23 1901 - 02/18/23 0700 02/18/23 0701 - 02/18/23 1900 02/18/23 1901 - 02/19/23 0700 02/19/23 0701 - 02/19/23 1308  Biliary Tube Cook slip-coat 12 Fr. RUQ 10 0 5 25   Biliary Tube Cook slip-coat 12 Fr. LUQ 200 400 150 275   Biliary Tube Cook slip-coat 14 Fr. RUQ 100 250 100 175      Plan: Bili continues to plateau despite exchanges and upsizes. No new recs at this time. Family and pt to discuss transition of care with Palliative team IR following    Electronically Signed: Brayton El, PA-C 02/19/2023, 1:08 PM   I spent a total of 25 minutes at the patient's bedside AND on the patient's hospital floor or unit, greater than 50% of which was counseling/coordinating care for biliary obstruction

## 2023-02-19 NOTE — Plan of Care (Signed)

## 2023-02-19 NOTE — Progress Notes (Signed)
Physical Therapy Treatment Patient Details Name: Samuel Mayo MRN: [id number] DOB: 1946-02-04 Today's Date: 02/19/2023   History of Present Illness 77 y.o. male presents to [facility] 01/24/23 with worsening jaundice and follow up imaging from recent admit that showed non-visualization of CBD. MRI/MRCP showed increased mild to moderate intrahepatic bile duct dilation and new mural thickening and enhancement of the extrahepatic bile duct suspicious for cholangitis. IR performed PTC 11/29 with placement of internal/external drain. 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 Recent admit from 10/28 -11/1 for cholecystitis, diagnosed with Mirizzi syndrome ,underwent cholecystostomy. PMHx: seizure disorder on depakote, PAF, HTN, a-fib, acute biliary pancreatitis, cholecystitis, gout    PT Comments  Pt making slow, steady progress with mobility. Continues to fatigue fairly quickly with activity but ambulation distance is improving. Patient will benefit from continued inpatient follow up therapy, <3 hours/day     If plan is discharge home, recommend the following: A little help with walking and/or transfers;Help with stairs or ramp for entrance;Assist for transportation   Can travel by private vehicle     Yes  Equipment Recommendations  None recommended by PT    Recommendations for Other Services       Precautions / Restrictions Precautions Precautions: Fall Precaution Comments: biliary tubes x 3 RUQ, orthostatic hypotension Restrictions Weight Bearing Restrictions Per Provider Order: No     Mobility  Bed Mobility Overal bed mobility: Needs Assistance Bed Mobility: Supine to Sit, Sit to Supine     Supine to sit: Contact guard, HOB elevated Sit to supine: Contact guard assist   General bed mobility comments: increased time    Transfers Overall transfer level: Needs assistance Equipment used: Rolling walker (2 wheels) Transfers: Sit to/from Stand, Bed to  chair/wheelchair/BSC Sit to Stand: Contact guard assist, From elevated surface           General transfer comment: Assist for safety and lines. Incr time and bed elevated    Ambulation/Gait Ambulation/Gait assistance: Contact guard assist Gait Distance (Feet): 160 Feet Assistive device: Rolling walker (2 wheels) Gait Pattern/deviations: Step-through pattern, Decreased step length - right, Decreased step length - left, Shuffle Gait velocity: decreased Gait velocity interpretation: <1.8 ft/sec, indicate of risk for recurrent falls   General Gait Details: Assist for safety   Stairs             Wheelchair Mobility     Tilt Bed    Modified Rankin (Stroke Patients Only)       Balance Overall balance assessment: Needs assistance Sitting-balance support: Feet supported Sitting balance-Leahy Scale: Good     Standing balance support: No upper extremity supported, Bilateral upper extremity supported Standing balance-Leahy Scale: Fair                              Cognition Arousal: Alert Behavior During Therapy: Flat affect Overall Cognitive Status: Impaired/Different from baseline Area of Impairment: Problem solving, Attention                             Problem Solving: Slow processing          Exercises      General Comments General comments (skin integrity, edema, etc.): VSS on RA      Pertinent Vitals/Pain Pain Assessment Faces Pain Scale: No hurt    Home Living  Prior Function            PT Goals (current goals can now be found in the care plan section) Progress towards PT goals: Progressing toward goals    Frequency    Min 1X/week      PT Plan      Co-evaluation              AM-PAC PT "6 Clicks" Mobility   Outcome Measure  Help needed turning from your back to your side while in a flat bed without using bedrails?: A Little Help needed moving from lying on your  back to sitting on the side of a flat bed without using bedrails?: A Little Help needed moving to and from a bed to a chair (including a wheelchair)?: A Little Help needed standing up from a chair using your arms (e.g., wheelchair or bedside chair)?: A Little Help needed to walk in hospital room?: A Little Help needed climbing 3-5 steps with a railing? : A Lot 6 Click Score: 17    End of Session   Activity Tolerance: Patient tolerated treatment well Patient left: in bed;with call bell/phone within reach;with bed alarm set Nurse Communication: Mobility status PT Visit Diagnosis: Other abnormalities of gait and mobility (R26.89);Muscle weakness (generalized) (M62.81)     Time: 1205-1226 PT Time Calculation (min) (ACUTE ONLY): 21 min  Charges:    $Gait Training: 8-22 mins PT General Charges $$ ACUTE PT VISIT: 1 Visit                     [facility] PT Acute Rehabilitation Services Office [phone]    Angelina Ok [facility] 02/19/2023, 2:15 PM

## 2023-02-19 NOTE — Progress Notes (Signed)
Patient ID: Samuel Mayo, male   DOB: 08/10/1945, 77 y.o.   MRN: [id number]    Progress Note from the Palliative Medicine Team at [facility]   Patient Name: Samuel Mayo        Date: 02/19/2023 DOB: 1945/03/21  Age: 77 y.o. MRN#: [id number] Attending Physician: [name], MD Primary Care Physician: [name], [name], MD Admit Date: 01/24/2023   Reason for Consultation/Follow-up   Establishing Goals of Care   HPI/ Brief Hospital Review  Patient is a 77 year old male with history of hypertension, atrial fibrillation, seizures, gout, MVR s/p angioplasty who presented with worsening jaundice.  Patient was recently admitted from 10/28 -11/1 for cholecystitis, diagnosed with Mirizzi syndrome ,underwent cholecystostomy.  Follow-up imaging showed persistent occlusion of the cystic duct, elevated liver enzymes so recommended to go to the emergency department.  Patient also  reported decreased appetite, weakness.  On presentation, he was hemodynamically stable.  Lab work showed sodium of 127, lipase of 1228, elevated liver enzymes, bilirubin of 20.  General surgery, GI consulted.  Attempted  ERCP but turned out to be unsuccessful.  IR did PTC with biliary drain placement on 11/29.  On 12/1,patient became febrile last night, hypotensive, tachycardic.  Underlying sepsis suspected.  Critical care , palliaitive care consulted. Clinically stabilizing              Recent consult with [facility] oncology, biliary brushings show adenocarcinoma which is unresectable.  Patient and family face ongoing treatment option decisions, advanced directive decisions and anticipatory care needs.   Subjective  Extensive chart review has been completed prior to meeting with patient/family  including labs, vital signs, imaging, progress/consult notes, orders, medications and available advance directive documents.   This NP assessed patient at the bedside as a follow up for palliative medicine needs  and emotional support, and to meet with wife and son at the bedside as scheduled for ongoing education and discussion regarding current medical situation and next steps in treatment plan.   Patient is OOB to chair he is alert and oriented, he appears tired  and dozes off to sleep intermittently.  He  continues  work with therapies, but  struggles with over whelming fatigue and decreased appetite.   Plan at this time is to access SNF for rehabilitation.  Hope is that he will continue to improve, gain strength and be  able to access treatment for his cancer diagnosis.  Lots of frustration around scheduling of PET scan.  [facility] contacted and they will schedule scan  once he is discharged.   Patient is beginning to question how much and for how long he wishes to pursue life-prolonging measures, this has been a hard difficult time for Samuel Mayo and his family.  All understand the seriousness of his disease and the real possibility  of a long term poor prognosis.  We explored concepts specific to human mortality and adult failure to thrive,  and the limitations of medical interventions to prolong quality of life when a body does fail to thrive.  Education offered on the difference between a full medical support path and a  palliative, comfort path.    MOST form reviewed in detail.   Education offered on hospice benefit; philosophy and eligibility.  Discussed services both in the home and residential facility    Education offered today regarding  the importance of continued conversation with family and the  medical providers regarding overall plan of care and treatment options,  ensuring decisions are within the  context of the patients values and GOCs.  Family was able to express their support for whatever decisions Samuel Mayo makes for himself at this time in his life.  PMT will continue to support holistically, family/patient  encouraged to call with  needs.  Questions and concerns addressed      Time: 90  minutes  Detailed review of medical records ( labs, imaging, vital signs), medically appropriate exam ( MS, skin, cardiac,  resp)   discussed with treatment team, counseling and education to patient, family, staff, documenting clinical information, medication management, coordination of care    Lorinda Creed NP  Palliative Medicine Team Team Phone # [phone] Pager [phone]

## 2023-02-19 NOTE — Progress Notes (Signed)
HOSPITALIST ROUNDING NOTE Samuel Mayo KGU:[id number]  DOB: [birth year]  DOA: 01/24/2023  PCP: [name], [name], MD  02/19/2023,10:34 AM   LOS: 24 days      Code Status: DNR From: Home  current Dispo: Unclear     77 year old male Known mitral regurgitation status post annuloplasty, atrial flutter fib on Eliquis CHADVASC >4 HTN Seizures Diagnosed with acute cholecystitis 12/30/2022 had a RUQ drain placed on 10/21-was sent home on Augmentin and discharged 11/1 He followed up with general surgeon and he also had an endoscopy secondary to persistently elevated LFT and was --- he was evaluated by IR on 11/22 for reevaluation of gallbladder issues-and during imaging he was noted to have persistent occlusion of cystic duct without opacification 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 and patient was told to come to the emergency room 11/20 3 GI consulted 11/27 ERCP unsuccessful unable to enter bile duct 11/29 IR placed biliary drain concern for Klatskin's tumor 11/28 nephrology consulted for hyponatremia workup consistent with SIADH 11/30 seen by general surgery recommended to go to [facility] hepatobiliary service 12/1 hypotension-palliative care consulted 12/6 left-sided drain placed by IR growing Pseudomonas blood culture  12/9 oncology consulted secondary to CA 19-9 939- 12/12 upsize of drain right-sided biliary LFTs   Plan   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 Mosetta Putt following here--- plan is for outpatient PET/bone scan-if bone scan shows mets then cancer is not curable but may be controllable he has very limited treatment options Overall prognosis guarded Pain control tramadol 50 every 6 as needed moderate pain, Dilaudid 0.5 every 4 as needed severe pain  Sepsis secondary to cholangitis with Pseudomonas bacteremia and recent acute cholecystitis with cholecystotomy drain exchanged 11/22 Completes Levaquin/Flagyl 12/18-blood culture 12/12 no growth  to date and prior 112/6 negative  Hyponatremia mild hyperkalemia AKI Unfortunately slightly worse kidney function--- previously was likely hemodynamically mediated Watch creatinine but start fluids at 40 cc/H  Paroxysmal A-fib Mitral valve regurg Rate control on hold-Eliquis resumed at dose of 5 twice daily-watch for bleeding Orthostatic hypotension in the setting of hypertension-Home meds Toprol-XL 25 amlodipine 5 etc. are all held Currently on midodrine 10 3 times daily meals  Previous seizure disorder Continues on Depakote not felt to be secondary to Depakote but continues on Depakote DR 750 twice daily No actual seizures here   DVT prophylaxis: Apixaban  Status is: Inpatient Remains inpatient appropriate because:   Await improvement of kidney function    Subjective: Only drinking not really eating No nausea-some gas no stool Mild abdominal pain with 3 drains in place 14 [place] is draining well   Objective + exam Vitals:   02/18/23 1955 02/18/23 2355 02/19/23 0352 02/19/23 0800  BP: 105/76 92/67 102/68 107/70  Pulse: 84 89 85 78  Resp: 19 14 20 18   Temp: 97.9 F (36.6 C) 98.2 F (36.8 C) 97.7 F (36.5 C) 97.7 F (36.5 C)  TempSrc: Oral Oral Oral Oral  SpO2: 98% 99% 98%   Weight:      Height:       Filed Weights   01/24/23 0953 02/05/23 0223 02/11/23 1055  Weight: 86.2 kg 82.5 kg 83.5 kg    Examination:  EOMI icteric white male no distress Chest clear no wheeze rales rhonchi S1-S2 no murmur no rub no gallop Abdomen slightly distended 3 drains in place cholecystotomy 14 [place] as well as central abdominal drain  Data Reviewed: reviewed   CBC    Component Value Date/Time   WBC  8.7 02/18/2023 0212   RBC 2.58 (L) 02/18/2023 0212   HGB 9.2 (L) 02/18/2023 0212   HGB 13.6 05/20/2022 1026   HCT 27.0 (L) 02/18/2023 0212   HCT 39.3 05/20/2022 1026   PLT 291 02/18/2023 0212   PLT 201 05/20/2022 1026   MCV 104.7 (H) 02/18/2023 0212   MCV 98 (H)  05/20/2022 1026   MCV 96 02/03/2012 1915   MCH 35.7 (H) 02/18/2023 0212   MCHC 34.1 02/18/2023 0212   RDW 15.1 02/18/2023 0212   RDW 11.9 05/20/2022 1026   RDW 12.8 02/03/2012 1915   LYMPHSABS 1.1 02/17/2023 0901   MONOABS 1.1 (H) 02/17/2023 0901   EOSABS 0.1 02/17/2023 0901   BASOSABS 0.1 02/17/2023 0901      Latest Ref Rng & Units 02/19/2023    2:20 AM 02/18/2023    6:42 AM 02/17/2023    2:05 AM  CMP  Glucose 70 - 99 mg/dL 91  782  956   BUN 8 - 23 mg/dL 55  47  41   Creatinine 0.61 - 1.24 mg/dL 2.13  0.86  5.78   Sodium 135 - 145 mmol/L 129  130  128   Potassium 3.5 - 5.1 mmol/L 5.0  4.7  4.6   Chloride 98 - 111 mmol/L 93  95  94   CO2 22 - 32 mmol/L 24  24  26    Calcium 8.9 - 10.3 mg/dL 8.8  9.1  8.7   Total Protein 6.5 - 8.1 g/dL 5.2  5.4  5.1   Total Bilirubin <1.2 mg/dL 9.8  46.9  9.0   Alkaline Phos 38 - 126 U/L 240  274  253   AST 15 - 41 U/L 163  145  114   ALT 0 - 44 U/L 99  94  83      Scheduled Meds:  (feeding supplement) PROSource Plus  30 mL Oral BID BM   sodium chloride   Intravenous Once   apixaban  5 mg Oral BID   divalproex  750 mg Oral BID   feeding supplement  237 mL Oral QID   levofloxacin  750 mg Oral Daily   metroNIDAZOLE  500 mg Oral Q12H   midodrine  10 mg Oral TID WC   multivitamin with minerals  1 tablet Oral Daily   sodium chloride flush  10 mL Intracatheter Q8H   sodium chloride flush  3 mL Intravenous Q12H   sodium chloride flush  5 mL Intracatheter Q8H   sodium chloride flush  5 mL Intracatheter Q8H   traZODone  50 mg Oral QHS   Continuous Infusions:  Time 46  [name], MD  [facility]

## 2023-02-19 NOTE — TOC Progression Note (Signed)
Transition of Care [facility]) - Progression Note    Patient Details  Name: Samuel Mayo MRN: [id number] Date of Birth: 06-29-1945  Transition of Care [facility]) CM/SW Contact  Marliss Coots, LCSW Phone Number: 02/19/2023, 2:02 PM  Clinical Narrative:     2:02 PM Per Palliative Care NP, CSW called patient's significant other, Tammy Sours, to follow up on concerns regarding SNF. Nicholos Johns informed CSW that they were told [facility] not have a bed for patient's discharge and inquired about other SNF options. CSW clarified that patient will have a bed at [facility] once medically stable for discharge. Nicholos Johns expressed understanding of this. [facility] confirmed they would have a bed tomorrow if patient discharges then. CSW renwed insurance authorization [id number]) as it expired. New insurance authorization is pending.   Expected Discharge Plan: Skilled Nursing Facility Barriers to Discharge: English as a second language teacher, Continued Medical Work up  Expected Discharge Plan and Services In-house Referral: Clinical Social Work Discharge Planning Services: [employer] Consult Post Acute Care Choice: Skilled Nursing Facility Living arrangements for the past 2 months: Single Family Home                                       Social Determinants of Health (SDOH) Interventions SDOH Screenings   Food Insecurity: No Food Insecurity (01/25/2023)  Housing: Patient Declined (01/25/2023)  Transportation Needs: No Transportation Needs (01/25/2023)  Utilities: Not At Risk (01/25/2023)  Alcohol Screen: Low Risk  (10/21/2022)  Depression (PHQ2-9): High Risk (01/13/2023)  Financial Resource Strain: Low Risk  (10/21/2022)  Physical Activity: Insufficiently Active (10/21/2022)  Social Connections: Moderately Integrated (10/21/2022)  Stress: Stress Concern Present (10/21/2022)  Tobacco Use: Low Risk  (01/31/2023)    Readmission Risk Interventions     No data to display

## 2023-02-20 DIAGNOSIS — K831 Obstruction of bile duct: Secondary | ICD-10-CM | POA: Diagnosis not present

## 2023-02-20 LAB — COMPREHENSIVE METABOLIC PANEL
ALT: 91 U/L — ABNORMAL HIGH (ref 0–44)
AST: 144 U/L — ABNORMAL HIGH (ref 15–41)
Albumin: 2 g/dL — ABNORMAL LOW (ref 3.5–5.0)
Alkaline Phosphatase: 235 U/L — ABNORMAL HIGH (ref 38–126)
Anion gap: 10 (ref 5–15)
BUN: 57 mg/dL — ABNORMAL HIGH (ref 8–23)
CO2: 26 mmol/L (ref 22–32)
Calcium: 8.5 mg/dL — ABNORMAL LOW (ref 8.9–10.3)
Chloride: 93 mmol/L — ABNORMAL LOW (ref 98–111)
Creatinine, Ser: 1.3 mg/dL — ABNORMAL HIGH (ref 0.61–1.24)
GFR, Estimated: 56 mL/min — ABNORMAL LOW (ref >60)
Glucose, Bld: 127 mg/dL — ABNORMAL HIGH (ref 70–99)
Potassium: 4.8 mmol/L (ref 3.5–5.1)
Sodium: 129 mmol/L — ABNORMAL LOW (ref 135–145)
Total Bilirubin: 8.9 mg/dL — ABNORMAL HIGH (ref <1.2)
Total Protein: 5.1 g/dL — ABNORMAL LOW (ref 6.5–8.1)

## 2023-02-20 MED ORDER — SODIUM CHLORIDE 0.9 % IV SOLN
INTRAVENOUS | Status: DC
Start: 2023-02-20 — End: 2023-02-21

## 2023-02-20 NOTE — Progress Notes (Signed)
HOSPITALIST ROUNDING NOTE Samuel Mayo GEX:[id number]  DOB: [birth year]  DOA: 01/24/2023  PCP: [name], [name], MD  02/20/2023,1:16 PM   LOS: 25 days      Code Status: DNR From: Home  current Dispo: Unclear     77 year old male Known mitral regurgitation status post annuloplasty, atrial flutter fib on Eliquis CHADVASC >4 HTN Seizures Diagnosed with acute cholecystitis 12/30/2022 had a RUQ drain placed on 10/21-was sent home on Augmentin and discharged 11/1 He followed up with general surgeon and he also had an endoscopy secondary to persistently elevated LFT and was --- he was evaluated by IR on 11/22 for reevaluation of gallbladder issues-and during imaging he was noted to have persistent occlusion of cystic duct without opacification 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 and patient was told to come to the emergency room 11/20 3 GI consulted 11/27 ERCP unsuccessful unable to enter bile duct 11/29 IR placed biliary drain concern for Klatskin's tumor 11/28 nephrology consulted for hyponatremia workup consistent with SIADH 11/30 seen by general surgery recommended to go to [facility] hepatobiliary service 12/1 hypotension-palliative care consulted 12/6 left-sided drain placed by IR growing Pseudomonas blood culture  12/9 oncology consulted secondary to CA 19-9 939- 12/12 upsize of drain right-sided biliary LFTs   Plan  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 Mosetta Putt to follow may need second opinion at [facility] Pain control tramadol every 6 as needed moderate, Dilaudid IV for severe pain 0.5 every 4  Sepsis on admission secondary to cholangitis with Pseudomonas bacteremia Recent acute cholecystitis with cholecystotomy and drain upsize 11/22 Completed Levaquin Flagyl 12/18 Blood culture X212/12 was negative and cleared positive Pseudomonas blood cultures from 12/6  AKI Kidney function remains low using encouraged to drink more fluids Increase  saline rate to 75 cc/H and if closer to normal with p.o. intake as well likely can discharge to skilled facility  Paroxysmal A-fib CHADVASC >4 mitral valve regurg Orthostatic hypotension-now on midodrine 10 3 times daily Hypotension precludes GDMT and patient has been held off of metoprolol XL 25 and amlodipine 5  Previous seizure disorder Continues on Depakote 750 twice daily   DVT prophylaxis: Apixaban  Status is: Inpatient Remains inpatient appropriate because:   Await improvement of kidney function    Subjective:  Eating some drinking more No distress No pain no fever no nausea Was walking in the hallway earlier today   Objective + exam Vitals:   02/19/23 2341 02/20/23 0329 02/20/23 0736 02/20/23 1049  BP: 91/65 97/63 (!) 92/59 101/75  Pulse:      Resp: 16 18 18 12   Temp: 97.8 F (36.6 C) 97.9 F (36.6 C) 97.8 F (36.6 C) 97.8 F (36.6 C)  TempSrc:  Oral Oral Oral  SpO2: 98% 98% 98% 98%  Weight:      Height:       Filed Weights   01/24/23 0953 02/05/23 0223 02/11/23 1055  Weight: 86.2 kg 82.5 kg 83.5 kg    Examination:  Icteric white male no distress Chest clear no wheeze Abdomen soft-did not do full exam today ROM intact No lower extremity edema Neuro intact  Data Reviewed: reviewed   CBC    Component Value Date/Time   WBC 8.7 02/18/2023 0212   RBC 2.58 (L) 02/18/2023 0212   HGB 9.2 (L) 02/18/2023 0212   HGB 13.6 05/20/2022 1026   HCT 27.0 (L) 02/18/2023 0212   HCT 39.3 05/20/2022 1026   PLT 291 02/18/2023 0212   PLT 201 05/20/2022 1026  MCV 104.7 (H) 02/18/2023 0212   MCV 98 (H) 05/20/2022 1026   MCV 96 02/03/2012 1915   MCH 35.7 (H) 02/18/2023 0212   MCHC 34.1 02/18/2023 0212   RDW 15.1 02/18/2023 0212   RDW 11.9 05/20/2022 1026   RDW 12.8 02/03/2012 1915   LYMPHSABS 1.1 02/17/2023 0901   MONOABS 1.1 (H) 02/17/2023 0901   EOSABS 0.1 02/17/2023 0901   BASOSABS 0.1 02/17/2023 0901      Latest Ref Rng & Units 02/20/2023    2:04  AM 02/19/2023    2:20 AM 02/18/2023    6:42 AM  CMP  Glucose 70 - 99 mg/dL 409  91  811   BUN 8 - 23 mg/dL 57  55  47   Creatinine 0.61 - 1.24 mg/dL 9.14  7.82  9.56   Sodium 135 - 145 mmol/L 129  129  130   Potassium 3.5 - 5.1 mmol/L 4.8  5.0  4.7   Chloride 98 - 111 mmol/L 93  93  95   CO2 22 - 32 mmol/L 26  24  24    Calcium 8.9 - 10.3 mg/dL 8.5  8.8  9.1   Total Protein 6.5 - 8.1 g/dL 5.1  5.2  5.4   Total Bilirubin <1.2 mg/dL 8.9  9.8  21.3   Alkaline Phos 38 - 126 U/L 235  240  274   AST 15 - 41 U/L 144  163  145   ALT 0 - 44 U/L 91  99  94      Scheduled Meds:  (feeding supplement) PROSource Plus  30 mL Oral BID BM   sodium chloride   Intravenous Once   apixaban  5 mg Oral BID   divalproex  750 mg Oral BID   feeding supplement  237 mL Oral QID   midodrine  10 mg Oral TID WC   multivitamin with minerals  1 tablet Oral Daily   sodium chloride flush  10 mL Intracatheter Q8H   sodium chloride flush  3 mL Intravenous Q12H   sodium chloride flush  5 mL Intracatheter Q8H   sodium chloride flush  5 mL Intracatheter Q8H   traZODone  50 mg Oral QHS   Continuous Infusions:  sodium chloride 75 mL/hr at 02/20/23 1303    Time 46  [name], MD  [facility]

## 2023-02-20 NOTE — Progress Notes (Signed)
Mobility Specialist Progress Note:   02/20/23 1049  Therapy Vitals  Temp 97.8 F (36.6 C)  Temp Source Oral  Resp 12  BP 101/75  Patient Position (if appropriate) Lying  Orthostatic Sitting  BP- Sitting 101/75  Oxygen Therapy  SpO2 98 %  Mobility  Activity Ambulated with assistance in hallway  Level of Assistance Contact guard assist, steadying assist  Assistive Device Front wheel walker  Distance Ambulated (ft) 90 ft  Activity Response Tolerated well  Mobility Referral Yes  Mobility visit 1 Mobility  Mobility Specialist Start Time (ACUTE ONLY) 1006  Mobility Specialist Stop Time (ACUTE ONLY) 1025  Mobility Specialist Time Calculation (min) (ACUTE ONLY) 19 min    Pre Mobility: 97 HR,  101/75 (84) BP  Pt received in chair, agreeable to mobility. Require minA to stand and minG during ambulation. C/o some fatigue, limiting distance. Pt left in bed with call bell and all needs met.  D'Vante Earlene Plater Mobility Specialist Please contact via Special educational needs teacher or Rehab office at [phone]

## 2023-02-20 NOTE — TOC Progression Note (Signed)
Transition of Care [facility]) - Progression Note    Patient Details  Name: Samuel Mayo MRN: [id number] Date of Birth: December 02, 1945  Transition of Care [facility]) CM/SW Contact  Delilah Shan, LCSWA Phone Number: 02/20/2023, 1:19 PM  Clinical Narrative:     Patients insurance authorization currently pending for SNF. CSW will continue to follow.  Expected Discharge Plan: Skilled Nursing Facility Barriers to Discharge: English as a second language teacher, Continued Medical Work up  Expected Discharge Plan and Services In-house Referral: Clinical Social Work Discharge Planning Services: [employer] Consult Post Acute Care Choice: Skilled Nursing Facility Living arrangements for the past 2 months: Single Family Home                                       Social Determinants of Health (SDOH) Interventions SDOH Screenings   Food Insecurity: No Food Insecurity (01/25/2023)  Housing: Patient Declined (01/25/2023)  Transportation Needs: No Transportation Needs (01/25/2023)  Utilities: Not At Risk (01/25/2023)  Alcohol Screen: Low Risk  (10/21/2022)  Depression (PHQ2-9): High Risk (01/13/2023)  Financial Resource Strain: Low Risk  (10/21/2022)  Physical Activity: Insufficiently Active (10/21/2022)  Social Connections: Moderately Integrated (10/21/2022)  Stress: Stress Concern Present (10/21/2022)  Tobacco Use: Low Risk  (01/31/2023)    Readmission Risk Interventions     No data to display

## 2023-02-20 NOTE — Plan of Care (Signed)

## 2023-02-20 NOTE — Plan of Care (Signed)
  Problem: Education: Goal: Knowledge of General Education information will improve Description: Including pain rating scale, medication(s)/side effects and non-pharmacologic comfort measures Outcome: Progressing   Problem: Health Behavior/Discharge Planning: Goal: Ability to manage health-related needs will improve Outcome: Progressing   Problem: Clinical Measurements: Goal: Ability to maintain clinical measurements within normal limits will improve Outcome: Progressing Goal: Will remain free from infection Outcome: Progressing Goal: Diagnostic test results will improve Outcome: Progressing Goal: Respiratory complications will improve Outcome: Progressing Goal: Cardiovascular complication will be avoided Outcome: Progressing   Problem: Activity: Goal: Risk for activity intolerance will decrease Outcome: Progressing   Problem: Nutrition: Goal: Adequate nutrition will be maintained Outcome: Progressing   Problem: Elimination: Goal: Will not experience complications related to bowel motility Outcome: Progressing Goal: Will not experience complications related to urinary retention Outcome: Progressing   Problem: Pain Management: Goal: General experience of comfort will improve Outcome: Progressing   Problem: Safety: Goal: Ability to remain free from injury will improve Outcome: Progressing   Problem: Skin Integrity: Goal: Risk for impaired skin integrity will decrease Outcome: Progressing

## 2023-02-21 DIAGNOSIS — K831 Obstruction of bile duct: Secondary | ICD-10-CM | POA: Diagnosis not present

## 2023-02-21 LAB — COMPREHENSIVE METABOLIC PANEL
ALT: 90 U/L — ABNORMAL HIGH (ref 0–44)
AST: 148 U/L — ABNORMAL HIGH (ref 15–41)
Albumin: 2.1 g/dL — ABNORMAL LOW (ref 3.5–5.0)
Alkaline Phosphatase: 244 U/L — ABNORMAL HIGH (ref 38–126)
Anion gap: 10 (ref 5–15)
BUN: 46 mg/dL — ABNORMAL HIGH (ref 8–23)
CO2: 27 mmol/L (ref 22–32)
Calcium: 8.7 mg/dL — ABNORMAL LOW (ref 8.9–10.3)
Chloride: 95 mmol/L — ABNORMAL LOW (ref 98–111)
Creatinine, Ser: 1.06 mg/dL (ref 0.61–1.24)
GFR, Estimated: >60 mL/min (ref >60)
Glucose, Bld: 108 mg/dL — ABNORMAL HIGH (ref 70–99)
Potassium: 4.9 mmol/L (ref 3.5–5.1)
Sodium: 132 mmol/L — ABNORMAL LOW (ref 135–145)
Total Bilirubin: 9.1 mg/dL — ABNORMAL HIGH (ref <1.2)
Total Protein: 5.5 g/dL — ABNORMAL LOW (ref 6.5–8.1)

## 2023-02-21 MED ORDER — DIVALPROEX SODIUM 250 MG PO DR TAB: 750.0000 mg | DELAYED_RELEASE_TABLET | Freq: Two times a day (BID) | ORAL | 0 refills | Status: DC

## 2023-02-21 MED ORDER — POLYETHYLENE GLYCOL 3350 17 G PO PACK: 17.0000 g | PACK | Freq: Every day | ORAL | Status: DC | PRN

## 2023-02-21 MED ORDER — DICLOFENAC SODIUM 1 % EX GEL: 2.0000 g | Freq: Four times a day (QID) | CUTANEOUS | Status: DC | PRN

## 2023-02-21 MED ORDER — MIDODRINE HCL 10 MG PO TABS: 10.0000 mg | ORAL_TABLET | Freq: Three times a day (TID) | ORAL | Status: DC

## 2023-02-21 MED ORDER — TRAMADOL HCL 50 MG PO TABS: 50.0000 mg | ORAL_TABLET | Freq: Four times a day (QID) | ORAL | 0 refills | Status: DC | PRN

## 2023-02-21 MED ORDER — SODIUM CHLORIDE FLUSH 0.9 % IV SOLN: 10.0000 mL | INTRAVENOUS | Status: DC | PRN

## 2023-02-21 NOTE — Progress Notes (Signed)
For transfer to [address]. Report given to Rn. Waiting for his son Caryn Bee as per request.

## 2023-02-21 NOTE — TOC Progression Note (Signed)
Transition of Care [facility]) - Progression Note    Patient Details  Name: Samuel Mayo MRN: [id number] Date of Birth: January 07, 1946  Transition of Care [facility]) CM/SW Contact  Carley Hammed, LCSW Phone Number: 02/21/2023, 3:58 PM  Clinical Narrative:    CSW reviewed chart on admission to unit. Noting pt is from [place] with plans to return. CSW noting Berkley Harvey is still pending at this time. TOC will continue to follow for DC needs.    Expected Discharge Plan: Skilled Nursing Facility Barriers to Discharge: Insurance Authorization  Expected Discharge Plan and Services In-house Referral: Clinical Social Work Discharge Planning Services: CM Consult Post Acute Care Choice: Skilled Nursing Facility Living arrangements for the past 2 months: Single Family Home Expected Discharge Date: 02/21/23                                     Social Determinants of Health (SDOH) Interventions SDOH Screenings   Food Insecurity: No Food Insecurity (01/25/2023)  Housing: Patient Declined (01/25/2023)  Transportation Needs: No Transportation Needs (01/25/2023)  Utilities: Not At Risk (01/25/2023)  Alcohol Screen: Low Risk  (10/21/2022)  Depression (PHQ2-9): High Risk (01/13/2023)  Financial Resource Strain: Low Risk  (10/21/2022)  Physical Activity: Insufficiently Active (10/21/2022)  Social Connections: Moderately Integrated (10/21/2022)  Stress: Stress Concern Present (10/21/2022)  Tobacco Use: Low Risk  (01/31/2023)    Readmission Risk Interventions     No data to display

## 2023-02-21 NOTE — Progress Notes (Signed)
Bil . Buttocks reddened, cleansed and cream applied.

## 2023-02-21 NOTE — Progress Notes (Signed)
Occupational Therapy Treatment Patient Details Name: Samuel Mayo MRN: [id number] DOB: September 12, 1945 Today's Date: 02/21/2023   History of present illness 77 y.o. male presents to [facility] 01/24/23 with worsening jaundice and follow up imaging from recent admit that showed non-visualization of CBD. MRI/MRCP showed increased mild to moderate intrahepatic bile duct dilation and new mural thickening and enhancement of the extrahepatic bile duct suspicious for cholangitis. IR performed PTC 11/29 with placement of internal/external drain. 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 Recent admit from 10/28 -11/1 for cholecystitis, diagnosed with Mirizzi syndrome ,underwent cholecystostomy. PMHx: seizure disorder on depakote, PAF, HTN, a-fib, acute biliary pancreatitis, cholecystitis, gout   OT comments  Pt. Seen for skilled OT treatment session. Wife present for session.  Pt. Able to complete bed mobility, and short distance transfer to recliner with CGA with intermittent cues for sequencing and also management of multiple lines/drains.  LB dressing seated with set up.  Grooming task seated with set up. Pt. Remains slow processing and requires cues for task completion.  Long response delays and often can not answer the question when repeated even when other methods used like "this or that" with very specific choices.  Cont. With acute OT POC and progress ADLs next session.       If plan is discharge home, recommend the following:  A little help with walking and/or transfers;A little help with bathing/dressing/bathroom;Assistance with cooking/housework;Assist for transportation;Help with stairs or ramp for entrance   Equipment Recommendations  Other (comment)    Recommendations for Other Services      Precautions / Restrictions Precautions Precautions: Fall Precaution Comments: biliary tubes x 3 RUQ, orthostatic hypotension Restrictions Weight Bearing Restrictions Per Provider Order: No        Mobility Bed Mobility Overal bed mobility: Needs Assistance Bed Mobility: Supine to Sit     Supine to sit: Contact guard, HOB elevated          Transfers Overall transfer level: Needs assistance Equipment used: Rolling walker (2 wheels) Transfers: Sit to/from Stand, Bed to chair/wheelchair/BSC Sit to Stand: Contact guard assist, From elevated surface     Step pivot transfers: Contact guard assist     General transfer comment: Assist for safety and lines. Incr time     Balance                                           ADL either performed or assessed with clinical judgement   ADL Overall ADL's : Needs assistance/impaired     Grooming: Oral care;Set up;Sitting Grooming Details (indicate cue type and reason): pt. swished for a long time, hard to decipher if that was his normal amount of time or he was distracted and forgot to spit             Lower Body Dressing: Set up;Sitting/lateral leans Lower Body Dressing Details (indicate cue type and reason): to manage socks, able to reach BLEs without issue Toilet Transfer: Contact guard assist;Rolling walker (2 wheels);Cueing for sequencing Toilet Transfer Details (indicate cue type and reason): simulated with transfer from eob to recliner                Extremity/Trunk Assessment              Vision       Perception     Praxis      Cognition Arousal: Alert Behavior  During Therapy: Flat affect Overall Cognitive Status: Within Functional Limits for tasks assessed Area of Impairment: Problem solving, Attention                       Following Commands: Follows one step commands consistently, Follows one step commands with increased time   Awareness: Emergent Problem Solving: Slow processing General Comments: Following all commands with increased time. increased time for processing. Easily distracted-long delay afer each question. even with suggestions/options  provided would sit for a while and not answer then say "i tell  ya dont give me too many choices"  would have to repeat the choices and he would choose inconsistently        Exercises      Shoulder Instructions       General Comments  Has a dog Gemma that he loves!!! She has been coming to the hospital to visit him.      Pertinent Vitals/ Pain       Pain Assessment Pain Assessment: No/denies pain  Home Living                                          Prior Functioning/Environment              Frequency  Min 1X/week        Progress Toward Goals  OT Goals(current goals can now be found in the care plan section)  Progress towards OT goals: Progressing toward goals     Plan      Co-evaluation                 AM-PAC OT "6 Clicks" Daily Activity     Outcome Measure   Help from another person eating meals?: A Little Help from another person taking care of personal grooming?: A Little Help from another person toileting, which includes using toliet, bedpan, or urinal?: A Little Help from another person bathing (including washing, rinsing, drying)?: A Little Help from another person to put on and taking off regular upper body clothing?: A Little Help from another person to put on and taking off regular lower body clothing?: A Lot 6 Click Score: 17    End of Session Equipment Utilized During Treatment: Gait belt;Rolling walker (2 wheels)  OT Visit Diagnosis: Unsteadiness on feet (R26.81);Muscle weakness (generalized) (M62.81);Other symptoms and signs involving cognitive function   Activity Tolerance Patient tolerated treatment well   Patient Left in chair;with call bell/phone within reach;with family/visitor present   Nurse Communication Other (comment) (rn states okay to work with pt. today)        Time: 5427-0623 OT Time Calculation (min): 30 min  Charges: OT General Charges $OT Visit: 1 Visit OT Treatments $Self Care/Home  Management : 23-37 mins  Boneta Lucks, COTA/L Acute Rehabilitation [phone]   Alessandra Bevels Lorraine-COTA/L 02/21/2023, 10:50 AM

## 2023-02-21 NOTE — Progress Notes (Signed)
Transferred to [address] by chair awake and alert. Belongings with son .

## 2023-02-21 NOTE — Progress Notes (Signed)
Physical Therapy Treatment Patient Details Name: Samuel Mayo MRN: [id number] DOB: 1945-03-08 Today's Date: 02/21/2023   History of Present Illness 77 y.o. male presents to [facility] 01/24/23 with worsening jaundice and follow up imaging from recent admit that showed non-visualization of CBD. MRI/MRCP showed increased mild to moderate intrahepatic bile duct dilation and new mural thickening and enhancement of the extrahepatic bile duct suspicious for cholangitis. IR performed PTC 11/29 with placement of internal/external drain. 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 Recent admit from 10/28 -11/1 for cholecystitis, diagnosed with Mirizzi syndrome ,underwent cholecystostomy. PMHx: seizure disorder on depakote, PAF, HTN, a-fib, acute biliary pancreatitis, cholecystitis, gout    PT Comments  Pt continues with slow progress; limited by medical diagnosis evolution and today mentation. Pt is Min A for bed mobility due to weakness in the core and CGA for steadying assist with sit to stand and short distance gait for transfers. Pt declined further gait. Due to pt current functional status, home set up and available assistance at home recommending skilled physical therapy services < 3 hours/day in order to address strength, balance and functional mobility to decrease risk for falls, injury, immobility, skin break down and re-hospitalization.      If plan is discharge home, recommend the following: A little help with walking and/or transfers;Help with stairs or ramp for entrance;Assist for transportation   Can travel by private vehicle     Yes  Equipment Recommendations  None recommended by PT       Precautions / Restrictions Precautions Precautions: Fall Precaution Comments: biliary tubes x 3 RUQ, orthostatic hypotension Restrictions Weight Bearing Restrictions Per Provider Order: No     Mobility  Bed Mobility Overal bed mobility: Needs Assistance Bed Mobility: Supine to Sit      Supine to sit: Min assist     General bed mobility comments: Min A at the trunk to get to mid line with posterior LOB.    Transfers Overall transfer level: Needs assistance Equipment used: Rolling walker (2 wheels) Transfers: Sit to/from Stand, Bed to chair/wheelchair/BSC     Step pivot transfers: Contact guard assist       General transfer comment: steadying assist with RW    Ambulation/Gait Ambulation/Gait assistance: Contact guard assist Gait Distance (Feet): 5 Feet Assistive device: Rolling walker (2 wheels) Gait Pattern/deviations: Step-through pattern, Decreased step length - right, Decreased step length - left, Shuffle Gait velocity: decreased Gait velocity interpretation: <1.8 ft/sec, indicate of risk for recurrent falls   General Gait Details: Assist for safety, short steps from EOB to recliner. Deferred furthe mobility.      Balance Overall balance assessment: Needs assistance Sitting-balance support: Feet supported Sitting balance-Leahy Scale: Good     Standing balance support: Bilateral upper extremity supported Standing balance-Leahy Scale: Fair      Cognition Arousal: Alert Behavior During Therapy: Flat affect Overall Cognitive Status: Within Functional Limits for tasks assessed Area of Impairment: Attention         Following Commands: Follows one step commands consistently, Follows one step commands with increased time   Awareness: Emergent Problem Solving: Slow processing General Comments: Pt is down today           General Comments General comments (skin integrity, edema, etc.): Pt continues to be jaundiced      Pertinent Vitals/Pain Pain Assessment Pain Assessment: Faces Faces Pain Scale: Hurts a little bit Pain Location: drain sites Pain Descriptors / Indicators: Sore Pain Intervention(s): Monitored during session     PT Goals (  current goals can now be found in the care plan section) Acute Rehab PT Goals Patient Stated  Goal: to work on balance PT Goal Formulation: With patient/family Time For Goal Achievement: 03/03/23 Potential to Achieve Goals: Good Progress towards PT goals: Progressing toward goals    Frequency    Min 1X/week      PT Plan  Continue with current POC        AM-PAC PT "6 Clicks" Mobility   Outcome Measure  Help needed turning from your back to your side while in a flat bed without using bedrails?: A Little Help needed moving from lying on your back to sitting on the side of a flat bed without using bedrails?: A Little Help needed moving to and from a bed to a chair (including a wheelchair)?: A Little Help needed standing up from a chair using your arms (e.g., wheelchair or bedside chair)?: A Little Help needed to walk in hospital room?: A Little Help needed climbing 3-5 steps with a railing? : A Lot 6 Click Score: 17    End of Session Equipment Utilized During Treatment: Gait belt Activity Tolerance: Patient tolerated treatment well;Other (comment) (limited by mentation pt was very down today) Patient left: in bed;with call bell/phone within reach;with bed alarm set Nurse Communication: Mobility status PT Visit Diagnosis: Other abnormalities of gait and mobility (R26.89);Muscle weakness (generalized) (M62.81)     Time: 1610-9604 PT Time Calculation (min) (ACUTE ONLY): 28 min  Charges:    $Therapeutic Activity: 23-37 mins PT General Charges $$ ACUTE PT VISIT: 1 Visit                    Harrel Carina, DPT, CLT  Acute Rehabilitation Services Office: [phone] (Secure chat preferred)    Samuel Mayo 02/21/2023, 3:46 PM

## 2023-02-21 NOTE — Discharge Summary (Addendum)
Physician Discharge Summary  Jinu Hoke Binford MVH:[id number] DOB: 1945-06-29 DOA: 01/24/2023  PCP: [name], [name], MD  Admit date: 01/24/2023 Discharge date: 02/21/2023  Time spent: 46 minutes  Recommendations for Outpatient Follow-up:  Requires Chem-12 INR CBC in 3 to 4 days Requires close coordination and follow-up with oncologist in the outpatient setting for PET scan-CC Dr. Mosetta Putt Please force fluids and note medication changes if blood pressure improves can resume antihypertensives Recommend close follow-up with interventional radiology for management of percutaneous cholecystotomy and that needs to be flushed daily as per orders  Discharge Diagnoses:  MAIN problem for hospitalization   New diagnosis of Klatskin tumor with metastases AKI during hospital stay Previous cholecystotomy  Please see below for itemized issues addressed in HOpsital- refer to other progress notes for clarity if needed  Discharge Condition: gaurded  Diet recommendation:  regular  Filed Weights   02/05/23 0223 02/11/23 1055 02/21/23 0420  Weight: 82.5 kg 83.5 kg 81.4 kg    History of present illness:  77 year old male Known mitral regurgitation status post annuloplasty, atrial flutter fib on Eliquis CHADVASC >4 HTN Seizures Diagnosed with acute cholecystitis 12/30/2022 had a RUQ drain placed on 10/21-was sent home on Augmentin and discharged 11/1 He followed up with general surgeon and he also had an endoscopy secondary to persistently elevated LFT and was --- he was evaluated by IR on 11/22 for reevaluation of gallbladder issues-and during imaging he was noted to have persistent occlusion of cystic duct without opacification 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 and patient was told to come to the emergency room 11/20 3 GI consulted 11/27 ERCP unsuccessful unable to enter bile duct 11/29 IR placed biliary drain concern for Klatskin's tumor 11/28 nephrology consulted for hyponatremia 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 Mosetta Putt to follow may need second opinion at [facility] Pain control tramadol every 6 as needed moderate pain-he is able to manage and he is ambulatory to some degree   Sepsis on admission secondary to cholangitis with Pseudomonas bacteremia Recent acute cholecystitis with cholecystotomy and drain upsize 11/22 to help with drainage Will need to have the cholecystotomy drain flushed daily with 10 cc Completed Levaquin Flagyl 12/18 Blood culture X212/12 was negative and cleared positive Pseudomonas blood cultures from 12/6   AKI Had volume depletion during hospital stay was intermittently on IV fluids-needs to force fluids in the outpatient setting-saline locked on day of discharge 12/20 and will need labs in about 3 to 4 days to ensure it improves Would monitor trends in the outpatient setting-high risk for poor hydration malnutrition etc. and need to at least get 1.5 L negative fluid to keep volumes even   Paroxysmal A-fib CHADVASC >4 mitral valve regurg Orthostatic hypotension-now on midodrine 10 3 times daily Hypotension precludes GDMT and patient has been held off of metoprolol XL 25 and amlodipine 5 Continues eliquis 5 bid   Previous seizure disorder Continues on Depakote 750 twice daily--Rx given at time of D/c   Discharge Exam: Vitals:   02/21/23 0305 02/21/23 0739  BP: 101/75 99/70  Pulse: 99 85  Resp: 16 15  Temp: 97.7 F (36.5 C) 97.8 F (36.6 C)  SpO2: 98% 96%  Subj on day of d/c   Awake coherent feels a little bit weak sitting up in bed no nausea no  vomiting  General Exam on discharge  My NCAT icteric white male no distress Chest is clear Abdomen is soft no rebound no guarding ROM Lower extremity edema Neuro intact  Discharge Instructions   Discharge Instructions     Diet - low sodium heart healthy   Complete by: As directed    Discharge wound care:   Complete by: As directed    Pressure Injury 02/19/23 Sacrum Medial Stage 2 -  Partial thickness loss of dermis presenting as a shallow open injury with a red, pink wound bed without slough.   Increase activity slowly   Complete by: As directed       Allergies as of 02/21/2023       Reactions   Other Other (See Comments)   Childhood reaction to Neosporin Ophthalmic ointment caused severe eye redness, irritation        Medication List     STOP taking these medications    amLODipine 5 MG tablet Commonly known as: NORVASC   amoxicillin-clavulanate 875-125 MG tablet Commonly known as: AUGMENTIN   gabapentin 300 MG capsule Commonly known as: NEURONTIN   ibandronate 150 MG tablet Commonly known as: BONIVA   metoprolol succinate 25 MG 24 hr tablet Commonly known as: TOPROL-XL   OVER THE COUNTER MEDICATION       TAKE these medications    diclofenac Sodium 1 % Gel Commonly known as: VOLTAREN Apply 2 g topically 4 (four) times daily as needed (joint pain).   divalproex 250 MG DR tablet Commonly known as: DEPAKOTE Take 3 tablets (750 mg total) by mouth 2 (two) times daily. Take in addition to 500 mg twice a day (pt should have a total of 750 mg twice a day) What changed:  how much to take Another medication with the same name was removed. Continue taking this medication, and follow the directions you see here.   Eliquis 5 MG Tabs tablet Generic drug: apixaban TAKE 1 TABLET BY MOUTH TWICE A DAY   LUTEIN PO Take 1 tablet by mouth every evening.   midodrine 10 MG tablet Commonly known as: PROAMATINE Take 1 tablet (10 mg total) by mouth 3 (three)  times daily with meals.   multivitamin with minerals Tabs tablet Take 1 tablet by mouth every evening.   polyethylene glycol 17 g packet Commonly known as: MIRALAX / GLYCOLAX Take 17 g by mouth daily as needed for mild constipation.   sodium chloride flush 0.9 % Soln injection 10 mLs by Intracatheter route as needed. Flush percutaneous cholecystostomy drain with 10 ml normal saline once daily.   traMADol 50 MG tablet Commonly known as: ULTRAM Take 1 tablet (50 mg total) by mouth every 6 (six) hours as needed for moderate pain (pain score 4-6).   VITAMIN D-3 PO Take 1 capsule by mouth every evening.               Discharge Care Instructions  (From admission, onward)           Start     Ordered   02/21/23 0000  Discharge wound care:       Comments: Pressure Injury 02/19/23 Sacrum Medial Stage 2 -  Partial thickness loss of dermis presenting as a shallow open injury with a red, pink wound bed without slough.   02/21/23 0856           Allergies  Allergen Reactions   Other Other (See Comments)    Childhood reaction to Neosporin Ophthalmic ointment caused severe eye redness, irritation    Follow-up Information     Gwenyth Bender, MD Follow up.   Specialty: Surgical Oncology Why: A referral has been placed to Surgical Oncology, Dr. Stephanie Coup, at [facility] in [place].  His office should reach out to you.  If you fail to hear from them, please call their office number [phone]. Contact information: [address] [place] [address] [phone]         [facility] Follow up.   Why: IR scheduler will call you for 6-8 week drain exchange. Please call [phone]. Contact information: [address] [place] [address] [phone]                 The results of significant diagnostics from this hospitalization (including imaging, microbiology, ancillary and laboratory) are listed below for  reference.    Significant Diagnostic Studies: IR EXCHANGE BILIARY DRAIN Result Date: 02/14/2023 INDICATION: Patient initially diagnosed with Mirizzi syndrome, post cholecystostomy tube placement on 01/02/2023. Unfortunately, patient experienced worsening hyperbilirubinemia, ultimately undergoing attempted though unsuccessful ERCP. As such, patient underwent placement of a right-sided biliary drainage catheter on the 01/31/2023 and ultimately placement of a left-sided biliary drainage catheter on 02/07/2023. 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 Since that time, despite cholecystostomy and bilateral biliary drainage catheter placement, the patient's hyperbilirubinemia has persisted. As such, the patient presents today for cholangiogram and potential biliary drainage catheter exchange and up sizing. EXAM: 1. Cholangiogram via existing cholecystostomy tube. CHOLANGIOGRAM VIA EXISTING CHOLECYSTOSTOMY TUBE 2. FLUOROSCOPIC GUIDED EXCHANGE AND UP SIZING TO NOW 14 FRENCH RIGHT-SIDED BILIARY DRAINAGE CATHETER 3. FLUOROSCOPIC GUIDED EXCHANGE AND UP SIZING TO NOW 12 FRENCH LEFT-SIDED BILIARY DRAINAGE CATHETER COMPARISON:  COMPARISON Image guided left-sided biliary drainage catheter placement and brush biopsy - 02/07/2023 Image guided right-sided biliary drainage catheter placement - 01/31/2023 Cholecystostomy tube exchange - 01/24/2023 Image guided cholecystostomy tube placement - 01/02/2023 CT abdomen and pelvis - 02/06/2023; 02/02/2023 Abdominal MRI - 12/29/2022 CONTRAST:  25 mL Omnipaque-300 administered into the collecting system ANESTHESIA/SEDATION: ANESTHESIA/SEDATION 3 minutes, 6 seconds (117 mGy) FLUOROSCOPY TIME:  3 minutes, 6 seconds (1 image 17 mGy) COMPLICATIONS: None immediate. TECHNIQUE: Informed written consent was obtained from the patient after a discussion of the risks, benefits and alternatives to treatment. Questions regarding the procedure were encouraged and  answered. A timeout was performed prior to the initiation of the procedure. Preprocedural spot fluoroscopic image was obtained of the right upper abdominal quadrant. Cholangiogram were performed via the right and left biliary drainage catheters as well as the cholecystostomy tube. Next, the external portion of the left lower drainage catheter was cut and cannulated with an Amplatz wire which was advanced through the biliary drainage catheter to the level of the duodenum. Under fluoroscopic guided exchange, the pre-existing 10 [place] left-sided biliary drainage catheter was exchanged for a new, slightly larger, now 67 [place] biliary drainage catheter with end coiled and locked within the duodenum. The identical procedure was performed for the right-sided biliary drainage catheter, ultimately with exchange of the pre-existing 12 [place] biliary drainage catheter for a new, slightly larger now 37 [place] biliary drainage catheter with end coiled and locked within the duodenum. Small amount of contrast was injected via the exchanged biliary drainage catheters and post fluoroscopic and radiographic images were obtained. The biliary drainage catheters were and secured to the skin with  interrupted sutures and all drainage catheters were reconnected to gravity bags. Dressings were applied. The patient tolerated the procedure well without immediate postprocedural complication. FINDINGS: Appropriately positioned and functioning cholecystostomy tube. There is persistent occlusion of the cystic duct at its mid/peripheral aspects. Appropriate positioning and functionality of both the right and left-sided biliary drainage catheters with opacification of the intrahepatic biliary system as well as passage of contrast through the catheters to the level of the duodenum. After fluoroscopic guided exchange and up sizing, both biliary drainage catheters are appropriately positioned with end coiled and locked within the duodenum.  IMPRESSION: 1. Successful fluoroscopic guided exchange and up sizing of now 68 French right-sided biliary drainage catheter with end coiled and locked within the duodenum. 2. Successful fluoroscopic guided exchange and up sizing of now 73 French left-sided biliary drainage catheter with end coiled and locked within the duodenum. 3. Appropriately positioned and functioning cholecystostomy tube with persistent complete occlusion of the mid/peripheral aspect of the cystic duct. The cholecystostomy tube was not exchanged as it was most recently exchanged on 01/24/2023. PLAN: Recommend obtaining daily CMP. If patient's hyperbilirubinemia immediate does not improve in the coming days, would recommend further evaluation with repeat either contrast-enhanced abdominal CT or MRCP as indicated. Electronically Signed   By: Simonne Come M.D.   On: 02/14/2023 07:15   IR EXCHANGE BILIARY DRAIN Result Date: 02/14/2023 INDICATION: Patient initially diagnosed with Mirizzi syndrome, post cholecystostomy tube placement on 01/02/2023. Unfortunately, patient experienced worsening hyperbilirubinemia, ultimately undergoing attempted though unsuccessful ERCP. As such, patient underwent placement of a right-sided biliary drainage catheter on the 01/31/2023 and ultimately placement of a left-sided biliary drainage catheter on 02/07/2023. 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 Since that time, despite cholecystostomy and bilateral biliary drainage catheter placement, the patient's hyperbilirubinemia has persisted. As such, the patient presents today for cholangiogram and potential biliary drainage catheter exchange and up sizing. EXAM: 1. Cholangiogram via existing cholecystostomy tube. CHOLANGIOGRAM VIA EXISTING CHOLECYSTOSTOMY TUBE 2. FLUOROSCOPIC GUIDED EXCHANGE AND UP SIZING TO NOW 14 FRENCH RIGHT-SIDED BILIARY DRAINAGE CATHETER 3. FLUOROSCOPIC GUIDED EXCHANGE AND UP SIZING TO NOW 12 FRENCH  LEFT-SIDED BILIARY DRAINAGE CATHETER COMPARISON:  COMPARISON Image guided left-sided biliary drainage catheter placement and brush biopsy - 02/07/2023 Image guided right-sided biliary drainage catheter placement - 01/31/2023 Cholecystostomy tube exchange - 01/24/2023 Image guided cholecystostomy tube placement - 01/02/2023 CT abdomen and pelvis - 02/06/2023; 02/02/2023 Abdominal MRI - 12/29/2022 CONTRAST:  25 mL Omnipaque-300 administered into the collecting system ANESTHESIA/SEDATION: ANESTHESIA/SEDATION 3 minutes, 6 seconds (117 mGy) FLUOROSCOPY TIME:  3 minutes, 6 seconds (1 image 17 mGy) COMPLICATIONS: None immediate. TECHNIQUE: Informed written consent was obtained from the patient after a discussion of the risks, benefits and alternatives to treatment. Questions regarding the procedure were encouraged and answered. A timeout was performed prior to the initiation of the procedure. Preprocedural spot fluoroscopic image was obtained of the right upper abdominal quadrant. Cholangiogram were performed via the right and left biliary drainage catheters as well as the cholecystostomy tube. Next, the external portion of the left lower drainage catheter was cut and cannulated with an Amplatz wire which was advanced through the biliary drainage catheter to the level of the duodenum. Under fluoroscopic guided exchange, the pre-existing 10 [place] left-sided biliary drainage catheter was exchanged for a new, slightly larger, now 14 [place] biliary drainage catheter with end coiled and locked within the duodenum. The identical procedure was performed for the right-sided biliary drainage catheter, ultimately with exchange of the  pre-existing 40 [place] biliary drainage catheter for a new, slightly larger now 79 [place] biliary drainage catheter with end coiled and locked within the duodenum. Small amount of contrast was injected via the exchanged biliary drainage catheters and post fluoroscopic and radiographic images were  obtained. The biliary drainage catheters were and secured to the skin with interrupted sutures and all drainage catheters were reconnected to gravity bags. Dressings were applied. The patient tolerated the procedure well without immediate postprocedural complication. FINDINGS: Appropriately positioned and functioning cholecystostomy tube. There is persistent occlusion of the cystic duct at its mid/peripheral aspects. Appropriate positioning and functionality of both the right and left-sided biliary drainage catheters with opacification of the intrahepatic biliary system as well as passage of contrast through the catheters to the level of the duodenum. After fluoroscopic guided exchange and up sizing, both biliary drainage catheters are appropriately positioned with end coiled and locked within the duodenum. IMPRESSION: 1. Successful fluoroscopic guided exchange and up sizing of now 10 French right-sided biliary drainage catheter with end coiled and locked within the duodenum. 2. Successful fluoroscopic guided exchange and up sizing of now 18 French left-sided biliary drainage catheter with end coiled and locked within the duodenum. 3. Appropriately positioned and functioning cholecystostomy tube with persistent complete occlusion of the mid/peripheral aspect of the cystic duct. The cholecystostomy tube was not exchanged as it was most recently exchanged on 01/24/2023. PLAN: Recommend obtaining daily CMP. If patient's hyperbilirubinemia immediate does not improve in the coming days, would recommend further evaluation with repeat either contrast-enhanced abdominal CT or MRCP as indicated. Electronically Signed   By: Simonne Come M.D.   On: 02/14/2023 07:15   IR CHOLANGIOGRAM EXISTING TUBE Result Date: 02/14/2023 INDICATION: Patient initially diagnosed with Mirizzi syndrome, post cholecystostomy tube placement on 01/02/2023. Unfortunately, patient experienced worsening hyperbilirubinemia, ultimately undergoing  attempted though unsuccessful ERCP. As such, patient underwent placement of a right-sided biliary drainage catheter on the 01/31/2023 and ultimately placement of a left-sided biliary drainage catheter on 02/07/2023. 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 Since that time, despite cholecystostomy and bilateral biliary drainage catheter placement, the patient's hyperbilirubinemia has persisted. As such, the patient presents today for cholangiogram and potential biliary drainage catheter exchange and up sizing. EXAM: 1. Cholangiogram via existing cholecystostomy tube. CHOLANGIOGRAM VIA EXISTING CHOLECYSTOSTOMY TUBE 2. FLUOROSCOPIC GUIDED EXCHANGE AND UP SIZING TO NOW 14 FRENCH RIGHT-SIDED BILIARY DRAINAGE CATHETER 3. FLUOROSCOPIC GUIDED EXCHANGE AND UP SIZING TO NOW 12 FRENCH LEFT-SIDED BILIARY DRAINAGE CATHETER COMPARISON:  COMPARISON Image guided left-sided biliary drainage catheter placement and brush biopsy - 02/07/2023 Image guided right-sided biliary drainage catheter placement - 01/31/2023 Cholecystostomy tube exchange - 01/24/2023 Image guided cholecystostomy tube placement - 01/02/2023 CT abdomen and pelvis - 02/06/2023; 02/02/2023 Abdominal MRI - 12/29/2022 CONTRAST:  25 mL Omnipaque-300 administered into the collecting system ANESTHESIA/SEDATION: ANESTHESIA/SEDATION 3 minutes, 6 seconds (117 mGy) FLUOROSCOPY TIME:  3 minutes, 6 seconds (1 image 17 mGy) COMPLICATIONS: None immediate. TECHNIQUE: Informed written consent was obtained from the patient after a discussion of the risks, benefits and alternatives to treatment. Questions regarding the procedure were encouraged and answered. A timeout was performed prior to the initiation of the procedure. Preprocedural spot fluoroscopic image was obtained of the right upper abdominal quadrant. Cholangiogram were performed via the right and left biliary drainage catheters as well as the cholecystostomy tube. Next, the external  portion of the left lower drainage catheter was cut and cannulated with an Amplatz wire which was advanced through the biliary  drainage catheter to the level of the duodenum. Under fluoroscopic guided exchange, the pre-existing 10 [place] left-sided biliary drainage catheter was exchanged for a new, slightly larger, now 65 [place] biliary drainage catheter with end coiled and locked within the duodenum. The identical procedure was performed for the right-sided biliary drainage catheter, ultimately with exchange of the pre-existing 12 [place] biliary drainage catheter for a new, slightly larger now 53 [place] biliary drainage catheter with end coiled and locked within the duodenum. Small amount of contrast was injected via the exchanged biliary drainage catheters and post fluoroscopic and radiographic images were obtained. The biliary drainage catheters were and secured to the skin with interrupted sutures and all drainage catheters were reconnected to gravity bags. Dressings were applied. The patient tolerated the procedure well without immediate postprocedural complication. FINDINGS: Appropriately positioned and functioning cholecystostomy tube. There is persistent occlusion of the cystic duct at its mid/peripheral aspects. Appropriate positioning and functionality of both the right and left-sided biliary drainage catheters with opacification of the intrahepatic biliary system as well as passage of contrast through the catheters to the level of the duodenum. After fluoroscopic guided exchange and up sizing, both biliary drainage catheters are appropriately positioned with end coiled and locked within the duodenum. IMPRESSION: 1. Successful fluoroscopic guided exchange and up sizing of now 61 French right-sided biliary drainage catheter with end coiled and locked within the duodenum. 2. Successful fluoroscopic guided exchange and up sizing of now 1 French left-sided biliary drainage catheter with end coiled and locked  within the duodenum. 3. Appropriately positioned and functioning cholecystostomy tube with persistent complete occlusion of the mid/peripheral aspect of the cystic duct. The cholecystostomy tube was not exchanged as it was most recently exchanged on 01/24/2023. PLAN: Recommend obtaining daily CMP. If patient's hyperbilirubinemia immediate does not improve in the coming days, would recommend further evaluation with repeat either contrast-enhanced abdominal CT or MRCP as indicated. Electronically Signed   By: Simonne Come M.D.   On: 02/14/2023 07:15   CT CHEST WO CONTRAST Result Date: 02/13/2023 CLINICAL DATA:  Hepatocellular carcinoma, cholangiocarcinoma, staging evaluation EXAM: CT CHEST WITHOUT CONTRAST TECHNIQUE: Multidetector CT imaging of the chest was performed following the standard protocol without IV contrast. RADIATION DOSE REDUCTION: This exam was performed according to the departmental dose-optimization program which includes automated exposure control, adjustment of the mA and/or kV according to patient size and/or use of iterative reconstruction technique. COMPARISON:  02/06/2023 FINDINGS: Cardiovascular: Unenhanced imaging of the heart is unremarkable without pericardial effusion. Mitral valve prosthesis. 4.1 cm ascending thoracic aortic aneurysm. Evaluation of the vascular lumen is limited without IV contrast. Atherosclerosis of the aorta and coronary vasculature. Mediastinum/Nodes: No enlarged mediastinal or axillary lymph nodes. Thyroid gland, trachea, and esophagus demonstrate no significant findings. Lungs/Pleura: No acute airspace disease, effusion, or pneumothorax. Linear consolidation within the right lower lobe consistent with subsegmental atelectasis or scarring. Upper Abdomen: Percutaneous biliary drains are identified traversing the left and right lobes of the liver. Downstream extent excluded by slice selection. Percutaneous cholecystostomy tube is seen coiled within the gallbladder  fossa. Minimal pneumobilia. Continued edema at the level of porta hepatis which may reflect known cholangiocarcinoma. Musculoskeletal: Large sclerotic lesions are seen within the C7 and T3 vertebral body suspicious for metastatic disease. There are chronic compression deformities of T4, T9, T11, and L1. Reconstructed images demonstrate no additional findings. IMPRESSION: 1. Large sclerotic lesions within the C7 and T3 vertebral bodies consistent with bony metastatic disease. 2. No other signs of intrathoracic metastases. 3. Stable findings  within the liver consistent with known cholangiocarcinoma and indwelling biliary and gallbladder drains. Please refer to recent CT abdomen exam for full description of findings. 4. 4.1 cm ascending thoracic aortic aneurysm. Recommend annual imaging followup by CTA or MRA. This recommendation follows 2010 ACCF/AHA/AATS/ACR/ASA/SCA/SCAI/SIR/STS/SVM Guidelines for the Diagnosis and Management of Patients with Thoracic Aortic Disease. Circulation. 2010; 121: [id number]. Aortic aneurysm NOS (ICD10-I71.9) 5. Aortic Atherosclerosis (ICD10-I70.0). Coronary artery atherosclerosis. Electronically Signed   By: Sharlet Salina M.D.   On: 02/13/2023 18:57   IR INT EXT BILIARY DRAIN WITH CHOLANGIOGRAM Result Date: 02/07/2023 INDICATION: 77 year old gentleman with history of biliary obstruction underwent percutaneous cholecystostomy placement on 01/02/2023 and replacement on 01/24/2023. Continued symptoms of jaundice and hyperbilirubinemia prompteda MRI on 01/24/2023. Enhancement of the ductal system was suspicious for cholangitis. Right hepatic biliary drain was placed on 01/31/2023. 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 Despite placement of the right hepatic drain, hyperbilirubinemia and jaundice has persisted. He returns today for placement of left hepatic biliary drain as well as brush biopsy  of the stenosed segment of the right hepatic duct through a pre-existing access. EXAM: 1. Ultrasound and fluoroscopy guided left hepatic internal external biliary drain placement 2. Fluoroscopy guided exchange of existing right hepatic internal external biliary drain 3. Brush biopsy of common and right hepatic bile ducts 4. Bilateral percutaneous transhepatic cholangiogram MEDICATIONS: Rocephin 2 g IV; The antibiotic was administered within an appropriate time frame prior to the initiation of the procedure. Demerol IV 50 mg ANESTHESIA/SEDATION: Moderate (conscious) sedation was employed during this procedure. A total of Versed 3 mg and Fentanyl 75 mcg was administered intravenously by the radiology nurse. Total intra-service moderate Sedation Time: 56 minutes. The patient's level of consciousness and vital signs were monitored continuously by radiology nursing throughout the procedure under my direct supervision. FLUOROSCOPY: Radiation Exposure Index (as provided by the fluoroscopic device): 98 mGy Kerma COMPLICATIONS: None immediate. PROCEDURE: Informed written consent was obtained from the patient after a thorough discussion of the procedural risks, benefits and alternatives. All questions were addressed. Maximal Sterile Barrier Technique was utilized including caps, mask, sterile gowns, sterile gloves, sterile drape, hand hygiene and skin antiseptic. A timeout was performed prior to the initiation of the procedure. Patient positioned supine on the procedure table. Epigastric and right upper quadrant abdominal wall skin prepped and draped in the usual sterile fashion. Sonographic evaluation of the left hepatic lobe showed mildly dilated ducts. Following local lidocaine administration, a left hepatic lobe duct was successfully accessed with a 21 gauge needle. 21 gauge needle was removed over 0.018 inch guidewire. The guidewire was advanced to the level of the common bile duct. Contrast administered through the  right hepatic drain confirmed appropriate positioning within the right hepatic bile ducts and doudenum. The right-sided 26 French drain was cut and removed over 0.035 inch guidewire and replaced with an 8 [place] sheath. The 8 French sheath was advanced beyond the site of right hepatic and common bile duct stenosis. Two brush biopsies were performed at the stenotic segments of the common and right main hepatic ducts. Both samples were sent to pathology in sterile saline. New 12 French internal external biliary drain was inserted over the 0.035 inch guidewire. The pigtail was formed within the doudenum. Attention was again turned to the left hepatic lobe. Transitional dilator set was inserted over the 0.018 inch guidewire. Kumpe catheter and stiff glidewire were advanced to the level of  the doudenum utilizing fluoroscopic guidance through this access. Kumpe the catheter was removed and a 10 [place] internal external biliary drain was inserted. The distal pigtail was formed within the doudenum. Contrast administered through both drains confirmed appropriate positioning within the doudenum as well as within the intrahepatic bile ducts. Both drains were secured to skin with silk suture and connected to bag. IMPRESSION: 1. Successful brush biopsy of the stenotic segments of the common bile and right main bile ducts. 2. Successful replacement of 12 French right hepatic internal external biliary drain. 3. Successful insertion of 10 French left hepatic internal external biliary drain. Electronically Signed   By: Acquanetta Belling M.D.   On: 02/07/2023 16:36   IR ENDOLUMINAL BX OF BILIARY TREE Result Date: 02/07/2023 INDICATION: 77 year old gentleman with history of biliary obstruction underwent percutaneous cholecystostomy placement on 01/02/2023 and replacement on 01/24/2023. Continued symptoms of jaundice and hyperbilirubinemia prompteda MRI on 01/24/2023. Enhancement of the ductal system was suspicious for cholangitis. Right  hepatic biliary drain was placed on 01/31/2023. 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 Despite placement of the right hepatic drain, hyperbilirubinemia and jaundice has persisted. He returns today for placement of left hepatic biliary drain as well as brush biopsy of the stenosed segment of the right hepatic duct through a pre-existing access. EXAM: 1. Ultrasound and fluoroscopy guided left hepatic internal external biliary drain placement 2. Fluoroscopy guided exchange of existing right hepatic internal external biliary drain 3. Brush biopsy of common and right hepatic bile ducts 4. Bilateral percutaneous transhepatic cholangiogram MEDICATIONS: Rocephin 2 g IV; The antibiotic was administered within an appropriate time frame prior to the initiation of the procedure. Demerol IV 50 mg ANESTHESIA/SEDATION: Moderate (conscious) sedation was employed during this procedure. A total of Versed 3 mg and Fentanyl 75 mcg was administered intravenously by the radiology nurse. Total intra-service moderate Sedation Time: 56 minutes. The patient's level of consciousness and vital signs were monitored continuously by radiology nursing throughout the procedure under my direct supervision. FLUOROSCOPY: Radiation Exposure Index (as provided by the fluoroscopic device): 98 mGy Kerma COMPLICATIONS: None immediate. PROCEDURE: Informed written consent was obtained from the patient after a thorough discussion of the procedural risks, benefits and alternatives. All questions were addressed. Maximal Sterile Barrier Technique was utilized including caps, mask, sterile gowns, sterile gloves, sterile drape, hand hygiene and skin antiseptic. A timeout was performed prior to the initiation of the procedure. Patient positioned supine on the procedure table. Epigastric and right upper quadrant abdominal wall skin prepped and draped in the usual sterile  fashion. Sonographic evaluation of the left hepatic lobe showed mildly dilated ducts. Following local lidocaine administration, a left hepatic lobe duct was successfully accessed with a 21 gauge needle. 21 gauge needle was removed over 0.018 inch guidewire. The guidewire was advanced to the level of the common bile duct. Contrast administered through the right hepatic drain confirmed appropriate positioning within the right hepatic bile ducts and doudenum. The right-sided 25 French drain was cut and removed over 0.035 inch guidewire and replaced with an 8 [place] sheath. The 8 French sheath was advanced beyond the site of right hepatic and common bile duct stenosis. Two brush biopsies were performed at the stenotic segments of the common and right main hepatic ducts. Both samples were sent to pathology in sterile saline. New 12 French internal external biliary drain was inserted over the 0.035 inch guidewire. The pigtail was formed within  the doudenum. Attention was again turned to the left hepatic lobe. Transitional dilator set was inserted over the 0.018 inch guidewire. Kumpe catheter and stiff glidewire were advanced to the level of the doudenum utilizing fluoroscopic guidance through this access. Kumpe the catheter was removed and a 10 [place] internal external biliary drain was inserted. The distal pigtail was formed within the doudenum. Contrast administered through both drains confirmed appropriate positioning within the doudenum as well as within the intrahepatic bile ducts. Both drains were secured to skin with silk suture and connected to bag. IMPRESSION: 1. Successful brush biopsy of the stenotic segments of the common bile and right main bile ducts. 2. Successful replacement of 12 French right hepatic internal external biliary drain. 3. Successful insertion of 10 French left hepatic internal external biliary drain. Electronically Signed   By: Acquanetta Belling M.D.   On: 02/07/2023 16:36   IR ENDOLUMINAL BX  OF BILIARY TREE Result Date: 02/07/2023 INDICATION: 77 year old gentleman with history of biliary obstruction underwent percutaneous cholecystostomy placement on 01/02/2023 and replacement on 01/24/2023. Continued symptoms of jaundice and hyperbilirubinemia prompteda MRI on 01/24/2023. Enhancement of the ductal system was suspicious for cholangitis. Right hepatic biliary drain was placed on 01/31/2023. 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 Despite placement of the right hepatic drain, hyperbilirubinemia and jaundice has persisted. He returns today for placement of left hepatic biliary drain as well as brush biopsy of the stenosed segment of the right hepatic duct through a pre-existing access. EXAM: 1. Ultrasound and fluoroscopy guided left hepatic internal external biliary drain placement 2. Fluoroscopy guided exchange of existing right hepatic internal external biliary drain 3. Brush biopsy of common and right hepatic bile ducts 4. Bilateral percutaneous transhepatic cholangiogram MEDICATIONS: Rocephin 2 g IV; The antibiotic was administered within an appropriate time frame prior to the initiation of the procedure. Demerol IV 50 mg ANESTHESIA/SEDATION: Moderate (conscious) sedation was employed during this procedure. A total of Versed 3 mg and Fentanyl 75 mcg was administered intravenously by the radiology nurse. Total intra-service moderate Sedation Time: 56 minutes. The patient's level of consciousness and vital signs were monitored continuously by radiology nursing throughout the procedure under my direct supervision. FLUOROSCOPY: Radiation Exposure Index (as provided by the fluoroscopic device): 98 mGy Kerma COMPLICATIONS: None immediate. PROCEDURE: Informed written consent was obtained from the patient after a thorough discussion of the procedural risks, benefits and alternatives. All questions were addressed.  Maximal Sterile Barrier Technique was utilized including caps, mask, sterile gowns, sterile gloves, sterile drape, hand hygiene and skin antiseptic. A timeout was performed prior to the initiation of the procedure. Patient positioned supine on the procedure table. Epigastric and right upper quadrant abdominal wall skin prepped and draped in the usual sterile fashion. Sonographic evaluation of the left hepatic lobe showed mildly dilated ducts. Following local lidocaine administration, a left hepatic lobe duct was successfully accessed with a 21 gauge needle. 21 gauge needle was removed over 0.018 inch guidewire. The guidewire was advanced to the level of the common bile duct. Contrast administered through the right hepatic drain confirmed appropriate positioning within the right hepatic bile ducts and doudenum. The right-sided 3 French drain was cut and removed over 0.035 inch guidewire and replaced with an 8 [place] sheath. The 8 French sheath was advanced beyond the site of right hepatic and common bile duct stenosis. Two brush biopsies were performed at the stenotic segments of the common and  right main hepatic ducts. Both samples were sent to pathology in sterile saline. New 12 French internal external biliary drain was inserted over the 0.035 inch guidewire. The pigtail was formed within the doudenum. Attention was again turned to the left hepatic lobe. Transitional dilator set was inserted over the 0.018 inch guidewire. Kumpe catheter and stiff glidewire were advanced to the level of the doudenum utilizing fluoroscopic guidance through this access. Kumpe the catheter was removed and a 10 [place] internal external biliary drain was inserted. The distal pigtail was formed within the doudenum. Contrast administered through both drains confirmed appropriate positioning within the doudenum as well as within the intrahepatic bile ducts. Both drains were secured to skin with silk suture and connected to bag.  IMPRESSION: 1. Successful brush biopsy of the stenotic segments of the common bile and right main bile ducts. 2. Successful replacement of 12 French right hepatic internal external biliary drain. 3. Successful insertion of 10 French left hepatic internal external biliary drain. Electronically Signed   By: Acquanetta Belling M.D.   On: 02/07/2023 16:36   IR EXCHANGE BILIARY DRAIN Result Date: 02/07/2023 INDICATION: 77 year old gentleman with history of biliary obstruction underwent percutaneous cholecystostomy placement on 01/02/2023 and replacement on 01/24/2023. Continued symptoms of jaundice and hyperbilirubinemia prompteda MRI on 01/24/2023. Enhancement of the ductal system was suspicious for cholangitis. Right hepatic biliary drain was placed on 01/31/2023. 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 Despite placement of the right hepatic drain, hyperbilirubinemia and jaundice has persisted. He returns today for placement of left hepatic biliary drain as well as brush biopsy of the stenosed segment of the right hepatic duct through a pre-existing access. EXAM: 1. Ultrasound and fluoroscopy guided left hepatic internal external biliary drain placement 2. Fluoroscopy guided exchange of existing right hepatic internal external biliary drain 3. Brush biopsy of common and right hepatic bile ducts 4. Bilateral percutaneous transhepatic cholangiogram MEDICATIONS: Rocephin 2 g IV; The antibiotic was administered within an appropriate time frame prior to the initiation of the procedure. Demerol IV 50 mg ANESTHESIA/SEDATION: Moderate (conscious) sedation was employed during this procedure. A total of Versed 3 mg and Fentanyl 75 mcg was administered intravenously by the radiology nurse. Total intra-service moderate Sedation Time: 56 minutes. The patient's level of consciousness and vital signs were monitored continuously by radiology  nursing throughout the procedure under my direct supervision. FLUOROSCOPY: Radiation Exposure Index (as provided by the fluoroscopic device): 98 mGy Kerma COMPLICATIONS: None immediate. PROCEDURE: Informed written consent was obtained from the patient after a thorough discussion of the procedural risks, benefits and alternatives. All questions were addressed. Maximal Sterile Barrier Technique was utilized including caps, mask, sterile gowns, sterile gloves, sterile drape, hand hygiene and skin antiseptic. A timeout was performed prior to the initiation of the procedure. Patient positioned supine on the procedure table. Epigastric and right upper quadrant abdominal wall skin prepped and draped in the usual sterile fashion. Sonographic evaluation of the left hepatic lobe showed mildly dilated ducts. Following local lidocaine administration, a left hepatic lobe duct was successfully accessed with a 21 gauge needle. 21 gauge needle was removed over 0.018 inch guidewire. The guidewire was advanced to the level of the common bile duct. Contrast administered through the right hepatic drain confirmed appropriate positioning within the right hepatic bile ducts and doudenum. The right-sided 38 French drain was cut and removed over 0.035 inch guidewire and replaced with an 8 [place] sheath.  The 8 French sheath was advanced beyond the site of right hepatic and common bile duct stenosis. Two brush biopsies were performed at the stenotic segments of the common and right main hepatic ducts. Both samples were sent to pathology in sterile saline. New 12 French internal external biliary drain was inserted over the 0.035 inch guidewire. The pigtail was formed within the doudenum. Attention was again turned to the left hepatic lobe. Transitional dilator set was inserted over the 0.018 inch guidewire. Kumpe catheter and stiff glidewire were advanced to the level of the doudenum utilizing fluoroscopic guidance through this access. Kumpe  the catheter was removed and a 10 [place] internal external biliary drain was inserted. The distal pigtail was formed within the doudenum. Contrast administered through both drains confirmed appropriate positioning within the doudenum as well as within the intrahepatic bile ducts. Both drains were secured to skin with silk suture and connected to bag. IMPRESSION: 1. Successful brush biopsy of the stenotic segments of the common bile and right main bile ducts. 2. Successful replacement of 12 French right hepatic internal external biliary drain. 3. Successful insertion of 10 French left hepatic internal external biliary drain. Electronically Signed   By: Acquanetta Belling M.D.   On: 02/07/2023 16:36   CT ABDOMEN W CONTRAST Result Date: 02/07/2023 CLINICAL DATA:  77 year old male with a history of biliary obstruction. The original ultrasound 12/26/2022 demonstrates evidence of acute cholecystitis, which was present on a follow-up MRI 12/29/2022. Following this a percutaneous cholecystostomy was placed 01/02/2023 and replaced 01/24/2023. Ongoing symptoms of jaundice and hyperbilirubinemia prompted a second MRI 01/24/2023. MR suggested cholangitis given some enhancement along the ductal system, undrained intrahepatic bile ducts. Internal external biliary drain was placed 01/31/2023. 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 A follow-up CT 01/31/2023 and 02/02/2023 performed, with no radiopaque gallstones accounting for biliary obstruction. The patient has had ongoing hyperbilirubinemia which has plateaued at the level of the proximally 14 and remains symptomatic. He presents for this CT study as a planning study preoperatively 4 left-sided biliary drainage. EXAM: CT ABDOMEN WITH CONTRAST TECHNIQUE: Multidetector CT imaging of the abdomen was performed using the standard  protocol following bolus administration of intravenous contrast. RADIATION DOSE REDUCTION: This exam was performed according to the departmental dose-optimization program which includes automated exposure control, adjustment of the mA and/or kV according to patient size and/or use of iterative reconstruction technique. CONTRAST:  75mL OMNIPAQUE IOHEXOL 350 MG/ML SOLN COMPARISON:  Ultrasound 12/26/2022, MR 12/29/2022, per coli 01/02/2023, MR 01/24/2023, CT 01/31/2023, CT 02/02/2023 PTC and drainage 01/31/2023 FINDINGS: Lower chest: No acute finding of the lower chest. Minimal atelectasis and trace fluid. Hepatobiliary: Left-sided intrahepatic biliary ductal dilatation. There is associated periportal edema. There are a few small mildly dilated intrahepatic ducts of the right liver, particularly of the segment 8. Percutaneous transhepatic internal/external biliary drain via right-sided approach remains adequately position with the radiopaque marker in the bile ducts and the term in a shin of the drain in the small bowel. Percutaneous cholecystostomy tube in place within the fundus of the gallbladder. No radiopaque stones. There are no radiopaque stones identified along the course of the internal/external biliary drain. Region of hypodensity/hypoenhancement at the confluence of the left and right biliary ducts, at the liver hilum on image 19 of series 3. This region measures 13 mm x 42 mm on the axial images. No cirrhotic changes Pancreas:  Attenuation/enhancement of the pancreas within normal limits. Mild ductal dilatation of the pancreatic duct. Spleen: Unremarkable Adrenals/Urinary Tract: - Right adrenal gland:  Unremarkable - Left adrenal gland: Unremarkable. - Right kidney: No hydronephrosis, nephrolithiasis, inflammation, or ureteral dilation. No focal lesion. - Left Kidney: No hydronephrosis, nephrolithiasis, inflammation, or ureteral dilation. No focal lesion. Stomach/Bowel: - Stomach: Small hiatal hernia.   Otherwise unremarkable stomach. 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 Internal/external biliary drain terminates in the duodenum. Visualized small bowel and colon unremarkable with no distension. Vascular/Lymphatic: Calcifications of the abdominal aorta. Mesenteric arteries and renal arteries patent. Other: None Musculoskeletal: Unchanged configuration of the visualized vertebral bodies including compression fracture of L1 favored to be chronic. No bony canal narrowing. IMPRESSION: Right-sided internal/external biliary drain in adequate position across the right-sided biliary ducts and into the duodenum. The majority of the right-sided ductal system is decompressed, with some mild residual ductal dilatation involving segment 8. Persistent left-sided intrahepatic biliary ductal dilatation. Nonspecific focus of low attenuation/hypoenhancement at the confluence of the left and right-sided biliary ducts in the liver hilum. This may reflect focal edema, however, a Klatskin tumor cannot be excluded as etiology for the ductal dilatation. Percutaneous cholecystostomy in position in the gallbladder. Aortic Atherosclerosis (ICD10-I70.0). Electronically Signed   By: Gilmer Mor D.O.   On: 02/07/2023 11:21   DG CHEST PORT 1 VIEW Result Date: 02/02/2023 CLINICAL DATA:  Fever EXAM: PORTABLE CHEST 1 VIEW COMPARISON:  12/26/2022 x-ray FINDINGS: Status post median sternotomy. Enlarged cardiopericardial silhouette with calcified tortuous aorta. Prosthetic valve. Overlapping cardiac leads. There is some linear opacity at the bases likely scar or atelectasis. No pneumothorax, effusion or edema. Healed right eighth rib fracture. IMPRESSION: Postop chest with enlarged heart and calcified aorta. Basilar atelectasis Electronically Signed   By: Karen Kays M.D.   On: 02/02/2023 16:08   CT ABDOMEN PELVIS WO CONTRAST Result Date: 02/02/2023 CLINICAL DATA:  Anemia. EXAM: CT ABDOMEN AND  PELVIS WITHOUT CONTRAST TECHNIQUE: Multidetector CT imaging of the abdomen and pelvis was performed following the standard protocol without IV contrast. RADIATION DOSE REDUCTION: This exam was performed according to the departmental dose-optimization program which includes automated exposure control, adjustment of the mA and/or kV according to patient size and/or use of iterative reconstruction technique. COMPARISON:  January 31, 2023. FINDINGS: Lower chest: Minimal bibasilar subsegmental atelectasis is noted. Hepatobiliary: Percutaneous cholecystostomy tube is again noted with decompression of the bladder. Stable position of internal external biliary drain with distal tip in duodenum. Stable mild left hepatic biliary dilatation with periportal edema. Pancreas: Unremarkable. No pancreatic ductal dilatation or surrounding inflammatory changes. Spleen: Normal in size without focal abnormality. Adrenals/Urinary Tract: Adrenal glands are unremarkable. Kidneys are normal, without renal calculi, focal lesion, or hydronephrosis. Bladder is unremarkable. Stomach/Bowel: Stomach is within normal limits. Appendix appears normal. No evidence of bowel wall thickening, distention, or inflammatory changes. Vascular/Lymphatic: Aortic atherosclerosis. No enlarged abdominal or pelvic lymph nodes. Reproductive: Mild prostatic enlargement is noted. Other: No abdominal wall hernia or abnormality. No abdominopelvic ascites. Musculoskeletal: Old L1 and L5 fractures are again noted. No acute abnormality seen. IMPRESSION: Percutaneous cholecystostomy tube is again noted. Stable position of internal external biliary drain with distal tip in duodenum. Stable mild left hepatic biliary dilatation with periportal edema. Electronically Signed   By: Lupita Raider M.D.   On: 02/02/2023 13:43   CT ABDOMEN PELVIS W CONTRAST Result Date: 01/31/2023 CLINICAL DATA:  Gallbladder and biliary cancer. EXAM: CT ABDOMEN AND PELVIS WITH CONTRAST  TECHNIQUE: Multidetector CT imaging of the abdomen and pelvis was performed using the standard protocol following bolus administration of intravenous contrast. RADIATION DOSE REDUCTION: This exam was performed according to the departmental dose-optimization program which includes automated exposure control, adjustment of the mA and/or kV according to patient size and/or use of iterative reconstruction technique. CONTRAST:  75mL OMNIPAQUE IOHEXOL 350 MG/ML SOLN COMPARISON:  MRI abdomen 01/24/2023. FINDINGS: Lower chest: There is atelectasis in the lung bases with trace bilateral pleural effusions. Hepatobiliary: Transhepatic percutaneous biliary catheter present with distal tip in the duodenal. There is mild intrahepatic biliary ductal dilatation in the left lobe of the liver with some periportal edema similar to prior MRI given differences in technique. There is a small amount of pneumobilia and contrast throughout the biliary tree likely related to recent procedure. No focal liver lesions are seen. Percutaneous cholecystostomy tube present. There some contrast in the gallbladder. The gallbladder is decompressed and there is diffuse wall thickening similar to prior. Pancreas: Unremarkable. No pancreatic ductal dilatation or surrounding inflammatory changes. Spleen: Normal in size without focal abnormality. Adrenals/Urinary Tract: Adrenal glands are unremarkable. Kidneys are normal, without renal calculi, focal lesion, or hydronephrosis. Bladder is unremarkable. Stomach/Bowel: Stomach is within normal limits. Appendix appears normal. No evidence of bowel wall thickening, distention, or inflammatory changes. There is sigmoid colon diverticulosis. Vascular/Lymphatic: No significant vascular findings are present. No enlarged abdominal or pelvic lymph nodes. Reproductive: Prostate gland is enlarged. Other: There is presacral edema. There is trace free fluid in the right upper quadrant similar to prior. There is no focal  abdominal wall hernia. Musculoskeletal: There are compression deformities of T8, T9, T11, L1 and L5 which are favored as chronic, but age indeterminate. The bones are diffusely osteopenic. There is mild body wall edema. IMPRESSION: 1. Transhepatic percutaneous biliary catheter present with distal tip in the duodenum. 2. Percutaneous cholecystostomy tube present. Gallbladder is decompressed with diffuse wall thickening similar to prior. 3. Mild intrahepatic biliary ductal dilatation in the left lobe of the liver with periportal edema similar to prior MRI. 4. Trace free fluid in the right upper quadrant similar to prior. 5. Trace bilateral pleural effusions. 6. Mild body wall edema. 7. Multiple compression deformities of the thoracic and lumbar spine are favored as chronic, but age indeterminate. Correlate clinically. Electronically Signed   By: Darliss Cheney M.D.   On: 01/31/2023 20:37   IR BILIARY DRAIN PLACEMENT WITH CHOLANGIOGRAM Result Date: 01/31/2023 INDICATION: 77 year old male with biliary obstruction, presents for percutaneous transhepatic cholangiogram and internal/external drain placement EXAM: IMAGE GUIDED PERCUTANEOUS TRANSHEPATIC CHOLANGIOGRAM INTERNAL/EXTERNAL BILIARY DRAIN PLACEMENT MEDICATIONS: 2 g Mefoxin; The antibiotic was administered within an appropriate time frame prior to the initiation of the procedure. ANESTHESIA/SEDATION: Moderate (conscious) sedation was employed during this procedure. A total of Versed 2 mg and Fentanyl 150 mcg was administered intravenously by the radiology nurse. Total intra-service moderate Sedation Time: 43 minutes. The patient's level of consciousness and vital signs were monitored continuously by radiology nursing throughout the procedure under my direct supervision. FLUOROSCOPY: Radiation Exposure Index (as provided by the fluoroscopic device): 282 mGy Kerma COMPLICATIONS: None PROCEDURE: The procedure, risks, benefits, and alternatives were explained to the  patient and the patient's family. A complete informed consent was performed, with risk benefit analysis. Specific risks that were discussed for the procedure include bleeding, infection, biliary sepsis, IC use day, organ injury, need for further procedure, need for further surgery, long-term drain placement, cardiopulmonary collapse, death. Questions regarding the procedure were encouraged and answered. The  patient understands and consents to the procedure. Patient is position in supine position on the fluoroscopy table, and the upper abdomen was prepped and draped in the usual sterile fashion. Maximum barrier sterile technique with sterile gowns and gloves were used for the procedure. A timeout was performed prior to the initiation of the procedure. Local anesthesia was provided with 1% lidocaine with epinephrine. Ultrasound survey of the left liver lobe was performed, with then ultrasound of the right liver lobe. We elected to access transhepatic biliary system via the right. 1% lidocaine was used for local anesthesia, with generous infiltration of the skin and subcutaneous tissues in and inter left costal location. A Chiba needle was advanced under ultrasound guidance into the right liver lobe, targeting biliary system. Once the tip of the needle was confirmed within the biliary system by injecting small aliquots of contrast, images were stored of the biliary system after partially opacifying the biliary tree via the needle. An 018 wire was then advanced centrally. The needle was removed, a small incision was made with an 11 blade scalpel, and then a triaxial Bard system was advanced into the biliary system. The metal stiffener and dilator were removed, we confirmed placement with contrast infusion. We then attempted to navigate through stenotic ductal system at the hilum, presumably the common hepatic duct. A coaxial Glidewire and 4 French glide cath were then used to attempt to navigate across the obstruction  at the hilum of the liver. A combination of the glide cath, Glidewire, and a roadrunner wire were required to access the common hepatic duct. Once the catheter and wire were advanced into the external hepatic ducts, further images were acquired. This demonstrated that the duodenal diverticulum was distorting the anatomy at the ampulla. After some initial attempts fail to cross the ampulla, a coon's wire was placed and then a 6 French 35 cm straight sheath was placed into the common bile duct. At this point we were able to navigate the angled diagnostic catheter and the Glidewire through the ampulla into the duodenum. With the diagnostic catheter in the second portion the duodenum, the coon's wire was then placed. Twelve French dilation of the subcutaneous tissue tracks was performed, and then a 16 [place] biliary drain was placed as an internal/external biliary drain. Small amount of contrast confirmed location. The patient tolerated the procedure well and remained hemodynamically stable throughout. No complications were encountered and no significant blood loss was encountered. FINDINGS: Percutaneous transhepatic cholangiogram demonstrates significant stenotic narrowing in the bile ducts at the hilum of the liver, suspicious for malignancy/[place] tumor IMPRESSION: Status post image guided percutaneous transhepatic cholangiogram with internal/external biliary drain placement. Injection of the percutaneous cholecystostomy tube demonstrates that the cystic duct remains occluded. Signed, Yvone Neu. Miachel Roux, RPVI [facility] Electronically Signed   By: Gilmer Mor D.O.   On: 01/31/2023 16:46   DG C-Arm 1-60 Min-No Report Result Date: 01/29/2023 Fluoroscopy was utilized by the requesting physician.  No radiographic interpretation.   MR ABDOMEN MRCP W WO CONTAST Result Date: 01/24/2023 CLINICAL DATA:  Jaundice. History of acute cholecystitis  status post percutaneous cholecystostomy tube placement 01/02/2023 EXAM: MRI ABDOMEN WITHOUT AND WITH CONTRAST (INCLUDING MRCP) TECHNIQUE: Multiplanar multisequence MR imaging of the abdomen was performed both before and after the administration of intravenous contrast. Heavily T2-weighted images of the biliary and pancreatic ducts were obtained. Post-processing was applied at the acquisition scanner with concurrent physician supervision which includes 3D reconstructions, MIPs, volume rendered images  and/or shaded surface rendering. CONTRAST:  8.23mL GADAVIST GADOBUTROL 1 MMOL/ML IV SOLN COMPARISON:  MR abdomen dated 12/29/2022 FINDINGS: Decreased sensitivity and specificity for detailed findings due to motion artifact. Lower chest: Trace bilateral pleural effusions. Hepatobiliary: Parenchymal signal abnormality. Scattered subcentimeter T2 hyperintense foci, likely cysts. Increased mild to moderate intrahepatic bile duct dilation more prominent in the left hepatic lobe with new mural thickening of the extrahepatic bile duct (4:16) and enhancement (1304:45). The common bile duct is nondilated. Gallbladder is decompressed with catheter in-situ and demonstrates circumferential mural thickening. Pancreas: No mass or inflammatory changes. 3 mm T2 hyperintense cystic focus arises directly from the main pancreatic duct in the pancreatic neck (6:20) keeping with side branch intraductal papillary mucinous neoplasms (IPMN). No main ductal dilation, mass lesion, or abnormal enhancement. Spleen:  Within normal limits in size and appearance. Adrenals/Urinary Tract: No adrenal nodules. No suspicious renal masses identified. No evidence of hydronephrosis. Stomach/Bowel: Small proximal duodenal diverticulum. Colonic diverticulosis without acute diverticulitis. Vascular/Lymphatic: No pathologically enlarged lymph nodes identified. No abdominal aortic aneurysm demonstrated. Other:  Trace ascites. Musculoskeletal: No suspicious bone  lesions identified. Susceptibility artifact related to median sternotomy wires. IMPRESSION: 1. Increased mild to moderate intrahepatic bile duct dilation, more prominent in the left hepatic lobe, with new mural thickening and enhancement of the extrahepatic bile duct, suspicious for cholangitis. 2. Decompressed gallbladder with catheter in-situ and circumferential mural thickening. 3. Trace bilateral pleural effusions and trace ascites. Electronically Signed   By: Agustin Cree M.D.   On: 01/24/2023 15:54   MR 3D Recon At Scanner Result Date: 01/24/2023 CLINICAL DATA:  Jaundice. History of acute cholecystitis status post percutaneous cholecystostomy tube placement 01/02/2023 EXAM: MRI ABDOMEN WITHOUT AND WITH CONTRAST (INCLUDING MRCP) TECHNIQUE: Multiplanar multisequence MR imaging of the abdomen was performed both before and after the administration of intravenous contrast. Heavily T2-weighted images of the biliary and pancreatic ducts were obtained. Post-processing was applied at the acquisition scanner with concurrent physician supervision which includes 3D reconstructions, MIPs, volume rendered images and/or shaded surface rendering. CONTRAST:  8.39mL GADAVIST GADOBUTROL 1 MMOL/ML IV SOLN COMPARISON:  MR abdomen dated 12/29/2022 FINDINGS: Decreased sensitivity and specificity for detailed findings due to motion artifact. Lower chest: Trace bilateral pleural effusions. Hepatobiliary: Parenchymal signal abnormality. Scattered subcentimeter T2 hyperintense foci, likely cysts. Increased mild to moderate intrahepatic bile duct dilation more prominent in the left hepatic lobe with new mural thickening of the extrahepatic bile duct (4:16) and enhancement (1304:45). The common bile duct is nondilated. Gallbladder is decompressed with catheter in-situ and demonstrates circumferential mural thickening. Pancreas: No mass or inflammatory changes. 3 mm T2 hyperintense cystic focus arises directly from the main pancreatic duct  in the pancreatic neck (6:20) keeping with side branch intraductal papillary mucinous neoplasms (IPMN). No main ductal dilation, mass lesion, or abnormal enhancement. Spleen:  Within normal limits in size and appearance. Adrenals/Urinary Tract: No adrenal nodules. No suspicious renal masses identified. No evidence of hydronephrosis. Stomach/Bowel: Small proximal duodenal diverticulum. Colonic diverticulosis without acute diverticulitis. Vascular/Lymphatic: No pathologically enlarged lymph nodes identified. No abdominal aortic aneurysm demonstrated. Other:  Trace ascites. Musculoskeletal: No suspicious bone lesions identified. Susceptibility artifact related to median sternotomy wires. IMPRESSION: 1. Increased mild to moderate intrahepatic bile duct dilation, more prominent in the left hepatic lobe, with new mural thickening and enhancement of the extrahepatic bile duct, suspicious for cholangitis. 2. Decompressed gallbladder with catheter in-situ and circumferential mural thickening. 3. Trace bilateral pleural effusions and trace ascites. Electronically Signed   By: Milus Height.D.  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 Patient now with worsening hyperbilirubinemia and jaundice and as such presents for cholangiogram and potential cholecystostomy tube exchange. EXAM: FLUOROSCOPIC GUIDED CHOLECYSTOSTOMY TUBE EXCHANGE COMPARISON:  MRCP-12/29/2022 Image guided cholecystostomy tube placement-01/02/2023 MEDICATIONS: None ANESTHESIA/SEDATION: None CONTRAST:  15mL OMNIPAQUE IOHEXOL 300 MG/ML SOLN - administered into the gallbladder lumen. FLUOROSCOPY TIME:  1 minute, 6 seconds (6.1 mGy) COMPLICATIONS: None immediate. PROCEDURE: The patient was positioned supine on the fluoroscopy table. The external portion of the existing  cholecystostomy tube as well as the surrounding skin was prepped and draped in usual sterile fashion. A time-out was performed prior to the initiation of the procedure. A preprocedural spot fluoroscopic image was obtained of the right upper abdominal quadrant existing cholecystostomy tube. The skin surrounding the cholecystostomy tube was anesthetized with 1% lidocaine with epinephrine. The external portion of the cholecystostomy tube was cut and cannulated with a short Amplatz wire which was advanced through the tube and coiled within the gallbladder lumen Next, under intermittent fluoroscopic guidance, the existing 10 [place] cholecystostomy tube was exchanged for a new, slightly larger now 10 [place] cholecystostomy tube which was repositioned into the more central aspect of the gallbladder lumen. Contrast injection confirms appropriate positioning and functionality of the cholecystomy tube. The cholecystostomy tube was flushed with a small amount of saline and reconnected to a gravity bag. The cholecystostomy tube was secured with an interrupted suture and a Stat Lock device. A dressing was applied. The patient tolerated the procedure well without immediate postprocedural complication. FINDINGS: Preprocedural spot fluoroscopic image demonstrates unchanged positioning of cholecystostomy tube with end coiled and locked over the expected location of the fundus of the gallbladder. Contrast injection demonstrates appropriate positioning and functionality of the existing cholecystostomy tube. There is opacification of the peripheral aspect of the cystic duct without passage of contrast to the level of the CBD. After fluoroscopic guided exchange, the new, 10 [place] cholecystostomy tube is appropriately positioned within the gallbladder lumen. Post exchange cholangiogram demonstrates appropriate positioning and functionality of the new cholecystostomy tube. IMPRESSION: Successful fluoroscopic guided exchange of 10 French  cholecystostomy tube. PLAN: - Cholangiogram demonstrates persistent occlusion of the cystic duct without opacification of the CBD. 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 The patient is visibly jaundiced with worsening hyperbilirubinemia (bilirubin - 3.9 on 11/11, 0.6 on the day of cholecystostomy tube placement) and as such the decision was made to escort the patient to the emergency department for further evaluation and management as the patient will likely require an ERCP to relieve his worsening biliary obstruction. Electronically Signed   By: Simonne Come M.D.   On: 01/24/2023 11:06    Microbiology: Recent Results (from the past 240 hours)  Culture, blood (Routine X 2) w Reflex to ID Panel     Status: None   Collection Time: 02/13/23 11:22 AM   Specimen: BLOOD LEFT HAND  Result Value Ref Range Status   Specimen Description BLOOD LEFT HAND  Final   Special Requests   Final    BOTTLES DRAWN AEROBIC AND ANAEROBIC Blood Culture results may not be optimal due to an inadequate volume of blood received in culture bottles   Culture   Final    NO GROWTH 5 DAYS Performed at [facility], [address], [place], [place] [address]  Report Status 02/18/2023 FINAL  Final  Culture, blood (Routine X 2) w Reflex to ID Panel     Status: None   Collection Time: 02/13/23 11:22 AM   Specimen: BLOOD RIGHT HAND  Result Value Ref Range Status   Specimen Description BLOOD RIGHT HAND  Final   Special Requests   Final    BOTTLES DRAWN AEROBIC AND ANAEROBIC Blood Culture results may not be optimal due to an inadequate volume of blood received in culture bottles   Culture   Final    NO GROWTH 5 DAYS Performed at [facility], [address] [place], [place] [address]    Report Status 02/18/2023 FINAL  Final  MRSA Next Gen by PCR, Nasal     Status: None   Collection Time: 02/13/23  7:56 PM   Specimen: Nasal  Mucosa; Nasal Swab  Result Value Ref Range Status   MRSA by PCR Next Gen NOT DETECTED NOT DETECTED Final    Comment: (NOTE) The GeneXpert MRSA Assay (FDA approved for NASAL specimens only), is one component of a comprehensive MRSA colonization surveillance program. It is not intended to diagnose MRSA infection nor to guide or monitor treatment for MRSA infections. Test performance is not FDA approved in patients less than 32 years old. Performed at [facility], [address] [place], [place] [address]      Labs: Basic Metabolic Panel: Recent Labs  Lab 02/17/23 0205 02/18/23 0981 02/19/23 0220 02/20/23 0204 02/21/23 0215  NA 128* 130* 129* 129* 132*  K 4.6 4.7 5.0 4.8 4.9  CL 94* 95* 93* 93* 95*  CO2 26 24 24 26 27   GLUCOSE 122* 108* 91 127* 108*  BUN 41* 47* 55* 57* 46*  CREATININE 1.11 1.19 1.44* 1.30* 1.06  CALCIUM 8.7* 9.1 8.8* 8.5* 8.7*   Liver Function Tests: Recent Labs  Lab 02/17/23 0205 02/18/23 0642 02/19/23 0220 02/20/23 0204 02/21/23 0215  AST 114* 145* 163* 144* 148*  ALT 83* 94* 99* 91* 90*  ALKPHOS 253* 274* 240* 235* 244*  BILITOT 9.0* 10.0* 9.8* 8.9* 9.1*  PROT 5.1* 5.4* 5.2* 5.1* 5.5*  ALBUMIN 1.9* 2.0* 2.0* 2.0* 2.1*   No results for input(s): "LIPASE", "AMYLASE" in the last 168 hours. No results for input(s): "AMMONIA" in the last 168 hours. CBC: Recent Labs  Lab 02/17/23 0901 02/18/23 0212  WBC 8.8 8.7  NEUTROABS 6.1  --   HGB 9.9* 9.2*  HCT 30.0* 27.0*  MCV 106.4* 104.7*  PLT 359 291   Cardiac Enzymes: No results for input(s): "CKTOTAL", "CKMB", "CKMBINDEX", "TROPONINI" in the last 168 hours. BNP: BNP (last 3 results) No results for input(s): "BNP" in the last 8760 hours.  ProBNP (last 3 results) No results for input(s): "PROBNP" in the last 8760 hours.  CBG: No results for input(s): "GLUCAP" in the last 168 hours.     Signed:  [name] MD   [facility] 02/21/2023, 8:56 AM

## 2023-02-22 LAB — COMPREHENSIVE METABOLIC PANEL
ALT: 85 U/L — ABNORMAL HIGH (ref 0–44)
AST: 137 U/L — ABNORMAL HIGH (ref 15–41)
Albumin: 2.1 g/dL — ABNORMAL LOW (ref 3.5–5.0)
Alkaline Phosphatase: 220 U/L — ABNORMAL HIGH (ref 38–126)
Anion gap: 8 (ref 5–15)
BUN: 38 mg/dL — ABNORMAL HIGH (ref 8–23)
CO2: 26 mmol/L (ref 22–32)
Calcium: 8.5 mg/dL — ABNORMAL LOW (ref 8.9–10.3)
Chloride: 98 mmol/L (ref 98–111)
Creatinine, Ser: 1.16 mg/dL (ref 0.61–1.24)
GFR, Estimated: >60 mL/min (ref >60)
Glucose, Bld: 95 mg/dL (ref 70–99)
Potassium: 4.5 mmol/L (ref 3.5–5.1)
Sodium: 132 mmol/L — ABNORMAL LOW (ref 135–145)
Total Bilirubin: 8.8 mg/dL — ABNORMAL HIGH (ref <1.2)
Total Protein: 5.3 g/dL — ABNORMAL LOW (ref 6.5–8.1)

## 2023-02-22 NOTE — TOC Progression Note (Signed)
Transition of Care [facility]) - Progression Note    Patient Details  Name: Samuel Mayo MRN: [id number] Date of Birth: 26-Aug-1945  [facility] CM/SW Contact  Donnalee Curry, LCSWA Phone Number: 02/22/2023, 10:08 AM  Clinical Narrative:     SW spoke with [facility] [phone]) Berkley Harvey is currently under MD review.    Expected Discharge Plan: Skilled Nursing Facility Barriers to Discharge: Insurance Authorization  Expected Discharge Plan and Services In-house Referral: Clinical Social Work Discharge Planning Services: CM Consult Post Acute Care Choice: Skilled Nursing Facility Living arrangements for the past 2 months: Single Family Home Expected Discharge Date: 02/21/23                                     Social Determinants of Health (SDOH) Interventions SDOH Screenings   Food Insecurity: No Food Insecurity (01/25/2023)  Housing: Patient Declined (01/25/2023)  Transportation Needs: No Transportation Needs (01/25/2023)  Utilities: Not At Risk (01/25/2023)  Alcohol Screen: Low Risk  (10/21/2022)  Depression (PHQ2-9): High Risk (01/13/2023)  Financial Resource Strain: Low Risk  (10/21/2022)  Physical Activity: Insufficiently Active (10/21/2022)  Social Connections: Moderately Integrated (10/21/2022)  Stress: Stress Concern Present (10/21/2022)  Tobacco Use: Low Risk  (01/31/2023)    Readmission Risk Interventions     No data to display

## 2023-02-22 NOTE — Plan of Care (Signed)
  Problem: Nutrition: Goal: Adequate nutrition will be maintained Outcome: Progressing   Problem: Pain Management: Goal: General experience of comfort will improve Outcome: Progressing   Problem: Safety: Goal: Ability to remain free from injury will improve Outcome: Progressing

## 2023-02-22 NOTE — Progress Notes (Signed)
   02/22/23 1356  Mobility  Activity Ambulated with assistance in hallway  Level of Assistance Standby assist, set-up cues, supervision of patient - no hands on  Assistive Device Front wheel walker  Activity Response Tolerated well  Mobility Referral Yes  Mobility visit 1 Mobility  Mobility Specialist Start Time (ACUTE ONLY) 1338  Mobility Specialist Stop Time (ACUTE ONLY) 1356  Mobility Specialist Time Calculation (min) (ACUTE ONLY) 18 min   Mobility Specialist: Progress Note  Pt agreeable to mobility session - received in bed. Required SB using RW. Pt was asymptomatic throughout session with no complaints. Returned to bed with all needs met - call bell within reach.  RN and son present  Barnie Mort, BS Mobility Specialist Please contact via SecureChat or Rehab office at [phone].

## 2023-02-22 NOTE — Progress Notes (Signed)
   02/22/23 1527  Mobility  Activity Transferred from chair to bed  Level of Assistance Standby assist, set-up cues, supervision of patient - no hands on  Assistive Device Front wheel walker  Distance Ambulated (ft) 3 ft  Activity Response Tolerated well  Mobility Referral Yes  Mobility visit 1 Mobility  Mobility Specialist Start Time (ACUTE ONLY) 1521  Mobility Specialist Stop Time (ACUTE ONLY) 1527  Mobility Specialist Time Calculation (min) (ACUTE ONLY) 6 min   Mobility Specialist: Progress Note  MS answered request from call light - Pt agreeable to mobility session, requested to "go back to bed " - received in chair. Required SB using RW.  Pt was asymptomatic throughout session with no complaints. Returned to bed with all needs met - call bell within reach.   Barnie Mort, BS Mobility Specialist Please contact via SecureChat or Rehab office at [phone].

## 2023-02-22 NOTE — Progress Notes (Signed)
No new changes to meds or overall plan Creatinine continues to trend down.  Will stop labs Answered q's from son re: bilirubin and effects--unclear at this time if will drop below where it had been  Waiting for auth to go to skilled--he walked a bit around the room today, no fevers chills n/v  Pleas Koch, MD [facility] 2:40 PM

## 2023-02-22 NOTE — Plan of Care (Signed)

## 2023-02-23 ENCOUNTER — Other Ambulatory Visit: Payer: Self-pay | Admitting: Neurology

## 2023-02-23 DIAGNOSIS — K831 Obstruction of bile duct: Secondary | ICD-10-CM | POA: Diagnosis not present

## 2023-02-23 NOTE — Plan of Care (Signed)

## 2023-02-23 NOTE — Progress Notes (Signed)
HOSPITALIST ROUNDING NOTE Samuel Mayo [id number]  DOB: 1945/08/31  DOA: 01/24/2023  PCP: [name], [name], MD  02/23/2023,3:59 PM   LOS: 28 days      Code Status: DNR From: Home  current Dispo: Unclear     77 year old male Known mitral regurgitation status post annuloplasty, atrial flutter fib on Eliquis CHADVASC >4 HTN Seizures Diagnosed with acute cholecystitis 12/30/2022 had a RUQ drain placed on 10/21-was sent home on Augmentin and discharged 11/1 He followed up with general surgeon and he also had an endoscopy secondary to persistently elevated LFT and was --- he was evaluated by IR on 11/22 for reevaluation of gallbladder issues-and during imaging he was noted to have persistent occlusion of cystic duct without opacification 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 and patient was told to come to the emergency room 11/20 3 GI consulted 11/27 ERCP unsuccessful unable to enter bile duct 11/29 IR placed biliary drain concern for Klatskin's tumor 11/28 nephrology consulted for hyponatremia workup consistent with SIADH 11/30 seen by general surgery recommended to go to [facility] hepatobiliary [facility] 12/1 hypotension-palliative care consulted 12/6 left-sided drain placed by IR growing Pseudomonas blood culture  12/9 oncology consulted secondary to CA 19-9 939- 12/12 upsize of drain right-sided biliary LFTs 12/20 discharge but await auth   Plan  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 Mosetta Putt to follow --may need second opinion at [facility] Pain control tramadol every 6 as needed moderate, Dilaudid IV for severe pain 0.5 every 4 Periodic labs as still hospitalized  Sepsis on admission secondary to cholangitis with Pseudomonas bacteremia Recent acute cholecystitis with cholecystotomy and drain upsize 11/22 Completed Levaquin Flagyl 12/18 Rpt Blood culture X212/12negatif-- cleared positive Pseudomonas blood cultures from 12/6  AKI Kidney  function remains low using encouraged to drink more fluids--force oral fluids at d/c Saline locked and labs have improved  Paroxysmal A-fib CHADVASC >4 mitral valve regurg Orthostatic hypotension-now on midodrine 10 3 times daily Hypotension precludes GDMT-held off of metoprolol XL 25 and amlodipine 5  Previous seizure disorder Continues on Depakote 750 twice daily   DVT prophylaxis: Apixaban  Status is: Inpatient Remains inpatient appropriate because:   Await improvement of kidney function    Subjective:  Looks fair Seems depressed Otherwise no complaints   Objective + exam Vitals:   02/22/23 1804 02/22/23 2044 02/23/23 0520 02/23/23 0822  BP: 121/83 (!) 112/92 114/85 124/80  Pulse: 88 (!) 52 95 81  Resp: 19 18 18 18   Temp: 97.7 F (36.5 C) (!) 97.5 F (36.4 C) 97.9 F (36.6 C) 97.7 F (36.5 C)  TempSrc: Oral Oral Oral   SpO2:  (!) 69% 100% 100%  Weight:      Height:       Filed Weights   02/05/23 0223 02/11/23 1055 02/21/23 0420  Weight: 82.5 kg 83.5 kg 81.4 kg    Examination:  Icteric white male  Chest clear no wheeze Abdomen soft-did not do full exam today ROM intact No lower extremity edema Neuro intact  Data Reviewed: reviewed   CBC    Component Value Date/Time   WBC 8.7 02/18/2023 0212   RBC 2.58 (L) 02/18/2023 0212   HGB 9.2 (L) 02/18/2023 0212   HGB 13.6 05/20/2022 1026   HCT 27.0 (L) 02/18/2023 0212   HCT 39.3 05/20/2022 1026   PLT 291 02/18/2023 0212   PLT 201 05/20/2022 1026   MCV 104.7 (H) 02/18/2023 0212   MCV 98 (H) 05/20/2022 1026   MCV 96 02/03/2012  1915   MCH 35.7 (H) 02/18/2023 0212   MCHC 34.1 02/18/2023 0212   RDW 15.1 02/18/2023 0212   RDW 11.9 05/20/2022 1026   RDW 12.8 02/03/2012 1915   LYMPHSABS 1.1 02/17/2023 0901   MONOABS 1.1 (H) 02/17/2023 0901   EOSABS 0.1 02/17/2023 0901   BASOSABS 0.1 02/17/2023 0901      Latest Ref Rng & Units 02/22/2023    6:01 AM 02/21/2023    2:15 AM 02/20/2023    2:04 AM  CMP   Glucose 70 - 99 mg/dL 95  563  875   BUN 8 - 23 mg/dL 38  46  57   Creatinine 0.61 - 1.24 mg/dL 6.43  3.29  5.18   Sodium 135 - 145 mmol/L 132  132  129   Potassium 3.5 - 5.1 mmol/L 4.5  4.9  4.8   Chloride 98 - 111 mmol/L 98  95  93   CO2 22 - 32 mmol/L 26  27  26    Calcium 8.9 - 10.3 mg/dL 8.5  8.7  8.5   Total Protein 6.5 - 8.1 g/dL 5.3  5.5  5.1   Total Bilirubin <1.2 mg/dL 8.8  9.1  8.9   Alkaline Phos 38 - 126 U/L 220  244  235   AST 15 - 41 U/L 137  148  144   ALT 0 - 44 U/L 85  90  91      Scheduled Meds:  (feeding supplement) PROSource Plus  30 mL Oral BID BM   sodium chloride   Intravenous Once   apixaban  5 mg Oral BID   divalproex  750 mg Oral BID   feeding supplement  237 mL Oral QID   midodrine  10 mg Oral TID WC   multivitamin with minerals  1 tablet Oral Daily   sodium chloride flush  10 mL Intracatheter Q8H   sodium chloride flush  3 mL Intravenous Q12H   sodium chloride flush  5 mL Intracatheter Q8H   sodium chloride flush  5 mL Intracatheter Q8H   traZODone  50 mg Oral QHS   Continuous Infusions:    Time 26  [name], MD  [facility]

## 2023-02-24 DIAGNOSIS — Z711 Person with feared health complaint in whom no diagnosis is made: Secondary | ICD-10-CM | POA: Diagnosis not present

## 2023-02-24 DIAGNOSIS — Z79899 Other long term (current) drug therapy: Secondary | ICD-10-CM | POA: Diagnosis not present

## 2023-02-24 DIAGNOSIS — E440 Moderate protein-calorie malnutrition: Secondary | ICD-10-CM | POA: Diagnosis not present

## 2023-02-24 DIAGNOSIS — N179 Acute kidney failure, unspecified: Secondary | ICD-10-CM | POA: Diagnosis not present

## 2023-02-24 DIAGNOSIS — Z7189 Other specified counseling: Secondary | ICD-10-CM | POA: Diagnosis not present

## 2023-02-24 DIAGNOSIS — D539 Nutritional anemia, unspecified: Secondary | ICD-10-CM | POA: Diagnosis not present

## 2023-02-24 DIAGNOSIS — M6281 Muscle weakness (generalized): Secondary | ICD-10-CM | POA: Diagnosis not present

## 2023-02-24 DIAGNOSIS — R7989 Other specified abnormal findings of blood chemistry: Secondary | ICD-10-CM | POA: Diagnosis not present

## 2023-02-24 DIAGNOSIS — Z66 Do not resuscitate: Secondary | ICD-10-CM | POA: Diagnosis not present

## 2023-02-24 DIAGNOSIS — Z515 Encounter for palliative care: Secondary | ICD-10-CM | POA: Diagnosis not present

## 2023-02-24 DIAGNOSIS — K5730 Diverticulosis of large intestine without perforation or abscess without bleeding: Secondary | ICD-10-CM | POA: Diagnosis not present

## 2023-02-24 DIAGNOSIS — G40909 Epilepsy, unspecified, not intractable, without status epilepticus: Secondary | ICD-10-CM | POA: Diagnosis present

## 2023-02-24 DIAGNOSIS — M810 Age-related osteoporosis without current pathological fracture: Secondary | ICD-10-CM | POA: Diagnosis not present

## 2023-02-24 DIAGNOSIS — C799 Secondary malignant neoplasm of unspecified site: Secondary | ICD-10-CM | POA: Diagnosis not present

## 2023-02-24 DIAGNOSIS — C221 Intrahepatic bile duct carcinoma: Secondary | ICD-10-CM | POA: Diagnosis not present

## 2023-02-24 DIAGNOSIS — Z743 Need for continuous supervision: Secondary | ICD-10-CM | POA: Diagnosis not present

## 2023-02-24 DIAGNOSIS — R5381 Other malaise: Secondary | ICD-10-CM | POA: Diagnosis not present

## 2023-02-24 DIAGNOSIS — C787 Secondary malignant neoplasm of liver and intrahepatic bile duct: Secondary | ICD-10-CM | POA: Diagnosis not present

## 2023-02-24 DIAGNOSIS — R569 Unspecified convulsions: Secondary | ICD-10-CM | POA: Diagnosis not present

## 2023-02-24 DIAGNOSIS — G893 Neoplasm related pain (acute) (chronic): Secondary | ICD-10-CM | POA: Diagnosis not present

## 2023-02-24 DIAGNOSIS — R17 Unspecified jaundice: Secondary | ICD-10-CM | POA: Diagnosis not present

## 2023-02-24 DIAGNOSIS — K922 Gastrointestinal hemorrhage, unspecified: Secondary | ICD-10-CM | POA: Diagnosis not present

## 2023-02-24 DIAGNOSIS — Z8249 Family history of ischemic heart disease and other diseases of the circulatory system: Secondary | ICD-10-CM | POA: Diagnosis not present

## 2023-02-24 DIAGNOSIS — K819 Cholecystitis, unspecified: Secondary | ICD-10-CM | POA: Diagnosis not present

## 2023-02-24 DIAGNOSIS — I10 Essential (primary) hypertension: Secondary | ICD-10-CM | POA: Diagnosis not present

## 2023-02-24 DIAGNOSIS — R1111 Vomiting without nausea: Secondary | ICD-10-CM | POA: Diagnosis not present

## 2023-02-24 DIAGNOSIS — I4892 Unspecified atrial flutter: Secondary | ICD-10-CM | POA: Diagnosis not present

## 2023-02-24 DIAGNOSIS — M109 Gout, unspecified: Secondary | ICD-10-CM | POA: Diagnosis not present

## 2023-02-24 DIAGNOSIS — R2689 Other abnormalities of gait and mobility: Secondary | ICD-10-CM | POA: Diagnosis not present

## 2023-02-24 DIAGNOSIS — E559 Vitamin D deficiency, unspecified: Secondary | ICD-10-CM | POA: Diagnosis not present

## 2023-02-24 DIAGNOSIS — R531 Weakness: Secondary | ICD-10-CM | POA: Diagnosis not present

## 2023-02-24 DIAGNOSIS — Z800 Family history of malignant neoplasm of digestive organs: Secondary | ICD-10-CM | POA: Diagnosis not present

## 2023-02-24 DIAGNOSIS — R1110 Vomiting, unspecified: Secondary | ICD-10-CM | POA: Diagnosis not present

## 2023-02-24 DIAGNOSIS — R112 Nausea with vomiting, unspecified: Secondary | ICD-10-CM | POA: Diagnosis not present

## 2023-02-24 DIAGNOSIS — C240 Malignant neoplasm of extrahepatic bile duct: Secondary | ICD-10-CM | POA: Diagnosis not present

## 2023-02-24 DIAGNOSIS — K831 Obstruction of bile duct: Secondary | ICD-10-CM | POA: Diagnosis not present

## 2023-02-24 DIAGNOSIS — C7951 Secondary malignant neoplasm of bone: Secondary | ICD-10-CM | POA: Diagnosis not present

## 2023-02-24 DIAGNOSIS — F419 Anxiety disorder, unspecified: Secondary | ICD-10-CM | POA: Diagnosis present

## 2023-02-24 DIAGNOSIS — K920 Hematemesis: Secondary | ICD-10-CM | POA: Diagnosis present

## 2023-02-24 DIAGNOSIS — E871 Hypo-osmolality and hyponatremia: Secondary | ICD-10-CM | POA: Diagnosis not present

## 2023-02-24 DIAGNOSIS — I480 Paroxysmal atrial fibrillation: Secondary | ICD-10-CM | POA: Diagnosis not present

## 2023-02-24 DIAGNOSIS — I517 Cardiomegaly: Secondary | ICD-10-CM | POA: Diagnosis not present

## 2023-02-24 DIAGNOSIS — R4589 Other symptoms and signs involving emotional state: Secondary | ICD-10-CM | POA: Diagnosis not present

## 2023-02-24 DIAGNOSIS — I959 Hypotension, unspecified: Secondary | ICD-10-CM | POA: Diagnosis not present

## 2023-02-24 DIAGNOSIS — I482 Chronic atrial fibrillation, unspecified: Secondary | ICD-10-CM | POA: Diagnosis not present

## 2023-02-24 DIAGNOSIS — Z7901 Long term (current) use of anticoagulants: Secondary | ICD-10-CM | POA: Diagnosis not present

## 2023-02-24 DIAGNOSIS — R519 Headache, unspecified: Secondary | ICD-10-CM | POA: Diagnosis not present

## 2023-02-24 DIAGNOSIS — R933 Abnormal findings on diagnostic imaging of other parts of digestive tract: Secondary | ICD-10-CM | POA: Diagnosis not present

## 2023-02-24 DIAGNOSIS — Z8619 Personal history of other infectious and parasitic diseases: Secondary | ICD-10-CM | POA: Diagnosis not present

## 2023-02-24 DIAGNOSIS — K851 Biliary acute pancreatitis without necrosis or infection: Secondary | ICD-10-CM | POA: Diagnosis not present

## 2023-02-24 DIAGNOSIS — K5900 Constipation, unspecified: Secondary | ICD-10-CM | POA: Diagnosis not present

## 2023-02-24 DIAGNOSIS — I951 Orthostatic hypotension: Secondary | ICD-10-CM | POA: Diagnosis not present

## 2023-02-24 DIAGNOSIS — Z7401 Bed confinement status: Secondary | ICD-10-CM | POA: Diagnosis not present

## 2023-02-24 DIAGNOSIS — I4891 Unspecified atrial fibrillation: Secondary | ICD-10-CM | POA: Diagnosis not present

## 2023-02-24 DIAGNOSIS — L570 Actinic keratosis: Secondary | ICD-10-CM | POA: Diagnosis not present

## 2023-02-24 LAB — COMPREHENSIVE METABOLIC PANEL
ALT: 87 U/L — ABNORMAL HIGH (ref 0–44)
AST: 140 U/L — ABNORMAL HIGH (ref 15–41)
Albumin: 2.1 g/dL — ABNORMAL LOW (ref 3.5–5.0)
Alkaline Phosphatase: 231 U/L — ABNORMAL HIGH (ref 38–126)
Anion gap: 9 (ref 5–15)
BUN: 32 mg/dL — ABNORMAL HIGH (ref 8–23)
CO2: 22 mmol/L (ref 22–32)
Calcium: 8.4 mg/dL — ABNORMAL LOW (ref 8.9–10.3)
Chloride: 98 mmol/L (ref 98–111)
Creatinine, Ser: 1.27 mg/dL — ABNORMAL HIGH (ref 0.61–1.24)
GFR, Estimated: 57 mL/min — ABNORMAL LOW (ref >60)
Glucose, Bld: 90 mg/dL (ref 70–99)
Potassium: 4 mmol/L (ref 3.5–5.1)
Sodium: 129 mmol/L — ABNORMAL LOW (ref 135–145)
Total Bilirubin: 8.6 mg/dL — ABNORMAL HIGH (ref <1.2)
Total Protein: 5.3 g/dL — ABNORMAL LOW (ref 6.5–8.1)

## 2023-02-24 NOTE — TOC Transition Note (Signed)
Transition of Care [facility]) - Discharge Note   Patient Details  Name: Samuel Mayo MRN: [id number] Date of Birth: September 16, 1945  Transition [facility]) CM/SW Contact:  Carley Hammed, LCSW Phone Number: 02/24/2023, 11:51 AM   Clinical Narrative:    Pt to be transported to [facility] via [place]. Nurse to call report to [phone].   Final next level of care: Skilled Nursing Facility Barriers to Discharge: Barriers Resolved   Patient Goals and CMS Choice Patient states their goals for this hospitalization and ongoing recovery are:: [facility] CMS Medicare.gov Compare Post Acute Care list provided to:: Patient Choice offered to / list presented to : Patient [place] ownership interest in [facility].provided to:: Patient    Discharge Placement              Patient chooses bed at:  [facility]) Patient to be transferred to facility by: [facility] Name of family member notified: Nicholos Johns SO Patient and family notified of of transfer: 02/24/23  Discharge Plan and Services Additional resources added to the After Visit Summary for   In-house Referral: Clinical Social Work Discharge Planning Services: CM Consult Post Acute Care Choice: Skilled Nursing Facility                               Social Drivers of Health (SDOH) Interventions SDOH Screenings   Food Insecurity: No Food Insecurity (01/25/2023)  Housing: Patient Declined (01/25/2023)  Transportation Needs: No Transportation Needs (01/25/2023)  Utilities: Not At Risk (01/25/2023)  Alcohol Screen: Low Risk  (10/21/2022)  Depression (PHQ2-9): High Risk (01/13/2023)  Financial Resource Strain: Low Risk  (10/21/2022)  Physical Activity: Insufficiently Active (10/21/2022)  Social Connections: Moderately Integrated (10/21/2022)  Stress: Stress Concern Present (10/21/2022)  Tobacco Use: Low Risk  (01/31/2023)     Readmission Risk Interventions     No data to display

## 2023-02-24 NOTE — Plan of Care (Signed)
  Problem: Education: Goal: Knowledge of General Education information will improve Description: Including pain rating scale, medication(s)/side effects and non-pharmacologic comfort measures 02/24/2023 1358 by Loetta Rough, LPN Outcome: Adequate for Discharge 02/24/2023 1143 by Loetta Rough, LPN Outcome: Adequate for Discharge   Problem: Health Behavior/Discharge Planning: Goal: Ability to manage health-related needs will improve 02/24/2023 1358 by Loetta Rough, LPN Outcome: Adequate for Discharge 02/24/2023 1143 by Loetta Rough, LPN Outcome: Adequate for Discharge   Problem: Clinical Measurements: Goal: Ability to maintain clinical measurements within normal limits will improve 02/24/2023 1358 by Loetta Rough, LPN Outcome: Adequate for Discharge 02/24/2023 1143 by Loetta Rough, LPN Outcome: Adequate for Discharge Goal: Will remain free from infection 02/24/2023 1358 by Loetta Rough, LPN Outcome: Adequate for Discharge 02/24/2023 1143 by Loetta Rough, LPN Outcome: Adequate for Discharge Goal: Diagnostic test results will improve 02/24/2023 1358 by Loetta Rough, LPN Outcome: Adequate for Discharge 02/24/2023 1143 by Loetta Rough, LPN Outcome: Adequate for Discharge Goal: Respiratory complications will improve 02/24/2023 1358 by Loetta Rough, LPN Outcome: Adequate for Discharge 02/24/2023 1143 by Loetta Rough, LPN Outcome: Adequate for Discharge Goal: Cardiovascular complication will be avoided 02/24/2023 1358 by Loetta Rough, LPN Outcome: Adequate for Discharge 02/24/2023 1143 by Loetta Rough, LPN Outcome: Adequate for Discharge   Problem: Activity: Goal: Risk for activity intolerance will decrease 02/24/2023 1358 by Loetta Rough, LPN Outcome: Adequate for Discharge 02/24/2023 1143 by Loetta Rough, LPN Outcome: Adequate for Discharge   Problem: Nutrition: Goal: Adequate nutrition will be  maintained 02/24/2023 1358 by Loetta Rough, LPN Outcome: Adequate for Discharge 02/24/2023 1143 by Loetta Rough, LPN Outcome: Adequate for Discharge   Problem: Coping: Goal: Level of anxiety will decrease 02/24/2023 1358 by Loetta Rough, LPN Outcome: Adequate for Discharge 02/24/2023 1143 by Loetta Rough, LPN Outcome: Adequate for Discharge   Problem: Elimination: Goal: Will not experience complications related to bowel motility 02/24/2023 1358 by Loetta Rough, LPN Outcome: Adequate for Discharge 02/24/2023 1143 by Loetta Rough, LPN Outcome: Adequate for Discharge Goal: Will not experience complications related to urinary retention 02/24/2023 1358 by Loetta Rough, LPN Outcome: Adequate for Discharge 02/24/2023 1143 by Loetta Rough, LPN Outcome: Adequate for Discharge   Problem: Pain Management: Goal: General experience of comfort will improve 02/24/2023 1358 by Loetta Rough, LPN Outcome: Adequate for Discharge 02/24/2023 1143 by Loetta Rough, LPN Outcome: Adequate for Discharge   Problem: Safety: Goal: Ability to remain free from injury will improve 02/24/2023 1358 by Loetta Rough, LPN Outcome: Adequate for Discharge 02/24/2023 1143 by Loetta Rough, LPN Outcome: Adequate for Discharge   Problem: Skin Integrity: Goal: Risk for impaired skin integrity will decrease 02/24/2023 1358 by Loetta Rough, LPN Outcome: Adequate for Discharge 02/24/2023 1143 by Loetta Rough, LPN Outcome: Adequate for Discharge   Problem: Fluid Volume: Goal: Hemodynamic stability will improve 02/24/2023 1358 by Loetta Rough, LPN Outcome: Adequate for Discharge 02/24/2023 1143 by Loetta Rough, LPN Outcome: Adequate for Discharge   Problem: Clinical Measurements: Goal: Diagnostic test results will improve 02/24/2023 1358 by Loetta Rough, LPN Outcome: Adequate for Discharge 02/24/2023 1143 by Loetta Rough, LPN Outcome:  Adequate for Discharge Goal: Signs and symptoms of infection will decrease 02/24/2023 1358 by Loetta Rough, LPN Outcome: Adequate for Discharge 02/24/2023 1143 by Loetta Rough, LPN Outcome: Adequate for Discharge   Problem: Respiratory: Goal: Ability to maintain adequate ventilation will improve 02/24/2023 1358 by Loetta Rough, LPN Outcome: Adequate for Discharge 02/24/2023 1143 by Loetta Rough, LPN Outcome: Adequate for Discharge

## 2023-02-24 NOTE — Consult Note (Signed)
[facility] Liaison Consult Note    02/24/2023  Samuel Mayo [facility] 14-Oct-1945 [id number]  Follow up:  LLOS 31 days  Patient for skilled nursing facility at [facility]. Currently, this SNF is not a [facility] affiliated facility,  No follow up with [facility] Community RN PAC at that facility.  Patient needs are to be met at a skilled nursing facility level of care.  Charlesetta Shanks, RN, BSN, CCM [employer], [facility] Liaison Direct Dial: [phone] or secure chat Email: [email or web address]

## 2023-02-24 NOTE — Progress Notes (Signed)
Attempted to call report to [facility] at [phone] per Lurena Joiner the head nurse will give me a call back at my direct number [phone].

## 2023-02-24 NOTE — Discharge Summary (Signed)
Physician Discharge Summary  Samuel Mayo [name]:[id number] DOB: 1945-06-10 DOA: 01/24/2023  PCP: [name], [name], MD  Admit date: 01/24/2023 Discharge date: 02/24/2023  Time spent: 46 minutes  Recommendations for Outpatient Follow-up:  Requires Chem-12 INR CBC in 3 to 4 days Requires close coordination and follow-up with oncologist in the outpatient setting for PET scan-CC Dr. Mosetta Putt Please force fluids and note medication changes--- if blood pressure improves can resume antihypertensives Recommend close follow-up with interventional radiology for management of percutaneous cholecystotomy and that needs to be flushed daily as per orders  Discharge Diagnoses:  MAIN problem for hospitalization   New diagnosis of Klatskin tumor with metastases AKI during hospital stay Previous cholecystotomy  Please see below for itemized issues addressed in HOpsital- refer to other progress notes for clarity if needed  Discharge Condition: gaurded  Diet recommendation:  regular  Filed Weights   02/05/23 0223 02/11/23 1055 02/21/23 0420  Weight: 82.5 kg 83.5 kg 81.4 kg    History of present illness:  77 year old male Known mitral regurgitation status post annuloplasty, atrial flutter fib on Eliquis CHADVASC >4 HTN Seizures Diagnosed with acute cholecystitis 12/30/2022 had a RUQ drain placed on 10/21-was sent home on Augmentin and discharged 11/1 He followed up with general surgeon and he also had an endoscopy secondary to persistently elevated LFT and was --- he was evaluated by IR on 11/22 for reevaluation of gallbladder issues-and during imaging he was noted to have persistent occlusion of cystic duct without opacification 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 and patient was told to [facility] to the emergency room 11/20 3 GI consulted 11/27 ERCP unsuccessful unable to enter bile duct 11/29 IR placed biliary drain concern for Klatskin's tumor 11/28 nephrology consulted for hyponatremia workup consistent  with SIADH 11/30 seen by general surgery recommended to go to [facility] hepatobiliary service 12/1 hypotension-palliative care consulted 12/6 left-sided drain placed by IR growing Pseudomonas blood culture  12/9 oncology consulted secondary to CA [id number]- 12/12 upsize of drain right-sided biliary LFTs    Hospital Course:  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 Mosetta Putt to follow may need second opinion at [facility] Pain control tramadol every 6 as needed moderate pain-he has been working some with therapy although is limited in mobility far from his baseline and will need skilled care   Sepsis on admission secondary to cholangitis with Pseudomonas bacteremia Recent acute cholecystitis with cholecystotomy and drain upsize 11/22 to help with drainage Will need to have the cholecystotomy drain flushed daily with 5-10 cc to ensure it continues to drain Completed Levaquin Flagyl 12/18 Blood culture X2---12/12 was negative and cleared positive Pseudomonas blood cultures from 12/6   AKI Had volume depletion-intermittently on IV fluids-needs to force fluids in the outpatient setting-we have encouraged him to force fluids--he had azotemia that is intermittent and his appetite is poor Would monitor trends in the outpatient setting-high risk for poor hydration malnutrition etc. and need to at least get 1.5 L negative fluid to keep volumes even   Paroxysmal A-fib CHADVASC >4 mitral valve regurg Orthostatic hypotension-now on midodrine 10 3 times daily Hypotension precludes GDMT and patient has been held off of metoprolol XL 25 and amlodipine 5 Continues eliquis 5 bid   Previous seizure disorder Continues on Depakote 750 twice daily--Rx given at time of D/c   Discharge Exam: Vitals:   02/24/23 0423 02/24/23 0849  BP: 103/78 (!) 119/90  Pulse: 90   Resp:  16  Temp:  97.8 F (36.6 C)  SpO2: 100% 100%    Subj on day of d/c   Seen at the bedsdie  with wife seems a little despondent.  Hasn't been oob thisam No pain Drinking some  General Exam on discharge  EOMI NCAT no focal deficit --- anicteric++ Chest clear S1-S2 no murmur Abdomen soft nontender nondistended multiple drains in place ROM intact Neuro power 5/5 no asterixis   Discharge Instructions   Discharge Instructions     Diet - low sodium heart healthy   Complete by: As directed    Discharge wound care:   Complete by: As directed    Pressure Injury 02/19/23 Sacrum Medial Stage 2 -  Partial thickness loss of dermis presenting as a shallow open injury with a red, pink wound bed without slough.   Increase activity slowly   Complete by: As directed       Allergies as of 02/24/2023       Reactions   Other Other (See Comments)   Childhood reaction to Neosporin Ophthalmic ointment caused severe eye redness, irritation        Medication List     STOP taking these medications    amLODipine 5 MG tablet Commonly known as: NORVASC   amoxicillin-clavulanate 875-125 MG tablet Commonly known as: AUGMENTIN   gabapentin 300 MG capsule Commonly known as: NEURONTIN   ibandronate 150 MG tablet Commonly known as: BONIVA   metoprolol succinate 25 MG 24 hr tablet Commonly known as: TOPROL-XL   OVER THE COUNTER MEDICATION       TAKE these medications    diclofenac Sodium 1 % Gel Commonly known as: VOLTAREN Apply 2 g topically 4 (four) times daily as needed (joint pain).   divalproex 250 MG DR tablet Commonly known as: DEPAKOTE Take 3 tablets (750 mg total) by mouth 2 (two) times daily. Take in addition to 500 mg twice a day (pt should have a total of 750 mg twice a day) What changed:  how much to take Another medication with the same name was removed. Continue taking this medication, and follow the directions you see here.   Eliquis 5 MG Tabs tablet Generic drug: apixaban TAKE 1 TABLET BY MOUTH TWICE A DAY   LUTEIN PO Take 1 tablet by mouth every  evening.   midodrine 10 MG tablet Commonly known as: PROAMATINE Take 1 tablet (10 mg total) by mouth 3 (three) times daily with meals.   multivitamin with minerals Tabs tablet Take 1 tablet by mouth every evening.   polyethylene glycol 17 g packet Commonly known as: MIRALAX / GLYCOLAX Take 17 g by mouth daily as needed for mild constipation.   sodium chloride flush 0.9 % Soln injection 10 mLs by Intracatheter route as needed. Flush percutaneous cholecystostomy drain with 10 ml normal saline once daily.   traMADol 50 MG tablet Commonly known as: ULTRAM Take 1 tablet (50 mg total) by mouth every 6 (six) hours as needed for moderate pain (pain score 4-6).   VITAMIN D-3 PO Take 1 capsule by mouth every evening.               Discharge Care Instructions  (From admission, onward)           Start     Ordered   02/21/23 0000  Discharge wound care:       Comments: Pressure Injury 02/19/23 Sacrum Medial Stage 2 -  Partial thickness loss of dermis presenting as a shallow open injury with a red, pink wound bed without slough.  02/21/23 0856           Allergies  Allergen Reactions   Other Other (See Comments)    Childhood reaction to Neosporin Ophthalmic ointment caused severe eye redness, irritation    Follow-up Information     Samuel Bender, MD Follow up.   Specialty: Surgical Oncology Why: A referral has been placed to Surgical Oncology, Dr. Stephanie Coup, at [facility] in [place].  His office should reach out to you.  If you fail to hear from them, please call their office number [phone]. Contact information: [address] [place] [address] [phone]         [facility] Follow up.   Why: IR scheduler will call you for 6-8 week drain exchange. Please call [phone]. Contact information: [address] [place] [address] [phone]                 The results of significant diagnostics  from this hospitalization (including imaging, microbiology, ancillary and laboratory) are listed below for reference.    Significant Diagnostic Studies: IR EXCHANGE BILIARY DRAIN Result Date: 02/14/2023 INDICATION: Patient initially diagnosed with Mirizzi syndrome, post cholecystostomy tube placement on 01/02/2023. Unfortunately, patient experienced worsening hyperbilirubinemia, ultimately undergoing attempted though unsuccessful ERCP. As such, patient underwent placement of a right-sided biliary drainage catheter on the 01/31/2023 and ultimately placement of a left-sided biliary drainage catheter on 02/07/2023. 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 Since that time, despite cholecystostomy and bilateral biliary drainage catheter placement, the patient's hyperbilirubinemia has persisted. As such, the patient presents today for cholangiogram and potential biliary drainage catheter exchange and up sizing. EXAM: 1. Cholangiogram via existing cholecystostomy tube. CHOLANGIOGRAM VIA EXISTING CHOLECYSTOSTOMY TUBE 2. FLUOROSCOPIC GUIDED EXCHANGE AND UP SIZING TO NOW 14 FRENCH RIGHT-SIDED BILIARY DRAINAGE CATHETER 3. FLUOROSCOPIC GUIDED EXCHANGE AND UP SIZING TO NOW 12 FRENCH LEFT-SIDED BILIARY DRAINAGE CATHETER COMPARISON:  COMPARISON Image guided left-sided biliary drainage catheter placement and brush biopsy - 02/07/2023 Image guided right-sided biliary drainage catheter placement - 01/31/2023 Cholecystostomy tube exchange - 01/24/2023 Image guided cholecystostomy tube placement - 01/02/2023 CT abdomen and pelvis - 02/06/2023; 02/02/2023 Abdominal MRI - 12/29/2022 CONTRAST:  25 mL Omnipaque-300 administered into the collecting system ANESTHESIA/SEDATION: ANESTHESIA/SEDATION 3 minutes, 6 seconds (117 mGy) FLUOROSCOPY TIME:  3 minutes, 6 seconds (1 image 17 mGy) COMPLICATIONS: None immediate. TECHNIQUE: Informed written consent was obtained from the patient after a discussion  of the risks, benefits and alternatives to treatment. Questions regarding the procedure were encouraged and answered. A timeout was performed prior to the initiation of the procedure. Preprocedural spot fluoroscopic image was obtained of the right upper abdominal quadrant. Cholangiogram were performed via the right and left biliary drainage catheters as well as the cholecystostomy tube. Next, the external portion of the left lower drainage catheter was cut and cannulated with an Amplatz wire which was advanced through the biliary drainage catheter to the level of the duodenum. Under fluoroscopic guided exchange, the pre-existing 10 [place] left-sided biliary drainage catheter was exchanged for a new, slightly larger, now 1 [place] biliary drainage catheter with end coiled and locked within the duodenum. The identical procedure was performed for the right-sided biliary drainage catheter, ultimately with exchange of the pre-existing 12 [place] biliary drainage catheter for a new, slightly larger now 77 [place] biliary drainage catheter with end coiled and locked within the duodenum. Small amount of contrast was injected via the exchanged biliary drainage catheters and post fluoroscopic and radiographic  images were obtained. The biliary drainage catheters were and secured to the skin with interrupted sutures and all drainage catheters were reconnected to gravity bags. Dressings were applied. The patient tolerated the procedure well without immediate postprocedural complication. FINDINGS: Appropriately positioned and functioning cholecystostomy tube. There is persistent occlusion of the cystic duct at its mid/peripheral aspects. Appropriate positioning and functionality of both the right and left-sided biliary drainage catheters with opacification of the intrahepatic biliary system as well as passage of contrast through the catheters to the level of the duodenum. After fluoroscopic guided exchange and up sizing, both  biliary drainage catheters are appropriately positioned with end coiled and locked within the duodenum. IMPRESSION: 1. Successful fluoroscopic guided exchange and up sizing of now 7 French right-sided biliary drainage catheter with end coiled and locked within the duodenum. 2. Successful fluoroscopic guided exchange and up sizing of now 64 French left-sided biliary drainage catheter with end coiled and locked within the duodenum. 3. Appropriately positioned and functioning cholecystostomy tube with persistent complete occlusion of the mid/peripheral aspect of the cystic duct. The cholecystostomy tube was not exchanged as it was most recently exchanged on 01/24/2023. PLAN: Recommend obtaining daily CMP. If patient's hyperbilirubinemia immediate does not improve in the coming days, would recommend further evaluation with repeat either contrast-enhanced abdominal CT or MRCP as indicated. Electronically Signed   By: Samuel Mayo M.D.   On: 02/14/2023 07:15   IR EXCHANGE BILIARY DRAIN Result Date: 02/14/2023 INDICATION: Patient initially diagnosed with Mirizzi syndrome, post cholecystostomy tube placement on 01/02/2023. Unfortunately, patient experienced worsening hyperbilirubinemia, ultimately undergoing attempted though unsuccessful ERCP. As such, patient underwent placement of a right-sided biliary drainage catheter on the 01/31/2023 and ultimately placement of a left-sided biliary drainage catheter on 02/07/2023. 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 Since that time, despite cholecystostomy and bilateral biliary drainage catheter placement, the patient's hyperbilirubinemia has persisted. As such, the patient presents today for cholangiogram and potential biliary drainage catheter exchange and up sizing. EXAM: 1. Cholangiogram via existing cholecystostomy tube. CHOLANGIOGRAM VIA EXISTING CHOLECYSTOSTOMY TUBE 2. FLUOROSCOPIC GUIDED EXCHANGE AND UP SIZING TO NOW 14 FRENCH  RIGHT-SIDED BILIARY DRAINAGE CATHETER 3. FLUOROSCOPIC GUIDED EXCHANGE AND UP SIZING TO NOW 12 FRENCH LEFT-SIDED BILIARY DRAINAGE CATHETER COMPARISON:  COMPARISON Image guided left-sided biliary drainage catheter placement and brush biopsy - 02/07/2023 Image guided right-sided biliary drainage catheter placement - 01/31/2023 Cholecystostomy tube exchange - 01/24/2023 Image guided cholecystostomy tube placement - 01/02/2023 CT abdomen and pelvis - 02/06/2023; 02/02/2023 Abdominal MRI - 12/29/2022 CONTRAST:  25 mL Omnipaque-300 administered into the collecting system ANESTHESIA/SEDATION: ANESTHESIA/SEDATION 3 minutes, 6 seconds (117 mGy) FLUOROSCOPY TIME:  3 minutes, 6 seconds (1 image 17 mGy) COMPLICATIONS: None immediate. TECHNIQUE: Informed written consent was obtained from the patient after a discussion of the risks, benefits and alternatives to treatment. Questions regarding the procedure were encouraged and answered. A timeout was performed prior to the initiation of the procedure. Preprocedural spot fluoroscopic image was obtained of the right upper abdominal quadrant. Cholangiogram were performed via the right and left biliary drainage catheters as well as the cholecystostomy tube. Next, the external portion of the left lower drainage catheter was cut and cannulated with an Amplatz wire which was advanced through the biliary drainage catheter to the level of the duodenum. Under fluoroscopic guided exchange, the pre-existing 10 [place] left-sided biliary drainage catheter was exchanged for a new, slightly larger, now 42 [place] biliary drainage catheter with end coiled and locked within the duodenum. The identical  procedure was performed for the right-sided biliary drainage catheter, ultimately with exchange of the pre-existing 12 [place] biliary drainage catheter for a new, slightly larger now 86 [place] biliary drainage catheter with end coiled and locked within the duodenum. Small amount of contrast was injected  via the exchanged biliary drainage catheters and post fluoroscopic and radiographic images were obtained. The biliary drainage catheters were and secured to the skin with interrupted sutures and all drainage catheters were reconnected to gravity bags. Dressings were applied. The patient tolerated the procedure well without immediate postprocedural complication. FINDINGS: Appropriately positioned and functioning cholecystostomy tube. There is persistent occlusion of the cystic duct at its mid/peripheral aspects. Appropriate positioning and functionality of both the right and left-sided biliary drainage catheters with opacification of the intrahepatic biliary system as well as passage of contrast through the catheters to the level of the duodenum. After fluoroscopic guided exchange and up sizing, both biliary drainage catheters are appropriately positioned with end coiled and locked within the duodenum. IMPRESSION: 1. Successful fluoroscopic guided exchange and up sizing of now 56 French right-sided biliary drainage catheter with end coiled and locked within the duodenum. 2. Successful fluoroscopic guided exchange and up sizing of now 71 French left-sided biliary drainage catheter with end coiled and locked within the duodenum. 3. Appropriately positioned and functioning cholecystostomy tube with persistent complete occlusion of the mid/peripheral aspect of the cystic duct. The cholecystostomy tube was not exchanged as it was most recently exchanged on 01/24/2023. PLAN: Recommend obtaining daily CMP. If patient's hyperbilirubinemia immediate does not improve in the coming days, would recommend further evaluation with repeat either contrast-enhanced abdominal CT or MRCP as indicated. Electronically Signed   By: Samuel Mayo M.D.   On: 02/14/2023 07:15   IR CHOLANGIOGRAM EXISTING TUBE Result Date: 02/14/2023 INDICATION: Patient initially diagnosed with Mirizzi syndrome, post cholecystostomy tube placement on  01/02/2023. Unfortunately, patient experienced worsening hyperbilirubinemia, ultimately undergoing attempted though unsuccessful ERCP. As such, patient underwent placement of a right-sided biliary drainage catheter on the 01/31/2023 and ultimately placement of a left-sided biliary drainage catheter on 02/07/2023. 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 Since that time, despite cholecystostomy and bilateral biliary drainage catheter placement, the patient's hyperbilirubinemia has persisted. As such, the patient presents today for cholangiogram and potential biliary drainage catheter exchange and up sizing. EXAM: 1. Cholangiogram via existing cholecystostomy tube. CHOLANGIOGRAM VIA EXISTING CHOLECYSTOSTOMY TUBE 2. FLUOROSCOPIC GUIDED EXCHANGE AND UP SIZING TO NOW 14 FRENCH RIGHT-SIDED BILIARY DRAINAGE CATHETER 3. FLUOROSCOPIC GUIDED EXCHANGE AND UP SIZING TO NOW 12 FRENCH LEFT-SIDED BILIARY DRAINAGE CATHETER COMPARISON:  COMPARISON Image guided left-sided biliary drainage catheter placement and brush biopsy - 02/07/2023 Image guided right-sided biliary drainage catheter placement - 01/31/2023 Cholecystostomy tube exchange - 01/24/2023 Image guided cholecystostomy tube placement - 01/02/2023 CT abdomen and pelvis - 02/06/2023; 02/02/2023 Abdominal MRI - 12/29/2022 CONTRAST:  25 mL Omnipaque-300 administered into the collecting system ANESTHESIA/SEDATION: ANESTHESIA/SEDATION 3 minutes, 6 seconds (117 mGy) FLUOROSCOPY TIME:  3 minutes, 6 seconds (1 image 17 mGy) COMPLICATIONS: None immediate. TECHNIQUE: Informed written consent was obtained from the patient after a discussion of the risks, benefits and alternatives to treatment. Questions regarding the procedure were encouraged and answered. A timeout was performed prior to the initiation of the procedure. Preprocedural spot fluoroscopic image was obtained of the right upper abdominal quadrant. Cholangiogram were performed via the  right and left biliary drainage catheters as well as the cholecystostomy tube. Next, the external portion of the left lower drainage catheter  was cut and cannulated with an Amplatz wire which was advanced through the biliary drainage catheter to the level of the duodenum. Under fluoroscopic guided exchange, the pre-existing 10 [place] left-sided biliary drainage catheter was exchanged for a new, slightly larger, now 9 [place] biliary drainage catheter with end coiled and locked within the duodenum. The identical procedure was performed for the right-sided biliary drainage catheter, ultimately with exchange of the pre-existing 12 [place] biliary drainage catheter for a new, slightly larger now 84 [place] biliary drainage catheter with end coiled and locked within the duodenum. Small amount of contrast was injected via the exchanged biliary drainage catheters and post fluoroscopic and radiographic images were obtained. The biliary drainage catheters were and secured to the skin with interrupted sutures and all drainage catheters were reconnected to gravity bags. Dressings were applied. The patient tolerated the procedure well without immediate postprocedural complication. FINDINGS: Appropriately positioned and functioning cholecystostomy tube. There is persistent occlusion of the cystic duct at its mid/peripheral aspects. Appropriate positioning and functionality of both the right and left-sided biliary drainage catheters with opacification of the intrahepatic biliary system as well as passage of contrast through the catheters to the level of the duodenum. After fluoroscopic guided exchange and up sizing, both biliary drainage catheters are appropriately positioned with end coiled and locked within the duodenum. IMPRESSION: 1. Successful fluoroscopic guided exchange and up sizing of now 73 French right-sided biliary drainage catheter with end coiled and locked within the duodenum. 2. Successful fluoroscopic guided  exchange and up sizing of now 69 French left-sided biliary drainage catheter with end coiled and locked within the duodenum. 3. Appropriately positioned and functioning cholecystostomy tube with persistent complete occlusion of the mid/peripheral aspect of the cystic duct. The cholecystostomy tube was not exchanged as it was most recently exchanged on 01/24/2023. PLAN: Recommend obtaining daily CMP. If patient's hyperbilirubinemia immediate does not improve in the coming days, would recommend further evaluation with repeat either contrast-enhanced abdominal CT or MRCP as indicated. Electronically Signed   By: Samuel Mayo M.D.   On: 02/14/2023 07:15   CT CHEST WO CONTRAST Result Date: 02/13/2023 CLINICAL DATA:  Hepatocellular carcinoma, cholangiocarcinoma, staging evaluation EXAM: CT CHEST WITHOUT CONTRAST TECHNIQUE: Multidetector CT imaging of the chest was performed following the standard protocol without IV contrast. RADIATION DOSE REDUCTION: This exam was performed according to the departmental dose-optimization program which includes automated exposure control, adjustment of the mA and/or kV according to patient size and/or use of iterative reconstruction technique. COMPARISON:  02/06/2023 FINDINGS: Cardiovascular: Unenhanced imaging of the heart is unremarkable without pericardial effusion. Mitral valve prosthesis. 4.1 cm ascending thoracic aortic aneurysm. Evaluation of the vascular lumen is limited without IV contrast. Atherosclerosis of the aorta and coronary vasculature. Mediastinum/Nodes: No enlarged mediastinal or axillary lymph nodes. Thyroid gland, trachea, and esophagus demonstrate no significant findings. Lungs/Pleura: No acute airspace disease, effusion, or pneumothorax. Linear consolidation within the right lower lobe consistent with subsegmental atelectasis or scarring. Upper Abdomen: Percutaneous biliary drains are identified traversing the left and right lobes of the liver. Downstream extent  excluded by slice selection. Percutaneous cholecystostomy tube is seen coiled within the gallbladder fossa. Minimal pneumobilia. Continued edema at the level of porta hepatis which may reflect known cholangiocarcinoma. Musculoskeletal: Large sclerotic lesions are seen within the C7 and T3 vertebral body suspicious for metastatic disease. There are chronic compression deformities of T4, T9, T11, and L1. Reconstructed images demonstrate no additional findings. IMPRESSION: 1. Large sclerotic lesions within the C7 and T3 vertebral bodies consistent  with bony metastatic disease. 2. No other signs of intrathoracic metastases. 3. Stable findings within the liver consistent with known cholangiocarcinoma and indwelling biliary and gallbladder drains. Please refer to recent CT abdomen exam for full description of findings. 4. 4.1 cm ascending thoracic aortic aneurysm. Recommend annual imaging followup by CTA or MRA. This recommendation follows 2010 ACCF/AHA/AATS/ACR/ASA/SCA/SCAI/SIR/STS/SVM Guidelines for the Diagnosis and Management of Patients with Thoracic Aortic Disease. Circulation. 2010; 121: C623-J628. Aortic aneurysm NOS (ICD10-I71.9) 5. Aortic Atherosclerosis (ICD10-I70.0). Coronary artery atherosclerosis. Electronically Signed   By: Sharlet Salina M.D.   On: 02/13/2023 18:57   IR INT EXT BILIARY DRAIN WITH CHOLANGIOGRAM Result Date: 02/07/2023 INDICATION: 77 year old gentleman with history of biliary obstruction underwent percutaneous cholecystostomy placement on 01/02/2023 and replacement on 01/24/2023. Continued symptoms of jaundice and hyperbilirubinemia prompteda MRI on 01/24/2023. Enhancement of the ductal system was suspicious for cholangitis. Right hepatic biliary drain was placed on 01/31/2023. 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 Despite placement of the right hepatic drain, hyperbilirubinemia and jaundice  has persisted. He returns today for placement of left hepatic biliary drain as well as brush biopsy of the stenosed segment of the right hepatic duct through a pre-existing access. EXAM: 1. Ultrasound and fluoroscopy guided left hepatic internal external biliary drain placement 2. Fluoroscopy guided exchange of existing right hepatic internal external biliary drain 3. Brush biopsy of common and right hepatic bile ducts 4. Bilateral percutaneous transhepatic cholangiogram MEDICATIONS: Rocephin 2 g IV; The antibiotic was administered within an appropriate time frame prior to the initiation of the procedure. Demerol IV 50 mg ANESTHESIA/SEDATION: Moderate (conscious) sedation was employed during this procedure. A total of Versed 3 mg and Fentanyl 75 mcg was administered intravenously by the radiology nurse. Total intra-service moderate Sedation Time: 56 minutes. The patient's level of consciousness and vital signs were monitored continuously by radiology nursing throughout the procedure under my direct supervision. FLUOROSCOPY: Radiation Exposure Index (as provided by the fluoroscopic device): 98 mGy Kerma COMPLICATIONS: None immediate. PROCEDURE: Informed written consent was obtained from the patient after a thorough discussion of the procedural risks, benefits and alternatives. All questions were addressed. Maximal Sterile Barrier Technique was utilized including caps, mask, sterile gowns, sterile gloves, sterile drape, hand hygiene and skin antiseptic. A timeout was performed prior to the initiation of the procedure. Patient positioned supine on the procedure table. Epigastric and right upper quadrant abdominal wall skin prepped and draped in the usual sterile fashion. Sonographic evaluation of the left hepatic lobe showed mildly dilated ducts. Following local lidocaine administration, a left hepatic lobe duct was successfully accessed with a 21 gauge needle. 21 gauge needle was removed over 0.018 inch guidewire. The  guidewire was advanced to the level of the common bile duct. Contrast administered through the right hepatic drain confirmed appropriate positioning within the right hepatic bile ducts and doudenum. The right-sided 86 French drain was cut and removed over 0.035 inch guidewire and replaced with an 8 [place] sheath. The 8 French sheath was advanced beyond the site of right hepatic and common bile duct stenosis. Two brush biopsies were performed at the stenotic segments of the common and right main hepatic ducts. Both samples were sent to pathology in sterile saline. New 12 French internal external biliary drain was inserted over the 0.035 inch guidewire. The pigtail was formed within the doudenum. Attention was again turned to the left hepatic lobe. Transitional dilator set was inserted over the  0.018 inch guidewire. Kumpe catheter and stiff glidewire were advanced to the level of the doudenum utilizing fluoroscopic guidance through this access. Kumpe the catheter was removed and a 10 [place] internal external biliary drain was inserted. The distal pigtail was formed within the doudenum. Contrast administered through both drains confirmed appropriate positioning within the doudenum as well as within the intrahepatic bile ducts. Both drains were secured to skin with silk suture and connected to bag. IMPRESSION: 1. Successful brush biopsy of the stenotic segments of the common bile and right main bile ducts. 2. Successful replacement of 12 French right hepatic internal external biliary drain. 3. Successful insertion of 10 French left hepatic internal external biliary drain. Electronically Signed   By: Acquanetta Belling M.D.   On: 02/07/2023 16:36   IR ENDOLUMINAL BX OF BILIARY TREE Result Date: 02/07/2023 INDICATION: 77 year old gentleman with history of biliary obstruction underwent percutaneous cholecystostomy placement on 01/02/2023 and replacement on 01/24/2023. Continued symptoms of jaundice and hyperbilirubinemia  prompteda MRI on 01/24/2023. Enhancement of the ductal system was suspicious for cholangitis. Right hepatic biliary drain was placed on 01/31/2023. 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 Despite placement of the right hepatic drain, hyperbilirubinemia and jaundice has persisted. He returns today for placement of left hepatic biliary drain as well as brush biopsy of the stenosed segment of the right hepatic duct through a pre-existing access. EXAM: 1. Ultrasound and fluoroscopy guided left hepatic internal external biliary drain placement 2. Fluoroscopy guided exchange of existing right hepatic internal external biliary drain 3. Brush biopsy of common and right hepatic bile ducts 4. Bilateral percutaneous transhepatic cholangiogram MEDICATIONS: Rocephin 2 g IV; The antibiotic was administered within an appropriate time frame prior to the initiation of the procedure. Demerol IV 50 mg ANESTHESIA/SEDATION: Moderate (conscious) sedation was employed during this procedure. A total of Versed 3 mg and Fentanyl 75 mcg was administered intravenously by the radiology nurse. Total intra-service moderate Sedation Time: 56 minutes. The patient's level of consciousness and vital signs were monitored continuously by radiology nursing throughout the procedure under my direct supervision. FLUOROSCOPY: Radiation Exposure Index (as provided by the fluoroscopic device): 98 mGy Kerma COMPLICATIONS: None immediate. PROCEDURE: Informed written consent was obtained from the patient after a thorough discussion of the procedural risks, benefits and alternatives. All questions were addressed. Maximal Sterile Barrier Technique was utilized including caps, mask, sterile gowns, sterile gloves, sterile drape, hand hygiene and skin antiseptic. A timeout was performed prior to the initiation of the procedure. Patient positioned supine on the procedure  table. Epigastric and right upper quadrant abdominal wall skin prepped and draped in the usual sterile fashion. Sonographic evaluation of the left hepatic lobe showed mildly dilated ducts. Following local lidocaine administration, a left hepatic lobe duct was successfully accessed with a 21 gauge needle. 21 gauge needle was removed over 0.018 inch guidewire. The guidewire was advanced to the level of the common bile duct. Contrast administered through the right hepatic drain confirmed appropriate positioning within the right hepatic bile ducts and doudenum. The right-sided 76 French drain was cut and removed over 0.035 inch guidewire and replaced with an 8 [place] sheath. The 8 French sheath was advanced beyond the site of right hepatic and common bile duct stenosis. Two brush biopsies were performed at the stenotic segments of the common and right main hepatic ducts. Both samples were sent to pathology in sterile saline. New 12 [place] internal external  biliary drain was inserted over the 0.035 inch guidewire. The pigtail was formed within the doudenum. Attention was again turned to the left hepatic lobe. Transitional dilator set was inserted over the 0.018 inch guidewire. Kumpe catheter and stiff glidewire were advanced to the level of the doudenum utilizing fluoroscopic guidance through this access. Kumpe the catheter was removed and a 10 [place] internal external biliary drain was inserted. The distal pigtail was formed within the doudenum. Contrast administered through both drains confirmed appropriate positioning within the doudenum as well as within the intrahepatic bile ducts. Both drains were secured to skin with silk suture and connected to bag. IMPRESSION: 1. Successful brush biopsy of the stenotic segments of the common bile and right main bile ducts. 2. Successful replacement of 12 French right hepatic internal external biliary drain. 3. Successful insertion of 10 French left hepatic internal external  biliary drain. Electronically Signed   By: Acquanetta Belling M.D.   On: 02/07/2023 16:36   IR ENDOLUMINAL BX OF BILIARY TREE Result Date: 02/07/2023 INDICATION: 77 year old gentleman with history of biliary obstruction underwent percutaneous cholecystostomy placement on 01/02/2023 and replacement on 01/24/2023. Continued symptoms of jaundice and hyperbilirubinemia prompteda MRI on 01/24/2023. Enhancement of the ductal system was suspicious for cholangitis. Right hepatic biliary drain was placed on 01/31/2023. 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 Despite placement of the right hepatic drain, hyperbilirubinemia and jaundice has persisted. He returns today for placement of left hepatic biliary drain as well as brush biopsy of the stenosed segment of the right hepatic duct through a pre-existing access. EXAM: 1. Ultrasound and fluoroscopy guided left hepatic internal external biliary drain placement 2. Fluoroscopy guided exchange of existing right hepatic internal external biliary drain 3. Brush biopsy of common and right hepatic bile ducts 4. Bilateral percutaneous transhepatic cholangiogram MEDICATIONS: Rocephin 2 g IV; The antibiotic was administered within an appropriate time frame prior to the initiation of the procedure. Demerol IV 50 mg ANESTHESIA/SEDATION: Moderate (conscious) sedation was employed during this procedure. A total of Versed 3 mg and Fentanyl 75 mcg was administered intravenously by the radiology nurse. Total intra-service moderate Sedation Time: 56 minutes. The patient's level of consciousness and vital signs were monitored continuously by radiology nursing throughout the procedure under my direct supervision. FLUOROSCOPY: Radiation Exposure Index (as provided by the fluoroscopic device): 98 mGy Kerma COMPLICATIONS: None immediate. PROCEDURE: Informed written consent was obtained from the patient after  a thorough discussion of the procedural risks, benefits and alternatives. All questions were addressed. Maximal Sterile Barrier Technique was utilized including caps, mask, sterile gowns, sterile gloves, sterile drape, hand hygiene and skin antiseptic. A timeout was performed prior to the initiation of the procedure. Patient positioned supine on the procedure table. Epigastric and right upper quadrant abdominal wall skin prepped and draped in the usual sterile fashion. Sonographic evaluation of the left hepatic lobe showed mildly dilated ducts. Following local lidocaine administration, a left hepatic lobe duct was successfully accessed with a 21 gauge needle. 21 gauge needle was removed over 0.018 inch guidewire. The guidewire was advanced to the level of the common bile duct. Contrast administered through the right hepatic drain confirmed appropriate positioning within the right hepatic bile ducts and doudenum. The right-sided 41 French drain was cut and removed over 0.035 inch guidewire and replaced with an 8 [place] sheath. The 8 French sheath was advanced beyond the site of right hepatic and common bile duct  stenosis. Two brush biopsies were performed at the stenotic segments of the common and right main hepatic ducts. Both samples were sent to pathology in sterile saline. New 12 French internal external biliary drain was inserted over the 0.035 inch guidewire. The pigtail was formed within the doudenum. Attention was again turned to the left hepatic lobe. Transitional dilator set was inserted over the 0.018 inch guidewire. Kumpe catheter and stiff glidewire were advanced to the level of the doudenum utilizing fluoroscopic guidance through this access. Kumpe the catheter was removed and a 10 [place] internal external biliary drain was inserted. The distal pigtail was formed within the doudenum. Contrast administered through both drains confirmed appropriate positioning within the doudenum as well as within the  intrahepatic bile ducts. Both drains were secured to skin with silk suture and connected to bag. IMPRESSION: 1. Successful brush biopsy of the stenotic segments of the common bile and right main bile ducts. 2. Successful replacement of 12 French right hepatic internal external biliary drain. 3. Successful insertion of 10 French left hepatic internal external biliary drain. Electronically Signed   By: Acquanetta Belling M.D.   On: 02/07/2023 16:36   IR EXCHANGE BILIARY DRAIN Result Date: 02/07/2023 INDICATION: 77 year old gentleman with history of biliary obstruction underwent percutaneous cholecystostomy placement on 01/02/2023 and replacement on 01/24/2023. Continued symptoms of jaundice and hyperbilirubinemia prompteda MRI on 01/24/2023. Enhancement of the ductal system was suspicious for cholangitis. Right hepatic biliary drain was placed on 01/31/2023. 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 Despite placement of the right hepatic drain, hyperbilirubinemia and jaundice has persisted. He returns today for placement of left hepatic biliary drain as well as brush biopsy of the stenosed segment of the right hepatic duct through a pre-existing access. EXAM: 1. Ultrasound and fluoroscopy guided left hepatic internal external biliary drain placement 2. Fluoroscopy guided exchange of existing right hepatic internal external biliary drain 3. Brush biopsy of common and right hepatic bile ducts 4. Bilateral percutaneous transhepatic cholangiogram MEDICATIONS: Rocephin 2 g IV; The antibiotic was administered within an appropriate time frame prior to the initiation of the procedure. Demerol IV 50 mg ANESTHESIA/SEDATION: Moderate (conscious) sedation was employed during this procedure. A total of Versed 3 mg and Fentanyl 75 mcg was administered intravenously by the radiology nurse. Total intra-service moderate Sedation Time: 56  minutes. The patient's level of consciousness and vital signs were monitored continuously by radiology nursing throughout the procedure under my direct supervision. FLUOROSCOPY: Radiation Exposure Index (as provided by the fluoroscopic device): 98 mGy Kerma COMPLICATIONS: None immediate. PROCEDURE: Informed written consent was obtained from the patient after a thorough discussion of the procedural risks, benefits and alternatives. All questions were addressed. Maximal Sterile Barrier Technique was utilized including caps, mask, sterile gowns, sterile gloves, sterile drape, hand hygiene and skin antiseptic. A timeout was performed prior to the initiation of the procedure. Patient positioned supine on the procedure table. Epigastric and right upper quadrant abdominal wall skin prepped and draped in the usual sterile fashion. Sonographic evaluation of the left hepatic lobe showed mildly dilated ducts. Following local lidocaine administration, a left hepatic lobe duct was successfully accessed with a 21 gauge needle. 21 gauge needle was removed over 0.018 inch guidewire. The guidewire was advanced to the level of the common bile duct. Contrast administered through the right hepatic drain confirmed appropriate positioning within the right hepatic bile ducts and doudenum. The right-sided 74 French drain was  cut and removed over 0.035 inch guidewire and replaced with an 8 [place] sheath. The 8 French sheath was advanced beyond the site of right hepatic and common bile duct stenosis. Two brush biopsies were performed at the stenotic segments of the common and right main hepatic ducts. Both samples were sent to pathology in sterile saline. New 12 French internal external biliary drain was inserted over the 0.035 inch guidewire. The pigtail was formed within the doudenum. Attention was again turned to the left hepatic lobe. Transitional dilator set was inserted over the 0.018 inch guidewire. Kumpe catheter and stiff glidewire  were advanced to the level of the doudenum utilizing fluoroscopic guidance through this access. Kumpe the catheter was removed and a 10 [place] internal external biliary drain was inserted. The distal pigtail was formed within the doudenum. Contrast administered through both drains confirmed appropriate positioning within the doudenum as well as within the intrahepatic bile ducts. Both drains were secured to skin with silk suture and connected to bag. IMPRESSION: 1. Successful brush biopsy of the stenotic segments of the common bile and right main bile ducts. 2. Successful replacement of 12 French right hepatic internal external biliary drain. 3. Successful insertion of 10 French left hepatic internal external biliary drain. Electronically Signed   By: Acquanetta Belling M.D.   On: 02/07/2023 16:36   CT ABDOMEN W CONTRAST Result Date: 02/07/2023 CLINICAL DATA:  77 year old male with a history of biliary obstruction. The original ultrasound 12/26/2022 demonstrates evidence of acute cholecystitis, which was present on a follow-up MRI 12/29/2022. Following this a percutaneous cholecystostomy was placed 01/02/2023 and replaced 01/24/2023. Ongoing symptoms of jaundice and hyperbilirubinemia prompted a second MRI 01/24/2023. MR suggested cholangitis given some enhancement along the ductal system, undrained intrahepatic bile ducts. Internal external biliary drain was placed 01/31/2023. 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 A follow-up CT 01/31/2023 and 02/02/2023 performed, with no radiopaque gallstones accounting for biliary obstruction. The patient has had ongoing hyperbilirubinemia which has plateaued at the level of the proximally 14 and remains symptomatic. He presents for this CT study as a planning study preoperatively 4 left-sided biliary drainage. EXAM: CT ABDOMEN  WITH CONTRAST TECHNIQUE: Multidetector CT imaging of the abdomen was performed using the standard protocol following bolus administration of intravenous contrast. RADIATION DOSE REDUCTION: This exam was performed according to the departmental dose-optimization program which includes automated exposure control, adjustment of the mA and/or kV according to patient size and/or use of iterative reconstruction technique. CONTRAST:  75mL OMNIPAQUE IOHEXOL 350 MG/ML SOLN COMPARISON:  Ultrasound 12/26/2022, MR 12/29/2022, per coli 01/02/2023, MR 01/24/2023, CT 01/31/2023, CT 02/02/2023 PTC and drainage 01/31/2023 FINDINGS: Lower chest: No acute finding of the lower chest. Minimal atelectasis and trace fluid. Hepatobiliary: Left-sided intrahepatic biliary ductal dilatation. There is associated periportal edema. There are a few small mildly dilated intrahepatic ducts of the right liver, particularly of the segment 8. Percutaneous transhepatic internal/external biliary drain via right-sided approach remains adequately position with the radiopaque marker in the bile ducts and the term in a shin of the drain in the small bowel. Percutaneous cholecystostomy tube in place within the fundus of the gallbladder. No radiopaque stones. There are no radiopaque stones identified along the course of the internal/external biliary drain. Region of hypodensity/hypoenhancement at the confluence of the left and right biliary ducts, at the liver hilum on image 19 of series 3. This region  measures 13 mm x 42 mm on the axial images. No cirrhotic changes Pancreas: Attenuation/enhancement of the pancreas within normal limits. Mild ductal dilatation of the pancreatic duct. Spleen: Unremarkable Adrenals/Urinary Tract: - Right adrenal gland:  Unremarkable - Left adrenal gland: Unremarkable. - Right kidney: No hydronephrosis, nephrolithiasis, inflammation, or ureteral dilation. No focal lesion. - Left Kidney: No hydronephrosis, nephrolithiasis,  inflammation, or ureteral dilation. No focal lesion. Stomach/Bowel: - Stomach: Small hiatal hernia.  Otherwise unremarkable stomach. 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 Internal/external biliary drain terminates in the duodenum. Visualized small bowel and colon unremarkable with no distension. Vascular/Lymphatic: Calcifications of the abdominal aorta. Mesenteric arteries and renal arteries patent. Other: None Musculoskeletal: Unchanged configuration of the visualized vertebral bodies including compression fracture of L1 favored to be chronic. No bony canal narrowing. IMPRESSION: Right-sided internal/external biliary drain in adequate position across the right-sided biliary ducts and into the duodenum. The majority of the right-sided ductal system is decompressed, with some mild residual ductal dilatation involving segment 8. Persistent left-sided intrahepatic biliary ductal dilatation. Nonspecific focus of low attenuation/hypoenhancement at the confluence of the left and right-sided biliary ducts in the liver hilum. This may reflect focal edema, however, a Klatskin tumor cannot be excluded as etiology for the ductal dilatation. Percutaneous cholecystostomy in position in the gallbladder. Aortic Atherosclerosis (ICD10-I70.0). Electronically Signed   By: Samuel Mayo D.O.   On: 02/07/2023 11:21   DG CHEST PORT 1 VIEW Result Date: 02/02/2023 CLINICAL DATA:  Fever EXAM: PORTABLE CHEST 1 VIEW COMPARISON:  12/26/2022 x-ray FINDINGS: Status post median sternotomy. Enlarged cardiopericardial silhouette with calcified tortuous aorta. Prosthetic valve. Overlapping cardiac leads. There is some linear opacity at the bases likely scar or atelectasis. No pneumothorax, effusion or edema. Healed right eighth rib fracture. IMPRESSION: Postop chest with enlarged heart and calcified aorta. Basilar atelectasis Electronically Signed   By: Samuel Mayo M.D.   On: 02/02/2023 16:08   CT  ABDOMEN PELVIS WO CONTRAST Result Date: 02/02/2023 CLINICAL DATA:  Anemia. EXAM: CT ABDOMEN AND PELVIS WITHOUT CONTRAST TECHNIQUE: Multidetector CT imaging of the abdomen and pelvis was performed following the standard protocol without IV contrast. RADIATION DOSE REDUCTION: This exam was performed according to the departmental dose-optimization program which includes automated exposure control, adjustment of the mA and/or kV according to patient size and/or use of iterative reconstruction technique. COMPARISON:  January 31, 2023. FINDINGS: Lower chest: Minimal bibasilar subsegmental atelectasis is noted. Hepatobiliary: Percutaneous cholecystostomy tube is again noted with decompression of the bladder. Stable position of internal external biliary drain with distal tip in duodenum. Stable mild left hepatic biliary dilatation with periportal edema. Pancreas: Unremarkable. No pancreatic ductal dilatation or surrounding inflammatory changes. Spleen: Normal in size without focal abnormality. Adrenals/Urinary Tract: Adrenal glands are unremarkable. Kidneys are normal, without renal calculi, focal lesion, or hydronephrosis. Bladder is unremarkable. Stomach/Bowel: Stomach is within normal limits. Appendix appears normal. No evidence of bowel wall thickening, distention, or inflammatory changes. Vascular/Lymphatic: Aortic atherosclerosis. No enlarged abdominal or pelvic lymph nodes. Reproductive: Mild prostatic enlargement is noted. Other: No abdominal wall hernia or abnormality. No abdominopelvic ascites. Musculoskeletal: Old L1 and L5 fractures are again noted. No acute abnormality seen. IMPRESSION: Percutaneous cholecystostomy tube is again noted. Stable position of internal external biliary drain with distal tip in duodenum. Stable mild left hepatic biliary dilatation with periportal edema. Electronically Signed   By: Samuel Mayo M.D.   On: 02/02/2023 13:43   CT ABDOMEN PELVIS W CONTRAST Result Date:  01/31/2023 CLINICAL DATA:  Gallbladder and biliary cancer. EXAM: CT ABDOMEN AND PELVIS WITH CONTRAST TECHNIQUE: Multidetector CT imaging of the abdomen and pelvis was performed using the standard protocol following bolus administration of intravenous contrast. RADIATION DOSE REDUCTION: This exam was performed according to the departmental dose-optimization program which includes automated exposure control, adjustment of the mA and/or kV according to patient size and/or use of iterative reconstruction technique. CONTRAST:  75mL OMNIPAQUE IOHEXOL 350 MG/ML SOLN COMPARISON:  MRI abdomen 01/24/2023. FINDINGS: Lower chest: There is atelectasis in the lung bases with trace bilateral pleural effusions. Hepatobiliary: Transhepatic percutaneous biliary catheter present with distal tip in the duodenal. There is mild intrahepatic biliary ductal dilatation in the left lobe of the liver with some periportal edema similar to prior MRI given differences in technique. There is a small amount of pneumobilia and contrast throughout the biliary tree likely related to recent procedure. No focal liver lesions are seen. Percutaneous cholecystostomy tube present. There some contrast in the gallbladder. The gallbladder is decompressed and there is diffuse wall thickening similar to prior. Pancreas: Unremarkable. No pancreatic ductal dilatation or surrounding inflammatory changes. Spleen: Normal in size without focal abnormality. Adrenals/Urinary Tract: Adrenal glands are unremarkable. Kidneys are normal, without renal calculi, focal lesion, or hydronephrosis. Bladder is unremarkable. Stomach/Bowel: Stomach is within normal limits. Appendix appears normal. No evidence of bowel wall thickening, distention, or inflammatory changes. There is sigmoid colon diverticulosis. Vascular/Lymphatic: No significant vascular findings are present. No enlarged abdominal or pelvic lymph nodes. Reproductive: Prostate gland is enlarged. Other: There is  presacral edema. There is trace free fluid in the right upper quadrant similar to prior. There is no focal abdominal wall hernia. Musculoskeletal: There are compression deformities of T8, T9, T11, L1 and L5 which are favored as chronic, but age indeterminate. The bones are diffusely osteopenic. There is mild body wall edema. IMPRESSION: 1. Transhepatic percutaneous biliary catheter present with distal tip in the duodenum. 2. Percutaneous cholecystostomy tube present. Gallbladder is decompressed with diffuse wall thickening similar to prior. 3. Mild intrahepatic biliary ductal dilatation in the left lobe of the liver with periportal edema similar to prior MRI. 4. Trace free fluid in the right upper quadrant similar to prior. 5. Trace bilateral pleural effusions. 6. Mild body wall edema. 7. Multiple compression deformities of the thoracic and lumbar spine are favored as chronic, but age indeterminate. Correlate clinically. Electronically Signed   By: Samuel Mayo M.D.   On: 01/31/2023 20:37   IR BILIARY DRAIN PLACEMENT WITH CHOLANGIOGRAM Result Date: 01/31/2023 INDICATION: 77 year old male with biliary obstruction, presents for percutaneous transhepatic cholangiogram and internal/external drain placement EXAM: IMAGE GUIDED PERCUTANEOUS TRANSHEPATIC CHOLANGIOGRAM INTERNAL/EXTERNAL BILIARY DRAIN PLACEMENT MEDICATIONS: 2 g Mefoxin; The antibiotic was administered within an appropriate time frame prior to the initiation of the procedure. ANESTHESIA/SEDATION: Moderate (conscious) sedation was employed during this procedure. A total of Versed 2 mg and Fentanyl 150 mcg was administered intravenously by the radiology nurse. Total intra-service moderate Sedation Time: 43 minutes. The patient's level of consciousness and vital signs were monitored continuously by radiology nursing throughout the procedure under my direct supervision. FLUOROSCOPY: Radiation Exposure Index (as provided by the fluoroscopic device): 282 mGy  Kerma COMPLICATIONS: None PROCEDURE: The procedure, risks, benefits, and alternatives were explained to the patient and the patient's family. A complete informed consent was performed, with risk benefit analysis. Specific risks that were discussed for the procedure include bleeding, infection, biliary sepsis, IC use day, organ injury, need for further procedure, need for further surgery,  long-term drain placement, cardiopulmonary collapse, death. Questions regarding the procedure were encouraged and answered. The patient understands and consents to the procedure. Patient is position in supine position on the fluoroscopy table, and the upper abdomen was prepped and draped in the usual sterile fashion. Maximum barrier sterile technique with sterile gowns and gloves were used for the procedure. A timeout was performed prior to the initiation of the procedure. Local anesthesia was provided with 1% lidocaine with epinephrine. Ultrasound survey of the left liver lobe was performed, with then ultrasound of the right liver lobe. We elected to access transhepatic biliary system via the right. 1% lidocaine was used for local anesthesia, with generous infiltration of the skin and subcutaneous tissues in and inter left costal location. A Chiba needle was advanced under ultrasound guidance into the right liver lobe, targeting biliary system. Once the tip of the needle was confirmed within the biliary system by injecting small aliquots of contrast, images were stored of the biliary system after partially opacifying the biliary tree via the needle. An 018 wire was then advanced centrally. The needle was removed, a small incision was made with an 11 blade scalpel, and then a triaxial Bard system was advanced into the biliary system. The metal stiffener and dilator were removed, we confirmed placement with contrast infusion. We then attempted to navigate through stenotic ductal system at the hilum, presumably the common hepatic  duct. A coaxial Glidewire and 4 French glide cath were then used to attempt to navigate across the obstruction at the hilum of the liver. A combination of the glide cath, Glidewire, and a roadrunner wire were required to access the common hepatic duct. Once the catheter and wire were advanced into the external hepatic ducts, further images were acquired. This demonstrated that the duodenal diverticulum was distorting the anatomy at the ampulla. After some initial attempts fail to cross the ampulla, a coon's wire was placed and then a 6 French 35 cm straight sheath was placed into the common bile duct. At this point we were able to navigate the angled diagnostic catheter and the Glidewire through the ampulla into the duodenum. With the diagnostic catheter in the second portion the duodenum, the coon's wire was then placed. Twelve French dilation of the subcutaneous tissue tracks was performed, and then a 51 [place] biliary drain was placed as an internal/external biliary drain. Small amount of contrast confirmed location. The patient tolerated the procedure well and remained hemodynamically stable throughout. No complications were encountered and no significant blood loss was encountered. FINDINGS: Percutaneous transhepatic cholangiogram demonstrates significant stenotic narrowing in the bile ducts at the hilum of the liver, suspicious for malignancy/[place] tumor IMPRESSION: Status post image guided percutaneous transhepatic cholangiogram with internal/external biliary drain placement. Injection of the percutaneous cholecystostomy tube demonstrates that the cystic duct remains occluded. Signed, Samuel Mayo. Miachel Roux, RPVI [facility] Electronically Signed   By: Samuel Mayo D.O.   On: 01/31/2023 16:46   DG C-Arm 1-60 Min-No Report Result Date: 01/29/2023 Fluoroscopy was utilized by the requesting physician.  No radiographic interpretation.     Microbiology: No results found for this or any previous visit (from the past 240 hours).    Labs: Basic Metabolic Panel: Recent Labs  Lab 02/19/23 0220 02/20/23 0204 02/21/23 0215 02/22/23 0601 02/24/23 0426  NA 129* 129* 132* 132* 129*  K 5.0 4.8 4.9 4.5 4.0  CL 93* 93* 95* 98 98  CO2 24 26 27 26 22   GLUCOSE  91 127* 108* 95 90  BUN 55* 57* 46* 38* 32*  CREATININE 1.44* 1.30* 1.06 1.16 1.27*  CALCIUM 8.8* 8.5* 8.7* 8.5* 8.4*   Liver Function Tests: Recent Labs  Lab 02/19/23 0220 02/20/23 0204 02/21/23 0215 02/22/23 0601 02/24/23 0426  AST 163* 144* 148* 137* 140*  ALT 99* 91* 90* 85* 87*  ALKPHOS 240* 235* 244* 220* 231*  BILITOT 9.8* 8.9* 9.1* 8.8* 8.6*  PROT 5.2* 5.1* 5.5* 5.3* 5.3*  ALBUMIN 2.0* 2.0* 2.1* 2.1* 2.1*   No results for input(s): "LIPASE", "AMYLASE" in the last 168 hours. No results for input(s): "AMMONIA" in the last 168 hours. CBC: Recent Labs  Lab 02/18/23 0212  WBC 8.7  HGB 9.2*  HCT 27.0*  MCV 104.7*  PLT 291   Cardiac Enzymes: No results for input(s): "CKTOTAL", "CKMB", "CKMBINDEX", "TROPONINI" in the last 168 hours. BNP: BNP (last 3 results) No results for input(s): "BNP" in the last 8760 hours.  ProBNP (last 3 results) No results for input(s): "PROBNP" in the last 8760 hours.  CBG: No results for input(s): "GLUCAP" in the last 168 hours.     Signed:  [name] MD   [facility] 02/24/2023, 10:29 AM

## 2023-02-24 NOTE — Care Management Important Message (Signed)
Important Message  Patient Details  Name: Samuel Mayo MRN: [id number] Date of Birth: 1945/10/14   Important Message Given:  Yes - Medicare IM     Sherilyn Banker 02/24/2023, 3:21 PM

## 2023-02-24 NOTE — Telephone Encounter (Signed)
Looks like polypharmacy:  3 days ago (02/21/2023) by [name], MD

## 2023-02-24 NOTE — Progress Notes (Signed)
Samuel Mayo to be D/C'd  per MD order.  Discussed with the patient and all questions fully answered.  VSS, Skin clean, dry and intact without evidence of skin break down, no evidence of skin tears noted.  IV catheter discontinued intact. Site without signs and symptoms of complications. Dressing and pressure applied.  An After Visit Summary was printed and given to PTAR.  D/c education completed with patient/family including follow up instructions, medication list, d/c activities limitations if indicated, with other d/c instructions as indicated by MD - patient able to verbalize understanding, all questions fully answered.   Patient instructed to return to ED, call 911, or call MD for any changes in condition.   Patient transported by [facility] via EMS. Son was at bedside.

## 2023-02-24 NOTE — Progress Notes (Signed)
Nurse Katrinka Blazing call back from [facility].I gave him a full report and answered all questions. I advised him if he has any additional questions he can reach me at [phone]. Patient will be in room 122 A.

## 2023-02-25 DIAGNOSIS — R569 Unspecified convulsions: Secondary | ICD-10-CM | POA: Diagnosis not present

## 2023-02-25 DIAGNOSIS — E440 Moderate protein-calorie malnutrition: Secondary | ICD-10-CM | POA: Diagnosis not present

## 2023-02-25 DIAGNOSIS — M109 Gout, unspecified: Secondary | ICD-10-CM | POA: Diagnosis not present

## 2023-02-25 DIAGNOSIS — I482 Chronic atrial fibrillation, unspecified: Secondary | ICD-10-CM | POA: Diagnosis not present

## 2023-02-25 DIAGNOSIS — K819 Cholecystitis, unspecified: Secondary | ICD-10-CM | POA: Diagnosis not present

## 2023-02-25 DIAGNOSIS — M810 Age-related osteoporosis without current pathological fracture: Secondary | ICD-10-CM | POA: Diagnosis not present

## 2023-02-25 DIAGNOSIS — I959 Hypotension, unspecified: Secondary | ICD-10-CM | POA: Diagnosis not present

## 2023-02-25 DIAGNOSIS — C787 Secondary malignant neoplasm of liver and intrahepatic bile duct: Secondary | ICD-10-CM | POA: Diagnosis not present

## 2023-02-25 DIAGNOSIS — C7951 Secondary malignant neoplasm of bone: Secondary | ICD-10-CM | POA: Diagnosis not present

## 2023-02-25 DIAGNOSIS — K831 Obstruction of bile duct: Secondary | ICD-10-CM | POA: Diagnosis not present

## 2023-02-25 DIAGNOSIS — K5900 Constipation, unspecified: Secondary | ICD-10-CM | POA: Diagnosis not present

## 2023-02-27 DIAGNOSIS — I10 Essential (primary) hypertension: Secondary | ICD-10-CM | POA: Diagnosis not present

## 2023-02-27 DIAGNOSIS — C7951 Secondary malignant neoplasm of bone: Secondary | ICD-10-CM | POA: Diagnosis not present

## 2023-02-27 DIAGNOSIS — I480 Paroxysmal atrial fibrillation: Secondary | ICD-10-CM | POA: Diagnosis not present

## 2023-02-27 DIAGNOSIS — R5381 Other malaise: Secondary | ICD-10-CM | POA: Diagnosis not present

## 2023-02-27 DIAGNOSIS — C240 Malignant neoplasm of extrahepatic bile duct: Secondary | ICD-10-CM | POA: Diagnosis not present

## 2023-02-28 ENCOUNTER — Ambulatory Visit (HOSPITAL_COMMUNITY)
Admission: RE | Admit: 2023-02-28 | Discharge: 2023-02-28 | Disposition: A | Discharge status: Home / Self Care | Payer: Medicare Other | Source: Ambulatory Visit | Attending: Hematology | Admitting: Hematology

## 2023-02-28 ENCOUNTER — Telehealth: Payer: Self-pay | Admitting: Hematology

## 2023-02-28 DIAGNOSIS — C7951 Secondary malignant neoplasm of bone: Secondary | ICD-10-CM | POA: Diagnosis not present

## 2023-02-28 DIAGNOSIS — C221 Intrahepatic bile duct carcinoma: Secondary | ICD-10-CM | POA: Insufficient documentation

## 2023-02-28 DIAGNOSIS — C787 Secondary malignant neoplasm of liver and intrahepatic bile duct: Secondary | ICD-10-CM | POA: Diagnosis not present

## 2023-02-28 DIAGNOSIS — I10 Essential (primary) hypertension: Secondary | ICD-10-CM | POA: Diagnosis not present

## 2023-02-28 DIAGNOSIS — R531 Weakness: Secondary | ICD-10-CM | POA: Diagnosis not present

## 2023-02-28 DIAGNOSIS — I482 Chronic atrial fibrillation, unspecified: Secondary | ICD-10-CM | POA: Diagnosis not present

## 2023-02-28 LAB — GLUCOSE, CAPILLARY: Glucose-Capillary: 94 mg/dL (ref 70–99)

## 2023-02-28 MED ORDER — FLUDEOXYGLUCOSE F - 18 (FDG) INJECTION
9.0000 | Freq: Once | INTRAVENOUS | Status: AC | PRN
  Administered 2023-02-28: 8.97 via INTRAVENOUS

## 2023-03-03 ENCOUNTER — Emergency Department (HOSPITAL_COMMUNITY): Payer: Medicare Other

## 2023-03-03 ENCOUNTER — Inpatient Hospital Stay (HOSPITAL_COMMUNITY)
Admission: EM | Admit: 2023-03-03 | Discharge: 2023-03-07 | DRG: 435 | Disposition: A | Discharge status: Hospice | Payer: Medicare Other | Source: Skilled Nursing Facility | Attending: Internal Medicine | Admitting: Internal Medicine

## 2023-03-03 ENCOUNTER — Encounter (HOSPITAL_COMMUNITY): Payer: Self-pay | Admitting: Emergency Medicine

## 2023-03-03 ENCOUNTER — Other Ambulatory Visit: Payer: Self-pay

## 2023-03-03 DIAGNOSIS — C799 Secondary malignant neoplasm of unspecified site: Secondary | ICD-10-CM | POA: Diagnosis not present

## 2023-03-03 DIAGNOSIS — I951 Orthostatic hypotension: Secondary | ICD-10-CM | POA: Diagnosis present

## 2023-03-03 DIAGNOSIS — L570 Actinic keratosis: Secondary | ICD-10-CM | POA: Diagnosis present

## 2023-03-03 DIAGNOSIS — R112 Nausea with vomiting, unspecified: Secondary | ICD-10-CM | POA: Diagnosis not present

## 2023-03-03 DIAGNOSIS — Z8619 Personal history of other infectious and parasitic diseases: Secondary | ICD-10-CM | POA: Diagnosis not present

## 2023-03-03 DIAGNOSIS — Z66 Do not resuscitate: Secondary | ICD-10-CM | POA: Diagnosis not present

## 2023-03-03 DIAGNOSIS — Z800 Family history of malignant neoplasm of digestive organs: Secondary | ICD-10-CM

## 2023-03-03 DIAGNOSIS — R54 Age-related physical debility: Secondary | ICD-10-CM | POA: Diagnosis present

## 2023-03-03 DIAGNOSIS — C7951 Secondary malignant neoplasm of bone: Secondary | ICD-10-CM | POA: Diagnosis not present

## 2023-03-03 DIAGNOSIS — R7989 Other specified abnormal findings of blood chemistry: Secondary | ICD-10-CM | POA: Diagnosis present

## 2023-03-03 DIAGNOSIS — I4892 Unspecified atrial flutter: Secondary | ICD-10-CM | POA: Diagnosis present

## 2023-03-03 DIAGNOSIS — Z7189 Other specified counseling: Secondary | ICD-10-CM

## 2023-03-03 DIAGNOSIS — R4589 Other symptoms and signs involving emotional state: Secondary | ICD-10-CM | POA: Diagnosis not present

## 2023-03-03 DIAGNOSIS — Z7901 Long term (current) use of anticoagulants: Secondary | ICD-10-CM

## 2023-03-03 DIAGNOSIS — R1111 Vomiting without nausea: Secondary | ICD-10-CM | POA: Diagnosis not present

## 2023-03-03 DIAGNOSIS — Z515 Encounter for palliative care: Secondary | ICD-10-CM | POA: Diagnosis not present

## 2023-03-03 DIAGNOSIS — K831 Obstruction of bile duct: Secondary | ICD-10-CM | POA: Diagnosis not present

## 2023-03-03 DIAGNOSIS — E871 Hypo-osmolality and hyponatremia: Secondary | ICD-10-CM | POA: Diagnosis present

## 2023-03-03 DIAGNOSIS — M810 Age-related osteoporosis without current pathological fracture: Secondary | ICD-10-CM | POA: Diagnosis not present

## 2023-03-03 DIAGNOSIS — I4891 Unspecified atrial fibrillation: Secondary | ICD-10-CM | POA: Diagnosis present

## 2023-03-03 DIAGNOSIS — K920 Hematemesis: Secondary | ICD-10-CM | POA: Diagnosis present

## 2023-03-03 DIAGNOSIS — G40909 Epilepsy, unspecified, not intractable, without status epilepticus: Secondary | ICD-10-CM | POA: Diagnosis present

## 2023-03-03 DIAGNOSIS — I517 Cardiomegaly: Secondary | ICD-10-CM | POA: Diagnosis not present

## 2023-03-03 DIAGNOSIS — Z8249 Family history of ischemic heart disease and other diseases of the circulatory system: Secondary | ICD-10-CM

## 2023-03-03 DIAGNOSIS — G893 Neoplasm related pain (acute) (chronic): Secondary | ICD-10-CM | POA: Diagnosis not present

## 2023-03-03 DIAGNOSIS — Z79899 Other long term (current) drug therapy: Secondary | ICD-10-CM

## 2023-03-03 DIAGNOSIS — N179 Acute kidney failure, unspecified: Secondary | ICD-10-CM | POA: Diagnosis present

## 2023-03-03 DIAGNOSIS — C240 Malignant neoplasm of extrahepatic bile duct: Secondary | ICD-10-CM | POA: Diagnosis not present

## 2023-03-03 DIAGNOSIS — Z881 Allergy status to other antibiotic agents status: Secondary | ICD-10-CM

## 2023-03-03 DIAGNOSIS — K922 Gastrointestinal hemorrhage, unspecified: Secondary | ICD-10-CM | POA: Diagnosis present

## 2023-03-03 DIAGNOSIS — Z711 Person with feared health complaint in whom no diagnosis is made: Secondary | ICD-10-CM

## 2023-03-03 DIAGNOSIS — D539 Nutritional anemia, unspecified: Secondary | ICD-10-CM | POA: Diagnosis not present

## 2023-03-03 DIAGNOSIS — R933 Abnormal findings on diagnostic imaging of other parts of digestive tract: Secondary | ICD-10-CM | POA: Diagnosis not present

## 2023-03-03 DIAGNOSIS — I10 Essential (primary) hypertension: Secondary | ICD-10-CM | POA: Diagnosis present

## 2023-03-03 DIAGNOSIS — K5730 Diverticulosis of large intestine without perforation or abscess without bleeding: Secondary | ICD-10-CM | POA: Diagnosis not present

## 2023-03-03 DIAGNOSIS — Z743 Need for continuous supervision: Secondary | ICD-10-CM | POA: Diagnosis not present

## 2023-03-03 DIAGNOSIS — C221 Intrahepatic bile duct carcinoma: Principal | ICD-10-CM | POA: Diagnosis present

## 2023-03-03 DIAGNOSIS — F419 Anxiety disorder, unspecified: Secondary | ICD-10-CM | POA: Diagnosis present

## 2023-03-03 DIAGNOSIS — R1110 Vomiting, unspecified: Secondary | ICD-10-CM | POA: Diagnosis not present

## 2023-03-03 DIAGNOSIS — R519 Headache, unspecified: Secondary | ICD-10-CM | POA: Diagnosis not present

## 2023-03-03 LAB — COMPREHENSIVE METABOLIC PANEL
ALT: 99 U/L — ABNORMAL HIGH (ref 0–44)
AST: 121 U/L — ABNORMAL HIGH (ref 15–41)
Albumin: 2.9 g/dL — ABNORMAL LOW (ref 3.5–5.0)
Alkaline Phosphatase: 479 U/L — ABNORMAL HIGH (ref 38–126)
Anion gap: 18 — ABNORMAL HIGH (ref 5–15)
BUN: 52 mg/dL — ABNORMAL HIGH (ref 8–23)
CO2: 23 mmol/L (ref 22–32)
Calcium: 8.6 mg/dL — ABNORMAL LOW (ref 8.9–10.3)
Chloride: 87 mmol/L — ABNORMAL LOW (ref 98–111)
Creatinine, Ser: 1.44 mg/dL — ABNORMAL HIGH (ref 0.61–1.24)
GFR, Estimated: 49 mL/min — ABNORMAL LOW (ref >60)
Glucose, Bld: 113 mg/dL — ABNORMAL HIGH (ref 70–99)
Potassium: 3.7 mmol/L (ref 3.5–5.1)
Sodium: 128 mmol/L — ABNORMAL LOW (ref 135–145)
Total Bilirubin: 15.5 mg/dL — ABNORMAL HIGH (ref 0.0–1.2)
Total Protein: 7.2 g/dL (ref 6.5–8.1)

## 2023-03-03 LAB — CBC WITH DIFFERENTIAL/PLATELET
Abs Immature Granulocytes: 0.04 10*3/uL (ref 0.00–0.07)
Basophils Absolute: 0 10*3/uL (ref 0.0–0.1)
Basophils Relative: 0 %
Eosinophils Absolute: 0.1 10*3/uL (ref 0.0–0.5)
Eosinophils Relative: 2 %
HCT: 34.9 % — ABNORMAL LOW (ref 39.0–52.0)
Hemoglobin: 11.4 g/dL — ABNORMAL LOW (ref 13.0–17.0)
Immature Granulocytes: 1 %
Lymphocytes Relative: 7 %
Lymphs Abs: 0.5 10*3/uL — ABNORMAL LOW (ref 0.7–4.0)
MCH: 34.5 pg — ABNORMAL HIGH (ref 26.0–34.0)
MCHC: 32.7 g/dL (ref 30.0–36.0)
MCV: 105.8 fL — ABNORMAL HIGH (ref 80.0–100.0)
Monocytes Absolute: 0.8 10*3/uL (ref 0.1–1.0)
Monocytes Relative: 12 %
Neutro Abs: 5.3 10*3/uL (ref 1.7–7.7)
Neutrophils Relative %: 78 %
Platelets: 213 10*3/uL (ref 150–400)
RBC: 3.3 MIL/uL — ABNORMAL LOW (ref 4.22–5.81)
RDW: 15 % (ref 11.5–15.5)
WBC: 6.7 10*3/uL (ref 4.0–10.5)
nRBC: 0 % (ref 0.0–0.2)

## 2023-03-03 LAB — AMMONIA: Ammonia: 27 umol/L (ref 9–35)

## 2023-03-03 LAB — LIPASE, BLOOD: Lipase: 95 U/L — ABNORMAL HIGH (ref 11–51)

## 2023-03-03 LAB — I-STAT CG4 LACTIC ACID, ED: Lactic Acid, Venous: 1.3 mmol/L (ref 0.5–1.9)

## 2023-03-03 MED ORDER — ONDANSETRON HCL 4 MG/2ML IJ SOLN
4.0000 mg | Freq: Once | INTRAMUSCULAR | Status: AC
Start: 2023-03-03 — End: 2023-03-03
  Administered 2023-03-03: 4 mg via INTRAVENOUS
  Filled 2023-03-03: qty 2

## 2023-03-03 MED ORDER — IOHEXOL 300 MG/ML  SOLN
100.0000 mL | Freq: Once | INTRAMUSCULAR | Status: AC | PRN
  Administered 2023-03-03: 100 mL via INTRAVENOUS

## 2023-03-03 MED ORDER — MORPHINE SULFATE (PF) 4 MG/ML IV SOLN
4.0000 mg | Freq: Once | INTRAVENOUS | Status: AC
  Administered 2023-03-03: 4 mg via INTRAVENOUS
  Filled 2023-03-03: qty 1

## 2023-03-03 MED ORDER — PANTOPRAZOLE SODIUM 40 MG IV SOLR
40.0000 mg | Freq: Once | INTRAVENOUS | Status: AC
Start: 2023-03-03 — End: 2023-03-03
  Administered 2023-03-03: 40 mg via INTRAVENOUS
  Filled 2023-03-03: qty 10

## 2023-03-03 MED ORDER — SODIUM CHLORIDE 0.9 % IV BOLUS
1000.0000 mL | Freq: Once | INTRAVENOUS | Status: AC
  Administered 2023-03-03: 1000 mL via INTRAVENOUS

## 2023-03-03 NOTE — ED Triage Notes (Signed)
Pt arriving via [facility] from [facility] for emesis. Had one episode of coffee ground emesis approx 35 minutes ago. Denies diarrhea. New dx of bile duct cancer. Has 3 drains in place. Son reports increased lethargy. Pt had PET scan on Friday awaiting results.

## 2023-03-04 ENCOUNTER — Other Ambulatory Visit: Payer: Self-pay

## 2023-03-04 ENCOUNTER — Inpatient Hospital Stay (HOSPITAL_COMMUNITY): Payer: Medicare Other

## 2023-03-04 DIAGNOSIS — M810 Age-related osteoporosis without current pathological fracture: Secondary | ICD-10-CM | POA: Diagnosis present

## 2023-03-04 DIAGNOSIS — R7989 Other specified abnormal findings of blood chemistry: Secondary | ICD-10-CM | POA: Diagnosis present

## 2023-03-04 DIAGNOSIS — K920 Hematemesis: Secondary | ICD-10-CM | POA: Diagnosis not present

## 2023-03-04 DIAGNOSIS — I4891 Unspecified atrial fibrillation: Secondary | ICD-10-CM | POA: Diagnosis present

## 2023-03-04 DIAGNOSIS — Z7901 Long term (current) use of anticoagulants: Secondary | ICD-10-CM | POA: Diagnosis not present

## 2023-03-04 DIAGNOSIS — G893 Neoplasm related pain (acute) (chronic): Secondary | ICD-10-CM | POA: Diagnosis not present

## 2023-03-04 DIAGNOSIS — Z8249 Family history of ischemic heart disease and other diseases of the circulatory system: Secondary | ICD-10-CM | POA: Diagnosis not present

## 2023-03-04 DIAGNOSIS — Z515 Encounter for palliative care: Secondary | ICD-10-CM | POA: Diagnosis not present

## 2023-03-04 DIAGNOSIS — L570 Actinic keratosis: Secondary | ICD-10-CM | POA: Diagnosis present

## 2023-03-04 DIAGNOSIS — Z66 Do not resuscitate: Secondary | ICD-10-CM | POA: Diagnosis present

## 2023-03-04 DIAGNOSIS — C799 Secondary malignant neoplasm of unspecified site: Secondary | ICD-10-CM

## 2023-03-04 DIAGNOSIS — C7951 Secondary malignant neoplasm of bone: Secondary | ICD-10-CM | POA: Diagnosis present

## 2023-03-04 DIAGNOSIS — K922 Gastrointestinal hemorrhage, unspecified: Secondary | ICD-10-CM | POA: Diagnosis present

## 2023-03-04 DIAGNOSIS — Z79899 Other long term (current) drug therapy: Secondary | ICD-10-CM | POA: Diagnosis not present

## 2023-03-04 DIAGNOSIS — K831 Obstruction of bile duct: Secondary | ICD-10-CM | POA: Diagnosis not present

## 2023-03-04 DIAGNOSIS — G40909 Epilepsy, unspecified, not intractable, without status epilepticus: Secondary | ICD-10-CM | POA: Diagnosis present

## 2023-03-04 DIAGNOSIS — N179 Acute kidney failure, unspecified: Secondary | ICD-10-CM | POA: Diagnosis present

## 2023-03-04 DIAGNOSIS — R4589 Other symptoms and signs involving emotional state: Secondary | ICD-10-CM | POA: Diagnosis not present

## 2023-03-04 DIAGNOSIS — F419 Anxiety disorder, unspecified: Secondary | ICD-10-CM | POA: Diagnosis present

## 2023-03-04 DIAGNOSIS — D539 Nutritional anemia, unspecified: Secondary | ICD-10-CM | POA: Diagnosis present

## 2023-03-04 DIAGNOSIS — Z711 Person with feared health complaint in whom no diagnosis is made: Secondary | ICD-10-CM | POA: Diagnosis not present

## 2023-03-04 DIAGNOSIS — Z8619 Personal history of other infectious and parasitic diseases: Secondary | ICD-10-CM | POA: Diagnosis not present

## 2023-03-04 DIAGNOSIS — C240 Malignant neoplasm of extrahepatic bile duct: Secondary | ICD-10-CM | POA: Diagnosis not present

## 2023-03-04 DIAGNOSIS — Z7189 Other specified counseling: Secondary | ICD-10-CM

## 2023-03-04 DIAGNOSIS — Z800 Family history of malignant neoplasm of digestive organs: Secondary | ICD-10-CM | POA: Diagnosis not present

## 2023-03-04 DIAGNOSIS — R933 Abnormal findings on diagnostic imaging of other parts of digestive tract: Secondary | ICD-10-CM

## 2023-03-04 DIAGNOSIS — I10 Essential (primary) hypertension: Secondary | ICD-10-CM | POA: Diagnosis present

## 2023-03-04 DIAGNOSIS — E871 Hypo-osmolality and hyponatremia: Secondary | ICD-10-CM | POA: Diagnosis present

## 2023-03-04 DIAGNOSIS — C221 Intrahepatic bile duct carcinoma: Secondary | ICD-10-CM | POA: Diagnosis not present

## 2023-03-04 DIAGNOSIS — I4892 Unspecified atrial flutter: Secondary | ICD-10-CM | POA: Diagnosis present

## 2023-03-04 DIAGNOSIS — I951 Orthostatic hypotension: Secondary | ICD-10-CM | POA: Diagnosis present

## 2023-03-04 DIAGNOSIS — Z434 Encounter for attention to other artificial openings of digestive tract: Secondary | ICD-10-CM | POA: Diagnosis not present

## 2023-03-04 HISTORY — PX: IR EXCHANGE BILIARY DRAIN: IMG6046

## 2023-03-04 LAB — HEMOGLOBIN AND HEMATOCRIT, BLOOD
HCT: 26.1 % — ABNORMAL LOW (ref 39.0–52.0)
HCT: 31.8 % — ABNORMAL LOW (ref 39.0–52.0)
HCT: 34 % — ABNORMAL LOW (ref 39.0–52.0)
HCT: 31 % — ABNORMAL LOW (ref 39.0–52.0)
Hemoglobin: 9.2 g/dL — ABNORMAL LOW (ref 13.0–17.0)
Hemoglobin: 10.2 g/dL — ABNORMAL LOW (ref 13.0–17.0)
Hemoglobin: 10.9 g/dL — ABNORMAL LOW (ref 13.0–17.0)
Hemoglobin: 10.6 g/dL — ABNORMAL LOW (ref 13.0–17.0)

## 2023-03-04 LAB — URINALYSIS, ROUTINE W REFLEX MICROSCOPIC
Bacteria, UA: NONE SEEN
Glucose, UA: NEGATIVE mg/dL
Hgb urine dipstick: NEGATIVE
Ketones, ur: NEGATIVE mg/dL
Leukocytes,Ua: NEGATIVE
Nitrite: NEGATIVE
Protein, ur: 30 mg/dL — AB
Specific Gravity, Urine: 1.04 — ABNORMAL HIGH (ref 1.005–1.030)
pH: 5 (ref 5.0–8.0)

## 2023-03-04 LAB — TYPE AND SCREEN
ABO/RH(D): B POS
Antibody Screen: NEGATIVE

## 2023-03-04 LAB — MRSA NEXT GEN BY PCR, NASAL: MRSA by PCR Next Gen: NOT DETECTED

## 2023-03-04 MED ORDER — ORAL CARE MOUTH RINSE
15.0000 mL | OROMUCOSAL | Status: DC | PRN
Start: 2023-03-04 — End: 2023-03-07

## 2023-03-04 MED ORDER — TRAZODONE HCL 50 MG PO TABS: 50.0000 mg | ORAL_TABLET | Freq: Every evening | ORAL | Status: DC | PRN

## 2023-03-04 MED ORDER — DIVALPROEX SODIUM 250 MG PO DR TAB
750.0000 mg | DELAYED_RELEASE_TABLET | Freq: Two times a day (BID) | ORAL | Status: DC
  Administered 2023-03-04 – 2023-03-05 (×3): 750 mg via ORAL
  Filled 2023-03-04 (×3): qty 1

## 2023-03-04 MED ORDER — TRAMADOL HCL 50 MG PO TABS
50.0000 mg | ORAL_TABLET | Freq: Four times a day (QID) | ORAL | Status: DC | PRN
  Administered 2023-03-04: 50 mg via ORAL
  Filled 2023-03-04: qty 1

## 2023-03-04 MED ORDER — LIDOCAINE HCL 1 % IJ SOLN
20.0000 mL | Freq: Once | INTRAMUSCULAR | Status: AC
  Administered 2023-03-04: 3 mL via INTRADERMAL

## 2023-03-04 MED ORDER — ALBUTEROL SULFATE (2.5 MG/3ML) 0.083% IN NEBU: 2.5000 mg | INHALATION_SOLUTION | RESPIRATORY_TRACT | Status: DC | PRN

## 2023-03-04 MED ORDER — CHLORHEXIDINE GLUCONATE CLOTH 2 % EX PADS
6.0000 | MEDICATED_PAD | Freq: Every day | CUTANEOUS | Status: DC
  Administered 2023-03-04 – 2023-03-05 (×2): 6 via TOPICAL

## 2023-03-04 MED ORDER — ONDANSETRON HCL 4 MG PO TABS: 4.0000 mg | ORAL_TABLET | Freq: Four times a day (QID) | ORAL | Status: DC | PRN

## 2023-03-04 MED ORDER — LIDOCAINE HCL 1 % IJ SOLN
INTRAMUSCULAR | Status: AC
  Filled 2023-03-04: qty 40

## 2023-03-04 MED ORDER — MIDODRINE HCL 5 MG PO TABS
10.0000 mg | ORAL_TABLET | Freq: Three times a day (TID) | ORAL | Status: DC
  Administered 2023-03-04 – 2023-03-07 (×8): 10 mg via ORAL
  Filled 2023-03-04 (×9): qty 2

## 2023-03-04 MED ORDER — IOHEXOL 300 MG/ML  SOLN
50.0000 mL | Freq: Once | INTRAMUSCULAR | Status: AC | PRN
  Administered 2023-03-04: 30 mL

## 2023-03-04 MED ORDER — POLYETHYLENE GLYCOL 3350 17 G PO PACK: 17.0000 g | PACK | Freq: Every day | ORAL | Status: DC | PRN

## 2023-03-04 MED ORDER — ONDANSETRON HCL 4 MG/2ML IJ SOLN: 4.0000 mg | Freq: Four times a day (QID) | INTRAMUSCULAR | Status: DC | PRN

## 2023-03-04 MED ORDER — PANTOPRAZOLE SODIUM 40 MG IV SOLR
40.0000 mg | Freq: Two times a day (BID) | INTRAVENOUS | Status: DC
  Administered 2023-03-04 – 2023-03-07 (×7): 40 mg via INTRAVENOUS
  Filled 2023-03-04 (×7): qty 10

## 2023-03-04 MED ORDER — SODIUM CHLORIDE 0.9 % IV BOLUS
1000.0000 mL | Freq: Once | INTRAVENOUS | Status: AC
Start: 2023-03-04 — End: 2023-03-04
  Administered 2023-03-04: 1000 mL via INTRAVENOUS

## 2023-03-04 MED ORDER — MORPHINE SULFATE (PF) 2 MG/ML IV SOLN
1.0000 mg | INTRAVENOUS | Status: DC | PRN
  Administered 2023-03-04 – 2023-03-05 (×2): 1 mg via INTRAVENOUS
  Filled 2023-03-04 (×2): qty 1

## 2023-03-04 MED ORDER — SODIUM CHLORIDE 0.9 % IV SOLN: INTRAVENOUS | Status: AC

## 2023-03-04 NOTE — Procedures (Signed)
 Interventional Radiology Procedure Note  Procedure: Exchange under fluoroscopy of percutaneous cholecystostomy tube and right and left percutaneous internal/external biliary drainage catheters  Complications: None  Estimated Blood Loss: < 10 mL  Findings: 10 Fr perc choly, 14 Fr right int/ext biliary drain and 12 Fr left int/ext biliary drain all exchanged over guidewires under fluoro. Attached to new gravity drainage bags.  Marcey DASEN. [place]ver[place], M.D Pager:  [phone]

## 2023-03-04 NOTE — Consult Note (Signed)
 Consultation Note Date: 03/04/2023   Patient Name: Samuel Mayo  DOB: 06-May-1945  MRN: [id number]  Age / Sex: 77 y.o., male   PCP: Jordan, [name], MD Referring Physician: Zella Katha HERO, MD  Reason for Consultation: Establishing goals of care     Chief Complaint/History of Present Illness:   Patient is 77 year old male with past medical history of atrial flutter on Eliquis , recent cholecystitis with cholecystectomy and biliary drains, and recent diagnosis of [place] tumor with multiple long hospital stays with most recent discharge from [facility] on 02/24/2023 after complicated hospitalization requiring management for AKI and hyponatremia who was admitted from subacute rehab on 03/03/2023 for management of upper GI bleed.  GI consulted for recommendations. Palliative medicine team consulted to assist with complex medical decision making.   Extensive review of EMR prior to presenting at bedside. Discussed care with RN for medical updates.   Presented to bedside to meet with patient. Patient laying in bed; appears extremely jaundiced. Patient introduced his significant other and daughter at bedside. With permission, introduced myself and the role of the palliative medicine team in patient's medical care. Spent time learning about patient medical journey. Rollo updated that they have heard patient is not appropraite for surgical follow up at [facility]. They are awaiting to hear input from oncology, De. Lanny, regarding if patient will be a candidate for cancer directed therapies. With permission, did express concern about patient's hypotension and elevated bilirubin putting patient at high risk and he may not receive cancer therapies secondary to this. Patient and family acknowledged this. Noted I would reach out to oncology so they could follow up as would help patient and family to determine pathway for medical care moving forward.   Spent time discussing the similarities and  difference between palliative medicine and hospice care. Discussed generalities of hospice care at home with inpatient hospice (have to be accepted to inpatient hospice, can't just go there). Answered questions as able regarding this. Spent time providing emotional support via active listening.  At this point, patient and family awaiting oncology input to determine pathway for medical care moving forward.  Discussed with IDT including oncology regarding conversation.   Primary Diagnoses  Present on Admission:  Coffee ground emesis   Past Medical History:  Diagnosis Date   AK (actinic keratosis)    Allergy    Chicken pox    Compression fracture of L1 lumbar vertebra (HCC)    Compression fracture of thoracic vertebra (HCC)    Hypertension    Lentigines    Follows with DR Culton (derma)   Seizures (HCC)    Social History   Socioeconomic History   Marital status: Domestic Partner    Spouse name: nathanel   Number of children: Not on file   Years of education: Not on file   Highest education level: Bachelor's degree (e.g., BA, AB, BS)  Occupational History   Not on file  Tobacco Use   Smoking status: Never   Smokeless tobacco: Never   Tobacco comments:    Never smoke 10/29/21   Vaping Use   Vaping status: Never Used  Substance and Sexual Activity   Alcohol use: Yes    Alcohol/week: 14.0 standard drinks of alcohol    Types: 14 Standard drinks or equivalent per week    Comment: 2 glasses of wine nightly 10/29/21   Drug use: No   Sexual activity: Not on file  Other Topics Concern   Not on file  Social History Narrative  Lives with wife Nathanel SAUNDERS handed   Caffeine: 3 C of coffee AM   Social Drivers of Health   Financial Resource Strain: Low Risk  (10/21/2022)   Overall Financial Resource Strain (CARDIA)    Difficulty of Paying Living Expenses: Not hard at all  Food Insecurity: No Food Insecurity (03/04/2023)   Hunger Vital Sign    Worried About Running Out of Food in  the Last Year: Never true    Ran Out of Food in the Last Year: Never true  Transportation Needs: No Transportation Needs (03/04/2023)   PRAPARE - Administrator, Civil Service (Medical): No    Lack of Transportation (Non-Medical): No  Physical Activity: Insufficiently Active (10/21/2022)   Exercise Vital Sign    Days of Exercise per Week: 3 days    Minutes of Exercise per Session: 40 min  Stress: Stress Concern Present (10/21/2022)   [employer] of Occupational Health - Occupational Stress Questionnaire    Feeling of Stress : Rather much  Social Connections: Moderately Isolated (03/04/2023)   Social Connection and Isolation Panel [NHANES]    Frequency of Communication with Friends and Family: More than three times a week    Frequency of Social Gatherings with Friends and Family: Once a week    Attends Religious Services: Never    Database Administrator or Organizations: No    Attends Engineer, Structural: Never    Marital Status: Married   Family History  Problem Relation Age of Onset   Heart disease Mother    Cancer Father    Heart disease Father    Prostate cancer Father    Aneurysm Paternal Grandfather    Heart disease Son        congenital mitral valve dz   Colon cancer Neg Hx    Esophageal cancer Neg Hx    Stomach cancer Neg Hx    Rectal cancer Neg Hx    Scheduled Meds:  Chlorhexidine  Gluconate Cloth  6 each Topical Daily   divalproex   750 mg Oral Q12H   midodrine   10 mg Oral TID WC   pantoprazole  (PROTONIX ) IV  40 mg Intravenous BID   Continuous Infusions: PRN Meds:.albuterol , ondansetron  **OR** ondansetron  (ZOFRAN ) IV, mouth rinse, polyethylene glycol, traMADol , traZODone  Allergies  Allergen Reactions   Other Other (See Comments)    Childhood reaction to Neosporin Ophthalmic ointment caused severe eye redness, irritation   CBC:    Component Value Date/Time   WBC 6.7 03/03/2023 2136   HGB 10.2 (L) 03/04/2023 1058   HGB 13.6  05/20/2022 1026   HCT 31.8 (L) 03/04/2023 1058   HCT 39.3 05/20/2022 1026   PLT 213 03/03/2023 2136   PLT 201 05/20/2022 1026   MCV 105.8 (H) 03/03/2023 2136   MCV 98 (H) 05/20/2022 1026   MCV 96 02/03/2012 1915   NEUTROABS 5.3 03/03/2023 2136   LYMPHSABS 0.5 (L) 03/03/2023 2136   MONOABS 0.8 03/03/2023 2136   EOSABS 0.1 03/03/2023 2136   BASOSABS 0.0 03/03/2023 2136   Comprehensive Metabolic Panel:    Component Value Date/Time   NA 128 (L) 03/03/2023 2136   NA 141 05/20/2022 1026   NA 136 02/03/2012 1915   K 3.7 03/03/2023 2136   K 3.8 02/03/2012 1915   CL 87 (L) 03/03/2023 2136   CL 102 02/03/2012 1915   CO2 23 03/03/2023 2136   CO2 27 02/03/2012 1915   BUN 52 (H) 03/03/2023 2136   BUN 24 05/20/2022  1026   BUN 18 02/03/2012 1915   CREATININE 1.44 (H) 03/03/2023 2136   CREATININE 0.90 02/03/2012 1915   GLUCOSE 113 (H) 03/03/2023 2136   GLUCOSE 87 02/03/2012 1915   CALCIUM 8.6 (L) 03/03/2023 2136   CALCIUM 8.2 (L) 02/03/2012 1915   AST 121 (H) 03/03/2023 2136   AST 31 02/03/2012 1915   ALT 99 (H) 03/03/2023 2136   ALT 31 02/03/2012 1915   ALKPHOS 479 (H) 03/03/2023 2136   ALKPHOS 75 02/03/2012 1915   BILITOT 15.5 (H) 03/03/2023 2136   BILITOT 0.4 05/20/2022 1026   BILITOT 0.5 02/03/2012 1915   PROT 7.2 03/03/2023 2136   PROT 5.9 (L) 05/20/2022 1026   PROT 6.7 02/03/2012 1915   ALBUMIN  2.9 (L) 03/03/2023 2136   ALBUMIN  3.9 05/20/2022 1026   ALBUMIN  3.9 02/03/2012 1915    Physical Exam: Vital Signs: BP 98/62   Pulse 66   Temp 98.1 F (36.7 C) (Oral)   Resp 12   Ht 6' (1.829 m)   Wt 74.5 kg   SpO2 98%   BMI 22.28 kg/m  SpO2: SpO2: 98 % O2 Device: O2 Device: Room Air O2 Flow Rate:   Intake/output summary:  Intake/Output Summary (Last 24 hours) at 03/04/2023 1234 Last data filed at 03/04/2023 1000 Gross per 24 hour  Intake 2258.8 ml  Output 65 ml  Net 2193.8 ml   LBM: Last BM Date :  (PTA) Baseline Weight: Weight: 74.5 kg Most recent weight:  Weight: 74.5 kg  General: NAD, alert, ill appearing, jaundiced, frail  Eyes: scleral icterus  Cardiovascular: RRR Respiratory: no increased work of breathing noted, not in respiratory distress Neuro: A&Ox4, following commands easily Psych: appropriately answers all questions          Palliative Performance Scale: 10%              Additional Data Reviewed: Recent Labs    03/03/23 2136 03/04/23 0438 03/04/23 1058  WBC 6.7  --   --   HGB 11.4* 9.2* 10.2*  PLT 213  --   --   NA 128*  --   --   BUN 52*  --   --   CREATININE 1.44*  --   --     Imaging: CT ABDOMEN PELVIS W CONTRAST CLINICAL DATA:  Vomiting.  New diagnosis of cholangiocarcinoma.  EXAM: CT ABDOMEN AND PELVIS WITH CONTRAST  TECHNIQUE: Multidetector CT imaging of the abdomen and pelvis was performed using the standard protocol following bolus administration of intravenous contrast.  RADIATION DOSE REDUCTION: This exam was performed according to the departmental dose-optimization program which includes automated exposure control, adjustment of the mA and/or kV according to patient size and/or use of iterative reconstruction technique.  CONTRAST:  OMNIPAQUE  IOHEXOL  300 MG/ML  SOLN  COMPARISON:  PET CT 02/28/2023, abdominopelvic CT 02/06/2023  FINDINGS: Lower chest: The heart is enlarged. Atelectasis in the lung bases. Trace left pleural thickening.  Hepatobiliary: Indwelling internal/external biliary drains in the right left lobe of the liver. The internal Pigtails are in the duodenum. Only minimal intrahepatic biliary ductal dilatation above the level of the drains. No intrahepatic fluid collection. The central low-density lesion on prior CT is poorly defined on the current exam. Cholecystostomy tube which is coiled in the gallbladder fundus. Gallbladder is decompressed.  Pancreas: Atrophic. No inflammation. Pancreatic duct is prominent at 4 mm.  Spleen: Normal in size without focal  abnormality.  Adrenals/Urinary Tract: No adrenal nodule. No hydronephrosis. No renal calculi or suspicious  renal lesion. Unremarkable urinary bladder.  Stomach/Bowel: No abnormal gastric distension. There is mild wall thickening about the distal stomach series 3, image 27. No evidence of perforation. No small bowel obstruction or inflammatory change. Normal appendix. Moderate colonic stool burden. Descending and sigmoid diverticulosis without focal diverticulitis.  Vascular/Lymphatic: Aortic atherosclerosis and tortuosity. No aneurysm. No thrombus in the main portal vein. The left intrahepatic portal vein is attenuated. No enlarged lymph nodes in the abdomen or pelvis.  Reproductive: Enlarged prostate gland spans 5.3 cm transverse.  Other: Trace free fluid adjacent to the right lobe of the liver. No free air. No drainable fluid collection.  Musculoskeletal: Chronic hand unchanged compression fractures of T11, L1, and L5. No acute osseous findings.  IMPRESSION: 1. Mild wall thickening about the distal stomach, may represent gastritis or peptic ulcer disease. 2. Indwelling internal/external biliary drains in the right and left lobe of the liver. Only minimal intrahepatic biliary ductal dilatation above the level of the drains. The central low-density lesion on prior CT is poorly defined on the current exam. 3. Cholecystostomy tube is coiled in the gallbladder fundus. Gallbladder is decompressed. 4. Colonic diverticulosis without diverticulitis. 5. Enlarged prostate gland.  Aortic Atherosclerosis (ICD10-I70.0).  Electronically Signed   By: Andrea Gasman M.D.   On: 03/04/2023 01:12 CT Head Wo Contrast CLINICAL DATA:  Headache  EXAM: CT HEAD WITHOUT CONTRAST  TECHNIQUE: Contiguous axial images were obtained from the base of the skull through the vertex without intravenous contrast.  RADIATION DOSE REDUCTION: This exam was performed according to the departmental  dose-optimization program which includes automated exposure control, adjustment of the mA and/or kV according to patient size and/or use of iterative reconstruction technique.  COMPARISON:  04/14/2020  FINDINGS: Brain: There is no mass, hemorrhage or extra-axial collection. The size and configuration of the ventricles and extra-axial CSF spaces are normal. There is hypoattenuation of the white matter, most commonly indicating chronic small vessel disease.  Vascular: No hyperdense vessel or unexpected vascular calcification.  Skull: The visualized skull base, calvarium and extracranial soft tissues are normal.  Sinuses/Orbits: No fluid levels or advanced mucosal thickening of the visualized paranasal sinuses. No mastoid or middle ear effusion. Normal orbits.  Other: None.  IMPRESSION: 1. No acute intracranial abnormality. 2. Chronic small vessel disease.  Electronically Signed   By: Franky Stanford M.D.   On: 03/04/2023 00:35    I personally reviewed recent imaging.   Palliative Care Assessment and Plan Summary of Established Goals of Care and Medical Treatment Preferences   Patient is 77 year old male with past medical history of atrial flutter on Eliquis , seizure disorder, recent cholecystitis with cholecystectomy and biliary drains, and recent diagnosis of [place] tumor with multiple long hospital stays with most recent discharge from [facility] on 02/24/2023 after complicated hospitalization requiring management for AKI and hyponatremia who was admitted from subacute rehab on 03/03/2023 for management of upper GI bleed.  GI consulted for recommendations. Palliative medicine team consulted to assist with complex medical decision making.   # Complex medical decision making/goals of care  -Discussed care with patient and family as detailed above in HPI. Patient and family stated they have been told patient is not a candidate for surgical follow up at [facility] with cancer  progression on PET. Discussed similarities and differences between palliative medicine and hospice. At this time patient and family awaiting oncology input to determine pathway for medical care moving forward. Did express cas would not want to cause harm. Awaiting oncology input.   -  Code Status: Limited: Do not attempt resuscitation (DNR) -DNR-LIMITED -Do Not Intubate/DNI    # Psycho-social/Spiritual Support:  - Support System: significant other, daughter  # Discharge Planning:  To Be Determined  Thank you for allowing the palliative care team to participate in the care [facility].  Tinnie Radar, DO Palliative Care Provider PMT # [phone]  If patient remains symptomatic despite maximum doses, please call PMT at [phone] between 0700 and 1900. Outside of these hours, please call attending, as PMT does not have night coverage.  Personally spent 80 minutes in patient care including extensive chart review (labs, imaging, progress/consult notes, vital signs), medically appropraite exam, discussed with treatment team, education to patient, family, and staff, documenting clinical information, medication review and management, coordination of care, and available advanced directive documents.   *Please note that this is a verbal dictation therefore any spelling or grammatical errors are due to the Dragon [employer] One system interpretation.

## 2023-03-04 NOTE — Consult Note (Addendum)
 Referring Provider: Dr. Terry Hurst Primary Care Physician:  Jordan, [name], MD Primary Gastroenterologist:  Dr. San   Reason for Consultation:  Suspect GI bleed, coffee ground emesis  HPI: Samuel Mayo is a 77 y.o. male with a past medical history of osteoporosis, hypertension, atrial fib/flutter on Eliquis , mitral valve annuloplasty, seizure disorder and recently diagnosed with hilar cholangiocarcinoma/Klatskin tumor with metastasis.  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 He underwent IR drain study as an outpatient 01/24/2023 which showed persistent occlusion of the cystic duct with concern for continued obstruction and his LFTs continued to uptrend with progressive jaundice therefore he was readmitted to the hospital. MRI/MRCP 11/22 showed dilated intrahepatic ducts, new mural thickening of the extrahepatic bile duct, nondilated CBD and decompressed gallbladder with catheter in place. An ERCP  by Dr. Avram was attempted 01/29/2023 but was unsuccessful due to abnormal biliary anatomy. S/P percutaneous internal/external biliary drain placement by IR 11/29 with transhepatic cholangiogram showing stenotic narrowing in the bile ducts at the hilum of the liver concerning for malignancy/Klatskin tumor and the cystic duct remained occluded.CA 19-9  was elevated at 939 concerning for cholangiocarcinoma.  He developed sepsis secondary to cholangitis. Blood cultures positive for Pseudomonas aeruginosa.  Treated with Levaquin  and Flagyl  IV.  S/P left sided biliary drain placed by IR for better decompression and for brush biopsies 12/6.  Cytology report was consistent with adenocarcinoma. Patient was seen by oncologist Dr. Lanny during this hospitalization who assessed due to his age, low performance status his treatment options were very  limited with overall poor prognosis. Outpatient PET scan was planned. Prior consideration for referral to [facility] hepatobiliary service was no longer recommended. Patient was discharged to SNF on 02/24/2023.  Outpatient PET scan was done 02/28/2023 showed mild focal hypermetabolism along the proximal common duct possibly corresponding to cholangiocarcinoma and multifocal osseous metastasis.  Patient has a follow-up appoint with Dr. Lanny scheduled 03/06/2023.  He presented from [facility] rehab via EMS to [facility] ED 03/03/2023 secondary to coffee-ground emesis.  Labs in the ED showed a WBC count of 6.7.  Hemoglobin 11.4 up from 9.2 on 12/17.  Hematocrit 34.9.  MCV 105.8.  Platelet 213.  Sodium 128.  Potassium 3.7.  BUN 52 up from 32.  Creatinine 1.44 up from 1.27.  Albumin  2.9.  Total bili 15.5 up from 8.6.  Alk phos 479 up from 231.  AST 121.  ALT 99.  Lipase 95.  Ammonia 27.  Chest x-ray showed enlarged cardiac shadow and old right rib fractures.   Labs today: Hemoglobin 9.2.  Hematocrit 26.1.  MRSA PCR negative.  CTAP with contrast today showed mild wall thickening to the distal stomach suggestive of gastritis/PUD, indwelling internal/external biliary drains in the right and left lobe of the liver with minimal intrahepatic biliary ductal dilatation above the level of the drains, central lower density lesion seen on prior CT poorly defined on this image study, cholecystostomy tube is coiled in the gallbladder fundus, decompressed gallbladder, colonic diverticulosis without diverticulitis, enlarged prostate and aortic atherosclerosis.  A GI consult was requested for further evaluation regarding upper GI bleed/coffee-ground emesis.  He endorsed vomiting x 1 yesterday, the medical staff at the SNF described coffee-ground emesis.  No frank hematemesis.  No further vomiting since then.  Patient denied preceding nausea or any abdominal pain.  No heartburn or dysphagia.  No NSAIDs.  His last dose of  Eliquis  was yesterday morning.  No prior history of GERD, ulcers or GI bleed.  No bloody or black bowel movements.  His stools are lighter brown in color.  Never had an EGD or screening colonoscopy.  Mother was diagnosed with pancreatic cancer at the age of 59.  Family has requested hospice consult during this admission.  GI PROCEDURES:  ERCP 01/29/2023 by Dr. Avram: - The major papilla was located entirely within a diverticulum - but close to edge.  - The biliary tree was not examined due to the abnormal anatomy  - unable to access.  Past Medical History:  Diagnosis Date   AK (actinic keratosis)    Allergy    Chicken pox    Compression fracture of L1 lumbar vertebra (HCC)    Compression fracture of thoracic vertebra (HCC)    Hypertension    Lentigines    Follows with DR Culton (derma)   Seizures (HCC)     Past Surgical History:  Procedure Laterality Date   CARDIOVERSION N/A 05/14/2021   Procedure: CARDIOVERSION;  Surgeon: Kate Lonni CROME, MD;  Location: [facility] ENDOSCOPY;  Service: Cardiovascular;  Laterality: N/A;   CARDIOVERSION N/A 10/08/2021   Procedure: CARDIOVERSION;  Surgeon: Santo Stanly LABOR, MD;  Location: [facility] ENDOSCOPY;  Service: Cardiovascular;  Laterality: N/A;   COLONOSCOPY     ESOPHAGOGASTRODUODENOSCOPY N/A 01/29/2023   Procedure: ESOPHAGOGASTRODUODENOSCOPY (EGD);  Surgeon: Avram Lupita BRAVO, MD;  Location: [facility] ENDOSCOPY;  Service: Gastroenterology;  Laterality: N/A;   IR BILIARY DRAIN PLACEMENT WITH CHOLANGIOGRAM  01/31/2023   IR CHOLANGIOGRAM EXISTING TUBE  02/13/2023   IR ENDOLUMINAL BX OF BILIARY TREE  02/07/2023   IR ENDOLUMINAL BX OF BILIARY TREE  02/07/2023   IR EXCHANGE BILIARY DRAIN  01/24/2023   IR EXCHANGE BILIARY DRAIN  02/07/2023   IR EXCHANGE BILIARY DRAIN  02/13/2023   IR EXCHANGE BILIARY DRAIN  02/13/2023   IR INT EXT BILIARY DRAIN WITH CHOLANGIOGRAM  02/07/2023   IR PERC CHOLECYSTOSTOMY  01/02/2023   mitral vavle     Congetinal mitral valve  disease.    Prior to Admission medications   Medication Sig Start Date End Date Taking? Authorizing Provider  Cholecalciferol (VITAMIN D -3 PO) Take 1 capsule by mouth every evening.   Yes [provider]  diclofenac  Sodium (VOLTAREN ) 1 % GEL Apply 2 g topically 4 (four) times daily as needed (joint pain). 02/21/23  Yes Samtani, Jai-Gurmukh, MD  divalproex  (DEPAKOTE ) 250 MG DR tablet TAKE 1 TABLET 2 TIMES DAILY IN ADDITION TO 500 MG TWICE A DAY (TOTAL OF 750 MG TWICE A DAY) 02/24/23  Yes Rosemarie Eather RAMAN, MD  ELIQUIS  5 MG TABS tablet TAKE 1 TABLET BY MOUTH TWICE A DAY 08/07/22  Yes Mealor, Augustus E, MD  LUTEIN PO Take 1 tablet by mouth every evening.   Yes [provider]  midodrine  (PROAMATINE ) 10 MG tablet Take 1 tablet (10 mg total) by mouth 3 (three) times daily with meals. 02/21/23  Yes Samtani, Jai-Gurmukh, MD  Multiple Vitamin (MULTIVITAMIN WITH MINERALS) TABS tablet Take 1 tablet by mouth every evening.   Yes [provider]  polyethylene glycol (MIRALAX  / GLYCOLAX ) 17 g packet Take 17 g by mouth daily as needed for mild constipation. 02/21/23  Yes Samtani, Jai-Gurmukh, MD  sodium chloride  flush 0.9 % SOLN injection 10 mLs by Intracatheter route as needed. Flush percutaneous cholecystostomy drain with 10 ml normal saline once daily. 02/21/23 03/23/23 Yes Samtani, Jai-Gurmukh, MD  traMADol  (ULTRAM ) 50  MG tablet Take 1 tablet (50 mg total) by mouth every 6 (six) hours as needed for moderate pain (pain score 4-6). 02/21/23  Yes Samtani, Jai-Gurmukh, MD    Current Facility-Administered Medications  Medication Dose Route Frequency Provider Last Rate Last Admin   0.9 %  sodium chloride  infusion   Intravenous Continuous Shona Terry SAILOR, DO 100 mL/hr at 03/04/23 0553 New Bag at 03/04/23 0553   albuterol  (PROVENTIL ) (2.5 MG/3ML) 0.083% nebulizer solution 2.5 mg  2.5 mg Nebulization Q2H PRN Zella, Mir M, MD       divalproex  (DEPAKOTE ) DR tablet 750 mg  750 mg Oral Q12H  Zella, Mir M, MD       midodrine  (PROAMATINE ) tablet 10 mg  10 mg Oral TID WC Zella, Mir M, MD       ondansetron  (ZOFRAN ) tablet 4 mg  4 mg Oral Q6H PRN Zella, Mir M, MD       Or   ondansetron  (ZOFRAN ) injection 4 mg  4 mg Intravenous Q6H PRN Zella, Mir M, MD       pantoprazole  (PROTONIX ) injection 40 mg  40 mg Intravenous BID Zella, Mir M, MD   40 mg at 03/04/23 0510   polyethylene glycol (MIRALAX  / GLYCOLAX ) packet 17 g  17 g Oral Daily PRN Zella, Mir M, MD       traMADol  (ULTRAM ) tablet 50 mg  50 mg Oral Q6H PRN Zella, Mir M, MD       traZODone  (DESYREL ) tablet 50 mg  50 mg Oral QHS PRN Zella, Mir M, MD       Current Outpatient Medications  Medication Sig Dispense Refill   Cholecalciferol (VITAMIN D -3 PO) Take 1 capsule by mouth every evening.     diclofenac  Sodium (VOLTAREN ) 1 % GEL Apply 2 g topically 4 (four) times daily as needed (joint pain).     divalproex  (DEPAKOTE ) 250 MG DR tablet TAKE 1 TABLET 2 TIMES DAILY IN ADDITION TO 500 MG TWICE A DAY (TOTAL OF 750 MG TWICE A DAY) 180 tablet 1   ELIQUIS  5 MG TABS tablet TAKE 1 TABLET BY MOUTH TWICE A DAY 60 tablet 5   LUTEIN PO Take 1 tablet by mouth every evening.     midodrine  (PROAMATINE ) 10 MG tablet Take 1 tablet (10 mg total) by mouth 3 (three) times daily with meals.     Multiple Vitamin (MULTIVITAMIN WITH MINERALS) TABS tablet Take 1 tablet by mouth every evening.     polyethylene glycol (MIRALAX  / GLYCOLAX ) 17 g packet Take 17 g by mouth daily as needed for mild constipation.     sodium chloride  flush 0.9 % SOLN injection 10 mLs by Intracatheter route as needed. Flush percutaneous cholecystostomy drain with 10 ml normal saline once daily.     traMADol  (ULTRAM ) 50 MG tablet Take 1 tablet (50 mg total) by mouth every 6 (six) hours as needed for moderate pain (pain score 4-6). 30 tablet 0    Allergies as of 03/03/2023 - Review Complete 03/03/2023  Allergen Reaction Noted   Other Other (See  Comments) 09/16/2015    Family History  Problem Relation Age of Onset   Heart disease Mother    Cancer Father    Heart disease Father    Prostate cancer Father    Aneurysm Paternal Grandfather    Heart disease Son        congenital mitral valve dz   Colon cancer Neg Hx    Esophageal cancer Neg Hx  Stomach cancer Neg Hx    Rectal cancer Neg Hx     Social History   Socioeconomic History   Marital status: Domestic Partner    Spouse name: nathanel   Number of children: Not on file   Years of education: Not on file   Highest education level: Bachelor's degree (e.g., BA, AB, BS)  Occupational History   Not on file  Tobacco Use   Smoking status: Never   Smokeless tobacco: Never   Tobacco comments:    Never smoke 10/29/21   Vaping Use   Vaping status: Never Used  Substance and Sexual Activity   Alcohol use: Yes    Alcohol/week: 14.0 standard drinks of alcohol    Types: 14 Standard drinks or equivalent per week    Comment: 2 glasses of wine nightly 10/29/21   Drug use: No   Sexual activity: Not on file  Other Topics Concern   Not on file  Social History Narrative   Lives with wife Nathanel   R handed   Caffeine: 3 C of coffee AM   Social Drivers of Health   Financial Resource Strain: Low Risk  (10/21/2022)   Overall Financial Resource Strain (CARDIA)    Difficulty of Paying Living Expenses: Not hard at all  Food Insecurity: No Food Insecurity (01/25/2023)   Hunger Vital Sign    Worried About Running Out of Food in the Last Year: Never true    Ran Out of Food in the Last Year: Never true  Transportation Needs: No Transportation Needs (01/25/2023)   PRAPARE - Administrator, Civil Service (Medical): No    Lack of Transportation (Non-Medical): No  Physical Activity: Insufficiently Active (10/21/2022)   Exercise Vital Sign    Days of Exercise per Week: 3 days    Minutes of Exercise per Session: 40 min  Stress: Stress Concern Present (10/21/2022)   [employer] - Occupational Stress Questionnaire    Feeling of Stress : Rather much  Social Connections: Moderately Integrated (10/21/2022)   Social Connection and Isolation Panel [NHANES]    Frequency of Communication with Friends and Family: Three times a week    Frequency of Social Gatherings with Friends and Family: Once a week    Attends Religious Services: Never    Database Administrator or Organizations: No    Attends Engineer, Structural: More than 4 times per year    Marital Status: Living with partner  Intimate Partner Violence: Not At Risk (01/25/2023)   Humiliation, Afraid, Rape, and Kick questionnaire    Fear of Current or Ex-Partner: No    Emotionally Abused: No    Physically Abused: No    Sexually Abused: No    Review of Systems: Gen: Denies fever, sweats or chills. No weight loss.  CV: Denies chest pain, palpitations or edema. Resp: Denies cough, shortness of breath of hemoptysis.  GI: + See HPI. GU : Denies urinary burning, blood in urine, increased urinary frequency or incontinence. MS: Denies joint pain, muscles aches or weakness. Derm: Denies rash, itchiness, skin lesions or unhealing ulcers. Psych: Denies depression, anxiety, memory loss or confusion. Heme: Denies easy bruising, bleeding. Neuro:  Denies headaches, dizziness or paresthesias. Endo:  Denies any problems with DM, thyroid  or adrenal function.  Physical Exam: Vital signs in last 24 hours: Temp:  [98 F (36.7 C)-98.6 F (37 C)] 98.1 F (36.7 C) (12/31 0647) Pulse Rate:  [72-110] 110 (12/31 0739) Resp:  [8-18] 12 (  12/31 0739) BP: (87-110)/(59-74) 97/60 (12/31 0739) SpO2:  [95 %-100 %] 98 % (12/31 0739)   General: Alert chronically ill appearing 77 year old male in no acute distress. Head:  Normocephalic and atraumatic. Eyes: Moderate scleral icterus.  Conjunctiva pink. Ears:  Normal auditory acuity. Nose:  No deformity, discharge or lesions. Mouth:  Dentition  intact. No ulcers or lesions.  Neck:  Supple. No lymphadenopathy or thyromegaly.  Lungs: Breath sounds clear throughout. No wheezes, rhonchi or crackles.  Heart: Slightly irregular rhythm, no murmurs. Abdomen: Soft, nondistended.  Biliary drain x 2 with thick bilious drainage in collection bags drain site dressings intact. Rectal: Deferred. Musculoskeletal:  Symmetrical without gross deformities.  Pulses:  Normal pulses noted. Extremities: No edema. Neurologic:  Alert and  oriented x 4. No focal deficits.  Skin: Grossly jaundiced. Psych:  Alert and cooperative. Normal mood and affect.  Intake/Output from previous day: 12/30 0701 - 12/31 0700 In: 1000 [IV Piggyback:1000] Out: -  Intake/Output this shift: No intake/output data recorded.  Lab Results: Recent Labs    03/03/23 2136 03/04/23 0438  WBC 6.7  --   HGB 11.4* 9.2*  HCT 34.9* 26.1*  PLT 213  --    BMET Recent Labs    03/03/23 2136  NA 128*  K 3.7  CL 87*  CO2 23  GLUCOSE 113*  BUN 52*  CREATININE 1.44*  CALCIUM 8.6*   LFT Recent Labs    03/03/23 2136  PROT 7.2  ALBUMIN  2.9*  AST 121*  ALT 99*  ALKPHOS 479*  BILITOT 15.5*   PT/INR No results for input(s): LABPROT, INR in the last 72 hours. Hepatitis Panel No results for input(s): HEPBSAG, HCVAB, HEPAIGM, HEPBIGM in the last 72 hours.    Studies/Results: CT ABDOMEN PELVIS W CONTRAST Result Date: 03/04/2023 CLINICAL DATA:  Vomiting.  New diagnosis of cholangiocarcinoma. EXAM: CT ABDOMEN AND PELVIS WITH CONTRAST TECHNIQUE: Multidetector CT imaging of the abdomen and pelvis was performed using the standard protocol following bolus administration of intravenous contrast. RADIATION DOSE REDUCTION: This exam was performed according to the departmental dose-optimization program which includes automated exposure control, adjustment of the mA and/or kV according to patient size and/or use of iterative reconstruction technique. CONTRAST:   OMNIPAQUE  IOHEXOL  300 MG/ML  SOLN COMPARISON:  PET CT 02/28/2023, abdominopelvic CT 02/06/2023 FINDINGS: Lower chest: The heart is enlarged. Atelectasis in the lung bases. Trace left pleural thickening. Hepatobiliary: Indwelling internal/external biliary drains in the right left lobe of the liver. The internal Pigtails are in the duodenum. Only minimal intrahepatic biliary ductal dilatation above the level of the drains. No intrahepatic fluid collection. The central low-density lesion on prior CT is poorly defined on the current exam. Cholecystostomy tube which is coiled in the gallbladder fundus. Gallbladder is decompressed. Pancreas: Atrophic. No inflammation. Pancreatic duct is prominent at 4 mm. Spleen: Normal in size without focal abnormality. Adrenals/Urinary Tract: No adrenal nodule. No hydronephrosis. No renal calculi or suspicious renal lesion. Unremarkable urinary bladder. Stomach/Bowel: No abnormal gastric distension. There is mild wall thickening about the distal stomach series 3, image 27. No evidence of perforation. No small bowel obstruction or inflammatory change. Normal appendix. Moderate colonic stool burden. Descending and sigmoid diverticulosis without focal diverticulitis. Vascular/Lymphatic: Aortic atherosclerosis and tortuosity. No aneurysm. No thrombus in the main portal vein. The left intrahepatic portal vein is attenuated. No enlarged lymph nodes in the abdomen or pelvis. Reproductive: Enlarged prostate gland spans 5.3 cm transverse. Other: Trace free fluid adjacent to the right lobe of the  liver. No free air. No drainable fluid collection. Musculoskeletal: Chronic hand unchanged compression fractures of T11, L1, and L5. No acute osseous findings. IMPRESSION: 1. Mild wall thickening about the distal stomach, may represent gastritis or peptic ulcer disease. 2. Indwelling internal/external biliary drains in the right and left lobe of the liver. Only minimal intrahepatic biliary ductal  dilatation above the level of the drains. The central low-density lesion on prior CT is poorly defined on the current exam. 3. Cholecystostomy tube is coiled in the gallbladder fundus. Gallbladder is decompressed. 4. Colonic diverticulosis without diverticulitis. 5. Enlarged prostate gland. Aortic Atherosclerosis (ICD10-I70.0). Electronically Signed   By: Andrea Gasman M.D.   On: 03/04/2023 01:12   CT Head Wo Contrast Result Date: 03/04/2023 CLINICAL DATA:  Headache EXAM: CT HEAD WITHOUT CONTRAST TECHNIQUE: Contiguous axial images were obtained from the base of the skull through the vertex without intravenous contrast. RADIATION DOSE REDUCTION: This exam was performed according to the departmental dose-optimization program which includes automated exposure control, adjustment of the mA and/or kV according to patient size and/or use of iterative reconstruction technique. COMPARISON:  04/14/2020 FINDINGS: Brain: There is no mass, hemorrhage or extra-axial collection. The size and configuration of the ventricles and extra-axial CSF spaces are normal. There is hypoattenuation of the white matter, most commonly indicating chronic small vessel disease. Vascular: No hyperdense vessel or unexpected vascular calcification. Skull: The visualized skull base, calvarium and extracranial soft tissues are normal. Sinuses/Orbits: No fluid levels or advanced mucosal thickening of the visualized paranasal sinuses. No mastoid or middle ear effusion. Normal orbits. Other: None. IMPRESSION: 1. No acute intracranial abnormality. 2. Chronic small vessel disease. Electronically Signed   By: Franky Stanford M.D.   On: 03/04/2023 00:35   DG Chest Port 1 View Result Date: 03/03/2023 CLINICAL DATA:  Recent emesis, initial encounter EXAM: PORTABLE CHEST 1 VIEW COMPARISON:  02/02/2023 FINDINGS: Cardiac shadow is enlarged. Postsurgical changes are again noted. Lungs are well aerated bilaterally. Old rib fractures are noted on the right.  No acute abnormality seen. IMPRESSION: No active disease. Electronically Signed   By: Oneil Devonshire M.D.   On: 03/03/2023 23:46    PET scan 02/28/2023:  EXAM: NUCLEAR MEDICINE PET SKULL BASE TO THIGH   TECHNIQUE: 9.0 mCi F-18 FDG was injected intravenously. Full-ring PET imaging was performed from the skull base to thigh after the radiotracer. CT data was obtained and used for attenuation correction and anatomic localization.   Fasting blood glucose: 94 mg/dl   COMPARISON:  CT chest dated 02/13/2023. CT abdomen dated 02/06/2023. CT abdomen/pelvis dated 02/02/2023.   FINDINGS: Mediastinal blood pool activity: SUV max 3.0   Liver activity: SUV max NA   NECK: No hypermetabolic lymph nodes in the neck.   Mild hypermetabolism along the right posterior neck, favored to be physiologic.   Incidental CT findings: None.   CHEST: No hypermetabolic mediastinal or hilar nodes. No suspicious pulmonary nodules on the CT scan.   Incidental CT findings: Atherosclerotic calcifications of the aortic arch. Mild coronary atherosclerosis of the LAD.   ABDOMEN/PELVIS: Status post percutaneous cholecystostomy. Associated mild hypermetabolism in the cholecystostomy bed, max SUV 8.1.   Indwelling internal/external biliary drains bilaterally, terminating in the proximal duodenum. No residual intrahepatic ductal dilatation on CT. Mild focal hypermetabolism along the proximal aspect of the stents, max SUV 6.3, equivocal.   No hypermetabolic abdominopelvic lymphadenopathy.   Incidental CT findings: Mild left colonic diverticulosis, without evidence of diverticulitis. Atherosclerotic calcifications abdominal aorta and branch vessels.   SKELETON:  Multifocal osseous metastases, including:   --C7 vertebral body, max SUV 8.0   --Left posterior elements at T1, max SUV 5.2   --Left transverse process at T3, max SUV 5.4   --T3 vertebral body, max SUV 6.8   --Left post lateral 5th rib, max SUV  2.4   Incidental CT findings: Known thoracolumbar spine compression fracture deformities, better evaluated on prior CT. Degenerative changes of the thoracolumbar spine. Median sternotomy.   IMPRESSION: Indwelling internal/external biliary drains bilaterally, terminating in the proximal duodenum. No residual intrahepatic ductal dilatation on CT. Mild focal hypermetabolism along the proximal common duct, equivocal, but possibly corresponding to the patient's known cholangiocarcinoma.   Status post percutaneous cholecystostomy. Associated mild hypermetabolism in the cholecystostomy bed, nonspecific and possibly postprocedural.   Multifocal osseous metastases, as above.    IMPRESSION/PLAN:  77 year old male with recent hospital admission with biliary obstruction, diagnosed with hilar cholangiocarcinoma/Klatskin tumor with osseous metastasis and obstructive jaundice. Elevated LFTs with uptrending total bili and alk phos levels. T. Bili 15.5.  Alk phos 479. AST 121. ALT 99.  -Continue oncology follow-up -Biliary drains to gravity -Recommend IR consult to assess patency of internal/external biliary drain and left biliary drain may require biliary drain exchange -Family requests Hospice consult  Suspected upper GI bleed/coffee-ground emesis x 1 episode. CTAP with contrast today showed mild wall thickening to the distal stomach suggestive of gastritis/PUD.  Hemodynamically stable. -NPO -PPI IV twice daily -Ondansetron  4 mg p.o. or IV every 12 hours as needed -Continue to hold Eliquis  -Complex situation, no plans for endoscopic evaluation at this time.  Defer further recommendations to Dr. Charlanne.  -Macrocytic anemia secondary to hilar cholangiocarcinoma/Klatskin tumor + suspected UGI bleeding with reported CGE -Transfuse for hemoglobin less < 8 and as needed if symptomatic -CBC in am  AKI  Hyponatremia, Na+ 128. Prior admission work up consistent with SIADH  A-fib/flutter on  Eliquis . CHADVASC >4.  Last dose of Eliquis  was morning 12/30.  Seizure disorder on Depakote     Samuel Mayo  03/04/2023, 11:12 AM    Attending physician's note   I have taken history, reviewed the chart and examined the patient. I performed a substantive portion of this encounter, including complete performance of at least one of the key components, in conjunction with the APP. I agree with the Advanced Practitioner's note, impression and recommendations.   Stage IV cholangiocarcinoma/Klatskin tumor with multifocal osseous mets.  Overall very poor prognosis.   Obstructive jaundice. S/P exchange of percutaneous cholecystostomy tube/right/ left percutaneous internal/ external biliary drainage catheters today by Dr Luverne  UGI bleed with CT showing gastric wall thickening. Hb stable. HD stable. D/d PUD or tumor infiltration.  Although PET scan was neg, tumor does not seem to be FDG avid.  A-fib on Eliquis  (last dose 12/30)  Plan: -IV  Protonix  -Hold off on EGD. Pt/family leaning towards comfort care/ hospice care. Family does not want to put him through any  further invasive procedures. Besides, no further bleeding. Daughter agrees. -Trend CBC for now. -Agree to hold Eliquis  -Will sign off for now. Dr Mann/Hung covering [facility] until 1/2 8AM.    Anselm Charlanne, MD [facility] [phone]

## 2023-03-04 NOTE — ED Notes (Signed)
 .ED TO INPATIENT HANDOFF REPORT  Name/Age/Gender Samuel Mayo 77 y.o. male  Code Status Code Status History     Date Active Date Inactive Code Status Order ID Comments User Context   02/02/2023 0922 02/24/2023 2214 Limited: Do not attempt resuscitation (DNR) -DNR-LIMITED -Do Not Intubate/DNI  [id number]  Daren Ronnald BRAVO, NP Inpatient   01/24/2023 1341 02/02/2023 0922 Full Code [id number]  Seena Marsa NOVAK, MD ED   12/30/2022 1939 01/03/2023 1907 Full Code [id number]  Lee Kingfisher, MD Inpatient    Questions for Most Recent Historical Code Status (Order [id number])     Question Answer   If pulseless and not breathing No CPR or chest compressions.   In Pre-Arrest Conditions (Patient Is Breathing and Has A Pulse) Do not intubate. Provide all appropriate non-invasive medical interventions. Avoid ICU transfer unless indicated or required.   Consent: Discussion documented in EHR or advanced directives reviewed                Advance Directive Documentation    Flowsheet Row Most Recent Value  Type of Advance Directive Living will  Pre-existing out of facility DNR order (yellow form or pink MOST form) --  MOST Form in Place? --       Home/SNF/Other Nursing Home  Chief Complaint Coffee ground emesis [K92.0]  Level of Care/Admitting Diagnosis ED Disposition     ED Disposition  Admit   Condition  --   Comment  Hospital Area: [facility] [[id number]]  Level of Care: Stepdown [14]  Admit to SDU based on following criteria: Hemodynamic compromise or significant risk of instability:  Patient requiring short term acute titration and management of vasoactive drips, and invasive monitoring (i.e., CVP and Arterial line).  Admit to SDU based on following criteria: Severe physiological/psychological symptoms:  Any diagnosis requiring assessment & intervention at least every 4 hours on an ongoing basis to obtain desired patient outcomes including stability  and rehabilitation  May admit patient to [facility] or [facility] if equivalent level of care is available:: Yes  Covid Evaluation: Asymptomatic - no recent exposure (last 10 days) testing not required  Diagnosis: Coffee ground emesis [331796]  Admitting Physician: SHONA TERRY SAILOR [[id number]]  Attending Physician: SHONA TERRY SAILOR [[id number]]  Certification:: I certify this patient will need inpatient services for at least 2 midnights  Expected Medical Readiness: 03/06/2023          Medical History Past Medical History:  Diagnosis Date   AK (actinic keratosis)    Allergy    Chicken pox    Compression fracture of L1 lumbar vertebra (HCC)    Compression fracture of thoracic vertebra (HCC)    Hypertension    Lentigines    Follows with DR Culton (derma)   Seizures (HCC)     Allergies Allergies  Allergen Reactions   Other Other (See Comments)    Childhood reaction to Neosporin Ophthalmic ointment caused severe eye redness, irritation    IV Location/Drains/Wounds Patient Lines/Drains/Airways Status     Active Line/Drains/Airways     Name Placement date Placement time Site Days   Peripheral IV 03/03/23 20 G Right Antecubital 03/03/23  2133  Antecubital  1   Biliary Tube Cook slip-coat 12 Fr. RUQ 01/24/23  0915  RUQ  39   Biliary Tube Cook slip-coat 12 Fr. LUQ 02/13/23  1654  LUQ  19   Biliary Tube Cook slip-coat 14 Fr. RUQ 02/13/23  1657  RUQ  19   Pressure Injury 02/19/23 Sacrum  Medial Stage 2 -  Partial thickness loss of dermis presenting as a shallow open injury with a red, pink wound bed without slough. 02/19/23  1200  -- 13            Labs/Imaging Results for orders placed or performed during the hospital encounter of 03/03/23 (from the past 48 hours)  Comprehensive metabolic panel     Status: Abnormal   Collection Time: 03/03/23  9:36 PM  Result Value Ref Range   Sodium 128 (L) 135 - 145 mmol/L   Potassium 3.7 3.5 - 5.1 mmol/L   Chloride 87 (L) 98 - 111 mmol/L    CO2 23 22 - 32 mmol/L   Glucose, Bld 113 (H) 70 - 99 mg/dL    Comment: Glucose reference range applies only to samples taken after fasting for at least 8 hours.   BUN 52 (H) 8 - 23 mg/dL   Creatinine, Ser 8.55 (H) 0.61 - 1.24 mg/dL   Calcium 8.6 (L) 8.9 - 10.3 mg/dL   Total Protein 7.2 6.5 - 8.1 g/dL   Albumin  2.9 (L) 3.5 - 5.0 g/dL   AST 878 (H) 15 - 41 U/L   ALT 99 (H) 0 - 44 U/L   Alkaline Phosphatase 479 (H) 38 - 126 U/L   Total Bilirubin 15.5 (H) 0.0 - 1.2 mg/dL   GFR, Estimated 49 (L) >60 mL/min    Comment: (NOTE) Calculated using the CKD-EPI Creatinine Equation (2021)    Anion gap 18 (H) 5 - 15    Comment: Performed at [facility], [address] [place], [place] [address]  CBC with Differential     Status: Abnormal   Collection Time: 03/03/23  9:36 PM  Result Value Ref Range   WBC 6.7 4.0 - 10.5 K/uL   RBC 3.30 (L) 4.22 - 5.81 MIL/uL   Hemoglobin 11.4 (L) 13.0 - 17.0 g/dL   HCT 65.0 (L) 60.9 - 47.9 %   MCV 105.8 (H) 80.0 - 100.0 fL   MCH 34.5 (H) 26.0 - 34.0 pg   MCHC 32.7 30.0 - 36.0 g/dL   RDW 84.9 88.4 - 84.4 %   Platelets 213 150 - 400 K/uL   nRBC 0.0 0.0 - 0.2 %   Neutrophils Relative % 78 %   Neutro Abs 5.3 1.7 - 7.7 K/uL   Lymphocytes Relative 7 %   Lymphs Abs 0.5 (L) 0.7 - 4.0 K/uL   Monocytes Relative 12 %   Monocytes Absolute 0.8 0.1 - 1.0 K/uL   Eosinophils Relative 2 %   Eosinophils Absolute 0.1 0.0 - 0.5 K/uL   Basophils Relative 0 %   Basophils Absolute 0.0 0.0 - 0.1 K/uL   Immature Granulocytes 1 %   Abs Immature Granulocytes 0.04 0.00 - 0.07 K/uL    Comment: Performed at [facility], [address] [place], [place] [address]  Lipase, blood     Status: Abnormal   Collection Time: 03/03/23  9:36 PM  Result Value Ref Range   Lipase 95 (H) 11 - 51 U/L    Comment: Performed at [facility], [address], [place], [place] [address]  Ammonia     Status: None   Collection Time: 03/03/23  10:04 PM  Result Value Ref Range   Ammonia 27 9 - 35 umol/L    Comment: Performed at [facility], [address] [place], [place] [address]  I-Stat CG4 Lactic Acid     Status: None  Collection Time: 03/03/23 10:20 PM  Result Value Ref Range   Lactic Acid, Venous 1.3 0.5 - 1.9 mmol/L  Urinalysis, Routine w reflex microscopic -Urine, Clean Catch     Status: Abnormal   Collection Time: 03/04/23  2:02 AM  Result Value Ref Range   Color, Urine AMBER (A) YELLOW    Comment: BIOCHEMICALS MAY BE AFFECTED BY COLOR   APPearance CLEAR CLEAR   Specific Gravity, Urine 1.040 (H) 1.005 - 1.030   pH 5.0 5.0 - 8.0   Glucose, UA NEGATIVE NEGATIVE mg/dL   Hgb urine dipstick NEGATIVE NEGATIVE   Bilirubin Urine MODERATE (A) NEGATIVE   Ketones, ur NEGATIVE NEGATIVE mg/dL   Protein, ur 30 (A) NEGATIVE mg/dL   Nitrite NEGATIVE NEGATIVE   Leukocytes,Ua NEGATIVE NEGATIVE   RBC / HPF 0-5 0 - 5 RBC/hpf   WBC, UA 0-5 0 - 5 WBC/hpf   Bacteria, UA NONE SEEN NONE SEEN   Squamous Epithelial / HPF 0-5 0 - 5 /HPF   Mucus PRESENT    Hyaline Casts, UA PRESENT     Comment: Performed at [facility], [address], [place], [place] [address]  Hemoglobin and hematocrit, blood     Status: Abnormal   Collection Time: 03/04/23  4:38 AM  Result Value Ref Range   Hemoglobin 9.2 (L) 13.0 - 17.0 g/dL   HCT 73.8 (L) 60.9 - 47.9 %    Comment: Performed at [facility], [address] [place], [place] [address]  Type and screen [facility]     Status: None   Collection Time: 03/04/23  4:38 AM  Result Value Ref Range   ABO/RH(D) B POS    Antibody Screen NEG    Sample Expiration      03/07/2023,2359 Performed at [facility], [address], [place], [place] [address]    CT ABDOMEN PELVIS W CONTRAST Result Date: 03/04/2023 CLINICAL DATA:  Vomiting.  New diagnosis of cholangiocarcinoma. EXAM: CT ABDOMEN AND PELVIS WITH  CONTRAST TECHNIQUE: Multidetector CT imaging of the abdomen and pelvis was performed using the standard protocol following bolus administration of intravenous contrast. RADIATION DOSE REDUCTION: This exam was performed according to the departmental dose-optimization program which includes automated exposure control, adjustment of the mA and/or kV according to patient size and/or use of iterative reconstruction technique. CONTRAST:  OMNIPAQUE  IOHEXOL  300 MG/ML  SOLN COMPARISON:  PET CT 02/28/2023, abdominopelvic CT 02/06/2023 FINDINGS: Lower chest: The heart is enlarged. Atelectasis in the lung bases. Trace left pleural thickening. Hepatobiliary: Indwelling internal/external biliary drains in the right left lobe of the liver. The internal Pigtails are in the duodenum. Only minimal intrahepatic biliary ductal dilatation above the level of the drains. No intrahepatic fluid collection. The central low-density lesion on prior CT is poorly defined on the current exam. Cholecystostomy tube which is coiled in the gallbladder fundus. Gallbladder is decompressed. Pancreas: Atrophic. No inflammation. Pancreatic duct is prominent at 4 mm. Spleen: Normal in size without focal abnormality. Adrenals/Urinary Tract: No adrenal nodule. No hydronephrosis. No renal calculi or suspicious renal lesion. Unremarkable urinary bladder. Stomach/Bowel: No abnormal gastric distension. There is mild wall thickening about the distal stomach series 3, image 27. No evidence of perforation. No small bowel obstruction or inflammatory change. Normal appendix. Moderate colonic stool burden. Descending and sigmoid diverticulosis without focal diverticulitis. Vascular/Lymphatic: Aortic atherosclerosis and tortuosity. No aneurysm. No thrombus in the main portal vein. The left intrahepatic portal vein is attenuated. No enlarged lymph  nodes in the abdomen or pelvis. Reproductive: Enlarged prostate gland spans 5.3 cm transverse. Other: Trace free fluid  adjacent to the right lobe of the liver. No free air. No drainable fluid collection. Musculoskeletal: Chronic hand unchanged compression fractures of T11, L1, and L5. No acute osseous findings. IMPRESSION: 1. Mild wall thickening about the distal stomach, may represent gastritis or peptic ulcer disease. 2. Indwelling internal/external biliary drains in the right and left lobe of the liver. Only minimal intrahepatic biliary ductal dilatation above the level of the drains. The central low-density lesion on prior CT is poorly defined on the current exam. 3. Cholecystostomy tube is coiled in the gallbladder fundus. Gallbladder is decompressed. 4. Colonic diverticulosis without diverticulitis. 5. Enlarged prostate gland. Aortic Atherosclerosis (ICD10-I70.0). Electronically Signed   By: Andrea Gasman M.D.   On: 03/04/2023 01:12   CT Head Wo Contrast Result Date: 03/04/2023 CLINICAL DATA:  Headache EXAM: CT HEAD WITHOUT CONTRAST TECHNIQUE: Contiguous axial images were obtained from the base of the skull through the vertex without intravenous contrast. RADIATION DOSE REDUCTION: This exam was performed according to the departmental dose-optimization program which includes automated exposure control, adjustment of the mA and/or kV according to patient size and/or use of iterative reconstruction technique. COMPARISON:  04/14/2020 FINDINGS: Brain: There is no mass, hemorrhage or extra-axial collection. The size and configuration of the ventricles and extra-axial CSF spaces are normal. There is hypoattenuation of the white matter, most commonly indicating chronic small vessel disease. Vascular: No hyperdense vessel or unexpected vascular calcification. Skull: The visualized skull base, calvarium and extracranial soft tissues are normal. Sinuses/Orbits: No fluid levels or advanced mucosal thickening of the visualized paranasal sinuses. No mastoid or middle ear effusion. Normal orbits. Other: None. IMPRESSION: 1. No acute  intracranial abnormality. 2. Chronic small vessel disease. Electronically Signed   By: Franky Stanford M.D.   On: 03/04/2023 00:35   DG Chest Port 1 View Result Date: 03/03/2023 CLINICAL DATA:  Recent emesis, initial encounter EXAM: PORTABLE CHEST 1 VIEW COMPARISON:  02/02/2023 FINDINGS: Cardiac shadow is enlarged. Postsurgical changes are again noted. Lungs are well aerated bilaterally. Old rib fractures are noted on the right. No acute abnormality seen. IMPRESSION: No active disease. Electronically Signed   By: Oneil Devonshire M.D.   On: 03/03/2023 23:46    Pending Labs Unresulted Labs (From admission, onward)     Start     Ordered   03/04/23 0435  Hemoglobin and hematocrit, blood  Now then every 6 hours,   R (with TIMED occurrences)      03/04/23 0435            Vitals/Pain Today's Vitals   03/04/23 0300 03/04/23 0430 03/04/23 0500 03/04/23 0647  BP: 95/69 (!) 87/71 101/62   Pulse: 77 80 72   Resp: (!) 8 10 11    Temp:    98.1 F (36.7 C)  TempSrc:    Oral  SpO2: 97% 96% 98%   PainSc:        Isolation Precautions No active isolations  Medications Medications  0.9 %  sodium chloride  infusion ( Intravenous New Bag/Given 03/04/23 0553)  pantoprazole  (PROTONIX ) injection 40 mg (40 mg Intravenous Given 03/04/23 0510)  ondansetron  (ZOFRAN ) injection 4 mg (4 mg Intravenous Given 03/03/23 2209)  sodium chloride  0.9 % bolus 1,000 mL (0 mLs Intravenous Stopped 03/03/23 2336)  pantoprazole  (PROTONIX ) injection 40 mg (40 mg Intravenous Given 03/03/23 2210)  morphine  (PF) 4 MG/ML injection 4 mg (4 mg Intravenous Given 03/03/23 2223)  iohexol  (OMNIPAQUE ) 300 MG/ML solution 100 mL (100 mLs Intravenous Contrast Given 03/03/23 2320)  sodium chloride  0.9 % bolus 1,000 mL (0 mLs Intravenous Stopped 03/04/23 0553)    Mobility walks with device

## 2023-03-04 NOTE — Progress Notes (Signed)
 Samuel Mayo   DOB:06-01-1945   FM#:[id number]    Assessment and Plan  1.  Adenocarcinoma, hilar cholangiocarcinoma/ Klatskin Tumor Obstructive jaundice with metastatic disease per PET scan Previously seen by Dr Lanny, we were consulted to discuss goals of care. He appears to be rapidly deteriorating, PET confirmed met disease. Given his poor PS, rapidly progressing disease, we discussed that its best to proceed with comfort care and hospice. [name], her partner was hoping that he can be transferred to [facility] place. -Patient realizes his options, understands that he is very weak to receive any treatment and is in complete agreement with hospice.  I spoke to [name] as well, she and family are all a board with hospice.  I will place a hospice referral, she really prefers he goes to inpatient hospice but if he is unable to go to [facility], she is willing to take him home with home hospice.    Subjective:  Patient was seen today to discuss goals of care after receiving a message from palliative care that he continues to deteriorate.  He was awake at the time of my visit.  He appears to understand the PET/CT results and metastatic disease and the fact that he may not be able to receive any treatment.  He is very much agreeable to considering palliative care and hospice on board.  He wants us  to speak to his partner [name].  Objective:  Vitals:   03/04/23 1625 03/04/23 1700  BP:  125/73  Pulse:  79  Resp:  (!) 21  Temp: 98 F (36.7 C)   SpO2:  99%     Intake/Output Summary (Last 24 hours) at 03/04/2023 1737 Last data filed at 03/04/2023 1200 Gross per 24 hour  Intake 2558.73 ml  Output 65 ml  Net 2493.73 ml    REVIEW OF SYSTEMS:   Constitutional: +weak, Denies fevers, chills or abnormal night sweats Eyes: Denies blurriness of vision, double vision or watery eyes Ears, nose, mouth, throat, and face: Denies mucositis or sore throat Respiratory: Denies cough, dyspnea or  wheezes Cardiovascular: Denies palpitation, chest discomfort or lower extremity swelling Gastrointestinal:  Denies nausea, heartburn or change in bowel habits Skin: Denies abnormal skin rashes Lymphatics: Denies new lymphadenopathy or easy bruising Neurological: Denies numbness, tingling or new weaknesses Behavioral/Psych: Mood is stable, no new changes  All other systems were reviewed with the patient and are negative.  PHYSICAL EXAMINATION: ECOG PERFORMANCE STATUS: 3 - Symptomatic, >50% confined to bed  Vitals:   03/04/23 1625 03/04/23 1700  BP:  125/73  Pulse:  79  Resp:  (!) 21  Temp: 98 F (36.7 C)   SpO2:  99%   Filed Weights   03/04/23 0817  Weight: 164 lb 3.9 oz (74.5 kg)    GENERAL: alert, +weak and frail appearing, no distress and comfortable SKIN: +jaundiced skin color, texture, turgor are normal, no rashes or significant lesions  All questions were answered. The patient knows to call the clinic with any problems, questions or concerns.  Labs Reviewed:  Lab Results  Component Value Date   WBC 6.7 03/03/2023   HGB 10.9 (L) 03/04/2023   HCT 34.0 (L) 03/04/2023   MCV 105.8 (H) 03/03/2023   PLT 213 03/03/2023   Recent Labs    12/23/22 1055 12/27/22 0946 12/30/22 1600 01/13/23 0901 01/24/23 1020 02/22/23 0601 02/24/23 0426 03/03/23 2136  NA 131* 132*   < > 132*   < > 132* 129* 128*  K 4.4  4.4   < > 4.1   < > 4.5 4.0 3.7  CL 95* 95*   < > 95*   < > 98 98 87*  CO2 27 29   < > 28   < > 26 22 23   GLUCOSE 111* 116*   < > 134*   < > 95 90 113*  BUN 23 31*   < > 20   < > 38* 32* 52*  CREATININE 1.12 1.08   < > 1.09   < > 1.16 1.27* 1.44*  CALCIUM 9.1 9.0   < > 8.9   < > 8.5* 8.4* 8.6*  GFRNONAA  --   --    < >  --    < > >60 57* 49*  PROT 6.6 6.8   < > 6.1   < > 5.3* 5.3* 7.2  ALBUMIN  3.6 3.5   < > 3.4*   < > 2.1* 2.1* 2.9*  AST 49* 28   < > 135*   < > 137* 140* 121*  ALT 108* 50   < > 150*   < > 85* 87* 99*  ALKPHOS 303* 362*   < > 694*   < > 220* 231*  479*  BILITOT 0.7 1.0   < > 3.9*   < > 8.8* 8.6* 15.5*  BILIDIR 0.3 0.3  --  2.5*  --   --   --   --    < > = values in this interval not displayed.    Studies Reviewed:  IR EXCHANGE BILIARY DRAIN Result Date: 03/04/2023 INDICATION: Cholangiocarcinoma, biliary obstruction and rising bilirubin level with indwelling percutaneous cholecystostomy tube, right-sided percutaneous internal/external biliary drainage catheter and left-sided percutaneous internal/external biliary drainage catheter. Due to significant rise in bilirubin and clinical jaundice, decision has been made to replace all of the percutaneous drains under fluoroscopy as the was scheduled for drain exchange soon. EXAM: 1. EXCHANGE OF PERCUTANEOUS CHOLECYSTOSTOMY TUBE WITH CHOLANGIOGRAM UNDER FLUOROSCOPY 2. EXCHANGE OF RIGHT LOBE PERCUTANEOUS INTERNAL/EXTERNAL BILIARY DRAINAGE CATHETER WITH CHOLANGIOGRAM UNDER FLUOROSCOPY 3. EXCHANGE OF LEFT LOBE PERCUTANEOUS INTERNAL/EXTERNAL BILIARY DRAINAGE CATHETER WITH CHOLANGIOGRAM UNDER FLUOROSCOPY MEDICATIONS: None ANESTHESIA/SEDATION: None FLUOROSCOPY: Radiation Exposure Index (as provided by the fluoroscopic device): 29 mGy Kerma CONTRAST:  30 mL Omnipaque  300 COMPLICATIONS: None immediate. PROCEDURE: Informed written consent was obtained from the patient after a thorough discussion of the procedural risks, benefits and alternatives. All questions were addressed. Maximal Sterile Barrier Technique was utilized including caps, mask, sterile gowns, sterile gloves, sterile drape, hand hygiene and skin antiseptic. A timeout was performed prior to the initiation of the procedure. A 10 French percutaneous cholecystostomy tube was injected with contrast material under fluoroscopy with images saved. The catheter was removed over a guidewire and exchanged for a new 10 French catheter which was formed in the gallbladder lumen. A 14 French right lobe percutaneous internal/external biliary drainage catheter was  injected with contrast material with cholangiogram images saved. The drain was then cut and removed over a guidewire. This was exchanged for a new 14 French catheter which was advanced to the level of the duodenum and formed. This was injected with contrast material to confirm position. A 12 French left lobe percutaneous internal/external biliary drainage catheter was injected with contrast material with cholangiogram images saved. The drain was then cut and removed over a guidewire. This was exchanged for a new 12 French catheter which was advanced to the level of the duodenum and formed. This was injected with contrast material  to confirm position. All 3 biliary drainage catheters were secured at the skin with Prolene retention sutures and attached to gravity drainage bags. FINDINGS: The gallbladder is decompressed. Contrast injection shows persistent essentially occluded cystic duct outflow. There may be a trace amount of contrast entering the common bile duct. Cholangiogram via the right internal/external biliary drainage catheter demonstrates decompressed bile ducts and appropriate tube positioning with the distal catheter formed in the duodenum. The tube contained some debris but was not occluded. A new drainage catheter was placed in a similar position and is draining well after exchange. Cholangiogram via the left internal/external biliary drainage catheter demonstrates decompressed left-sided bile ducts with appropriate positioning and the distal catheter formed in the duodenum. After catheter removal, the locking retention suture from the pre-existing drain was retained in the body and removed after advancing a 3 French micropuncture dilator over one limb of the retention suture and applying tension to the suture. The entire retention suture was able to be removed. A new drainage catheter was placed in a similar position and is draining well after exchange. IMPRESSION: Exchange of 10 French percutaneous  cholecystostomy, 47 French right lobe internal/external biliary drainage and 12 French left lobe internal/external biliary drainage catheters under fluoroscopy as above. All catheters were attached to gravity bag drainage. Electronically Signed   By: Marcey Moan M.D.   On: 03/04/2023 16:56   IR EXCHANGE BILIARY DRAIN Result Date: 03/04/2023 INDICATION: Cholangiocarcinoma, biliary obstruction and rising bilirubin level with indwelling percutaneous cholecystostomy tube, right-sided percutaneous internal/external biliary drainage catheter and left-sided percutaneous internal/external biliary drainage catheter. Due to significant rise in bilirubin and clinical jaundice, decision has been made to replace all of the percutaneous drains under fluoroscopy as the was scheduled for drain exchange soon. EXAM: 1. EXCHANGE OF PERCUTANEOUS CHOLECYSTOSTOMY TUBE WITH CHOLANGIOGRAM UNDER FLUOROSCOPY 2. EXCHANGE OF RIGHT LOBE PERCUTANEOUS INTERNAL/EXTERNAL BILIARY DRAINAGE CATHETER WITH CHOLANGIOGRAM UNDER FLUOROSCOPY 3. EXCHANGE OF LEFT LOBE PERCUTANEOUS INTERNAL/EXTERNAL BILIARY DRAINAGE CATHETER WITH CHOLANGIOGRAM UNDER FLUOROSCOPY MEDICATIONS: None ANESTHESIA/SEDATION: None FLUOROSCOPY: Radiation Exposure Index (as provided by the fluoroscopic device): 29 mGy Kerma CONTRAST:  30 mL Omnipaque  300 COMPLICATIONS: None immediate. PROCEDURE: Informed written consent was obtained from the patient after a thorough discussion of the procedural risks, benefits and alternatives. All questions were addressed. Maximal Sterile Barrier Technique was utilized including caps, mask, sterile gowns, sterile gloves, sterile drape, hand hygiene and skin antiseptic. A timeout was performed prior to the initiation of the procedure. A 10 French percutaneous cholecystostomy tube was injected with contrast material under fluoroscopy with images saved. The catheter was removed over a guidewire and exchanged for a new 10 French catheter which was  formed in the gallbladder lumen. A 14 French right lobe percutaneous internal/external biliary drainage catheter was injected with contrast material with cholangiogram images saved. The drain was then cut and removed over a guidewire. This was exchanged for a new 14 French catheter which was advanced to the level of the duodenum and formed. This was injected with contrast material to confirm position. A 12 French left lobe percutaneous internal/external biliary drainage catheter was injected with contrast material with cholangiogram images saved. The drain was then cut and removed over a guidewire. This was exchanged for a new 12 French catheter which was advanced to the level of the duodenum and formed. This was injected with contrast material to confirm position. All 3 biliary drainage catheters were secured at the skin with Prolene retention sutures and attached to gravity drainage bags. FINDINGS: The gallbladder is  decompressed. Contrast injection shows persistent essentially occluded cystic duct outflow. There may be a trace amount of contrast entering the common bile duct. Cholangiogram via the right internal/external biliary drainage catheter demonstrates decompressed bile ducts and appropriate tube positioning with the distal catheter formed in the duodenum. The tube contained some debris but was not occluded. A new drainage catheter was placed in a similar position and is draining well after exchange. Cholangiogram via the left internal/external biliary drainage catheter demonstrates decompressed left-sided bile ducts with appropriate positioning and the distal catheter formed in the duodenum. After catheter removal, the locking retention suture from the pre-existing drain was retained in the body and removed after advancing a 3 French micropuncture dilator over one limb of the retention suture and applying tension to the suture. The entire retention suture was able to be removed. A new drainage catheter  was placed in a similar position and is draining well after exchange. IMPRESSION: Exchange of 10 French percutaneous cholecystostomy, 81 French right lobe internal/external biliary drainage and 12 French left lobe internal/external biliary drainage catheters under fluoroscopy as above. All catheters were attached to gravity bag drainage. Electronically Signed   By: Marcey Moan M.D.   On: 03/04/2023 16:56   IR EXCHANGE BILIARY DRAIN Result Date: 03/04/2023 INDICATION: Cholangiocarcinoma, biliary obstruction and rising bilirubin level with indwelling percutaneous cholecystostomy tube, right-sided percutaneous internal/external biliary drainage catheter and left-sided percutaneous internal/external biliary drainage catheter. Due to significant rise in bilirubin and clinical jaundice, decision has been made to replace all of the percutaneous drains under fluoroscopy as the was scheduled for drain exchange soon. EXAM: 1. EXCHANGE OF PERCUTANEOUS CHOLECYSTOSTOMY TUBE WITH CHOLANGIOGRAM UNDER FLUOROSCOPY 2. EXCHANGE OF RIGHT LOBE PERCUTANEOUS INTERNAL/EXTERNAL BILIARY DRAINAGE CATHETER WITH CHOLANGIOGRAM UNDER FLUOROSCOPY 3. EXCHANGE OF LEFT LOBE PERCUTANEOUS INTERNAL/EXTERNAL BILIARY DRAINAGE CATHETER WITH CHOLANGIOGRAM UNDER FLUOROSCOPY MEDICATIONS: None ANESTHESIA/SEDATION: None FLUOROSCOPY: Radiation Exposure Index (as provided by the fluoroscopic device): 29 mGy Kerma CONTRAST:  30 mL Omnipaque  300 COMPLICATIONS: None immediate. PROCEDURE: Informed written consent was obtained from the patient after a thorough discussion of the procedural risks, benefits and alternatives. All questions were addressed. Maximal Sterile Barrier Technique was utilized including caps, mask, sterile gowns, sterile gloves, sterile drape, hand hygiene and skin antiseptic. A timeout was performed prior to the initiation of the procedure. A 10 French percutaneous cholecystostomy tube was injected with contrast material under fluoroscopy  with images saved. The catheter was removed over a guidewire and exchanged for a new 10 French catheter which was formed in the gallbladder lumen. A 14 French right lobe percutaneous internal/external biliary drainage catheter was injected with contrast material with cholangiogram images saved. The drain was then cut and removed over a guidewire. This was exchanged for a new 14 French catheter which was advanced to the level of the duodenum and formed. This was injected with contrast material to confirm position. A 12 French left lobe percutaneous internal/external biliary drainage catheter was injected with contrast material with cholangiogram images saved. The drain was then cut and removed over a guidewire. This was exchanged for a new 12 French catheter which was advanced to the level of the duodenum and formed. This was injected with contrast material to confirm position. All 3 biliary drainage catheters were secured at the skin with Prolene retention sutures and attached to gravity drainage bags. FINDINGS: The gallbladder is decompressed. Contrast injection shows persistent essentially occluded cystic duct outflow. There may be a trace amount of contrast entering the common bile duct. Cholangiogram via the right  internal/external biliary drainage catheter demonstrates decompressed bile ducts and appropriate tube positioning with the distal catheter formed in the duodenum. The tube contained some debris but was not occluded. A new drainage catheter was placed in a similar position and is draining well after exchange. Cholangiogram via the left internal/external biliary drainage catheter demonstrates decompressed left-sided bile ducts with appropriate positioning and the distal catheter formed in the duodenum. After catheter removal, the locking retention suture from the pre-existing drain was retained in the body and removed after advancing a 3 French micropuncture dilator over one limb of the retention suture  and applying tension to the suture. The entire retention suture was able to be removed. A new drainage catheter was placed in a similar position and is draining well after exchange. IMPRESSION: Exchange of 10 French percutaneous cholecystostomy, 63 French right lobe internal/external biliary drainage and 12 French left lobe internal/external biliary drainage catheters under fluoroscopy as above. All catheters were attached to gravity bag drainage. Electronically Signed   By: Marcey Moan M.D.   On: 03/04/2023 16:56   CT ABDOMEN PELVIS W CONTRAST Result Date: 03/04/2023 CLINICAL DATA:  Vomiting.  New diagnosis of cholangiocarcinoma. EXAM: CT ABDOMEN AND PELVIS WITH CONTRAST TECHNIQUE: Multidetector CT imaging of the abdomen and pelvis was performed using the standard protocol following bolus administration of intravenous contrast. RADIATION DOSE REDUCTION: This exam was performed according to the departmental dose-optimization program which includes automated exposure control, adjustment of the mA and/or kV according to patient size and/or use of iterative reconstruction technique. CONTRAST:  OMNIPAQUE  IOHEXOL  300 MG/ML  SOLN COMPARISON:  PET CT 02/28/2023, abdominopelvic CT 02/06/2023 FINDINGS: Lower chest: The heart is enlarged. Atelectasis in the lung bases. Trace left pleural thickening. Hepatobiliary: Indwelling internal/external biliary drains in the right left lobe of the liver. The internal Pigtails are in the duodenum. Only minimal intrahepatic biliary ductal dilatation above the level of the drains. No intrahepatic fluid collection. The central low-density lesion on prior CT is poorly defined on the current exam. Cholecystostomy tube which is coiled in the gallbladder fundus. Gallbladder is decompressed. Pancreas: Atrophic. No inflammation. Pancreatic duct is prominent at 4 mm. Spleen: Normal in size without focal abnormality. Adrenals/Urinary Tract: No adrenal nodule. No hydronephrosis. No  renal calculi or suspicious renal lesion. Unremarkable urinary bladder. Stomach/Bowel: No abnormal gastric distension. There is mild wall thickening about the distal stomach series 3, image 27. No evidence of perforation. No small bowel obstruction or inflammatory change. Normal appendix. Moderate colonic stool burden. Descending and sigmoid diverticulosis without focal diverticulitis. Vascular/Lymphatic: Aortic atherosclerosis and tortuosity. No aneurysm. No thrombus in the main portal vein. The left intrahepatic portal vein is attenuated. No enlarged lymph nodes in the abdomen or pelvis. Reproductive: Enlarged prostate gland spans 5.3 cm transverse. Other: Trace free fluid adjacent to the right lobe of the liver. No free air. No drainable fluid collection. Musculoskeletal: Chronic hand unchanged compression fractures of T11, L1, and L5. No acute osseous findings. IMPRESSION: 1. Mild wall thickening about the distal stomach, may represent gastritis or peptic ulcer disease. 2. Indwelling internal/external biliary drains in the right and left lobe of the liver. Only minimal intrahepatic biliary ductal dilatation above the level of the drains. The central low-density lesion on prior CT is poorly defined on the current exam. 3. Cholecystostomy tube is coiled in the gallbladder fundus. Gallbladder is decompressed. 4. Colonic diverticulosis without diverticulitis. 5. Enlarged prostate gland. Aortic Atherosclerosis (ICD10-I70.0). Electronically Signed   By: Andrea Gasman M.D.   On:  03/04/2023 01:12   CT Head Wo Contrast Result Date: 03/04/2023 CLINICAL DATA:  Headache EXAM: CT HEAD WITHOUT CONTRAST TECHNIQUE: Contiguous axial images were obtained from the base of the skull through the vertex without intravenous contrast. RADIATION DOSE REDUCTION: This exam was performed according to the departmental dose-optimization program which includes automated exposure control, adjustment of the mA and/or kV according to  patient size and/or use of iterative reconstruction technique. COMPARISON:  04/14/2020 FINDINGS: Brain: There is no mass, hemorrhage or extra-axial collection. The size and configuration of the ventricles and extra-axial CSF spaces are normal. There is hypoattenuation of the white matter, most commonly indicating chronic small vessel disease. Vascular: No hyperdense vessel or unexpected vascular calcification. Skull: The visualized skull base, calvarium and extracranial soft tissues are normal. Sinuses/Orbits: No fluid levels or advanced mucosal thickening of the visualized paranasal sinuses. No mastoid or middle ear effusion. Normal orbits. Other: None. IMPRESSION: 1. No acute intracranial abnormality. 2. Chronic small vessel disease. Electronically Signed   By: Franky Stanford M.D.   On: 03/04/2023 00:35   DG Chest Port 1 View Result Date: 03/03/2023 CLINICAL DATA:  Recent emesis, initial encounter EXAM: PORTABLE CHEST 1 VIEW COMPARISON:  02/02/2023 FINDINGS: Cardiac shadow is enlarged. Postsurgical changes are again noted. Lungs are well aerated bilaterally. Old rib fractures are noted on the right. No acute abnormality seen. IMPRESSION: No active disease. Electronically Signed   By: Oneil Devonshire M.D.   On: 03/03/2023 23:46   NM PET Image Initial (PI) Skull Base To Thigh Result Date: 02/28/2023 CLINICAL DATA:  Initial treatment strategy for cholangiocarcinoma. EXAM: NUCLEAR MEDICINE PET SKULL BASE TO THIGH TECHNIQUE: 9.0 mCi F-18 FDG was injected intravenously. Full-ring PET imaging was performed from the skull base to thigh after the radiotracer. CT data was obtained and used for attenuation correction and anatomic localization. Fasting blood glucose: 94 mg/dl COMPARISON:  CT chest dated 02/13/2023. CT abdomen dated 02/06/2023. CT abdomen/pelvis dated 02/02/2023. FINDINGS: Mediastinal blood pool activity: SUV max 3.0 Liver activity: SUV max NA NECK: No hypermetabolic lymph nodes in the neck. Mild  hypermetabolism along the right posterior neck, favored to be physiologic. Incidental CT findings: None. CHEST: No hypermetabolic mediastinal or hilar nodes. No suspicious pulmonary nodules on the CT scan. Incidental CT findings: Atherosclerotic calcifications of the aortic arch. Mild coronary atherosclerosis of the LAD. ABDOMEN/PELVIS: Status post percutaneous cholecystostomy. Associated mild hypermetabolism in the cholecystostomy bed, max SUV 8.1. Indwelling internal/external biliary drains bilaterally, terminating in the proximal duodenum. No residual intrahepatic ductal dilatation on CT. Mild focal hypermetabolism along the proximal aspect of the stents, max SUV 6.3, equivocal. No hypermetabolic abdominopelvic lymphadenopathy. Incidental CT findings: Mild left colonic diverticulosis, without evidence of diverticulitis. Atherosclerotic calcifications abdominal aorta and branch vessels. SKELETON: Multifocal osseous metastases, including: --C7 vertebral body, max SUV 8.0 --Left posterior elements at T1, max SUV 5.2 --Left transverse process at T3, max SUV 5.4 --T3 vertebral body, max SUV 6.8 --Left post lateral 5th rib, max SUV 2.4 Incidental CT findings: Known thoracolumbar spine compression fracture deformities, better evaluated on prior CT. Degenerative changes of the thoracolumbar spine. Median sternotomy. IMPRESSION: Indwelling internal/external biliary drains bilaterally, terminating in the proximal duodenum. No residual intrahepatic ductal dilatation on CT. Mild focal hypermetabolism along the proximal common duct, equivocal, but possibly corresponding to the patient's known cholangiocarcinoma. Status post percutaneous cholecystostomy. Associated mild hypermetabolism in the cholecystostomy bed, nonspecific and possibly postprocedural. Multifocal osseous metastases, as above. Electronically Signed   By: Pinkie Pebbles M.D.   On: 02/28/2023  17:04   IR EXCHANGE BILIARY DRAIN Result Date:  02/14/2023 INDICATION: Patient initially diagnosed with Mirizzi syndrome, post cholecystostomy tube placement on 01/02/2023. Unfortunately, patient experienced worsening hyperbilirubinemia, ultimately undergoing attempted though unsuccessful ERCP. As such, patient underwent placement of a right-sided biliary drainage catheter on the 01/31/2023 and ultimately placement of a left-sided biliary drainage catheter on 02/07/2023. 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 Since that time, despite cholecystostomy and bilateral biliary drainage catheter placement, the patient's hyperbilirubinemia has persisted. As such, the patient presents today for cholangiogram and potential biliary drainage catheter exchange and up sizing. EXAM: 1. Cholangiogram via existing cholecystostomy tube. CHOLANGIOGRAM VIA EXISTING CHOLECYSTOSTOMY TUBE 2. FLUOROSCOPIC GUIDED EXCHANGE AND UP SIZING TO NOW 14 FRENCH RIGHT-SIDED BILIARY DRAINAGE CATHETER 3. FLUOROSCOPIC GUIDED EXCHANGE AND UP SIZING TO NOW 12 FRENCH LEFT-SIDED BILIARY DRAINAGE CATHETER COMPARISON:  COMPARISON Image guided left-sided biliary drainage catheter placement and brush biopsy - 02/07/2023 Image guided right-sided biliary drainage catheter placement - 01/31/2023 Cholecystostomy tube exchange - 01/24/2023 Image guided cholecystostomy tube placement - 01/02/2023 CT abdomen and pelvis - 02/06/2023; 02/02/2023 Abdominal MRI - 12/29/2022 CONTRAST:  25 mL Omnipaque -300 administered into the collecting system ANESTHESIA/SEDATION: ANESTHESIA/SEDATION 3 minutes, 6 seconds (117 mGy) FLUOROSCOPY TIME:  3 minutes, 6 seconds (1 image 17 mGy) COMPLICATIONS: None immediate. TECHNIQUE: Informed written consent was obtained from the patient after a discussion of the risks, benefits and alternatives to treatment. Questions regarding the procedure were encouraged and answered. A timeout was performed prior to the initiation of the procedure. Preprocedural  spot fluoroscopic image was obtained of the right upper abdominal quadrant. Cholangiogram were performed via the right and left biliary drainage catheters as well as the cholecystostomy tube. Next, the external portion of the left lower drainage catheter was cut and cannulated with an Amplatz wire which was advanced through the biliary drainage catheter to the level of the duodenum. Under fluoroscopic guided exchange, the pre-existing 10 French left-sided biliary drainage catheter was exchanged for a new, slightly larger, now 62 French biliary drainage catheter with end coiled and locked within the duodenum. The identical procedure was performed for the right-sided biliary drainage catheter, ultimately with exchange of the pre-existing 12 French biliary drainage catheter for a new, slightly larger now 38 French biliary drainage catheter with end coiled and locked within the duodenum. Small amount of contrast was injected via the exchanged biliary drainage catheters and post fluoroscopic and radiographic images were obtained. The biliary drainage catheters were and secured to the skin with interrupted sutures and all drainage catheters were reconnected to gravity bags. Dressings were applied. The patient tolerated the procedure well without immediate postprocedural complication. FINDINGS: Appropriately positioned and functioning cholecystostomy tube. There is persistent occlusion of the cystic duct at its mid/peripheral aspects. Appropriate positioning and functionality of both the right and left-sided biliary drainage catheters with opacification of the intrahepatic biliary system as well as passage of contrast through the catheters to the level of the duodenum. After fluoroscopic guided exchange and up sizing, both biliary drainage catheters are appropriately positioned with end coiled and locked within the duodenum. IMPRESSION: 1. Successful fluoroscopic guided exchange and up sizing of now 60 French right-sided  biliary drainage catheter with end coiled and locked within the duodenum. 2. Successful fluoroscopic guided exchange and up sizing of now 77 French left-sided biliary drainage catheter with end coiled and locked within the duodenum. 3. Appropriately positioned and functioning cholecystostomy tube with persistent complete occlusion of the mid/peripheral aspect of the cystic duct.  The cholecystostomy tube was not exchanged as it was most recently exchanged on 01/24/2023. PLAN: Recommend obtaining daily CMP. If patient's hyperbilirubinemia immediate does not improve in the coming days, would recommend further evaluation with repeat either contrast-enhanced abdominal CT or MRCP as indicated. Electronically Signed   By: Norleen Roulette M.D.   On: 02/14/2023 07:15   IR EXCHANGE BILIARY DRAIN Result Date: 02/14/2023 INDICATION: Patient initially diagnosed with Mirizzi syndrome, post cholecystostomy tube placement on 01/02/2023. Unfortunately, patient experienced worsening hyperbilirubinemia, ultimately undergoing attempted though unsuccessful ERCP. As such, patient underwent placement of a right-sided biliary drainage catheter on the 01/31/2023 and ultimately placement of a left-sided biliary drainage catheter on 02/07/2023. 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 Since that time, despite cholecystostomy and bilateral biliary drainage catheter placement, the patient's hyperbilirubinemia has persisted. As such, the patient presents today for cholangiogram and potential biliary drainage catheter exchange and up sizing. EXAM: 1. Cholangiogram via existing cholecystostomy tube. CHOLANGIOGRAM VIA EXISTING CHOLECYSTOSTOMY TUBE 2. FLUOROSCOPIC GUIDED EXCHANGE AND UP SIZING TO NOW 14 FRENCH RIGHT-SIDED BILIARY DRAINAGE CATHETER 3. FLUOROSCOPIC GUIDED EXCHANGE AND UP SIZING TO NOW 12 FRENCH LEFT-SIDED BILIARY DRAINAGE CATHETER COMPARISON:  COMPARISON Image guided left-sided biliary drainage  catheter placement and brush biopsy - 02/07/2023 Image guided right-sided biliary drainage catheter placement - 01/31/2023 Cholecystostomy tube exchange - 01/24/2023 Image guided cholecystostomy tube placement - 01/02/2023 CT abdomen and pelvis - 02/06/2023; 02/02/2023 Abdominal MRI - 12/29/2022 CONTRAST:  25 mL Omnipaque -300 administered into the collecting system ANESTHESIA/SEDATION: ANESTHESIA/SEDATION 3 minutes, 6 seconds (117 mGy) FLUOROSCOPY TIME:  3 minutes, 6 seconds (1 image 17 mGy) COMPLICATIONS: None immediate. TECHNIQUE: Informed written consent was obtained from the patient after a discussion of the risks, benefits and alternatives to treatment. Questions regarding the procedure were encouraged and answered. A timeout was performed prior to the initiation of the procedure. Preprocedural spot fluoroscopic image was obtained of the right upper abdominal quadrant. Cholangiogram were performed via the right and left biliary drainage catheters as well as the cholecystostomy tube. Next, the external portion of the left lower drainage catheter was cut and cannulated with an Amplatz wire which was advanced through the biliary drainage catheter to the level of the duodenum. Under fluoroscopic guided exchange, the pre-existing 10 French left-sided biliary drainage catheter was exchanged for a new, slightly larger, now 35 French biliary drainage catheter with end coiled and locked within the duodenum. The identical procedure was performed for the right-sided biliary drainage catheter, ultimately with exchange of the pre-existing 12 French biliary drainage catheter for a new, slightly larger now 40 French biliary drainage catheter with end coiled and locked within the duodenum. Small amount of contrast was injected via the exchanged biliary drainage catheters and post fluoroscopic and radiographic images were obtained. The biliary drainage catheters were and secured to the skin with interrupted sutures and all  drainage catheters were reconnected to gravity bags. Dressings were applied. The patient tolerated the procedure well without immediate postprocedural complication. FINDINGS: Appropriately positioned and functioning cholecystostomy tube. There is persistent occlusion of the cystic duct at its mid/peripheral aspects. Appropriate positioning and functionality of both the right and left-sided biliary drainage catheters with opacification of the intrahepatic biliary system as well as passage of contrast through the catheters to the level of the duodenum. After fluoroscopic guided exchange and up sizing, both biliary drainage catheters are appropriately positioned with end coiled and locked within the duodenum. IMPRESSION: 1. Successful fluoroscopic guided exchange and up sizing of now 14  French right-sided biliary drainage catheter with end coiled and locked within the duodenum. 2. Successful fluoroscopic guided exchange and up sizing of now 51 French left-sided biliary drainage catheter with end coiled and locked within the duodenum. 3. Appropriately positioned and functioning cholecystostomy tube with persistent complete occlusion of the mid/peripheral aspect of the cystic duct. The cholecystostomy tube was not exchanged as it was most recently exchanged on 01/24/2023. PLAN: Recommend obtaining daily CMP. If patient's hyperbilirubinemia immediate does not improve in the coming days, would recommend further evaluation with repeat either contrast-enhanced abdominal CT or MRCP as indicated. Electronically Signed   By: Norleen Roulette M.D.   On: 02/14/2023 07:15   IR CHOLANGIOGRAM EXISTING TUBE Result Date: 02/14/2023 INDICATION: Patient initially diagnosed with Mirizzi syndrome, post cholecystostomy tube placement on 01/02/2023. Unfortunately, patient experienced worsening hyperbilirubinemia, ultimately undergoing attempted though unsuccessful ERCP. As such, patient underwent placement of a right-sided biliary drainage  catheter on the 01/31/2023 and ultimately placement of a left-sided biliary drainage catheter on 02/07/2023. 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 Since that time, despite cholecystostomy and bilateral biliary drainage catheter placement, the patient's hyperbilirubinemia has persisted. As such, the patient presents today for cholangiogram and potential biliary drainage catheter exchange and up sizing. EXAM: 1. Cholangiogram via existing cholecystostomy tube. CHOLANGIOGRAM VIA EXISTING CHOLECYSTOSTOMY TUBE 2. FLUOROSCOPIC GUIDED EXCHANGE AND UP SIZING TO NOW 14 FRENCH RIGHT-SIDED BILIARY DRAINAGE CATHETER 3. FLUOROSCOPIC GUIDED EXCHANGE AND UP SIZING TO NOW 12 FRENCH LEFT-SIDED BILIARY DRAINAGE CATHETER COMPARISON:  COMPARISON Image guided left-sided biliary drainage catheter placement and brush biopsy - 02/07/2023 Image guided right-sided biliary drainage catheter placement - 01/31/2023 Cholecystostomy tube exchange - 01/24/2023 Image guided cholecystostomy tube placement - 01/02/2023 CT abdomen and pelvis - 02/06/2023; 02/02/2023 Abdominal MRI - 12/29/2022 CONTRAST:  25 mL Omnipaque -300 administered into the collecting system ANESTHESIA/SEDATION: ANESTHESIA/SEDATION 3 minutes, 6 seconds (117 mGy) FLUOROSCOPY TIME:  3 minutes, 6 seconds (1 image 17 mGy) COMPLICATIONS: None immediate. TECHNIQUE: Informed written consent was obtained from the patient after a discussion of the risks, benefits and alternatives to treatment. Questions regarding the procedure were encouraged and answered. A timeout was performed prior to the initiation of the procedure. Preprocedural spot fluoroscopic image was obtained of the right upper abdominal quadrant. Cholangiogram were performed via the right and left biliary drainage catheters as well as the cholecystostomy tube. Next, the external portion of the left lower drainage catheter was cut and cannulated with an Amplatz wire which was advanced  through the biliary drainage catheter to the level of the duodenum. Under fluoroscopic guided exchange, the pre-existing 10 French left-sided biliary drainage catheter was exchanged for a new, slightly larger, now 80 French biliary drainage catheter with end coiled and locked within the duodenum. The identical procedure was performed for the right-sided biliary drainage catheter, ultimately with exchange of the pre-existing 12 French biliary drainage catheter for a new, slightly larger now 60 French biliary drainage catheter with end coiled and locked within the duodenum. Small amount of contrast was injected via the exchanged biliary drainage catheters and post fluoroscopic and radiographic images were obtained. The biliary drainage catheters were and secured to the skin with interrupted sutures and all drainage catheters were reconnected to gravity bags. Dressings were applied. The patient tolerated the procedure well without immediate postprocedural complication. FINDINGS: Appropriately positioned and functioning cholecystostomy tube. There is persistent occlusion of the cystic duct at its mid/peripheral aspects. Appropriate positioning and functionality of both the right and left-sided biliary drainage catheters  with opacification of the intrahepatic biliary system as well as passage of contrast through the catheters to the level of the duodenum. After fluoroscopic guided exchange and up sizing, both biliary drainage catheters are appropriately positioned with end coiled and locked within the duodenum. IMPRESSION: 1. Successful fluoroscopic guided exchange and up sizing of now 71 French right-sided biliary drainage catheter with end coiled and locked within the duodenum. 2. Successful fluoroscopic guided exchange and up sizing of now 35 French left-sided biliary drainage catheter with end coiled and locked within the duodenum. 3. Appropriately positioned and functioning cholecystostomy tube with persistent  complete occlusion of the mid/peripheral aspect of the cystic duct. The cholecystostomy tube was not exchanged as it was most recently exchanged on 01/24/2023. PLAN: Recommend obtaining daily CMP. If patient's hyperbilirubinemia immediate does not improve in the coming days, would recommend further evaluation with repeat either contrast-enhanced abdominal CT or MRCP as indicated. Electronically Signed   By: Norleen Roulette M.D.   On: 02/14/2023 07:15   CT CHEST WO CONTRAST Result Date: 02/13/2023 CLINICAL DATA:  Hepatocellular carcinoma, cholangiocarcinoma, staging evaluation EXAM: CT CHEST WITHOUT CONTRAST TECHNIQUE: Multidetector CT imaging of the chest was performed following the standard protocol without IV contrast. RADIATION DOSE REDUCTION: This exam was performed according to the departmental dose-optimization program which includes automated exposure control, adjustment of the mA and/or kV according to patient size and/or use of iterative reconstruction technique. COMPARISON:  02/06/2023 FINDINGS: Cardiovascular: Unenhanced imaging of the heart is unremarkable without pericardial effusion. Mitral valve prosthesis. 4.1 cm ascending thoracic aortic aneurysm. Evaluation of the vascular lumen is limited without IV contrast. Atherosclerosis of the aorta and coronary vasculature. Mediastinum/Nodes: No enlarged mediastinal or axillary lymph nodes. Thyroid  gland, trachea, and esophagus demonstrate no significant findings. Lungs/Pleura: No acute airspace disease, effusion, or pneumothorax. Linear consolidation within the right lower lobe consistent with subsegmental atelectasis or scarring. Upper Abdomen: Percutaneous biliary drains are identified traversing the left and right lobes of the liver. Downstream extent excluded by slice selection. Percutaneous cholecystostomy tube is seen coiled within the gallbladder fossa. Minimal pneumobilia. Continued edema at the level of porta hepatis which may reflect known  cholangiocarcinoma. Musculoskeletal: Large sclerotic lesions are seen within the C7 and T3 vertebral body suspicious for metastatic disease. There are chronic compression deformities of T4, T9, T11, and L1. Reconstructed images demonstrate no additional findings. IMPRESSION: 1. Large sclerotic lesions within the C7 and T3 vertebral bodies consistent with bony metastatic disease. 2. No other signs of intrathoracic metastases. 3. Stable findings within the liver consistent with known cholangiocarcinoma and indwelling biliary and gallbladder drains. Please refer to recent CT abdomen exam for full description of findings. 4. 4.1 cm ascending thoracic aortic aneurysm. Recommend annual imaging followup by CTA or MRA. This recommendation follows 2010 ACCF/AHA/AATS/ACR/ASA/SCA/SCAI/SIR/STS/SVM Guidelines for the Diagnosis and Management of Patients with Thoracic Aortic Disease. Circulation. 2010; 121: [id number]. Aortic aneurysm NOS (ICD10-I71.9) 5. Aortic Atherosclerosis (ICD10-I70.0). Coronary artery atherosclerosis. Electronically Signed   By: Ozell Daring M.D.   On: 02/13/2023 18:57   IR INT EXT BILIARY DRAIN WITH CHOLANGIOGRAM Result Date: 02/07/2023 INDICATION: 77 year old gentleman with history of biliary obstruction underwent percutaneous cholecystostomy placement on 01/02/2023 and replacement on 01/24/2023. Continued symptoms of jaundice and hyperbilirubinemia prompteda MRI on 01/24/2023. Enhancement of the ductal system was suspicious for cholangitis. Right hepatic biliary drain was placed on 01/31/2023. Imaging from [facility] on 01/31/2023 was suspicious for Klatskin tumor with irregular narrowing of central right and left main hepatic ducts. Despite placement of the right  hepatic drain, hyperbilirubinemia and jaundice has persisted. He returns today for placement of left hepatic biliary drain as well as brush biopsy of the stenosed segment of the right hepatic duct through a pre-existing access. EXAM: 1.  Ultrasound and fluoroscopy guided left hepatic internal external biliary drain placement 2. Fluoroscopy guided exchange of existing right hepatic internal external biliary drain 3. Brush biopsy of common and right hepatic bile ducts 4. Bilateral percutaneous transhepatic cholangiogram MEDICATIONS: Rocephin  2 g IV; The antibiotic was administered within an appropriate time frame prior to the initiation of the procedure. Demerol  IV 50 mg ANESTHESIA/SEDATION: Moderate (conscious) sedation was employed during this procedure. A total of Versed  3 mg and Fentanyl  75 mcg was administered intravenously by the radiology nurse. Total intra-service moderate Sedation Time: 56 minutes. The patient's level of consciousness and vital signs were monitored continuously by radiology nursing throughout the procedure under my direct supervision. FLUOROSCOPY: Radiation Exposure Index (as provided by the fluoroscopic device): 98 mGy Kerma COMPLICATIONS: None immediate. PROCEDURE: Informed written consent was obtained from the patient after a thorough discussion of the procedural risks, benefits and alternatives. All questions were addressed. Maximal Sterile Barrier Technique was utilized including caps, mask, sterile gowns, sterile gloves, sterile drape, hand hygiene and skin antiseptic. A timeout was performed prior to the initiation of the procedure. Patient positioned supine on the procedure table. Epigastric and right upper quadrant abdominal wall skin prepped and draped in the usual sterile fashion. Sonographic evaluation of the left hepatic lobe showed mildly dilated ducts. Following local lidocaine  administration, a left hepatic lobe duct was successfully accessed with a 21 gauge needle. 21 gauge needle was removed over 0.018 inch guidewire. The guidewire was advanced to the level of the common bile duct. Contrast administered through the right hepatic drain confirmed appropriate positioning within the right hepatic bile ducts and  doudenum. The right-sided 82 French drain was cut and removed over 0.035 inch guidewire and replaced with an 8 French sheath. The 8 French sheath was advanced beyond the site of right hepatic and common bile duct stenosis. Two brush biopsies were performed at the stenotic segments of the common and right main hepatic ducts. Both samples were sent to pathology in sterile saline. New 12 French internal external biliary drain was inserted over the 0.035 inch guidewire. The pigtail was formed within the doudenum. Attention was again turned to the left hepatic lobe. Transitional dilator set was inserted over the 0.018 inch guidewire. Kumpe catheter and stiff glidewire were advanced to the level of the doudenum utilizing fluoroscopic guidance through this access. Kumpe the catheter was removed and a 10 French internal external biliary drain was inserted. The distal pigtail was formed within the doudenum. Contrast administered through both drains confirmed appropriate positioning within the doudenum as well as within the intrahepatic bile ducts. Both drains were secured to skin with silk suture and connected to bag. IMPRESSION: 1. Successful brush biopsy of the stenotic segments of the common bile and right main bile ducts. 2. Successful replacement of 12 French right hepatic internal external biliary drain. 3. Successful insertion of 10 French left hepatic internal external biliary drain. Electronically Signed   By: Aliene Lloyd M.D.   On: 02/07/2023 16:36   IR ENDOLUMINAL BX OF BILIARY TREE Result Date: 02/07/2023 INDICATION: 77 year old gentleman with history of biliary obstruction underwent percutaneous cholecystostomy placement on 01/02/2023 and replacement on 01/24/2023. Continued symptoms of jaundice and hyperbilirubinemia prompteda MRI on 01/24/2023. Enhancement of the ductal system was suspicious for cholangitis. Right hepatic  biliary drain was placed on 01/31/2023. Imaging from [facility] on 01/31/2023 was suspicious  for Klatskin tumor with irregular narrowing of central right and left main hepatic ducts. Despite placement of the right hepatic drain, hyperbilirubinemia and jaundice has persisted. He returns today for placement of left hepatic biliary drain as well as brush biopsy of the stenosed segment of the right hepatic duct through a pre-existing access. EXAM: 1. Ultrasound and fluoroscopy guided left hepatic internal external biliary drain placement 2. Fluoroscopy guided exchange of existing right hepatic internal external biliary drain 3. Brush biopsy of common and right hepatic bile ducts 4. Bilateral percutaneous transhepatic cholangiogram MEDICATIONS: Rocephin  2 g IV; The antibiotic was administered within an appropriate time frame prior to the initiation of the procedure. Demerol  IV 50 mg ANESTHESIA/SEDATION: Moderate (conscious) sedation was employed during this procedure. A total of Versed  3 mg and Fentanyl  75 mcg was administered intravenously by the radiology nurse. Total intra-service moderate Sedation Time: 56 minutes. The patient's level of consciousness and vital signs were monitored continuously by radiology nursing throughout the procedure under my direct supervision. FLUOROSCOPY: Radiation Exposure Index (as provided by the fluoroscopic device): 98 mGy Kerma COMPLICATIONS: None immediate. PROCEDURE: Informed written consent was obtained from the patient after a thorough discussion of the procedural risks, benefits and alternatives. All questions were addressed. Maximal Sterile Barrier Technique was utilized including caps, mask, sterile gowns, sterile gloves, sterile drape, hand hygiene and skin antiseptic. A timeout was performed prior to the initiation of the procedure. Patient positioned supine on the procedure table. Epigastric and right upper quadrant abdominal wall skin prepped and draped in the usual sterile fashion. Sonographic evaluation of the left hepatic lobe showed mildly dilated ducts.  Following local lidocaine  administration, a left hepatic lobe duct was successfully accessed with a 21 gauge needle. 21 gauge needle was removed over 0.018 inch guidewire. The guidewire was advanced to the level of the common bile duct. Contrast administered through the right hepatic drain confirmed appropriate positioning within the right hepatic bile ducts and doudenum. The right-sided 86 French drain was cut and removed over 0.035 inch guidewire and replaced with an 8 French sheath. The 8 French sheath was advanced beyond the site of right hepatic and common bile duct stenosis. Two brush biopsies were performed at the stenotic segments of the common and right main hepatic ducts. Both samples were sent to pathology in sterile saline. New 12 French internal external biliary drain was inserted over the 0.035 inch guidewire. The pigtail was formed within the doudenum. Attention was again turned to the left hepatic lobe. Transitional dilator set was inserted over the 0.018 inch guidewire. Kumpe catheter and stiff glidewire were advanced to the level of the doudenum utilizing fluoroscopic guidance through this access. Kumpe the catheter was removed and a 10 French internal external biliary drain was inserted. The distal pigtail was formed within the doudenum. Contrast administered through both drains confirmed appropriate positioning within the doudenum as well as within the intrahepatic bile ducts. Both drains were secured to skin with silk suture and connected to bag. IMPRESSION: 1. Successful brush biopsy of the stenotic segments of the common bile and right main bile ducts. 2. Successful replacement of 12 French right hepatic internal external biliary drain. 3. Successful insertion of 10 French left hepatic internal external biliary drain. Electronically Signed   By: Aliene Lloyd M.D.   On: 02/07/2023 16:36   IR ENDOLUMINAL BX OF BILIARY TREE Result Date: 02/07/2023 INDICATION: 77 year old gentleman with history  of  biliary obstruction underwent percutaneous cholecystostomy placement on 01/02/2023 and replacement on 01/24/2023. Continued symptoms of jaundice and hyperbilirubinemia prompteda MRI on 01/24/2023. Enhancement of the ductal system was suspicious for cholangitis. Right hepatic biliary drain was placed on 01/31/2023. 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 Despite placement of the right hepatic drain, hyperbilirubinemia and jaundice has persisted. He returns today for placement of left hepatic biliary drain as well as brush biopsy of the stenosed segment of the right hepatic duct through a pre-existing access. EXAM: 1. Ultrasound and fluoroscopy guided left hepatic internal external biliary drain placement 2. Fluoroscopy guided exchange of existing right hepatic internal external biliary drain 3. Brush biopsy of common and right hepatic bile ducts 4. Bilateral percutaneous transhepatic cholangiogram MEDICATIONS: Rocephin  2 g IV; The antibiotic was administered within an appropriate time frame prior to the initiation of the procedure. Demerol  IV 50 mg ANESTHESIA/SEDATION: Moderate (conscious) sedation was employed during this procedure. A total of Versed  3 mg and Fentanyl  75 mcg was administered intravenously by the radiology nurse. Total intra-service moderate Sedation Time: 56 minutes. The patient's level of consciousness and vital signs were monitored continuously by radiology nursing throughout the procedure under my direct supervision. FLUOROSCOPY: Radiation Exposure Index (as provided by the fluoroscopic device): 98 mGy Kerma COMPLICATIONS: None immediate. PROCEDURE: Informed written consent was obtained from the patient after a thorough discussion of the procedural risks, benefits and alternatives. All questions were addressed. Maximal Sterile Barrier Technique was utilized including caps, mask, sterile gowns,  sterile gloves, sterile drape, hand hygiene and skin antiseptic. A timeout was performed prior to the initiation of the procedure. Patient positioned supine on the procedure table. Epigastric and right upper quadrant abdominal wall skin prepped and draped in the usual sterile fashion. Sonographic evaluation of the left hepatic lobe showed mildly dilated ducts. Following local lidocaine  administration, a left hepatic lobe duct was successfully accessed with a 21 gauge needle. 21 gauge needle was removed over 0.018 inch guidewire. The guidewire was advanced to the level of the common bile duct. Contrast administered through the right hepatic drain confirmed appropriate positioning within the right hepatic bile ducts and doudenum. The right-sided 5 French drain was cut and removed over 0.035 inch guidewire and replaced with an 8 French sheath. The 8 French sheath was advanced beyond the site of right hepatic and common bile duct stenosis. Two brush biopsies were performed at the stenotic segments of the common and right main hepatic ducts. Both samples were sent to pathology in sterile saline. New 12 French internal external biliary drain was inserted over the 0.035 inch guidewire. The pigtail was formed within the doudenum. Attention was again turned to the left hepatic lobe. Transitional dilator set was inserted over the 0.018 inch guidewire. Kumpe catheter and stiff glidewire were advanced to the level of the doudenum utilizing fluoroscopic guidance through this access. Kumpe the catheter was removed and a 10 French internal external biliary drain was inserted. The distal pigtail was formed within the doudenum. Contrast administered through both drains confirmed appropriate positioning within the doudenum as well as within the intrahepatic bile ducts. Both drains were secured to skin with silk suture and connected to bag. IMPRESSION: 1. Successful brush biopsy of the stenotic segments of the common bile and right  main bile ducts. 2. Successful replacement of 12 French right hepatic internal external biliary drain. 3. Successful insertion of 10 French left hepatic internal external biliary  drain. Electronically Signed   By: Aliene Lloyd M.D.   On: 02/07/2023 16:36   IR EXCHANGE BILIARY DRAIN Result Date: 02/07/2023 INDICATION: 77 year old gentleman with history of biliary obstruction underwent percutaneous cholecystostomy placement on 01/02/2023 and replacement on 01/24/2023. Continued symptoms of jaundice and hyperbilirubinemia prompteda MRI on 01/24/2023. Enhancement of the ductal system was suspicious for cholangitis. Right hepatic biliary drain was placed on 01/31/2023. Imaging from [facility] on 01/31/2023 was suspicious for Klatskin tumor with irregular narrowing of central right and left main hepatic ducts. Despite placement of the right hepatic drain, hyperbilirubinemia and jaundice has persisted. He returns today for placement of left hepatic biliary drain as well as brush biopsy of the stenosed segment of the right hepatic duct through a pre-existing access. EXAM: 1. Ultrasound and fluoroscopy guided left hepatic internal external biliary drain placement 2. Fluoroscopy guided exchange of existing right hepatic internal external biliary drain 3. Brush biopsy of common and right hepatic bile ducts 4. Bilateral percutaneous transhepatic cholangiogram MEDICATIONS: Rocephin  2 g IV; The antibiotic was administered within an appropriate time frame prior to the initiation of the procedure. Demerol  IV 50 mg ANESTHESIA/SEDATION: Moderate (conscious) sedation was employed during this procedure. A total of Versed  3 mg and Fentanyl  75 mcg was administered intravenously by the radiology nurse. Total intra-service moderate Sedation Time: 56 minutes. The patient's level of consciousness and vital signs were monitored continuously by radiology nursing throughout the procedure under my direct supervision. FLUOROSCOPY: Radiation Exposure  Index (as provided by the fluoroscopic device): 98 mGy Kerma COMPLICATIONS: None immediate. PROCEDURE: Informed written consent was obtained from the patient after a thorough discussion of the procedural risks, benefits and alternatives. All questions were addressed. Maximal Sterile Barrier Technique was utilized including caps, mask, sterile gowns, sterile gloves, sterile drape, hand hygiene and skin antiseptic. A timeout was performed prior to the initiation of the procedure. Patient positioned supine on the procedure table. Epigastric and right upper quadrant abdominal wall skin prepped and draped in the usual sterile fashion. Sonographic evaluation of the left hepatic lobe showed mildly dilated ducts. Following local lidocaine  administration, a left hepatic lobe duct was successfully accessed with a 21 gauge needle. 21 gauge needle was removed over 0.018 inch guidewire. The guidewire was advanced to the level of the common bile duct. Contrast administered through the right hepatic drain confirmed appropriate positioning within the right hepatic bile ducts and doudenum. The right-sided 34 French drain was cut and removed over 0.035 inch guidewire and replaced with an 8 French sheath. The 8 French sheath was advanced beyond the site of right hepatic and common bile duct stenosis. Two brush biopsies were performed at the stenotic segments of the common and right main hepatic ducts. Both samples were sent to pathology in sterile saline. New 12 French internal external biliary drain was inserted over the 0.035 inch guidewire. The pigtail was formed within the doudenum. Attention was again turned to the left hepatic lobe. Transitional dilator set was inserted over the 0.018 inch guidewire. Kumpe catheter and stiff glidewire were advanced to the level of the doudenum utilizing fluoroscopic guidance through this access. Kumpe the catheter was removed and a 10 French internal external biliary drain was inserted. The  distal pigtail was formed within the doudenum. Contrast administered through both drains confirmed appropriate positioning within the doudenum as well as within the intrahepatic bile ducts. Both drains were secured to skin with silk suture and connected to bag. IMPRESSION: 1. Successful brush biopsy of the stenotic segments of the  common bile and right main bile ducts. 2. Successful replacement of 12 French right hepatic internal external biliary drain. 3. Successful insertion of 10 French left hepatic internal external biliary drain. Electronically Signed   By: Aliene Lloyd M.D.   On: 02/07/2023 16:36   CT ABDOMEN W CONTRAST Result Date: 02/07/2023 CLINICAL DATA:  77 year old male with a history of biliary obstruction. The original ultrasound 12/26/2022 demonstrates evidence of acute cholecystitis, which was present on a follow-up MRI 12/29/2022. Following this a percutaneous cholecystostomy was placed 01/02/2023 and replaced 01/24/2023. Ongoing symptoms of jaundice and hyperbilirubinemia prompted a second MRI 01/24/2023. MR suggested cholangitis given some enhancement along the ductal system, undrained intrahepatic bile ducts. Internal external biliary drain was placed 01/31/2023. Images from the [facility] 01/31/2023 are suspicious for biliary tumor at the hilum (Klatskin tumor ), although a differential could include biliary cirrhosis or other chronic inflammatory changes. A follow-up CT 01/31/2023 and 02/02/2023 performed, with no radiopaque gallstones accounting for biliary obstruction. The patient has had ongoing hyperbilirubinemia which has plateaued at the level of the proximally 14 and remains symptomatic. He presents for this CT study as a planning study preoperatively 4 left-sided biliary drainage. EXAM: CT ABDOMEN WITH CONTRAST TECHNIQUE: Multidetector CT imaging of the abdomen was performed using the standard protocol following bolus administration of intravenous contrast. RADIATION DOSE REDUCTION: This  exam was performed according to the departmental dose-optimization program which includes automated exposure control, adjustment of the mA and/or kV according to patient size and/or use of iterative reconstruction technique. CONTRAST:  75mL OMNIPAQUE  IOHEXOL  350 MG/ML SOLN COMPARISON:  Ultrasound 12/26/2022, MR 12/29/2022, per coli 01/02/2023, MR 01/24/2023, CT 01/31/2023, CT 02/02/2023 PTC and drainage 01/31/2023 FINDINGS: Lower chest: No acute finding of the lower chest. Minimal atelectasis and trace fluid. Hepatobiliary: Left-sided intrahepatic biliary ductal dilatation. There is associated periportal edema. There are a few small mildly dilated intrahepatic ducts of the right liver, particularly of the segment 8. Percutaneous transhepatic internal/external biliary drain via right-sided approach remains adequately position with the radiopaque marker in the bile ducts and the term in a shin of the drain in the small bowel. Percutaneous cholecystostomy tube in place within the fundus of the gallbladder. No radiopaque stones. There are no radiopaque stones identified along the course of the internal/external biliary drain. Region of hypodensity/hypoenhancement at the confluence of the left and right biliary ducts, at the liver hilum on image 19 of series 3. This region measures 13 mm x 42 mm on the axial images. No cirrhotic changes Pancreas: Attenuation/enhancement of the pancreas within normal limits. Mild ductal dilatation of the pancreatic duct. Spleen: Unremarkable Adrenals/Urinary Tract: - Right adrenal gland:  Unremarkable - Left adrenal gland: Unremarkable. - Right kidney: No hydronephrosis, nephrolithiasis, inflammation, or ureteral dilation. No focal lesion. - Left Kidney: No hydronephrosis, nephrolithiasis, inflammation, or ureteral dilation. No focal lesion. Stomach/Bowel: - Stomach: Small hiatal hernia.  Otherwise unremarkable stomach. - Small bowel: Duodenal diverticulum, which was identified on prior  ERCP attempt. Internal/external biliary drain terminates in the duodenum. Visualized small bowel and colon unremarkable with no distension. Vascular/Lymphatic: Calcifications of the abdominal aorta. Mesenteric arteries and renal arteries patent. Other: None Musculoskeletal: Unchanged configuration of the visualized vertebral bodies including compression fracture of L1 favored to be chronic. No bony canal narrowing. IMPRESSION: Right-sided internal/external biliary drain in adequate position across the right-sided biliary ducts and into the duodenum. The majority of the right-sided ductal system is decompressed, with some mild residual ductal dilatation involving segment 8. Persistent left-sided intrahepatic biliary ductal  dilatation. Nonspecific focus of low attenuation/hypoenhancement at the confluence of the left and right-sided biliary ducts in the liver hilum. This may reflect focal edema, however, a Klatskin tumor cannot be excluded as etiology for the ductal dilatation. Percutaneous cholecystostomy in position in the gallbladder. Aortic Atherosclerosis (ICD10-I70.0). Electronically Signed   By: Ami Bellman D.O.   On: 02/07/2023 11:21    Amber Stalls MD 03/04/2023

## 2023-03-04 NOTE — Progress Notes (Signed)
 Patient ID: Samuel Mayo, male   DOB: 06-04-1945, 77 y.o.   MRN: [id number] Patient scheduled today for image guided biliary drain/gallbladder drain exchanges.  Patient was originally scheduled as an outpatient at [facility] for the above procedure on 1/3.  He was recently admitted to [facility] with concern for GI bleeding/ coffee-ground emesis.  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  Latest total bilirubin 15.5 up from 8.6 1-week ago.  Latest imaging studies have been reviewed by Dr. Luverne.  Above plans have been discussed with patient and daughter. Consent signed and in chart.

## 2023-03-04 NOTE — H&P (Addendum)
 History and Physical  Samuel Mayo FMW:[id number] DOB: Sep 16, 1945 DOA: 03/03/2023  PCP: Jordan, [name], MD   Chief Complaint: Coffee-ground emesis  HPI: Samuel Mayo is a 77 y.o. male with complex recent past medical history including atrial flutter on Eliquis , recent cholecystitis with cholecystostomy and multiple biliary drains placed, recent diagnosis of Klatskin tumor and multiple long hospital stays last discharged from [facility] 02/24/23 complicated by acute kidney injury and hyponatremia who was discharged to a subacute nursing facility and is now being admitted to the hospital with concern for upper GI bleed.  He was discharged from the hospital to subacute nursing facility, with drains in place, and plan for further outpatient oncology follow-up and potentially surgical consultation at [facility] for second opinion regarding possible surgical options for his Klatskin's tumor.  He tells me that overall he has been doing well at the facility, has been working with therapy, and as far as he knows the drains are doing what they should be.  Yesterday, he was documented to have 2 episodes of coffee-ground emesis.  Patient tells me that he only vomited once.  Denies any significant associated abdominal burning, reflux symptoms.  In the emergency department, he has been hemodynamically stable, blood pressure has been on the low side.  He is resting comfortably and has no complaints.  Lab work as noted below significant for stable hemoglobin this morning 9.2, creatinine 1.44, worsening LFTs.  Review of Systems: Please see HPI for pertinent positives and negatives. A complete 10 system review of systems are otherwise negative.  Past Medical History:  Diagnosis Date   AK (actinic keratosis)    Allergy    Chicken pox    Compression fracture of L1 lumbar vertebra (HCC)    Compression fracture of thoracic vertebra (HCC)    Hypertension    Lentigines    Follows with DR Culton (derma)    Seizures (HCC)    Past Surgical History:  Procedure Laterality Date   CARDIOVERSION N/A 05/14/2021   Procedure: CARDIOVERSION;  Surgeon: Kate Lonni CROME, MD;  Location: [facility] ENDOSCOPY;  Service: Cardiovascular;  Laterality: N/A;   CARDIOVERSION N/A 10/08/2021   Procedure: CARDIOVERSION;  Surgeon: Santo Stanly LABOR, MD;  Location: [facility] ENDOSCOPY;  Service: Cardiovascular;  Laterality: N/A;   COLONOSCOPY     ESOPHAGOGASTRODUODENOSCOPY N/A 01/29/2023   Procedure: ESOPHAGOGASTRODUODENOSCOPY (EGD);  Surgeon: Avram Lupita BRAVO, MD;  Location: [facility] ENDOSCOPY;  Service: Gastroenterology;  Laterality: N/A;   IR BILIARY DRAIN PLACEMENT WITH CHOLANGIOGRAM  01/31/2023   IR CHOLANGIOGRAM EXISTING TUBE  02/13/2023   IR ENDOLUMINAL BX OF BILIARY TREE  02/07/2023   IR ENDOLUMINAL BX OF BILIARY TREE  02/07/2023   IR EXCHANGE BILIARY DRAIN  01/24/2023   IR EXCHANGE BILIARY DRAIN  02/07/2023   IR EXCHANGE BILIARY DRAIN  02/13/2023   IR EXCHANGE BILIARY DRAIN  02/13/2023   IR INT EXT BILIARY DRAIN WITH CHOLANGIOGRAM  02/07/2023   IR PERC CHOLECYSTOSTOMY  01/02/2023   mitral vavle     Congetinal mitral valve disease.   Social History:  reports that he has never smoked. He has never used smokeless tobacco. He reports current alcohol use of about 14.0 standard drinks of alcohol per week. He reports that he does not use drugs.  Allergies  Allergen Reactions   Other Other (See Comments)    Childhood reaction to Neosporin Ophthalmic ointment caused severe eye redness, irritation    Family History  Problem Relation Age of Onset   Heart disease Mother  Cancer Father    Heart disease Father    Prostate cancer Father    Aneurysm Paternal Grandfather    Heart disease Son        congenital mitral valve dz   Colon cancer Neg Hx    Esophageal cancer Neg Hx    Stomach cancer Neg Hx    Rectal cancer Neg Hx      Prior to Admission medications   Medication Sig Start Date End Date Taking? Authorizing  Provider  Cholecalciferol (VITAMIN D -3 PO) Take 1 capsule by mouth every evening.   Yes [provider]  diclofenac  Sodium (VOLTAREN ) 1 % GEL Apply 2 g topically 4 (four) times daily as needed (joint pain). 02/21/23  Yes Samtani, Jai-Gurmukh, MD  divalproex  (DEPAKOTE ) 250 MG DR tablet TAKE 1 TABLET 2 TIMES DAILY IN ADDITION TO 500 MG TWICE A DAY (TOTAL OF 750 MG TWICE A DAY) 02/24/23  Yes Rosemarie Eather RAMAN, MD  ELIQUIS  5 MG TABS tablet TAKE 1 TABLET BY MOUTH TWICE A DAY 08/07/22  Yes Mealor, Augustus E, MD  LUTEIN PO Take 1 tablet by mouth every evening.   Yes [provider]  midodrine  (PROAMATINE ) 10 MG tablet Take 1 tablet (10 mg total) by mouth 3 (three) times daily with meals. 02/21/23  Yes Samtani, Jai-Gurmukh, MD  Multiple Vitamin (MULTIVITAMIN WITH MINERALS) TABS tablet Take 1 tablet by mouth every evening.   Yes [provider]  polyethylene glycol (MIRALAX  / GLYCOLAX ) 17 g packet Take 17 g by mouth daily as needed for mild constipation. 02/21/23  Yes Samtani, Jai-Gurmukh, MD  sodium chloride  flush 0.9 % SOLN injection 10 mLs by Intracatheter route as needed. Flush percutaneous cholecystostomy drain with 10 ml normal saline once daily. 02/21/23 03/23/23 Yes Samtani, Jai-Gurmukh, MD  traMADol  (ULTRAM ) 50 MG tablet Take 1 tablet (50 mg total) by mouth every 6 (six) hours as needed for moderate pain (pain score 4-6). 02/21/23  Yes Royal Sill, MD    Physical Exam: BP (!) 87/59   Pulse 81   Temp 98.1 F (36.7 C) (Oral)   Resp (!) 8   SpO2 96%  General:  Alert, oriented, calm, in no acute distress, thin gentleman appearing his stated age.  Pleasant and cooperative, and good historian. Eyes: EOMI, clear conjuctivae, white sclerea Neck: supple, no masses, trachea mildline  Cardiovascular: RRR, no murmurs or rubs, no peripheral edema  Respiratory: clear to auscultation bilaterally, no wheezes, no crackles  Abdomen: soft, nontender, nondistended, normal bowel  tones heard, he has cholecystostomy tube and multiple biliary drains with intact dressings Skin: dry, no rashes  Musculoskeletal: no joint effusions, normal range of motion  Psychiatric: appropriate affect, normal speech  Neurologic: extraocular muscles intact, clear speech, moving all extremities with intact sensorium         Labs on Admission:  Basic Metabolic Panel: Recent Labs  Lab 03/03/23 2136  NA 128*  K 3.7  CL 87*  CO2 23  GLUCOSE 113*  BUN 52*  CREATININE 1.44*  CALCIUM 8.6*   Liver Function Tests: Recent Labs  Lab 03/03/23 2136  AST 121*  ALT 99*  ALKPHOS 479*  BILITOT 15.5*  PROT 7.2  ALBUMIN  2.9*   Recent Labs  Lab 03/03/23 2136  LIPASE 95*   Recent Labs  Lab 03/03/23 2204  AMMONIA 27   CBC: Recent Labs  Lab 03/03/23 2136 03/04/23 0438  WBC 6.7  --   NEUTROABS 5.3  --   HGB 11.4* 9.2*  HCT 34.9*  26.1*  MCV 105.8*  --   PLT 213  --    Cardiac Enzymes: No results for input(s): CKTOTAL, CKMB, CKMBINDEX, TROPONINI in the last 168 hours. BNP (last 3 results) No results for input(s): BNP in the last 8760 hours.  ProBNP (last 3 results) No results for input(s): PROBNP in the last 8760 hours.  CBG: Recent Labs  Lab 02/28/23 0912  GLUCAP 94    Radiological Exams on Admission: CT ABDOMEN PELVIS W CONTRAST Result Date: 03/04/2023 CLINICAL DATA:  Vomiting.  New diagnosis of cholangiocarcinoma. EXAM: CT ABDOMEN AND PELVIS WITH CONTRAST TECHNIQUE: Multidetector CT imaging of the abdomen and pelvis was performed using the standard protocol following bolus administration of intravenous contrast. RADIATION DOSE REDUCTION: This exam was performed according to the departmental dose-optimization program which includes automated exposure control, adjustment of the mA and/or kV according to patient size and/or use of iterative reconstruction technique. CONTRAST:  OMNIPAQUE  IOHEXOL  300 MG/ML  SOLN COMPARISON:  PET CT 02/28/2023,  abdominopelvic CT 02/06/2023 FINDINGS: Lower chest: The heart is enlarged. Atelectasis in the lung bases. Trace left pleural thickening. Hepatobiliary: Indwelling internal/external biliary drains in the right left lobe of the liver. The internal Pigtails are in the duodenum. Only minimal intrahepatic biliary ductal dilatation above the level of the drains. No intrahepatic fluid collection. The central low-density lesion on prior CT is poorly defined on the current exam. Cholecystostomy tube which is coiled in the gallbladder fundus. Gallbladder is decompressed. Pancreas: Atrophic. No inflammation. Pancreatic duct is prominent at 4 mm. Spleen: Normal in size without focal abnormality. Adrenals/Urinary Tract: No adrenal nodule. No hydronephrosis. No renal calculi or suspicious renal lesion. Unremarkable urinary bladder. Stomach/Bowel: No abnormal gastric distension. There is mild wall thickening about the distal stomach series 3, image 27. No evidence of perforation. No small bowel obstruction or inflammatory change. Normal appendix. Moderate colonic stool burden. Descending and sigmoid diverticulosis without focal diverticulitis. Vascular/Lymphatic: Aortic atherosclerosis and tortuosity. No aneurysm. No thrombus in the main portal vein. The left intrahepatic portal vein is attenuated. No enlarged lymph nodes in the abdomen or pelvis. Reproductive: Enlarged prostate gland spans 5.3 cm transverse. Other: Trace free fluid adjacent to the right lobe of the liver. No free air. No drainable fluid collection. Musculoskeletal: Chronic hand unchanged compression fractures of T11, L1, and L5. No acute osseous findings. IMPRESSION: 1. Mild wall thickening about the distal stomach, may represent gastritis or peptic ulcer disease. 2. Indwelling internal/external biliary drains in the right and left lobe of the liver. Only minimal intrahepatic biliary ductal dilatation above the level of the drains. The central low-density lesion  on prior CT is poorly defined on the current exam. 3. Cholecystostomy tube is coiled in the gallbladder fundus. Gallbladder is decompressed. 4. Colonic diverticulosis without diverticulitis. 5. Enlarged prostate gland. Aortic Atherosclerosis (ICD10-I70.0). Electronically Signed   By: Andrea Gasman M.D.   On: 03/04/2023 01:12   CT Head Wo Contrast Result Date: 03/04/2023 CLINICAL DATA:  Headache EXAM: CT HEAD WITHOUT CONTRAST TECHNIQUE: Contiguous axial images were obtained from the base of the skull through the vertex without intravenous contrast. RADIATION DOSE REDUCTION: This exam was performed according to the departmental dose-optimization program which includes automated exposure control, adjustment of the mA and/or kV according to patient size and/or use of iterative reconstruction technique. COMPARISON:  04/14/2020 FINDINGS: Brain: There is no mass, hemorrhage or extra-axial collection. The size and configuration of the ventricles and extra-axial CSF spaces are normal. There is hypoattenuation of the white matter, most  commonly indicating chronic small vessel disease. Vascular: No hyperdense vessel or unexpected vascular calcification. Skull: The visualized skull base, calvarium and extracranial soft tissues are normal. Sinuses/Orbits: No fluid levels or advanced mucosal thickening of the visualized paranasal sinuses. No mastoid or middle ear effusion. Normal orbits. Other: None. IMPRESSION: 1. No acute intracranial abnormality. 2. Chronic small vessel disease. Electronically Signed   By: Franky Stanford M.D.   On: 03/04/2023 00:35   DG Chest Port 1 View Result Date: 03/03/2023 CLINICAL DATA:  Recent emesis, initial encounter EXAM: PORTABLE CHEST 1 VIEW COMPARISON:  02/02/2023 FINDINGS: Cardiac shadow is enlarged. Postsurgical changes are again noted. Lungs are well aerated bilaterally. Old rib fractures are noted on the right. No acute abnormality seen. IMPRESSION: No active disease. Electronically  Signed   By: Oneil Devonshire M.D.   On: 03/03/2023 23:46   Assessment/Plan Samuel Mayo is a 77 y.o. male with complex recent past medical history including atrial flutter on Eliquis , recent cholecystitis with cholecystostomy and multiple biliary drains placed, recent diagnosis of Klatskin tumor and multiple long hospital stays last discharged from [facility] 02/24/23 complicated by acute kidney injury and hyponatremia who was discharged to a subacute nursing facility and is now being admitted to the hospital with concern for upper GI bleed.  Upper GI bleed-concern due to coffee-ground emesis, in the setting of Eliquis .  Last dose of Eliquis  was last evening 12/30.  Denies prior history of GI bleeding. -Inpatient admission -Monitor on telemetry -N.p.o. for now, except ice chips and meds -IV PPI -Continue to trend hemoglobin every 6 hours x 4 -Inpatient GI consult, discussed with [facility]  Seizure disorder-continue Depakote  750 mg p.o. every 12 hours  Acute kidney injury-in the setting of baseline normal renal function.  Possibly due to prerenal azotemia from volume loss due to vomiting. -Gentle IV fluids 500 cc -Encourage p.o. intake/fluid intake once okay with GI -Avoid nephrotoxins  Klatskin's tumor with biliary obstruction-continue biliary drain care.  His primary oncologist is Dr. Lanny, he had recent PET scan on 02/28/2023 which unfortunately shows multifocal osseous metastasis in the thoracic and cervical spine -PET scan findings discussed with family at the request -Outpatient follow-up with Dr. Lanny as previously scheduled later this week -Per family request, have consulted palliative care to discuss hospice  Orthostatic hypotension-on previous admission, was taken off of his metoprolol  XL, and amlodipine .  Has been maintained on midodrine  10 mg p.o. 3 times daily, which we will continue.  Hyponatremia-this is chronic and stable, continue to follow with daily labs  Abnormal  LFTs, with rising bilirubin-presumably due to his known cholangiocarcinoma, per family patient is scheduled for biliary drain exchange later this week -IR consult to consider stent exchange -Trend LFTs  DVT prophylaxis: SCDs only    Code Status: Limited: Do not attempt resuscitation (DNR) -DNR-LIMITED -Do Not Intubate/DNI   Consults called: [facility]  Admission status: The appropriate patient status for this patient is INPATIENT. Inpatient status is judged to be reasonable and necessary in order to provide the required intensity of service to ensure the patient's safety. The patient's presenting symptoms, physical exam findings, and initial radiographic and laboratory data in the context of their chronic comorbidities is felt to place them at high risk for further clinical deterioration. Furthermore, it is not anticipated that the patient will be medically stable for discharge from the hospital within 2 midnights of admission.    I certify that at the point of admission it is my clinical judgment that  the patient will require inpatient hospital care spanning beyond 2 midnights from the point of admission due to high intensity of service, high risk for further deterioration and high frequency of surveillance required   Time spent: 49 minutes  Samuel Mayo CHRISTELLA Gail MD [facility] Pager [phone]  If 7PM-7AM, please contact night-coverage [email or web address] Password [email or web address]  03/04/2023, 7:33 AM

## 2023-03-05 DIAGNOSIS — Z7189 Other specified counseling: Secondary | ICD-10-CM

## 2023-03-05 DIAGNOSIS — Z711 Person with feared health complaint in whom no diagnosis is made: Secondary | ICD-10-CM

## 2023-03-05 DIAGNOSIS — Z66 Do not resuscitate: Secondary | ICD-10-CM

## 2023-03-05 DIAGNOSIS — G893 Neoplasm related pain (acute) (chronic): Secondary | ICD-10-CM

## 2023-03-05 DIAGNOSIS — Z515 Encounter for palliative care: Secondary | ICD-10-CM | POA: Diagnosis not present

## 2023-03-05 DIAGNOSIS — K920 Hematemesis: Secondary | ICD-10-CM | POA: Diagnosis not present

## 2023-03-05 DIAGNOSIS — R4589 Other symptoms and signs involving emotional state: Secondary | ICD-10-CM

## 2023-03-05 DIAGNOSIS — C240 Malignant neoplasm of extrahepatic bile duct: Secondary | ICD-10-CM | POA: Diagnosis not present

## 2023-03-05 LAB — COMPREHENSIVE METABOLIC PANEL
ALT: 74 U/L — ABNORMAL HIGH (ref 0–44)
AST: 91 U/L — ABNORMAL HIGH (ref 15–41)
Albumin: 2.2 g/dL — ABNORMAL LOW (ref 3.5–5.0)
Alkaline Phosphatase: 388 U/L — ABNORMAL HIGH (ref 38–126)
Anion gap: 10 (ref 5–15)
BUN: 37 mg/dL — ABNORMAL HIGH (ref 8–23)
CO2: 23 mmol/L (ref 22–32)
Calcium: 8 mg/dL — ABNORMAL LOW (ref 8.9–10.3)
Chloride: 97 mmol/L — ABNORMAL LOW (ref 98–111)
Creatinine, Ser: 0.98 mg/dL (ref 0.61–1.24)
GFR, Estimated: >60 mL/min (ref >60)
Glucose, Bld: 86 mg/dL (ref 70–99)
Potassium: 4.4 mmol/L (ref 3.5–5.1)
Sodium: 130 mmol/L — ABNORMAL LOW (ref 135–145)
Total Bilirubin: 14.7 mg/dL — ABNORMAL HIGH (ref 0.0–1.2)
Total Protein: 5.5 g/dL — ABNORMAL LOW (ref 6.5–8.1)

## 2023-03-05 LAB — CBC
HCT: 31.3 % — ABNORMAL LOW (ref 39.0–52.0)
Hemoglobin: 10.6 g/dL — ABNORMAL LOW (ref 13.0–17.0)
MCH: 36.3 pg — ABNORMAL HIGH (ref 26.0–34.0)
MCHC: 33.9 g/dL (ref 30.0–36.0)
MCV: 107.2 fL — ABNORMAL HIGH (ref 80.0–100.0)
Platelets: 142 10*3/uL — ABNORMAL LOW (ref 150–400)
RBC: 2.92 MIL/uL — ABNORMAL LOW (ref 4.22–5.81)
RDW: 15.1 % (ref 11.5–15.5)
WBC: 8.6 10*3/uL (ref 4.0–10.5)
nRBC: 0 % (ref 0.0–0.2)

## 2023-03-05 MED ORDER — GLYCOPYRROLATE 0.2 MG/ML IJ SOLN: 0.2000 mg | Freq: Four times a day (QID) | INTRAMUSCULAR | Status: DC | PRN

## 2023-03-05 MED ORDER — LORAZEPAM 2 MG/ML IJ SOLN: 0.5000 mg | INTRAMUSCULAR | Status: DC | PRN

## 2023-03-05 MED ORDER — MORPHINE SULFATE (PF) 2 MG/ML IV SOLN
1.0000 mg | INTRAVENOUS | Status: DC | PRN
  Administered 2023-03-05 – 2023-03-07 (×4): 1 mg via INTRAVENOUS
  Filled 2023-03-05 (×5): qty 1

## 2023-03-05 NOTE — TOC Initial Note (Addendum)
 Transition of Care [facility]) - Initial/Assessment Note    Patient Details  Name: Samuel Mayo MRN: [id number] Date of Birth: October 09, 1945  [facility] CM/SW Contact:    Tawni CHRISTELLA Eva, LCSW Phone Number: 03/05/2023, 12:16 PM  Clinical Narrative:                 CSW spoke with the pt's daughter to confirm the hospice agency choice. A residential hospice referral was made to [place], hospice liaison for [facility]. TOC to follow   Adden 3:30 pm  Per [facility] no available bed at [facility] place today will reassess in the tomorrow. TOC to follow for d/c needs.   Expected Discharge Plan:  (TBD) Barriers to Discharge: Continued Medical Work up   Patient Goals and CMS Choice            Expected Discharge Plan and Services                                              Prior Living Arrangements/Services                       Activities of Daily Living   ADL Screening (condition at time of admission) Independently performs ADLs?: No Does the patient have a NEW difficulty with bathing/dressing/toileting/self-feeding that is expected to last >3 days?: No Does the patient have a NEW difficulty with getting in/out of bed, walking, or climbing stairs that is expected to last >3 days?: No Does the patient have a NEW difficulty with communication that is expected to last >3 days?: No Is the patient deaf or have difficulty hearing?: No Does the patient have difficulty seeing, even when wearing glasses/contacts?: Yes Does the patient have difficulty concentrating, remembering, or making decisions?: No  Permission Sought/Granted                  Emotional Assessment              Admission diagnosis:  Coffee ground emesis [K92.0] Nausea and vomiting, unspecified vomiting type [R11.2] Patient Active Problem List   Diagnosis Date Noted   Coffee ground emesis 03/04/2023   Metastatic malignant neoplasm (HCC) 03/04/2023   Klatskin's  tumor (HCC) 03/04/2023   Counseling and coordination of care 03/04/2023   Goals of care, counseling/discussion 03/04/2023   Palliative care encounter 03/04/2023   Cholangiocarcinoma (HCC) 02/18/2023   Palliative care by specialist 02/05/2023   Weakness generalized 02/05/2023   Hyperbilirubinemia 02/02/2023   DNR (do not resuscitate) 02/02/2023   DNR (do not resuscitate) discussion 02/02/2023   Malnutrition of moderate degree 01/31/2023   Acute biliary pancreatitis 01/27/2023   Cholecystitis 01/25/2023   Obstructive jaundice 01/24/2023   Acute cholecystitis 12/30/2022   Routine general medical examination at a health care facility 05/07/2022   Seizure (HCC) 04/26/2022   Secondary hypercoagulable state (HCC) 10/29/2021   Atrial fibrillation, chronic (HCC)    Gout, arthritis- R MTP joint. 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 [place] [address] Phone: [phone] Fax: [phone]  [facility] [address] [place]  [address] Phone: [phone] Fax: [phone]     Social Drivers of Health (SDOH) Social History: SDOH Screenings   Food Insecurity: No Food Insecurity (03/04/2023)  Housing: Low Risk  (03/04/2023)  Transportation Needs: No Transportation Needs (03/04/2023)  Utilities: Not At Risk (03/04/2023)  Alcohol Screen: Low Risk  (10/21/2022)  Depression (PHQ2-9): High Risk (01/13/2023)  Financial Resource Strain: Low Risk  (10/21/2022)  Physical Activity: Insufficiently Active (10/21/2022)  Social Connections: Moderately Isolated (03/04/2023)  Stress: Stress Concern Present (10/21/2022)  Tobacco Use: Low Risk  (03/03/2023)   SDOH Interventions:      Readmission Risk Interventions     No data to display

## 2023-03-05 NOTE — Progress Notes (Signed)
 PROGRESS NOTE  Samuel Mayo FMW:[id number] DOB: [birth year]   PCP: Jordan, [name], MD  Patient is from: SNF  DOA: 03/03/2023 LOS: 1  Chief complaints Chief Complaint  Patient presents with   Emesis     Brief Narrative / Interim history: 78 year old M with PMH of recent cholecystitis with perc chole, internal and external biliary drain, recent diagnosis of healer cholangiocarcinoma/Klatskin tumor with metastasis, recent Pseudomonas bacteremia, A-fib/flutter on Eliquis , HTN, osteoporosis, mitral valve annuloplasty and seizure disorder returning from SNF with concern for coffee-ground hematemesis, and admitted with working diagnosis of upper GI bleed, AKI and elevated LFT.  Given his complex past medical history, GI, oncology, IR and palliative medicine were consulted.  Patient has been evaluated by GI, oncology, IR and palliative medicine.  He had drain exchanged by IR on 12/31.  Given his rapid decline, it was recommended to pursue hospice/comfort care.  Family interested in residential hospice placement at [facility].  TOC consulted for referral.  Palliative medicine following.  Subjective: Seen and examined earlier this morning.  No major events overnight of this morning.  Sitting on bedside chair.  Patient's wife, son and daughter at bedside.  Rates his pain 5/10.  No other complaints.  Understands goal of care and plan pretty much.   Objective: Vitals:   03/05/23 0900 03/05/23 1000 03/05/23 1100 03/05/23 1137  BP: 96/64 (!) 102/55 97/69   Pulse: 98 88 (!) 111 (!) 102  Resp: 13 16 13 16   Temp:      TempSrc:      SpO2: 98% 94% 98% 97%  Weight:      Height:        Examination:  GENERAL: No apparent distress.  Nontoxic. HEENT: MMM.  Vision and hearing grossly intact.  Sclerae icteric. NECK: Supple.  No apparent JVD.  RESP:  No IWOB.  Fair aeration bilaterally. CVS:  RRR. Heart sounds normal.  ABD/GI/GU: BS+. Abd soft, NTND.  Abdominal drains in place. MSK/EXT:   Moves extremities. No apparent deformity. No edema.  SKIN: Skin jaundice. NEURO: Awake, alert and oriented appropriately.  No apparent focal neuro deficit. PSYCH: Calm. Normal affect.   Procedures:  12/31-exchange of percutaneous cholecystostomy, right and left percutaneous internal/external biliary drains  Microbiology summarized: MRSA PCR screen nonreactive  Assessment and plan: End-of-life care/full comfort care-patient with rapid decline.  Evaluated by oncology who recommended hospice/comfort care.  Patient and family on board and interested in residential hospice placement. -Changed CODE STATUS to DNR-comfort -Appreciate guidance by palliative care -TOC consulted for residential hospice placement  Cholangiocarcinoma/Klatskin's tumor with biliary obstruction: Followed by  Dr. Lanny.  Recent PET on 12/27 with multifocal osseous metastasis in the thoracic and cervical spine -S/p exchange of percutaneous cholecystostomy, right and left percutaneous internal/external biliary drains -On call oncology recommended hospice care.  Suspected upper GI bleed-concern due to coffee-ground emesis, in the setting of Eliquis .  Last dose of Eliquis  was the evening of 12/30.  Denies prior history of GI bleeding.  H&H stable. -Continue Protonix  -Appreciate input by GI  Elevated LFTs/hyperbilirubinemia: Likely due to the above -Management as above.  Acute kidney injury: Cr 1.44 (baseline 0.9-1.0) likely prerenal.  Resolved. -Avoid nephrotoxic meds  Orthostatic hypotension: Chronic.  On midodrine  at home. -Continue home midodrine   Seizure disorder -Continue Depakote  750 mg p.o. every 12 hours   Chronic hyponatremia: Stable   Body mass index is 22.28 kg/m.    DVT prophylaxis:  SCDs Start: 03/04/23 9266  Code Status: DNR-comfort Family Communication: Updated  patient's wife, son and daughter at bedside Level of care: Med-Surg Status is: Inpatient Remains inpatient appropriate because:  Residential hospice   Final disposition: Residential hospice Consultants:  Oncology Interventional radiology Gastroenterology Palliative medicine  55 minutes with more than 50% spent in reviewing records, counseling patient/family and coordinating care.   Sch Meds:  Scheduled Meds:  Chlorhexidine  Gluconate Cloth  6 each Topical Daily   divalproex   750 mg Oral Q12H   midodrine   10 mg Oral TID WC   pantoprazole  (PROTONIX ) IV  40 mg Intravenous BID   Continuous Infusions: PRN Meds:.albuterol , morphine  injection, ondansetron  **OR** ondansetron  (ZOFRAN ) IV, mouth rinse, polyethylene glycol, traMADol , traZODone   Antimicrobials: Anti-infectives (From admission, onward)    None        I have personally reviewed the following labs and images: CBC: Recent Labs  Lab 03/03/23 2136 03/04/23 0438 03/04/23 1058 03/04/23 1651 03/04/23 2258 03/05/23 0304  WBC 6.7  --   --   --   --  8.6  NEUTROABS 5.3  --   --   --   --   --   HGB 11.4* 9.2* 10.2* 10.9* 10.6* 10.6*  HCT 34.9* 26.1* 31.8* 34.0* 31.0* 31.3*  MCV 105.8*  --   --   --   --  107.2*  PLT 213  --   --   --   --  142*   BMP &GFR Recent Labs  Lab 03/03/23 2136 03/05/23 0304  NA 128* 130*  K 3.7 4.4  CL 87* 97*  CO2 23 23  GLUCOSE 113* 86  BUN 52* 37*  CREATININE 1.44* 0.98  CALCIUM 8.6* 8.0*   Estimated Creatinine Clearance: 64.4 mL/min (by C-G formula based on SCr of 0.98 mg/dL). Liver & Pancreas: Recent Labs  Lab 03/03/23 2136 03/05/23 0304  AST 121* 91*  ALT 99* 74*  ALKPHOS 479* 388*  BILITOT 15.5* 14.7*  PROT 7.2 5.5*  ALBUMIN  2.9* 2.2*   Recent Labs  Lab 03/03/23 2136  LIPASE 95*   Recent Labs  Lab 03/03/23 2204  AMMONIA 27   Diabetic: No results for input(s): HGBA1C in the last 72 hours. Recent Labs  Lab 02/28/23 0912  GLUCAP 94   Cardiac Enzymes: No results for input(s): CKTOTAL, CKMB, CKMBINDEX, TROPONINI in the last 168 hours. No results for input(s): PROBNP  in the last 8760 hours. Coagulation Profile: No results for input(s): INR, PROTIME in the last 168 hours. Thyroid  Function Tests: No results for input(s): TSH, T4TOTAL, FREET4, T3FREE, THYROIDAB in the last 72 hours. Lipid Profile: No results for input(s): CHOL, HDL, LDLCALC, TRIG, CHOLHDL, LDLDIRECT in the last 72 hours. Anemia Panel: No results for input(s): VITAMINB12, FOLATE, FERRITIN, TIBC, IRON, RETICCTPCT in the last 72 hours. Urine analysis:    Component Value Date/Time   COLORURINE AMBER (A) 03/04/2023 0202   APPEARANCEUR CLEAR 03/04/2023 0202   APPEARANCEUR Hazy (A) 03/27/2020 1413   LABSPEC 1.040 (H) 03/04/2023 0202   LABSPEC 1.015 02/03/2012 1915   PHURINE 5.0 03/04/2023 0202   GLUCOSEU NEGATIVE 03/04/2023 0202   GLUCOSEU NEGATIVE 12/23/2022 1055   HGBUR NEGATIVE 03/04/2023 0202   BILIRUBINUR MODERATE (A) 03/04/2023 0202   BILIRUBINUR Negative 03/27/2020 1413   BILIRUBINUR Negative 02/03/2012 1915   KETONESUR NEGATIVE 03/04/2023 0202   PROTEINUR 30 (A) 03/04/2023 0202   UROBILINOGEN 2.0 (A) 12/23/2022 1055   NITRITE NEGATIVE 03/04/2023 0202   LEUKOCYTESUR NEGATIVE 03/04/2023 0202   LEUKOCYTESUR Negative 02/03/2012 1915   Sepsis Labs: Invalid input(s): PROCALCITONIN, LACTICIDVEN  Microbiology: Recent Results (from the past 240 hours)  MRSA Next Gen by PCR, Nasal     Status: None   Collection Time: 03/04/23  8:04 AM   Specimen: Nasal Mucosa; Nasal Swab  Result Value Ref Range Status   MRSA by PCR Next Gen NOT DETECTED NOT DETECTED Final    Comment: (NOTE) The GeneXpert MRSA Assay (FDA approved for NASAL specimens only), is one component of a comprehensive MRSA colonization surveillance program. It is not intended to diagnose MRSA infection nor to guide or monitor treatment for MRSA infections. Test performance is not FDA approved in patients less than 36 years old. Performed at [facility], [address] [place], [place] [address]     Radiology Studies: IR EXCHANGE BILIARY DRAIN Result Date: 03/04/2023 INDICATION: Cholangiocarcinoma, biliary obstruction and rising bilirubin level with indwelling percutaneous cholecystostomy tube, right-sided percutaneous internal/external biliary drainage catheter and left-sided percutaneous internal/external biliary drainage catheter. Due to significant rise in bilirubin and clinical jaundice, decision has been made to replace all of the percutaneous drains under fluoroscopy as the was scheduled for drain exchange soon. EXAM: 1. EXCHANGE OF PERCUTANEOUS CHOLECYSTOSTOMY TUBE WITH CHOLANGIOGRAM UNDER FLUOROSCOPY 2. EXCHANGE OF RIGHT LOBE PERCUTANEOUS INTERNAL/EXTERNAL BILIARY DRAINAGE CATHETER WITH CHOLANGIOGRAM UNDER FLUOROSCOPY 3. EXCHANGE OF LEFT LOBE PERCUTANEOUS INTERNAL/EXTERNAL BILIARY DRAINAGE CATHETER WITH CHOLANGIOGRAM UNDER FLUOROSCOPY MEDICATIONS: None ANESTHESIA/SEDATION: None FLUOROSCOPY: Radiation Exposure Index (as provided by the fluoroscopic device): 29 mGy Kerma CONTRAST:  30 mL Omnipaque  300 COMPLICATIONS: None immediate. PROCEDURE: Informed written consent was obtained from the patient after a thorough discussion of the procedural risks, benefits and alternatives. All questions were addressed. Maximal Sterile Barrier Technique was utilized including caps, mask, sterile gowns, sterile gloves, sterile drape, hand hygiene and skin antiseptic. A timeout was performed prior to the initiation of the procedure. A 10 French percutaneous cholecystostomy tube was injected with contrast material under fluoroscopy with images saved. The catheter was removed over a guidewire and exchanged for a new 10 French catheter which was formed in the gallbladder lumen. A 14 French right lobe percutaneous internal/external biliary drainage catheter was injected with contrast material with cholangiogram images saved. The drain was then cut and removed over a guidewire.  This was exchanged for a new 14 French catheter which was advanced to the level of the duodenum and formed. This was injected with contrast material to confirm position. A 12 French left lobe percutaneous internal/external biliary drainage catheter was injected with contrast material with cholangiogram images saved. The drain was then cut and removed over a guidewire. This was exchanged for a new 12 French catheter which was advanced to the level of the duodenum and formed. This was injected with contrast material to confirm position. All 3 biliary drainage catheters were secured at the skin with Prolene retention sutures and attached to gravity drainage bags. FINDINGS: The gallbladder is decompressed. Contrast injection shows persistent essentially occluded cystic duct outflow. There may be a trace amount of contrast entering the common bile duct. Cholangiogram via the right internal/external biliary drainage catheter demonstrates decompressed bile ducts and appropriate tube positioning with the distal catheter formed in the duodenum. The tube contained some debris but was not occluded. A new drainage catheter was placed in a similar position and is draining well after exchange. Cholangiogram via the left internal/external biliary drainage catheter demonstrates decompressed left-sided bile ducts with appropriate positioning and the distal catheter formed in the duodenum. After catheter removal, the locking retention suture from the pre-existing drain was retained  in the body and removed after advancing a 3 French micropuncture dilator over one limb of the retention suture and applying tension to the suture. The entire retention suture was able to be removed. A new drainage catheter was placed in a similar position and is draining well after exchange. IMPRESSION: Exchange of 10 French percutaneous cholecystostomy, 83 French right lobe internal/external biliary drainage and 12 French left lobe internal/external  biliary drainage catheters under fluoroscopy as above. All catheters were attached to gravity bag drainage. Electronically Signed   By: Marcey Moan M.D.   On: 03/04/2023 16:56   IR EXCHANGE BILIARY DRAIN Result Date: 03/04/2023 INDICATION: Cholangiocarcinoma, biliary obstruction and rising bilirubin level with indwelling percutaneous cholecystostomy tube, right-sided percutaneous internal/external biliary drainage catheter and left-sided percutaneous internal/external biliary drainage catheter. Due to significant rise in bilirubin and clinical jaundice, decision has been made to replace all of the percutaneous drains under fluoroscopy as the was scheduled for drain exchange soon. EXAM: 1. EXCHANGE OF PERCUTANEOUS CHOLECYSTOSTOMY TUBE WITH CHOLANGIOGRAM UNDER FLUOROSCOPY 2. EXCHANGE OF RIGHT LOBE PERCUTANEOUS INTERNAL/EXTERNAL BILIARY DRAINAGE CATHETER WITH CHOLANGIOGRAM UNDER FLUOROSCOPY 3. EXCHANGE OF LEFT LOBE PERCUTANEOUS INTERNAL/EXTERNAL BILIARY DRAINAGE CATHETER WITH CHOLANGIOGRAM UNDER FLUOROSCOPY MEDICATIONS: None ANESTHESIA/SEDATION: None FLUOROSCOPY: Radiation Exposure Index (as provided by the fluoroscopic device): 29 mGy Kerma CONTRAST:  30 mL Omnipaque  300 COMPLICATIONS: None immediate. PROCEDURE: Informed written consent was obtained from the patient after a thorough discussion of the procedural risks, benefits and alternatives. All questions were addressed. Maximal Sterile Barrier Technique was utilized including caps, mask, sterile gowns, sterile gloves, sterile drape, hand hygiene and skin antiseptic. A timeout was performed prior to the initiation of the procedure. A 10 French percutaneous cholecystostomy tube was injected with contrast material under fluoroscopy with images saved. The catheter was removed over a guidewire and exchanged for a new 10 French catheter which was formed in the gallbladder lumen. A 14 French right lobe percutaneous internal/external biliary drainage catheter was  injected with contrast material with cholangiogram images saved. The drain was then cut and removed over a guidewire. This was exchanged for a new 14 French catheter which was advanced to the level of the duodenum and formed. This was injected with contrast material to confirm position. A 12 French left lobe percutaneous internal/external biliary drainage catheter was injected with contrast material with cholangiogram images saved. The drain was then cut and removed over a guidewire. This was exchanged for a new 12 French catheter which was advanced to the level of the duodenum and formed. This was injected with contrast material to confirm position. All 3 biliary drainage catheters were secured at the skin with Prolene retention sutures and attached to gravity drainage bags. FINDINGS: The gallbladder is decompressed. Contrast injection shows persistent essentially occluded cystic duct outflow. There may be a trace amount of contrast entering the common bile duct. Cholangiogram via the right internal/external biliary drainage catheter demonstrates decompressed bile ducts and appropriate tube positioning with the distal catheter formed in the duodenum. The tube contained some debris but was not occluded. A new drainage catheter was placed in a similar position and is draining well after exchange. Cholangiogram via the left internal/external biliary drainage catheter demonstrates decompressed left-sided bile ducts with appropriate positioning and the distal catheter formed in the duodenum. After catheter removal, the locking retention suture from the pre-existing drain was retained in the body and removed after advancing a 3 French micropuncture dilator over one limb of the retention suture and applying tension to the suture. The entire  retention suture was able to be removed. A new drainage catheter was placed in a similar position and is draining well after exchange. IMPRESSION: Exchange of 10 French percutaneous  cholecystostomy, 87 French right lobe internal/external biliary drainage and 12 French left lobe internal/external biliary drainage catheters under fluoroscopy as above. All catheters were attached to gravity bag drainage. Electronically Signed   By: Marcey Moan M.D.   On: 03/04/2023 16:56   IR EXCHANGE BILIARY DRAIN Result Date: 03/04/2023 INDICATION: Cholangiocarcinoma, biliary obstruction and rising bilirubin level with indwelling percutaneous cholecystostomy tube, right-sided percutaneous internal/external biliary drainage catheter and left-sided percutaneous internal/external biliary drainage catheter. Due to significant rise in bilirubin and clinical jaundice, decision has been made to replace all of the percutaneous drains under fluoroscopy as the was scheduled for drain exchange soon. EXAM: 1. EXCHANGE OF PERCUTANEOUS CHOLECYSTOSTOMY TUBE WITH CHOLANGIOGRAM UNDER FLUOROSCOPY 2. EXCHANGE OF RIGHT LOBE PERCUTANEOUS INTERNAL/EXTERNAL BILIARY DRAINAGE CATHETER WITH CHOLANGIOGRAM UNDER FLUOROSCOPY 3. EXCHANGE OF LEFT LOBE PERCUTANEOUS INTERNAL/EXTERNAL BILIARY DRAINAGE CATHETER WITH CHOLANGIOGRAM UNDER FLUOROSCOPY MEDICATIONS: None ANESTHESIA/SEDATION: None FLUOROSCOPY: Radiation Exposure Index (as provided by the fluoroscopic device): 29 mGy Kerma CONTRAST:  30 mL Omnipaque  300 COMPLICATIONS: None immediate. PROCEDURE: Informed written consent was obtained from the patient after a thorough discussion of the procedural risks, benefits and alternatives. All questions were addressed. Maximal Sterile Barrier Technique was utilized including caps, mask, sterile gowns, sterile gloves, sterile drape, hand hygiene and skin antiseptic. A timeout was performed prior to the initiation of the procedure. A 10 French percutaneous cholecystostomy tube was injected with contrast material under fluoroscopy with images saved. The catheter was removed over a guidewire and exchanged for a new 10 French catheter which was  formed in the gallbladder lumen. A 14 French right lobe percutaneous internal/external biliary drainage catheter was injected with contrast material with cholangiogram images saved. The drain was then cut and removed over a guidewire. This was exchanged for a new 14 French catheter which was advanced to the level of the duodenum and formed. This was injected with contrast material to confirm position. A 12 French left lobe percutaneous internal/external biliary drainage catheter was injected with contrast material with cholangiogram images saved. The drain was then cut and removed over a guidewire. This was exchanged for a new 12 French catheter which was advanced to the level of the duodenum and formed. This was injected with contrast material to confirm position. All 3 biliary drainage catheters were secured at the skin with Prolene retention sutures and attached to gravity drainage bags. FINDINGS: The gallbladder is decompressed. Contrast injection shows persistent essentially occluded cystic duct outflow. There may be a trace amount of contrast entering the common bile duct. Cholangiogram via the right internal/external biliary drainage catheter demonstrates decompressed bile ducts and appropriate tube positioning with the distal catheter formed in the duodenum. The tube contained some debris but was not occluded. A new drainage catheter was placed in a similar position and is draining well after exchange. Cholangiogram via the left internal/external biliary drainage catheter demonstrates decompressed left-sided bile ducts with appropriate positioning and the distal catheter formed in the duodenum. After catheter removal, the locking retention suture from the pre-existing drain was retained in the body and removed after advancing a 3 French micropuncture dilator over one limb of the retention suture and applying tension to the suture. The entire retention suture was able to be removed. A new drainage catheter  was placed in a similar position and is draining well after exchange. IMPRESSION: Exchange of  10 French percutaneous cholecystostomy, 86 French right lobe internal/external biliary drainage and 12 French left lobe internal/external biliary drainage catheters under fluoroscopy as above. All catheters were attached to gravity bag drainage. Electronically Signed   By: Marcey Moan M.D.   On: 03/04/2023 16:56      Samuel Mayo [facility]  If 7PM-7AM, please contact night-coverage [email or web address] 03/05/2023, 12:38 PM

## 2023-03-05 NOTE — Progress Notes (Signed)
 Daily Progress Note   Patient Name: Samuel Mayo       Date: 03/05/2023 DOB: Nov 17, 1945  Age: 78 y.o. MRN#: [id number] Attending Physician: Kathrin Mignon DASEN, MD Primary Care Physician: Jordan, [name], MD Admit Date: 03/03/2023 Length of Stay: 1 day  Reason for Consultation/Follow-up: Establishing goals of care  Subjective:   CC: Patient feels more fatigued today. Following up regarding complex medical decision making.   Subjective:  Reviewed EMR prior to presenting to bedside. Discussed care with RN for updates as well. Patient and family met with oncologist yesterday; patient not appropraite for cancer directed therapies. Patient and family decided to transition to comfort focused care today and are asking for referral to [facility] for inpatient hospice management.  When presenting to bedside to see patient, no family currently present. RN noted daughter had recently left. Patient sleeping though will awaken. Patient agrees that his focus is comfort care knowing he is at the end of life. Discussed with now provide IV medications for symptom management to which patient agreed. Patient is awaiting outcome of eval for [facility] transfer. Discussed with continue comfort focused care at this time while remains in the [facility].   Then able to call patient's significant other/HCPOA, Samuel Mayo, to update. Discussed with discontinue oral medications because patient becoming more fatigued and it is becoming difficult to swallow. Will provide IV medications for symptom management. Samuel agrees plan is still for referral to [facility] for inpatient hospice care at the end of life. Samuel did inquire if they still needed to meet with Dr. Lanny tomorrow as scheduled in the outpatient setting; stated this did not need to occur since care is already determined. She acknowledged this. Spent time answering questions as able and proving support via active listening. Samuel Mayo for  speaking with me today.   Updated IDT regarding conversation.   Objective:   Vital Signs:  BP 102/68 (BP Location: Left Arm)   Pulse 88   Temp 97.8 F (36.6 C) (Oral)   Resp 14   Ht 6' (1.829 m)   Wt 74.5 kg   SpO2 98%   BMI 22.28 kg/m   Physical Exam: General: NAD, will awaken though fatigued, ill appearing, jaundiced, frail  Eyes: scleral icterus  Cardiovascular: RRR Respiratory: no increased work of breathing noted, not in respiratory distress  Imaging:  I personally reviewed recent imaging.   Assessment & Plan:   Assessment: Patient is 78 year old male with past medical history of atrial flutter on Eliquis , seizure disorder, recent cholecystitis with cholecystectomy and biliary drains, and recent diagnosis of Klatskin tumor with multiple long [facility] stays with most recent discharge from [facility] on 02/24/2023 after complicated hospitalization requiring management for AKI and hyponatremia who was admitted from subacute rehab on 03/03/2023 for management of upper GI bleed.  GI consulted for recommendations. Palliative medicine team consulted to assist with complex medical decision making.   Recommendations/Plan: # Complex medical decision making/goals of care:  -Discussed care with patient and HCPOA/significant other, Samuel, as detailed above in HPI. Patient is full comfort focused care at this time. Patient becoming more lethargic and so more difficult to swallow pills. Will provide IV medications for symptom management.   -Seeking [facility] placement at this time for inpatient hospice care at the end of life.   -Have discontinued interventions that are no longer focused on comfort such as IV fluids, imaging, or lab work.  Will instead focus on symptom management of pain, dyspnea, and agitation  in the setting of end-of-life care.  - Code Status: DNR   # Symptom management    -Pain/Dyspnea, acute in the setting of end-of-life care                Patient was not  on medications for pain previously.                               -Start IV morphine  1 mg q30 mins prn.  Continue to adjust based on patient's symptom burden.  If patient needing frequent dosing, may need to consider continuous infusion.                  -Anxiety/agitation, in the setting of end-of-life care                               -Start IV Ativan  0.5 mg every 4 hours as needed. Continue to adjust based on patient's symptom burden.                    -Secretions, in the setting of end-of-life care                               -Start IV glycopyrrolate  0.2 mg every 4 hours as needed. Continuing oral midodrine  as bridge to get to [employer]. If unable to swallow/refuses, will not force giving and can discontinue.   # Psychosocial Support:  -significant other/HCPOA, daughter, son   # Discharge Planning: Hospice facility -Possible in [facility] death if not able to transfer to [facility] or further deteriorates.   Discussed with: patient, patient's SO, RN, hospitalist, TOC, ACC liaison   Thank you for allowing the palliative care team to participate in the care Samuel Mayo.  Tinnie Radar, DO Palliative Care Provider PMT # [phone]  If patient remains symptomatic despite maximum doses, please call PMT at [phone] between 0700 and 1900. Outside of these hours, please call attending, as PMT does not have night coverage.  *Please note that this is a verbal dictation therefore any spelling or grammatical errors are due to the Dragon Medical One system interpretation.

## 2023-03-06 ENCOUNTER — Encounter: Payer: Self-pay | Admitting: Internal Medicine

## 2023-03-06 ENCOUNTER — Ambulatory Visit: Payer: Medicare Other | Admitting: Hematology

## 2023-03-06 ENCOUNTER — Inpatient Hospital Stay: Payer: Medicare Other

## 2023-03-06 DIAGNOSIS — Z711 Person with feared health complaint in whom no diagnosis is made: Secondary | ICD-10-CM

## 2023-03-06 DIAGNOSIS — R4589 Other symptoms and signs involving emotional state: Secondary | ICD-10-CM | POA: Diagnosis not present

## 2023-03-06 DIAGNOSIS — Z7189 Other specified counseling: Secondary | ICD-10-CM | POA: Diagnosis not present

## 2023-03-06 DIAGNOSIS — Z79899 Other long term (current) drug therapy: Secondary | ICD-10-CM

## 2023-03-06 DIAGNOSIS — Z515 Encounter for palliative care: Secondary | ICD-10-CM | POA: Diagnosis not present

## 2023-03-06 DIAGNOSIS — C240 Malignant neoplasm of extrahepatic bile duct: Secondary | ICD-10-CM | POA: Diagnosis not present

## 2023-03-06 DIAGNOSIS — G893 Neoplasm related pain (acute) (chronic): Secondary | ICD-10-CM

## 2023-03-06 MED ORDER — LORAZEPAM 2 MG/ML IJ SOLN: 0.5000 mg | INTRAMUSCULAR | Status: DC | PRN

## 2023-03-06 MED ORDER — MORPHINE SULFATE (PF) 2 MG/ML IV SOLN: 1.0000 mg | INTRAVENOUS | Status: DC | PRN

## 2023-03-06 MED ORDER — GLYCOPYRROLATE 0.2 MG/ML IJ SOLN: 0.2000 mg | Freq: Four times a day (QID) | INTRAMUSCULAR | Status: DC | PRN

## 2023-03-06 MED ORDER — ONDANSETRON HCL 4 MG/2ML IJ SOLN: 4.0000 mg | Freq: Four times a day (QID) | INTRAMUSCULAR | Status: DC | PRN

## 2023-03-06 NOTE — Progress Notes (Signed)
 Oncology brief follow-up note  Patient is well-known to me, I met him during his last hospital admission when he was diagnosed with cholangiocarcinoma.  I have reviewed his chart, and discussed the PET scan findings with patient and his son.  Patient is waiting for residential hospice placement at the [facility], patient appears to be comfortable, I spoke with both patient and his son at bedside and supported their decision.  I totally agree with hospice care, he is not a candidate for cancer treatment due to his rapid declining of liver function and performance status.  He is appropriate candidate for residential hospice.  Onita Mattock  03/06/2023

## 2023-03-06 NOTE — Plan of Care (Signed)
   Problem: Clinical Measurements: Goal: Respiratory complications will improve Outcome: Progressing   Problem: Clinical Measurements: Goal: Cardiovascular complication will be avoided Outcome: Progressing

## 2023-03-06 NOTE — Progress Notes (Signed)
 Daily Progress Note   Patient Name: Samuel [facility]       Date: 03/06/2023 DOB: [birth year]  Age: 78 y.o. MRN#: [id number] Attending Physician: Kathrin Mignon DASEN, MD Primary Care Physician: Jordan, [name], MD Admit Date: 03/03/2023 Length of Stay: 2 days  Reason for Consultation/Follow-up: Establishing goals of care  Subjective:   CC: Patient sleeping comfortably when seen. Following up regarding complex medical decision making.   Subjective:  Reviewed EMR prior to presenting to bedside.  At time of EMR review in past 24 hours patient has received IV morphine  prn x 3 doses.  When presenting to bedside to see patient, patient's significant other present.  Able to review patient's symptom care at this time.  Family still wanting patient to transfer to [facility] when bed available.  Spent time providing emotional support via active listening.  Noted palliative medicine team will continue to follow with patient's medical journey.  When presenting to bedside, patient was sleeping comfortably without other family members present so did not awaken.  Discussed care with RN regarding medication management.  Objective:   Vital Signs:  BP 121/87 (BP Location: Left Arm)   Pulse 95   Temp 97.9 F (36.6 C) (Oral)   Resp 18   Ht 6' (1.829 m)   Wt 74.5 kg   SpO2 100%   BMI 22.28 kg/m   Physical Exam: General: NAD, sleeping, ill appearing, jaundiced, frail  Cardiovascular: RRR Respiratory: no increased work of breathing noted, not in respiratory distress  Imaging:  I personally reviewed recent imaging.   Assessment & Plan:   Assessment: Patient is 78 year old male with past medical history of atrial flutter on Eliquis , seizure disorder, recent cholecystitis with cholecystectomy and biliary drains, and recent diagnosis of Klatskin tumor with multiple long hospital stays with most recent discharge from [facility] on 02/24/2023 after complicated hospitalization requiring management  for AKI and hyponatremia who was admitted from subacute rehab on 03/03/2023 for management of upper GI bleed.  GI consulted for recommendations. Palliative medicine team consulted to assist with complex medical decision making.   Recommendations/Plan: # Complex medical decision making/goals of care:  -Continuing comfort focused interventions at this time.  Awaiting acceptance and placement at [facility] for inpatient hospice management.  -Have discontinued interventions that are no longer focused on comfort such as IV fluids, imaging, or lab work.  Will instead focus on symptom management of pain, dyspnea, and agitation in the setting of end-of-life care.  - Code Status: DNR   # Symptom management    -Pain/Dyspnea, acute in the setting of end-of-life care                Patient was not on medications for pain previously.                               -Continue IV morphine  1 mg q30 mins prn.  Continue to adjust based on patient's symptom burden.  If patient needing frequent dosing, may need to consider continuous infusion.                  -Anxiety/agitation, in the setting of end-of-life care                               -Continue IV Ativan  0.5 mg every 4 hours as needed. Continue to adjust based on patient's symptom burden.                    -  Secretions, in the setting of end-of-life care                               -Continue IV glycopyrrolate  0.2 mg every 4 hours as needed.  Continuing oral midodrine  as bridge to get to [employer]. If unable to swallow/refuses, will not force giving and can discontinue.   # Psychosocial Support:  -significant other/HCPOA, daughter, son   # Discharge Planning: Hospice facility -Possible in hospital death if not able to transfer to [facility] or further deteriorates.   Discussed with: patient's SO, RN  Thank you for allowing the palliative care team to participate in the care [facility].  Tinnie Radar, DO Palliative Care  Provider PMT # [phone]  If patient remains symptomatic despite maximum doses, please call PMT at [phone] between 0700 and 1900. Outside of these hours, please call attending, as PMT does not have night coverage.  Personally spent 35 minutes in patient care including extensive chart review (labs, imaging, progress/consult notes, vital signs), medically appropraite exam, discussed with treatment team, education to patient, family, and staff, documenting clinical information, medication review and management, coordination of care, and available advanced directive documents.   *Please note that this is a verbal dictation therefore any spelling or grammatical errors are due to the Dragon Medical One system interpretation.

## 2023-03-06 NOTE — Progress Notes (Signed)
 PROGRESS NOTE  [name] FMW:[id number] DOB: December 05, 1945   PCP: Jordan, [name], MD  Patient is from: SNF  DOA: 03/03/2023 LOS: 2  Chief complaints Chief Complaint  Patient presents with   Emesis     Brief Narrative / Interim history: 78 year old M with PMH of recent cholecystitis with perc chole, internal and external biliary drain, recent diagnosis of healer cholangiocarcinoma/Klatskin tumor with metastasis, recent Pseudomonas bacteremia, A-fib/flutter on Eliquis , HTN, osteoporosis, mitral valve annuloplasty and seizure disorder returning from SNF with concern for coffee-ground hematemesis, and admitted with working diagnosis of upper GI bleed, AKI and elevated LFT.  Given his complex past medical history, GI, oncology, IR and palliative medicine were consulted.  Patient has been evaluated by GI, oncology, IR and palliative medicine.  He had drain exchanged by IR on 12/31.  Given his rapid decline, it was recommended to pursue hospice/comfort care.  Accepted to [facility] pending bed availability.  Palliative medicine following.  Subjective: Seen and examined earlier this morning.  Patient is sleeping quietly.  Patient's wife and other family member at bedside.  No concerns or questions.  Objective: Vitals:   03/05/23 2145 03/06/23 0800 03/06/23 1023 03/06/23 1258  BP:  105/69 98/70 122/70  Pulse: 95  90 83  Resp: 18   15  Temp: 97.9 F (36.6 C)   98 F (36.7 C)  TempSrc: Oral   Oral  SpO2: 100%   99%  Weight:      Height:        Examination:  GENERAL: Appears frail.  Sleeping. RESP:  No IWOB.  On room air. MSK/EXT:  Moves extremities. No apparent deformity.  SKIN: Skin jaundice. NEURO: Sleeping.  No apparent focal neurodeficit. PSYCH: Sleeping quietly.  No distress or agitation.  Procedures:  12/31-exchange of percutaneous cholecystostomy, right and left percutaneous internal/external biliary drains  Microbiology summarized: MRSA PCR screen  nonreactive  Assessment and plan: End-of-life care/full comfort care-patient with rapid decline.  Evaluated by oncology who recommended hospice/comfort care.  Patient and family on board and interested in residential hospice placement. -Changed CODE STATUS to DNR-comfort -Appreciate guidance by palliative care -Accepted to [facility] pending bed availability  Cholangiocarcinoma/Klatskin's tumor with biliary obstruction: Followed by  Dr. Lanny.  Recent PET on 12/27 with multifocal osseous metastasis in the thoracic and cervical spine -Percutaneous cholecystostomy, right and left percutaneous internal/external biliary drain exchange by IR on 12/31 -On call oncology recommended hospice care.  Suspected upper GI bleed-concern due to coffee-ground emesis, in the setting of Eliquis .  Last dose of Eliquis  was the evening of 12/30.  Denies prior history of GI bleeding.  H&H stable. -Appreciate input by GI-signed off -Continue Protonix  while in house  Elevated LFTs/hyperbilirubinemia: Likely due to the above -Management as above.  Acute kidney injury: Cr 1.44 (baseline 0.9-1.0) likely prerenal.  Resolved. -Avoid nephrotoxic meds  Orthostatic hypotension: Chronic.  On midodrine  at home. -Continue home midodrine  while in house  Seizure disorder -Continue Depakote  750 mg p.o. every 12 hours   Chronic hyponatremia: Stable   Body mass index is 22.28 kg/m.    DVT prophylaxis:  SCDs Start: 03/04/23 9266  Code Status: DNR-comfort Family Communication: Updated patient's wife at bedside. Level of care: Med-Surg Status is: Inpatient Remains inpatient appropriate because: Residential hospice   Final disposition: Residential hospice Consultants:  Oncology Interventional radiology Gastroenterology Palliative medicine  35 minutes with more than 50% spent in reviewing records, counseling patient/family and coordinating care.   Sch Meds:  Scheduled Meds:  midodrine   10 mg Oral TID WC    pantoprazole  (PROTONIX ) IV  40 mg Intravenous BID   Continuous Infusions: PRN Meds:.albuterol , glycopyrrolate , LORazepam , morphine  injection, [DISCONTINUED] ondansetron  **OR** ondansetron  (ZOFRAN ) IV, mouth rinse  Antimicrobials: Anti-infectives (From admission, onward)    None        I have personally reviewed the following labs and images: CBC: Recent Labs  Lab 03/03/23 2136 03/04/23 0438 03/04/23 1058 03/04/23 1651 03/04/23 2258 03/05/23 0304  WBC 6.7  --   --   --   --  8.6  NEUTROABS 5.3  --   --   --   --   --   HGB 11.4* 9.2* 10.2* 10.9* 10.6* 10.6*  HCT 34.9* 26.1* 31.8* 34.0* 31.0* 31.3*  MCV 105.8*  --   --   --   --  107.2*  PLT 213  --   --   --   --  142*   BMP &GFR Recent Labs  Lab 03/03/23 2136 03/05/23 0304  NA 128* 130*  K 3.7 4.4  CL 87* 97*  CO2 23 23  GLUCOSE 113* 86  BUN 52* 37*  CREATININE 1.44* 0.98  CALCIUM 8.6* 8.0*   Estimated Creatinine Clearance: 64.4 mL/min (by C-G formula based on SCr of 0.98 mg/dL). Liver & Pancreas: Recent Labs  Lab 03/03/23 2136 03/05/23 0304  AST 121* 91*  ALT 99* 74*  ALKPHOS 479* 388*  BILITOT 15.5* 14.7*  PROT 7.2 5.5*  ALBUMIN  2.9* 2.2*   Recent Labs  Lab 03/03/23 2136  LIPASE 95*   Recent Labs  Lab 03/03/23 2204  AMMONIA 27   Diabetic: No results for input(s): HGBA1C in the last 72 hours. Recent Labs  Lab 02/28/23 0912  GLUCAP 94   Cardiac Enzymes: No results for input(s): CKTOTAL, CKMB, CKMBINDEX, TROPONINI in the last 168 hours. No results for input(s): PROBNP in the last 8760 hours. Coagulation Profile: No results for input(s): INR, PROTIME in the last 168 hours. Thyroid  Function Tests: No results for input(s): TSH, T4TOTAL, FREET4, T3FREE, THYROIDAB in the last 72 hours. Lipid Profile: No results for input(s): CHOL, HDL, LDLCALC, TRIG, CHOLHDL, LDLDIRECT in the last 72 hours. Anemia Panel: No results for input(s): VITAMINB12,  FOLATE, FERRITIN, TIBC, IRON, RETICCTPCT in the last 72 hours. Urine analysis:    Component Value Date/Time   COLORURINE AMBER (A) 03/04/2023 0202   APPEARANCEUR CLEAR 03/04/2023 0202   APPEARANCEUR Hazy (A) 03/27/2020 1413   LABSPEC 1.040 (H) 03/04/2023 0202   LABSPEC 1.015 02/03/2012 1915   PHURINE 5.0 03/04/2023 0202   GLUCOSEU NEGATIVE 03/04/2023 0202   GLUCOSEU NEGATIVE 12/23/2022 1055   HGBUR NEGATIVE 03/04/2023 0202   BILIRUBINUR MODERATE (A) 03/04/2023 0202   BILIRUBINUR Negative 03/27/2020 1413   BILIRUBINUR Negative 02/03/2012 1915   KETONESUR NEGATIVE 03/04/2023 0202   PROTEINUR 30 (A) 03/04/2023 0202   UROBILINOGEN 2.0 (A) 12/23/2022 1055   NITRITE NEGATIVE 03/04/2023 0202   LEUKOCYTESUR NEGATIVE 03/04/2023 0202   LEUKOCYTESUR Negative 02/03/2012 1915   Sepsis Labs: Invalid input(s): PROCALCITONIN, LACTICIDVEN  Microbiology: Recent Results (from the past 240 hours)  MRSA Next Gen by PCR, Nasal     Status: None   Collection Time: 03/04/23  8:04 AM   Specimen: Nasal Mucosa; Nasal Swab  Result Value Ref Range Status   MRSA by PCR Next Gen NOT DETECTED NOT DETECTED Final    Comment: (NOTE) The GeneXpert MRSA Assay (FDA approved for NASAL specimens only), is one component of a comprehensive MRSA colonization  surveillance program. It is not intended to diagnose MRSA infection nor to guide or monitor treatment for MRSA infections. Test performance is not FDA approved in patients less than 39 years old. Performed at [facility], [address], [place], [place] [address]     Radiology Studies: No results found.     Marcele Kosta T. Chelsie Burel [facility]  If 7PM-7AM, please contact night-coverage [email or web address] 03/06/2023, 2:29 PM

## 2023-03-07 ENCOUNTER — Other Ambulatory Visit: Payer: Medicare Other

## 2023-03-07 ENCOUNTER — Ambulatory Visit: Payer: Medicare Other | Admitting: Hematology

## 2023-03-07 ENCOUNTER — Inpatient Hospital Stay (HOSPITAL_COMMUNITY): Admission: RE | Admit: 2023-03-07 | Payer: Medicare Other | Source: Ambulatory Visit

## 2023-03-07 DIAGNOSIS — G893 Neoplasm related pain (acute) (chronic): Secondary | ICD-10-CM | POA: Diagnosis not present

## 2023-03-07 DIAGNOSIS — Z79899 Other long term (current) drug therapy: Secondary | ICD-10-CM | POA: Diagnosis not present

## 2023-03-07 DIAGNOSIS — Z711 Person with feared health complaint in whom no diagnosis is made: Secondary | ICD-10-CM | POA: Diagnosis not present

## 2023-03-07 DIAGNOSIS — K920 Hematemesis: Secondary | ICD-10-CM | POA: Diagnosis not present

## 2023-03-07 NOTE — Progress Notes (Signed)
 [facility] Liaison Note  Received request from [facility] manager for family interest in [facility]. Eligibility confirmed. Spoke with caregiver to confirm interest and explain services. Family agreeable to transfer today. TOC aware. RN please call report to [phone] prior to patient leaving the unit. Please send signed DNR with patient at discharge.   Thank you for allowing us  to participate in this patient's care.  Eleanor Nail, LPN [facility] Liaison [phone]

## 2023-03-07 NOTE — Progress Notes (Signed)
 Patient discharged to [facility], discharge paperwork provided in discharge packet, IV removed, report called by RN and given to receiving facility, dressing changes completed prior to discharge, awaiting [facility] to transport patient.

## 2023-03-07 NOTE — Progress Notes (Signed)
[facility] arrived to transport patient. 

## 2023-03-07 NOTE — Plan of Care (Signed)
   Problem: Nutrition: Goal: Adequate nutrition will be maintained Outcome: Not Progressing

## 2023-03-07 NOTE — Progress Notes (Signed)
  Daily Progress Note   Patient Name: Samuel Mayo [facility]       Date: 03/07/2023 DOB: 06/15/1945  Age: 78 y.o. MRN#: [id number] Attending Physician: Davia Nydia POUR, MD Primary Care Physician: Jordan, [name], MD Admit Date: 03/03/2023 Length of Stay: 3 days  Discussed care with IDT today. Patient has been accepted to [facility] for inpatient hospice and a bed is available today. ACC liaison working to complete consents with HCPOA. Awaiting transfer when able. Patient has medications ordered for comfort. Please reach out if acute PMT needs arise. Thank you.    Tinnie Radar, DO Palliative Care Provider PMT # [phone]

## 2023-03-07 NOTE — Discharge Summary (Signed)
 Physician Discharge Summary   Patient: Samuel Mayo MRN: [id number] DOB: 01-17-1946  Admit date:     03/03/2023  Discharge date: 03/07/23  Discharge Physician: Nydia Distance, MD    PCP: Jordan, [name], MD   Recommendations at discharge:   Patient accepted to [facility]  Discharge Diagnoses:    Coffee ground emesis Cholangiocarcinoma with biliary obstruction (HCC)   Klatskin's tumor (HCC)   Cancer associated pain Suspected upper GI bleed Acute kidney injury Orthostatic hypotension Seizure disorder Chronic hyponatremia  Hospital Course: 78 year old M with PMH of recent cholecystitis with perc chole, internal and external biliary drain, recent diagnosis of healer cholangiocarcinoma/Klatskin tumor with metastasis, recent Pseudomonas bacteremia, A-fib/flutter on Eliquis , HTN, osteoporosis, mitral valve annuloplasty and seizure disorder returning from SNF with concern for coffee-ground hematemesis, and admitted with working diagnosis of upper GI bleed, AKI and elevated LFT.  Given his complex past medical history, GI, oncology, IR and palliative medicine were consulted.   Patient has been evaluated by GI, oncology, IR and palliative medicine.  He had drain exchanged by IR on 12/31.  Given his rapid decline, it was recommended to pursue hospice/comfort care.  Accepted to [facility].     Assessment and Plan:  Cholangiocarcinoma/Klatskin's tumor with biliary obstruction: Obstructive jaundice with metastatic disease per PET scan - Followed by  Dr. Lanny.  Recent PET on 12/27 with multifocal osseous metastasis in the thoracic and cervical spine -Percutaneous cholecystostomy, right and left percutaneous internal/external biliary drain exchange by IR on 12/31 -Seen by Dr Loretha, oncology inpatient and, given poor performance, rapidly progressing disease, recommended comfort care and hospice. -Has been accepted to [facility].  End-of-life care/full comfort care-patient with  rapid decline.   -Evaluated by oncology who recommended hospice/comfort care.  Patient and family on board and interested in residential hospice placement. -Appreciate palliative medicine recommendations, patient accepted to [facility]      Suspected upper GI bleed - concern due to coffee-ground emesis, in the setting of Eliquis .  Last dose of Eliquis  was the evening of 12/30.  Denied prior history of GI bleeding.  H&H stable. -Seen by gastroenterology, Dr. Charlanne, recommended to hold Eliquis , no invasive procedures at this time and continue with comfort care.    Elevated LFTs/hyperbilirubinemia: Likely due to the above -Management as above.   Acute kidney injury:  Cr 1.44 (baseline 0.9-1.0) likely prerenal.  Resolved.    Orthostatic hypotension:  Chronic.  On midodrine  at home. -Continue comfort care  Seizure disorder -Continue comfort care.   Chronic hyponatremia: Stable     Body mass index is 22.28 kg/m.     Pain control - [place]  Controlled Substance Reporting System database was reviewed. and patient was instructed, not to drive, operate heavy machinery, perform activities at heights, swimming or participation in water activities or provide baby-sitting services while on Pain, Sleep and Anxiety Medications; until their outpatient Physician has advised to do so again. Also recommended to not to take more than prescribed Pain, Sleep and Anxiety Medications.  Consultants: GI, palliative medicine, oncology, interventional neurology Procedures performed:   Disposition: Residential hospice Diet recommendation: Clear liquid diet  DISCHARGE MEDICATION: Allergies as of 03/07/2023       Reactions   Other Other (See Comments)   Childhood reaction to Neosporin Ophthalmic ointment caused severe eye redness, irritation        Medication List     STOP taking these medications    diclofenac  Sodium 1 % Gel Commonly known as: VOLTAREN   divalproex  250 MG DR  tablet Commonly known as: DEPAKOTE    Eliquis  5 MG Tabs tablet Generic drug: apixaban    LUTEIN PO   midodrine  10 MG tablet Commonly known as: PROAMATINE    multivitamin with minerals Tabs tablet   polyethylene glycol 17 g packet Commonly known as: MIRALAX  / GLYCOLAX    sodium chloride  flush 0.9 % Soln injection   traMADol  50 MG tablet Commonly known as: ULTRAM    VITAMIN D -3 PO       TAKE these medications    glycopyrrolate  0.2 MG/ML injection Commonly known as: ROBINUL  Inject 1 mL (0.2 mg total) into the vein every 6 (six) hours as needed (secretions).   LORazepam  2 MG/ML injection Commonly known as: ATIVAN  Inject 0.25 mLs (0.5 mg total) into the vein every 4 (four) hours as needed for anxiety or seizure (agitation).   morphine  (PF) 2 MG/ML injection Inject 0.5 mLs (1 mg total) into the vein every 30 (thirty) minutes as needed (dyspnea).   ondansetron  4 MG/2ML Soln injection Commonly known as: ZOFRAN  Inject 2 mLs (4 mg total) into the vein every 6 (six) hours as needed for nausea.        Follow-up Information     Jordan, [name], MD. Schedule an appointment as soon as possible for a visit.   Specialty: Family Medicine Why: as needed Contact information: [address] [place] [address] [phone]                Discharge Exam: Filed Weights   03/04/23 0817  Weight: 74.5 kg   S: Alert and awake, no acute complaints, partner at the bedside  BP 130/72   Pulse 92   Temp 98 F (36.7 C) (Oral)   Resp 15   Ht 6' (1.829 m)   Wt 74.5 kg   SpO2 100%   BMI 22.28 kg/m   Physical Exam General: Alert and awake, jaundice+ Cardiovascular: S1 S2 clear, RRR.  Respiratory: CTAB, no wheezing Gastrointestinal: Soft, nontender, nondistended, NBS Ext: no pedal edema bilaterally Skin: Jaundice   Condition at discharge: poor  The results of significant diagnostics from this hospitalization (including imaging, microbiology, ancillary and  laboratory) are listed below for reference.   Imaging Studies: IR EXCHANGE BILIARY DRAIN Result Date: 03/04/2023 INDICATION: Cholangiocarcinoma, biliary obstruction and rising bilirubin level with indwelling percutaneous cholecystostomy tube, right-sided percutaneous internal/external biliary drainage catheter and left-sided percutaneous internal/external biliary drainage catheter. Due to significant rise in bilirubin and clinical jaundice, decision has been made to replace all of the percutaneous drains under fluoroscopy as the was scheduled for drain exchange soon. EXAM: 1. EXCHANGE OF PERCUTANEOUS CHOLECYSTOSTOMY TUBE WITH CHOLANGIOGRAM UNDER FLUOROSCOPY 2. EXCHANGE OF RIGHT LOBE PERCUTANEOUS INTERNAL/EXTERNAL BILIARY DRAINAGE CATHETER WITH CHOLANGIOGRAM UNDER FLUOROSCOPY 3. EXCHANGE OF LEFT LOBE PERCUTANEOUS INTERNAL/EXTERNAL BILIARY DRAINAGE CATHETER WITH CHOLANGIOGRAM UNDER FLUOROSCOPY MEDICATIONS: None ANESTHESIA/SEDATION: None FLUOROSCOPY: Radiation Exposure Index (as provided by the fluoroscopic device): 29 mGy Kerma CONTRAST:  30 mL Omnipaque  300 COMPLICATIONS: None immediate. PROCEDURE: Informed written consent was obtained from the patient after a thorough discussion of the procedural risks, benefits and alternatives. All questions were addressed. Maximal Sterile Barrier Technique was utilized including caps, mask, sterile gowns, sterile gloves, sterile drape, hand hygiene and skin antiseptic. A timeout was performed prior to the initiation of the procedure. A 10 French percutaneous cholecystostomy tube was injected with contrast material under fluoroscopy with images saved. The catheter was removed over a guidewire and exchanged for a new 10 French catheter which was formed in the  gallbladder lumen. A 14 French right lobe percutaneous internal/external biliary drainage catheter was injected with contrast material with cholangiogram images saved. The drain was then cut and removed over a guidewire.  This was exchanged for a new 14 French catheter which was advanced to the level of the duodenum and formed. This was injected with contrast material to confirm position. A 12 French left lobe percutaneous internal/external biliary drainage catheter was injected with contrast material with cholangiogram images saved. The drain was then cut and removed over a guidewire. This was exchanged for a new 12 French catheter which was advanced to the level of the duodenum and formed. This was injected with contrast material to confirm position. All 3 biliary drainage catheters were secured at the skin with Prolene retention sutures and attached to gravity drainage bags. FINDINGS: The gallbladder is decompressed. Contrast injection shows persistent essentially occluded cystic duct outflow. There may be a trace amount of contrast entering the common bile duct. Cholangiogram via the right internal/external biliary drainage catheter demonstrates decompressed bile ducts and appropriate tube positioning with the distal catheter formed in the duodenum. The tube contained some debris but was not occluded. A new drainage catheter was placed in a similar position and is draining well after exchange. Cholangiogram via the left internal/external biliary drainage catheter demonstrates decompressed left-sided bile ducts with appropriate positioning and the distal catheter formed in the duodenum. After catheter removal, the locking retention suture from the pre-existing drain was retained in the body and removed after advancing a 3 French micropuncture dilator over one limb of the retention suture and applying tension to the suture. The entire retention suture was able to be removed. A new drainage catheter was placed in a similar position and is draining well after exchange. IMPRESSION: Exchange of 10 French percutaneous cholecystostomy, 39 French right lobe internal/external biliary drainage and 12 French left lobe internal/external  biliary drainage catheters under fluoroscopy as above. All catheters were attached to gravity bag drainage. Electronically Signed   By: Marcey Moan M.D.   On: 03/04/2023 16:56   IR EXCHANGE BILIARY DRAIN Result Date: 03/04/2023 INDICATION: Cholangiocarcinoma, biliary obstruction and rising bilirubin level with indwelling percutaneous cholecystostomy tube, right-sided percutaneous internal/external biliary drainage catheter and left-sided percutaneous internal/external biliary drainage catheter. Due to significant rise in bilirubin and clinical jaundice, decision has been made to replace all of the percutaneous drains under fluoroscopy as the was scheduled for drain exchange soon. EXAM: 1. EXCHANGE OF PERCUTANEOUS CHOLECYSTOSTOMY TUBE WITH CHOLANGIOGRAM UNDER FLUOROSCOPY 2. EXCHANGE OF RIGHT LOBE PERCUTANEOUS INTERNAL/EXTERNAL BILIARY DRAINAGE CATHETER WITH CHOLANGIOGRAM UNDER FLUOROSCOPY 3. EXCHANGE OF LEFT LOBE PERCUTANEOUS INTERNAL/EXTERNAL BILIARY DRAINAGE CATHETER WITH CHOLANGIOGRAM UNDER FLUOROSCOPY MEDICATIONS: None ANESTHESIA/SEDATION: None FLUOROSCOPY: Radiation Exposure Index (as provided by the fluoroscopic device): 29 mGy Kerma CONTRAST:  30 mL Omnipaque  300 COMPLICATIONS: None immediate. PROCEDURE: Informed written consent was obtained from the patient after a thorough discussion of the procedural risks, benefits and alternatives. All questions were addressed. Maximal Sterile Barrier Technique was utilized including caps, mask, sterile gowns, sterile gloves, sterile drape, hand hygiene and skin antiseptic. A timeout was performed prior to the initiation of the procedure. A 10 French percutaneous cholecystostomy tube was injected with contrast material under fluoroscopy with images saved. The catheter was removed over a guidewire and exchanged for a new 10 French catheter which was formed in the gallbladder lumen. A 14 French right lobe percutaneous internal/external biliary drainage catheter was  injected with contrast material with cholangiogram images saved. The drain was then cut  and removed over a guidewire. This was exchanged for a new 14 French catheter which was advanced to the level of the duodenum and formed. This was injected with contrast material to confirm position. A 12 French left lobe percutaneous internal/external biliary drainage catheter was injected with contrast material with cholangiogram images saved. The drain was then cut and removed over a guidewire. This was exchanged for a new 12 French catheter which was advanced to the level of the duodenum and formed. This was injected with contrast material to confirm position. All 3 biliary drainage catheters were secured at the skin with Prolene retention sutures and attached to gravity drainage bags. FINDINGS: The gallbladder is decompressed. Contrast injection shows persistent essentially occluded cystic duct outflow. There may be a trace amount of contrast entering the common bile duct. Cholangiogram via the right internal/external biliary drainage catheter demonstrates decompressed bile ducts and appropriate tube positioning with the distal catheter formed in the duodenum. The tube contained some debris but was not occluded. A new drainage catheter was placed in a similar position and is draining well after exchange. Cholangiogram via the left internal/external biliary drainage catheter demonstrates decompressed left-sided bile ducts with appropriate positioning and the distal catheter formed in the duodenum. After catheter removal, the locking retention suture from the pre-existing drain was retained in the body and removed after advancing a 3 French micropuncture dilator over one limb of the retention suture and applying tension to the suture. The entire retention suture was able to be removed. A new drainage catheter was placed in a similar position and is draining well after exchange. IMPRESSION: Exchange of 10 French percutaneous  cholecystostomy, 78 French right lobe internal/external biliary drainage and 12 French left lobe internal/external biliary drainage catheters under fluoroscopy as above. All catheters were attached to gravity bag drainage. Electronically Signed   By: Marcey Moan M.D.   On: 03/04/2023 16:56   IR EXCHANGE BILIARY DRAIN Result Date: 03/04/2023 INDICATION: Cholangiocarcinoma, biliary obstruction and rising bilirubin level with indwelling percutaneous cholecystostomy tube, right-sided percutaneous internal/external biliary drainage catheter and left-sided percutaneous internal/external biliary drainage catheter. Due to significant rise in bilirubin and clinical jaundice, decision has been made to replace all of the percutaneous drains under fluoroscopy as the was scheduled for drain exchange soon. EXAM: 1. EXCHANGE OF PERCUTANEOUS CHOLECYSTOSTOMY TUBE WITH CHOLANGIOGRAM UNDER FLUOROSCOPY 2. EXCHANGE OF RIGHT LOBE PERCUTANEOUS INTERNAL/EXTERNAL BILIARY DRAINAGE CATHETER WITH CHOLANGIOGRAM UNDER FLUOROSCOPY 3. EXCHANGE OF LEFT LOBE PERCUTANEOUS INTERNAL/EXTERNAL BILIARY DRAINAGE CATHETER WITH CHOLANGIOGRAM UNDER FLUOROSCOPY MEDICATIONS: None ANESTHESIA/SEDATION: None FLUOROSCOPY: Radiation Exposure Index (as provided by the fluoroscopic device): 29 mGy Kerma CONTRAST:  30 mL Omnipaque  300 COMPLICATIONS: None immediate. PROCEDURE: Informed written consent was obtained from the patient after a thorough discussion of the procedural risks, benefits and alternatives. All questions were addressed. Maximal Sterile Barrier Technique was utilized including caps, mask, sterile gowns, sterile gloves, sterile drape, hand hygiene and skin antiseptic. A timeout was performed prior to the initiation of the procedure. A 10 French percutaneous cholecystostomy tube was injected with contrast material under fluoroscopy with images saved. The catheter was removed over a guidewire and exchanged for a new 10 French catheter which was  formed in the gallbladder lumen. A 14 French right lobe percutaneous internal/external biliary drainage catheter was injected with contrast material with cholangiogram images saved. The drain was then cut and removed over a guidewire. This was exchanged for a new 14 French catheter which was advanced to the level of the duodenum and formed. This was  injected with contrast material to confirm position. A 12 French left lobe percutaneous internal/external biliary drainage catheter was injected with contrast material with cholangiogram images saved. The drain was then cut and removed over a guidewire. This was exchanged for a new 12 French catheter which was advanced to the level of the duodenum and formed. This was injected with contrast material to confirm position. All 3 biliary drainage catheters were secured at the skin with Prolene retention sutures and attached to gravity drainage bags. FINDINGS: The gallbladder is decompressed. Contrast injection shows persistent essentially occluded cystic duct outflow. There may be a trace amount of contrast entering the common bile duct. Cholangiogram via the right internal/external biliary drainage catheter demonstrates decompressed bile ducts and appropriate tube positioning with the distal catheter formed in the duodenum. The tube contained some debris but was not occluded. A new drainage catheter was placed in a similar position and is draining well after exchange. Cholangiogram via the left internal/external biliary drainage catheter demonstrates decompressed left-sided bile ducts with appropriate positioning and the distal catheter formed in the duodenum. After catheter removal, the locking retention suture from the pre-existing drain was retained in the body and removed after advancing a 3 French micropuncture dilator over one limb of the retention suture and applying tension to the suture. The entire retention suture was able to be removed. A new drainage catheter  was placed in a similar position and is draining well after exchange. IMPRESSION: Exchange of 10 French percutaneous cholecystostomy, 28 French right lobe internal/external biliary drainage and 12 French left lobe internal/external biliary drainage catheters under fluoroscopy as above. All catheters were attached to gravity bag drainage. Electronically Signed   By: Marcey Moan M.D.   On: 03/04/2023 16:56   CT ABDOMEN PELVIS W CONTRAST Result Date: 03/04/2023 CLINICAL DATA:  Vomiting.  New diagnosis of cholangiocarcinoma. EXAM: CT ABDOMEN AND PELVIS WITH CONTRAST TECHNIQUE: Multidetector CT imaging of the abdomen and pelvis was performed using the standard protocol following bolus administration of intravenous contrast. RADIATION DOSE REDUCTION: This exam was performed according to the departmental dose-optimization program which includes automated exposure control, adjustment of the mA and/or kV according to patient size and/or use of iterative reconstruction technique. CONTRAST:  OMNIPAQUE  IOHEXOL  300 MG/ML  SOLN COMPARISON:  PET CT 02/28/2023, abdominopelvic CT 02/06/2023 FINDINGS: Lower chest: The heart is enlarged. Atelectasis in the lung bases. Trace left pleural thickening. Hepatobiliary: Indwelling internal/external biliary drains in the right left lobe of the liver. The internal Pigtails are in the duodenum. Only minimal intrahepatic biliary ductal dilatation above the level of the drains. No intrahepatic fluid collection. The central low-density lesion on prior CT is poorly defined on the current exam. Cholecystostomy tube which is coiled in the gallbladder fundus. Gallbladder is decompressed. Pancreas: Atrophic. No inflammation. Pancreatic duct is prominent at 4 mm. Spleen: Normal in size without focal abnormality. Adrenals/Urinary Tract: No adrenal nodule. No hydronephrosis. No renal calculi or suspicious renal lesion. Unremarkable urinary bladder. Stomach/Bowel: No abnormal gastric  distension. There is mild wall thickening about the distal stomach series 3, image 27. No evidence of perforation. No small bowel obstruction or inflammatory change. Normal appendix. Moderate colonic stool burden. Descending and sigmoid diverticulosis without focal diverticulitis. Vascular/Lymphatic: Aortic atherosclerosis and tortuosity. No aneurysm. No thrombus in the main portal vein. The left intrahepatic portal vein is attenuated. No enlarged lymph nodes in the abdomen or pelvis. Reproductive: Enlarged prostate gland spans 5.3 cm transverse. Other: Trace free fluid adjacent to the right lobe of  the liver. No free air. No drainable fluid collection. Musculoskeletal: Chronic hand unchanged compression fractures of T11, L1, and L5. No acute osseous findings. IMPRESSION: 1. Mild wall thickening about the distal stomach, may represent gastritis or peptic ulcer disease. 2. Indwelling internal/external biliary drains in the right and left lobe of the liver. Only minimal intrahepatic biliary ductal dilatation above the level of the drains. The central low-density lesion on prior CT is poorly defined on the current exam. 3. Cholecystostomy tube is coiled in the gallbladder fundus. Gallbladder is decompressed. 4. Colonic diverticulosis without diverticulitis. 5. Enlarged prostate gland. Aortic Atherosclerosis (ICD10-I70.0). Electronically Signed   By: Andrea Gasman M.D.   On: 03/04/2023 01:12   CT Head Wo Contrast Result Date: 03/04/2023 CLINICAL DATA:  Headache EXAM: CT HEAD WITHOUT CONTRAST TECHNIQUE: Contiguous axial images were obtained from the base of the skull through the vertex without intravenous contrast. RADIATION DOSE REDUCTION: This exam was performed according to the departmental dose-optimization program which includes automated exposure control, adjustment of the mA and/or kV according to patient size and/or use of iterative reconstruction technique. COMPARISON:  04/14/2020 FINDINGS: Brain: There  is no mass, hemorrhage or extra-axial collection. The size and configuration of the ventricles and extra-axial CSF spaces are normal. There is hypoattenuation of the white matter, most commonly indicating chronic small vessel disease. Vascular: No hyperdense vessel or unexpected vascular calcification. Skull: The visualized skull base, calvarium and extracranial soft tissues are normal. Sinuses/Orbits: No fluid levels or advanced mucosal thickening of the visualized paranasal sinuses. No mastoid or middle ear effusion. Normal orbits. Other: None. IMPRESSION: 1. No acute intracranial abnormality. 2. Chronic small vessel disease. Electronically Signed   By: Franky Stanford M.D.   On: 03/04/2023 00:35   DG Chest Port 1 View Result Date: 03/03/2023 CLINICAL DATA:  Recent emesis, initial encounter EXAM: PORTABLE CHEST 1 VIEW COMPARISON:  02/02/2023 FINDINGS: Cardiac shadow is enlarged. Postsurgical changes are again noted. Lungs are well aerated bilaterally. Old rib fractures are noted on the right. No acute abnormality seen. IMPRESSION: No active disease. Electronically Signed   By: Oneil Devonshire M.D.   On: 03/03/2023 23:46   NM PET Image Initial (PI) Skull Base To Thigh Result Date: 02/28/2023 CLINICAL DATA:  Initial treatment strategy for cholangiocarcinoma. EXAM: NUCLEAR MEDICINE PET SKULL BASE TO THIGH TECHNIQUE: 9.0 mCi F-18 FDG was injected intravenously. Full-ring PET imaging was performed from the skull base to thigh after the radiotracer. CT data was obtained and used for attenuation correction and anatomic localization. Fasting blood glucose: 94 mg/dl COMPARISON:  CT chest dated 02/13/2023. CT abdomen dated 02/06/2023. CT abdomen/pelvis dated 02/02/2023. FINDINGS: Mediastinal blood pool activity: SUV max 3.0 Liver activity: SUV max NA NECK: No hypermetabolic lymph nodes in the neck. Mild hypermetabolism along the right posterior neck, favored to be physiologic. Incidental CT findings: None. CHEST: No  hypermetabolic mediastinal or hilar nodes. No suspicious pulmonary nodules on the CT scan. Incidental CT findings: Atherosclerotic calcifications of the aortic arch. Mild coronary atherosclerosis of the LAD. ABDOMEN/PELVIS: Status post percutaneous cholecystostomy. Associated mild hypermetabolism in the cholecystostomy bed, max SUV 8.1. Indwelling internal/external biliary drains bilaterally, terminating in the proximal duodenum. No residual intrahepatic ductal dilatation on CT. Mild focal hypermetabolism along the proximal aspect of the stents, max SUV 6.3, equivocal. No hypermetabolic abdominopelvic lymphadenopathy. Incidental CT findings: Mild left colonic diverticulosis, without evidence of diverticulitis. Atherosclerotic calcifications abdominal aorta and branch vessels. SKELETON: Multifocal osseous metastases, including: --C7 vertebral body, max SUV 8.0 --Left posterior elements at  T1, max SUV 5.2 --Left transverse process at T3, max SUV 5.4 --T3 vertebral body, max SUV 6.8 --Left post lateral 5th rib, max SUV 2.4 Incidental CT findings: Known thoracolumbar spine compression fracture deformities, better evaluated on prior CT. Degenerative changes of the thoracolumbar spine. Median sternotomy. IMPRESSION: Indwelling internal/external biliary drains bilaterally, terminating in the proximal duodenum. No residual intrahepatic ductal dilatation on CT. Mild focal hypermetabolism along the proximal common duct, equivocal, but possibly corresponding to the patient's known cholangiocarcinoma. Status post percutaneous cholecystostomy. Associated mild hypermetabolism in the cholecystostomy bed, nonspecific and possibly postprocedural. Multifocal osseous metastases, as above. Electronically Signed   By: Pinkie Pebbles M.D.   On: 02/28/2023 17:04   IR EXCHANGE BILIARY DRAIN Result Date: 02/14/2023 INDICATION: Patient initially diagnosed with Mirizzi syndrome, post cholecystostomy tube placement on 01/02/2023.  Unfortunately, patient experienced worsening hyperbilirubinemia, ultimately undergoing attempted though unsuccessful ERCP. As such, patient underwent placement of a right-sided biliary drainage catheter on the 01/31/2023 and ultimately placement of a left-sided biliary drainage catheter on 02/07/2023. 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 Since that time, despite cholecystostomy and bilateral biliary drainage catheter placement, the patient's hyperbilirubinemia has persisted. As such, the patient presents today for cholangiogram and potential biliary drainage catheter exchange and up sizing. EXAM: 1. Cholangiogram via existing cholecystostomy tube. CHOLANGIOGRAM VIA EXISTING CHOLECYSTOSTOMY TUBE 2. FLUOROSCOPIC GUIDED EXCHANGE AND UP SIZING TO NOW 14 FRENCH RIGHT-SIDED BILIARY DRAINAGE CATHETER 3. FLUOROSCOPIC GUIDED EXCHANGE AND UP SIZING TO NOW 12 FRENCH LEFT-SIDED BILIARY DRAINAGE CATHETER COMPARISON:  COMPARISON Image guided left-sided biliary drainage catheter placement and brush biopsy - 02/07/2023 Image guided right-sided biliary drainage catheter placement - 01/31/2023 Cholecystostomy tube exchange - 01/24/2023 Image guided cholecystostomy tube placement - 01/02/2023 CT abdomen and pelvis - 02/06/2023; 02/02/2023 Abdominal MRI - 12/29/2022 CONTRAST:  25 mL Omnipaque -300 administered into the collecting system ANESTHESIA/SEDATION: ANESTHESIA/SEDATION 3 minutes, 6 seconds (117 mGy) FLUOROSCOPY TIME:  3 minutes, 6 seconds (1 image 17 mGy) COMPLICATIONS: None immediate. TECHNIQUE: Informed written consent was obtained from the patient after a discussion of the risks, benefits and alternatives to treatment. Questions regarding the procedure were encouraged and answered. A timeout was performed prior to the initiation of the procedure. Preprocedural spot fluoroscopic image was obtained of the right upper abdominal quadrant. Cholangiogram were performed via the right and  left biliary drainage catheters as well as the cholecystostomy tube. Next, the external portion of the left lower drainage catheter was cut and cannulated with an Amplatz wire which was advanced through the biliary drainage catheter to the level of the duodenum. Under fluoroscopic guided exchange, the pre-existing 10 French left-sided biliary drainage catheter was exchanged for a new, slightly larger, now 30 French biliary drainage catheter with end coiled and locked within the duodenum. The identical procedure was performed for the right-sided biliary drainage catheter, ultimately with exchange of the pre-existing 12 French biliary drainage catheter for a new, slightly larger now 68 French biliary drainage catheter with end coiled and locked within the duodenum. Small amount of contrast was injected via the exchanged biliary drainage catheters and post fluoroscopic and radiographic images were obtained. The biliary drainage catheters were and secured to the skin with interrupted sutures and all drainage catheters were reconnected to gravity bags. Dressings were applied. The patient tolerated the procedure well without immediate postprocedural complication. FINDINGS: Appropriately positioned and functioning cholecystostomy tube. There is persistent occlusion of the cystic duct at its mid/peripheral aspects. Appropriate positioning and functionality of both the right and left-sided  biliary drainage catheters with opacification of the intrahepatic biliary system as well as passage of contrast through the catheters to the level of the duodenum. After fluoroscopic guided exchange and up sizing, both biliary drainage catheters are appropriately positioned with end coiled and locked within the duodenum. IMPRESSION: 1. Successful fluoroscopic guided exchange and up sizing of now 66 French right-sided biliary drainage catheter with end coiled and locked within the duodenum. 2. Successful fluoroscopic guided exchange and up  sizing of now 68 French left-sided biliary drainage catheter with end coiled and locked within the duodenum. 3. Appropriately positioned and functioning cholecystostomy tube with persistent complete occlusion of the mid/peripheral aspect of the cystic duct. The cholecystostomy tube was not exchanged as it was most recently exchanged on 01/24/2023. PLAN: Recommend obtaining daily CMP. If patient's hyperbilirubinemia immediate does not improve in the coming days, would recommend further evaluation with repeat either contrast-enhanced abdominal CT or MRCP as indicated. Electronically Signed   By: Norleen Roulette M.D.   On: 02/14/2023 07:15   IR EXCHANGE BILIARY DRAIN Result Date: 02/14/2023 INDICATION: Patient initially diagnosed with Mirizzi syndrome, post cholecystostomy tube placement on 01/02/2023. Unfortunately, patient experienced worsening hyperbilirubinemia, ultimately undergoing attempted though unsuccessful ERCP. As such, patient underwent placement of a right-sided biliary drainage catheter on the 01/31/2023 and ultimately placement of a left-sided biliary drainage catheter on 02/07/2023. 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 Since that time, despite cholecystostomy and bilateral biliary drainage catheter placement, the patient's hyperbilirubinemia has persisted. As such, the patient presents today for cholangiogram and potential biliary drainage catheter exchange and up sizing. EXAM: 1. Cholangiogram via existing cholecystostomy tube. CHOLANGIOGRAM VIA EXISTING CHOLECYSTOSTOMY TUBE 2. FLUOROSCOPIC GUIDED EXCHANGE AND UP SIZING TO NOW 14 FRENCH RIGHT-SIDED BILIARY DRAINAGE CATHETER 3. FLUOROSCOPIC GUIDED EXCHANGE AND UP SIZING TO NOW 12 FRENCH LEFT-SIDED BILIARY DRAINAGE CATHETER COMPARISON:  COMPARISON Image guided left-sided biliary drainage catheter placement and brush biopsy - 02/07/2023 Image guided right-sided biliary drainage catheter placement - 01/31/2023  Cholecystostomy tube exchange - 01/24/2023 Image guided cholecystostomy tube placement - 01/02/2023 CT abdomen and pelvis - 02/06/2023; 02/02/2023 Abdominal MRI - 12/29/2022 CONTRAST:  25 mL Omnipaque -300 administered into the collecting system ANESTHESIA/SEDATION: ANESTHESIA/SEDATION 3 minutes, 6 seconds (117 mGy) FLUOROSCOPY TIME:  3 minutes, 6 seconds (1 image 17 mGy) COMPLICATIONS: None immediate. TECHNIQUE: Informed written consent was obtained from the patient after a discussion of the risks, benefits and alternatives to treatment. Questions regarding the procedure were encouraged and answered. A timeout was performed prior to the initiation of the procedure. Preprocedural spot fluoroscopic image was obtained of the right upper abdominal quadrant. Cholangiogram were performed via the right and left biliary drainage catheters as well as the cholecystostomy tube. Next, the external portion of the left lower drainage catheter was cut and cannulated with an Amplatz wire which was advanced through the biliary drainage catheter to the level of the duodenum. Under fluoroscopic guided exchange, the pre-existing 10 French left-sided biliary drainage catheter was exchanged for a new, slightly larger, now 48 French biliary drainage catheter with end coiled and locked within the duodenum. The identical procedure was performed for the right-sided biliary drainage catheter, ultimately with exchange of the pre-existing 12 French biliary drainage catheter for a new, slightly larger now 71 French biliary drainage catheter with end coiled and locked within the duodenum. Small amount of contrast was injected via the exchanged biliary drainage catheters and post fluoroscopic and radiographic images were obtained. The biliary drainage catheters were and secured to  the skin with interrupted sutures and all drainage catheters were reconnected to gravity bags. Dressings were applied. The patient tolerated the procedure well without  immediate postprocedural complication. FINDINGS: Appropriately positioned and functioning cholecystostomy tube. There is persistent occlusion of the cystic duct at its mid/peripheral aspects. Appropriate positioning and functionality of both the right and left-sided biliary drainage catheters with opacification of the intrahepatic biliary system as well as passage of contrast through the catheters to the level of the duodenum. After fluoroscopic guided exchange and up sizing, both biliary drainage catheters are appropriately positioned with end coiled and locked within the duodenum. IMPRESSION: 1. Successful fluoroscopic guided exchange and up sizing of now 62 French right-sided biliary drainage catheter with end coiled and locked within the duodenum. 2. Successful fluoroscopic guided exchange and up sizing of now 51 French left-sided biliary drainage catheter with end coiled and locked within the duodenum. 3. Appropriately positioned and functioning cholecystostomy tube with persistent complete occlusion of the mid/peripheral aspect of the cystic duct. The cholecystostomy tube was not exchanged as it was most recently exchanged on 01/24/2023. PLAN: Recommend obtaining daily CMP. If patient's hyperbilirubinemia immediate does not improve in the coming days, would recommend further evaluation with repeat either contrast-enhanced abdominal CT or MRCP as indicated. Electronically Signed   By: Norleen Roulette M.D.   On: 02/14/2023 07:15   IR CHOLANGIOGRAM EXISTING TUBE Result Date: 02/14/2023 INDICATION: Patient initially diagnosed with Mirizzi syndrome, post cholecystostomy tube placement on 01/02/2023. Unfortunately, patient experienced worsening hyperbilirubinemia, ultimately undergoing attempted though unsuccessful ERCP. As such, patient underwent placement of a right-sided biliary drainage catheter on the 01/31/2023 and ultimately placement of a left-sided biliary drainage catheter on 02/07/2023. 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 Since that time, despite cholecystostomy and bilateral biliary drainage catheter placement, the patient's hyperbilirubinemia has persisted. As such, the patient presents today for cholangiogram and potential biliary drainage catheter exchange and up sizing. EXAM: 1. Cholangiogram via existing cholecystostomy tube. CHOLANGIOGRAM VIA EXISTING CHOLECYSTOSTOMY TUBE 2. FLUOROSCOPIC GUIDED EXCHANGE AND UP SIZING TO NOW 14 FRENCH RIGHT-SIDED BILIARY DRAINAGE CATHETER 3. FLUOROSCOPIC GUIDED EXCHANGE AND UP SIZING TO NOW 12 FRENCH LEFT-SIDED BILIARY DRAINAGE CATHETER COMPARISON:  COMPARISON Image guided left-sided biliary drainage catheter placement and brush biopsy - 02/07/2023 Image guided right-sided biliary drainage catheter placement - 01/31/2023 Cholecystostomy tube exchange - 01/24/2023 Image guided cholecystostomy tube placement - 01/02/2023 CT abdomen and pelvis - 02/06/2023; 02/02/2023 Abdominal MRI - 12/29/2022 CONTRAST:  25 mL Omnipaque -300 administered into the collecting system ANESTHESIA/SEDATION: ANESTHESIA/SEDATION 3 minutes, 6 seconds (117 mGy) FLUOROSCOPY TIME:  3 minutes, 6 seconds (1 image 17 mGy) COMPLICATIONS: None immediate. TECHNIQUE: Informed written consent was obtained from the patient after a discussion of the risks, benefits and alternatives to treatment. Questions regarding the procedure were encouraged and answered. A timeout was performed prior to the initiation of the procedure. Preprocedural spot fluoroscopic image was obtained of the right upper abdominal quadrant. Cholangiogram were performed via the right and left biliary drainage catheters as well as the cholecystostomy tube. Next, the external portion of the left lower drainage catheter was cut and cannulated with an Amplatz wire which was advanced through the biliary drainage catheter to the level of the duodenum. Under fluoroscopic guided exchange, the pre-existing 10  French left-sided biliary drainage catheter was exchanged for a new, slightly larger, now 11 French biliary drainage catheter with end coiled and locked within the duodenum. The identical procedure was performed for the right-sided biliary drainage catheter, ultimately  with exchange of the pre-existing 47 French biliary drainage catheter for a new, slightly larger now 13 French biliary drainage catheter with end coiled and locked within the duodenum. Small amount of contrast was injected via the exchanged biliary drainage catheters and post fluoroscopic and radiographic images were obtained. The biliary drainage catheters were and secured to the skin with interrupted sutures and all drainage catheters were reconnected to gravity bags. Dressings were applied. The patient tolerated the procedure well without immediate postprocedural complication. FINDINGS: Appropriately positioned and functioning cholecystostomy tube. There is persistent occlusion of the cystic duct at its mid/peripheral aspects. Appropriate positioning and functionality of both the right and left-sided biliary drainage catheters with opacification of the intrahepatic biliary system as well as passage of contrast through the catheters to the level of the duodenum. After fluoroscopic guided exchange and up sizing, both biliary drainage catheters are appropriately positioned with end coiled and locked within the duodenum. IMPRESSION: 1. Successful fluoroscopic guided exchange and up sizing of now 21 French right-sided biliary drainage catheter with end coiled and locked within the duodenum. 2. Successful fluoroscopic guided exchange and up sizing of now 57 French left-sided biliary drainage catheter with end coiled and locked within the duodenum. 3. Appropriately positioned and functioning cholecystostomy tube with persistent complete occlusion of the mid/peripheral aspect of the cystic duct. The cholecystostomy tube was not exchanged as it was most  recently exchanged on 01/24/2023. PLAN: Recommend obtaining daily CMP. If patient's hyperbilirubinemia immediate does not improve in the coming days, would recommend further evaluation with repeat either contrast-enhanced abdominal CT or MRCP as indicated. Electronically Signed   By: Norleen Roulette M.D.   On: 02/14/2023 07:15   CT CHEST WO CONTRAST Result Date: 02/13/2023 CLINICAL DATA:  Hepatocellular carcinoma, cholangiocarcinoma, staging evaluation EXAM: CT CHEST WITHOUT CONTRAST TECHNIQUE: Multidetector CT imaging of the chest was performed following the standard protocol without IV contrast. RADIATION DOSE REDUCTION: This exam was performed according to the departmental dose-optimization program which includes automated exposure control, adjustment of the mA and/or kV according to patient size and/or use of iterative reconstruction technique. COMPARISON:  02/06/2023 FINDINGS: Cardiovascular: Unenhanced imaging of the heart is unremarkable without pericardial effusion. Mitral valve prosthesis. 4.1 cm ascending thoracic aortic aneurysm. Evaluation of the vascular lumen is limited without IV contrast. Atherosclerosis of the aorta and coronary vasculature. Mediastinum/Nodes: No enlarged mediastinal or axillary lymph nodes. Thyroid  gland, trachea, and esophagus demonstrate no significant findings. Lungs/Pleura: No acute airspace disease, effusion, or pneumothorax. Linear consolidation within the right lower lobe consistent with subsegmental atelectasis or scarring. Upper Abdomen: Percutaneous biliary drains are identified traversing the left and right lobes of the liver. Downstream extent excluded by slice selection. Percutaneous cholecystostomy tube is seen coiled within the gallbladder fossa. Minimal pneumobilia. Continued edema at the level of porta hepatis which may reflect known cholangiocarcinoma. Musculoskeletal: Large sclerotic lesions are seen within the C7 and T3 vertebral body suspicious for metastatic  disease. There are chronic compression deformities of T4, T9, T11, and L1. Reconstructed images demonstrate no additional findings. IMPRESSION: 1. Large sclerotic lesions within the C7 and T3 vertebral bodies consistent with bony metastatic disease. 2. No other signs of intrathoracic metastases. 3. Stable findings within the liver consistent with known cholangiocarcinoma and indwelling biliary and gallbladder drains. Please refer to recent CT abdomen exam for full description of findings. 4. 4.1 cm ascending thoracic aortic aneurysm. Recommend annual imaging followup by CTA or MRA. This recommendation follows 2010 ACCF/AHA/AATS/ACR/ASA/SCA/SCAI/SIR/STS/SVM Guidelines for the Diagnosis and Management of Patients  with Thoracic Aortic Disease. Circulation. 2010; 121: [id number]. Aortic aneurysm NOS (ICD10-I71.9) 5. Aortic Atherosclerosis (ICD10-I70.0). Coronary artery atherosclerosis. Electronically Signed   By: Ozell Daring M.D.   On: 02/13/2023 18:57   IR INT EXT BILIARY DRAIN WITH CHOLANGIOGRAM Result Date: 02/07/2023 INDICATION: 78 year old gentleman with history of biliary obstruction underwent percutaneous cholecystostomy placement on 01/02/2023 and replacement on 01/24/2023. Continued symptoms of jaundice and hyperbilirubinemia prompteda MRI on 01/24/2023. Enhancement of the ductal system was suspicious for cholangitis. Right hepatic biliary drain was placed on 01/31/2023. Imaging from [facility] on 01/31/2023 was suspicious for Klatskin tumor with irregular narrowing of central right and left main hepatic ducts. Despite placement of the right hepatic drain, hyperbilirubinemia and jaundice has persisted. He returns today for placement of left hepatic biliary drain as well as brush biopsy of the stenosed segment of the right hepatic duct through a pre-existing access. EXAM: 1. Ultrasound and fluoroscopy guided left hepatic internal external biliary drain placement 2. Fluoroscopy guided exchange of existing right  hepatic internal external biliary drain 3. Brush biopsy of common and right hepatic bile ducts 4. Bilateral percutaneous transhepatic cholangiogram MEDICATIONS: Rocephin  2 g IV; The antibiotic was administered within an appropriate time frame prior to the initiation of the procedure. Demerol  IV 50 mg ANESTHESIA/SEDATION: Moderate (conscious) sedation was employed during this procedure. A total of Versed  3 mg and Fentanyl  75 mcg was administered intravenously by the radiology nurse. Total intra-service moderate Sedation Time: 56 minutes. The patient's level of consciousness and vital signs were monitored continuously by radiology nursing throughout the procedure under my direct supervision. FLUOROSCOPY: Radiation Exposure Index (as provided by the fluoroscopic device): 98 mGy Kerma COMPLICATIONS: None immediate. PROCEDURE: Informed written consent was obtained from the patient after a thorough discussion of the procedural risks, benefits and alternatives. All questions were addressed. Maximal Sterile Barrier Technique was utilized including caps, mask, sterile gowns, sterile gloves, sterile drape, hand hygiene and skin antiseptic. A timeout was performed prior to the initiation of the procedure. Patient positioned supine on the procedure table. Epigastric and right upper quadrant abdominal wall skin prepped and draped in the usual sterile fashion. Sonographic evaluation of the left hepatic lobe showed mildly dilated ducts. Following local lidocaine  administration, a left hepatic lobe duct was successfully accessed with a 21 gauge needle. 21 gauge needle was removed over 0.018 inch guidewire. The guidewire was advanced to the level of the common bile duct. Contrast administered through the right hepatic drain confirmed appropriate positioning within the right hepatic bile ducts and doudenum. The right-sided 47 French drain was cut and removed over 0.035 inch guidewire and replaced with an 8 French sheath. The 8 French  sheath was advanced beyond the site of right hepatic and common bile duct stenosis. Two brush biopsies were performed at the stenotic segments of the common and right main hepatic ducts. Both samples were sent to pathology in sterile saline. New 12 French internal external biliary drain was inserted over the 0.035 inch guidewire. The pigtail was formed within the doudenum. Attention was again turned to the left hepatic lobe. Transitional dilator set was inserted over the 0.018 inch guidewire. Kumpe catheter and stiff glidewire were advanced to the level of the doudenum utilizing fluoroscopic guidance through this access. Kumpe the catheter was removed and a 10 French internal external biliary drain was inserted. The distal pigtail was formed within the doudenum. Contrast administered through both drains confirmed appropriate positioning within the doudenum as well as within the intrahepatic bile ducts. Both drains were  secured to skin with silk suture and connected to bag. IMPRESSION: 1. Successful brush biopsy of the stenotic segments of the common bile and right main bile ducts. 2. Successful replacement of 12 French right hepatic internal external biliary drain. 3. Successful insertion of 10 French left hepatic internal external biliary drain. Electronically Signed   By: Aliene Lloyd M.D.   On: 02/07/2023 16:36   IR ENDOLUMINAL BX OF BILIARY TREE Result Date: 02/07/2023 INDICATION: 78 year old gentleman with history of biliary obstruction underwent percutaneous cholecystostomy placement on 01/02/2023 and replacement on 01/24/2023. Continued symptoms of jaundice and hyperbilirubinemia prompteda MRI on 01/24/2023. Enhancement of the ductal system was suspicious for cholangitis. Right hepatic biliary drain was placed on 01/31/2023. [facility] on 01/31/2023 was suspicious for Klatskin tumor with irregular narrowing of central right and left main hepatic ducts. Despite placement of the right hepatic drain,  hyperbilirubinemia and jaundice has persisted. He returns today for placement of left hepatic biliary drain as well as brush biopsy of the stenosed segment of the right hepatic duct through a pre-existing access. EXAM: 1. Ultrasound and fluoroscopy guided left hepatic internal external biliary drain placement 2. Fluoroscopy guided exchange of existing right hepatic internal external biliary drain 3. Brush biopsy of common and right hepatic bile ducts 4. Bilateral percutaneous transhepatic cholangiogram MEDICATIONS: Rocephin  2 g IV; The antibiotic was administered within an appropriate time frame prior to the initiation of the procedure. Demerol  IV 50 mg ANESTHESIA/SEDATION: Moderate (conscious) sedation was employed during this procedure. A total of Versed  3 mg and Fentanyl  75 mcg was administered intravenously by the radiology nurse. Total intra-service moderate Sedation Time: 56 minutes. The patient's level of consciousness and vital signs were monitored continuously by radiology nursing throughout the procedure under my direct supervision. FLUOROSCOPY: Radiation Exposure Index (as provided by the fluoroscopic device): 98 mGy Kerma COMPLICATIONS: None immediate. PROCEDURE: Informed written consent was obtained from the patient after a thorough discussion of the procedural risks, benefits and alternatives. All questions were addressed. Maximal Sterile Barrier Technique was utilized including caps, mask, sterile gowns, sterile gloves, sterile drape, hand hygiene and skin antiseptic. A timeout was performed prior to the initiation of the procedure. Patient positioned supine on the procedure table. Epigastric and right upper quadrant abdominal wall skin prepped and draped in the usual sterile fashion. Sonographic evaluation of the left hepatic lobe showed mildly dilated ducts. Following local lidocaine  administration, a left hepatic lobe duct was successfully accessed with a 21 gauge needle. 21 gauge needle was  removed over 0.018 inch guidewire. The guidewire was advanced to the level of the common bile duct. Contrast administered through the right hepatic drain confirmed appropriate positioning within the right hepatic bile ducts and doudenum. The right-sided 12 French drain was cut and removed over 0.035 inch guidewire and replaced with an 8 French sheath. The 8 French sheath was advanced beyond the site of right hepatic and common bile duct stenosis. Two brush biopsies were performed at the stenotic segments of the common and right main hepatic ducts. Both samples were sent to pathology in sterile saline. New 12 French internal external biliary drain was inserted over the 0.035 inch guidewire. The pigtail was formed within the doudenum. Attention was again turned to the left hepatic lobe. Transitional dilator set was inserted over the 0.018 inch guidewire. Kumpe catheter and stiff glidewire were advanced to the level of the doudenum utilizing fluoroscopic guidance through this access. Kumpe the catheter was removed and a 10 French internal external biliary drain  was inserted. The distal pigtail was formed within the doudenum. Contrast administered through both drains confirmed appropriate positioning within the doudenum as well as within the intrahepatic bile ducts. Both drains were secured to skin with silk suture and connected to bag. IMPRESSION: 1. Successful brush biopsy of the stenotic segments of the common bile and right main bile ducts. 2. Successful replacement of 12 French right hepatic internal external biliary drain. 3. Successful insertion of 10 French left hepatic internal external biliary drain. Electronically Signed   By: Aliene Lloyd M.D.   On: 02/07/2023 16:36   IR ENDOLUMINAL BX OF BILIARY TREE Result Date: 02/07/2023 INDICATION: 78 year old gentleman with history of biliary obstruction underwent percutaneous cholecystostomy placement on 01/02/2023 and replacement on 01/24/2023. Continued symptoms  of jaundice and hyperbilirubinemia prompteda MRI on 01/24/2023. Enhancement of the ductal system was suspicious for cholangitis. Right hepatic biliary drain was placed on 01/31/2023. 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 Despite placement of the right hepatic drain, hyperbilirubinemia and jaundice has persisted. He returns today for placement of left hepatic biliary drain as well as brush biopsy of the stenosed segment of the right hepatic duct through a pre-existing access. EXAM: 1. Ultrasound and fluoroscopy guided left hepatic internal external biliary drain placement 2. Fluoroscopy guided exchange of existing right hepatic internal external biliary drain 3. Brush biopsy of common and right hepatic bile ducts 4. Bilateral percutaneous transhepatic cholangiogram MEDICATIONS: Rocephin  2 g IV; The antibiotic was administered within an appropriate time frame prior to the initiation of the procedure. Demerol  IV 50 mg ANESTHESIA/SEDATION: Moderate (conscious) sedation was employed during this procedure. A total of Versed  3 mg and Fentanyl  75 mcg was administered intravenously by the radiology nurse. Total intra-service moderate Sedation Time: 56 minutes. The patient's level of consciousness and vital signs were monitored continuously by radiology nursing throughout the procedure under my direct supervision. FLUOROSCOPY: Radiation Exposure Index (as provided by the fluoroscopic device): 98 mGy Kerma COMPLICATIONS: None immediate. PROCEDURE: Informed written consent was obtained from the patient after a thorough discussion of the procedural risks, benefits and alternatives. All questions were addressed. Maximal Sterile Barrier Technique was utilized including caps, mask, sterile gowns, sterile gloves, sterile drape, hand hygiene and skin antiseptic. A timeout was performed prior to the initiation of the procedure. Patient  positioned supine on the procedure table. Epigastric and right upper quadrant abdominal wall skin prepped and draped in the usual sterile fashion. Sonographic evaluation of the left hepatic lobe showed mildly dilated ducts. Following local lidocaine  administration, a left hepatic lobe duct was successfully accessed with a 21 gauge needle. 21 gauge needle was removed over 0.018 inch guidewire. The guidewire was advanced to the level of the common bile duct. Contrast administered through the right hepatic drain confirmed appropriate positioning within the right hepatic bile ducts and doudenum. The right-sided 86 French drain was cut and removed over 0.035 inch guidewire and replaced with an 8 French sheath. The 8 French sheath was advanced beyond the site of right hepatic and common bile duct stenosis. Two brush biopsies were performed at the stenotic segments of the common and right main hepatic ducts. Both samples were sent to pathology in sterile saline. New 12 French internal external biliary drain was inserted over the 0.035 inch guidewire. The pigtail was formed within the doudenum. Attention was again turned to the left hepatic lobe. Transitional dilator set was inserted over the 0.018 inch guidewire.  Kumpe catheter and stiff glidewire were advanced to the level of the doudenum utilizing fluoroscopic guidance through this access. Kumpe the catheter was removed and a 10 French internal external biliary drain was inserted. The distal pigtail was formed within the doudenum. Contrast administered through both drains confirmed appropriate positioning within the doudenum as well as within the intrahepatic bile ducts. Both drains were secured to skin with silk suture and connected to bag. IMPRESSION: 1. Successful brush biopsy of the stenotic segments of the common bile and right main bile ducts. 2. Successful replacement of 12 French right hepatic internal external biliary drain. 3. Successful insertion of 10 French  left hepatic internal external biliary drain. Electronically Signed   By: Aliene Lloyd M.D.   On: 02/07/2023 16:36   IR EXCHANGE BILIARY DRAIN Result Date: 02/07/2023 INDICATION: 78 year old gentleman with history of biliary obstruction underwent percutaneous cholecystostomy placement on 01/02/2023 and replacement on 01/24/2023. Continued symptoms of jaundice and hyperbilirubinemia prompteda MRI on 01/24/2023. Enhancement of the ductal system was suspicious for cholangitis. Right hepatic biliary drain was placed on 01/31/2023. 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 Despite placement of the right hepatic drain, hyperbilirubinemia and jaundice has persisted. He returns today for placement of left hepatic biliary drain as well as brush biopsy of the stenosed segment of the right hepatic duct through a pre-existing access. EXAM: 1. Ultrasound and fluoroscopy guided left hepatic internal external biliary drain placement 2. Fluoroscopy guided exchange of existing right hepatic internal external biliary drain 3. Brush biopsy of common and right hepatic bile ducts 4. Bilateral percutaneous transhepatic cholangiogram MEDICATIONS: Rocephin  2 g IV; The antibiotic was administered within an appropriate time frame prior to the initiation of the procedure. Demerol  IV 50 mg ANESTHESIA/SEDATION: Moderate (conscious) sedation was employed during this procedure. A total of Versed  3 mg and Fentanyl  75 mcg was administered intravenously by the radiology nurse. Total intra-service moderate Sedation Time: 56 minutes. The patient's level of consciousness and vital signs were monitored continuously by radiology nursing throughout the procedure under my direct supervision. FLUOROSCOPY: Radiation Exposure Index (as provided by the fluoroscopic device): 98 mGy Kerma COMPLICATIONS: None immediate. PROCEDURE: Informed written consent was obtained  from the patient after a thorough discussion of the procedural risks, benefits and alternatives. All questions were addressed. Maximal Sterile Barrier Technique was utilized including caps, mask, sterile gowns, sterile gloves, sterile drape, hand hygiene and skin antiseptic. A timeout was performed prior to the initiation of the procedure. Patient positioned supine on the procedure table. Epigastric and right upper quadrant abdominal wall skin prepped and draped in the usual sterile fashion. Sonographic evaluation of the left hepatic lobe showed mildly dilated ducts. Following local lidocaine  administration, a left hepatic lobe duct was successfully accessed with a 21 gauge needle. 21 gauge needle was removed over 0.018 inch guidewire. The guidewire was advanced to the level of the common bile duct. Contrast administered through the right hepatic drain confirmed appropriate positioning within the right hepatic bile ducts and doudenum. The right-sided 18 French drain was cut and removed over 0.035 inch guidewire and replaced with an 8 French sheath. The 8 French sheath was advanced beyond the site of right hepatic and common bile duct stenosis. Two brush biopsies were performed at the stenotic segments of the common and right main hepatic ducts. Both samples were sent to pathology in sterile saline. New 12 French internal external biliary drain was inserted over  the 0.035 inch guidewire. The pigtail was formed within the doudenum. Attention was again turned to the left hepatic lobe. Transitional dilator set was inserted over the 0.018 inch guidewire. Kumpe catheter and stiff glidewire were advanced to the level of the doudenum utilizing fluoroscopic guidance through this access. Kumpe the catheter was removed and a 10 French internal external biliary drain was inserted. The distal pigtail was formed within the doudenum. Contrast administered through both drains confirmed appropriate positioning within the doudenum as  well as within the intrahepatic bile ducts. Both drains were secured to skin with silk suture and connected to bag. IMPRESSION: 1. Successful brush biopsy of the stenotic segments of the common bile and right main bile ducts. 2. Successful replacement of 12 French right hepatic internal external biliary drain. 3. Successful insertion of 10 French left hepatic internal external biliary drain. Electronically Signed   By: Aliene Lloyd M.D.   On: 02/07/2023 16:36   CT ABDOMEN W CONTRAST Result Date: 02/07/2023 CLINICAL DATA:  78 year old male with a history of biliary obstruction. The original ultrasound 12/26/2022 demonstrates evidence of acute cholecystitis, which was present on a follow-up MRI 12/29/2022. Following this a percutaneous cholecystostomy was placed 01/02/2023 and replaced 01/24/2023. Ongoing symptoms of jaundice and hyperbilirubinemia prompted a second MRI 01/24/2023. MR suggested cholangitis given some enhancement along the ductal system, undrained intrahepatic bile ducts. Internal external biliary drain was placed 01/31/2023. 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 A follow-up CT 01/31/2023 and 02/02/2023 performed, with no radiopaque gallstones accounting for biliary obstruction. The patient has had ongoing hyperbilirubinemia which has plateaued at the level of the proximally 14 and remains symptomatic. He presents for this CT study as a planning study preoperatively 4 left-sided biliary drainage. EXAM: CT ABDOMEN WITH CONTRAST TECHNIQUE: Multidetector CT imaging of the abdomen was performed using the standard protocol following bolus administration of intravenous contrast. RADIATION DOSE REDUCTION: This exam was performed according to the departmental dose-optimization program which includes automated exposure control, adjustment of the mA and/or  kV according to patient size and/or use of iterative reconstruction technique. CONTRAST:  75mL OMNIPAQUE  IOHEXOL  350 MG/ML SOLN COMPARISON:  Ultrasound 12/26/2022, MR 12/29/2022, per coli 01/02/2023, MR 01/24/2023, CT 01/31/2023, CT 02/02/2023 PTC and drainage 01/31/2023 FINDINGS: Lower chest: No acute finding of the lower chest. Minimal atelectasis and trace fluid. Hepatobiliary: Left-sided intrahepatic biliary ductal dilatation. There is associated periportal edema. There are a few small mildly dilated intrahepatic ducts of the right liver, particularly of the segment 8. Percutaneous transhepatic internal/external biliary drain via right-sided approach remains adequately position with the radiopaque marker in the bile ducts and the term in a shin of the drain in the small bowel. Percutaneous cholecystostomy tube in place within the fundus of the gallbladder. No radiopaque stones. There are no radiopaque stones identified along the course of the internal/external biliary drain. Region of hypodensity/hypoenhancement at the confluence of the left and right biliary ducts, at the liver hilum on image 19 of series 3. This region measures 13 mm x 42 mm on the axial images. No cirrhotic changes Pancreas: Attenuation/enhancement of the pancreas within normal limits. Mild ductal dilatation of the pancreatic duct. Spleen: Unremarkable Adrenals/Urinary Tract: - Right adrenal gland:  Unremarkable - Left adrenal gland: Unremarkable. - Right kidney: No hydronephrosis, nephrolithiasis, inflammation, or ureteral dilation. No focal lesion. - Left Kidney: No hydronephrosis, nephrolithiasis, inflammation, or ureteral dilation. No  focal lesion. Stomach/Bowel: - Stomach: Small hiatal hernia.  Otherwise unremarkable stomach. 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 Internal/external biliary drain terminates in the duodenum. Visualized small bowel and colon unremarkable with no distension.  Vascular/Lymphatic: Calcifications of the abdominal aorta. Mesenteric arteries and renal arteries patent. Other: None Musculoskeletal: Unchanged configuration of the visualized vertebral bodies including compression fracture of L1 favored to be chronic. No bony canal narrowing. IMPRESSION: Right-sided internal/external biliary drain in adequate position across the right-sided biliary ducts and into the duodenum. The majority of the right-sided ductal system is decompressed, with some mild residual ductal dilatation involving segment 8. Persistent left-sided intrahepatic biliary ductal dilatation. Nonspecific focus of low attenuation/hypoenhancement at the confluence of the left and right-sided biliary ducts in the liver hilum. This may reflect focal edema, however, a Klatskin tumor cannot be excluded as etiology for the ductal dilatation. Percutaneous cholecystostomy in position in the gallbladder. Aortic Atherosclerosis (ICD10-I70.0). Electronically Signed   By: Ami Bellman D.O.   On: 02/07/2023 11:21    Microbiology: Results for orders placed or performed during the hospital encounter of 03/03/23  MRSA Next Gen by PCR, Nasal     Status: None   Collection Time: 03/04/23  8:04 AM   Specimen: Nasal Mucosa; Nasal Swab  Result Value Ref Range Status   MRSA by PCR Next Gen NOT DETECTED NOT DETECTED Final    Comment: (NOTE) The GeneXpert MRSA Assay (FDA approved for NASAL specimens only), is one component of a comprehensive MRSA colonization surveillance program. It is not intended to diagnose MRSA infection nor to guide or monitor treatment for MRSA infections. Test performance is not FDA approved in patients less than 63 years old. Performed at [facility], [address] [place], [place] [address]     Labs: CBC: Recent Labs  Lab 03/03/23 2136 03/04/23 0438 03/04/23 1058 03/04/23 1651 03/04/23 2258 03/05/23 0304  WBC 6.7  --   --   --   --  8.6  NEUTROABS 5.3  --    --   --   --   --   HGB 11.4* 9.2* 10.2* 10.9* 10.6* 10.6*  HCT 34.9* 26.1* 31.8* 34.0* 31.0* 31.3*  MCV 105.8*  --   --   --   --  107.2*  PLT 213  --   --   --   --  142*   Basic Metabolic Panel: Recent Labs  Lab 03/03/23 2136 03/05/23 0304  NA 128* 130*  K 3.7 4.4  CL 87* 97*  CO2 23 23  GLUCOSE 113* 86  BUN 52* 37*  CREATININE 1.44* 0.98  CALCIUM 8.6* 8.0*   Liver Function Tests: Recent Labs  Lab 03/03/23 2136 03/05/23 0304  AST 121* 91*  ALT 99* 74*  ALKPHOS 479* 388*  BILITOT 15.5* 14.7*  PROT 7.2 5.5*  ALBUMIN  2.9* 2.2*   CBG: No results for input(s): GLUCAP in the last 168 hours.  Discharge time spent: greater than 30 minutes.  Signed: Nydia Distance, MD [facility] 03/07/2023

## 2023-03-07 NOTE — TOC Transition Note (Signed)
 Transition of Care [facility]) - Discharge Note   Patient Details  Name: Samuel Mayo MRN: [id number] Date of Birth: 1945-07-21  [facility] CM/SW Contact:  NORMAN ASPEN, LCSW Phone Number: 03/07/2023, 4:01 PM   Clinical Narrative:    Bed ready today at [facility] and family has completed paperwork.  PTAR called at 4:00pm.  RN to call report to [phone].  No further TOC needs.   Final next level of care: [facility] Barriers to Discharge: Barriers Resolved   Patient Goals and CMS Choice            Discharge Placement                Patient to be transferred to facility by: PTAR Name of family member notified: son and daughter Patient and family notified of of transfer: 03/07/23  Discharge Plan and Services Additional resources added to the After Visit Summary for                  DME Arranged: N/A DME Agency: NA                  Social Drivers of Health (SDOH) Interventions SDOH Screenings   Food Insecurity: No Food Insecurity (03/04/2023)  Housing: Low Risk  (03/04/2023)  Transportation Needs: No Transportation Needs (03/04/2023)  Utilities: Not At Risk (03/04/2023)  Alcohol Screen: Low Risk  (10/21/2022)  Depression (PHQ2-9): High Risk (01/13/2023)  Financial Resource Strain: Low Risk  (10/21/2022)  Physical Activity: Insufficiently Active (10/21/2022)  Social Connections: Moderately Isolated (03/04/2023)  Stress: Stress Concern Present (10/21/2022)  Tobacco Use: Low Risk  (03/03/2023)     Readmission Risk Interventions     No data to display

## 2023-03-11 ENCOUNTER — Telehealth: Payer: Self-pay | Admitting: Neurology

## 2023-03-11 ENCOUNTER — Telehealth: Payer: Self-pay | Admitting: Family Medicine

## 2023-03-11 NOTE — Telephone Encounter (Signed)
 Copied from CRM [id number]. Topic: General - Deceased Patient >> 03-17-23  9:31 AM Rolin D wrote: Name of caller: NATHANEL JOHN  Date of death: 15-Mar-2023,2025   Name of funeral home: n/a  Phone number of funeral home: n/a  Provider that needs to sign form: n/a  Timeline for signing: n/a

## 2023-03-11 NOTE — Telephone Encounter (Signed)
 Pt's significant other, Tammy Sours notified [facility] patient passed away on 03/16/23. Gave Ms. Drees our convalescences.

## 2023-03-11 NOTE — Telephone Encounter (Signed)
 Noted, Sympathy card will be mailed to family.

## 2023-03-14 ENCOUNTER — Other Ambulatory Visit: Payer: Self-pay

## 2023-03-14 DIAGNOSIS — C221 Intrahepatic bile duct carcinoma: Secondary | ICD-10-CM

## 2023-03-23 ENCOUNTER — Other Ambulatory Visit: Payer: Self-pay | Admitting: Family Medicine

## 2023-03-23 DIAGNOSIS — I10 Essential (primary) hypertension: Secondary | ICD-10-CM

## 2023-04-05 DEATH — deceased

## 2023-05-05 ENCOUNTER — Encounter: Payer: Medicare Other | Admitting: Family Medicine

## 2023-05-09 ENCOUNTER — Encounter: Payer: Medicare Other | Admitting: Family Medicine

## 2023-06-02 ENCOUNTER — Ambulatory Visit: Payer: Medicare Other | Admitting: Neurology
